# Patient Record
Sex: Male | Born: 1950 | Race: Black or African American | Hispanic: No | Marital: Single | Smoking: Former smoker
Health system: Southern US, Community
[De-identification: ages and names within clinical notes are randomized; demographics above are authoritative.]

## PROBLEM LIST (undated history)

## (undated) DIAGNOSIS — N189 Chronic kidney disease, unspecified: Secondary | ICD-10-CM

## (undated) DIAGNOSIS — A419 Sepsis, unspecified organism: Secondary | ICD-10-CM

## (undated) DIAGNOSIS — I10 Essential (primary) hypertension: Secondary | ICD-10-CM

## (undated) DIAGNOSIS — M1990 Unspecified osteoarthritis, unspecified site: Secondary | ICD-10-CM

## (undated) DIAGNOSIS — I82409 Acute embolism and thrombosis of unspecified deep veins of unspecified lower extremity: Secondary | ICD-10-CM

## (undated) DIAGNOSIS — R32 Unspecified urinary incontinence: Secondary | ICD-10-CM

## (undated) DIAGNOSIS — E785 Hyperlipidemia, unspecified: Secondary | ICD-10-CM

## (undated) DIAGNOSIS — F0390 Unspecified dementia without behavioral disturbance: Secondary | ICD-10-CM

## (undated) DIAGNOSIS — R0600 Dyspnea, unspecified: Secondary | ICD-10-CM

## (undated) DIAGNOSIS — E119 Type 2 diabetes mellitus without complications: Secondary | ICD-10-CM

## (undated) DIAGNOSIS — I739 Peripheral vascular disease, unspecified: Secondary | ICD-10-CM

## (undated) HISTORY — PX: EYE SURGERY: SHX253

## (undated) HISTORY — PX: TONSILLECTOMY: SUR1361

---

## 1996-09-01 HISTORY — PX: CIRCUMCISION: SUR203

## 2007-02-19 DIAGNOSIS — I10 Essential (primary) hypertension: Secondary | ICD-10-CM | POA: Insufficient documentation

## 2010-12-18 DIAGNOSIS — E669 Obesity, unspecified: Secondary | ICD-10-CM | POA: Insufficient documentation

## 2013-03-10 ENCOUNTER — Ambulatory Visit: Payer: Self-pay | Admitting: Ophthalmology

## 2013-04-04 ENCOUNTER — Ambulatory Visit: Payer: Self-pay | Admitting: Ophthalmology

## 2013-04-04 LAB — BASIC METABOLIC PANEL
Chloride: 108 mmol/L — ABNORMAL HIGH (ref 98–107)
Co2: 30 mmol/L (ref 21–32)
EGFR (African American): >60
Glucose: 98 mg/dL (ref 65–99)
Osmolality: 282 (ref 275–301)
Potassium: 4.1 mmol/L (ref 3.5–5.1)
Sodium: 140 mmol/L (ref 136–145)

## 2013-04-06 ENCOUNTER — Ambulatory Visit: Payer: Self-pay | Admitting: Ophthalmology

## 2014-07-20 DIAGNOSIS — N529 Male erectile dysfunction, unspecified: Secondary | ICD-10-CM | POA: Insufficient documentation

## 2014-12-22 NOTE — Op Note (Signed)
PATIENT NAME:  [name], [name] MR#:  [id number] DATE OF BIRTH:  Aug 12, 1951  DATE OF PROCEDURE:  04/06/2013  PROCEDURES PERFORMED:  1. Pars plana vitrectomy of the left eye.  2. Tractional retinal detachment repair of the left eye.  3. Gas exchange of the left eye.  4. Panretinal photocoagulation of the left eye.   PREOPERATIVE DIAGNOSES: 1. Severe tractional retinal detachment.  2. Proliferative diabetic retinopathy.   POSTOPERATIVE DIAGNOSES: 1. Severe tractional retinal detachment.  2. Proliferative diabetic retinopathy.   PRIMARY SURGEON: Jerry Felling. Carmyn Hamm, [name]  ANESTHESIA: General anesthesia with a supplemental retrobulbar block.   COMPLICATIONS: None.   INDICATIONS FOR PROCEDURE: The patient presented to my office with decreasing vision in the left eye. Examination revealed a tractional retinal detachment that encompassed the inferior 25% to 30% of his retina, passed the inferior arcade, threatening the fovea. There was extensive proliferative vitreoretinopathy with a shallow tractional retinal detachment superiorly as well. Risks, benefits and alternatives of the above procedure were discussed, and the patient wished to proceed. The patient delayed surgery for several weeks secondary to financial issues.   DETAILS OF PROCEDURE: After informed consent was obtained, the patient was brought into the operative suite at [facility]. The patient was placed in supine position and was induced by the anesthesia team without complication, and a retrobulbar block was performed on the left eye by the primary surgeon without complication. The left eye was prepped and draped in sterile manner. After lid speculum was inserted, a 23-gauge trocar was placed inferotemporally through displaced conjunctiva in an oblique fashion 4 mm beyond the limbus. The infusion cannula was turned on and inserted through the trocar and secured into position with Steri-Strips. Two more trocars  were placed in a similar fashion superotemporally and superonasally. The vitreous cutter and light pipe were introduced into the eye, and a core vitrectomy was performed. The retina was examined, and the tractional retinal detachment was noted to have gone through his fovea. The proliferative membranes were dissected and slowly separated from each other and peeled away. During the course of peeling, it was noted that the proliferative membranes were extremely adherent and incorporated with the retina and very large. Multiple small retinal tears were associated as the proliferative membranes were peeled away and dissected away from the rest of the vitreous body. The vitreous was incredibly adherent and caused further elevation of the retina in the periphery as the vitreous was peeled away. Vitreous was trimmed 360 degrees to the vitreous base. An air-fluid exchange was performed through one of the retinal breaks, and the retina completely flattened for 360 degrees except in the extreme posterior, with some remnant tractional fluid that was very gel-like. Endolaser was introduced, and 4 rows of laser were placed around each of the retinal tears. Endolaser was then carried for 360 degrees in a panretinal photocoagulation pattern, leaving the posterior untouched. Once the laser was completed, any remnant fluid was removed. The 15% C3F8 was used as an Systems developer, and the trocars were removed. The wounds were closed using plain 6-0 gut. The eye was pressurized with C3F8 to 15 mmHg. Dexamethasone 5 mg was given into the inferior fornix. The lid speculum was removed, and the eye was cleaned. TobraDex was placed in the eye, and a patch and shield were placed over the eye. The patient was taken to postanesthesia care with instruction to remain face down.    ____________________________ Jerry Felling. Champ Mungo, [name] mfa:OSi D: 04/06/2013 09:16:20 ET T: 04/06/2013  09:28:57 ET JOB#: [id number]  cc: Jerry FellingMatthew F.  Champ MungoAppenzeller, [name], <Dictator> [name] ELECTRONICALLY SIGNED 04/20/2013 8:48

## 2016-07-21 ENCOUNTER — Encounter: Admission: RE | Payer: Self-pay | Source: Ambulatory Visit

## 2016-07-21 ENCOUNTER — Ambulatory Visit: Admission: RE | Admit: 2016-07-21 | Payer: Medicare HMO | Source: Ambulatory Visit | Admitting: Gastroenterology

## 2016-07-21 SURGERY — COLONOSCOPY WITH PROPOFOL
Anesthesia: General

## 2016-10-03 ENCOUNTER — Inpatient Hospital Stay: Payer: Medicare HMO | Admitting: Certified Registered Nurse Anesthetist

## 2016-10-03 ENCOUNTER — Inpatient Hospital Stay
Admission: EM | Admit: 2016-10-03 | Discharge: 2016-10-13 | DRG: 853 | Disposition: A | Discharge status: Skilled Nursing Facility | Payer: Medicare HMO | Attending: Internal Medicine | Admitting: Internal Medicine

## 2016-10-03 ENCOUNTER — Encounter
Admission: EM | Disposition: A | Discharge status: Skilled Nursing Facility | Payer: Self-pay | Source: Home / Self Care | Attending: Internal Medicine

## 2016-10-03 ENCOUNTER — Emergency Department: Payer: Medicare HMO

## 2016-10-03 DIAGNOSIS — I129 Hypertensive chronic kidney disease with stage 1 through stage 4 chronic kidney disease, or unspecified chronic kidney disease: Secondary | ICD-10-CM | POA: Diagnosis present

## 2016-10-03 DIAGNOSIS — L03115 Cellulitis of right lower limb: Secondary | ICD-10-CM | POA: Diagnosis present

## 2016-10-03 DIAGNOSIS — Z833 Family history of diabetes mellitus: Secondary | ICD-10-CM

## 2016-10-03 DIAGNOSIS — Z7982 Long term (current) use of aspirin: Secondary | ICD-10-CM | POA: Diagnosis not present

## 2016-10-03 DIAGNOSIS — L0390 Cellulitis, unspecified: Secondary | ICD-10-CM

## 2016-10-03 DIAGNOSIS — E1122 Type 2 diabetes mellitus with diabetic chronic kidney disease: Secondary | ICD-10-CM | POA: Diagnosis present

## 2016-10-03 DIAGNOSIS — T445X5A Adverse effect of predominantly beta-adrenoreceptor agonists, initial encounter: Secondary | ICD-10-CM | POA: Diagnosis present

## 2016-10-03 DIAGNOSIS — A419 Sepsis, unspecified organism: Principal | ICD-10-CM | POA: Diagnosis present

## 2016-10-03 DIAGNOSIS — N183 Chronic kidney disease, stage 3 (moderate): Secondary | ICD-10-CM | POA: Diagnosis present

## 2016-10-03 DIAGNOSIS — E7800 Pure hypercholesterolemia, unspecified: Secondary | ICD-10-CM | POA: Diagnosis present

## 2016-10-03 DIAGNOSIS — Z881 Allergy status to other antibiotic agents status: Secondary | ICD-10-CM

## 2016-10-03 DIAGNOSIS — A480 Gas gangrene: Secondary | ICD-10-CM | POA: Diagnosis present

## 2016-10-03 DIAGNOSIS — E11628 Type 2 diabetes mellitus with other skin complications: Secondary | ICD-10-CM | POA: Diagnosis present

## 2016-10-03 DIAGNOSIS — Z7984 Long term (current) use of oral hypoglycemic drugs: Secondary | ICD-10-CM

## 2016-10-03 DIAGNOSIS — I472 Ventricular tachycardia: Secondary | ICD-10-CM | POA: Diagnosis present

## 2016-10-03 DIAGNOSIS — E872 Acidosis: Secondary | ICD-10-CM | POA: Diagnosis present

## 2016-10-03 DIAGNOSIS — Z87891 Personal history of nicotine dependence: Secondary | ICD-10-CM

## 2016-10-03 DIAGNOSIS — T68XXXA Hypothermia, initial encounter: Secondary | ICD-10-CM | POA: Diagnosis present

## 2016-10-03 DIAGNOSIS — E1140 Type 2 diabetes mellitus with diabetic neuropathy, unspecified: Secondary | ICD-10-CM | POA: Diagnosis present

## 2016-10-03 DIAGNOSIS — R81 Glycosuria: Secondary | ICD-10-CM | POA: Diagnosis present

## 2016-10-03 DIAGNOSIS — R21 Rash and other nonspecific skin eruption: Secondary | ICD-10-CM | POA: Diagnosis not present

## 2016-10-03 DIAGNOSIS — T39395A Adverse effect of other nonsteroidal anti-inflammatory drugs [NSAID], initial encounter: Secondary | ICD-10-CM | POA: Diagnosis present

## 2016-10-03 DIAGNOSIS — Z79899 Other long term (current) drug therapy: Secondary | ICD-10-CM | POA: Diagnosis not present

## 2016-10-03 DIAGNOSIS — N170 Acute kidney failure with tubular necrosis: Secondary | ICD-10-CM | POA: Diagnosis present

## 2016-10-03 DIAGNOSIS — Z791 Long term (current) use of non-steroidal anti-inflammatories (NSAID): Secondary | ICD-10-CM | POA: Diagnosis not present

## 2016-10-03 DIAGNOSIS — N182 Chronic kidney disease, stage 2 (mild): Secondary | ICD-10-CM | POA: Diagnosis present

## 2016-10-03 DIAGNOSIS — R6521 Severe sepsis with septic shock: Secondary | ICD-10-CM | POA: Diagnosis present

## 2016-10-03 DIAGNOSIS — E1152 Type 2 diabetes mellitus with diabetic peripheral angiopathy with gangrene: Secondary | ICD-10-CM | POA: Diagnosis present

## 2016-10-03 DIAGNOSIS — L8990 Pressure ulcer of unspecified site, unspecified stage: Secondary | ICD-10-CM | POA: Insufficient documentation

## 2016-10-03 DIAGNOSIS — I70261 Atherosclerosis of native arteries of extremities with gangrene, right leg: Secondary | ICD-10-CM | POA: Diagnosis not present

## 2016-10-03 DIAGNOSIS — L97519 Non-pressure chronic ulcer of other part of right foot with unspecified severity: Secondary | ICD-10-CM | POA: Diagnosis not present

## 2016-10-03 DIAGNOSIS — E11621 Type 2 diabetes mellitus with foot ulcer: Secondary | ICD-10-CM | POA: Diagnosis not present

## 2016-10-03 DIAGNOSIS — N179 Acute kidney failure, unspecified: Secondary | ICD-10-CM

## 2016-10-03 DIAGNOSIS — I70235 Atherosclerosis of native arteries of right leg with ulceration of other part of foot: Secondary | ICD-10-CM | POA: Diagnosis not present

## 2016-10-03 HISTORY — DX: Type 2 diabetes mellitus without complications: E11.9

## 2016-10-03 HISTORY — DX: Essential (primary) hypertension: I10

## 2016-10-03 HISTORY — PX: IRRIGATION AND DEBRIDEMENT FOOT: SHX6602

## 2016-10-03 LAB — COMPREHENSIVE METABOLIC PANEL
ALT: 30 U/L (ref 17–63)
ANION GAP: 21 — AB (ref 5–15)
AST: 72 U/L — ABNORMAL HIGH (ref 15–41)
Albumin: 2.9 g/dL — ABNORMAL LOW (ref 3.5–5.0)
Alkaline Phosphatase: 71 U/L (ref 38–126)
BUN: 120 mg/dL — ABNORMAL HIGH (ref 6–20)
CALCIUM: 8.7 mg/dL — AB (ref 8.9–10.3)
CO2: 11 mmol/L — AB (ref 22–32)
Chloride: 103 mmol/L (ref 101–111)
Creatinine, Ser: 7.5 mg/dL — ABNORMAL HIGH (ref 0.61–1.24)
GFR calc non Af Amer: 7 mL/min — ABNORMAL LOW (ref >60)
GFR, EST AFRICAN AMERICAN: 8 mL/min — AB (ref >60)
Glucose, Bld: 168 mg/dL — ABNORMAL HIGH (ref 65–99)
Potassium: 5.3 mmol/L — ABNORMAL HIGH (ref 3.5–5.1)
Sodium: 135 mmol/L (ref 135–145)
Total Bilirubin: 1.1 mg/dL (ref 0.3–1.2)
Total Protein: 7.8 g/dL (ref 6.5–8.1)

## 2016-10-03 LAB — CBC WITH DIFFERENTIAL/PLATELET
BASOS PCT: 0 %
Basophils Absolute: 0 10*3/uL (ref 0–0.1)
Eosinophils Absolute: 0 10*3/uL (ref 0–0.7)
Eosinophils Relative: 0 %
HEMATOCRIT: 35.9 % — AB (ref 40.0–52.0)
HEMOGLOBIN: 12.1 g/dL — AB (ref 13.0–18.0)
LYMPHS ABS: 0.7 10*3/uL — AB (ref 1.0–3.6)
Lymphocytes Relative: 3 %
MCH: 30 pg (ref 26.0–34.0)
MCHC: 33.6 g/dL (ref 32.0–36.0)
MCV: 89.2 fL (ref 80.0–100.0)
MONOS PCT: 9 %
Monocytes Absolute: 1.9 10*3/uL — ABNORMAL HIGH (ref 0.2–1.0)
NEUTROS ABS: 18.1 10*3/uL — AB (ref 1.4–6.5)
NEUTROS PCT: 88 %
Platelets: 336 10*3/uL (ref 150–440)
RBC: 4.03 MIL/uL — ABNORMAL LOW (ref 4.40–5.90)
RDW: 15 % — ABNORMAL HIGH (ref 11.5–14.5)
WBC: 20.7 10*3/uL — AB (ref 3.8–10.6)

## 2016-10-03 LAB — URINALYSIS, ROUTINE W REFLEX MICROSCOPIC
BACTERIA UA: NONE SEEN
Bilirubin Urine: NEGATIVE
GLUCOSE, UA: 50 mg/dL — AB
KETONES UR: NEGATIVE mg/dL
LEUKOCYTES UA: NEGATIVE
NITRITE: NEGATIVE
PH: 5 (ref 5.0–8.0)
Protein, ur: 100 mg/dL — AB
SPECIFIC GRAVITY, URINE: 1.026 (ref 1.005–1.030)

## 2016-10-03 LAB — GLUCOSE, CAPILLARY
Glucose-Capillary: 208 mg/dL — ABNORMAL HIGH (ref 65–99)
Glucose-Capillary: 203 mg/dL — ABNORMAL HIGH (ref 65–99)

## 2016-10-03 LAB — TROPONIN I: Troponin I: 0.07 ng/mL (ref <0.03)

## 2016-10-03 LAB — LACTIC ACID, PLASMA
Lactic Acid, Venous: 4.5 mmol/L (ref 0.5–1.9)
Lactic Acid, Venous: 1.2 mmol/L (ref 0.5–1.9)

## 2016-10-03 LAB — MRSA PCR SCREENING: MRSA by PCR: NEGATIVE

## 2016-10-03 SURGERY — IRRIGATION AND DEBRIDEMENT FOOT
Anesthesia: General | Site: Foot | Laterality: Right | Wound class: Dirty or Infected

## 2016-10-03 MED ORDER — PNEUMOCOCCAL VAC POLYVALENT 25 MCG/0.5ML IJ INJ: 0.5000 mL | INJECTION | INTRAMUSCULAR | Status: DC

## 2016-10-03 MED ORDER — SODIUM CHLORIDE 0.9 % IV BOLUS (SEPSIS)
500.0000 mL | Freq: Once | INTRAVENOUS | Status: AC
  Administered 2016-10-03: 500 mL via INTRAVENOUS

## 2016-10-03 MED ORDER — SODIUM CHLORIDE 0.9 % IV SOLN
INTRAVENOUS | Status: DC
  Administered 2016-10-03: 21:00:00 via INTRAVENOUS
  Administered 2016-10-04: 125 mL/h via INTRAVENOUS

## 2016-10-03 MED ORDER — LIDOCAINE HCL (PF) 2 % IJ SOLN
INTRAMUSCULAR | Status: AC
  Filled 2016-10-03: qty 2

## 2016-10-03 MED ORDER — FENTANYL CITRATE (PF) 100 MCG/2ML IJ SOLN
INTRAMUSCULAR | Status: DC | PRN
  Administered 2016-10-03: 100 ug via INTRAVENOUS

## 2016-10-03 MED ORDER — OXYCODONE HCL 5 MG PO TABS
5.0000 mg | ORAL_TABLET | ORAL | Status: DC | PRN
  Administered 2016-10-04 – 2016-10-13 (×16): 5 mg via ORAL
  Filled 2016-10-03 (×18): qty 1

## 2016-10-03 MED ORDER — FENTANYL CITRATE (PF) 100 MCG/2ML IJ SOLN
INTRAMUSCULAR | Status: AC
  Filled 2016-10-03: qty 2

## 2016-10-03 MED ORDER — SODIUM CHLORIDE 0.9 % IV BOLUS (SEPSIS): 1000.0000 mL | Freq: Once | INTRAVENOUS | Status: DC

## 2016-10-03 MED ORDER — PHENYLEPHRINE HCL 10 MG/ML IJ SOLN
INTRAMUSCULAR | Status: DC | PRN
  Administered 2016-10-03 (×2): 200 ug via INTRAVENOUS

## 2016-10-03 MED ORDER — PROPOFOL 10 MG/ML IV BOLUS
INTRAVENOUS | Status: AC
  Filled 2016-10-03: qty 20

## 2016-10-03 MED ORDER — PROPOFOL 10 MG/ML IV BOLUS
INTRAVENOUS | Status: DC | PRN
Start: 2016-10-03 — End: 2016-10-03
  Administered 2016-10-03: 200 mg via INTRAVENOUS

## 2016-10-03 MED ORDER — ACETAMINOPHEN 325 MG PO TABS
650.0000 mg | ORAL_TABLET | Freq: Four times a day (QID) | ORAL | Status: DC | PRN
  Administered 2016-10-04 – 2016-10-10 (×2): 650 mg via ORAL
  Filled 2016-10-03 (×2): qty 2

## 2016-10-03 MED ORDER — HEPARIN SODIUM (PORCINE) 5000 UNIT/ML IJ SOLN
5000.0000 [IU] | Freq: Three times a day (TID) | INTRAMUSCULAR | Status: DC
  Administered 2016-10-03 – 2016-10-13 (×29): 5000 [IU] via SUBCUTANEOUS
  Filled 2016-10-03 (×29): qty 1

## 2016-10-03 MED ORDER — ONDANSETRON HCL 4 MG PO TABS: 4.0000 mg | ORAL_TABLET | Freq: Four times a day (QID) | ORAL | Status: DC | PRN

## 2016-10-03 MED ORDER — DEXAMETHASONE SODIUM PHOSPHATE 10 MG/ML IJ SOLN
INTRAMUSCULAR | Status: AC
  Filled 2016-10-03: qty 1

## 2016-10-03 MED ORDER — VANCOMYCIN HCL IN DEXTROSE 1-5 GM/200ML-% IV SOLN
1000.0000 mg | Freq: Once | INTRAVENOUS | Status: AC
  Administered 2016-10-03: 1000 mg via INTRAVENOUS
  Filled 2016-10-03: qty 200

## 2016-10-03 MED ORDER — ATORVASTATIN CALCIUM 20 MG PO TABS
20.0000 mg | ORAL_TABLET | Freq: Every day | ORAL | Status: DC
  Administered 2016-10-03 – 2016-10-13 (×11): 20 mg via ORAL
  Filled 2016-10-03 (×11): qty 1

## 2016-10-03 MED ORDER — VANCOMYCIN HCL 10 G IV SOLR
1500.0000 mg | INTRAVENOUS | Status: DC
  Filled 2016-10-03: qty 1500

## 2016-10-03 MED ORDER — FENTANYL CITRATE (PF) 100 MCG/2ML IJ SOLN: 25.0000 ug | INTRAMUSCULAR | Status: DC | PRN

## 2016-10-03 MED ORDER — MIDAZOLAM HCL 2 MG/2ML IJ SOLN
INTRAMUSCULAR | Status: AC
  Filled 2016-10-03: qty 2

## 2016-10-03 MED ORDER — ACETAMINOPHEN 650 MG RE SUPP: 650.0000 mg | Freq: Four times a day (QID) | RECTAL | Status: DC | PRN

## 2016-10-03 MED ORDER — BUPIVACAINE HCL 0.5 % IJ SOLN
INTRAMUSCULAR | Status: DC | PRN
  Administered 2016-10-03: 30 mL

## 2016-10-03 MED ORDER — MIDAZOLAM HCL 2 MG/2ML IJ SOLN
INTRAMUSCULAR | Status: DC | PRN
  Administered 2016-10-03: 2 mg via INTRAVENOUS

## 2016-10-03 MED ORDER — SODIUM CHLORIDE 0.9 % IV BOLUS (SEPSIS): 500.0000 mL | Freq: Once | INTRAVENOUS | Status: DC

## 2016-10-03 MED ORDER — PIPERACILLIN-TAZOBACTAM 3.375 G IVPB 30 MIN
3.3750 g | Freq: Once | INTRAVENOUS | Status: AC
  Administered 2016-10-03: 3.375 g via INTRAVENOUS
  Filled 2016-10-03: qty 50

## 2016-10-03 MED ORDER — INFLUENZA VAC SPLIT QUAD 0.5 ML IM SUSY
0.5000 mL | PREFILLED_SYRINGE | INTRAMUSCULAR | Status: DC
Start: 2016-10-04 — End: 2016-10-13
  Filled 2016-10-03: qty 0.5

## 2016-10-03 MED ORDER — EPHEDRINE SULFATE 50 MG/ML IJ SOLN
INTRAMUSCULAR | Status: DC | PRN
  Administered 2016-10-03 (×2): 15 mg via INTRAVENOUS

## 2016-10-03 MED ORDER — SODIUM CHLORIDE 0.9 % IV BOLUS (SEPSIS)
1000.0000 mL | Freq: Once | INTRAVENOUS | Status: AC
  Administered 2016-10-03: 1000 mL via INTRAVENOUS

## 2016-10-03 MED ORDER — CHLORHEXIDINE GLUCONATE 4 % EX LIQD: 60.0000 mL | Freq: Once | CUTANEOUS | Status: DC

## 2016-10-03 MED ORDER — SODIUM CHLORIDE 0.9 % IV BOLUS (SEPSIS)
500.0000 mL | Freq: Once | INTRAVENOUS | Status: AC
Start: 2016-10-03 — End: 2016-10-03
  Administered 2016-10-03: 500 mL via INTRAVENOUS

## 2016-10-03 MED ORDER — VASOPRESSIN 20 UNIT/ML IV SOLN
INTRAVENOUS | Status: DC | PRN
  Administered 2016-10-03 (×6): 2 [IU] via INTRAVENOUS

## 2016-10-03 MED ORDER — VASOPRESSIN 20 UNIT/ML IV SOLN
INTRAVENOUS | Status: AC
  Filled 2016-10-03: qty 1

## 2016-10-03 MED ORDER — DOCUSATE SODIUM 100 MG PO CAPS
100.0000 mg | ORAL_CAPSULE | Freq: Two times a day (BID) | ORAL | Status: DC
  Administered 2016-10-03 – 2016-10-13 (×18): 100 mg via ORAL
  Filled 2016-10-03 (×19): qty 1

## 2016-10-03 MED ORDER — POVIDONE-IODINE 10 % EX SWAB: 2.0000 "application " | Freq: Once | CUTANEOUS | Status: DC

## 2016-10-03 MED ORDER — LACTATED RINGERS IV SOLN
INTRAVENOUS | Status: DC | PRN
  Administered 2016-10-03: 17:00:00 via INTRAVENOUS

## 2016-10-03 MED ORDER — ONDANSETRON HCL 4 MG/2ML IJ SOLN
INTRAMUSCULAR | Status: AC
  Filled 2016-10-03: qty 2

## 2016-10-03 MED ORDER — LIDOCAINE HCL (CARDIAC) 20 MG/ML IV SOLN
INTRAVENOUS | Status: DC | PRN
  Administered 2016-10-03: 100 mg via INTRAVENOUS

## 2016-10-03 MED ORDER — BISACODYL 5 MG PO TBEC: 5.0000 mg | DELAYED_RELEASE_TABLET | Freq: Every day | ORAL | Status: DC | PRN

## 2016-10-03 MED ORDER — ASPIRIN EC 81 MG PO TBEC
81.0000 mg | DELAYED_RELEASE_TABLET | Freq: Every day | ORAL | Status: DC
  Administered 2016-10-04 – 2016-10-13 (×10): 81 mg via ORAL
  Filled 2016-10-03 (×10): qty 1

## 2016-10-03 MED ORDER — ONDANSETRON HCL 4 MG/2ML IJ SOLN: 4.0000 mg | Freq: Four times a day (QID) | INTRAMUSCULAR | Status: DC | PRN

## 2016-10-03 MED ORDER — TRAZODONE HCL 50 MG PO TABS
25.0000 mg | ORAL_TABLET | Freq: Every evening | ORAL | Status: DC | PRN
  Administered 2016-10-05: 25 mg via ORAL
  Filled 2016-10-03: qty 1

## 2016-10-03 MED ORDER — PIPERACILLIN-TAZOBACTAM 3.375 G IVPB
3.3750 g | Freq: Two times a day (BID) | INTRAVENOUS | Status: DC
  Administered 2016-10-03 – 2016-10-05 (×4): 3.375 g via INTRAVENOUS
  Filled 2016-10-03 (×4): qty 50

## 2016-10-03 MED ORDER — ONDANSETRON HCL 4 MG/2ML IJ SOLN: 4.0000 mg | Freq: Once | INTRAMUSCULAR | Status: DC | PRN

## 2016-10-03 MED ORDER — SODIUM CHLORIDE 0.9 % IV BOLUS (SEPSIS)
1000.0000 mL | Freq: Once | INTRAVENOUS | Status: AC
Start: 2016-10-03 — End: 2016-10-03
  Administered 2016-10-03: 1000 mL via INTRAVENOUS

## 2016-10-03 MED ORDER — ONDANSETRON HCL 4 MG/2ML IJ SOLN
INTRAMUSCULAR | Status: DC | PRN
  Administered 2016-10-03: 4 mg via INTRAVENOUS

## 2016-10-03 SURGICAL SUPPLY — 62 items
BANDAGE ACE 4X5 VEL STRL LF (GAUZE/BANDAGES/DRESSINGS) ×2 IMPLANT
BANDAGE STRETCH 3X4.1 STRL (GAUZE/BANDAGES/DRESSINGS) ×2 IMPLANT
BLADE OSC/SAGITTAL MD 5.5X18 (BLADE) IMPLANT
BLADE OSCILLATING/SAGITTAL (BLADE)
BLADE SW THK.38XMED LNG THN (BLADE) IMPLANT
BNDG COHESIVE 4X5 TAN STRL (GAUZE/BANDAGES/DRESSINGS) ×2 IMPLANT
BNDG COHESIVE 6X5 TAN STRL LF (GAUZE/BANDAGES/DRESSINGS) ×2 IMPLANT
BNDG ESMARK 4X12 TAN STRL LF (GAUZE/BANDAGES/DRESSINGS) ×2 IMPLANT
BNDG GAUZE 4.5X4.1 6PLY STRL (MISCELLANEOUS) ×2 IMPLANT
CANISTER SUCT 1200ML W/VALVE (MISCELLANEOUS) ×2 IMPLANT
CANISTER SUCT 3000ML (MISCELLANEOUS) ×2 IMPLANT
CUFF TOURN 18 STER (MISCELLANEOUS) ×2 IMPLANT
CUFF TOURN DUAL PL 12 NO SLV (MISCELLANEOUS) ×2 IMPLANT
DRAPE FLUOR MINI C-ARM 54X84 (DRAPES) IMPLANT
DRAPE XRAY CASSETTE 23X24 (DRAPES) IMPLANT
DRESSING ALLEVYN 4X4 (MISCELLANEOUS) IMPLANT
DURAPREP 26ML APPLICATOR (WOUND CARE) ×2 IMPLANT
ELECT REM PT RETURN 9FT ADLT (ELECTROSURGICAL) ×2
ELECTRODE REM PT RTRN 9FT ADLT (ELECTROSURGICAL) ×1 IMPLANT
GAUZE PACKING 1/4X5YD (GAUZE/BANDAGES/DRESSINGS) ×2 IMPLANT
GAUZE PACKING IODOFORM 1X5 (MISCELLANEOUS) ×2 IMPLANT
GAUZE PETRO XEROFOAM 1X8 (MISCELLANEOUS) ×2 IMPLANT
GAUZE SPONGE 4X4 12PLY STRL (GAUZE/BANDAGES/DRESSINGS) ×2 IMPLANT
GAUZE STRETCH 2X75IN STRL (MISCELLANEOUS) ×2 IMPLANT
GLOVE BIO SURGEON STRL SZ7.5 (GLOVE) ×2 IMPLANT
GLOVE INDICATOR 8.0 STRL GRN (GLOVE) ×2 IMPLANT
GOWN STRL REUS W/ TWL LRG LVL3 (GOWN DISPOSABLE) ×2 IMPLANT
GOWN STRL REUS W/TWL LRG LVL3 (GOWN DISPOSABLE) ×2
GOWN STRL REUS W/TWL MED LVL3 (GOWN DISPOSABLE) ×4 IMPLANT
HANDLE YANKAUER SUCT BULB TIP (MISCELLANEOUS) ×2 IMPLANT
HANDPIECE INTERPULSE COAX TIP (DISPOSABLE) ×1
HANDPIECE VERSAJET DEBRIDEMENT (MISCELLANEOUS) ×2 IMPLANT
IV NS 1000ML (IV SOLUTION) ×1
IV NS 1000ML BAXH (IV SOLUTION) ×1 IMPLANT
KIT DRSG VAC SLVR GRANUFM (MISCELLANEOUS) ×2 IMPLANT
KIT RM TURNOVER STRD PROC AR (KITS) ×2 IMPLANT
LABEL OR SOLS (LABEL) ×2 IMPLANT
NEEDLE FILTER BLUNT 18X 1/2SAF (NEEDLE) ×1
NEEDLE FILTER BLUNT 18X1 1/2 (NEEDLE) ×1 IMPLANT
NEEDLE HYPO 25X1 1.5 SAFETY (NEEDLE) ×2 IMPLANT
NS IRRIG 1000ML POUR BTL (IV SOLUTION) ×4 IMPLANT
NS IRRIG 500ML POUR BTL (IV SOLUTION) ×2 IMPLANT
PACK EXTREMITY ARMC (MISCELLANEOUS) ×2 IMPLANT
PAD ABD DERMACEA PRESS 5X9 (GAUZE/BANDAGES/DRESSINGS) ×2 IMPLANT
RASP SM TEAR CROSS CUT (RASP) IMPLANT
SET HNDPC FAN SPRY TIP SCT (DISPOSABLE) ×1 IMPLANT
SOL .9 NS 3000ML IRR  AL (IV SOLUTION) ×1
SOL .9 NS 3000ML IRR UROMATIC (IV SOLUTION) ×1 IMPLANT
SOL PREP PVP 2OZ (MISCELLANEOUS) ×2
SOLUTION PREP PVP 2OZ (MISCELLANEOUS) ×1 IMPLANT
STOCKINETTE IMPERVIOUS 9X36 MD (GAUZE/BANDAGES/DRESSINGS) ×2 IMPLANT
SUT ETHILON 2 0 FS 18 (SUTURE) ×4 IMPLANT
SUT ETHILON 4-0 (SUTURE) ×1
SUT ETHILON 4-0 FS2 18XMFL BLK (SUTURE) ×1
SUT VIC AB 3-0 SH 27 (SUTURE) ×1
SUT VIC AB 3-0 SH 27X BRD (SUTURE) ×1 IMPLANT
SUT VIC AB 4-0 FS2 27 (SUTURE) ×2 IMPLANT
SUTURE ETHLN 4-0 FS2 18XMF BLK (SUTURE) ×1 IMPLANT
SWAB CULTURE AMIES ANAERIB BLU (MISCELLANEOUS) ×2 IMPLANT
SYR 3ML LL SCALE MARK (SYRINGE) ×2 IMPLANT
SYRINGE 10CC LL (SYRINGE) ×4 IMPLANT
WND VAC CANISTER 500ML (MISCELLANEOUS) ×2 IMPLANT

## 2016-10-03 NOTE — Transfer of Care (Signed)
Immediate Anesthesia Transfer of Care Note  Patient: [name]  Procedure(s) Performed: Procedure(s): IRRIGATION AND DEBRIDEMENT FOOT (Right)  Patient Location: PACU  Anesthesia Type:General  Level of Consciousness: patient cooperative and responds to stimulation  Airway & Oxygen Therapy: Patient Spontanous Breathing and Patient connected to nasal cannula oxygen  Post-op Assessment: Report given to RN and Post -op Vital signs reviewed and stable  Post vital signs: Reviewed and stable  Last Vitals:  Vitals:   10/03/16 1826 10/03/16 1833  BP: (!) 81/41 98/61  Pulse: 88   Resp: (!) 23   Temp: 36.3 C     Last Pain:  Vitals:   10/03/16 1826  TempSrc: Tympanic  PainSc: Asleep         Complications: No apparent anesthesia complications

## 2016-10-03 NOTE — Op Note (Signed)
Operative note   Surgeon:Anetria Harwick    Assistant:none    Preop diagnosis:Gas producing infection with necrosis right foot    Postop diagnosis:Gas gangene right 5th toe and forefoot.    Procedure:I & D right foot and partial 5th ray amputation. Placement of wound VAC right foot    JYN:WGNFAOZEBL:minimal    Anesthesia:local and general    Hemostasis:none    Specimen:Gangrenous right 5th toe and tissue    Complications:none    Operative indications:[name] is an 66 y.o. that presents today for surgical intervention.  The risks/benefits/alternatives/complications have been discussed and consent has been given.    Procedure:  Patient was brought into the OR and placed on the operating table in thesupine position. After anesthesia was obtained theright lower extremity was prepped and draped in usual sterile fashion.  Attention was directed to the lateral aspect of the right foot where the fifth toe was noted to be a screw necrotic at this time with a large bullous lesion on the dorsal and plantar aspect of the foot. A lateral incision was made overlying the fifth MTPJ and fifth metatarsal. Full-thickness incision was taken down and there was noted to be severe necrotic tissue with a 5 necrotic tissue both dorsal and plantar to the fifth MTPJ into the fifth toe. The fifth toe was noted be completely gangrenous at this point and was resected from the surgical field in toto. Further dissection noted severe necrosis of the entire soft tissue surrounding the fifth metatarsal and an osteotomy was made into the base of the fifth metatarsal and approximate two thirds of the fifth metatarsal was then removed. There was still noted to be a large amount of necrotic tissue both dorsally and plantarly across the midfoot. The plantar necrosis carried almost to the first metatarsal. A versa jet was used to excisionally debride the severe necrotic tissue. Dorsally a large bullous had been removed and noted  pre-necrotic tissue of the skin with necrosis of the soft tissue overlying the lesser metatarsals to approximately the second metatarsal was noted. A versa jet was used to remove all of this necrotic tissue at this time. Minimal bleeding was noted and no tourniquet was used. The wound was flushed with a liter of saline with a bulb syringe. At this time necrotic tissue on the lateral aspect was resected and a silver impregnated wound VAC dressing was applied with the wound VAC under 125 mmHg with continuous pressure.    Patient tolerated the procedure and anesthesia well.  Was transported from the OR to the PACU with all vital signs stable and vascular status intact.   Patient is at very high risk of further amputation due to severe amount of necrotic tissue and fascia. Minimal circulation was noted.

## 2016-10-03 NOTE — Anesthesia Postprocedure Evaluation (Signed)
Anesthesia Post Note  Patient: [name]  Procedure(s) Performed: Procedure(s) (LRB): IRRIGATION AND DEBRIDEMENT FOOT (Right)  Patient location during evaluation: PACU Anesthesia Type: General Level of consciousness: awake and alert Pain management: pain level controlled Vital Signs Assessment: post-procedure vital signs reviewed and stable Respiratory status: spontaneous breathing, nonlabored ventilation, respiratory function stable and patient connected to nasal cannula oxygen Cardiovascular status: blood pressure returned to baseline and stable Postop Assessment: no signs of nausea or vomiting Anesthetic complications: no Comments: Patient with initial hypotension on arrival to the PACU, but patient was arousable and answered questions appropriately.  This was likely secondary to septicemia from his foot infection and recent debridement.  His hypotension did improve with fluid boluses of 500mL x2.  He was stable on discharge from the PACU.     Last Vitals:  Vitals:   10/03/16 2010 10/03/16 2020  BP: (!) 87/44 (!) 84/49  Pulse: 97 97  Resp: (!) 24 (!) 23  Temp:  36.4 C    Last Pain:  Vitals:   10/03/16 2020  TempSrc: Axillary  PainSc:                  Lenard SimmerAndrew Gracen Southwell

## 2016-10-03 NOTE — Anesthesia Procedure Notes (Signed)
Procedure Name: LMA Insertion Date/Time: 10/03/2016 5:25 PM Performed by: Marlana SalvageJESSUP, Toluwani Ruder Pre-anesthesia Checklist: Patient identified, Emergency Drugs available, Suction available, Patient being monitored and Timeout performed Patient Re-evaluated:Patient Re-evaluated prior to inductionOxygen Delivery Method: Circle system utilized Preoxygenation: Pre-oxygenation with 100% oxygen Intubation Type: IV induction Ventilation: Mask ventilation without difficulty LMA: LMA inserted LMA Size: 5.0 Number of attempts: 2 Placement Confirmation: positive ETCO2 and breath sounds checked- equal and bilateral Dental Injury: Teeth and Oropharynx as per pre-operative assessment

## 2016-10-03 NOTE — Consult Note (Signed)
ORTHOPAEDIC CONSULTATION  REQUESTING PHYSICIAN: Jerry AntisKevin Paduchowski, MD  Chief Complaint: right foot infection   HPI: [name] is a 66 y.o. male who complains of  Right foot infection..  Started hurting on Sunday. Seen by PCP Tuesday and started on po clindamycin.  Continued to worsened and presented to ED today.  Pt hypthermic with elevated WBC and lactic acid.  Xray showed gas on soft tissue.  Past Medical History:  Diagnosis Date  . Diabetes mellitus without complication (HCC)   . Hypertension    History reviewed. No pertinent surgical history. Social History   Social History  . Marital status: Single    Spouse name: N/A  . Number of children: N/A  . Years of education: N/A   Social History Main Topics  . Smoking status: Former Games developermoker  . Smokeless tobacco: Never Used  . Alcohol use No  . Drug use: Unknown  . Sexual activity: Not Asked   Other Topics Concern  . None   Social History Narrative  . None   Family History  Problem Relation Age of Onset  . Family history unknown: Yes   Not on File Prior to Admission medications   Medication Sig Start Date End Date Taking? Authorizing Provider  aspirin EC 81 MG tablet Take 81 mg by mouth daily.   Yes Historical Provider, MD  atorvastatin (LIPITOR) 20 MG tablet Take 20 mg by mouth daily.   Yes Historical Provider, MD  clindamycin (CLEOCIN) 300 MG capsule Take 300 mg by mouth 4 (four) times daily.   Yes Historical Provider, MD  lisinopril-hydrochlorothiazide (PRINZIDE,ZESTORETIC) 20-25 MG tablet Take 1 tablet by mouth daily.   Yes Historical Provider, MD  metFORMIN (GLUCOPHAGE) 1000 MG tablet Take 1,000 mg by mouth 2 (two) times daily with a meal.   Yes Historical Provider, MD   Dg Foot Complete Right  Result Date: 10/03/2016 CLINICAL DATA:  Swelling right foot. EXAM: RIGHT FOOT COMPLETE - 3+ VIEW COMPARISON:  No recent prior. FINDINGS: Prominent tissue swelling is noted particularly prominent over the lateral and  plantar aspect of the right foot. Associated soft tissue air is noted. These findings suggest soft tissue infection. No clearcut focal bony abnormality identified. If osteomyelitis is of clinical concern MRI can be obtained. No evidence of fracture or dislocation. Peripheral vascular calcification. IMPRESSION: 1. Extensive soft tissue swelling with prominent amount of air noted particularly along the lateral and plantar aspect of the right foot consistent with soft tissue infection. No focal underlying bony abnormality. If osteomyelitis is of clinical concern MRI should be considered. 2. Peripheral vascular disease . These results will be called to the ordering clinician or representative by the Radiologist Assistant, and communication documented in the PACS or zVision Dashboard. Electronically Signed   By: Maisie Fushomas  Register   On: 10/03/2016 13:50    Positive ROS: All other systems have been reviewed and were otherwise negative with the exception of those mentioned in the HPI and as above.  12 point ROS was performed.  Physical Exam: General: Alert and oriented.  No apparent distress.  Vascular:  Left foot:Dorsalis Pedis:  diminished Posterior Tibial:  absent  Right foot: Dorsalis Pedis:  absent Posterior Tibial:  absent  Neuro:absent protective sensation.  Derm:Large bullous lesion to right lateral foot with draining infection.    Ortho/MS: Diffuse edema to right foot.   Assessment: Gas producing diabetic foot infection  Plan: Plan for urgent I & D with gas producing infection. Pt has been NPO all day. D/W pt  and consent given. Medicine to perform admission.    [name], DPM Cell [phone]   10/03/2016 3:07 PM

## 2016-10-03 NOTE — Anesthesia Post-op Follow-up Note (Cosign Needed)
Anesthesia QCDR form completed.        

## 2016-10-03 NOTE — ED Provider Notes (Signed)
[facility] Emergency Department Provider Note  Time seen: 1:11 PM  I have reviewed the triage vital signs and the nursing notes.   HISTORY  Chief Complaint Weakness    HPI [name] is a 66 y.o. male with a past medical history of diabetes, hypertension, presents to the emergency department with generalized weakness. According to the patient he first noticed a red and swollen foot on Monday (4 days ago), he saw his physician on Tuesday was prescribed clindamycin. He has been taking clindamycin but states the swelling and redness continued to worsen. Today he states generalized weakness.Upon arrival the patient is somewhat somnolent but able to answer questions and follow commands appropriately. Patient has a core temperature of 94.5., Heart rate of 96.  Past Medical History:  Diagnosis Date  . Diabetes mellitus without complication (HCC)   . Hypertension     There are no active problems to display for this patient.   History reviewed. No pertinent surgical history.  Prior to Admission medications   Not on File    Not on File  Family History  Problem Relation Age of Onset  . Family history unknown: Yes    Social History Social History  Substance Use Topics  . Smoking status: Former Games developer  . Smokeless tobacco: Never Used  . Alcohol use No    Review of Systems Constitutional: Negative for fever.Positive for chills per patient Cardiovascular: Negative for chest pain. Respiratory: Negative for shortness of breath. Gastrointestinal: Negative for abdominal pain, vomiting and diarrhea Musculoskeletal: Right foot pain and redness and swelling Neurological: Negative for headaches, focal weakness or numbness. Positive for generalized weakness. 10-point ROS otherwise negative.  ____________________________________________   PHYSICAL EXAM:  VITAL SIGNS: ED Triage Vitals  Enc Vitals Group     BP 10/03/16 1301 118/82     Pulse Rate  10/03/16 1301 96     Resp 10/03/16 1301 20     Temp 10/03/16 1301 (!) 94.5 F (34.7 C)     Temp Source 10/03/16 1301 Rectal     SpO2 10/03/16 1301 100 %     Weight 10/03/16 1302 260 lb (117.9 kg)     Height 10/03/16 1302 5\' 11"  (1.803 m)     Head Circumference --      Peak Flow --      Pain Score --      Pain Loc --      Pain Edu? --      Excl. in GC? --     Constitutional: Alert and oriented. Well appearing and in no distress. Eyes: Normal exam ENT   Head: Normocephalic and atraumatic   Mouth/Throat: Mucous membranes are moist. Cardiovascular: Normal rate, regular rhythm. No murmur Respiratory: Normal respiratory effort without tachypnea nor retractions. Breath sounds are clear Gastrointestinal: Soft and nontender. No distention.  Musculoskeletal: Nontender with normal range of motion in all extremities. Neurologic:  Normal speech and language. No gross focal neurologic deficits  Skin:  Skin is warm, dry and intact.  Psychiatric: Mood and affect are normal. Speech and behavior are normal.   ____________________________________________    EKG  EKG reviewed and interpreted, so shows normal sinus rhythm at 92 bpm, narrow QRS, normal axis, largely normal intervals besides a slightly prolonged QTC of 500 ms, nonspecific but no concerning ST changes.  ____________________________________________    RADIOLOGY  X-ray shows significant swelling with gas.  ____________________________________________   INITIAL IMPRESSION / ASSESSMENT AND PLAN / ED COURSE  Pertinent labs & imaging  results that were available during my care of the patient were reviewed by me and considered in my medical decision making (see chart for details).  Patient presents to the emergent department with generalized weakness. Patient is found to be hypothermic 94 by rectal thermometer. Heart rate of 96 meeting Sirs criteria for likely right lower extremity cellulitis. Patient's right foot is  erythematous swollen with several areas of blistering and darkened skin especially to the fifth digit. Patient has lymphatic streaking. We'll obtain imaging to rule out gas. Patient will be treated for cellulitis with IV antibiotics, warm fluids, bear hugger. Currently awaiting lab results. Fully anticipated admission to the hospital for sepsis due to right lower extremity cellulitis.  X-ray concerning for a gas-forming organism. I discussed the patient with Dr. Ether GriffinsFowler who states he will be in later to see the patient and likely perform an incision and drainage. Patient's white blood cell count is 20,000, lactate greater than 4. Patient will receive 30 cc/kg of warm IV fluids. We will continue with bear hugger until normotensive.   CRITICAL CARE Performed by: Minna AntisPADUCHOWSKI, Camani Sesay   Total critical care time: 60 minutes  Critical care time was exclusive of separately billable procedures and treating other patients.  Critical care was necessary to treat or prevent imminent or life-threatening deterioration.  Critical care was time spent personally by me on the following activities: development of treatment plan with patient and/or surrogate as well as nursing, discussions with consultants, evaluation of patient's response to treatment, examination of patient, obtaining history from patient or surrogate, ordering and performing treatments and interventions, ordering and review of laboratory studies, ordering and review of radiographic studies, pulse oximetry and re-evaluation of patient's condition.   ____________________________________________   FINAL CLINICAL IMPRESSION(S) / ED DIAGNOSES  Sepsis Right lower extremity cellulitis    Minna AntisKevin Brallan Denio, MD 10/03/16 1433

## 2016-10-03 NOTE — ED Triage Notes (Signed)
Pt to ED from home via [facility] c/o weakness. Per EMS pt had a fall and was too weak to get up. Pt has hx of cellulitis to right foot and has started antibiotics. Per pt he has been feeling weak for 1 week. Pt alert and oriented in no acute distress at this time.

## 2016-10-03 NOTE — Progress Notes (Signed)
CH responded to an OR for an AD and prayer. CH educated Pt on AD. Pt will consider the material with the intent of completion tomorrow. 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 CH challenged him to seek opportunities to "teach" during his stay at [facility]. Pt seemed pleased with this prospect. CH provided the ministry of empathetic listening and prayer. CH is available for follow up as needed.    10/03/16 2100  Clinical Encounter Type  Visited With Patient  Visit Type Initial;Spiritual support;Critical Care  Referral From Nurse  Consult/Referral To Chaplain  Spiritual Encounters  Spiritual Needs Literature;Prayer  Advance Directives (For Healthcare)  Does Patient Have a Medical Advance Directive? No  Would patient like information on creating a medical advance directive? Yes (Inpatient - patient requests chaplain consult to create a medical advance directive)  Mental Health Advance Directives  Does Patient Have a Mental Health Advance Directive? No  Would patient like information on creating a mental health advance directive? No - Patient declined

## 2016-10-03 NOTE — Anesthesia Preprocedure Evaluation (Signed)
Anesthesia Evaluation  Patient identified by MRN, date of birth, ID band Patient awake    Reviewed: Allergy & Precautions, H&P , NPO status , Patient's Chart, lab work & pertinent test results, reviewed documented beta blocker date and time   History of Anesthesia Complications Negative for: history of anesthetic complications  Airway Mallampati: II  TM Distance: >3 FB Neck ROM: full    Dental  (+) Poor Dentition, Missing, Dental Advidsory Given   Pulmonary neg pulmonary ROS, former smoker,           Cardiovascular Exercise Tolerance: Good hypertension, (-) angina(-) CAD, (-) Past MI, (-) Cardiac Stents and (-) CABG (-) dysrhythmias (-) Valvular Problems/Murmurs     Neuro/Psych negative neurological ROS  negative psych ROS   GI/Hepatic negative GI ROS, Neg liver ROS,   Endo/Other  diabetes  Renal/GU negative Renal ROS  negative genitourinary   Musculoskeletal   Abdominal   Peds  Hematology negative hematology ROS (+)   Anesthesia Other Findings Past Medical History: No date: Diabetes mellitus without complication (HCC) No date: Hypertension   Reproductive/Obstetrics negative OB ROS                             Anesthesia Physical Anesthesia Plan  ASA: III  Anesthesia Plan: General   Post-op Pain Management:    Induction:   Airway Management Planned:   Additional Equipment:   Intra-op Plan:   Post-operative Plan:   Informed Consent: I have reviewed the patients History and Physical, chart, labs and discussed the procedure including the risks, benefits and alternatives for the proposed anesthesia with the patient or authorized representative who has indicated his/her understanding and acceptance.   Dental Advisory Given  Plan Discussed with: Anesthesiologist, CRNA and Surgeon  Anesthesia Plan Comments:         Anesthesia Quick Evaluation

## 2016-10-03 NOTE — Progress Notes (Signed)
Pharmacy Antibiotic Note  [name] is a 66 y.o. male admitted on 10/03/2016 with sepsis secondary to  cellulitis. Patient has been taking clindamycin PO outpatient with no improvement.  Pharmacy has been consulted for Vancomycin and Zosyn dosing.  According to ED note: X-Ray is concerning for gas forming organism.   Plan: DW: 92kg   Scr:7.5  Patient has already received Zosyn 3.375 iV x 1 dose and Vancomycin 1gm IV x1 dose.  Patients current CrCl calculated at 12.876ml/min and Scr is 7.5. Could not find any previous labs for the patient to see what baseline Scr was, but ED nurse says patient does not have PMH of ESRD or HD prior to admission.    Based on current renal function will give another dose of  Vancomycin 1gm  (total: vanc 2gm) and start patient on regimen of Vancomycin 1500mg  IV every 48hrs. Will monitor renal function daily and adjust dose as needed. May consider dosing based on random levels if needed.   Will start patient on Zosyn 3.375 IV EI every 12 hours. Monitor renal function daily and increase to every 8 hours if  CrCL improves to  >7420ml/min.   Possible I/D later today/tomorrow  Height: 5\' 11"  (180.3 cm) Weight: 260 lb (117.9 kg) IBW/kg (Calculated) : 75.3  Temp (24hrs), Avg:94.5 F (34.7 C), Min:94.5 F (34.7 C), Max:94.5 F (34.7 C)   Recent Labs Lab 10/03/16 1317  WBC 20.7*  CREATININE 7.50*  LATICACIDVEN 4.5*    Estimated Creatinine Clearance: 12.6 mL/min (by C-G formula based on SCr of 7.5 mg/dL (H)).    Not on File  Antimicrobials this admission: 2/2 vancomycin  >>  2/2 Zosyn >>   Dose adjustments this admission:  Microbiology results: 2/2 BCx: sent 2/2 UCx: sent  Thank you for allowing pharmacy to be a part of this patient's care.  Gardner CandleSheema M Vieva Brummitt, PharmD, BCPS Clinical Pharmacist 10/03/2016 2:58 PM

## 2016-10-03 NOTE — H&P (Signed)
[facility] [place] at [facility]   PATIENT NAME: [name]    MR#:  [id number]  DATE OF BIRTH:  [birth year]  DATE OF ADMISSION:  10/03/2016  PRIMARY CARE PHYSICIAN: No PCP Per Patient   REQUESTING/REFERRING PHYSICIAN: Dr. Minna Antis  CHIEF COMPLAINT: Generalized weakness    Chief Complaint  Patient presents with  . Weakness    HISTORY OF PRESENT ILLNESS:  [name]  is a 66 y.o. male with a known history ofHypertension, diabetes mellitus type 2 comes in because of generalized weakness, swelling on the bottom of the right foot. Patient has been having redness, swelling on the part of the right foot for about 4 days, went to primary doctor and was given clindamycin. Patient continued to have swelling, redness, pain,promped him to come to the emergency room. In the emergency room temperature 94.5, heart rate 96, hypotensive. Noted to have a blister on the dorsum of the right foot involving right little toe,swelling of therightfoot area of cellulitis on the plantar aspect of the right foot. X-ray of the right foot concerning for gas gangrene. Seen by podiatry Dr. Ether Griffins recommended incision and drainage this evening, starting him on a broad-spectrum antibiotics. Patient received vancomycin, Zosyn here.  PAST MEDICAL HISTORY:   Past Medical History:  Diagnosis Date  . Diabetes mellitus without complication (HCC)   . Hypertension     PAST SURGICAL HISTOIRY:  History reviewed. No pertinent surgical history.  SOCIAL HISTORY:   Social History  Substance Use Topics  . Smoking status: Former Games developer  . Smokeless tobacco: Never Used  . Alcohol use No    FAMILY HISTORY:   Family History  Problem Relation Age of Onset  . Family history unknown: Yes   Family history significant for mother has diabetes. DRUG ALLERGIES:  Not on File NKA  REVIEW OF SYSTEMS:  CONSTITUTIONAL: Hypothermia, generalized weakness.Marland Kitchen  EYES: No blurred or  double vision.  EARS, NOSE, AND THROAT: No tinnitus or ear pain.  RESPIRATORY: No cough, shortness of breath, wheezing or hemoptysis.  CARDIOVASCULAR: No chest pain, orthopnea, edema.  GASTROINTESTINAL: No nausea, vomiting, diarrhea or abdominal pain.  GENITOURINARY: No dysuria, hematuria.  ENDOCRINE: No polyuria, nocturia,  HEMATOLOGY: No anemia, easy bruising or bleeding SKIN: No rash or lesion. MUSCULOSKELETAL: Swelling, redness of the right foot, blister noted on the dorsum of the right foot. Area of swelling on the plantar aspect of the right foot.   NEUROLOGIC: No tingling, numbness, weakness.  PSYCHIATRY: No anxiety or depression.   MEDICATIONS AT HOME:   Prior to Admission medications   Medication Sig Start Date End Date Taking? Authorizing Provider  aspirin EC 81 MG tablet Take 81 mg by mouth daily.   Yes Historical Provider, MD  atorvastatin (LIPITOR) 20 MG tablet Take 20 mg by mouth daily.   Yes Historical Provider, MD  clindamycin (CLEOCIN) 300 MG capsule Take 300 mg by mouth 4 (four) times daily.   Yes Historical Provider, MD  lisinopril-hydrochlorothiazide (PRINZIDE,ZESTORETIC) 20-25 MG tablet Take 1 tablet by mouth daily.   Yes Historical Provider, MD  metFORMIN (GLUCOPHAGE) 1000 MG tablet Take 1,000 mg by mouth 2 (two) times daily with a meal.   Yes Historical Provider, MD      VITAL SIGNS:  Blood pressure 112/65, pulse 89, temperature (!) 94.5 F (34.7 C), temperature source Rectal, resp. rate 18, height 5\' 11"  (1.803 m), weight 117.9 kg (260 lb), SpO2 98 %.  PHYSICAL EXAMINATION:  GENERAL:  66 y.o.-year-old patient lying  in the bed with no acute distress.  EYES: Pupils equal, round, reactive to light and accommodation. No scleral icterus. Extraocular muscles intact.  HEENT: Head atraumatic, normocephalic. Oropharynx and nasopharynx clear.  NECK:  Supple, no jugular venous distention. No thyroid enlargement, no tenderness.  LUNGS: Normal breath sounds bilaterally,  no wheezing, rales,rhonchi or crepitation. No use of accessory muscles of respiration.  CARDIOVASCULAR: S1, S2 normal. No murmurs, rubs, or gallops.  ABDOMEN: Soft, nontender, nondistended. Bowel sounds present. No organomegaly or mass.  EXTREMITIES: No pedal edema, cyanosis, or clubbing.  NEUROLOGIC: Cranial nerves II through XII are intact. Muscle strength 5/5 in all extremities. Sensation intact. Gait not checked.  PSYCHIATRIC: The patient is alert and oriented x 3.  SKIN:swelling ,redeness of dorsum of right foot. Patient noted to have a blister, black discoloration of the right fifth digit, swelling and tenderness on the plantar aspect of the right foot.  LABORATORY PANEL:   CBC  Recent Labs Lab 10/03/16 1317  WBC 20.7*  HGB 12.1*  HCT 35.9*  PLT 336   ------------------------------------------------------------------------------------------------------------------  Chemistries   Recent Labs Lab 10/03/16 1317  NA 135  K 5.3*  CL 103  CO2 11*  GLUCOSE 168*  BUN 120*  CREATININE 7.50*  CALCIUM 8.7*  AST 72*  ALT 30  ALKPHOS 71  BILITOT 1.1   ------------------------------------------------------------------------------------------------------------------  Cardiac Enzymes  Recent Labs Lab 10/03/16 1317  TROPONINI 0.07*   ------------------------------------------------------------------------------------------------------------------  RADIOLOGY:  Dg Foot Complete Right  Result Date: 10/03/2016 CLINICAL DATA:  Swelling right foot. EXAM: RIGHT FOOT COMPLETE - 3+ VIEW COMPARISON:  No recent prior. FINDINGS: Prominent tissue swelling is noted particularly prominent over the lateral and plantar aspect of the right foot. Associated soft tissue air is noted. These findings suggest soft tissue infection. No clearcut focal bony abnormality identified. If osteomyelitis is of clinical concern MRI can be obtained. No evidence of fracture or dislocation. Peripheral vascular  calcification. IMPRESSION: 1. Extensive soft tissue swelling with prominent amount of air noted particularly along the lateral and plantar aspect of the right foot consistent with soft tissue infection. No focal underlying bony abnormality. If osteomyelitis is of clinical concern MRI should be considered. 2. Peripheral vascular disease . These results will be called to the ordering clinician or representative by the Radiologist Assistant, and communication documented in the PACS or zVision Dashboard. Electronically Signed   By: Maisie Fus  Register   On: 10/03/2016 13:50    EKG:   Orders placed or performed during the hospital encounter of 10/03/16  . ED EKG 12-Lead  . ED EKG 12-Lead  . EKG 12-Lead  . EKG 12-Lead    IMPRESSION AND PLAN:  #1.66 year old male patient with diabetes comes in with swelling and redness of the right foot. Failed outpatient therapy with clindamycin.  #1 septic shock secondary to gas gangrene: Continue aggressive hydration received 2 L of normal saline in the emergency room, patient still hypotensive started on aggressive IV hydration with normal saline at 1 50 cc an hour, admitted to telemetry. #2 right foot cellulitis with gas gangrene, admitted to telemetry, started on IV vancomycin, Zosyn, seen by podiatry patient will be scheduled for incision and debridement today night. #3 diabetes mellitus type 2 with anion gap of 20 but blood sugar is 168.npo,chem 7 in 4 hrs,. Continue aggressive hydration #4 acute renal failure creatinine of 7 hold metformin, continue aggressive hydration, monitor urine output, consult nephrology. #5 hypothermia secondary to septic shock: Patient has been having a, follow blood cultures, continue  IV antibiotics.    All the records are reviewed and case discussed with ED provider. Management plans discussed with the patient, family and they are in agreement.  CODE STATUS:full  TOTAL TIME TAKING CARE OF THIS PATIENT: 55 minutes.     Katha HammingKONIDENA,Naidelin Gugliotta M.D on 10/03/2016 at 3:16 PM  Between 7am to 6pm - Pager - [phone]  After 6pm go to [email or web address] - password [email or web address]  [facility]  Office  [phone]  CC: Primary care physician; No PCP Per Patient  Note: This dictation was prepared with Dragon dictation along with smaller phrase technology. Any transcriptional errors that result from this process are unintentional.

## 2016-10-04 ENCOUNTER — Inpatient Hospital Stay: Payer: Medicare HMO

## 2016-10-04 DIAGNOSIS — L8990 Pressure ulcer of unspecified site, unspecified stage: Secondary | ICD-10-CM | POA: Insufficient documentation

## 2016-10-04 DIAGNOSIS — I70235 Atherosclerosis of native arteries of right leg with ulceration of other part of foot: Secondary | ICD-10-CM

## 2016-10-04 LAB — BASIC METABOLIC PANEL
Anion gap: 13 (ref 5–15)
BUN: 103 mg/dL — ABNORMAL HIGH (ref 6–20)
CO2: 13 mmol/L — ABNORMAL LOW (ref 22–32)
Calcium: 7.7 mg/dL — ABNORMAL LOW (ref 8.9–10.3)
Chloride: 110 mmol/L (ref 101–111)
Creatinine, Ser: 5.93 mg/dL — ABNORMAL HIGH (ref 0.61–1.24)
GFR calc Af Amer: 10 mL/min — ABNORMAL LOW (ref >60)
GFR calc non Af Amer: 9 mL/min — ABNORMAL LOW (ref >60)
Glucose, Bld: 242 mg/dL — ABNORMAL HIGH (ref 65–99)
Potassium: 5 mmol/L (ref 3.5–5.1)
Sodium: 136 mmol/L (ref 135–145)

## 2016-10-04 LAB — CBC
HCT: 30.2 % — ABNORMAL LOW (ref 40.0–52.0)
Hemoglobin: 10 g/dL — ABNORMAL LOW (ref 13.0–18.0)
MCH: 29.4 pg (ref 26.0–34.0)
MCHC: 33.3 g/dL (ref 32.0–36.0)
MCV: 88.5 fL (ref 80.0–100.0)
Platelets: 306 10*3/uL (ref 150–440)
RBC: 3.41 MIL/uL — ABNORMAL LOW (ref 4.40–5.90)
RDW: 15 % — ABNORMAL HIGH (ref 11.5–14.5)
WBC: 16.8 10*3/uL — ABNORMAL HIGH (ref 3.8–10.6)

## 2016-10-04 LAB — URINE CULTURE: CULTURE: NO GROWTH

## 2016-10-04 MED ORDER — SODIUM BICARBONATE 8.4 % IV SOLN
INTRAVENOUS | Status: DC
  Administered 2016-10-04 – 2016-10-07 (×7): via INTRAVENOUS
  Filled 2016-10-04 (×10): qty 150

## 2016-10-04 MED ORDER — OXYCODONE HCL 5 MG PO TABS
5.0000 mg | ORAL_TABLET | Freq: Once | ORAL | Status: AC
  Administered 2016-10-04: 5 mg via ORAL

## 2016-10-04 NOTE — Consult Note (Signed)
[facility] CONSULT NOTE    Date: 10/04/2016                  Patient Name:  [name]  MRN: [id number]  DOB: Nov 27, 1950  Age / Sex: 66 y.o., male         PCP: No PCP Per Patient                 Service Requesting Consult: Hospitalist                 Reason for Consult: Acute renal failure            History of Present Illness: Patient is a 66 y.o. male with [name], hypertension, [name], [name], who was admitted to [facility] on 10/03/2016 for evaluation of right foot infection.  Over the past week the patient has had significant swelling and redness of the right foot. He has been seen by podiatry and has a wound VAC in place at the moment and is on broad-spectrum antibiotics. We have been consulted for the evaluation management of acute renal failure.  From prior records it appears that the patient has history of mild chronic kidney disease. His baseline creatinine is 1.1. He has seen nephrology in the remote past.  Upon presentation this time his BUN was 120 with a creatinine of 7.5 and EGFR of 8. With IV fluid hydration however his BUN is now down to 103 with a creatinine of 5.9 and EGFR of 10.  He has been taking at least 3 packets of BC powders daily.   Medications: Outpatient medications: Prescriptions Prior to Admission  Medication Sig Dispense Refill Last Dose  . aspirin EC 81 MG tablet Take 81 mg by mouth daily.   10/02/2016 at 0800  . atorvastatin (LIPITOR) 20 MG tablet Take 20 mg by mouth daily.   10/02/2016 at 2000  . clindamycin (CLEOCIN) 300 MG capsule Take 300 mg by mouth 4 (four) times daily.   10/02/2016 at 2000  . lisinopril-hydrochlorothiazide (PRINZIDE,ZESTORETIC) 20-25 MG tablet Take 1 tablet by mouth daily.   10/02/2016 at 0800  . metFORMIN (GLUCOPHAGE) 1000 MG tablet Take 1,000 mg by mouth 2 (two) times daily with a meal.   10/02/2016 at 1800     Current medications: Current Facility-Administered Medications  Medication Dose Route Frequency Provider Last Rate Last Dose  . 0.9 %  sodium chloride infusion   Intravenous Continuous Epifanio Lesches, MD 125 mL/hr at 10/04/16 0600    . acetaminophen (TYLENOL) tablet 650 mg  650 mg Oral Q6H PRN Epifanio Lesches, MD       Or  . acetaminophen (TYLENOL) suppository 650 mg  650 mg Rectal Q6H PRN Epifanio Lesches, MD      . aspirin EC tablet 81 mg  81 mg Oral Daily Epifanio Lesches, MD      . atorvastatin (LIPITOR) tablet 20 mg  20 mg Oral Daily Epifanio Lesches, MD   20 mg at 10/03/16 2214  . bisacodyl (DULCOLAX) EC tablet 5 mg  5 mg Oral Daily PRN Epifanio Lesches, MD      . docusate sodium (COLACE) capsule 100 mg  100 mg Oral BID Epifanio Lesches, MD   100 mg at 10/03/16 2214  . heparin injection 5,000 Units  5,000 Units Subcutaneous Q8H Epifanio Lesches, MD   5,000 Units at 10/04/16 7939  . Influenza  vac split quadrivalent PF (FLUARIX) injection 0.5 mL  0.5 mL Intramuscular Tomorrow-1000 Epifanio Lesches, MD      . ondansetron (ZOFRAN) tablet 4 mg  4 mg Oral Q6H PRN Epifanio Lesches, MD       Or  . ondansetron (ZOFRAN) injection 4 mg  4 mg Intravenous Q6H PRN Epifanio Lesches, MD      . oxyCODONE (Oxy IR/ROXICODONE) immediate release tablet 5 mg  5 mg Oral Q4H PRN Epifanio Lesches, MD      . piperacillin-tazobactam (ZOSYN) IVPB 3.375 g  3.375 g Intravenous Q12H Sheema M Hallaji, RPH   3.375 g at 10/03/16 2214  . pneumococcal 23 valent vaccine (PNU-IMMUNE) injection 0.5 mL  0.5 mL Intramuscular Tomorrow-1000 Epifanio Lesches, MD      . traZODone (DESYREL) tablet 25 mg  25 mg Oral QHS PRN Epifanio Lesches, MD      . Derrill Memo ON 10/05/2016] vancomycin (VANCOCIN) 1,500 mg in sodium chloride 0.9 % 500 mL IVPB  1,500 mg Intravenous Q48H Sheema M Hallaji, RPH          Allergies: Not on File    Past Medical History: Past Medical History:  Diagnosis  Date  . Diabetes mellitus without complication ([place])   . Hypertension      Past Surgical History: History reviewed. No pertinent surgical history.   Family History: Family History  Problem Relation Age of Onset  . Family history unknown: Yes     Social History: Social History   Social History  . Marital status: Single    Spouse name: N/A  . Number of children: N/A  . Years of education: N/A   Occupational History  . Not on file.   Social History Main Topics  . Smoking status: Former Research scientist (life sciences)  . Smokeless tobacco: Never Used  . Alcohol use No  . Drug use: Unknown  . Sexual activity: Not on file   Other Topics Concern  . Not on file   Social History Narrative  . No narrative on file     Review of Systems: As per HPI  Vital Signs: Blood pressure (!) 91/55, pulse 87, temperature 97.7 F (36.5 C), resp. rate (!) 24, height 5' 11"  (1.803 m), weight 117 kg (257 lb 15 oz), SpO2 96 %.  Weight trends: Filed Weights   10/03/16 1302 10/03/16 2020 10/04/16 0400  Weight: 117.9 kg (260 lb) 117 kg (257 lb 15 oz) 117 kg (257 lb 15 oz)    Physical Exam: General: NAD, resting in bed  Head: Normocephalic, atraumatic.  Eyes: Anicteric, EOMI  Nose: Mucous membranes moist, not inflammed, nonerythematous.  Throat: Oropharynx nonerythematous, no exudate appreciated.   Neck: Supple, trachea midline.  Lungs:  Normal respiratory effort. Clear to auscultation BL without crackles or wheezes.  Heart: RRR. S1 and S2 normal without gallop, murmur, or rubs.  Abdomen:  BS normoactive. Soft, Nondistended, non-tender.  No masses or organomegaly.  Extremities: 2+ b/l LE edema, wound vac in place in right foot  Neurologic: A&O X3, Motor strength is 5/5 in the all 4 extremities  Skin: No visible rashes, scars.    Lab results: Basic Metabolic Panel:  Recent Labs Lab 10/03/16 1317 10/04/16 0638  NA 135 136  K 5.3* 5.0  CL 103 110  CO2 11* 13*  GLUCOSE 168* 242*  BUN 120* 103*   CREATININE 7.50* 5.93*  CALCIUM 8.7* 7.7*    Liver Function Tests:  Recent Labs Lab 10/03/16 1317  AST 72*  ALT 30  ALKPHOS 71  BILITOT 1.1  PROT 7.8  ALBUMIN 2.9*   No results for input(s): LIPASE, AMYLASE in the last 168 hours. No results for input(s): AMMONIA in the last 168 hours.  CBC:  Recent Labs Lab 10/03/16 1317 10/04/16 0638  WBC 20.7* 16.8*  NEUTROABS 18.1*  --   HGB 12.1* 10.0*  HCT 35.9* 30.2*  MCV 89.2 88.5  PLT 336 306    Cardiac Enzymes:  Recent Labs Lab 10/03/16 1317  TROPONINI 0.07*    BNP: Invalid input(s): POCBNP  CBG:  Recent Labs Lab 10/03/16 1834 10/03/16 2049  GLUCAP 208* 55*    Microbiology: Results for orders placed or performed during the hospital encounter of 10/03/16  Blood Culture (routine x 2)     Status: None (Preliminary result)   Collection Time: 10/03/16  1:17 PM  Result Value Ref Range Status   Specimen Description BLOOD RIGHT FOREARM  Final   Special Requests BOTTLES DRAWN AEROBIC AND ANAEROBIC AER11ML ANA9ML  Final   Culture NO GROWTH < 24 HOURS  Final   Report Status PENDING  Incomplete  Blood Culture (routine x 2)     Status: None (Preliminary result)   Collection Time: 10/03/16  1:18 PM  Result Value Ref Range Status   Specimen Description BLOOD LEFT FOREARM  Final   Special Requests BOTTLES DRAWN AEROBIC AND ANAEROBIC AER5ML ANA4ML  Final   Culture NO GROWTH < 24 HOURS  Final   Report Status PENDING  Incomplete  Aerobic/Anaerobic Culture (surgical/deep wound)     Status: None (Preliminary result)   Collection Time: 10/03/16  5:38 PM  Result Value Ref Range Status   Specimen Description FOOT right foot abscess  Final   Special Requests NONE  Final   Gram Stain   Final    FEW WBC PRESENT, PREDOMINANTLY PMN ABUNDANT GRAM POSITIVE COCCI IN CLUSTERS ABUNDANT GRAM NEGATIVE RODS ABUNDANT GRAM POSITIVE COCCI IN PAIRS ABUNDANT GRAM POSITIVE RODS Performed at [facility], [place] [address]  [place], [place] [address]    Culture PENDING  Incomplete   Report Status PENDING  Incomplete  MRSA PCR Screening     Status: None   Collection Time: 10/03/16  8:52 PM  Result Value Ref Range Status   MRSA by PCR NEGATIVE NEGATIVE Final    Comment:        The GeneXpert MRSA Assay (FDA approved for NASAL specimens only), is one component of a comprehensive MRSA colonization surveillance program. It is not intended to diagnose MRSA infection nor to guide or monitor treatment for MRSA infections.     Coagulation Studies: No results for input(s): LABPROT, INR in the last 72 hours.  Urinalysis:  Recent Labs  10/03/16 1342  COLORURINE AMBER*  LABSPEC 1.026  PHURINE 5.0  GLUCOSEU 50*  HGBUR SMALL*  BILIRUBINUR NEGATIVE  KETONESUR NEGATIVE  PROTEINUR 100*  NITRITE NEGATIVE  LEUKOCYTESUR NEGATIVE      Imaging: Dg Foot Complete Right  Result Date: 10/03/2016 CLINICAL DATA:  Swelling right foot. EXAM: RIGHT FOOT COMPLETE - 3+ VIEW COMPARISON:  No recent prior. FINDINGS: Prominent tissue swelling is noted particularly prominent over the lateral and plantar aspect of the right foot. Associated soft tissue air is noted. These findings suggest soft tissue infection. No clearcut focal bony abnormality identified. If osteomyelitis is of clinical concern MRI can be obtained. No evidence of fracture or dislocation. Peripheral vascular calcification. IMPRESSION: 1. Extensive soft tissue swelling with prominent amount of air noted particularly along the lateral and plantar aspect  of the right foot consistent with soft tissue infection. No focal underlying bony abnormality. If osteomyelitis is of clinical concern MRI should be considered. 2. Peripheral vascular disease . These results will be called to the ordering clinician or representative by the Radiologist Assistant, and communication documented in the PACS or zVision Dashboard. Electronically Signed   By: Marcello Moores  Register   On: 10/03/2016 13:50       Assessment & Plan: Pt is a 66 y.o. male with [name], hypertension, [name], [name], who was admitted to [facility] on 10/03/2016 for evaluation of right foot infection.   1.  Acute renal failure, likely related to sepsis/BC powder ingestion/ACEi use 2.  CKD stage II, baseline Cr 1.1. 3.  Metabolic acidosis. 4.  Hypertension.  Plan:  We were asked to see the patient for evaluation management of acute renal failure. Patient has known chronic kidney disease with a baseline creatinine of 1.1. His acute renal failure now is likely attributable to right foot infection with sepsis, BC powder ingestion at home, and history of ACE inhibitor use. Patient undergoing IV fluid hydration. We will switch the patient to bicarbonate drip as his serum bicarbonate is quite low at 13. No urgent indication for dialysis at the moment however we may need to consider this if renal function were to worsen.

## 2016-10-04 NOTE — Progress Notes (Signed)
Pharmacy Antibiotic Note  [name] is a 66 y.o. male admitted on 10/03/2016 with sepsis secondary to  cellulitis. Patient has been taking clindamycin PO outpatient with no improvement.  Pharmacy has been consulted for Vancomycin and Zosyn dosing.  According to ED note: X-Ray is concerning for gas forming organism.   Plan: DW: 92kg   Scr:7.5  Patient has already received Zosyn 3.375 iV x 1 dose and Vancomycin 1gm IV x1 dose.  Patients current CrCl calculated at 12.536ml/min and Scr is 7.5. Could not find any previous labs for the patient to see what baseline Scr was, but ED nurse says patient does not have PMH of ESRD or HD prior to admission.    Based on current renal function will give another dose of  Vancomycin 1gm  (total: vanc 2gm) and start patient on regimen of Vancomycin 1500mg  IV every 48hrs. Will monitor renal function daily and adjust dose as needed. May consider dosing based on random levels if needed.   Will start patient on Zosyn 3.375 IV EI every 12 hours. Monitor renal function daily and increase to every 8 hours if  CrCL improves to  >1120ml/min.   2/3:  Scr improved to 5.93, Crcl 15.9 ml/min. ( however Crcl still < 20 ml/min ).  -Will continue EI Zosyn 3.375gm IV Q12h. -Due to unstable/changing renal fxn will dose per  Vancomycin Level, until Crcl > 20 ml/min and Scr more  stable. Vancomycin trough ordered for 2/4 at 1400 (~48  hrs after 1st Vancomycin dose).   Goal trough 15-7120mcg/ml.  Continue to Follow Scr/renal fxn.      Height: 5\' 11"  (180.3 cm) Weight: 257 lb 15 oz (117 kg) IBW/kg (Calculated) : 75.3  Temp (24hrs), Avg:97 F (36.1 C), Min:94.5 F (34.7 C), Max:97.7 F (36.5 C)   Recent Labs Lab 10/03/16 1317 10/03/16 2008 10/04/16 0638  WBC 20.7*  --  16.8*  CREATININE 7.50*  --  5.93*  LATICACIDVEN 4.5* 1.2  --     Estimated Creatinine Clearance: 15.9 mL/min (by C-G formula based on SCr of 5.93 mg/dL (H)).    Not on File  Antimicrobials this  admission: 2/2 vancomycin  >>  2/2 Zosyn >>   Dose adjustments this admission:  Microbiology results: 2/2 BCx: sent 2/2 UCx: sent 2/2 Wound cx foot= +GPC, +GNR, + GPR  Thank you for allowing pharmacy to be a part of this patient's care.  Angelique BlonderMerrill,Priti Consoli A, PharmD, BCPS Clinical Pharmacist 10/04/2016 8:50 AM

## 2016-10-04 NOTE — Consult Note (Signed)
Consult Note  Patient name: [name] MRN: [id number] DOB: 1951-08-21 Sex: male  Consulting Physician:  Dr. Ether Griffins  Reason for Consult:  Chief Complaint  Patient presents with  . Weakness    HISTORY OF PRESENT ILLNESS: 66 year old male admitted with weakness.  He was found to have a right foot infection for which he went to the OR with Dr. Ether Griffins yesterday for I&D  And partial right 5th ray amputation.  OR findings noted poor capillary bleeding and vascular was consulted.  The patient has a history of type 2 DM and HTN.  He is a former smoker.  He is on a statin for hypercholesterolemia.   PAST MEDICAL HISTORY:       Past Medical History:  Diagnosis Date  . Diabetes mellitus without complication (HCC)   . Hypertension     PAST SURGICAL HISTOIRY:  History reviewed. No pertinent surgical history.  SOCIAL HISTORY:       Social History  Substance Use Topics  . Smoking status: Former Games developer  . Smokeless tobacco: Never Used  . Alcohol use No    FAMILY HISTORY:        Family History  Problem Relation Age of Onset  . Family history unknown: Yes   Family history significant for mother has diabetes. DRUG ALLERGIES:  Not on File NKA  REVIEW OF SYSTEMS:  CONSTITUTIONAL: Hypothermia, generalized weakness.Marland Kitchen  EYES: No blurred or double vision.  EARS, NOSE, AND THROAT: No tinnitus or ear pain.  RESPIRATORY: No cough, shortness of breath, wheezing or hemoptysis.  CARDIOVASCULAR: No chest pain, orthopnea, edema.  GASTROINTESTINAL: No nausea, vomiting, diarrhea or abdominal pain.  GENITOURINARY: No dysuria, hematuria.  ENDOCRINE: No polyuria, nocturia,  HEMATOLOGY: No anemia, easy bruising or bleeding SKIN: No rash or lesion. MUSCULOSKELETAL: Swelling, redness of the right foot, blister noted on the dorsum of the right foot. Area of swelling on the plantar aspect of the right foot.   NEUROLOGIC: No tingling, numbness, weakness.    PSYCHIATRY: No anxiety or depression.   MEDICATIONS AT HOME:          Prior to Admission medications   Medication Sig Start Date End Date Taking? Authorizing Provider  aspirin EC 81 MG tablet Take 81 mg by mouth daily.   Yes Historical Provider, MD  atorvastatin (LIPITOR) 20 MG tablet Take 20 mg by mouth daily.   Yes Historical Provider, MD  clindamycin (CLEOCIN) 300 MG capsule Take 300 mg by mouth 4 (four) times daily.   Yes Historical Provider, MD  lisinopril-hydrochlorothiazide (PRINZIDE,ZESTORETIC) 20-25 MG tablet Take 1 tablet by mouth daily.   Yes Historical Provider, MD  metFORMIN (GLUCOPHAGE) 1000 MG tablet Take 1,000 mg by mouth 2 (two) times daily with a meal.   Yes Historical Provider, MD      PHYSICAL EXAMINATION: General: The patient appears their stated age.  Vital signs are BP (!) 91/55   Pulse 87   Temp 97.7 F (36.5 C)   Resp (!) 24   Ht 5\' 11"  (1.803 m)   Wt 257 lb 15 oz (117 kg)   SpO2 96%   BMI 35.98 kg/m  Pulmonary: Respirations are non-labored HEENT:  No gross abnormalities Abdomen: Soft and non-tender  Musculoskeletal: There are no major deformities.   Neurologic: No focal weakness or paresthesias are detected, Skin: There are no ulcer or rashes noted. Psychiatric: The patient has normal affect. Cardiovascular: There is a regular rate and rhythm.  He  has a palpable right femoral pulse but no palpable pedal pulses.  1+ edema to the right foot   Assessment:  Vascular insufficiency in the setting of a diabetic right foot infection Plan: The patient will need further workup including angiography to define his anatomy and to determine if he has any options for revascularization.  I discussed this with the patient and his sister.  Unfortunatly he is in acute renal failure and contrast for angiography will exacerbate his renal disease.  I would like to see his renal function improve before proceeding with angiography.  Dr. Gilda CreaseSchnier will follow up  on MOnday     V. Charlena CrossWells Keiasha Diep IV, M.D. [facility] Office: [phone] Pager:  [phone]

## 2016-10-04 NOTE — Progress Notes (Signed)
[facility] [place] [facility]   PATIENT NAME: [name]    MR#:  [id number]  DATE OF BIRTH:  Mar 21, 1951  SUBJECTIVE:admitted yesterday for septic shock secondary to gas gangreneof the right fifth toe. Status post is additional and drainage, fifth today amputation by podiatry yesterday evening. Patient also had wound VAC. Transfer to ICU for possible need for pressors. But patient never required vasopressors to help with blood pressure instead  continued on aggressive hydration. Patient says that he feels little better today.  CHIEF COMPLAINT:   Chief Complaint  Patient presents with  . Weakness    REVIEW OF SYSTEMS:    Review of Systems  Constitutional: Negative for chills and fever.  HENT: Negative for hearing loss.   Eyes: Negative for blurred vision, double vision and photophobia.  Respiratory: Negative for cough, hemoptysis and shortness of breath.   Cardiovascular: Negative for palpitations, orthopnea and leg swelling.  Gastrointestinal: Negative for abdominal pain, diarrhea and vomiting.  Genitourinary: Negative for dysuria and urgency.  Musculoskeletal: Negative for myalgias and neck pain.  Skin: Negative for rash.  Neurological: Negative for dizziness, focal weakness, seizures, weakness and headaches.  Psychiatric/Behavioral: Negative for memory loss. The patient does not have insomnia.    Wound vac present to right foot. Nutrition:  Tolerating Diet: Tolerating PT:      DRUG ALLERGIES:  Not on File  VITALS:  Blood pressure (!) 91/55, pulse 87, temperature 97.7 F (36.5 C), resp. rate (!) 24, height 5\' 11"  (1.803 m), weight 117 kg (257 lb 15 oz), SpO2 96 %.  PHYSICAL EXAMINATION:   Physical Exam  GENERAL:  66 y.o.-year-old patient lying in the bed with no acute distress.  EYES: Pupils equal, round, reactive to light and accommodation. No scleral icterus. Extraocular muscles intact.  HEENT: Head atraumatic, normocephalic.  Oropharynx and nasopharynx clear.  NECK:  Supple, no jugular venous distention. No thyroid enlargement, no tenderness.  LUNGS: Normal breath sounds bilaterally, no wheezing, rales,rhonchi or crepitation. No use of accessory muscles of respiration.  CARDIOVASCULAR: S1, S2 normal. No murmurs, rubs, or gallops.  ABDOMEN: Soft, nontender, nondistended. Bowel sounds present. No organomegaly or mass.  EXTREMITIES: No pedal edema, cyanosis, or clubbing.  NEUROLOGIC: Cranial nerves II through XII are intact. Muscle strength 5/5 in all extremities. Sensation intact. Gait not checked.  PSYCHIATRIC: The patient is alert and oriented x 3.  SKIN:s/p wound vac.to right foot.Marland Kitchen.    LABORATORY PANEL:   CBC  Recent Labs Lab 10/04/16 0638  WBC 16.8*  HGB 10.0*  HCT 30.2*  PLT 306   ------------------------------------------------------------------------------------------------------------------  Chemistries   Recent Labs Lab 10/03/16 1317 10/04/16 0638  NA 135 136  K 5.3* 5.0  CL 103 110  CO2 11* 13*  GLUCOSE 168* 242*  BUN 120* 103*  CREATININE 7.50* 5.93*  CALCIUM 8.7* 7.7*  AST 72*  --   ALT 30  --   ALKPHOS 71  --   BILITOT 1.1  --    ------------------------------------------------------------------------------------------------------------------  Cardiac Enzymes  Recent Labs Lab 10/03/16 1317  TROPONINI 0.07*   ------------------------------------------------------------------------------------------------------------------  RADIOLOGY:  Dg Foot Complete Right  Result Date: 10/03/2016 CLINICAL DATA:  Swelling right foot. EXAM: RIGHT FOOT COMPLETE - 3+ VIEW COMPARISON:  No recent prior. FINDINGS: Prominent tissue swelling is noted particularly prominent over the lateral and plantar aspect of the right foot. Associated soft tissue air is noted. These findings suggest soft tissue infection. No clearcut focal bony abnormality identified. If osteomyelitis is of clinical  concern  MRI can be obtained. No evidence of fracture or dislocation. Peripheral vascular calcification. IMPRESSION: 1. Extensive soft tissue swelling with prominent amount of air noted particularly along the lateral and plantar aspect of the right foot consistent with soft tissue infection. No focal underlying bony abnormality. If osteomyelitis is of clinical concern MRI should be considered. 2. Peripheral vascular disease . These results will be called to the ordering clinician or representative by the Radiologist Assistant, and communication documented in the PACS or zVision Dashboard. 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 WBC coming down. Patient never required pressors, transferred the patient to telemetry today continue IV hydration with bicarbonate. Continue  Wound vac,follow wound culture results,will consult ID   #2,acute renal failure secondary to septic shock, BC powder ingestion, ACE inhibitor's use: METABOLIC ACIDOSIS: BICARBONATE 13: PATIENT ON BICARBONATE DRIP AT THIS TIME.  3.poor circulation in right foot;vascular consulted'     All the records are reviewed and case discussed with Care Management/Social Workerr. Management plans discussed with the patient, family and they are in agreement.  CODE STATUS: full  TOTAL TIME TAKING CARE OF THIS PATIENT:35 minutes.   POSSIBLE D/C IN 3-4 DAYS, DEPENDING ON CLINICAL CONDITION.   Katha Hamming M.D on 10/04/2016 at 1:06 PM  Between 7am to 6pm - Pager - [phone]  After 6pm go to [email or web address] - password [email or web address] [facility]  [facility]  Office  [phone]  CC: Primary care  physician; No PCP Per Patient

## 2016-10-04 NOTE — Progress Notes (Signed)
Daily Progress Note   Subjective  - 1 Day Post-Op  F./u right foot I & D.  No pain.  Feels somewhat better  Objective Vitals:   10/04/16 0600 10/04/16 0700 10/04/16 0800 10/04/16 0900  BP: 112/60 (!) 99/52 (!) 100/56 (!) 91/55  Pulse: 86 82 83 87  Resp: 17 (!) 24 (!) 23 (!) 24  Temp:  97.7 F (36.5 C)    TempSrc:      SpO2: 94% 96% 96% 96%  Weight:      Height:        Physical Exam: Wound vac intact.  Moderate drainage.  Laboratory CBC    Component Value Date/Time   WBC 16.8 (H) 10/04/2016 0638   HGB 10.0 (L) 10/04/2016 0638   HCT 30.2 (L) 10/04/2016 0638   PLT 306 10/04/2016 0638    BMET    Component Value Date/Time   NA 136 10/04/2016 0638   NA 140 04/04/2013 1238   K 5.0 10/04/2016 0638   K 4.1 04/04/2013 1238   CL 110 10/04/2016 0638   CL 108 (H) 04/04/2013 1238   CO2 13 (L) 10/04/2016 0638   CO2 30 04/04/2013 1238   GLUCOSE 242 (H) 10/04/2016 0638   GLUCOSE 98 04/04/2013 1238   BUN 103 (H) 10/04/2016 0638   BUN 19 (H) 04/04/2013 1238   CREATININE 5.93 (H) 10/04/2016 0638   CREATININE 0.87 04/04/2013 1238   CALCIUM 7.7 (L) 10/04/2016 0638   CALCIUM 8.8 04/04/2013 1238   GFRNONAA 9 (L) 10/04/2016 0638   GFRNONAA >60 04/04/2013 1238   GFRAA 10 (L) 10/04/2016 0638   GFRAA >60 04/04/2013 1238    Assessment/Planning: 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  Removed 5th toe and metatarsal. Minimal bleeding during procedure.  D/W pt the extent of tissue damage and likely need for further debridment  Consulted vascular surgery.  Possible revascularization next week.  WBC improved.  Waiting on culture.  Will likely need long term IV antibiotics.  Recommend ID consult to follow for outpt IV antibiotics     Irean HongFowler, Londen Bok A  10/04/2016, 3:27 PM

## 2016-10-05 LAB — CBC
HCT: 30.4 % — ABNORMAL LOW (ref 40.0–52.0)
Hemoglobin: 10.1 g/dL — ABNORMAL LOW (ref 13.0–18.0)
MCH: 29 pg (ref 26.0–34.0)
MCHC: 33.2 g/dL (ref 32.0–36.0)
MCV: 87.2 fL (ref 80.0–100.0)
PLATELETS: 351 10*3/uL (ref 150–440)
RBC: 3.48 MIL/uL — ABNORMAL LOW (ref 4.40–5.90)
RDW: 15.1 % — AB (ref 11.5–14.5)
WBC: 11.8 10*3/uL — AB (ref 3.8–10.6)

## 2016-10-05 LAB — BASIC METABOLIC PANEL
Anion gap: 11 (ref 5–15)
BUN: 87 mg/dL — AB (ref 6–20)
CO2: 19 mmol/L — ABNORMAL LOW (ref 22–32)
CREATININE: 3.88 mg/dL — AB (ref 0.61–1.24)
Calcium: 8.3 mg/dL — ABNORMAL LOW (ref 8.9–10.3)
Chloride: 110 mmol/L (ref 101–111)
GFR calc Af Amer: 17 mL/min — ABNORMAL LOW (ref >60)
GFR, EST NON AFRICAN AMERICAN: 15 mL/min — AB (ref >60)
Glucose, Bld: 345 mg/dL — ABNORMAL HIGH (ref 65–99)
Potassium: 4.3 mmol/L (ref 3.5–5.1)
SODIUM: 140 mmol/L (ref 135–145)

## 2016-10-05 LAB — GLUCOSE, CAPILLARY
Glucose-Capillary: 322 mg/dL — ABNORMAL HIGH (ref 65–99)
Glucose-Capillary: 305 mg/dL — ABNORMAL HIGH (ref 65–99)
Glucose-Capillary: 263 mg/dL — ABNORMAL HIGH (ref 65–99)
Glucose-Capillary: 277 mg/dL — ABNORMAL HIGH (ref 65–99)

## 2016-10-05 LAB — VANCOMYCIN, TROUGH: Vancomycin Tr: 10 ug/mL — ABNORMAL LOW (ref 15–20)

## 2016-10-05 MED ORDER — INSULIN ASPART 100 UNIT/ML ~~LOC~~ SOLN
0.0000 [IU] | Freq: Three times a day (TID) | SUBCUTANEOUS | Status: DC
  Administered 2016-10-05: 7 [IU] via SUBCUTANEOUS
  Administered 2016-10-05: 5 [IU] via SUBCUTANEOUS
  Administered 2016-10-05: 7 [IU] via SUBCUTANEOUS
  Administered 2016-10-06: 5 [IU] via SUBCUTANEOUS
  Administered 2016-10-06: 3 [IU] via SUBCUTANEOUS
  Administered 2016-10-06 – 2016-10-07 (×2): 5 [IU] via SUBCUTANEOUS
  Administered 2016-10-07 (×2): 3 [IU] via SUBCUTANEOUS
  Administered 2016-10-08: 1 [IU] via SUBCUTANEOUS
  Administered 2016-10-08 – 2016-10-09 (×3): 2 [IU] via SUBCUTANEOUS
  Administered 2016-10-09 – 2016-10-13 (×8): 1 [IU] via SUBCUTANEOUS
  Filled 2016-10-05: qty 2
  Filled 2016-10-05: qty 7
  Filled 2016-10-05: qty 2
  Filled 2016-10-05: qty 3
  Filled 2016-10-05: qty 5
  Filled 2016-10-05: qty 3
  Filled 2016-10-05: qty 2
  Filled 2016-10-05: qty 1
  Filled 2016-10-05: qty 7
  Filled 2016-10-05: qty 3
  Filled 2016-10-05 (×2): qty 5
  Filled 2016-10-05: qty 2
  Filled 2016-10-05 (×3): qty 1
  Filled 2016-10-05: qty 5
  Filled 2016-10-05 (×4): qty 1

## 2016-10-05 MED ORDER — INSULIN GLARGINE 100 UNIT/ML ~~LOC~~ SOLN
10.0000 [IU] | Freq: Every day | SUBCUTANEOUS | Status: DC
  Administered 2016-10-05: 10 [IU] via SUBCUTANEOUS
  Filled 2016-10-05 (×2): qty 0.1

## 2016-10-05 MED ORDER — VANCOMYCIN HCL 10 G IV SOLR
1500.0000 mg | INTRAVENOUS | Status: DC
  Administered 2016-10-05 – 2016-10-07 (×2): 1500 mg via INTRAVENOUS
  Filled 2016-10-05 (×4): qty 1500

## 2016-10-05 MED ORDER — PIPERACILLIN-TAZOBACTAM 3.375 G IVPB
3.3750 g | Freq: Three times a day (TID) | INTRAVENOUS | Status: DC
  Administered 2016-10-05 – 2016-10-10 (×14): 3.375 g via INTRAVENOUS
  Filled 2016-10-05 (×14): qty 50

## 2016-10-05 MED ORDER — INSULIN ASPART 100 UNIT/ML ~~LOC~~ SOLN
0.0000 [IU] | Freq: Every day | SUBCUTANEOUS | Status: DC
  Administered 2016-10-05 – 2016-10-06 (×2): 3 [IU] via SUBCUTANEOUS
  Administered 2016-10-07: 2 [IU] via SUBCUTANEOUS
  Filled 2016-10-05: qty 8
  Filled 2016-10-05: qty 3
  Filled 2016-10-05: qty 2
  Filled 2016-10-05: qty 3

## 2016-10-05 NOTE — Progress Notes (Signed)
[facility] [place] [facility]   PATIENT NAME: [name]    MR#:  [id number]  DATE OF BIRTH:  12-31-1950  SUBJECTIVE:admitted yesterday for septic shock secondary to gas gangreneof the right fifth toe. Status post is additional and drainage, fifth today amputation by podiatry . Patient also had wound VAC. Complains of left   Leg  pain .  CHIEF COMPLAINT:   Chief Complaint  Patient presents with  . Weakness    REVIEW OF SYSTEMS:    Review of Systems  Constitutional: Negative for chills and fever.  HENT: Negative for hearing loss.   Eyes: Negative for blurred vision, double vision and photophobia.  Respiratory: Negative for cough, hemoptysis and shortness of breath.   Cardiovascular: Negative for palpitations, orthopnea and leg swelling.  Gastrointestinal: Negative for abdominal pain, diarrhea and vomiting.  Genitourinary: Negative for dysuria and urgency.  Musculoskeletal: Negative for myalgias and neck pain.  Skin: Negative for rash.  Neurological: Negative for dizziness, focal weakness, seizures, weakness and headaches.  Psychiatric/Behavioral: Negative for memory loss. The patient does not have insomnia.    Wound vac present to right foot.Complains of left leg pain. Nutrition:  Tolerating Diet: Tolerating PT:      DRUG ALLERGIES:  Not on File  VITALS:  Blood pressure (!) 90/58, pulse 73, temperature 98.3 F (36.8 C), temperature source Oral, resp. rate 18, height 5\' 11"  (1.803 m), weight 117.8 kg (259 lb 12.8 oz), SpO2 93 %.  PHYSICAL EXAMINATION:   Physical Exam  GENERAL:  66 y.o.-year-old patient lying in the bed with no acute distress.  EYES: Pupils equal, round, reactive to light and accommodation. No scleral icterus. Extraocular muscles intact.  HEENT: Head atraumatic, normocephalic. Oropharynx and nasopharynx clear.  NECK:  Supple, no jugular venous distention. No thyroid enlargement, no tenderness.  LUNGS: Normal breath  sounds bilaterally, no wheezing, rales,rhonchi or crepitation. No use of accessory muscles of respiration.  CARDIOVASCULAR: S1, S2 normal. No murmurs, rubs, or gallops.  ABDOMEN: Soft, nontender, nondistended. Bowel sounds present. No organomegaly or mass.  EXTREMITIES: No pedal edema, cyanosis, or clubbing.  NEUROLOGIC: Cranial nerves II through XII are intact. Muscle strength 5/5 in all extremities. Sensation intact. Gait not checked.  PSYCHIATRIC: The patient is alert and oriented x 3.  SKIN:s/p wound vac.to right foot.Marland Kitchen.    LABORATORY PANEL:   CBC  Recent Labs Lab 10/05/16 0549  WBC 11.8*  HGB 10.1*  HCT 30.4*  PLT 351   ------------------------------------------------------------------------------------------------------------------  Chemistries   Recent Labs Lab 10/03/16 1317  10/05/16 0549  NA 135  < > 140  K 5.3*  < > 4.3  CL 103  < > 110  CO2 11*  < > 19*  GLUCOSE 168*  < > 345*  BUN 120*  < > 87*  CREATININE 7.50*  < > 3.88*  CALCIUM 8.7*  < > 8.3*  AST 72*  --   --   ALT 30  --   --   ALKPHOS 71  --   --   BILITOT 1.1  --   --   < > = values in this interval not displayed. ------------------------------------------------------------------------------------------------------------------  Cardiac Enzymes  Recent Labs Lab 10/03/16 1317  TROPONINI 0.07*   ------------------------------------------------------------------------------------------------------------------  RADIOLOGY:  [place] Renal  Result Date: 10/04/2016 CLINICAL DATA:  Acute renal failure. EXAM: RENAL / URINARY TRACT ULTRASOUND COMPLETE COMPARISON:  None. FINDINGS: Right Kidney: Length: 12.9 cm. 4.8 cm simple cyst is seen in upper pole. Echogenicity within normal  limits. No mass or hydronephrosis visualized. Left Kidney: Length: 12.2 cm. Echogenicity within normal limits. No mass or hydronephrosis visualized. Bladder: Appears normal for degree of bladder distention. Bilateral ureteral jets are  noted. IMPRESSION: 4.8 cm simple right renal cyst.  No other renal abnormality seen. Electronically Signed   By: Lupita Raider, M.D.   On: 10/04/2016 14:22   Dg Foot Complete Right  Result Date: 10/03/2016 CLINICAL DATA:  Swelling right foot. EXAM: RIGHT FOOT COMPLETE - 3+ VIEW COMPARISON:  No recent prior. FINDINGS: Prominent tissue swelling is noted particularly prominent over the lateral and plantar aspect of the right foot. Associated soft tissue air is noted. These findings suggest soft tissue infection. No clearcut focal bony abnormality identified. If osteomyelitis is of clinical concern MRI can be obtained. No evidence of fracture or dislocation. Peripheral vascular calcification. IMPRESSION: 1. Extensive soft tissue swelling with prominent amount of air noted particularly along the lateral and plantar aspect of the right foot consistent with soft tissue infection. No focal underlying bony abnormality. If osteomyelitis is of clinical concern MRI should be considered. 2. Peripheral vascular disease . These results will be called to the ordering clinician or representative by the Radiologist Assistant, and communication documented in the PACS or zVision Dashboard. Electronically Signed   By: Maisie Fus  Register   On: 10/03/2016 13:50     ASSESSMENT AND PLAN:   Active Problems:   Septic shock (HCC)   Pressure injury of skin   #1 septic shock secondary to gas gangrene of the right fifth toe due to diabetes: Status post incision and drainage, fifth toe amputation by podiatry , clinically improving..continue IV antibiotics, IV hydration, . WBC coming down. continue IV hydration with bicarbonate. Continue  Wound vac,follow wound culture results,   #2,acute renal failure secondary to septic shock, BC powder ingestion, ACE inhibitor's use: METABOLIC ACIDOSIS: Improving function.bicarb  improved from 13-19.   3.poor circulation in right foot;vascular consulted;possible  angiogram or next week once  her kidney function improves.  GI and DVT prophylaxis; 4.DMIIL:high BG;add Levemir and continue SSI   All the records are reviewed and case discussed with Care Management/Social Workerr. Management plans discussed with the patient, family and they are in agreement.  CODE STATUS: full  TOTAL TIME TAKING CARE OF THIS PATIENT:35 minutes.   POSSIBLE D/C IN 3-4 DAYS, DEPENDING ON CLINICAL CONDITION.   Katha Hamming M.D on 10/05/2016 at 11:58 AM  Between 7am to 6pm - Pager - [phone]  After 6pm go to [email or web address] - password [email or web address] [facility]  [place] [facility]  Office  [phone]  CC: Primary care physician; No PCP Per Patient

## 2016-10-05 NOTE — Progress Notes (Signed)
Pharmacy Antibiotic Note  [name] is a 66 y.o. male admitted on 10/03/2016 with sepsis secondary to  cellulitis. Patient has been taking clindamycin PO outpatient with no improvement.  Pharmacy has been consulted for Vancomycin and Zosyn dosing.  According to ED note: X-Ray is concerning for gas forming organism.   Plan: DW: 92kg   Scr:7.5  Patient has already received Zosyn 3.375 iV x 1 dose and Vancomycin 1gm IV x1 dose.  Patients current CrCl calculated at 12.316ml/min and Scr is 7.5. Could not find any previous labs for the patient to see what baseline Scr was, but ED nurse says patient does not have PMH of ESRD or HD prior to admission.    Based on current renal function will give another dose of  Vancomycin 1gm  (total: vanc 2gm) and start patient on regimen of Vancomycin 1500mg  IV every 48hrs. Will monitor renal function daily and adjust dose as needed. May consider dosing based on random levels if needed.   Will start patient on Zosyn 3.375 IV EI every 12 hours. Monitor renal function daily and increase to every 8 hours if  CrCL improves to  >320ml/min.   2/3:  Scr improved to 5.93, Crcl 15.9 ml/min. ( however Crcl still < 20 ml/min ).  -Will continue EI Zosyn 3.375gm IV Q12h. -Due to unstable/changing renal fxn will dose per Vancomycin Level, until Crcl > 20 ml/min and Scr more stable. Vancomycin trough ordered for 2/4 at 1400 (~48 hrs after 1st Vancomycin dose).  Goal trough 15-6220mcg/ml.  Continue to Follow Scr/renal fxn.  2/4: Scr:7.5 > 5.93 > 3.88.  Crcl 24.4 ml/min. Will transition Zosyn to 3.375gm EI q8h for improved renal fxn.       Height: 5\' 11"  (180.3 cm) Weight: 259 lb 12.8 oz (117.8 kg) IBW/kg (Calculated) : 75.3  Temp (24hrs), Avg:98.2 F (36.8 C), Min:98 F (36.7 C), Max:98.3 F (36.8 C)   Recent Labs Lab 10/03/16 1317 10/03/16 2008 10/04/16 0638 10/05/16 0549  WBC 20.7*  --  16.8* 11.8*  CREATININE 7.50*  --  5.93* 3.88*  LATICACIDVEN 4.5* 1.2   --   --     Estimated Creatinine Clearance: 24.4 mL/min (by C-G formula based on SCr of 3.88 mg/dL (H)).    Not on File  Antimicrobials this admission: 2/2 vancomycin  >>  2/2 Zosyn >>   Dose adjustments this admission:  Microbiology results: 2/2 BCx: sent 2/2 UCx: sent 2/2 Wound cx foot= +GPC, +GNR, + GPR  Thank you for allowing pharmacy to be a part of this patient's care.  Angelique BlonderMerrill,Jalayiah Bibian A, PharmD, BCPS Clinical Pharmacist 10/05/2016 11:34 AM

## 2016-10-05 NOTE — Progress Notes (Signed)
[facility]  ROUNDING NOTE   Subjective:  Renal function has improved significantly. BUN down to 87 with a creatinine of 3.8. Good urine output at 3 L yesterday. Patient in good spirits. Wound VAC in place in right foot.   Objective:  Vital signs in last 24 hours:  Temp:  [98 F (36.7 C)-98.3 F (36.8 C)] 98.3 F (36.8 C) (02/04 0450) Pulse Rate:  [73-92] 73 (02/04 1139) Resp:  [18-28] 18 (02/04 1139) BP: (90-120)/(54-62) 90/58 (02/04 1139) SpO2:  [93 %-97 %] 93 % (02/04 1139) Weight:  [117.8 kg (259 lb 12.8 oz)-119.1 kg (262 lb 9.6 oz)] 117.8 kg (259 lb 12.8 oz) (02/04 0450)  Weight change: 1.179 kg (2 lb 9.6 oz) Filed Weights   10/04/16 0400 10/04/16 2106 10/05/16 0450  Weight: 117 kg (257 lb 15 oz) 119.1 kg (262 lb 9.6 oz) 117.8 kg (259 lb 12.8 oz)    Intake/Output: I/O last 3 completed shifts: In: 3513.3 [P.O.:680; I.V.:2683.3; IV Piggyback:150] Out: 4175 [Urine:3950; Drains:225]   Intake/Output this shift:  Total I/O In: 240 [P.O.:240] Out: 1000 [Urine:1000]  Physical Exam: General: No acute distress  Head: Normocephalic, atraumatic. Moist oral mucosal membranes  Eyes: Anicteric  Neck: Supple, trachea midline  Lungs:  Clear to auscultation, normal effort  Heart: S1S2 no rubs  Abdomen:  Soft, nontender, bowel sounds present  Extremities: 2+ RLE edema, wound vac in place right foot.  Neurologic: Nonfocal, moving all four extremities  Skin: No rashes       Basic Metabolic Panel:  Recent Labs Lab 10/03/16 1317 10/04/16 0638 10/05/16 0549  NA 135 136 140  K 5.3* 5.0 4.3  CL 103 110 110  CO2 11* 13* 19*  GLUCOSE 168* 242* 345*  BUN 120* 103* 87*  CREATININE 7.50* 5.93* 3.88*  CALCIUM 8.7* 7.7* 8.3*    Liver Function Tests:  Recent Labs Lab 10/03/16 1317  AST 72*  ALT 30  ALKPHOS 71  BILITOT 1.1  PROT 7.8  ALBUMIN 2.9*   No results for input(s): LIPASE, AMYLASE in the last 168 hours. No results for input(s): AMMONIA in the  last 168 hours.  CBC:  Recent Labs Lab 10/03/16 1317 10/04/16 0638 10/05/16 0549  WBC 20.7* 16.8* 11.8*  NEUTROABS 18.1*  --   --   HGB 12.1* 10.0* 10.1*  HCT 35.9* 30.2* 30.4*  MCV 89.2 88.5 87.2  PLT 336 306 351    Cardiac Enzymes:  Recent Labs Lab 10/03/16 1317  TROPONINI 0.07*    BNP: Invalid input(s): POCBNP  CBG:  Recent Labs Lab 10/03/16 1834 10/03/16 2049 10/05/16 0758 10/05/16 1140  GLUCAP 208* 203* 322* 305*    Microbiology: Results for orders placed or performed during the hospital encounter of 10/03/16  Blood Culture (routine x 2)     Status: None (Preliminary result)   Collection Time: 10/03/16  1:17 PM  Result Value Ref Range Status   Specimen Description BLOOD RIGHT FOREARM  Final   Special Requests BOTTLES DRAWN AEROBIC AND ANAEROBIC AER11ML ANA9ML  Final   Culture NO GROWTH 2 DAYS  Final   Report Status PENDING  Incomplete  Blood Culture (routine x 2)     Status: None (Preliminary result)   Collection Time: 10/03/16  1:18 PM  Result Value Ref Range Status   Specimen Description BLOOD LEFT FOREARM  Final   Special Requests BOTTLES DRAWN AEROBIC AND ANAEROBIC AER5ML ANA4ML  Final   Culture NO GROWTH 2 DAYS  Final   Report Status PENDING  Incomplete  Urine culture     Status: None   Collection Time: 10/03/16  1:42 PM  Result Value Ref Range Status   Specimen Description URINE, RANDOM  Final   Special Requests NONE  Final   Culture   Final    NO GROWTH Performed at [facility], [address] [place], [place] [address]    Report Status 10/04/2016 FINAL  Final  Aerobic/Anaerobic Culture (surgical/deep wound)     Status: None (Preliminary result)   Collection Time: 10/03/16  5:38 PM  Result Value Ref Range Status   Specimen Description FOOT right foot abscess  Final   Special Requests NONE  Final   Gram Stain   Final    FEW WBC PRESENT, PREDOMINANTLY PMN ABUNDANT GRAM POSITIVE COCCI IN CLUSTERS ABUNDANT GRAM NEGATIVE  RODS ABUNDANT GRAM POSITIVE COCCI IN PAIRS ABUNDANT GRAM POSITIVE RODS    Culture   Final    CULTURE REINCUBATED FOR BETTER GROWTH Performed at [facility], [address] [place], [place] [address]    Report Status PENDING  Incomplete  MRSA PCR Screening     Status: None   Collection Time: 10/03/16  8:52 PM  Result Value Ref Range Status   MRSA by PCR NEGATIVE NEGATIVE Final    Comment:        The GeneXpert MRSA Assay (FDA approved for NASAL specimens only), is one component of a comprehensive MRSA colonization surveillance program. It is not intended to diagnose MRSA infection nor to guide or monitor treatment for MRSA infections.     Coagulation Studies: No results for input(s): LABPROT, INR in the last 72 hours.  Urinalysis:  Recent Labs  10/03/16 1342  COLORURINE AMBER*  LABSPEC 1.026  PHURINE 5.0  GLUCOSEU 50*  HGBUR SMALL*  BILIRUBINUR NEGATIVE  KETONESUR NEGATIVE  PROTEINUR 100*  NITRITE NEGATIVE  LEUKOCYTESUR NEGATIVE      Imaging: [place] Renal  Result Date: 10/04/2016 CLINICAL DATA:  Acute renal failure. EXAM: RENAL / URINARY TRACT ULTRASOUND COMPLETE COMPARISON:  None. FINDINGS: Right Kidney: Length: 12.9 cm. 4.8 cm simple cyst is seen in upper pole. Echogenicity within normal limits. No mass or hydronephrosis visualized. Left Kidney: Length: 12.2 cm. Echogenicity within normal limits. No mass or hydronephrosis visualized. Bladder: Appears normal for degree of bladder distention. Bilateral ureteral jets are noted. IMPRESSION: 4.8 cm simple right renal cyst.  No other renal abnormality seen. Electronically Signed   By: Lupita RaiderJames  Green Jr, M.D.   On: 10/04/2016 14:22     Medications:   .  sodium bicarbonate  infusion 1000 mL 100 mL/hr at 10/05/16 1156   . aspirin EC  81 mg Oral Daily  . atorvastatin  20 mg Oral Daily  . docusate sodium  100 mg Oral BID  . heparin  5,000 Units Subcutaneous Q8H  . Influenza vac split quadrivalent PF  0.5 mL  Intramuscular Tomorrow-1000  . insulin aspart  0-5 Units Subcutaneous QHS  . insulin aspart  0-9 Units Subcutaneous TID WC  . insulin glargine  10 Units Subcutaneous QHS  . piperacillin-tazobactam (ZOSYN)  IV  3.375 g Intravenous Q8H  . pneumococcal 23 valent vaccine  0.5 mL Intramuscular Tomorrow-1000  . vancomycin  1,500 mg Intravenous Q48H   acetaminophen **OR** acetaminophen, bisacodyl, ondansetron **OR** ondansetron (ZOFRAN) IV, oxyCODONE, traZODone  Assessment/ Plan:  66 y.o. male with [name], hypertension, [name], chronic kidney disease stage  II, who was admitted to [facility] on 10/03/2016 for evaluation of right foot infection.   1.  Acute renal failure, likely related to sepsis/BC powder ingestion/ACEi use 2.  CKD stage II, baseline Cr 1.1. 3.  Metabolic acidosis. 4.  Hypertension.  Plan:  Renal function continues to improve.  Creatinine down to 3.8 with a BUN of 87.  Renal ultrasound negative for obstruction. Continue IV fluid hydration with sodium bicarbonate drip given mild metabolic acidosis. Counseled the patient extensively today to avoid any further BC powder use. Continue to monitor renal function daily.  He will need follow-up as an outpatient both for the acute renal failure as well as [name].   LOS: 2 [name] 2/4/20182:02 PM

## 2016-10-05 NOTE — Progress Notes (Signed)
Initial Nutrition Assessment  DOCUMENTATION CODES:   Not applicable  INTERVENTION:  1. Follow up for oral nutrition supplement needs and other possible interventions to improve PO intake. Monitor and encourage adequate PO. Patient did not want anything to eat today - states "I have some things on my mind to think about."  NUTRITION DIAGNOSIS:   Inadequate oral intake related to poor appetite, acute illness as evidenced by per patient/family report.  GOAL:   Patient will meet greater than or equal to 90% of their needs  MONITOR:   PO intake, Labs, I & O's, Weight trends  REASON FOR ASSESSMENT:   Malnutrition Screening Tool    ASSESSMENT:   [name]  is a 66 y.o. male with a known history ofHypertension, diabetes mellitus type 2 comes in because of generalized weakness, swelling on the bottom of the right foot. Patient has been having redness, swelling on the part of the right foot for about 4 days, went to primary doctor and was given clindamycin  Spoke with [name] at bedside. He did not want to talk today - said "I do not need anything," and "I have some things on my mind to think about." Had yogurt and iced tea for lunch, claims he did not eat anything else. Claims he was eating poorly PTA but did not provide further details. Documented PO intake 80 and 60% last two meals. Claims a normal weight of 240# -  Currently 259#  Nutrition-Focused physical exam completed. Findings are no fat depletion, no muscle depletion, and moderate edema.  Labs and medications reviewed: CBG 305, 322 Colace, Lantus, Novolog with Meals NaHCO3 w/ D5 @ 14200mL/hr --> 408 calories  Diet Order:  Diet heart healthy/carb modified Room service appropriate? Yes; Fluid consistency: Thin  Skin:  Wound (see comment) (Stg II to leg)  Last BM:  10/04/2016  Height:   Ht Readings from Last 1 Encounters:  10/03/16 5\' 11"  (1.803 m)    Weight:   Wt Readings from Last 1 Encounters:  10/05/16  259 lb 12.8 oz (117.8 kg)    Ideal Body Weight:  78.18 kg  BMI:  Body mass index is 36.23 kg/m.  Estimated Nutritional Needs:   Kcal:  [id number] calories  Protein:  118-141 gm  Fluid:  >/= 2L  EDUCATION NEEDS:   No education needs identified at this time  Jerry AnoWilliam M. Dashiell Franchino, MS, RD LDN Inpatient Clinical Dietitian Pager [phone]

## 2016-10-05 NOTE — Progress Notes (Signed)
Dr. Luberta MutterKonidena notified of high blood sugar without insulin orders. Instructed to give sensitive sliding scale and start checking CBG q6h.

## 2016-10-05 NOTE — Progress Notes (Signed)
Vanc trough resulted at 10. Spoke with Augusto GambleJody, PharmD. Ok to give dose scheduled for now.

## 2016-10-05 NOTE — Progress Notes (Signed)
Daily Progress Note   Subjective  - 2 Days Post-Op  F.u right foot I & D.    Objective Vitals:   10/04/16 2106 10/05/16 0450 10/05/16 0939 10/05/16 1139  BP: (!) 106/59 (!) 112/57 (!) 109/56 (!) 90/58  Pulse: 92 88 92 73  Resp: 18 18  18   Temp: 98.2 F (36.8 C) 98.3 F (36.8 C)    TempSrc: Oral Oral    SpO2: 95% 97%  93%  Weight: 119.1 kg (262 lb 9.6 oz) 117.8 kg (259 lb 12.8 oz)    Height:        Physical Exam: Plantar right midfoot skin is becoming necrotic.  Noted deep tissue necrosis to midfoot.  Mostly bloody serous drainage to midfoot.   Laboratory CBC    Component Value Date/Time   WBC 11.8 (H) 10/05/2016 0549   HGB 10.1 (L) 10/05/2016 0549   HCT 30.4 (L) 10/05/2016 0549   PLT 351 10/05/2016 0549    BMET    Component Value Date/Time   NA 140 10/05/2016 0549   NA 140 04/04/2013 1238   K 4.3 10/05/2016 0549   K 4.1 04/04/2013 1238   CL 110 10/05/2016 0549   CL 108 (H) 04/04/2013 1238   CO2 19 (L) 10/05/2016 0549   CO2 30 04/04/2013 1238   GLUCOSE 345 (H) 10/05/2016 0549   GLUCOSE 98 04/04/2013 1238   BUN 87 (H) 10/05/2016 0549   BUN 19 (H) 04/04/2013 1238   CREATININE 3.88 (H) 10/05/2016 0549   CREATININE 0.87 04/04/2013 1238   CALCIUM 8.3 (L) 10/05/2016 0549   CALCIUM 8.8 04/04/2013 1238   GFRNONAA 15 (L) 10/05/2016 0549   GFRNONAA >60 04/04/2013 1238   GFRAA 17 (L) 10/05/2016 0549   GFRAA >60 04/04/2013 1238    Assessment/Planning: 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   Wound vac changed.  Will await revascularization.  Pt at very high risk of further tissue loss including foot due to extent of necrosis.   Gwyneth RevelsFowler, Silvano Garofano A  10/05/2016, 12:28 PM

## 2016-10-05 NOTE — Progress Notes (Signed)
Pharmacy Antibiotic Note  [name] is a 66 y.o. male admitted on 10/03/2016 with sepsis secondary to  cellulitis. Patient has been taking clindamycin PO outpatient with no improvement.  Pharmacy has been consulted for Vancomycin and Zosyn dosing.  According to ED note: X-Ray is concerning for gas forming organism.   Plan: DW: 92kg   Scr:7.5  Patient has already received Zosyn 3.375 iV x 1 dose and Vancomycin 1gm IV x1 dose.  Patients current CrCl calculated at 12.516ml/min and Scr is 7.5. Could not find any previous labs for the patient to see what baseline Scr was, but ED nurse says patient does not have PMH of ESRD or HD prior to admission.    Based on current renal function will give another dose of  Vancomycin 1gm  (total: vanc 2gm) and start patient on regimen of Vancomycin 1500mg  IV every 48hrs. Will monitor renal function daily and adjust dose as needed. May consider dosing based on random levels if needed.   Will start patient on Zosyn 3.375 IV EI every 12 hours. Monitor renal function daily and increase to every 8 hours if  CrCL improves to  >6720ml/min.   2/3:  Scr improved to 5.93, Crcl 15.9 ml/min. ( however Crcl still < 20 ml/min ).  -Will continue EI Zosyn 3.375gm IV Q12h. -Due to unstable/changing renal fxn will dose per Vancomycin Level, until Crcl > 20 ml/min and Scr more stable. Vancomycin trough ordered for 2/4 at 1400 (~48 hrs after 1st Vancomycin dose).  Goal trough 15-1320mcg/ml.  Continue to Follow Scr/renal fxn.  2/4: Scr:7.5 > 5.93 > 3.88.  Crcl 24.4 ml/min. Will transition Zosyn to 3.375gm EI q8h for improved renal fxn.  -Vancomycin trough= 10 mcg/ml.  Will transition to Vancomycin 1500 mg IV q36h. Note pt is obese and at risk for accumulation and renal fxn is changing/unstable. Will check trough again prior to time of next dose on 2/6 and dose based on level. Follow Renal fxn Daily until stabilizes.      Height: 5\' 11"  (180.3 cm) Weight: 259 lb 12.8 oz (117.8  kg) IBW/kg (Calculated) : 75.3  Temp (24hrs), Avg:98.2 F (36.8 C), Min:98 F (36.7 C), Max:98.3 F (36.8 C)   Recent Labs Lab 10/03/16 1317 10/03/16 2008 10/04/16 0638 10/05/16 0549 10/05/16 1403  WBC 20.7*  --  16.8* 11.8*  --   CREATININE 7.50*  --  5.93* 3.88*  --   LATICACIDVEN 4.5* 1.2  --   --   --   VANCOTROUGH  --   --   --   --  10*    Estimated Creatinine Clearance: 24.4 mL/min (by C-G formula based on SCr of 3.88 mg/dL (H)).    Not on File  Antimicrobials this admission: 2/2 vancomycin  >>  2/2 Zosyn >>   Dose adjustments this admission: 2/4:  Vanc Trough= 10 -  Changed from 1500mg  q48h to q36h  Microbiology results: 2/2 BCx: sent 2/2 UCx: sent 2/2 Wound cx foot= +GPC, +GNR, + GPR  Thank you for allowing pharmacy to be a part of this patient's care.  Angelique BlonderMerrill,Rickie Gutierres A, PharmD, BCPS Clinical Pharmacist 10/05/2016 2:57 PM

## 2016-10-06 ENCOUNTER — Encounter: Payer: Self-pay | Admitting: Podiatry

## 2016-10-06 DIAGNOSIS — E11621 Type 2 diabetes mellitus with foot ulcer: Secondary | ICD-10-CM

## 2016-10-06 DIAGNOSIS — L97519 Non-pressure chronic ulcer of other part of right foot with unspecified severity: Secondary | ICD-10-CM

## 2016-10-06 LAB — GLUCOSE, CAPILLARY
Glucose-Capillary: 230 mg/dL — ABNORMAL HIGH (ref 65–99)
Glucose-Capillary: 253 mg/dL — ABNORMAL HIGH (ref 65–99)
Glucose-Capillary: 257 mg/dL — ABNORMAL HIGH (ref 65–99)
Glucose-Capillary: 262 mg/dL — ABNORMAL HIGH (ref 65–99)
Glucose-Capillary: 269 mg/dL — ABNORMAL HIGH (ref 65–99)
Glucose-Capillary: 283 mg/dL — ABNORMAL HIGH (ref 65–99)

## 2016-10-06 LAB — BASIC METABOLIC PANEL
ANION GAP: 6 (ref 5–15)
BUN: 49 mg/dL — ABNORMAL HIGH (ref 6–20)
CHLORIDE: 106 mmol/L (ref 101–111)
CO2: 27 mmol/L (ref 22–32)
Calcium: 7.9 mg/dL — ABNORMAL LOW (ref 8.9–10.3)
Creatinine, Ser: 2.1 mg/dL — ABNORMAL HIGH (ref 0.61–1.24)
GFR calc non Af Amer: 31 mL/min — ABNORMAL LOW (ref >60)
GFR, EST AFRICAN AMERICAN: 36 mL/min — AB (ref >60)
Glucose, Bld: 264 mg/dL — ABNORMAL HIGH (ref 65–99)
POTASSIUM: 3.7 mmol/L (ref 3.5–5.1)
SODIUM: 139 mmol/L (ref 135–145)

## 2016-10-06 LAB — MAGNESIUM: MAGNESIUM: 1.6 mg/dL — AB (ref 1.7–2.4)

## 2016-10-06 MED ORDER — INSULIN GLARGINE 100 UNIT/ML ~~LOC~~ SOLN
15.0000 [IU] | Freq: Every day | SUBCUTANEOUS | Status: DC
  Administered 2016-10-06 – 2016-10-12 (×7): 15 [IU] via SUBCUTANEOUS
  Filled 2016-10-06 (×9): qty 0.15

## 2016-10-06 MED ORDER — MAGNESIUM SULFATE IN D5W 1-5 GM/100ML-% IV SOLN
1.0000 g | Freq: Once | INTRAVENOUS | Status: AC
  Administered 2016-10-06: 1 g via INTRAVENOUS
  Filled 2016-10-06: qty 100

## 2016-10-06 MED ORDER — INSULIN ASPART 100 UNIT/ML ~~LOC~~ SOLN
3.0000 [IU] | Freq: Three times a day (TID) | SUBCUTANEOUS | Status: DC
  Administered 2016-10-06 – 2016-10-08 (×6): 3 [IU] via SUBCUTANEOUS
  Filled 2016-10-06 (×6): qty 3

## 2016-10-06 NOTE — Progress Notes (Signed)
Spoke to Dr. Orland Jarredroxler, since MD changed wound vac yesterday, dressing changes can be done Tues/Thurs this week. Clydie BraunKaren, WOC RN notified.

## 2016-10-06 NOTE — Progress Notes (Signed)
[facility]  ROUNDING NOTE   Subjective:   UOP 2625 Creatinine 2.1 (3.88) (5.93)  Na bicarb @ 13mL/hr  Objective:  Vital signs in last 24 hours:  Temp:  [98 F (36.7 C)-98.7 F (37.1 C)] 98.5 F (36.9 C) (02/05 1218) Pulse Rate:  [36-95] 36 (02/05 1218) Resp:  [19] 19 (02/05 1218) BP: (99-117)/(47-66) 107/47 (02/05 1218) SpO2:  [92 %-99 %] 92 % (02/05 1218)  Weight change:  Filed Weights   10/04/16 0400 10/04/16 2106 10/05/16 0450  Weight: 117 kg (257 lb 15 oz) 119.1 kg (262 lb 9.6 oz) 117.8 kg (259 lb 12.8 oz)    Intake/Output: I/O last 3 completed shifts: In: 3393.3 [P.O.:240; I.V.:2503.3; IV Piggyback:650] Out: 5075 [Urine:4975; Drains:100]   Intake/Output this shift:  Total I/O In: 120 [P.O.:120] Out: 920 [Urine:920]  Physical Exam: General: No acute distress  Head: Normocephalic, atraumatic. Moist oral mucosal membranes  Eyes: Anicteric  Neck: Supple, trachea midline  Lungs:  Clear to auscultation, normal effort  Heart: S1S2 no rubs  Abdomen:  Soft, nontender, bowel sounds present  Extremities: 2+ RLE edema, wound vac in place right foot.  Neurologic: Nonfocal, moving all four extremities  Skin: No rashes       Basic Metabolic Panel:  Recent Labs Lab 10/03/16 1317 10/04/16 0638 10/05/16 0549 10/06/16 0528  NA 135 136 140 139  K 5.3* 5.0 4.3 3.7  CL 103 110 110 106  CO2 11* 13* 19* 27  GLUCOSE 168* 242* 345* 264*  BUN 120* 103* 87* 49*  CREATININE 7.50* 5.93* 3.88* 2.10*  CALCIUM 8.7* 7.7* 8.3* 7.9*  MG  --   --   --  1.6*    Liver Function Tests:  Recent Labs Lab 10/03/16 1317  AST 72*  ALT 30  ALKPHOS 71  BILITOT 1.1  PROT 7.8  ALBUMIN 2.9*   No results for input(s): LIPASE, AMYLASE in the last 168 hours. No results for input(s): AMMONIA in the last 168 hours.  CBC:  Recent Labs Lab 10/03/16 1317 10/04/16 0638 10/05/16 0549  WBC 20.7* 16.8* 11.8*  NEUTROABS 18.1*  --   --   HGB 12.1* 10.0* 10.1*  HCT  35.9* 30.2* 30.4*  MCV 89.2 88.5 87.2  PLT 336 306 351    Cardiac Enzymes:  Recent Labs Lab 10/03/16 1317  TROPONINI 0.07*    BNP: Invalid input(s): POCBNP  CBG:  Recent Labs Lab 10/05/16 2035 10/06/16 0013 10/06/16 0430 10/06/16 0746 10/06/16 1218  GLUCAP 277* 253* 257* 262* 230*    Microbiology: Results for orders placed or performed during the hospital encounter of 10/03/16  Blood Culture (routine x 2)     Status: None (Preliminary result)   Collection Time: 10/03/16  1:17 PM  Result Value Ref Range Status   Specimen Description BLOOD RIGHT FOREARM  Final   Special Requests BOTTLES DRAWN AEROBIC AND ANAEROBIC AER11ML ANA9ML  Final   Culture NO GROWTH 3 DAYS  Final   Report Status PENDING  Incomplete  Blood Culture (routine x 2)     Status: None (Preliminary result)   Collection Time: 10/03/16  1:18 PM  Result Value Ref Range Status   Specimen Description BLOOD LEFT FOREARM  Final   Special Requests BOTTLES DRAWN AEROBIC AND ANAEROBIC AER5ML ANA4ML  Final   Culture NO GROWTH 3 DAYS  Final   Report Status PENDING  Incomplete  Urine culture     Status: None   Collection Time: 10/03/16  1:42 PM  Result Value  Ref Range Status   Specimen Description URINE, RANDOM  Final   Special Requests NONE  Final   Culture   Final    NO GROWTH Performed at [facility], [address] [place], [place] [address]    Report Status 10/04/2016 FINAL  Final  Aerobic/Anaerobic Culture (surgical/deep wound)     Status: None (Preliminary result)   Collection Time: 10/03/16  5:38 PM  Result Value Ref Range Status   Specimen Description FOOT right foot abscess  Final   Special Requests NONE  Final   Gram Stain   Final    FEW WBC PRESENT, PREDOMINANTLY PMN ABUNDANT GRAM POSITIVE COCCI IN CLUSTERS ABUNDANT GRAM NEGATIVE RODS ABUNDANT GRAM POSITIVE COCCI IN PAIRS ABUNDANT GRAM POSITIVE RODS    Culture   Final    CULTURE REINCUBATED FOR BETTER GROWTH HOLDING FOR POSSIBLE  ANAEROBE Performed at [facility], [address], [place], [place] [address]    Report Status PENDING  Incomplete  MRSA PCR Screening     Status: None   Collection Time: 10/03/16  8:52 PM  Result Value Ref Range Status   MRSA by PCR NEGATIVE NEGATIVE Final    Comment:        The GeneXpert MRSA Assay (FDA approved for NASAL specimens only), is one component of a comprehensive MRSA colonization surveillance program. It is not intended to diagnose MRSA infection nor to guide or monitor treatment for MRSA infections.     Coagulation Studies: No results for input(s): LABPROT, INR in the last 72 hours.  Urinalysis:  Recent Labs  10/03/16 1342  COLORURINE AMBER*  LABSPEC 1.026  PHURINE 5.0  GLUCOSEU 50*  HGBUR SMALL*  BILIRUBINUR NEGATIVE  KETONESUR NEGATIVE  PROTEINUR 100*  NITRITE NEGATIVE  LEUKOCYTESUR NEGATIVE      Imaging: No results found.   Medications:   .  sodium bicarbonate  infusion 1000 mL 100 mL/hr at 10/06/16 0950   . aspirin EC  81 mg Oral Daily  . atorvastatin  20 mg Oral Daily  . docusate sodium  100 mg Oral BID  . heparin  5,000 Units Subcutaneous Q8H  . Influenza vac split quadrivalent PF  0.5 mL Intramuscular Tomorrow-1000  . insulin aspart  0-5 Units Subcutaneous QHS  . insulin aspart  0-9 Units Subcutaneous TID WC  . insulin glargine  10 Units Subcutaneous QHS  . magnesium sulfate 1 - 4 g bolus IVPB  1 g Intravenous Once  . piperacillin-tazobactam (ZOSYN)  IV  3.375 g Intravenous Q8H  . pneumococcal 23 valent vaccine  0.5 mL Intramuscular Tomorrow-1000  . vancomycin  1,500 mg Intravenous Q36H   acetaminophen **OR** acetaminophen, bisacodyl, ondansetron **OR** ondansetron (ZOFRAN) IV, oxyCODONE, traZODone  Assessment/ Plan:  66 y.o. male with [name], hypertension, [name], [name], who was admitted to [facility] on 10/03/2016 for evaluation  of right foot infection.   1.  Acute renal failure with metabolic acidosis: on chronic kidney disease stage III. Unknown baseline. Reported to be creatinine 1.1 in 2016 when seen by [facility].  Acute renal failure secondary to sepsis, hypotension and acute BC powder use.  Chronic kidney disease secondary to hypertension, diabetes, vascular disease and NSAID induced nephropathy Urinalysis with proteinuria, glucosuria on admission - Hold ACE-I. lisinopril-hydrochrlorothiazide.  - Continue IV sodium bicarbonate at 160mL/hr - If creatinine improves, may proceed with vascular procedure.   2. Hypertension:  blood pressure at goal. Not currently on any agents.   3. Diabetic foot infection: with peripheral vascular disease. Dr. 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  - pip/tazo and vanco - Appreciate vascular input.  - Continue glucose control   LOS: 3 Marabeth Melland 2/5/201812:45 PM

## 2016-10-06 NOTE — Progress Notes (Signed)
Wound nurse, Clydie BraunKaren, aware that patient has a wound vac which was changed by MD yesterday. She will look at orders.

## 2016-10-06 NOTE — Progress Notes (Signed)
Wound vac intact. Vasc awaiting improved renal fxn prior to angio. Will follow as suspect will need further debridement.

## 2016-10-06 NOTE — Progress Notes (Signed)
Inpatient Diabetes Program Recommendations  AACE/ADA: New Consensus Statement on Inpatient Glycemic Control (2015)  Target Ranges:  Prepandial:   less than 140 mg/dL      Peak postprandial:   less than 180 mg/dL (1-2 hours)      Critically ill patients:  140 - 180 mg/dL   Results for [name], [place] O (MRN [id number]) as of 10/06/2016 09:35  Ref. Range 10/05/2016 07:58 10/05/2016 11:40 10/05/2016 17:15 10/05/2016 20:35 10/06/2016 00:13 10/06/2016 04:30 10/06/2016 07:46  Glucose-Capillary Latest Ref Range: 65 - 99 mg/dL 409322 (H) 811305 (H) 914263 (H) 277 (H) 253 (H) 257 (H) 262 (H)   Review of Glycemic Control  Diabetes history: DM2 Outpatient Diabetes medications: Metformin 1000 mg BID Current orders for Inpatient glycemic control: Lantus 10 units QHS, Novolog 0-9 units TID with meals, Novolsog 0-5 units QHS  Inpatient Diabetes Program Recommendations: Insulin - Basal: Please consider increasing Lantus to 15 units QHS. Insulin - Meal Coverage: Please consider ordering Novolog 3 units TID with meals for meal coverage if patient is eating at least 50% of meals. HgbA1C: Please add an A1C on to blood in lab or draw with next scheduled blood draw to evaluate glycemic control over the past 2-3 months.  Thanks, Orlando PennerMarie Leonidas Boateng, RN, MSN, CDE Diabetes Coordinator Inpatient Diabetes Program [phone] (Team Pager from 8am to 5pm)

## 2016-10-06 NOTE — Consult Note (Signed)
Consult Note  Patient name: [name] MRN: [id number] DOB: [birth year] Sex: male  Consulting Physician:  Dr. Ether Griffins  Reason for Consult:  Chief Complaint  Patient presents with  . Weakness    HISTORY OF PRESENT ILLNESS: 66 year old male admitted with weakness.  He was found to have a right foot infection for which he went to the OR with Dr. Ether Griffins yesterday for I&D  And partial right 5th ray amputation.  OR findings noted poor capillary bleeding and vascular was consulted.  Today the patient denies pain. He has been staying in bed he is not worked with physical therapy  PAST MEDICAL HISTORY:       Past Medical History:  Diagnosis Date  . Diabetes mellitus without complication (HCC)   . Hypertension     PAST SURGICAL HISTOIRY:  History reviewed. No pertinent surgical history.  SOCIAL HISTORY:       Social History  Substance Use Topics  . Smoking status: Former Games developer  . Smokeless tobacco: Never Used  . Alcohol use No    FAMILY HISTORY:        Family History  Problem Relation Age of Onset  . Family history unknown: Yes   Family history significant for mother has diabetes. DRUG ALLERGIES:  Not on File NKA  REVIEW OF SYSTEMS:  CONSTITUTIONAL: Hypothermia, generalized weakness.Marland Kitchen  EYES: No blurred or double vision.  EARS, NOSE, AND THROAT: No tinnitus or ear pain.  RESPIRATORY: No cough, shortness of breath, wheezing or hemoptysis.  CARDIOVASCULAR: No chest pain, orthopnea, edema.  GASTROINTESTINAL: No nausea, vomiting, diarrhea or abdominal pain.  GENITOURINARY: No dysuria, hematuria.  ENDOCRINE: No polyuria, nocturia,  HEMATOLOGY: No anemia, easy bruising or bleeding SKIN: No rash or lesion. MUSCULOSKELETAL: Swelling, redness of the right foot, blister noted on the dorsum of the right foot. Area of swelling on the plantar aspect of the right foot.   NEUROLOGIC: No tingling, numbness, weakness.  PSYCHIATRY: No anxiety or  depression.   MEDICATIONS AT HOME:          Prior to Admission medications   Medication Sig Start Date End Date Taking? Authorizing Provider  aspirin EC 81 MG tablet Take 81 mg by mouth daily.   Yes Historical Provider, MD  atorvastatin (LIPITOR) 20 MG tablet Take 20 mg by mouth daily.   Yes Historical Provider, MD  clindamycin (CLEOCIN) 300 MG capsule Take 300 mg by mouth 4 (four) times daily.   Yes Historical Provider, MD  lisinopril-hydrochlorothiazide (PRINZIDE,ZESTORETIC) 20-25 MG tablet Take 1 tablet by mouth daily.   Yes Historical Provider, MD  metFORMIN (GLUCOPHAGE) 1000 MG tablet Take 1,000 mg by mouth 2 (two) times daily with a meal.   Yes Historical Provider, MD      PHYSICAL EXAMINATION: General: The patient appears their stated age.  Vital signs are BP 117/66 (BP Location: Left Arm)   Pulse 90   Temp 98 F (36.7 C) (Oral)   Resp 18   Ht 5\' 11"  (1.803 m)   Wt 117.8 kg (259 lb 12.8 oz)   SpO2 96%   BMI 36.23 kg/m  Pulmonary: Respirations are non-labored HEENT:  No gross abnormalities Abdomen: Soft and non-tender  Musculoskeletal: There are no major deformities.   Neurologic: No focal weakness or paresthesias are detected, Skin: There are no ulcer or rashes noted. Psychiatric: The patient has normal affect. Cardiovascular: There is a regular rate and rhythm.  He has a palpable right femoral pulse  but no palpable pedal pulses.  1+ edema to the right foot   Assessment:  Vascular insufficiency in the setting of a diabetic right foot infection   Plan: Clearly the patient will need angiography as he has limb threatening ischemia with ulceration of his right foot. Hopefully intervention will be performed in his leg can be salvaged. Currently, he is still recovering from his acute renal failure and therefore given the stable ulceration and his debridement I will continue to hold on angiography until his renal function has recovered. I will discuss with  nephrology timing of angiography and intervention area

## 2016-10-06 NOTE — Progress Notes (Signed)
Per CCMD, patient had 10 beats of VT. Asymptomatic, no complaints. Dr. Luberta MutterKonidena aware - order to add on Magnesium lab.

## 2016-10-06 NOTE — Progress Notes (Signed)
[facility] - [place] [facility]   PATIENT NAME: [name]    MR#:  [id number]  DATE OF BIRTH:  Jan 15, 1951  SUBJECTIVE:admitted  for septic shock secondary to gas gangreneof the right fifth toe. Status post is  Incision  and drainage, fifth today amputation by podiatry . Patient also had wound VAC.   Marland Kitchen patient denies any complaints. No nausea or vomiting. But has constipation.   CHIEF COMPLAINT:   Chief Complaint  Patient presents with  . Weakness    REVIEW OF SYSTEMS:    Review of Systems  Constitutional: Negative for chills and fever.  HENT: Negative for hearing loss.   Eyes: Negative for blurred vision, double vision and photophobia.  Respiratory: Negative for cough, hemoptysis and shortness of breath.   Cardiovascular: Negative for palpitations, orthopnea and leg swelling.  Gastrointestinal: Negative for abdominal pain, diarrhea and vomiting.  Genitourinary: Negative for dysuria and urgency.  Musculoskeletal: Negative for myalgias and neck pain.  Skin: Negative for rash.  Neurological: Negative for dizziness, focal weakness, seizures, weakness and headaches.  Psychiatric/Behavioral: Negative for memory loss. The patient does not have insomnia.    Wound vac present to right foot. Nutrition:  Tolerating Diet: Tolerating PT:      DRUG ALLERGIES:  Not on File  VITALS:  Blood pressure (!) 107/47, pulse (!) 36, temperature 98.5 F (36.9 C), temperature source Oral, resp. rate 19, height 5\' 11"  (1.803 m), weight 117.8 kg (259 lb 12.8 oz), SpO2 92 %.  PHYSICAL EXAMINATION:   Physical Exam  GENERAL:  66 y.o.-year-old patient lying in the bed with no acute distress.  EYES: Pupils equal, round, reactive to light and accommodation. No scleral icterus. Extraocular muscles intact.  HEENT: Head atraumatic, normocephalic. Oropharynx and nasopharynx clear.  NECK:  Supple, no jugular venous distention. No thyroid enlargement, no tenderness.   LUNGS: Normal breath sounds bilaterally, no wheezing, rales,rhonchi or crepitation. No use of accessory muscles of respiration.  CARDIOVASCULAR: S1, S2 normal. No murmurs, rubs, or gallops.  ABDOMEN: Soft, nontender, nondistended. Bowel sounds present. No organomegaly or mass.  EXTREMITIES: No pedal edema, cyanosis, or clubbing.  NEUROLOGIC: Cranial nerves II through XII are intact. Muscle strength 5/5 in all extremities. Sensation intact. Gait not checked.  PSYCHIATRIC: The patient is alert and oriented x 3.  SKIN:s/p wound vac.to right foot.Marland Kitchen    LABORATORY PANEL:   CBC  Recent Labs Lab 10/05/16 0549  WBC 11.8*  HGB 10.1*  HCT 30.4*  PLT 351   ------------------------------------------------------------------------------------------------------------------  Chemistries   Recent Labs Lab 10/03/16 1317  10/06/16 0528  NA 135  < > 139  K 5.3*  < > 3.7  CL 103  < > 106  CO2 11*  < > 27  GLUCOSE 168*  < > 264*  BUN 120*  < > 49*  CREATININE 7.50*  < > 2.10*  CALCIUM 8.7*  < > 7.9*  MG  --   --  1.6*  AST 72*  --   --   ALT 30  --   --   ALKPHOS 71  --   --   BILITOT 1.1  --   --   < > = values in this interval not displayed. ------------------------------------------------------------------------------------------------------------------  Cardiac Enzymes  Recent Labs Lab 10/03/16 1317  TROPONINI 0.07*   ------------------------------------------------------------------------------------------------------------------  RADIOLOGY:  No results found.   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 WBC coming down. continue IV hydration with bicarbonate.   hypotension improved, blood cultures negative to  date.wound  Culture showed multiple organisms with gram-positive cocci, gram-negative rods, gram-positive rods, awaiting for final results.  Continue  Wound vac,follow wound culture results,   #2,acute renal failure secondary to septic shock, BC powder ingestion, ACE inhibitor's use: METABOLIC ACIDOSIS: Improving function.bicarb  improved from 13-19. Continue bicarbonate drip in preparation for possible angiogram of the  Right leg and also use of contrast.   3.poor circulation in right foot;vascular consulted;  GI and DVT prophylaxis;  4.DMIIL:high BG;'adjust levemir dose,add meal time coverage, All the records are reviewed and case discussed with Care Management/Social Workerr. Management plans discussed with the patient, family and they are in agreement.  CODE STATUS: full  TOTAL TIME TAKING CARE OF THIS PATIENT:35 minutes.   POSSIBLE D/C IN 3-4 DAYS, DEPENDING ON CLINICAL CONDITION.   Katha HammingKONIDENA,Cathryne Mancebo M.D on 10/06/2016 at 12:58 PM  Between 7am to 6pm - Pager - [phone]  After 6pm go to [email or web address] - password [email or web address] [facility]  [facility]  Office  [phone]  CC: Primary care physician; No PCP Per Patient

## 2016-10-07 ENCOUNTER — Inpatient Hospital Stay
Admit: 2016-10-07 | Discharge: 2016-10-07 | Disposition: A | Discharge status: Home / Self Care | Payer: Medicare HMO | Attending: Internal Medicine | Admitting: Internal Medicine

## 2016-10-07 DIAGNOSIS — E11621 Type 2 diabetes mellitus with foot ulcer: Secondary | ICD-10-CM

## 2016-10-07 DIAGNOSIS — L97519 Non-pressure chronic ulcer of other part of right foot with unspecified severity: Secondary | ICD-10-CM

## 2016-10-07 LAB — RENAL FUNCTION PANEL
Albumin: 2.2 g/dL — ABNORMAL LOW (ref 3.5–5.0)
Anion gap: 7 (ref 5–15)
BUN: 30 mg/dL — ABNORMAL HIGH (ref 6–20)
CO2: 31 mmol/L (ref 22–32)
Calcium: 8.1 mg/dL — ABNORMAL LOW (ref 8.9–10.3)
Chloride: 102 mmol/L (ref 101–111)
Creatinine, Ser: 1.62 mg/dL — ABNORMAL HIGH (ref 0.61–1.24)
GFR calc Af Amer: 49 mL/min — ABNORMAL LOW
GFR calc non Af Amer: 43 mL/min — ABNORMAL LOW
Glucose, Bld: 239 mg/dL — ABNORMAL HIGH (ref 65–99)
Phosphorus: 2 mg/dL — ABNORMAL LOW (ref 2.5–4.6)
Potassium: 3.6 mmol/L (ref 3.5–5.1)
Sodium: 140 mmol/L (ref 135–145)

## 2016-10-07 LAB — SURGICAL PATHOLOGY

## 2016-10-07 LAB — ECHOCARDIOGRAM COMPLETE
Height: 71 in
Weight: 4321.6 oz

## 2016-10-07 LAB — AEROBIC/ANAEROBIC CULTURE W GRAM STAIN (SURGICAL/DEEP WOUND): Culture: NORMAL

## 2016-10-07 LAB — GLUCOSE, CAPILLARY
Glucose-Capillary: 231 mg/dL — ABNORMAL HIGH (ref 65–99)
Glucose-Capillary: 251 mg/dL — ABNORMAL HIGH (ref 65–99)
Glucose-Capillary: 225 mg/dL — ABNORMAL HIGH (ref 65–99)
Glucose-Capillary: 241 mg/dL — ABNORMAL HIGH (ref 65–99)

## 2016-10-07 LAB — VANCOMYCIN, TROUGH: Vancomycin Tr: 9 ug/mL — ABNORMAL LOW (ref 15–20)

## 2016-10-07 MED ORDER — LIVING WELL WITH DIABETES BOOK
Freq: Once | Status: DC
  Filled 2016-10-07: qty 1

## 2016-10-07 MED ORDER — SODIUM CHLORIDE 0.9 % IV SOLN
INTRAVENOUS | Status: DC
  Administered 2016-10-07: 1000 mL via INTRAVENOUS
  Administered 2016-10-11 (×2): via INTRAVENOUS

## 2016-10-07 MED ORDER — VANCOMYCIN HCL 10 G IV SOLR
1500.0000 mg | INTRAVENOUS | Status: DC
  Administered 2016-10-08: 1500 mg via INTRAVENOUS
  Filled 2016-10-07 (×2): qty 1500

## 2016-10-07 MED ORDER — PERFLUTREN LIPID MICROSPHERE
1.0000 mL | INTRAVENOUS | Status: AC | PRN
  Filled 2016-10-07: qty 10

## 2016-10-07 NOTE — Progress Notes (Signed)
*  PRELIMINARY RESULTS* Echocardiogram 2D Echocardiogram has been performed.  Cristela BlueHege, Bobbiejo Ishikawa 10/07/2016, 9:44 AM

## 2016-10-07 NOTE — Progress Notes (Signed)
Patient has no acute event overnight. NaHCO3 infusing at 11100ml/hr. Woundvac at -75 in place. NSR with VS WDL for patient. Patient assisted as needed .

## 2016-10-07 NOTE — Progress Notes (Signed)
*  PRELIMINARY RESULTS* Echocardiogram 2D Echocardiogram has been performed.  Cristela BlueHege, Matias Thurman 10/07/2016, 9:43 AM

## 2016-10-07 NOTE — Progress Notes (Signed)
Pharmacy Antibiotic Note  Danford BadWilbert O Tesch is a 66 y.o. male admitted on 10/03/2016 with sepsis secondary to  cellulitis. Patient has been taking clindamycin PO outpatient with no improvement.  Pharmacy has been consulted for Vancomycin and Zosyn dosing.  According to ED note: X-Ray is concerning for gas forming organism.   Plan: DW: 92kg   Scr:7.5  Patient has already received Zosyn 3.375 iV x 1 dose and Vancomycin 1gm IV x1 dose.  Patients current CrCl calculated at 12.576ml/min and Scr is 7.5. Could not find any previous labs for the patient to see what baseline Scr was, but ED nurse says patient does not have PMH of ESRD or HD prior to admission.    Based on current renal function will give another dose of  Vancomycin 1gm  (total: vanc 2gm) and start patient on regimen of Vancomycin 1500mg  IV every 48hrs. Will monitor renal function daily and adjust dose as needed. May consider dosing based on random levels if needed.   Will start patient on Zosyn 3.375 IV EI every 12 hours. Monitor renal function daily and increase to every 8 hours if  CrCL improves to  >6720ml/min.   2/3:  Scr improved to 5.93, Crcl 15.9 ml/min. ( however Crcl still < 20 ml/min ).  -Will continue EI Zosyn 3.375gm IV Q12h. -Due to unstable/changing renal fxn will dose per Vancomycin Level, until Crcl > 20 ml/min and Scr more stable. Vancomycin trough ordered for 2/4 at 1400 (~48 hrs after 1st Vancomycin dose).  Goal trough 15-4420mcg/ml.  Continue to Follow Scr/renal fxn.  2/4: Scr:7.5 > 5.93 > 3.88.  Crcl 24.4 ml/min. Will transition Zosyn to 3.375gm EI q8h for improved renal fxn.  -Vancomycin trough= 10 mcg/ml.  Will transition to Vancomycin 1500 mg IV q36h. Note pt is obese and at risk for accumulation and renal fxn is changing/unstable. Will check trough again prior to time of next dose on 2/6 and dose based on level. Follow Renal fxn Daily until stabilizes.  2/6 0300 vanc level 9. Changing to 1500 mg q 24 hours . Level  ordered before 3rd dose of new regimen. Renal function continues to improve.       Height: 5\' 11"  (180.3 cm) Weight: 259 lb 12.8 oz (117.8 kg) IBW/kg (Calculated) : 75.3  Temp (24hrs), Avg:99.7 F (37.6 C), Min:98.5 F (36.9 C), Max:100.8 F (38.2 C)   Recent Labs Lab 10/03/16 1317 10/03/16 2008 10/04/16 [id number] 10/05/16 0549 10/05/16 1403 10/06/16 0528 10/07/16 0314  WBC 20.7*  --  16.8* 11.8*  --   --   --   CREATININE 7.50*  --  5.93* 3.88*  --  2.10* 1.62*  LATICACIDVEN 4.5* 1.2  --   --   --   --   --   VANCOTROUGH  --   --   --   --  10*  --  9*    Estimated Creatinine Clearance: 58.6 mL/min (by C-G formula based on SCr of 1.62 mg/dL (H)).    Not on File  Antimicrobials this admission: 2/2 vancomycin  >>  2/2 Zosyn >>   Dose adjustments this admission: 2/4:  Vanc Trough= 10 -  Changed from 1500mg  q48h to q36h  Microbiology results: 2/2 BCx: sent 2/2 UCx: sent 2/2 Wound cx foot= +GPC, +GNR, + GPR  Thank you for allowing pharmacy to be a part of this patient's care.  Erich MontaneMcBane,Cyler Kappes S, PharmD, BCPS Clinical Pharmacist 10/07/2016 4:52 AM

## 2016-10-07 NOTE — Progress Notes (Signed)
[facility]  ROUNDING NOTE   Subjective:   UOP 1920 Creatinine 1.62 (2.1)  Objective:  Vital signs in last 24 hours:  Temp:  [98.2 F (36.8 C)-100.8 F (38.2 C)] 98.2 F (36.8 C) (02/06 1140) Pulse Rate:  [78-88] 81 (02/06 1140) Resp:  [16-18] 18 (02/06 1140) BP: (105-131)/(48-62) 105/62 (02/06 1140) SpO2:  [91 %-98 %] 98 % (02/06 1140) Weight:  [122.5 kg (270 lb 1.6 oz)] 122.5 kg (270 lb 1.6 oz) (02/06 0509)  Weight change:  Filed Weights   10/04/16 2106 10/05/16 0450 10/07/16 0509  Weight: 119.1 kg (262 lb 9.6 oz) 117.8 kg (259 lb 12.8 oz) 122.5 kg (270 lb 1.6 oz)    Intake/Output: I/O last 3 completed shifts: In: 4560 [P.O.:360; I.V.:3600; IV Piggyback:600] Out: 2915 [Urine:2845; Drains:70]   Intake/Output this shift:  Total I/O In: 840 [P.O.:840] Out: 1320 [Urine:1170; Drains:150]  Physical Exam: General: No acute distress  Head: Normocephalic, atraumatic. Moist oral mucosal membranes  Eyes: Anicteric  Neck: Supple, trachea midline  Lungs:  Clear to auscultation, normal effort  Heart: S1S2 no rubs  Abdomen:  Soft, nontender, bowel sounds present  Extremities: 1+ RLE edema, wound vac in place right foot.  Neurologic: Nonfocal, moving all four extremities  Skin: No rashes       Basic Metabolic Panel:  Recent Labs Lab 10/03/16 1317 10/04/16 0638 10/05/16 0549 10/06/16 0528 10/07/16 0314  NA 135 136 140 139 140  K 5.3* 5.0 4.3 3.7 3.6  CL 103 110 110 106 102  CO2 11* 13* 19* 27 31  GLUCOSE 168* 242* 345* 264* 239*  BUN 120* 103* 87* 49* 30*  CREATININE 7.50* 5.93* 3.88* 2.10* 1.62*  CALCIUM 8.7* 7.7* 8.3* 7.9* 8.1*  MG  --   --   --  1.6*  --   PHOS  --   --   --   --  2.0*    Liver Function Tests:  Recent Labs Lab 10/03/16 1317 10/07/16 0314  AST 72*  --   ALT 30  --   ALKPHOS 71  --   BILITOT 1.1  --   PROT 7.8  --   ALBUMIN 2.9* 2.2*   No results for input(s): LIPASE, AMYLASE in the last 168 hours. No results for  input(s): AMMONIA in the last 168 hours.  CBC:  Recent Labs Lab 10/03/16 1317 10/04/16 0638 10/05/16 0549  WBC 20.7* 16.8* 11.8*  NEUTROABS 18.1*  --   --   HGB 12.1* 10.0* 10.1*  HCT 35.9* 30.2* 30.4*  MCV 89.2 88.5 87.2  PLT 336 306 351    Cardiac Enzymes:  Recent Labs Lab 10/03/16 1317  TROPONINI 0.07*    BNP: Invalid input(s): POCBNP  CBG:  Recent Labs Lab 10/06/16 1218 10/06/16 1702 10/06/16 2253 10/07/16 0730 10/07/16 1157  GLUCAP 230* 269* 283* 231* 251*    Microbiology: Results for orders placed or performed during the hospital encounter of 10/03/16  Blood Culture (routine x 2)     Status: None (Preliminary result)   Collection Time: 10/03/16  1:17 PM  Result Value Ref Range Status   Specimen Description BLOOD RIGHT FOREARM  Final   Special Requests BOTTLES DRAWN AEROBIC AND ANAEROBIC AER11ML ANA9ML  Final   Culture NO GROWTH 4 DAYS  Final   Report Status PENDING  Incomplete  Blood Culture (routine x 2)     Status: None (Preliminary result)   Collection Time: 10/03/16  1:18 PM  Result Value Ref Range Status  Specimen Description BLOOD LEFT FOREARM  Final   Special Requests BOTTLES DRAWN AEROBIC AND ANAEROBIC AER5ML ANA4ML  Final   Culture NO GROWTH 4 DAYS  Final   Report Status PENDING  Incomplete  Urine culture     Status: None   Collection Time: 10/03/16  1:42 PM  Result Value Ref Range Status   Specimen Description URINE, RANDOM  Final   Special Requests NONE  Final   Culture   Final    NO GROWTH Performed at [facility], [address], [place], [place] [address]    Report Status 10/04/2016 FINAL  Final  Aerobic/Anaerobic Culture (surgical/deep wound)     Status: None   Collection Time: 10/03/16  5:38 PM  Result Value Ref Range Status   Specimen Description FOOT right foot abscess  Final   Special Requests NONE  Final   Gram Stain   Final    FEW WBC PRESENT, PREDOMINANTLY PMN ABUNDANT GRAM POSITIVE COCCI IN  CLUSTERS ABUNDANT GRAM NEGATIVE RODS ABUNDANT GRAM POSITIVE COCCI IN PAIRS ABUNDANT GRAM POSITIVE RODS Performed at [facility], [address], [place], [place] [address]    Culture   Final    NORMAL SKIN FLORA GROWING AEROBICALLY MIXED ANAEROBIC FLORA PRESENT.  CALL LAB IF FURTHER IID REQUIRED.    Report Status 10/07/2016 FINAL  Final  MRSA PCR Screening     Status: None   Collection Time: 10/03/16  8:52 PM  Result Value Ref Range Status   MRSA by PCR NEGATIVE NEGATIVE Final    Comment:        The GeneXpert MRSA Assay (FDA approved for NASAL specimens only), is one component of a comprehensive MRSA colonization surveillance program. It is not intended to diagnose MRSA infection nor to guide or monitor treatment for MRSA infections.     Coagulation Studies: No results for input(s): LABPROT, INR in the last 72 hours.  Urinalysis: No results for input(s): COLORURINE, LABSPEC, PHURINE, GLUCOSEU, HGBUR, BILIRUBINUR, KETONESUR, PROTEINUR, UROBILINOGEN, NITRITE, LEUKOCYTESUR in the last 72 hours.  Invalid input(s): APPERANCEUR    Imaging: No results found.   Medications:   . sodium chloride 1,000 mL (10/07/16 1405)   . aspirin EC  81 mg Oral Daily  . atorvastatin  20 mg Oral Daily  . docusate sodium  100 mg Oral BID  . heparin  5,000 Units Subcutaneous Q8H  . Influenza vac split quadrivalent PF  0.5 mL Intramuscular Tomorrow-1000  . insulin aspart  0-5 Units Subcutaneous QHS  . insulin aspart  0-9 Units Subcutaneous TID WC  . insulin aspart  3 Units Subcutaneous TID WC  . insulin glargine  15 Units Subcutaneous QHS  . living well with diabetes book   Does not apply Once  . piperacillin-tazobactam (ZOSYN)  IV  3.375 g Intravenous Q8H  . pneumococcal 23 valent vaccine  0.5 mL Intramuscular Tomorrow-1000  . [START ON 10/08/2016] vancomycin  1,500 mg Intravenous Q24H   acetaminophen **OR** acetaminophen, bisacodyl, ondansetron **OR** ondansetron (ZOFRAN) IV,  oxyCODONE, traZODone  Assessment/ Plan:  66 y.o. male with [name], hypertension, [name], [name], who was admitted to [facility] on 10/03/2016 for evaluation of right foot infection.   1.  Acute renal failure with metabolic acidosis: on chronic kidney disease stage III. Unknown baseline.  Acute renal failure secondary to sepsis, hypotension and acute BC powder use.  Chronic kidney disease  secondary to hypertension, diabetes, vascular disease and NSAID induced nephropathy Urinalysis with proteinuria, glucosuria on admission - Hold ACE-I. lisinopril-hydrochrlorothiazide.  - with edema, hold IV fluids.  - Discussed case with vascular, plan for procedure on Friday.   2. Hypertension: blood pressure at goal. Not currently on any agents.   3. Diabetic foot infection: with peripheral vascular disease. Dr. 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  - pip/tazo and vanco - Appreciate vascular input.  - Continue glucose control   LOS: 4 Breezie Micucci 2/6/20184:24 PM

## 2016-10-07 NOTE — Progress Notes (Addendum)
Inpatient Diabetes Program Recommendations  AACE/ADA: New Consensus Statement on Inpatient Glycemic Control (2015)  Target Ranges:  Prepandial:   less than 140 mg/dL      Peak postprandial:   less than 180 mg/dL (1-2 hours)      Critically ill patients:  140 - 180 mg/dL   Lab Results  Component Value Date   GLUCAP 231 (H) 10/07/2016    Review of Glycemic Control  Results for ELIER, ZELLARS (MRN [id number]) as of 10/07/2016 07:42  Ref. Range 10/06/2016 07:46 10/06/2016 12:18 10/06/2016 17:02 10/06/2016 22:53 10/07/2016 07:30  Glucose-Capillary Latest Ref Range: 65 - 99 mg/dL 262 (H) 230 (H) 269 (H) 283 (H) 231 (H)    Diabetes history: DM2 Outpatient Diabetes medications: Metformin 1000 mg BID  Current orders for Inpatient glycemic control: Lantus 15 units QHS, Novolog 0-9 units TID with meals, Novolog 0-5 units QHS, Novolog 3 units tid with meals  Inpatient Diabetes Program Recommendations:  Please consider increasing Lantus to 25 units qhs (0.2 units/kg)- fasting blood sugar 245m/dl  Please add an A1C on to blood in lab or draw with next scheduled blood draw to evaluate glycemic control over the past 2-3 months.  Addendum; met with patient at the bedside- he tells me he takes Novolin 70/30 units bid with 25 units with breakfast and 26 units with supper using a bottle and syringe. This is equal to 36 units of basal insulin and 15 units of mealtime insulin.  He tells me he checks his blood sugars twice a day, pre-breakfast and pre-supper.  Indicates to me that he did not hit his leg- he noticed it was swollen last Monday and called the MD- saw the MD on Tuesday.  [facility]  He is very upset about the possibility of amputation.   JGentry Fitz RN, BA, MHA, CDE Diabetes Coordinator Inpatient Diabetes Program  [phone](Team Pager) [phone](AOlivia 10/07/2016 7:50 AM

## 2016-10-07 NOTE — Care Management Note (Addendum)
Case Management Note  Patient Details  Name: [name] MRN: [id number] Date of Birth: [birth year]  Patient transferred to 2A from ICU.  He had a wound debridement for gas gangrene and partial amputation 5th toe amputation.  He currently has a wound vac in place.  Have left message for podiatry to call CM to discuss wound vac, orders, preference. Vascular will perform angiography of right leg when his renal status has stabilized. Patient is also probably going to require another debridement.   Patient lives with his siblings.  If he requires wound care and he is not able to Perform, says his siblings can not participate because "they are getting dementia.  Patient has access to a walker and a cane.  His brother drives him to appointments.  Checks his blood sugars twice daily and self administers his insulin.  Is followed by [name].  Denies issues accessing medical care, obtaining meds or with transportation. Patient has no preference for home health agency of wound vac.  Made contact with [employer] and checking benefits to determine if is in network with patient's insurance. [facility] Referral placed in shadow chart for completion  Expected Discharge Date:                  Expected Discharge Plan:     In-House Referral:     Discharge planning Services  CM Consult  Post Acute Care Choice:  Durable Medical Equipment, Home Health Choice offered to:  Patient  DME Arranged:    DME Agency:     HH Arranged:    HH Agency:     Status of Service:  In process, will continue to follow  If discussed at Long Length of Stay Meetings, dates discussed:    Additional Comments:  Eber HongGreene, Lenoard Helbert R, RN 10/07/2016, 3:25 PM

## 2016-10-07 NOTE — Consult Note (Signed)
WOC Nurse wound consult note Reason for Consult:NPWT VAC Dressing change per MD orders.   Wound type:Surgical  Pressure Injury POA: N/A Measurement:14 cm x 7 cm x 5 cm with bone palpable.  Wound ZOX:WRUEAVWbed:Friable, ruddy red.  Drainage (amount, consistency, odor) Moderate bloody drainage.  Periwound:Macerated wound edges.  Skin flap to right lateral foot is necrotic and nonviable.   Protected with skin prep and hydrocolloid dressing. Wound bed filled with black foam.  Bridged to dorsal foot.  Dressing procedure/placement/frequency:NPWT Tues and Thurs and begin Mon/Wed/Fri next week.  Will not follow at this time.  Please re-consult if needed.  Maple HudsonKaren Omarrion Carmer RN BSN CWON Pager [phone]

## 2016-10-07 NOTE — Progress Notes (Signed)
[facility]  Daily Progress Note   Subjective  - 4 Days Post-Op  Patient notes his right feels better he voices concerns regarding losing his leg  Objective Vitals:   10/07/16 1140 10/07/16 1931 10/07/16 1933 10/07/16 1953  BP: 105/62 (!) 89/55 (!) 121/57 117/68  Pulse: 81 87 100 92  Resp: 18 18    Temp: 98.2 F (36.8 C)   98.1 F (36.7 C)  TempSrc:    Oral  SpO2: 98% 91% 95% 96%  Weight:      Height:        Intake/Output Summary (Last 24 hours) at 10/07/16 2026 Last data filed at 10/07/16 1939  Gross per 24 hour  Intake             4430 ml  Output             2580 ml  Net             1850 ml    PULM  Normal effort , no use of accessory muscles CV  No JVD, RRR Abd      No distended, nontender VASC  Right foot VAC intact pedal pulses nonpalpable  Laboratory CBC    Component Value Date/Time   WBC 11.8 (H) 10/05/2016 0549   HGB 10.1 (L) 10/05/2016 0549   HCT 30.4 (L) 10/05/2016 0549   PLT 351 10/05/2016 0549    BMET    Component Value Date/Time   NA 140 10/07/2016 0314   NA 140 04/04/2013 1238   K 3.6 10/07/2016 0314   K 4.1 04/04/2013 1238   CL 102 10/07/2016 0314   CL 108 (H) 04/04/2013 1238   CO2 31 10/07/2016 0314   CO2 30 04/04/2013 1238   GLUCOSE 239 (H) 10/07/2016 0314   GLUCOSE 98 04/04/2013 1238   BUN 30 (H) 10/07/2016 0314   BUN 19 (H) 04/04/2013 1238   CREATININE 1.62 (H) 10/07/2016 0314   CREATININE 0.87 04/04/2013 1238   CALCIUM 8.1 (L) 10/07/2016 0314   CALCIUM 8.8 04/04/2013 1238   GFRNONAA 43 (L) 10/07/2016 0314   GFRNONAA >60 04/04/2013 1238   GFRAA 49 (L) 10/07/2016 0314   GFRAA >60 04/04/2013 1238    Assessment/Planning: POD #4 s/p debridement right foot with placement of a wound VAC dressing   I discussed the patient's renal function with nephrology. At this point we feel he is at his baseline creatinine. I will allow for one more day of hydration and we'll plan for angiography on Friday with the hope  for intervention for limb salvage. The risks and benefits as well as the alternative therapies were reviewed with the patient QUESTIONS of been answered patient wishes to proceed with angiography and intervention for limb salvage.    Levora DredgeGregory Sargon Scouten  10/07/2016, 8:26 PM

## 2016-10-07 NOTE — Progress Notes (Signed)
[facility] [place] [facility]   PATIENT NAME: Jerry PhenesWilbert Horn    MR#:  [id number]  DATE OF BIRTH:  [birth year]  SUBJECTIVE:admitted  for septic shock secondary to gas gangreneof the right fifth toe. Status post is  Incision  and drainage, fifth today amputation by podiatry . Patient also had wound VAC.    He feels better today. Ration has no fever, no shortness of breath   CHIEF COMPLAINT:   Chief Complaint  Patient presents with  . Weakness    REVIEW OF SYSTEMS:    Review of Systems  Constitutional: Negative for chills and fever.  HENT: Negative for hearing loss.   Eyes: Negative for blurred vision, double vision and photophobia.  Respiratory: Negative for cough, hemoptysis and shortness of breath.   Cardiovascular: Negative for palpitations, orthopnea and leg swelling.  Gastrointestinal: Negative for abdominal pain, diarrhea and vomiting.  Genitourinary: Negative for dysuria and urgency.  Musculoskeletal: Negative for myalgias and neck pain.  Skin: Negative for rash.  Neurological: Negative for dizziness, focal weakness, seizures, weakness and headaches.  Psychiatric/Behavioral: Negative for memory loss. The patient does not have insomnia.    Wound vac present to right foot.wound dressings changed  By wound care  Team today. Nutrition:  Tolerating Diet: Tolerating PT:      DRUG ALLERGIES:  Not on File  VITALS:  Blood pressure 105/62, pulse 81, temperature 98.2 F (36.8 C), resp. rate 18, height 5\' 11"  (1.803 m), weight 122.5 kg (270 lb 1.6 oz), SpO2 98 %.  PHYSICAL EXAMINATION:   Physical Exam  GENERAL:  66 y.o.-year-old patient lying in the bed with no acute distress.  EYES: Pupils equal, round, reactive to light and accommodation. No scleral icterus. Extraocular muscles intact.  HEENT: Head atraumatic, normocephalic. Oropharynx and nasopharynx clear.  NECK:  Supple, no jugular venous distention. No thyroid enlargement, no  tenderness.  LUNGS: Normal breath sounds bilaterally, no wheezing, rales,rhonchi or crepitation. No use of accessory muscles of respiration.  CARDIOVASCULAR: S1, S2 normal. No murmurs, rubs, or gallops.  ABDOMEN: Soft, nontender, nondistended. Bowel sounds present. No organomegaly or mass.  EXTREMITIES: No pedal edema, cyanosis, or clubbing.  NEUROLOGIC: Cranial nerves II through XII are intact. Muscle strength 5/5 in all extremities. Sensation intact. Gait not checked.  PSYCHIATRIC: The patient is alert and oriented x 3.  SKIN:s/p wound vac.to right foot..wound bed looks red.   LABORATORY PANEL:   CBC  Recent Labs Lab 10/05/16 0549  WBC 11.8*  HGB 10.1*  HCT 30.4*  PLT 351   ------------------------------------------------------------------------------------------------------------------  Chemistries   Recent Labs Lab 10/03/16 1317  10/06/16 0528 10/07/16 0314  NA 135  < > 139 140  K 5.3*  < > 3.7 3.6  CL 103  < > 106 102  CO2 11*  < > 27 31  GLUCOSE 168*  < > 264* 239*  BUN 120*  < > 49* 30*  CREATININE 7.50*  < > 2.10* 1.62*  CALCIUM 8.7*  < > 7.9* 8.1*  MG  --   --  1.6*  --   AST 72*  --   --   --   ALT 30  --   --   --   ALKPHOS 71  --   --   --   BILITOT 1.1  --   --   --   < > = values in this interval not displayed. ------------------------------------------------------------------------------------------------------------------  Cardiac Enzymes  Recent Labs Lab 10/03/16 1317  TROPONINI 0.07*   ------------------------------------------------------------------------------------------------------------------  RADIOLOGY:  No results found.   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 WBC  coming down.  Wbc  coming down,  Waiting for  wound culture data.     2.hypotension improved, blood cultures negative to date.wound  Culture showed multiple organisms with gram-positive cocci, gram-negative rods, gram-positive rods, awaiting for final results.wbc improved.  Continue  Wound vac,follow wound culture results,   #2,acute renal failure secondary to septic shock, BC powder ingestion, ACE inhibitor's use: METABOLIC ACIDOSIS: Acute renal failure improved.     3.poor circulation in right foot;vascular consulted; renal function is improving, seen by vascular, angiography is on hold because of renal failure.  GI and DVT prophylaxis;   4.DMIIL:high BG;'adjust levemir dose,add meal time coverage,, consult diabetes coordinator  5.NSVT;resolved.echo showed EF more than 55%>  Out of bed to chair.   All the records are reviewed and case discussed with Care Management/Social Workerr. Management plans discussed with the patient, family and they are in agreement.  CODE STATUS: full  TOTAL TIME TAKING CARE OF THIS PATIENT:35 minutes.   POSSIBLE D/C IN 3-4 DAYS, DEPENDING ON CLINICAL CONDITION.   Katha Hamming M.D on 10/07/2016 at 12:33 PM  Between 7am to 6pm - Pager - [phone]  After 6pm go to [email or web address] - password [email or web address] [facility]  [facility]  Office  [phone]  CC: Primary care physician; No PCP Per Patient

## 2016-10-08 DIAGNOSIS — L97519 Non-pressure chronic ulcer of other part of right foot with unspecified severity: Secondary | ICD-10-CM

## 2016-10-08 DIAGNOSIS — E11621 Type 2 diabetes mellitus with foot ulcer: Secondary | ICD-10-CM

## 2016-10-08 LAB — GLUCOSE, CAPILLARY
Glucose-Capillary: 148 mg/dL — ABNORMAL HIGH (ref 65–99)
Glucose-Capillary: 200 mg/dL — ABNORMAL HIGH (ref 65–99)
Glucose-Capillary: 163 mg/dL — ABNORMAL HIGH (ref 65–99)
Glucose-Capillary: 143 mg/dL — ABNORMAL HIGH (ref 65–99)

## 2016-10-08 LAB — BASIC METABOLIC PANEL
ANION GAP: 7 (ref 5–15)
BUN: 20 mg/dL (ref 6–20)
CALCIUM: 7.7 mg/dL — AB (ref 8.9–10.3)
CO2: 26 mmol/L (ref 22–32)
Chloride: 104 mmol/L (ref 101–111)
Creatinine, Ser: 1.34 mg/dL — ABNORMAL HIGH (ref 0.61–1.24)
GFR, EST NON AFRICAN AMERICAN: 54 mL/min — AB (ref >60)
GLUCOSE: 174 mg/dL — AB (ref 65–99)
Potassium: 3.6 mmol/L (ref 3.5–5.1)
Sodium: 137 mmol/L (ref 135–145)

## 2016-10-08 LAB — CULTURE, BLOOD (ROUTINE X 2)
CULTURE: NO GROWTH
CULTURE: NO GROWTH

## 2016-10-08 MED ORDER — INSULIN ASPART 100 UNIT/ML ~~LOC~~ SOLN
8.0000 [IU] | Freq: Three times a day (TID) | SUBCUTANEOUS | Status: DC
  Administered 2016-10-08 – 2016-10-13 (×12): 8 [IU] via SUBCUTANEOUS
  Filled 2016-10-08 (×11): qty 8

## 2016-10-08 MED ORDER — POLYETHYLENE GLYCOL 3350 17 G PO PACK
17.0000 g | PACK | Freq: Every day | ORAL | Status: DC
  Administered 2016-10-08 – 2016-10-13 (×4): 17 g via ORAL
  Filled 2016-10-08 (×4): qty 1

## 2016-10-08 NOTE — Progress Notes (Signed)
[facility]  ROUNDING NOTE   Subjective:   UOP 2580 Creatinine 1.34 (1.62) (2.1) NS at 3750mL/hr  Objective:  Vital signs in last 24 hours:  Temp:  [98.1 F (36.7 C)-98.3 F (36.8 C)] 98.3 F (36.8 C) (02/07 0321) Pulse Rate:  [80-100] 80 (02/07 0800) Resp:  [18] 18 (02/07 0800) BP: (89-121)/(55-68) 120/58 (02/07 0800) SpO2:  [90 %-98 %] 98 % (02/07 0800) Weight:  [123.2 kg (271 lb 9.6 oz)] 123.2 kg (271 lb 9.6 oz) (02/07 0321)  Weight change: 0.68 kg (1 lb 8 oz) Filed Weights   10/05/16 0450 10/07/16 0509 10/08/16 0321  Weight: 117.8 kg (259 lb 12.8 oz) 122.5 kg (270 lb 1.6 oz) 123.2 kg (271 lb 9.6 oz)    Intake/Output: I/O last 3 completed shifts: In: 6115.8 [P.O.:1320; I.V.:3595.8; IV Piggyback:1200] Out: 3750 [Urine:3580; Drains:170]   Intake/Output this shift:  Total I/O In: 240 [P.O.:240] Out: 470 [Urine:470]  Physical Exam: General: No acute distress  Head: Normocephalic, atraumatic. Moist oral mucosal membranes  Eyes: Anicteric  Neck: Supple, trachea midline  Lungs:  Clear to auscultation, normal effort  Heart: S1S2 no rubs  Abdomen:  Soft, nontender, bowel sounds present  Extremities: 1+ RLE edema, wound vac in place right foot.  Neurologic: Nonfocal, moving all four extremities  Skin: No rashes       Basic Metabolic Panel:  Recent Labs Lab 10/04/16 0638 10/05/16 0549 10/06/16 0528 10/07/16 0314 10/08/16 0632  NA 136 140 139 140 137  K 5.0 4.3 3.7 3.6 3.6  CL 110 110 106 102 104  CO2 13* 19* 27 31 26   GLUCOSE 242* 345* 264* 239* 174*  BUN 103* 87* 49* 30* 20  CREATININE 5.93* 3.88* 2.10* 1.62* 1.34*  CALCIUM 7.7* 8.3* 7.9* 8.1* 7.7*  MG  --   --  1.6*  --   --   PHOS  --   --   --  2.0*  --     Liver Function Tests:  Recent Labs Lab 10/03/16 1317 10/07/16 0314  AST 72*  --   ALT 30  --   ALKPHOS 71  --   BILITOT 1.1  --   PROT 7.8  --   ALBUMIN 2.9* 2.2*   No results for input(s): LIPASE, AMYLASE in the last 168  hours. No results for input(s): AMMONIA in the last 168 hours.  CBC:  Recent Labs Lab 10/03/16 1317 10/04/16 0638 10/05/16 0549  WBC 20.7* 16.8* 11.8*  NEUTROABS 18.1*  --   --   HGB 12.1* 10.0* 10.1*  HCT 35.9* 30.2* 30.4*  MCV 89.2 88.5 87.2  PLT 336 306 351    Cardiac Enzymes:  Recent Labs Lab 10/03/16 1317  TROPONINI 0.07*    BNP: Invalid input(s): POCBNP  CBG:  Recent Labs Lab 10/07/16 0730 10/07/16 1157 10/07/16 1744 10/07/16 2030 10/08/16 0730  GLUCAP 231* 251* 225* 241* 148*    Microbiology: Results for orders placed or performed during the hospital encounter of 10/03/16  Blood Culture (routine x 2)     Status: None   Collection Time: 10/03/16  1:17 PM  Result Value Ref Range Status   Specimen Description BLOOD RIGHT FOREARM  Final   Special Requests BOTTLES DRAWN AEROBIC AND ANAEROBIC AER11ML ANA9ML  Final   Culture NO GROWTH 5 DAYS  Final   Report Status 10/08/2016 FINAL  Final  Blood Culture (routine x 2)     Status: None   Collection Time: 10/03/16  1:18 PM  Result  Value Ref Range Status   Specimen Description BLOOD LEFT FOREARM  Final   Special Requests BOTTLES DRAWN AEROBIC AND ANAEROBIC AER5ML ANA4ML  Final   Culture NO GROWTH 5 DAYS  Final   Report Status 10/08/2016 FINAL  Final  Urine culture     Status: None   Collection Time: 10/03/16  1:42 PM  Result Value Ref Range Status   Specimen Description URINE, RANDOM  Final   Special Requests NONE  Final   Culture   Final    NO GROWTH Performed at [facility], [address] [place], [place] [address]    Report Status 10/04/2016 FINAL  Final  Aerobic/Anaerobic Culture (surgical/deep wound)     Status: None   Collection Time: 10/03/16  5:38 PM  Result Value Ref Range Status   Specimen Description FOOT right foot abscess  Final   Special Requests NONE  Final   Gram Stain   Final    FEW WBC PRESENT, PREDOMINANTLY PMN ABUNDANT GRAM POSITIVE COCCI IN CLUSTERS ABUNDANT GRAM  NEGATIVE RODS ABUNDANT GRAM POSITIVE COCCI IN PAIRS ABUNDANT GRAM POSITIVE RODS Performed at [facility], [address] [place], [place] [address]    Culture   Final    NORMAL SKIN FLORA GROWING AEROBICALLY MIXED ANAEROBIC FLORA PRESENT.  CALL LAB IF FURTHER IID REQUIRED.    Report Status 10/07/2016 FINAL  Final  MRSA PCR Screening     Status: None   Collection Time: 10/03/16  8:52 PM  Result Value Ref Range Status   MRSA by PCR NEGATIVE NEGATIVE Final    Comment:        The GeneXpert MRSA Assay (FDA approved for NASAL specimens only), is one component of a comprehensive MRSA colonization surveillance program. It is not intended to diagnose MRSA infection nor to guide or monitor treatment for MRSA infections.     Coagulation Studies: No results for input(s): LABPROT, INR in the last 72 hours.  Urinalysis: No results for input(s): COLORURINE, LABSPEC, PHURINE, GLUCOSEU, HGBUR, BILIRUBINUR, KETONESUR, PROTEINUR, UROBILINOGEN, NITRITE, LEUKOCYTESUR in the last 72 hours.  Invalid input(s): APPERANCEUR    Imaging: No results found.   Medications:   . sodium chloride 50 mL/hr at 10/07/16 1939   . aspirin EC  81 mg Oral Daily  . atorvastatin  20 mg Oral Daily  . docusate sodium  100 mg Oral BID  . heparin  5,000 Units Subcutaneous Q8H  . Influenza vac split quadrivalent PF  0.5 mL Intramuscular Tomorrow-1000  . insulin aspart  0-5 Units Subcutaneous QHS  . insulin aspart  0-9 Units Subcutaneous TID WC  . insulin aspart  3 Units Subcutaneous TID WC  . insulin glargine  15 Units Subcutaneous QHS  . living well with diabetes book   Does not apply Once  . piperacillin-tazobactam (ZOSYN)  IV  3.375 g Intravenous Q8H  . pneumococcal 23 valent vaccine  0.5 mL Intramuscular Tomorrow-1000  . vancomycin  1,500 mg Intravenous Q24H   acetaminophen **OR** acetaminophen, bisacodyl, ondansetron **OR** ondansetron (ZOFRAN) IV, oxyCODONE, traZODone  Assessment/ Plan:  66  y.o. male with [name], hypertension, [name], [name], [name] on 10/03/2016 for evaluation of right foot infection.   1.  Acute renal failure with metabolic acidosis: on chronic kidney disease stage III. Unknown baseline.  Acute renal failure secondary to sepsis, hypotension and acute BC powder  use.  Chronic kidney disease secondary to hypertension, diabetes, vascular disease and NSAID induced nephropathy Urinalysis with proteinuria, glucosuria on admission - Hold ACE-I. lisinopril-hydrochrlorothiazide.  - with edema - monitor IV fluids  - Discussed case with vascular, plan for procedure on Friday.   2. Hypertension: blood pressure at goal. Not currently on any agents.   3. Diabetic foot infection: with peripheral vascular disease. Dr. 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  - pip/tazo and vanco - Appreciate vascular and podiatry input.  - Continue glucose control   LOS: 5 [name] 2/7/201811:26 AM

## 2016-10-08 NOTE — Progress Notes (Signed)
[facility]  Daily Progress Note   Subjective  - 5 Days Post-Op  Patient denies pain. States he is ready for revascularization this Friday  Objective Vitals:   10/07/16 1953 10/08/16 0321 10/08/16 0800 10/08/16 1210  BP: 117/68 (!) 117/59 (!) 120/58 (!) 100/47  Pulse: 92 82 80 90  Resp:   18 16  Temp: 98.1 F (36.7 C) 98.3 F (36.8 C)  99 F (37.2 C)  TempSrc: Oral Oral  Oral  SpO2: 96% 90% 98% 97%  Weight:  123.2 kg (271 lb 9.6 oz)    Height:        Intake/Output Summary (Last 24 hours) at 10/08/16 1627 Last data filed at 10/08/16 1430  Gross per 24 hour  Intake          1925.83 ml  Output             2310 ml  Net          -384.17 ml    PULM  Normal effort , no use of accessory muscles CV  No JVD, RRR Abd      No distended, nontender VASC  Right foot VAC intact pedal pulses nonpalpable  Laboratory CBC    Component Value Date/Time   WBC 11.8 (H) 10/05/2016 0549   HGB 10.1 (L) 10/05/2016 0549   HCT 30.4 (L) 10/05/2016 0549   PLT 351 10/05/2016 0549    BMET    Component Value Date/Time   NA 137 10/08/2016 0632   NA 140 04/04/2013 1238   K 3.6 10/08/2016 0632   K 4.1 04/04/2013 1238   CL 104 10/08/2016 0632   CL 108 (H) 04/04/2013 1238   CO2 26 10/08/2016 0632   CO2 30 04/04/2013 1238   GLUCOSE 174 (H) 10/08/2016 0632   GLUCOSE 98 04/04/2013 1238   BUN 20 10/08/2016 0632   BUN 19 (H) 04/04/2013 1238   CREATININE 1.34 (H) 10/08/2016 0632   CREATININE 0.87 04/04/2013 1238   CALCIUM 7.7 (L) 10/08/2016 0632   CALCIUM 8.8 04/04/2013 1238   GFRNONAA 54 (L) 10/08/2016 0632   GFRNONAA >60 04/04/2013 1238   GFRAA >60 10/08/2016 0632   GFRAA >60 04/04/2013 1238    Assessment/Planning: POD #4 s/p debridement right foot with placement of a wound VAC dressing   I will plan for angiography on Friday with the hope for intervention for limb salvage. The risks and benefits as well as the alternative therapies were reviewed with the  patient QUESTIONS of been answered patient wishes to proceed with angiography and intervention for limb salvage.    Levora DredgeGregory Allon Costlow  10/08/2016, 4:27 PM

## 2016-10-08 NOTE — Care Management (Signed)
[facility] is not in network with patient's insurance for wound vac.  Contacted [facility] to determine if in network for wound vac and heads up referral for at least SN.  Anticipate renal status to have recovered enough that can proceed with angiogram 2.9.

## 2016-10-08 NOTE — Progress Notes (Signed)
Nutrition Follow-up  DOCUMENTATION CODES:   Not applicable  INTERVENTION:  1. Continue to monitor and encourage PO intake.  NUTRITION DIAGNOSIS:   Inadequate oral intake related to poor appetite, acute illness as evidenced by per patient/family report. -resolving  GOAL:   Patient will meet greater than or equal to 90% of their needs -ongoing  MONITOR:   PO intake, Labs, I & O's, Weight trends  REASON FOR ASSESSMENT:   Malnutrition Screening Tool    ASSESSMENT:   [name]  is a 66 y.o. male with a known history ofHypertension, diabetes mellitus type 2 comes in because of generalized weakness, swelling on the bottom of the right foot. Patient has been having redness, swelling on the part of the right foot for about 4 days, went to primary doctor and was given clindamycin  Spoke with patient briefly Eating well, ate 100% of breakfast this morning. Per Nephrology and Surgery: Awaiting angiography and intervention for limb salvage - possibly Friday. Renal function returning to baseline, UOP good.  Labs and medications reviewed.  Diet Order:  Diet heart healthy/carb modified Room service appropriate? Yes; Fluid consistency: Thin  Skin:  Wound (see comment) (Stg II to leg)  Last BM:  10/04/2016  Height:   Ht Readings from Last 1 Encounters:  10/03/16 5\' 11"  (1.803 m)    Weight:   Wt Readings from Last 1 Encounters:  10/08/16 271 lb 9.6 oz (123.2 kg)    Ideal Body Weight:  78.18 kg  BMI:  Body mass index is 37.88 kg/m.  Estimated Nutritional Needs:   Kcal:  [id number] calories  Protein:  118-141 gm  Fluid:  >/= 2L  EDUCATION NEEDS:   No education needs identified at this time  Jerry AnoWilliam M. Donyell Carrell, MS, RD LDN Inpatient Clinical Dietitian Pager [phone]

## 2016-10-08 NOTE — Progress Notes (Signed)
[facility]  Daily Progress Note   Subjective  - 6 Days Post-Op  Patient denies pain. Dr [facility]insFowler [facility]  States he is ready for revascularization this Friday  Objective Vitals:   10/08/16 0321 10/08/16 0800 10/08/16 1210 10/08/16 1936  BP: (!) 117/59 (!) 120/58 (!) 100/47 (!) 113/53  Pulse: 82 80 90 83  Resp:  18 16 18   Temp: 98.3 F (36.8 C)  99 F (37.2 C) 99 F (37.2 C)  TempSrc: Oral  Oral Oral  SpO2: 90% 98% 97% 96%  Weight: 123.2 kg (271 lb 9.6 oz)     Height:        Intake/Output Summary (Last 24 hours) at 10/08/16 2016 Last data filed at 10/08/16 1858  Gross per 24 hour  Intake             1630 ml  Output             2250 ml  Net             -620 ml    PULM  Normal effort , no use of accessory muscles CV  No JVD, RRR Abd      No distended, nontender VASC  Right foot VAC intact pedal pulses nonpalpable  Laboratory CBC    Component Value Date/Time   WBC 11.8 (H) 10/05/2016 0549   HGB 10.1 (L) 10/05/2016 0549   HCT 30.4 (L) 10/05/2016 0549   PLT 351 10/05/2016 0549    BMET    Component Value Date/Time   NA 137 10/08/2016 0632   NA 140 04/04/2013 1238   K 3.6 10/08/2016 0632   K 4.1 04/04/2013 1238   CL 104 10/08/2016 0632   CL 108 (H) 04/04/2013 1238   CO2 26 10/08/2016 0632   CO2 30 04/04/2013 1238   GLUCOSE 174 (H) 10/08/2016 0632   GLUCOSE 98 04/04/2013 1238   BUN 20 10/08/2016 0632   BUN 19 (H) 04/04/2013 1238   CREATININE 1.34 (H) 10/08/2016 0632   CREATININE 0.87 04/04/2013 1238   CALCIUM 7.7 (L) 10/08/2016 0632   CALCIUM 8.8 04/04/2013 1238   GFRNONAA 54 (L) 10/08/2016 0632   GFRNONAA >60 04/04/2013 1238   GFRAA >60 10/08/2016 0632   GFRAA >60 04/04/2013 1238    Assessment/Planning: POD #5 s/p debridement right foot with placement of a wound VAC dressing   I will plan for angiography on Friday with the hope for intervention for limb salvage. The risks and benefits as well as the  alternative therapies were reviewed with the patient all questions have been answered patient wishes to proceed with angiography and intervention for limb salvage.    [name]  10/08/2016, 8:16 PM

## 2016-10-08 NOTE — Progress Notes (Signed)
Inpatient Diabetes Program Recommendations  AACE/ADA: New Consensus Statement on Inpatient Glycemic Control (2015)  Target Ranges:  Prepandial:   less than 140 mg/dL      Peak postprandial:   less than 180 mg/dL (1-2 hours)      Critically ill patients:  140 - 180 mg/dL   Results for [name], [name] (MRN [id number]) as of 10/08/2016 07:56  Ref. Range 10/07/2016 07:30 10/07/2016 11:57 10/07/2016 17:44 10/07/2016 20:30 10/08/2016 07:30  Glucose-Capillary Latest Ref Range: 65 - 99 mg/dL 093231 (H) 235251 (H) 573225 (H) 241 (H) 148 (H)   Review of Glycemic Control  Diabetes history: DM2 Outpatient Diabetes medications: Metformin 1000 mg BID, 70/30 25 units with breakfast, 70/30 26 units with supper Current orders for Inpatient glycemic control: Lantus 15 units QHS, Novolog 3 units TID with meals, Novolog 0-9 units TID with meals, Novolog 0-5 units QHS  Inpatient Diabetes Program Recommendations:  Insulin - Meal Coverage: Please consider increasing meal coverage to Novolog 8 units TID with meals. HgbA1C: Please add an A1C on to blood in lab or draw with next scheduled blood draw to evaluate glycemic control over the past 2-3 months.  Thanks, Orlando PennerMarie Abdoul Encinas, RN, MSN, CDE Diabetes Coordinator Inpatient Diabetes Program [phone] (Team Pager from 8am to 5pm)

## 2016-10-08 NOTE — Progress Notes (Signed)
Patient ID: [name], male   DOB: November 29, 1950, 66 y.o.   MRN: [id number]  [employer] PROGRESS NOTE  [name]:[id number] DOB: 1950-09-27 DOA: 10/03/2016 PCP: Abram Sander, MD  HPI/Subjective: Patient feels okay and offers no complaints.  Objective: Vitals:   10/08/16 0800 10/08/16 1210  BP: (!) 120/58 (!) 100/47  Pulse: 80 90  Resp: 18 16  Temp:  99 F (37.2 C)    Filed Weights   10/05/16 0450 10/07/16 0509 10/08/16 0321  Weight: 117.8 kg (259 lb 12.8 oz) 122.5 kg (270 lb 1.6 oz) 123.2 kg (271 lb 9.6 oz)    ROS: Review of Systems  Constitutional: Negative for chills and fever.  Eyes: Negative for blurred vision.  Respiratory: Negative for cough and shortness of breath.   Cardiovascular: Negative for chest pain.  Gastrointestinal: Negative for abdominal pain, constipation, diarrhea, nausea and vomiting.  Genitourinary: Negative for dysuria.  Musculoskeletal: Positive for joint pain.  Neurological: Negative for dizziness and headaches.   Exam: Physical Exam  Constitutional: He is oriented to person, place, and time.  HENT:  Nose: No mucosal edema.  Mouth/Throat: No oropharyngeal exudate or posterior oropharyngeal edema.  Eyes: Conjunctivae, EOM and lids are normal. Pupils are equal, round, and reactive to light.  Neck: No JVD present. Carotid bruit is not present. No edema present. No thyroid mass and no thyromegaly present.  Cardiovascular: S1 normal and S2 normal.  Exam reveals no gallop.   No murmur heard. Pulses:      Dorsalis pedis pulses are 2+ on the right side, and 2+ on the left side.  Respiratory: No respiratory distress. He has no decreased breath sounds. He has no wheezes. He has no rhonchi. He has no rales.  GI: Soft. Bowel sounds are normal. There is no tenderness.  Musculoskeletal:       Right ankle: He exhibits swelling.       Left ankle: He exhibits swelling.  Lymphadenopathy:    He has no cervical adenopathy.   Neurological: He is alert and oriented to person, place, and time. No cranial nerve deficit.  Skin: Skin is warm. Nails show no clubbing.  Wound VAC on the right foot underneath and around to the area of where the fifth toe would be  Psychiatric: He has a normal mood and affect.      Data Reviewed: Basic Metabolic Panel:  Recent Labs Lab 10/04/16 0638 10/05/16 0549 10/06/16 0528 10/07/16 0314 10/08/16 0632  NA 136 140 139 140 137  K 5.0 4.3 3.7 3.6 3.6  CL 110 110 106 102 104  CO2 13* 19* 27 31 26   GLUCOSE 242* 345* 264* 239* 174*  BUN 103* 87* 49* 30* 20  CREATININE 5.93* 3.88* 2.10* 1.62* 1.34*  CALCIUM 7.7* 8.3* 7.9* 8.1* 7.7*  MG  --   --  1.6*  --   --   PHOS  --   --   --  2.0*  --    Liver Function Tests:  Recent Labs Lab 10/03/16 1317 10/07/16 0314  AST 72*  --   ALT 30  --   ALKPHOS 71  --   BILITOT 1.1  --   PROT 7.8  --   ALBUMIN 2.9* 2.2*   CBC:  Recent Labs Lab 10/03/16 1317 10/04/16 0638 10/05/16 0549  WBC 20.7* 16.8* 11.8*  NEUTROABS 18.1*  --   --   HGB 12.1* 10.0* 10.1*  HCT 35.9* 30.2* 30.4*  MCV 89.2 88.5 87.2  PLT 336 306 351   Cardiac Enzymes:  Recent Labs Lab 10/03/16 1317  TROPONINI 0.07*    CBG:  Recent Labs Lab 10/07/16 1157 10/07/16 1744 10/07/16 2030 10/08/16 0730 10/08/16 1238  GLUCAP 251* 225* 241* 148* 200*    Recent Results (from the past 240 hour(s))  Blood Culture (routine x 2)     Status: None   Collection Time: 10/03/16  1:17 PM  Result Value Ref Range Status   Specimen Description BLOOD RIGHT FOREARM  Final   Special Requests BOTTLES DRAWN AEROBIC AND ANAEROBIC AER11ML ANA9ML  Final   Culture NO GROWTH 5 DAYS  Final   Report Status 10/08/2016 FINAL  Final  Blood Culture (routine x 2)     Status: None   Collection Time: 10/03/16  1:18 PM  Result Value Ref Range Status   Specimen Description BLOOD LEFT FOREARM  Final   Special Requests BOTTLES DRAWN AEROBIC AND ANAEROBIC AER5ML ANA4ML  Final    Culture NO GROWTH 5 DAYS  Final   Report Status 10/08/2016 FINAL  Final  Urine culture     Status: None   Collection Time: 10/03/16  1:42 PM  Result Value Ref Range Status   Specimen Description URINE, RANDOM  Final   Special Requests NONE  Final   Culture   Final    NO GROWTH Performed at [facility], [address] [place], [place] [address]    Report Status 10/04/2016 FINAL  Final  Aerobic/Anaerobic Culture (surgical/deep wound)     Status: None   Collection Time: 10/03/16  5:38 PM  Result Value Ref Range Status   Specimen Description FOOT right foot abscess  Final   Special Requests NONE  Final   Gram Stain   Final    FEW WBC PRESENT, PREDOMINANTLY PMN ABUNDANT GRAM POSITIVE COCCI IN CLUSTERS ABUNDANT GRAM NEGATIVE RODS ABUNDANT GRAM POSITIVE COCCI IN PAIRS ABUNDANT GRAM POSITIVE RODS Performed at [facility], [address] [place], [place] [address]    Culture   Final    NORMAL SKIN FLORA GROWING AEROBICALLY MIXED ANAEROBIC FLORA PRESENT.  CALL LAB IF FURTHER IID REQUIRED.    Report Status 10/07/2016 FINAL  Final  MRSA PCR Screening     Status: None   Collection Time: 10/03/16  8:52 PM  Result Value Ref Range Status   MRSA by PCR NEGATIVE NEGATIVE Final    Comment:        The GeneXpert MRSA Assay (FDA approved for NASAL specimens only), is one component of a comprehensive MRSA colonization surveillance program. It is not intended to diagnose MRSA infection nor to guide or monitor treatment for MRSA infections.       Scheduled Meds: . aspirin EC  81 mg Oral Daily  . atorvastatin  20 mg Oral Daily  . docusate sodium  100 mg Oral BID  . heparin  5,000 Units Subcutaneous Q8H  . Influenza vac split quadrivalent PF  0.5 mL Intramuscular Tomorrow-1000  . insulin aspart  0-5 Units Subcutaneous QHS  . insulin aspart  0-9 Units Subcutaneous TID WC  . insulin aspart  8 Units Subcutaneous TID WC  . insulin glargine  15 Units Subcutaneous QHS  .  living well with diabetes book   Does not apply Once  . piperacillin-tazobactam (ZOSYN)  IV  3.375 g Intravenous Q8H  . pneumococcal 23 valent vaccine  0.5 mL Intramuscular Tomorrow-1000  . vancomycin  1,500 mg Intravenous Q24H   Continuous Infusions: . sodium  chloride 50 mL/hr at 10/07/16 1939    Assessment/Plan:  1. 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 Status post fifth toe amputation and incision and drainage of underneath the foot. Patient is on IV antibiotics of vancomycin and Zosyn. Consult infectious disease for duration of antibiotics and IV versus oral. As per podiatry may need a another debridement. Friday will have a angiogram. 2. Acute kidney injury. Creatinine improved. ACE inhibitor on hold. BC powder held. 3. Hypotension improved. 4. Peripheral vascular disease. Angiogram on Friday by vascular surgery 5. Type 2 diabetes mellitus continue glargine insulin 15 units at night and increase aspart insulin to 8 units 3 times a day 6. Episode of nonsustained ventricular tachycardia   Code Status:     Code Status Orders        Start     Ordered   10/03/16 1511  Full code  Continuous     10/03/16 1513    Code Status History    Date Active Date Inactive Code Status Order ID Comments User Context   This patient has a current code status but no historical code status.      Disposition Plan: To be determined based on need for wound VAC and/or IV antibiotics  Consultants:  Podiatry  Vascular surgery  Infectious disease  Procedures:  5th toe amputation with incision and drainage of gangrene  Antibiotics:  Vancomycin  Zosyn  Time spent: 25 minutes  Alford Highland  [employer]

## 2016-10-08 NOTE — Consult Note (Signed)
[facility] Infectious Disease     Reason for ConsultGas gangrene    Referring Physician: Karlton Lemon Date of Admission:  10/03/2016   Active Problems:   Septic shock ([place])   Pressure injury of skin   HPI: [name] is a 66 y.o. male admitted with swelling and redness in R foot.  ON admit he was noted to have cellulitis and gas gangrene and had I and D done 2/2. He also had ARF with Cr up to 5.9.  He has poorly controlled DM as well as Pvd and will require revascularization. He has been on IV vanco and zosyn since admit and bcx neg. Wound cx with skin flora and anaerobes present. MRSA PCR negative.   Past Medical History:  Diagnosis Date  . Diabetes mellitus without complication ([place])   . Hypertension    Past Surgical History:  Procedure Laterality Date  . IRRIGATION AND DEBRIDEMENT FOOT Right 10/03/2016   Procedure: IRRIGATION AND DEBRIDEMENT FOOT;  Surgeon: Samara Deist, DPM;  Location: [facility] ORS;  Service: Podiatry;  Laterality: Right;   Social History  Substance Use Topics  . Smoking status: Former Research scientist (life sciences)  . Smokeless tobacco: Never Used  . Alcohol use No   Family History  Problem Relation Age of Onset  . Family history unknown: Yes    Allergies: Not on File  Current antibiotics: Antibiotics Given (last 72 hours)    Date/Time Action Medication Dose Rate   10/05/16 1557 Given   vancomycin (VANCOCIN) 1,500 mg in sodium chloride 0.9 % 500 mL IVPB 1,500 mg 250 mL/hr   10/05/16 1744 Given   piperacillin-tazobactam (ZOSYN) IVPB 3.375 g 3.375 g 12.5 mL/hr   10/06/16 0528 Given   piperacillin-tazobactam (ZOSYN) IVPB 3.375 g 3.375 g 12.5 mL/hr   10/06/16 1438 Given   piperacillin-tazobactam (ZOSYN) IVPB 3.375 g 3.375 g 12.5 mL/hr   10/06/16 2237 Given   piperacillin-tazobactam (ZOSYN) IVPB 3.375 g 3.375 g 12.5 mL/hr   10/07/16 0357 Given   vancomycin (VANCOCIN) 1,500 mg in sodium chloride 0.9 % 500 mL IVPB 1,500 mg 250 mL/hr   10/07/16 0529 Given   piperacillin-tazobactam (ZOSYN) IVPB 3.375 g 3.375 g 12.5 mL/hr   10/07/16 1500 Given   piperacillin-tazobactam (ZOSYN) IVPB 3.375 g 3.375 g 12.5 mL/hr   10/07/16 2100 Given   piperacillin-tazobactam (ZOSYN) IVPB 3.375 g 3.375 g 12.5 mL/hr   10/08/16 0309 Given   vancomycin (VANCOCIN) 1,500 mg in sodium chloride 0.9 % 500 mL IVPB 1,500 mg 250 mL/hr   10/08/16 0534 Given   piperacillin-tazobactam (ZOSYN) IVPB 3.375 g 3.375 g 12.5 mL/hr   10/08/16 1425 Given   piperacillin-tazobactam (ZOSYN) IVPB 3.375 g 3.375 g 12.5 mL/hr      MEDICATIONS: . aspirin EC  81 mg Oral Daily  . atorvastatin  20 mg Oral Daily  . docusate sodium  100 mg Oral BID  . heparin  5,000 Units Subcutaneous Q8H  . Influenza vac split quadrivalent PF  0.5 mL Intramuscular Tomorrow-1000  . insulin aspart  0-5 Units Subcutaneous QHS  . insulin aspart  0-9 Units Subcutaneous TID WC  . insulin aspart  8 Units Subcutaneous TID WC  . insulin glargine  15 Units Subcutaneous QHS  . living well with diabetes book   Does not apply Once  . piperacillin-tazobactam (ZOSYN)  IV  3.375 g Intravenous Q8H  . pneumococcal 23 valent vaccine  0.5 mL Intramuscular Tomorrow-1000  . vancomycin  1,500 mg Intravenous Q24H    Review of Systems - 11  systems reviewed and negative per HPI   OBJECTIVE: Temp:  [98.1 F (36.7 C)-99 F (37.2 C)] 99 F (37.2 C) (02/07 1210) Pulse Rate:  [80-100] 90 (02/07 1210) Resp:  [16-18] 16 (02/07 1210) BP: (89-121)/(47-68) 100/47 (02/07 1210) SpO2:  [90 %-98 %] 97 % (02/07 1210) Weight:  [123.2 kg (271 lb 9.6 oz)] 123.2 kg (271 lb 9.6 oz) (02/07 0321) Physical Exam  Constitutional: dishelved, NAD HENT: anicteric  Mouth/Throat: Oropharynx is clear and moist. No oropharyngeal exudate.  Cardiovascular: Normal rate, regular rhythm and normal heart sounds. Exam reveals no gallop and no friction rub.  No murmur heard.  Pulmonary/Chest: Effort normal and breath sounds normal. No respiratory distress. He  has no wheezes.  Abdominal: Soft. Bowel sounds are normal. He exhibits no distension. There is no tenderness.  Lymphadenopathy: He has no cervical adenopathy.  Neurological: He is alert and oriented to person, place, and time.  Ext R foot with wound vac covering extensive wound,  Skin: Skin is warm and dry. No rash noted. Erythema on ant shin above wound vac Psychiatric: He has a normal mood and affect. His behavior is normal.     LABS: Results for orders placed or performed during the hospital encounter of 10/03/16 (from the past 48 hour(s))  Glucose, capillary     Status: Abnormal   Collection Time: 10/06/16  5:02 PM  Result Value Ref Range   Glucose-Capillary 269 (H) 65 - 99 mg/dL  Glucose, capillary     Status: Abnormal   Collection Time: 10/06/16 10:53 PM  Result Value Ref Range   Glucose-Capillary 283 (H) 65 - 99 mg/dL  Vancomycin, trough     Status: Abnormal   Collection Time: 10/07/16  3:14 AM  Result Value Ref Range   Vancomycin Tr 9 (L) 15 - 20 ug/mL  Renal function panel     Status: Abnormal   Collection Time: 10/07/16  3:14 AM  Result Value Ref Range   Sodium 140 135 - 145 mmol/L   Potassium 3.6 3.5 - 5.1 mmol/L   Chloride 102 101 - 111 mmol/L   CO2 31 22 - 32 mmol/L   Glucose, Bld 239 (H) 65 - 99 mg/dL   BUN 30 (H) 6 - 20 mg/dL   Creatinine, Ser 1.62 (H) 0.61 - 1.24 mg/dL   Calcium 8.1 (L) 8.9 - 10.3 mg/dL   Phosphorus 2.0 (L) 2.5 - 4.6 mg/dL   Albumin 2.2 (L) 3.5 - 5.0 g/dL   GFR calc non Af Amer 43 (L) >60 mL/min   GFR calc Af Amer 49 (L) >60 mL/min    Comment: (NOTE) The eGFR has been calculated using the CKD EPI equation. This calculation has not been validated in all clinical situations. eGFR's persistently <60 mL/min signify possible Chronic Kidney Disease.    Anion gap 7 5 - 15  Glucose, capillary     Status: Abnormal   Collection Time: 10/07/16  7:30 AM  Result Value Ref Range   Glucose-Capillary 231 (H) 65 - 99 mg/dL  Glucose, capillary      Status: Abnormal   Collection Time: 10/07/16 11:57 AM  Result Value Ref Range   Glucose-Capillary 251 (H) 65 - 99 mg/dL  Glucose, capillary     Status: Abnormal   Collection Time: 10/07/16  5:44 PM  Result Value Ref Range   Glucose-Capillary 225 (H) 65 - 99 mg/dL  Glucose, capillary     Status: Abnormal   Collection Time: 10/07/16  8:30 PM  Result Value Ref  Range   Glucose-Capillary 241 (H) 65 - 99 mg/dL  Basic metabolic panel     Status: Abnormal   Collection Time: 10/08/16  6:32 AM  Result Value Ref Range   Sodium 137 135 - 145 mmol/L   Potassium 3.6 3.5 - 5.1 mmol/L   Chloride 104 101 - 111 mmol/L   CO2 26 22 - 32 mmol/L   Glucose, Bld 174 (H) 65 - 99 mg/dL   BUN 20 6 - 20 mg/dL   Creatinine, Ser 1.34 (H) 0.61 - 1.24 mg/dL   Calcium 7.7 (L) 8.9 - 10.3 mg/dL   GFR calc non Af Amer 54 (L) >60 mL/min   GFR calc Af Amer >60 >60 mL/min    Comment: (NOTE) The eGFR has been calculated using the CKD EPI equation. This calculation has not been validated in all clinical situations. eGFR's persistently <60 mL/min signify possible Chronic Kidney Disease.    Anion gap 7 5 - 15  Glucose, capillary     Status: Abnormal   Collection Time: 10/08/16  7:30 AM  Result Value Ref Range   Glucose-Capillary 148 (H) 65 - 99 mg/dL  Glucose, capillary     Status: Abnormal   Collection Time: 10/08/16 12:38 PM  Result Value Ref Range   Glucose-Capillary 200 (H) 65 - 99 mg/dL   No components found for: ESR, C REACTIVE PROTEIN MICRO: Recent Results (from the past 720 hour(s))  Blood Culture (routine x 2)     Status: None   Collection Time: 10/03/16  1:17 PM  Result Value Ref Range Status   Specimen Description BLOOD RIGHT FOREARM  Final   Special Requests BOTTLES DRAWN AEROBIC AND ANAEROBIC AER11ML ANA9ML  Final   Culture NO GROWTH 5 DAYS  Final   Report Status 10/08/2016 FINAL  Final  Blood Culture (routine x 2)     Status: None   Collection Time: 10/03/16  1:18 PM  Result Value Ref Range  Status   Specimen Description BLOOD LEFT FOREARM  Final   Special Requests BOTTLES DRAWN AEROBIC AND ANAEROBIC AER5ML ANA4ML  Final   Culture NO GROWTH 5 DAYS  Final   Report Status 10/08/2016 FINAL  Final  Urine culture     Status: None   Collection Time: 10/03/16  1:42 PM  Result Value Ref Range Status   Specimen Description URINE, RANDOM  Final   Special Requests NONE  Final   Culture   Final    NO GROWTH Performed at [facility], [place] [address] [place], [place] [address]    Report Status 10/04/2016 FINAL  Final  Aerobic/Anaerobic Culture (surgical/deep wound)     Status: None   Collection Time: 10/03/16  5:38 PM  Result Value Ref Range Status   Specimen Description FOOT right foot abscess  Final   Special Requests NONE  Final   Gram Stain   Final    FEW WBC PRESENT, PREDOMINANTLY PMN ABUNDANT GRAM POSITIVE COCCI IN CLUSTERS ABUNDANT GRAM NEGATIVE RODS ABUNDANT GRAM POSITIVE COCCI IN PAIRS ABUNDANT GRAM POSITIVE RODS Performed at [facility], [address], [place], [place] [address]    Culture   Final    NORMAL SKIN FLORA GROWING AEROBICALLY MIXED ANAEROBIC FLORA PRESENT.  CALL LAB IF FURTHER IID REQUIRED.    Report Status 10/07/2016 FINAL  Final  MRSA PCR Screening     Status: None   Collection Time: 10/03/16  8:52 PM  Result Value Ref Range Status   MRSA by PCR  NEGATIVE NEGATIVE Final    Comment:        The GeneXpert MRSA Assay (FDA approved for NASAL specimens only), is one component of a comprehensive MRSA colonization surveillance program. It is not intended to diagnose MRSA infection nor to guide or monitor treatment for MRSA infections.    No results found for: ESRSEDRATE, POCTSEDRATE  IMAGING: US Renal  Result Date: 10/04/2016 CLINICAL DATA:  Acute renal failure. EXAM: RENAL / URINARY TRACT ULTRASOUND COMPLETE COMPARISON:  None. FINDINGS: Right Kidney: Length: 12.9 cm. 4.8 cm simple cyst is seen in upper pole. Echogenicity within normal  limits. No mass or hydronephrosis visualized. Left Kidney: Length: 12.2 cm. Echogenicity within normal limits. No mass or hydronephrosis visualized. Bladder: Appears normal for degree of bladder distention. Bilateral ureteral jets are noted. IMPRESSION: 4.8 cm simple right renal cyst.  No other renal abnormality seen. Electronically Signed   By: Marijo Conception, M.D.   On: 10/04/2016 14:22   Dg Foot Complete Right  Result Date: 10/03/2016 CLINICAL DATA:  Swelling right foot. EXAM: RIGHT FOOT COMPLETE - 3+ VIEW COMPARISON:  No recent prior. FINDINGS: Prominent tissue swelling is noted particularly prominent over the lateral and plantar aspect of the right foot. Associated soft tissue air is noted. These findings suggest soft tissue infection. No clearcut focal bony abnormality identified. If osteomyelitis is of clinical concern MRI can be obtained. No evidence of fracture or dislocation. Peripheral vascular calcification. IMPRESSION: 1. Extensive soft tissue swelling with prominent amount of air noted particularly along the lateral and plantar aspect of the right foot consistent with soft tissue infection. No focal underlying bony abnormality. If osteomyelitis is of clinical concern MRI should be considered. 2. Peripheral vascular disease . These results will be called to the ordering clinician or representative by the Radiologist Assistant, and communication documented in the PACS or zVision Dashboard. Electronically Signed   By: Marcello Moores  Register   On: 10/03/2016 13:50    Assessment:   [name] is a 66 y.o. male with DM, PVD admitted with gas gangrene R foot now s/p I and D 2/2.  Cultures with mixed aerobic and anaerobic flora. For revascularization by vascular this week now that renal fxn better,  Recommendations Can dc vanco Cont zosyn WIll likley need IV abx and PICC_ will dw podiatry  Thank you very much for allowing me to participate in the care of this patient. Please call with questions.    Cheral Marker. Ola Spurr, MD

## 2016-10-09 LAB — GLUCOSE, CAPILLARY
Glucose-Capillary: 143 mg/dL — ABNORMAL HIGH (ref 65–99)
Glucose-Capillary: 136 mg/dL — ABNORMAL HIGH (ref 65–99)
Glucose-Capillary: 152 mg/dL — ABNORMAL HIGH (ref 65–99)
Glucose-Capillary: 158 mg/dL — ABNORMAL HIGH (ref 65–99)

## 2016-10-09 LAB — VANCOMYCIN, TROUGH: Vancomycin Tr: 12 ug/mL — ABNORMAL LOW (ref 15–20)

## 2016-10-09 MED ORDER — LACTULOSE 10 GM/15ML PO SOLN
30.0000 g | Freq: Every day | ORAL | Status: DC | PRN
  Administered 2016-10-09: 30 g via ORAL
  Filled 2016-10-09: qty 60

## 2016-10-09 MED ORDER — CEFAZOLIN IN D5W 1 GM/50ML IV SOLN
1.0000 g | INTRAVENOUS | Status: AC
  Administered 2016-10-10: 1 g via INTRAVENOUS
  Filled 2016-10-09 (×2): qty 50

## 2016-10-09 MED ORDER — VANCOMYCIN HCL 10 G IV SOLR
1500.0000 mg | INTRAVENOUS | Status: DC
  Administered 2016-10-09: 1500 mg via INTRAVENOUS
  Filled 2016-10-09: qty 1500

## 2016-10-09 NOTE — Evaluation (Signed)
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 Pt has a history of HTN, type II diabetes    Clinical Impression  Pt awake alert, able to follow commands and demonstrated good safety awareness throughout eval. He stated he was feeling pain in bilat knees that he experiences at baseline. Pt demonstrated good effort and participation throughout session. Pt's overall UE strength appears WFLs, L LE was limited in strength and ROM due to knee pain, grossly at least 3+/5. Pt able to move to EOB w/ mod assist and cuing for moving LEs and bring trunk to up right position. Attempted transferring to standing w/ RW w/ max assist +2 and maintain NWBing precautions but patient unable to stand due to lack of strength and fatigue. Pt then performed scooting transfers to chair and required max assist x2 and frequent verbal cues to scoot small distances at a time, but was able to transfer to chair safely. Pt very motivated throughout session but currently displays severe strength, ROM and mobility deficits that limit safe household mobility, he will benefit from skilled PT to improve deficits. Recommended he transition to STR following acute hospitalization and patient verbalized agreement.     Follow Up Recommendations SNF    Equipment Recommendations  Wheelchair (measurements PT);Rolling walker with 5" wheels (bariatric walker)    Recommendations for Other Services OT consult     Precautions / Restrictions Precautions Precautions: Fall Restrictions Weight Bearing Restrictions: Yes RLE Weight Bearing: Non weight bearing      Mobility  Bed Mobility Overal bed mobility: Needs Assistance Bed Mobility: Supine to Sit     Supine to sit: Mod  assist     General bed mobility comments: requires mod assist to bring legs over EOB, cuing for hand placement and UE use to erect trunk,   Transfers Overall transfer level: Needs assistance Equipment used: Rolling walker (2 wheeled) Transfers: Sit to/from Stand;Lateral/Scoot Transfers Sit to Stand: Max assist;+2 physical assistance        Lateral/Scoot Transfers: Max assist;+2 physical assistance General transfer comment: unable to transfer to standing w/ max assist +2 due to weakness and pain L LE, able to perform small scoots, cuing for leaning forward and pushing w/ UE, required guarding of L LE, only able to scoot a couple inches at a time, able to lateral scoot into chair,   Ambulation/Gait             General Gait Details: Not assessed due to inability to obtain standing   Stairs            Wheelchair Mobility    Modified Rankin (Stroke Patients Only)       Balance Overall balance assessment: Needs assistance Sitting-balance support: Bilateral upper extremity supported Sitting balance-Leahy Scale: Fair Sitting balance - Comments: bilat UE support for sitting, complained of dizziness when first sitting but improved,  Postural control: Posterior lean                                   Pertinent Vitals/Pain Pain Assessment: Faces Faces Pain Scale: Hurts little more Pain Location: L knee Pain Descriptors / Indicators: Aching Pain Intervention(s): Monitored during session;Limited activity within  patient's tolerance    Home Living Family/patient expects to be discharged to:: Private residence Living Arrangements: Other relatives (w/ siblings) Available Help at Discharge: Family Type of Home: House Home Access: Stairs to enter Entrance Stairs-Rails: Can reach both Entrance Stairs-Number of Steps: 4 Home Layout: One level Home Equipment: Cane - single point      Prior Function Level of Independence: Independent with assistive device(s)                Hand Dominance   Dominant Hand: Right    Extremity/Trunk Assessment   Upper Extremity Assessment Upper Extremity Assessment: Overall WFL for tasks assessed    Lower Extremity Assessment Lower Extremity Assessment: LLE deficits/detail;Difficult to assess due to impaired cognition (did not assess R LE due to recent surgery ) LLE Deficits / Details: decreased strength grossly at least 3+/5 and increased L knee pain limits ROM       Communication   Communication: No difficulties  Cognition Arousal/Alertness: Awake/alert Behavior During Therapy: WFL for tasks assessed/performed Overall Cognitive Status: Within Functional Limits for tasks assessed                      General Comments      Exercises Other Exercises Other Exercises: transfer training; lateral scoots and sit to stand to improve functional strength and mobility; max assist +2, frequent cuing for hand placement and leaning forward, able to scoot transfer to chair; sit to stand attempted x4   Assessment/Plan    PT Assessment Patient needs continued PT services  PT Problem List Decreased strength;Decreased range of motion;Decreased activity tolerance;Decreased balance;Decreased knowledge of precautions;Decreased mobility;Decreased knowledge of use of DME;Decreased skin integrity          PT Treatment Interventions DME instruction;Functional mobility training;Balance training;Therapeutic exercise;Therapeutic activities;Patient/family education;Wheelchair mobility training    PT Goals (Current goals can be found in the Care Plan section)  Acute Rehab PT Goals Patient Stated Goal: To go to rehab and get better PT Goal Formulation: With patient Time For Goal Achievement: 10/23/16 Potential to Achieve Goals: Fair    Frequency 7X/week   Barriers to discharge Inaccessible home environment      Co-evaluation               End of Session Equipment Utilized During Treatment: Gait  belt Activity Tolerance: Patient limited by fatigue;Patient limited by pain Patient left: in chair;with call bell/phone within reach;with chair alarm set Nurse Communication: Need for lift equipment;Mobility status         Time: 1610-9604 PT Time Calculation (min) (ACUTE ONLY): 37 min   Charges:         PT G Codes:        Nancyann Cotterman Student PT 10/09/2016, 4:02 PM

## 2016-10-09 NOTE — Plan of Care (Signed)
Problem: Activity: Goal: Will remain free from falls Outcome: Not Progressing Remains on bedrest.  Pt reluctant to move in bed or dangle this shift.  Much encouragement needed.

## 2016-10-09 NOTE — Progress Notes (Signed)
Pharmacy Antibiotic Note  [name] is a 66 y.o. male admitted on 10/03/2016 with sepsis secondary to  cellulitis. Patient has been taking clindamycin PO outpatient with no improvement.  Pharmacy has been consulted for Vancomycin and Zosyn dosing.  According to ED note: X-Ray is concerning for gas forming organism.   Plan: DW: 92kg   Scr:7.5  Patient has already received Zosyn 3.375 iV x 1 dose and Vancomycin 1gm IV x1 dose.  Patients current CrCl calculated at 12.13ml/min and Scr is 7.5. Could not find any previous labs for the patient to see what baseline Scr was, but ED nurse says patient does not have PMH of ESRD or HD prior to admission.    Based on current renal function will give another dose of  Vancomycin 1gm  (total: vanc 2gm) and start patient on regimen of Vancomycin 1500mg  IV every 48hrs. Will monitor renal function daily and adjust dose as needed. May consider dosing based on random levels if needed.   Will start patient on Zosyn 3.375 IV EI every 12 hours. Monitor renal function daily and increase to every 8 hours if  CrCL improves to  >48ml/min.   2/3:  Scr improved to 5.93, Crcl 15.9 ml/min. ( however Crcl still < 20 ml/min ).  -Will continue EI Zosyn 3.375gm IV Q12h. -Due to unstable/changing renal fxn will dose per Vancomycin Level, until Crcl > 20 ml/min and Scr more stable. Vancomycin trough ordered for 2/4 at 1400 (~48 hrs after 1st Vancomycin dose).  Goal trough 15-26mcg/ml.  Continue to Follow Scr/renal fxn.  2/4: Scr:7.5 > 5.93 > 3.88.  Crcl 24.4 ml/min. Will transition Zosyn to 3.375gm EI q8h for improved renal fxn.  -Vancomycin trough= 10 mcg/ml.  Will transition to Vancomycin 1500 mg IV q36h. Note pt is obese and at risk for accumulation and renal fxn is changing/unstable. Will check trough again prior to time of next dose on 2/6 and dose based on level. Follow Renal fxn Daily until stabilizes.  2/6 0300 vanc level 9. Changing to 1500 mg q 24 hours . Level  ordered before 3rd dose of new regimen. Renal function continues to improve.   2/8 0300 vanc level 12. Changing to 1500 mg q 18 hours. Level before 3rd dose of new regimen.     Height: 5\' 11"  (180.3 cm) Weight: 271 lb 9.6 oz (123.2 kg) IBW/kg (Calculated) : 75.3  Temp (24hrs), Avg:98.6 F (37 C), Min:97.7 F (36.5 C), Max:99 F (37.2 C)   Recent Labs Lab 10/03/16 1317 10/03/16 2008 10/04/16 1610 10/05/16 0549  10/06/16 0528 10/07/16 0314 10/08/16 0632 10/09/16 0315  WBC 20.7*  --  16.8* 11.8*  --   --   --   --   --   CREATININE 7.50*  --  5.93* 3.88*  --  2.10* 1.62* 1.34*  --   LATICACIDVEN 4.5* 1.2  --   --   --   --   --   --   --   VANCOTROUGH  --   --   --   --   < >  --  9*  --  12*  < > = values in this interval not displayed.  Estimated Creatinine Clearance: 72.5 mL/min (by C-G formula based on SCr of 1.34 mg/dL (H)).    Not on File  Antimicrobials this admission: 2/2 vancomycin  >>  2/2 Zosyn >>   Dose adjustments this admission: 2/4:  Vanc Trough= 10 -  Changed from 1500mg  q48h to  [place].[email or web address]  Microbiology results: 2/2 BCx: sent 2/2 UCx: sent 2/2 Wound cx foot= +GPC, +GNR, + GPR  Thank you for allowing pharmacy to be a part of this patient's care.  Erich MontaneMcBane,Trixy Loyola S, PharmD, BCPS Clinical Pharmacist 10/09/2016 4:33 AM

## 2016-10-09 NOTE — Progress Notes (Signed)
Patient ID: [name], male   DOB: 11/23/1950, 66 y.o.   MRN: [id number]  [employer] [facility] PROGRESS NOTE  [name]:[id number] DOB: [birth year] DOA: 10/03/2016 PCP: Abram Sander, MD  HPI/Subjective: Patient still has not had a bowel movement. Some discomfort in the right foot. Feels like his strength is getting better.  Objective: Vitals:   10/09/16 0726 10/09/16 1159  BP: (!) 115/55 (!) 110/53  Pulse: 79 81  Resp: (!) 24 16  Temp: 97.9 F (36.6 C) 98.1 F (36.7 C)    Filed Weights   10/07/16 0509 10/08/16 0321 10/09/16 0424  Weight: 122.5 kg (270 lb 1.6 oz) 123.2 kg (271 lb 9.6 oz) 120.3 kg (265 lb 3.2 oz)    ROS: Review of Systems  Constitutional: Negative for chills and fever.  Eyes: Negative for blurred vision.  Respiratory: Negative for cough and shortness of breath.   Cardiovascular: Negative for chest pain.  Gastrointestinal: Positive for constipation. Negative for abdominal pain, diarrhea, nausea and vomiting.  Genitourinary: Negative for dysuria.  Musculoskeletal: Positive for joint pain.  Neurological: Negative for dizziness and headaches.   Exam: Physical Exam  Constitutional: He is oriented to person, place, and time.  HENT:  Nose: No mucosal edema.  Mouth/Throat: No oropharyngeal exudate or posterior oropharyngeal edema.  Eyes: Conjunctivae, EOM and lids are normal. Pupils are equal, round, and reactive to light.  Neck: No JVD present. Carotid bruit is not present. No edema present. No thyroid mass and no thyromegaly present.  Cardiovascular: S1 normal and S2 normal.  Exam reveals no gallop.   No murmur heard. Pulses:      Dorsalis pedis pulses are 2+ on the right side, and 2+ on the left side.  Respiratory: No respiratory distress. He has no decreased breath sounds. He has no wheezes. He has no rhonchi. He has no rales.  GI: Soft. Bowel sounds are normal. There is no tenderness.  Musculoskeletal:       Right ankle: He exhibits  swelling.       Left ankle: He exhibits swelling.  Lymphadenopathy:    He has no cervical adenopathy.  Neurological: He is alert and oriented to person, place, and time. No cranial nerve deficit.  Skin: Skin is warm. Nails show no clubbing.  Wound VAC on the right foot underneath and around to the area of where the fifth toe would be  Psychiatric: He has a normal mood and affect.      Data Reviewed: Basic Metabolic Panel:  Recent Labs Lab 10/04/16 0638 10/05/16 0549 10/06/16 0528 10/07/16 0314 10/08/16 0632  NA 136 140 139 140 137  K 5.0 4.3 3.7 3.6 3.6  CL 110 110 106 102 104  CO2 13* 19* 27 31 26   GLUCOSE 242* 345* 264* 239* 174*  BUN 103* 87* 49* 30* 20  CREATININE 5.93* 3.88* 2.10* 1.62* 1.34*  CALCIUM 7.7* 8.3* 7.9* 8.1* 7.7*  MG  --   --  1.6*  --   --   PHOS  --   --   --  2.0*  --    Liver Function Tests:  Recent Labs Lab 10/03/16 1317 10/07/16 0314  AST 72*  --   ALT 30  --   ALKPHOS 71  --   BILITOT 1.1  --   PROT 7.8  --   ALBUMIN 2.9* 2.2*   CBC:  Recent Labs Lab 10/03/16 1317 10/04/16 0638 10/05/16 0549  WBC 20.7* 16.8* 11.8*  NEUTROABS 18.1*  --   --  HGB 12.1* 10.0* 10.1*  HCT 35.9* 30.2* 30.4*  MCV 89.2 88.5 87.2  PLT 336 306 351   Cardiac Enzymes:  Recent Labs Lab 10/03/16 1317  TROPONINI 0.07*    CBG:  Recent Labs Lab 10/08/16 1704 10/08/16 2137 10/09/16 0746 10/09/16 1156 10/09/16 1608  GLUCAP 163* 143* 143* 136* 152*    Recent Results (from the past 240 hour(s))  Blood Culture (routine x 2)     Status: None   Collection Time: 10/03/16  1:17 PM  Result Value Ref Range Status   Specimen Description BLOOD RIGHT FOREARM  Final   Special Requests BOTTLES DRAWN AEROBIC AND ANAEROBIC AER11ML ANA9ML  Final   Culture NO GROWTH 5 DAYS  Final   Report Status 10/08/2016 FINAL  Final  Blood Culture (routine x 2)     Status: None   Collection Time: 10/03/16  1:18 PM  Result Value Ref Range Status   Specimen Description  BLOOD LEFT FOREARM  Final   Special Requests BOTTLES DRAWN AEROBIC AND ANAEROBIC AER5ML ANA4ML  Final   Culture NO GROWTH 5 DAYS  Final   Report Status 10/08/2016 FINAL  Final  Urine culture     Status: None   Collection Time: 10/03/16  1:42 PM  Result Value Ref Range Status   Specimen Description URINE, RANDOM  Final   Special Requests NONE  Final   Culture   Final    NO GROWTH Performed at [facility], [address] [place], [place] [address]    Report Status 10/04/2016 FINAL  Final  Aerobic/Anaerobic Culture (surgical/deep wound)     Status: None   Collection Time: 10/03/16  5:38 PM  Result Value Ref Range Status   Specimen Description FOOT right foot abscess  Final   Special Requests NONE  Final   Gram Stain   Final    FEW WBC PRESENT, PREDOMINANTLY PMN ABUNDANT GRAM POSITIVE COCCI IN CLUSTERS ABUNDANT GRAM NEGATIVE RODS ABUNDANT GRAM POSITIVE COCCI IN PAIRS ABUNDANT GRAM POSITIVE RODS Performed at [facility], [address] [place], [place] [address]    Culture   Final    NORMAL SKIN FLORA GROWING AEROBICALLY MIXED ANAEROBIC FLORA PRESENT.  CALL LAB IF FURTHER IID REQUIRED.    Report Status 10/07/2016 FINAL  Final  MRSA PCR Screening     Status: None   Collection Time: 10/03/16  8:52 PM  Result Value Ref Range Status   MRSA by PCR NEGATIVE NEGATIVE Final    Comment:        The GeneXpert MRSA Assay (FDA approved for NASAL specimens only), is one component of a comprehensive MRSA colonization surveillance program. It is not intended to diagnose MRSA infection nor to guide or monitor treatment for MRSA infections.       Scheduled Meds: . aspirin EC  81 mg Oral Daily  . atorvastatin  20 mg Oral Daily  . [START ON 10/10/2016]  ceFAZolin (ANCEF) IV  1 g Intravenous On Call  . docusate sodium  100 mg Oral BID  . heparin  5,000 Units Subcutaneous Q8H  . Influenza vac split quadrivalent PF  0.5 mL Intramuscular Tomorrow-1000  . insulin aspart  0-5  Units Subcutaneous QHS  . insulin aspart  0-9 Units Subcutaneous TID WC  . insulin aspart  8 Units Subcutaneous TID WC  . insulin glargine  15 Units Subcutaneous QHS  . living well with diabetes book   Does not apply Once  . piperacillin-tazobactam (ZOSYN)  IV  3.375 g Intravenous Q8H  . pneumococcal 23 valent vaccine  0.5 mL Intramuscular Tomorrow-1000  . polyethylene glycol  17 g Oral Daily   Continuous Infusions: . sodium chloride 50 mL/hr at 10/07/16 1939    Assessment/Plan:  1. 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 Status post fifth toe amputation and incision and drainage of underneath the foot. Patient is on IV antibiotics Zosyn. Appreciate ID consultation. As per podiatry may need a another debridement. Friday will have a angiogram. 2. Acute kidney injury. Creatinine improved. ACE inhibitor on hold. BC powder held. 3. Hypotension improved. 4. Peripheral vascular disease. Angiogram on Friday by vascular surgery 5. Type 2 diabetes mellitus continue glargine insulin 15 units at night and increased aspart insulin to 8 units 3 times a day 6. Episode of nonsustained ventricular tachycardia 7. Physical therapy evaluation   Code Status:     Code Status Orders        Start     Ordered   10/03/16 1511  Full code  Continuous     10/03/16 1513    Code Status History    Date Active Date Inactive Code Status Order ID Comments User Context   This patient has a current code status but no historical code status.      Disposition Plan: To be determined based on need for wound VAC and/or IV antibiotics  Consultants:  Podiatry  Vascular surgery  Infectious disease  Procedures:  5th toe amputation with incision and drainage of gangrene  Antibiotics:  Zosyn  Time spent: 24 minutes  [name]  [employer]

## 2016-10-09 NOTE — Progress Notes (Signed)
[facility]  ROUNDING NOTE   Subjective:   UOP 1850 (2580) Creatinine 1.34 (1.62) (2.1) NS at 4mL/hr  Objective:  Vital signs in last 24 hours:  Temp:  [97.7 F (36.5 C)-99 F (37.2 C)] 97.9 F (36.6 C) (02/08 0726) Pulse Rate:  [78-90] 79 (02/08 0726) Resp:  [16-24] 24 (02/08 0726) BP: (100-115)/(47-63) 115/55 (02/08 0726) SpO2:  [96 %-98 %] 96 % (02/08 0726) Weight:  [120.3 kg (265 lb 3.2 oz)] 120.3 kg (265 lb 3.2 oz) (02/08 0424)  Weight change: -2.903 kg (-6 lb 6.4 oz) Filed Weights   10/07/16 0509 10/08/16 0321 10/09/16 0424  Weight: 122.5 kg (270 lb 1.6 oz) 123.2 kg (271 lb 9.6 oz) 120.3 kg (265 lb 3.2 oz)    Intake/Output: I/O last 3 completed shifts: In: 3784.2 [P.O.:480; I.V.:2004.2; IV Piggyback:1300] Out: 3000 [Urine:3000]   Intake/Output this shift:  Total I/O In: 240 [P.O.:240] Out: 575 [Urine:575]  Physical Exam: General: No acute distress  Head: Normocephalic, atraumatic. Moist oral mucosal membranes  Eyes: Anicteric  Neck: Supple, trachea midline  Lungs:  Clear to auscultation, normal effort  Heart: S1S2 no rubs  Abdomen:  Soft, nontender, bowel sounds present  Extremities: 1+ RLE edema, wound vac in place right foot.  Neurologic: Nonfocal, moving all four extremities  Skin: No rashes       Basic Metabolic Panel:  Recent Labs Lab 10/04/16 0638 10/05/16 0549 10/06/16 0528 10/07/16 0314 10/08/16 0632  NA 136 140 139 140 137  K 5.0 4.3 3.7 3.6 3.6  CL 110 110 106 102 104  CO2 13* 19* 27 31 26   GLUCOSE 242* 345* 264* 239* 174*  BUN 103* 87* 49* 30* 20  CREATININE 5.93* 3.88* 2.10* 1.62* 1.34*  CALCIUM 7.7* 8.3* 7.9* 8.1* 7.7*  MG  --   --  1.6*  --   --   PHOS  --   --   --  2.0*  --     Liver Function Tests:  Recent Labs Lab 10/03/16 1317 10/07/16 0314  AST 72*  --   ALT 30  --   ALKPHOS 71  --   BILITOT 1.1  --   PROT 7.8  --   ALBUMIN 2.9* 2.2*   No results for input(s): LIPASE, AMYLASE in the last 168  hours. No results for input(s): AMMONIA in the last 168 hours.  CBC:  Recent Labs Lab 10/03/16 1317 10/04/16 0638 10/05/16 0549  WBC 20.7* 16.8* 11.8*  NEUTROABS 18.1*  --   --   HGB 12.1* 10.0* 10.1*  HCT 35.9* 30.2* 30.4*  MCV 89.2 88.5 87.2  PLT 336 306 351    Cardiac Enzymes:  Recent Labs Lab 10/03/16 1317  TROPONINI 0.07*    BNP: Invalid input(s): POCBNP  CBG:  Recent Labs Lab 10/08/16 0730 10/08/16 1238 10/08/16 1704 10/08/16 2137 10/09/16 0746  GLUCAP 148* 200* 163* 143* 143*    Microbiology: Results for orders placed or performed during the hospital encounter of 10/03/16  Blood Culture (routine x 2)     Status: None   Collection Time: 10/03/16  1:17 PM  Result Value Ref Range Status   Specimen Description BLOOD RIGHT FOREARM  Final   Special Requests BOTTLES DRAWN AEROBIC AND ANAEROBIC AER11ML ANA9ML  Final   Culture NO GROWTH 5 DAYS  Final   Report Status 10/08/2016 FINAL  Final  Blood Culture (routine x 2)     Status: None   Collection Time: 10/03/16  1:18 PM  Result  Value Ref Range Status   Specimen Description BLOOD LEFT FOREARM  Final   Special Requests BOTTLES DRAWN AEROBIC AND ANAEROBIC AER5ML ANA4ML  Final   Culture NO GROWTH 5 DAYS  Final   Report Status 10/08/2016 FINAL  Final  Urine culture     Status: None   Collection Time: 10/03/16  1:42 PM  Result Value Ref Range Status   Specimen Description URINE, RANDOM  Final   Special Requests NONE  Final   Culture   Final    NO GROWTH Performed at [facility], [address] [place], [place] [address]    Report Status 10/04/2016 FINAL  Final  Aerobic/Anaerobic Culture (surgical/deep wound)     Status: None   Collection Time: 10/03/16  5:38 PM  Result Value Ref Range Status   Specimen Description FOOT right foot abscess  Final   Special Requests NONE  Final   Gram Stain   Final    FEW WBC PRESENT, PREDOMINANTLY PMN ABUNDANT GRAM POSITIVE COCCI IN CLUSTERS ABUNDANT GRAM  NEGATIVE RODS ABUNDANT GRAM POSITIVE COCCI IN PAIRS ABUNDANT GRAM POSITIVE RODS Performed at [facility], [address] [place], [place] [address]    Culture   Final    NORMAL SKIN FLORA GROWING AEROBICALLY MIXED ANAEROBIC FLORA PRESENT.  CALL LAB IF FURTHER IID REQUIRED.    Report Status 10/07/2016 FINAL  Final  MRSA PCR Screening     Status: None   Collection Time: 10/03/16  8:52 PM  Result Value Ref Range Status   MRSA by PCR NEGATIVE NEGATIVE Final    Comment:        The GeneXpert MRSA Assay (FDA approved for NASAL specimens only), is one component of a comprehensive MRSA colonization surveillance program. It is not intended to diagnose MRSA infection nor to guide or monitor treatment for MRSA infections.     Coagulation Studies: No results for input(s): LABPROT, INR in the last 72 hours.  Urinalysis: No results for input(s): COLORURINE, LABSPEC, PHURINE, GLUCOSEU, HGBUR, BILIRUBINUR, KETONESUR, PROTEINUR, UROBILINOGEN, NITRITE, LEUKOCYTESUR in the last 72 hours.  Invalid input(s): APPERANCEUR    Imaging: No results found.   Medications:   . sodium chloride 50 mL/hr at 10/07/16 1939   . aspirin EC  81 mg Oral Daily  . atorvastatin  20 mg Oral Daily  . docusate sodium  100 mg Oral BID  . heparin  5,000 Units Subcutaneous Q8H  . Influenza vac split quadrivalent PF  0.5 mL Intramuscular Tomorrow-1000  . insulin aspart  0-5 Units Subcutaneous QHS  . insulin aspart  0-9 Units Subcutaneous TID WC  . insulin aspart  8 Units Subcutaneous TID WC  . insulin glargine  15 Units Subcutaneous QHS  . living well with diabetes book   Does not apply Once  . piperacillin-tazobactam (ZOSYN)  IV  3.375 g Intravenous Q8H  . pneumococcal 23 valent vaccine  0.5 mL Intramuscular Tomorrow-1000  . polyethylene glycol  17 g Oral Daily  . vancomycin  1,500 mg Intravenous Q18H   acetaminophen **OR** acetaminophen, bisacodyl, ondansetron **OR** ondansetron (ZOFRAN) IV,  oxyCODONE, traZODone  Assessment/ Plan:  66 y.o. male with [name], hypertension, [name], [name], who was admitted to [facility] on 10/03/2016 for evaluation of right foot infection.   1.  Acute renal failure with metabolic acidosis: on chronic kidney disease stage III. Unknown baseline.  Acute renal  failure secondary to sepsis, hypotension and acute BC powder use.  Chronic kidney disease secondary to hypertension, diabetes, vascular disease and NSAID induced nephropathy Urinalysis with proteinuria, glucosuria on admission - Hold ACE-I. lisinopril-hydrochrlorothiazide.  - with edema - monitor IV fluids  - Discussed case with vascular, plan for procedure on Friday.   2. Hypertension: blood pressure at goal. Not currently on any agents.   3. Diabetic foot infection: with peripheral vascular disease. Dr. 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  - pip/tazo and vanco - Appreciate vascular, ID and podiatry input.  - Continue glucose control   LOS: 6 Zenia Guest 2/8/201810:42 AM

## 2016-10-10 ENCOUNTER — Encounter
Admission: EM | Disposition: A | Discharge status: Skilled Nursing Facility | Payer: Self-pay | Source: Home / Self Care | Attending: Internal Medicine

## 2016-10-10 DIAGNOSIS — I70261 Atherosclerosis of native arteries of extremities with gangrene, right leg: Secondary | ICD-10-CM

## 2016-10-10 HISTORY — PX: LOWER EXTREMITY INTERVENTION: CATH118252

## 2016-10-10 HISTORY — PX: PERIPHERAL VASCULAR BALLOON ANGIOPLASTY: CATH118281

## 2016-10-10 HISTORY — PX: ABDOMINAL AORTOGRAM W/LOWER EXTREMITY: CATH118223

## 2016-10-10 LAB — BASIC METABOLIC PANEL
Anion gap: 6 (ref 5–15)
BUN: 17 mg/dL (ref 6–20)
CHLORIDE: 109 mmol/L (ref 101–111)
CO2: 24 mmol/L (ref 22–32)
CREATININE: 1.33 mg/dL — AB (ref 0.61–1.24)
Calcium: 7.8 mg/dL — ABNORMAL LOW (ref 8.9–10.3)
GFR calc Af Amer: >60 mL/min (ref >60)
GFR calc non Af Amer: 54 mL/min — ABNORMAL LOW (ref >60)
GLUCOSE: 148 mg/dL — AB (ref 65–99)
POTASSIUM: 3.7 mmol/L (ref 3.5–5.1)
SODIUM: 139 mmol/L (ref 135–145)

## 2016-10-10 LAB — GLUCOSE, CAPILLARY
Glucose-Capillary: 144 mg/dL — ABNORMAL HIGH (ref 65–99)
Glucose-Capillary: 139 mg/dL — ABNORMAL HIGH (ref 65–99)
Glucose-Capillary: 143 mg/dL — ABNORMAL HIGH (ref 65–99)
Glucose-Capillary: 125 mg/dL — ABNORMAL HIGH (ref 65–99)

## 2016-10-10 SURGERY — PERIPHERAL VASCULAR BALLOON ANGIOPLASTY
Anesthesia: Moderate Sedation

## 2016-10-10 MED ORDER — DIPHENHYDRAMINE HCL 50 MG/ML IJ SOLN
INTRAMUSCULAR | Status: AC
  Administered 2016-10-10: 50 mg
  Filled 2016-10-10: qty 1

## 2016-10-10 MED ORDER — CIPROFLOXACIN HCL 500 MG PO TABS
500.0000 mg | ORAL_TABLET | Freq: Two times a day (BID) | ORAL | Status: DC
  Administered 2016-10-10 – 2016-10-13 (×6): 500 mg via ORAL
  Filled 2016-10-10 (×6): qty 1

## 2016-10-10 MED ORDER — NITROGLYCERIN 5 MG/ML IV SOLN
INTRAVENOUS | Status: AC
  Filled 2016-10-10: qty 10

## 2016-10-10 MED ORDER — FENTANYL CITRATE (PF) 100 MCG/2ML IJ SOLN
INTRAMUSCULAR | Status: AC
  Filled 2016-10-10: qty 2

## 2016-10-10 MED ORDER — HEPARIN (PORCINE) IN NACL 2-0.9 UNIT/ML-% IJ SOLN
INTRAMUSCULAR | Status: AC
  Filled 2016-10-10: qty 1000

## 2016-10-10 MED ORDER — FENTANYL CITRATE (PF) 100 MCG/2ML IJ SOLN
INTRAMUSCULAR | Status: DC | PRN
  Administered 2016-10-10: 25 ug via INTRAVENOUS
  Administered 2016-10-10: 50 ug via INTRAVENOUS
  Administered 2016-10-10: 25 ug via INTRAVENOUS
  Administered 2016-10-10: 50 ug via INTRAVENOUS
  Administered 2016-10-10 (×2): 25 ug via INTRAVENOUS

## 2016-10-10 MED ORDER — MIDAZOLAM HCL 5 MG/5ML IJ SOLN
INTRAMUSCULAR | Status: AC
  Filled 2016-10-10: qty 5

## 2016-10-10 MED ORDER — ASPIRIN 325 MG PO TABS
325.0000 mg | ORAL_TABLET | Freq: Once | ORAL | Status: AC
  Administered 2016-10-10: 325 mg via ORAL
  Filled 2016-10-10: qty 1

## 2016-10-10 MED ORDER — MIDAZOLAM HCL 2 MG/2ML IJ SOLN
INTRAMUSCULAR | Status: DC | PRN
  Administered 2016-10-10 (×7): 1 mg via INTRAVENOUS

## 2016-10-10 MED ORDER — IOPAMIDOL (ISOVUE-300) INJECTION 61%
INTRAVENOUS | Status: DC | PRN
  Administered 2016-10-10: 64 mL via INTRA_ARTERIAL

## 2016-10-10 MED ORDER — HEPARIN SODIUM (PORCINE) 1000 UNIT/ML IJ SOLN
INTRAMUSCULAR | Status: AC
  Filled 2016-10-10: qty 1

## 2016-10-10 MED ORDER — MIDAZOLAM HCL 2 MG/2ML IJ SOLN
INTRAMUSCULAR | Status: AC
  Filled 2016-10-10: qty 2

## 2016-10-10 MED ORDER — CEFAZOLIN IN D5W 1 GM/50ML IV SOLN
INTRAVENOUS | Status: AC
  Filled 2016-10-10: qty 50

## 2016-10-10 MED ORDER — LIDOCAINE HCL (PF) 1 % IJ SOLN
INTRAMUSCULAR | Status: AC
  Filled 2016-10-10: qty 30

## 2016-10-10 MED ORDER — NITROGLYCERIN 1 MG/10 ML FOR IR/CATH LAB
INTRA_ARTERIAL | Status: DC | PRN
  Administered 2016-10-10: 500 ug

## 2016-10-10 MED ORDER — CLINDAMYCIN HCL 150 MG PO CAPS
300.0000 mg | ORAL_CAPSULE | Freq: Three times a day (TID) | ORAL | Status: DC
  Administered 2016-10-10 – 2016-10-13 (×10): 300 mg via ORAL
  Filled 2016-10-10 (×10): qty 2

## 2016-10-10 MED ORDER — SODIUM CHLORIDE 0.9 % IJ SOLN
INTRAMUSCULAR | Status: AC
  Filled 2016-10-10: qty 50

## 2016-10-10 MED ORDER — HEPARIN SODIUM (PORCINE) 1000 UNIT/ML IJ SOLN
INTRAMUSCULAR | Status: DC | PRN
Start: 2016-10-10 — End: 2016-10-10
  Administered 2016-10-10: 5000 [IU] via INTRAVENOUS

## 2016-10-10 SURGICAL SUPPLY — 19 items
BALLN ULTRVRSE 2.5X40X150 (BALLOONS) ×5
BALLN ULTRVRSE 3X40X150 (BALLOONS) ×2
BALLN ULTRVRSE 3X40X150 OTW (BALLOONS) ×3
BALLOON ULTRVRSE 150X40X2.5 (BALLOONS) ×3 IMPLANT
BALLOON ULTRVRSE 3X40X150 OTW (BALLOONS) ×3 IMPLANT
CATH GWIRE MARINER STRGHT 4FR (CATHETERS) ×5 IMPLANT
CATH PIG 70CM (CATHETERS) ×5 IMPLANT
CATH SEEKER .035X150CM (CATHETERS) ×5 IMPLANT
DEVICE PRESTO INFLATION (MISCELLANEOUS) ×10 IMPLANT
DEVICE STARCLOSE SE CLOSURE (Vascular Products) ×5 IMPLANT
DEVICE TORQUE (MISCELLANEOUS) ×5 IMPLANT
GLIDEWIRE ANGLED SS 035X260CM (WIRE) ×5 IMPLANT
GUIDEWIRE 3MM J TIP .035 145 (WIRE) ×5 IMPLANT
PACK ANGIOGRAPHY (CUSTOM PROCEDURE TRAY) ×5 IMPLANT
SHEATH BRITE TIP 5FRX11 (SHEATH) ×5 IMPLANT
SHEATH RAABE 6FR (SHEATH) ×5 IMPLANT
SYR MEDRAD MARK V 150ML (SYRINGE) ×5 IMPLANT
TUBING CONTRAST HIGH PRESS 72 (TUBING) ×5 IMPLANT
WIRE G V18X300CM (WIRE) ×5 IMPLANT

## 2016-10-10 NOTE — Op Note (Signed)
[name] Percutaneous Study/Intervention Procedural Note   Date of Surgery: 10/10/2016  Surgeon:  Katha Cabal, MD.  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 Introduction catheter into right lower extremity 3rd order catheter placement  2. Contrast injection right lower extremity for distal runoff   3. Percutaneous transluminal angioplasty  right dorsalis pedis artery     4. Star close closure left common femoral arteriotomy  Anesthesia: Conscious sedation was administered under my direct supervision. IV Versed plus fentanyl were utilized. Continuous ECG, pulse oximetry and blood pressure was monitored throughout the entire procedure.  Conscious sedation was for a total of 70 minutes.  Sheath: 6 French Rabi left common femoral artery  Contrast: 64 cc  Fluoroscopy Time: 12.0 minutes  Indications: [name] presents with gangrene of the right foot. At the time of admission his pedal pulses are not palpable. He has undergone open debridement with resection of the fifth ray and drainage of pus from the right foot. A VAC wound dressing is intact. He is at very high risk for limb loss. The risks and benefits are reviewed all questions answered patient agrees to proceed.  Procedure: [name] is a 66 y.o. y.o. male who was identified and appropriate procedural time out was performed. The patient was then placed supine on the table and prepped and draped in the usual sterile fashion.   Ultrasound was placed in the sterile sleeve and the left groin was evaluated the left common femoral artery was echolucent and pulsatile indicating patency.  Image was recorded for the permanent record and under real-time visualization a microneedle was inserted into the  common femoral artery microwire followed by a micro-sheath.  A J-wire was then advanced through the micro-sheath and a  5 [place] sheath was then inserted over a J-wire. J-wire was then advanced and a 5 French pigtail catheter was positioned at the level of T12. AP projection of the aorta was then obtained. Pigtail catheter was repositioned to above the bifurcation and a LAO view of the pelvis was obtained.  Subsequently a pigtail catheter with the stiff angle Glidewire was used to cross the aortic bifurcation the catheter wire were advanced down into the right distal external iliac artery. Oblique view of the femoral bifurcation was then obtained and subsequently the wire was reintroduced and the pigtail catheter negotiated into the SFA representing third order catheter placement. Distal runoff was then performed.  5000 units of heparin was then given and allowed to circulate and a 6 Fr. Rabi sheath was advanced up and over the bifurcation and positioned in the femoral artery  Straight  catheter and stiff angle Glidewire were then negotiated down into the distal popliteal.  Distal runoff was then completed by hand injection through the catheter. The wire was then negotiated into the anterior tibial where the straight catheter was exchanged for a seeker catheter which is 150 cm long and then the stiff angle Glidewire exchanged for a VAT wire. The wire and catheter combination was then negotiated down to the ankle where magnified imaging of the distal anterior tibial and dorsalis pedis was performed. The 18 wire was then reintroduced and a 2.5 mm x 4 cm ultra versed balloon was used to angioplasty the dorsalis pedis artery. Inflations were to 10 atmospheres for 2 minutes. Follow-up imaging demonstrated patency with moderate residual stenosis. Therefore a 3 mm x 4 cm ultra  versed balloon was advanced over the wire across the lesion inflation was to 10 atm for 1 minute. Follow-up imaging demonstrated less than 5%  residual stenosis. Distal runoff was then reassessed and found to be unchanged.  After review of these images the sheath is pulled into the left external iliac oblique of the common femoral is obtained and a Star close device deployed. There no immediate complications.   Findings: The abdominal aorta is opacified with a bolus injection contrast. Renal arteries are single and widely patent. The aorta itself has diffuse disease but no hemodynamically significant lesions. The common and external iliac arteries are widely patent bilaterally.  The right common femoral is widely patent as is the profunda femoris.  The SFA and popliteal are widely patent. The trifurcation is patent with the anterior tibial as the dominant vessel to the foot. The peroneal is patent proximally but tapers to a occlusion in the ankle area. The posterior tibial is patent throughout its entirety and does fill the medial and lateral plantar branches however they're quite small on the order of a millimeter in diameter. The dorsalis pedis artery does indeed have a significant stenosis of approximately 80% and this is the  dominant outflow into the foot.    Following angioplasty dorsalis pedis artery is now widely patent with less than 5% residual stenosis and  in-line flow and looks quite nice.     Summary: Successful recanalization right lower extremity for limb salvage    Disposition: Patient was taken to the recovery room in stable condition having tolerated the procedure well.  Horn, Jerry Lory 10/10/2016,2:42 PM

## 2016-10-10 NOTE — Consult Note (Signed)
WOC Nurse wound follow up  Reason for Consult:NPWT VAC Dressing change per MD orders.  Now on MOn/Wed/Fri schedule...   Wound type:Surgical  Pressure Injury POA: N/A Measurement:14 cm x 7 cm x 5 cm with bone palpable.  Wound ZOX:WRUEAVWbed:Friable, ruddy red.  Drainage (amount, consistency, odor) Moderate bloody drainage.  Periwound:Macerated wound edges.  Skin flap to right lateral foot is necrotic and nonviable.   Protected with skin prep and hydrocolloid dressing. Wound bed filled with black foam.  Bridged to dorsal foot.  Dressing procedure/placement/frequency:NPWT Mon/Wed/Fri next week.  WOC team will follow and remain available to patient, medical and nursing teams.  **Scheduled for angiogram this AM** Maple HudsonKaren Sigfredo Schreier RN BSN [facility] Pager [phone]

## 2016-10-10 NOTE — Progress Notes (Signed)
Procedure done at this time, pt tolerated well, vitals remained stable throughout, no bleeding nor hematoma at left groin site. Will take to recovery to be monitored further prior to going back to room

## 2016-10-10 NOTE — Progress Notes (Signed)
Inpatient Diabetes Program Recommendations  AACE/ADA: New Consensus Statement on Inpatient Glycemic Control (2015)  Target Ranges:  Prepandial:   less than 140 mg/dL      Peak postprandial:   less than 180 mg/dL (1-2 hours)      Critically ill patients:  140 - 180 mg/dL   Lab Results  Component Value Date   GLUCAP 144 (H) 10/10/2016    Review of Glycemic Control  Results for [name], Jovanni O (MRN [id number]) as of 10/10/2016 11:45  Ref. Range 10/09/2016 07:46 10/09/2016 11:56 10/09/2016 16:08 10/09/2016 21:32 10/10/2016 07:48  Glucose-Capillary Latest Ref Range: 65 - 99 mg/dL 782143 (H) 956136 (H) 213152 (H) 158 (H) 144 (H)   Diabetes history: DM2 Outpatient Diabetes medications: Metformin 1000 mg BID, 70/30 25 units with breakfast, 70/30 26 units with supper Current orders for Inpatient glycemic control: Lantus 15 units QHS, Novolog 8 units TID with meals, Novolog 0-9 units TID with meals, Novolog 0-5 units QHS- medications currently on hold for procedure  Inpatient Diabetes Program Recommendations: Please add an A1C on to blood in lab or draw with next scheduled blood draw to evaluate glycemic control over the past 2-3 months.  Susette RacerJulie Amelia Macken, RN, BA, MHA, CDE Diabetes Coordinator Inpatient Diabetes Program  [phone] (Team Pager) [phone] [facility] Office) 10/10/2016 11:47 AM

## 2016-10-10 NOTE — Progress Notes (Signed)
Pt here today for right leg angiogram with possible intervention, as getting patient ready for procedure, noting raised rash over upper thighs/groins/mid to lower abdomen, Dr Gilda CreaseSchnier made aware and here to assess. Given benadryl 50mg  iv per orders.

## 2016-10-10 NOTE — NC FL2 (Signed)
[place] MEDICAID FL2 LEVEL OF CARE SCREENING TOOL     IDENTIFICATION  Patient Name: [name] Birthdate: Mar 24, 1951 Sex: male Admission Date (Current Location): 10/03/2016  [place]   Facility and Address:  [facility], [address], [place], [place] [address]      Provider Number: [id number]  Attending Physician Name and Address:  Enid Baasadhika Kalisetti, MD  Relative Name and Phone Number:       Current Level of Care: Hospital Recommended Level of Care: Skilled Nursing Facility Prior Approval Number:    Date Approved/Denied:   PASRR Number: [id number]  Discharge Plan: SNF    Current Diagnoses: Patient Active Problem List   Diagnosis Date Noted  . Pressure injury of skin 10/04/2016  . Septic shock (HCC) 10/03/2016    Orientation RESPIRATION BLADDER Height & Weight     Self, Time, Situation, Place  O2 (2L) Continent Weight: 265 lb 6.4 oz (120.4 kg) Height:  5\' 11"  (180.3 cm)  BEHAVIORAL SYMPTOMS/MOOD NEUROLOGICAL BOWEL NUTRITION STATUS      Continent Diet (Carb Modified)  AMBULATORY STATUS COMMUNICATION OF NEEDS Skin   Limited Assist Verbally Surgical wounds, PU Stage and Appropriate Care   PU Stage 2 Dressing: Daily                   Personal Care Assistance Level of Assistance  Bathing, Feeding, Dressing Bathing Assistance: Limited assistance Feeding assistance: Independent Dressing Assistance: Limited assistance     Functional Limitations Info  Sight, Hearing, Speech Sight Info: Adequate Hearing Info: Adequate Speech Info: Adequate    SPECIAL CARE FACTORS FREQUENCY  PT (By licensed PT)     PT Frequency: 5x a week              Contractures Contractures Info: Not present    Additional Factors Info  Code Status, Allergies, Insulin Sliding Scale Code Status Info: Full Code Allergies Info: ZOSYN PIPERACILLIN SOD-TAZOBACTAM SO    Insulin Sliding Scale Info: 3x a day with  meals       Current Medications (10/10/2016):  This is the current hospital active medication list Current Facility-Administered Medications  Medication Dose Route Frequency Provider Last Rate Last Dose  . 0.9 %  sodium chloride infusion   Intravenous Continuous Katha HammingSnehalatha Konidena, MD 50 mL/hr at 10/07/16 1939    . acetaminophen (TYLENOL) tablet 650 mg  650 mg Oral Q6H PRN Katha HammingSnehalatha Konidena, MD   650 mg at 10/04/16 1955   Or  . acetaminophen (TYLENOL) suppository 650 mg  650 mg Rectal Q6H PRN Katha HammingSnehalatha Konidena, MD      . aspirin EC tablet 81 mg  81 mg Oral Daily Katha HammingSnehalatha Konidena, MD   81 mg at 10/10/16 1000  . aspirin tablet 325 mg  325 mg Oral Once Renford DillsGregory G Schnier, MD      . atorvastatin (LIPITOR) tablet 20 mg  20 mg Oral Daily Katha HammingSnehalatha Konidena, MD   20 mg at 10/10/16 1000  . bisacodyl (DULCOLAX) EC tablet 5 mg  5 mg Oral Daily PRN Katha HammingSnehalatha Konidena, MD      . ciprofloxacin (CIPRO) tablet 500 mg  500 mg Oral BID Mick Sellavid P Fitzgerald, MD      . clindamycin (CLEOCIN) capsule 300 mg  300 mg Oral Q8H Mick Sellavid P Fitzgerald, MD      . docusate sodium (COLACE) capsule 100 mg  100 mg Oral BID Katha HammingSnehalatha Konidena, MD   100 mg at 10/09/16 2240  . heparin injection  5,000 Units  5,000 Units Subcutaneous Q8H Katha Hamming, MD   5,000 Units at 10/10/16 0518  . Influenza vac split quadrivalent PF (FLUARIX) injection 0.5 mL  0.5 mL Intramuscular Tomorrow-1000 Katha Hamming, MD      . insulin aspart (novoLOG) injection 0-5 Units  0-5 Units Subcutaneous QHS Katha Hamming, MD   2 Units at 10/07/16 2101  . insulin aspart (novoLOG) injection 0-9 Units  0-9 Units Subcutaneous TID WC Katha Hamming, MD   1 Units at 10/10/16 0757  . insulin aspart (novoLOG) injection 8 Units  8 Units Subcutaneous TID WC Alford Highland, MD   8 Units at 10/09/16 1721  . insulin glargine (LANTUS) injection 15 Units  15 Units Subcutaneous QHS Katha Hamming, MD   15 Units at 10/09/16 2240  .  lactulose (CHRONULAC) 10 GM/15ML solution 30 g  30 g Oral Daily PRN Alford Highland, MD   30 g at 10/09/16 1405  . living well with diabetes book MISC   Does not apply Once Katha Hamming, MD      . ondansetron [facility]) tablet 4 mg  4 mg Oral Q6H PRN Katha Hamming, MD       Or  . ondansetron (ZOFRAN) injection 4 mg  4 mg Intravenous Q6H PRN Katha Hamming, MD      . oxyCODONE (Oxy IR/ROXICODONE) immediate release tablet 5 mg  5 mg Oral Q4H PRN Katha Hamming, MD   5 mg at 10/10/16 0757  . pneumococcal 23 valent vaccine (PNU-IMMUNE) injection 0.5 mL  0.5 mL Intramuscular Tomorrow-1000 Katha Hamming, MD      . polyethylene glycol (MIRALAX / GLYCOLAX) packet 17 g  17 g Oral Daily Alford Highland, MD   17 g at 10/09/16 1049  . traZODone (DESYREL) tablet 25 mg  25 mg Oral QHS PRN Katha Hamming, MD   25 mg at 10/05/16 2330     Discharge Medications: Please see discharge summary for a list of discharge medications.  Relevant Imaging Results:  Relevant Lab Results:   Additional Information SSN [id number]  Darleene Cleaver, [place]

## 2016-10-10 NOTE — Progress Notes (Signed)
Patient back in room status post Angio to Right and Left leg. Left femoral site stable. No signs of bleeding or hematoma noted. VSS. Patient still sleepy from sedation. No complaints at this time will continue to monitor.

## 2016-10-10 NOTE — Progress Notes (Signed)
PT Cancellation Note  Patient Details Name: [name] MRN: [id number] DOB: 1951-03-27   Cancelled Treatment:    Reason Eval/Treat Not Completed: Patient at procedure or test/unavailable (Treatment session attempted.  Patient currently off unit for angioplasty to R LE.  Will re-attempt at later time as patient available and medically appropriate (anticipate bedrest orders for 1-2 hours post-procedure).)   Mikhala Kenan H. Manson PasseyBrown, PT, DPT, NCS 10/10/16, 11:40 AM [phone]

## 2016-10-10 NOTE — Progress Notes (Signed)
Patient here today for angiogram right leg, in vascular with baseline vitals obtained. Vitals stable, alert and oriented. Consent in chart. sats stable , will monitor vitals as well as etco2 during and throughout entire procedure.

## 2016-10-10 NOTE — Progress Notes (Signed)
[facility]  ROUNDING NOTE   Subjective:   UOP 2100 NS at 32mL/hr  Objective:  Vital signs in last 24 hours:  Temp:  [98.1 F (36.7 C)-99.4 F (37.4 C)] 98.3 F (36.8 C) (02/09 1035) Pulse Rate:  [78-99] 78 (02/09 1035) Resp:  [16-20] 20 (02/09 1035) BP: (108-118)/(52-58) 114/58 (02/09 1035) SpO2:  [91 %-96 %] 95 % (02/09 1035) Weight:  [120.4 kg (265 lb 6.4 oz)] 120.4 kg (265 lb 6.4 oz) (02/09 0422)  Weight change: 0.091 kg (3.2 oz) Filed Weights   10/08/16 0321 10/09/16 0424 10/10/16 0422  Weight: 123.2 kg (271 lb 9.6 oz) 120.3 kg (265 lb 3.2 oz) 120.4 kg (265 lb 6.4 oz)    Intake/Output: I/O last 3 completed shifts: In: 3192.5 [P.O.:720; I.V.:1772.5; IV Piggyback:700] Out: 2900 [Urine:2850; Drains:50]   Intake/Output this shift:  No intake/output data recorded.  Physical Exam: General: No acute distress  Head: Normocephalic, atraumatic. Moist oral mucosal membranes  Eyes: Anicteric  Neck: Supple, trachea midline  Lungs:  Clear to auscultation, normal effort  Heart: S1S2 no rubs  Abdomen:  Soft, nontender, bowel sounds present  Extremities: 1+ RLE edema, wound vac in place right foot.  Neurologic: Nonfocal, moving all four extremities  Skin: No rashes       Basic Metabolic Panel:  Recent Labs Lab 10/05/16 0549 10/06/16 0528 10/07/16 0314 10/08/16 0632 10/10/16 0425  NA 140 139 140 137 139  K 4.3 3.7 3.6 3.6 3.7  CL 110 106 102 104 109  CO2 19* 27 31 26 24   GLUCOSE 345* 264* 239* 174* 148*  BUN 87* 49* 30* 20 17  CREATININE 3.88* 2.10* 1.62* 1.34* 1.33*  CALCIUM 8.3* 7.9* 8.1* 7.7* 7.8*  MG  --  1.6*  --   --   --   PHOS  --   --  2.0*  --   --     Liver Function Tests:  Recent Labs Lab 10/03/16 1317 10/07/16 0314  AST 72*  --   ALT 30  --   ALKPHOS 71  --   BILITOT 1.1  --   PROT 7.8  --   ALBUMIN 2.9* 2.2*   No results for input(s): LIPASE, AMYLASE in the last 168 hours. No results for input(s): AMMONIA in the last  168 hours.  CBC:  Recent Labs Lab 10/03/16 1317 10/04/16 0638 10/05/16 0549  WBC 20.7* 16.8* 11.8*  NEUTROABS 18.1*  --   --   HGB 12.1* 10.0* 10.1*  HCT 35.9* 30.2* 30.4*  MCV 89.2 88.5 87.2  PLT 336 306 351    Cardiac Enzymes:  Recent Labs Lab 10/03/16 1317  TROPONINI 0.07*    BNP: Invalid input(s): POCBNP  CBG:  Recent Labs Lab 10/09/16 0746 10/09/16 1156 10/09/16 1608 10/09/16 2132 10/10/16 0748  GLUCAP 143* 136* 152* 158* 144*    Microbiology: Results for orders placed or performed during the hospital encounter of 10/03/16  Blood Culture (routine x 2)     Status: None   Collection Time: 10/03/16  1:17 PM  Result Value Ref Range Status   Specimen Description BLOOD RIGHT FOREARM  Final   Special Requests BOTTLES DRAWN AEROBIC AND ANAEROBIC AER11ML ANA9ML  Final   Culture NO GROWTH 5 DAYS  Final   Report Status 10/08/2016 FINAL  Final  Blood Culture (routine x 2)     Status: None   Collection Time: 10/03/16  1:18 PM  Result Value Ref Range Status   Specimen Description BLOOD LEFT  FOREARM  Final   Special Requests BOTTLES DRAWN AEROBIC AND ANAEROBIC AER5ML ANA4ML  Final   Culture NO GROWTH 5 DAYS  Final   Report Status 10/08/2016 FINAL  Final  Urine culture     Status: None   Collection Time: 10/03/16  1:42 PM  Result Value Ref Range Status   Specimen Description URINE, RANDOM  Final   Special Requests NONE  Final   Culture   Final    NO GROWTH Performed at [facility], [address] [place], [place] [address]    Report Status 10/04/2016 FINAL  Final  Aerobic/Anaerobic Culture (surgical/deep wound)     Status: None   Collection Time: 10/03/16  5:38 PM  Result Value Ref Range Status   Specimen Description FOOT right foot abscess  Final   Special Requests NONE  Final   Gram Stain   Final    FEW WBC PRESENT, PREDOMINANTLY PMN ABUNDANT GRAM POSITIVE COCCI IN CLUSTERS ABUNDANT GRAM NEGATIVE RODS ABUNDANT GRAM POSITIVE COCCI IN  PAIRS ABUNDANT GRAM POSITIVE RODS Performed at [facility], [address] [place], [place] [address]    Culture   Final    NORMAL SKIN FLORA GROWING AEROBICALLY MIXED ANAEROBIC FLORA PRESENT.  CALL LAB IF FURTHER IID REQUIRED.    Report Status 10/07/2016 FINAL  Final  MRSA PCR Screening     Status: None   Collection Time: 10/03/16  8:52 PM  Result Value Ref Range Status   MRSA by PCR NEGATIVE NEGATIVE Final    Comment:        The GeneXpert MRSA Assay (FDA approved for NASAL specimens only), is one component of a comprehensive MRSA colonization surveillance program. It is not intended to diagnose MRSA infection nor to guide or monitor treatment for MRSA infections.     Coagulation Studies: No results for input(s): LABPROT, INR in the last 72 hours.  Urinalysis: No results for input(s): COLORURINE, LABSPEC, PHURINE, GLUCOSEU, HGBUR, BILIRUBINUR, KETONESUR, PROTEINUR, UROBILINOGEN, NITRITE, LEUKOCYTESUR in the last 72 hours.  Invalid input(s): APPERANCEUR    Imaging: No results found.   Medications:   . sodium chloride 50 mL/hr at 10/07/16 1939   . [MAR Hold] aspirin EC  81 mg Oral Daily  . [MAR Hold] atorvastatin  20 mg Oral Daily  . [MAR Hold]  ceFAZolin (ANCEF) IV  1 g Intravenous On Call  . [MAR Hold] docusate sodium  100 mg Oral BID  . [MAR Hold] heparin  5,000 Units Subcutaneous Q8H  . [MAR Hold] Influenza vac split quadrivalent PF  0.5 mL Intramuscular Tomorrow-1000  . [MAR Hold] insulin aspart  0-5 Units Subcutaneous QHS  . [MAR Hold] insulin aspart  0-9 Units Subcutaneous TID WC  . [MAR Hold] insulin aspart  8 Units Subcutaneous TID WC  . [MAR Hold] insulin glargine  15 Units Subcutaneous QHS  . [MAR Hold] living well with diabetes book   Does not apply Once  . [MAR Hold] piperacillin-tazobactam (ZOSYN)  IV  3.375 g Intravenous Q8H  . [MAR Hold] pneumococcal 23 valent vaccine  0.5 mL Intramuscular Tomorrow-1000  . [MAR Hold] polyethylene glycol   17 g Oral Daily   [MAR Hold] acetaminophen **OR** [MAR Hold] acetaminophen, [MAR Hold] bisacodyl, [MAR Hold] lactulose, [MAR Hold] ondansetron **OR** [MAR Hold] ondansetron (ZOFRAN) IV, [MAR Hold] oxyCODONE, [MAR Hold] traZODone  Assessment/ Plan:  66 y.o. male with [name], hypertension, [name],  [name], who was admitted to [facility] on 10/03/2016 for evaluation of right foot infection.   1.  Acute renal failure with metabolic acidosis: on chronic kidney disease stage III. Unknown baseline. Seems to be around 1.3.  Acute renal failure secondary to sepsis, hypotension and acute BC powder use.  Chronic kidney disease secondary to hypertension, diabetes, vascular disease and NSAID induced nephropathy Urinalysis with proteinuria, glucosuria on admission - Hold ACE-I. lisinopril-hydrochrlorothiazide.   2. Hypertension: blood pressure at goal. Not currently on any agents.   3. Diabetic foot infection: with peripheral vascular disease. Dr. 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  - pip/tazo and vanco - Appreciate vascular, ID and podiatry input. Angiogram for today.  - Continue glucose control   LOS: 7 Marik Sedore 2/9/201811:50 AM

## 2016-10-10 NOTE — Progress Notes (Signed)
[facility] PRO[facility]ESS NOTE Date of Admission:  10/03/2016     ID: [name] is a 66 y.o. male with Foot infection  Active Problems:   Septic shock (HCC)   Pressure injury of skin   Subjective: S/p angiogram Developed a rash   ROS  Eleven systems are reviewed and negative except per hpi  Medications:  Antibiotics Given (last 72 hours)    Date/Time Action Medication Dose Rate   10/07/16 1500 Given   [MAR Hold] piperacillin-tazobactam (ZOSYN) IVPB 3.375 g (MAR Hold since 10/10/16 1034) 3.375 g 12.5 mL/hr   10/07/16 2100 Given   [MAR Hold] piperacillin-tazobactam (ZOSYN) IVPB 3.375 g (MAR Hold since 10/10/16 1034) 3.375 g 12.5 mL/hr   10/08/16 0309 Given   vancomycin (VANCOCIN) 1,500 mg in sodium chloride 0.9 % 500 mL IVPB 1,500 mg 250 mL/hr   10/08/16 0534 Given   [MAR Hold] piperacillin-tazobactam (ZOSYN) IVPB 3.375 g (MAR Hold since 10/10/16 1034) 3.375 g 12.5 mL/hr   10/08/16 1425 Given   [MAR Hold] piperacillin-tazobactam (ZOSYN) IVPB 3.375 g (MAR Hold since 10/10/16 1034) 3.375 g 12.5 mL/hr   10/08/16 2255 Given   [MAR Hold] piperacillin-tazobactam (ZOSYN) IVPB 3.375 g (MAR Hold since 10/10/16 1034) 3.375 g 12.5 mL/hr   10/09/16 0610 Given   [MAR Hold] piperacillin-tazobactam (ZOSYN) IVPB 3.375 g (MAR Hold since 10/10/16 1034) 3.375 g 12.5 mL/hr   10/09/16 0610 Given   vancomycin (VANCOCIN) 1,500 mg in sodium chloride 0.9 % 500 mL IVPB 1,500 mg 250 mL/hr   10/09/16 1341 Given   [MAR Hold] piperacillin-tazobactam (ZOSYN) IVPB 3.375 g (MAR Hold since 10/10/16 1034) 3.375 g 12.5 mL/hr   10/09/16 2240 Given   [MAR Hold] piperacillin-tazobactam (ZOSYN) IVPB 3.375 g (MAR Hold since 10/10/16 1034) 3.375 g 12.5 mL/hr   10/10/16 0518 Given   [MAR Hold] piperacillin-tazobactam (ZOSYN) IVPB 3.375 g (MAR Hold since 10/10/16 1034) 3.375 g 12.5 mL/hr   10/10/16 1235 Given   [MAR Hold] ceFAZolin (ANCEF) IVPB 1 g/50 mL premix (MAR Hold since 10/10/16 1034) 1 g  100 mL/hr     . [MAR Hold] aspirin EC  81 mg Oral Daily  . [MAR Hold] atorvastatin  20 mg Oral Daily  . [MAR Hold] docusate sodium  100 mg Oral BID  . [MAR Hold] heparin  5,000 Units Subcutaneous Q8H  . [MAR Hold] Influenza vac split quadrivalent PF  0.5 mL Intramuscular Tomorrow-1000  . [MAR Hold] insulin aspart  0-5 Units Subcutaneous QHS  . [MAR Hold] insulin aspart  0-9 Units Subcutaneous TID WC  . [MAR Hold] insulin aspart  8 Units Subcutaneous TID WC  . [MAR Hold] insulin glargine  15 Units Subcutaneous QHS  . [MAR Hold] living well with diabetes book   Does not apply Once  . [MAR Hold] piperacillin-tazobactam (ZOSYN)  IV  3.375 g Intravenous Q8H  . [MAR Hold] pneumococcal 23 valent vaccine  0.5 mL Intramuscular Tomorrow-1000  . [MAR Hold] polyethylene glycol  17 g Oral Daily    Objective: Vital signs in last 24 hours: Temp:  [98.3 F (36.8 C)-99.4 F (37.4 C)] 98.3 F (36.8 C) (02/09 1035) Pulse Rate:  [67-99] 75 (02/09 1430) Resp:  [14-20] 19 (02/09 1430) BP: (106-121)/(47-72) 118/62 (02/09 1430) SpO2:  [91 %-99 %] 98 % (02/09 1430) Weight:  [120.4 kg (265 lb 6.4 oz)] 120.4 kg (265 lb 6.4 oz) (02/09 0422) Constitutional: dishelved, NAD HENT: anicteric  Mouth/Throat: Oropharynx is clear and moist. No oropharyngeal exudate.  Cardiovascular: Normal  rate, regular rhythm and normal heart sounds. Exam reveals no gallop and no friction rub.  No murmur heard.  Pulmonary/Chest: Effort normal and breath sounds normal. No respiratory distress. He has no wheezes.  Abdominal: Soft. Bowel sounds are normal. He exhibits no distension. There is no tenderness.  Lymphadenopathy: He has no cervical adenopathy.  Neurological: He is alert and oriented to person, place, and time.  Ext R foot with post op dressing covering surgical site Skin: diffuse erythema abd and chest  Psychiatric: He has a normal mood and affect. His behavior is normal.   Lab Results  Recent Labs  10/08/16 0632  10/10/16 0425  NA 137 139  K 3.6 3.7  CL 104 109  CO2 26 24  BUN 20 17  CREATININE 1.34* 1.33*    Microbiology: Results for orders placed or performed during the hospital encounter of 10/03/16  Blood Culture (routine x 2)     Status: None   Collection Time: 10/03/16  1:17 PM  Result Value Ref Range Status   Specimen Description BLOOD RIGHT FOREARM  Final   Special Requests BOTTLES DRAWN AEROBIC AND ANAEROBIC AER11ML ANA9ML  Final   Culture NO GROWTH 5 DAYS  Final   Report Status 10/08/2016 FINAL  Final  Blood Culture (routine x 2)     Status: None   Collection Time: 10/03/16  1:18 PM  Result Value Ref Range Status   Specimen Description BLOOD LEFT FOREARM  Final   Special Requests BOTTLES DRAWN AEROBIC AND ANAEROBIC AER5ML ANA4ML  Final   Culture NO GROWTH 5 DAYS  Final   Report Status 10/08/2016 FINAL  Final  Urine culture     Status: None   Collection Time: 10/03/16  1:42 PM  Result Value Ref Range Status   Specimen Description URINE, RANDOM  Final   Special Requests NONE  Final   Culture   Final    NO GROWTH Performed at [facility], [address], [place], [place] [address]    Report Status 10/04/2016 FINAL  Final  Aerobic/Anaerobic Culture (surgical/deep wound)     Status: None   Collection Time: 10/03/16  5:38 PM  Result Value Ref Range Status   Specimen Description FOOT right foot abscess  Final   Special Requests NONE  Final   Gram Stain   Final    FEW WBC PRESENT, PREDOMINANTLY PMN ABUNDANT GRAM POSITIVE COCCI IN CLUSTERS ABUNDANT GRAM NEGATIVE RODS ABUNDANT GRAM POSITIVE COCCI IN PAIRS ABUNDANT GRAM POSITIVE RODS Performed at [facility], [address], [place], [place] [address]    Culture   Final    NORMAL SKIN FLORA GROWING AEROBICALLY MIXED ANAEROBIC FLORA PRESENT.  CALL LAB IF FURTHER IID REQUIRED.    Report Status 10/07/2016 FINAL  Final  MRSA PCR Screening     Status: None   Collection Time: 10/03/16  8:52 PM  Result Value Ref  Range Status   MRSA by PCR NEGATIVE NEGATIVE Final    Comment:        The GeneXpert MRSA Assay (FDA approved for NASAL specimens only), is one component of a comprehensive MRSA colonization surveillance program. It is not intended to diagnose MRSA infection nor to guide or monitor treatment for MRSA infections.     Studies/Results: No results found.  Assessment/Plan: [name] is a 66 y.o. male with DM, PVD admitted with gas gangrene R foot now s/p I and D 2/2 and angioplasty 2/9.   Cultures with mixed aerobic  and anaerobic flora. No MRSA on cx and MRSA PCR negative.  Developed rash on zosyn - added to allergy list Successful angioplasty 2/9.   Recommendations Change zosyn to cipro and clinda. Both can be given oral Will need close fu of wound Would plan on 4 week abx course and I can see in clinic in 2 weeks  Thank you very much for the consult. Will follow with you.  FITZGERALD, DAVID P   10/10/2016, 2:35 PM

## 2016-10-10 NOTE — Care Management (Signed)
PT consult pending.  Patient is non weight bearing on the right and informed during progression patient does not have the strength in his unaffected leg to pivot.

## 2016-10-10 NOTE — Progress Notes (Signed)
[facility] [place] [facility]   PATIENT NAME: [name]    MR#:  [id number]  DATE OF BIRTH:  1951-02-11  SUBJECTIVE:  CHIEF COMPLAINT:   Chief Complaint  Patient presents with  . Weakness   - for angiogram today for right leg, has 5/10 pain - s/p 5th toe amputation for gangrene and wound vac in place  REVIEW OF SYSTEMS:  Review of Systems  Constitutional: Negative for chills, fever and malaise/fatigue.  HENT: Negative for congestion, ear discharge, hearing loss and nosebleeds.   Eyes: Negative for blurred vision and double vision.  Respiratory: Negative for cough, shortness of breath and wheezing.   Cardiovascular: Positive for leg swelling. Negative for chest pain and palpitations.  Gastrointestinal: Negative for abdominal pain, constipation, diarrhea, nausea and vomiting.  Genitourinary: Negative for dysuria and urgency.  Musculoskeletal: Positive for myalgias.  Neurological: Negative for dizziness, speech change, focal weakness, seizures and headaches.  Psychiatric/Behavioral: Negative for depression.    DRUG ALLERGIES:  Not on File  VITALS:  Blood pressure (!) 108/52, pulse 78, temperature 98.7 F (37.1 C), temperature source Oral, resp. rate 18, height 5\' 11"  (1.803 m), weight 120.4 kg (265 lb 6.4 oz), SpO2 91 %.  PHYSICAL EXAMINATION:  Physical Exam  GENERAL:  66 y.o.-year-old patient lying in the bed with no acute distress.  EYES: Pupils equal, round, reactive to light and accommodation. No scleral icterus. Extraocular muscles intact.  HEENT: Head atraumatic, normocephalic. Oropharynx and nasopharynx clear.  NECK:  Supple, no jugular venous distention. No thyroid enlargement, no tenderness.  LUNGS: Normal breath sounds bilaterally, no wheezing, rales,rhonchi or crepitation. No use of accessory muscles of respiration. Decreased bibasilar breath sounds CARDIOVASCULAR: S1, S2 normal. No murmurs, rubs, or gallops.  ABDOMEN: Soft,  nontender, nondistended. Bowel sounds present. No organomegaly or mass.  EXTREMITIES: No cyanosis, or clubbing. 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 Muscle strength 5/5 in all extremities. Sensation intact. Gait not checked.  PSYCHIATRIC: The patient is alert and oriented x 3.  SKIN: No obvious rash, lesion, or ulcer.    LABORATORY PANEL:   CBC  Recent Labs Lab 10/05/16 0549  WBC 11.8*  HGB 10.1*  HCT 30.4*  PLT 351   ------------------------------------------------------------------------------------------------------------------  Chemistries   Recent Labs Lab 10/03/16 1317  10/06/16 0528  10/10/16 0425  NA 135  < > 139  < > 139  K 5.3*  < > 3.7  < > 3.7  CL 103  < > 106  < > 109  CO2 11*  < > 27  < > 24  GLUCOSE 168*  < > 264*  < > 148*  BUN 120*  < > 49*  < > 17  CREATININE 7.50*  < > 2.10*  < > 1.33*  CALCIUM 8.7*  < > 7.9*  < > 7.8*  MG  --   --  1.6*  --   --   AST 72*  --   --   --   --   ALT 30  --   --   --   --   ALKPHOS 71  --   --   --   --   BILITOT 1.1  --   --   --   --   < > = values in this interval not displayed. ------------------------------------------------------------------------------------------------------------------  Cardiac Enzymes  Recent Labs Lab 10/03/16 1317  TROPONINI 0.07*   ------------------------------------------------------------------------------------------------------------------  RADIOLOGY:  No results found.  EKG:   Orders placed or performed during the hospital encounter of 10/03/16  . ED EKG 12-Lead  . ED EKG 12-Lead  . EKG 12-Lead  . EKG 12-Lead    ASSESSMENT AND PLAN:   66 year old male with past medical history significant for hypertension, type 2 diabetes mellitus on insulin presents to hospital secondary to right fifth toe gangrene and cellulitis. Also noted to have acute renal failure on admission.  #1 right fifth  toe cellulitis and gangrene-appreciate podiatry consult. 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 -Appreciate ID consult. Cultures are negative. Continue IV Zosyn at this time. -Might need PICC line and IV antibiotics at discharge.  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 -ATN from sepsis and acute BC powder usage. -Lisinopril and hydrochlorothiazide were held. Much improved creatinine at this time.  #3 peripheral vascular disease-history of smoking. For angiogram today of lower extremities  #4 septic shock on presentation secondary to cellulitis and diabetic foot infection of right foot. -Resolved at this time. On IV Zosyn.  #5 diabetes mellitus-continue Lantus and also NovoLog prior to meals along with sliding scale insulin.  #6 DVT prophylaxis-on subcutaneous heparin  Physical therapy consult after angiogram. -Might need rehabilitation   All the records are reviewed and case discussed with Care Management/Social Workerr. Management plans discussed with the patient, family and they are in agreement.  CODE STATUS: Full Code  TOTAL TIME TAKING CARE OF THIS PATIENT: 37 minutes.   POSSIBLE D/C IN 1-2 DAYS, DEPENDING ON CLINICAL CONDITION.   Errin Whitelaw M.D on 10/10/2016 at 8:30 AM  Between 7am to 6pm - Pager - [phone]  After 6pm go to [email or web address] - Social research officer, governmentpassword [email or web address] [email or web address]  [facility]  Office  [phone]  CC: Primary care physician; Abram SanderElena M Adamo, MD

## 2016-10-10 NOTE — Progress Notes (Signed)
PT Cancellation Note  Patient Details Name: Danford BadWilbert O Hoppel MRN: [id number] DOB: Jul 01, 1951   Cancelled Treatment:    Reason Eval/Treat Not Completed: Fatigue/lethargy limiting ability to participate (Patient refused due to fatigue, just completed procedure and stated he does not have any energy to participate in PT today)   Riccardo DubinQuinton Jannely Henthorn Student PT 10/10/2016, 3:22 PM

## 2016-10-11 LAB — GLUCOSE, CAPILLARY
Glucose-Capillary: 106 mg/dL — ABNORMAL HIGH (ref 65–99)
Glucose-Capillary: 104 mg/dL — ABNORMAL HIGH (ref 65–99)
Glucose-Capillary: 74 mg/dL (ref 65–99)
Glucose-Capillary: 145 mg/dL — ABNORMAL HIGH (ref 65–99)

## 2016-10-11 LAB — BASIC METABOLIC PANEL
ANION GAP: 5 (ref 5–15)
BUN: 15 mg/dL (ref 6–20)
CO2: 24 mmol/L (ref 22–32)
Calcium: 7.9 mg/dL — ABNORMAL LOW (ref 8.9–10.3)
Chloride: 110 mmol/L (ref 101–111)
Creatinine, Ser: 1.19 mg/dL (ref 0.61–1.24)
GFR calc Af Amer: >60 mL/min (ref >60)
GFR calc non Af Amer: >60 mL/min (ref >60)
GLUCOSE: 107 mg/dL — AB (ref 65–99)
POTASSIUM: 3.7 mmol/L (ref 3.5–5.1)
Sodium: 139 mmol/L (ref 135–145)

## 2016-10-11 NOTE — Progress Notes (Signed)
[facility] -  at [facility]   PATIENT NAME: [name]    MR#:  [id number]  DATE OF BIRTH:  1951/01/10  SUBJECTIVE:  CHIEF COMPLAINT:   Chief Complaint  Patient presents with  . Weakness   - s/p angiogram, renal function stable - doing well. Rash on trunk- zosyn stopped  REVIEW OF SYSTEMS:  Review of Systems  Constitutional: Negative for chills, fever and malaise/fatigue.  HENT: Negative for congestion, ear discharge, hearing loss and nosebleeds.   Eyes: Negative for blurred vision and double vision.  Respiratory: Negative for cough, shortness of breath and wheezing.   Cardiovascular: Positive for leg swelling. Negative for chest pain and palpitations.  Gastrointestinal: Negative for abdominal pain, constipation, diarrhea, nausea and vomiting.  Genitourinary: Negative for dysuria and urgency.  Musculoskeletal: Positive for myalgias.  Skin: Positive for rash.  Neurological: Negative for dizziness, speech change, focal weakness, seizures and headaches.  Psychiatric/Behavioral: Negative for depression.    DRUG ALLERGIES:   Allergies  Allergen Reactions  . Zosyn [Piperacillin Sod-Tazobactam So] Rash    VITALS:  Blood pressure (!) 109/53, pulse 91, temperature 98.2 F (36.8 C), temperature source Oral, resp. rate 16, height 5\' 11"  (1.803 m), weight 120.4 kg (265 lb 6.4 oz), SpO2 96 %.  PHYSICAL EXAMINATION:  Physical Exam  GENERAL:  66 y.o.-year-old patient lying in the bed with no acute distress.  EYES: Pupils equal, round, reactive to light and accommodation. No scleral icterus. Extraocular muscles intact.  HEENT: Head atraumatic, normocephalic. Oropharynx and nasopharynx clear.  NECK:  Supple, no jugular venous distention. No thyroid enlargement, no tenderness.  LUNGS: Normal breath sounds bilaterally, no wheezing, rales,rhonchi or crepitation. No use of accessory muscles of respiration. Decreased bibasilar breath  sounds CARDIOVASCULAR: S1, S2 normal. No murmurs, rubs, or gallops.  ABDOMEN: Soft, nontender, nondistended. Bowel sounds present. No organomegaly or mass.  EXTREMITIES: No cyanosis, or clubbing. 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 Muscle strength 5/5 in all extremities. Sensation intact. Gait not checked.  PSYCHIATRIC: The patient is alert and oriented x 3.  SKIN: macular confluent rash noted on the trunk, back, upper thighs    LABORATORY PANEL:   CBC  Recent Labs Lab 10/05/16 0549  WBC 11.8*  HGB 10.1*  HCT 30.4*  PLT 351   ------------------------------------------------------------------------------------------------------------------  Chemistries   Recent Labs Lab 10/06/16 0528  10/11/16 0508  NA 139  < > 139  K 3.7  < > 3.7  CL 106  < > 110  CO2 27  < > 24  GLUCOSE 264*  < > 107*  BUN 49*  < > 15  CREATININE 2.10*  < > 1.19  CALCIUM 7.9*  < > 7.9*  MG 1.6*  --   --   < > = values in this interval not displayed. ------------------------------------------------------------------------------------------------------------------  Cardiac Enzymes No results for input(s): TROPONINI in the last 168 hours. ------------------------------------------------------------------------------------------------------------------  RADIOLOGY:  No results found.  EKG:   Orders placed or performed during the hospital encounter of 10/03/16  . ED EKG 12-Lead  . ED EKG 12-Lead  . EKG 12-Lead  . EKG 12-Lead    ASSESSMENT AND PLAN:   66 year old male with past medical history significant for hypertension, type 2 diabetes mellitus on insulin presents to hospital secondary to right fifth toe gangrene and cellulitis. Also noted to have acute renal failure on admission.  #1 right fifth toe cellulitis and  gangrene-appreciate podiatry consult. -Status post amputation of right fifth toe and wound VAC  in place. -Appreciate ID consult. Cultures are negative.  -Received Zosyn in the hospital. Due to rash, it has been discontinued. Currently on ciprofloxacin and clindamycin -Continue wound VAC, dressing changes as recommended by podiatry. 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 -ATN from sepsis and acute BC powder usage. -Lisinopril and hydrochlorothiazide were held. Creatinine at baseline at this time  #3 peripheral vascular disease-history of smoking.  -Status post angiogram of bilateral lower extremities with angioplasty done to right dorsalis pedis artery  #4 septic shock on presentation secondary to cellulitis and diabetic foot infection of right foot. -Resolved at this time. - was on IV zosyn- discontinued due to rash. Appreciate ID input - on cipro and clindamycin  #5 diabetes mellitus-continue Lantus and also NovoLog prior to meals along with sliding scale insulin.  #6 DVT prophylaxis-on subcutaneous heparin  Physical therapy consult. -Might need rehabilitation   All the records are reviewed and case discussed with Care Management/Social Workerr. Management plans discussed with the patient, family and they are in agreement.  CODE STATUS: Full Code  TOTAL TIME TAKING CARE OF THIS PATIENT: 34 minutes.   POSSIBLE D/C IN 1-2 DAYS, DEPENDING ON CLINICAL CONDITION.   Chalise Pe M.D on 10/11/2016 at 11:44 AM  Between 7am to 6pm - Pager - [phone]  After 6pm go to [email or web address] - Social research officer, governmentpassword EPAS [facility]  [facility]  Office  [phone]  CC: Primary care physician; Abram SanderElena M Adamo, MD

## 2016-10-11 NOTE — Plan of Care (Signed)
Problem: Activity: Goal: Ability to perform//tolerate increased activity and mobilize with assistive devices will improve Outcome: Not Progressing Patient has not increased activity, encourage exercise in bed and sitting up on the side of the bed.

## 2016-10-11 NOTE — Progress Notes (Signed)
[facility]  ROUNDING NOTE   Subjective:   UOP 1100 Angiogram yesterday Creatinine 1.19 (1.3) NS at 79mL/hr  On cipro and clinda  Objective:  Vital signs in last 24 hours:  Temp:  [97.8 F (36.6 C)-98.2 F (36.8 C)] 98.2 F (36.8 C) (02/10 0456) Pulse Rate:  [67-88] 88 (02/10 0456) Resp:  [14-20] 14 (02/10 0456) BP: (106-124)/(47-76) 124/62 (02/10 0456) SpO2:  [93 %-99 %] 96 % (02/10 0456) Weight:  [120.4 kg (265 lb 6.4 oz)] 120.4 kg (265 lb 6.4 oz) (02/10 0456)  Weight change: 0 kg (0 lb) Filed Weights   10/09/16 0424 10/10/16 0422 10/11/16 0456  Weight: 120.3 kg (265 lb 3.2 oz) 120.4 kg (265 lb 6.4 oz) 120.4 kg (265 lb 6.4 oz)    Intake/Output: I/O last 3 completed shifts: In: 1744.2 [P.O.:480; I.V.:1164.2; IV Piggyback:100] Out: 2150 [Urine:2100; Drains:50]   Intake/Output this shift:  Total I/O In: 240 [P.O.:240] Out: 180 [Urine:180]  Physical Exam: General: No acute distress  Head: Normocephalic, atraumatic. Moist oral mucosal membranes  Eyes: Anicteric  Neck: Supple, trachea midline  Lungs:  Clear to auscultation, normal effort  Heart: S1S2 no rubs  Abdomen:  Soft, nontender, bowel sounds present  Extremities: 1+ RLE edema, wound vac in place right foot.  Neurologic: Nonfocal, moving all four extremities  Skin: No rashes       Basic Metabolic Panel:  Recent Labs Lab 10/06/16 0528 10/07/16 0314 10/08/16 0632 10/10/16 0425 10/11/16 0508  NA 139 140 137 139 139  K 3.7 3.6 3.6 3.7 3.7  CL 106 102 104 109 110  CO2 27 31 26 24 24   GLUCOSE 264* 239* 174* 148* 107*  BUN 49* 30* 20 17 15   CREATININE 2.10* 1.62* 1.34* 1.33* 1.19  CALCIUM 7.9* 8.1* 7.7* 7.8* 7.9*  MG 1.6*  --   --   --   --   PHOS  --  2.0*  --   --   --     Liver Function Tests:  Recent Labs Lab 10/07/16 0314  ALBUMIN 2.2*   No results for input(s): LIPASE, AMYLASE in the last 168 hours. No results for input(s): AMMONIA in the last 168  hours.  CBC:  Recent Labs Lab 10/05/16 0549  WBC 11.8*  HGB 10.1*  HCT 30.4*  MCV 87.2  PLT 351    Cardiac Enzymes: No results for input(s): CKTOTAL, CKMB, CKMBINDEX, TROPONINI in the last 168 hours.  BNP: Invalid input(s): POCBNP  CBG:  Recent Labs Lab 10/10/16 0748 10/10/16 1408 10/10/16 1729 10/10/16 2038 10/11/16 0721  GLUCAP 144* 139* 143* 125* 106*    Microbiology: Results for orders placed or performed during the hospital encounter of 10/03/16  Blood Culture (routine x 2)     Status: None   Collection Time: 10/03/16  1:17 PM  Result Value Ref Range Status   Specimen Description BLOOD RIGHT FOREARM  Final   Special Requests BOTTLES DRAWN AEROBIC AND ANAEROBIC AER11ML ANA9ML  Final   Culture NO GROWTH 5 DAYS  Final   Report Status 10/08/2016 FINAL  Final  Blood Culture (routine x 2)     Status: None   Collection Time: 10/03/16  1:18 PM  Result Value Ref Range Status   Specimen Description BLOOD LEFT FOREARM  Final   Special Requests BOTTLES DRAWN AEROBIC AND ANAEROBIC AER5ML ANA4ML  Final   Culture NO GROWTH 5 DAYS  Final   Report Status 10/08/2016 FINAL  Final  Urine culture  Status: None   Collection Time: 10/03/16  1:42 PM  Result Value Ref Range Status   Specimen Description URINE, RANDOM  Final   Special Requests NONE  Final   Culture   Final    NO GROWTH Performed at [facility], [address] [place], [place] [address]    Report Status 10/04/2016 FINAL  Final  Aerobic/Anaerobic Culture (surgical/deep wound)     Status: None   Collection Time: 10/03/16  5:38 PM  Result Value Ref Range Status   Specimen Description FOOT right foot abscess  Final   Special Requests NONE  Final   Gram Stain   Final    FEW WBC PRESENT, PREDOMINANTLY PMN ABUNDANT GRAM POSITIVE COCCI IN CLUSTERS ABUNDANT GRAM NEGATIVE RODS ABUNDANT GRAM POSITIVE COCCI IN PAIRS ABUNDANT GRAM POSITIVE RODS Performed at [facility], [address]  [place], [place] [address]    Culture   Final    NORMAL SKIN FLORA GROWING AEROBICALLY MIXED ANAEROBIC FLORA PRESENT.  CALL LAB IF FURTHER IID REQUIRED.    Report Status 10/07/2016 FINAL  Final  MRSA PCR Screening     Status: None   Collection Time: 10/03/16  8:52 PM  Result Value Ref Range Status   MRSA by PCR NEGATIVE NEGATIVE Final    Comment:        The GeneXpert MRSA Assay (FDA approved for NASAL specimens only), is one component of a comprehensive MRSA colonization surveillance program. It is not intended to diagnose MRSA infection nor to guide or monitor treatment for MRSA infections.     Coagulation Studies: No results for input(s): LABPROT, INR in the last 72 hours.  Urinalysis: No results for input(s): COLORURINE, LABSPEC, PHURINE, GLUCOSEU, HGBUR, BILIRUBINUR, KETONESUR, PROTEINUR, UROBILINOGEN, NITRITE, LEUKOCYTESUR in the last 72 hours.  Invalid input(s): APPERANCEUR    Imaging: No results found.   Medications:   . sodium chloride 50 mL/hr at 10/11/16 0435   . aspirin EC  81 mg Oral Daily  . atorvastatin  20 mg Oral Daily  . ciprofloxacin  500 mg Oral BID  . clindamycin  300 mg Oral Q8H  . docusate sodium  100 mg Oral BID  . heparin  5,000 Units Subcutaneous Q8H  . Influenza vac split quadrivalent PF  0.5 mL Intramuscular Tomorrow-1000  . insulin aspart  0-5 Units Subcutaneous QHS  . insulin aspart  0-9 Units Subcutaneous TID WC  . insulin aspart  8 Units Subcutaneous TID WC  . insulin glargine  15 Units Subcutaneous QHS  . living well with diabetes book   Does not apply Once  . pneumococcal 23 valent vaccine  0.5 mL Intramuscular Tomorrow-1000  . polyethylene glycol  17 g Oral Daily   acetaminophen **OR** acetaminophen, bisacodyl, lactulose, ondansetron **OR** ondansetron (ZOFRAN) IV, oxyCODONE, traZODone  Assessment/ Plan:  66 y.o. male with [name], hypertension, [name],  [name], who was [facility] on 10/[facility]/2016 for evaluation of right foot infection.   1.  Acute renal failure with metabolic acidosis: on chronic kidney disease stage III. Unknown baseline. Seems to be around 1.3.  Acute renal failure secondary to sepsis, hypotension and acute BC powder use.  Chronic kidney disease secondary to hypertension, diabetes, vascular disease and NSAID induced nephropathy Urinalysis with proteinuria, glucosuria on admission - Hold ACE-I. lisinopril-hydrochrlorothiazide.   2. Hypertension: blood pressure at goal. Not currently on any agents.  3. Diabetic foot infection: with peripheral vascular disease. Dr. 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 Dr. [name] did angiogram on 2/9 - ciprofloxacin and clindamycin.  - Appreciate vascular, ID and podiatry input. - Continue glucose control   LOS: 8 Bracha Frankowski 2/10/201810:50 AM

## 2016-10-11 NOTE — Progress Notes (Signed)
Patient Demographics  [name], is a 66 y.o. male   MRN: [id number]   DOB - 1950-09-02  Admit Date - 10/03/2016    Outpatient Primary MD for the patient is Jerry SanderElena M Adamo, MD  Consult requested in the Hospital by Jerry Baasadhika Kalisetti, MD, On 10/11/2016     With History of -  Past Medical History:  Diagnosis Date  . Diabetes mellitus without complication (HCC)   . Hypertension       Past Surgical History:  Procedure Laterality Date  . IRRIGATION AND DEBRIDEMENT FOOT Right 10/03/2016   Procedure: IRRIGATION AND DEBRIDEMENT FOOT;  Surgeon: [name], DPM;  Location: [facility] ORS;  Service: Podiatry;  Laterality: Right;    in for   Chief Complaint  Patient presents with  . Weakness     HPI  Jerry PhenesWilbert Horn  is a 66 y.o. male, Hospitalized about 10 days ago with a severe right foot infection. Dr. Ether GriffinsFowler did surgery to resect the fifth ray and excise or remove infected soft tissue muscle and bone. He underwent an angioplasty yesterday with some success. Angioplasty had to wait until yesterday because of poor renal function. Wound VAC is in place and is being changed every other day.    Social History Social History  Substance Use Topics  . Smoking status: Former Games developermoker  . Smokeless tobacco: Never Used  . Alcohol use No     Family History Family History  Problem Relation Age of Onset  . Family history unknown: Yes    Prior to Admission medications   Medication Sig Start Date End Date Taking? Authorizing Provider  aspirin EC 81 MG tablet Take 81 mg by mouth daily.   Yes Historical Provider, MD  atorvastatin (LIPITOR) 20 MG tablet Take 20 mg by mouth daily.   Yes Historical Provider, MD  clindamycin (CLEOCIN) 300 MG capsule Take 300 mg by mouth 4 (four) times daily.   Yes Historical Provider,  MD  insulin NPH-regular Human (NOVOLIN 70/30) (70-30) 100 UNIT/ML injection Inject 25 Units into the skin daily with breakfast.   Yes Historical Provider, MD  insulin NPH-regular Human (NOVOLIN 70/30) (70-30) 100 UNIT/ML injection Inject 26 Units into the skin daily with supper.   Yes Historical Provider, MD  lisinopril-hydrochlorothiazide (PRINZIDE,ZESTORETIC) 20-25 MG tablet Take 1 tablet by mouth daily.   Yes Historical Provider, MD  metFORMIN (GLUCOPHAGE) 1000 MG tablet Take 1,000 mg by mouth 2 (two) times daily with a meal.   Yes Historical Provider, MD    Anti-infectives    Start     Dose/Rate Route Frequency Ordered Stop   10/10/16 2000  ciprofloxacin (CIPRO) tablet 500 mg     500 mg Oral 2 times daily 10/10/16 1549     10/10/16 1600  clindamycin (CLEOCIN) capsule 300 mg     300 mg Oral Every 8 hours 10/10/16 1549     10/10/16 0400  ceFAZolin (ANCEF) IVPB 1 g/50 mL premix    Comments:  Send with pt to OR   1 g 100 mL/hr over 30 Minutes Intravenous On call 10/09/16 1636 10/10/16 1305   10/09/16 0600  vancomycin (VANCOCIN) 1,500 mg in sodium chloride 0.9 % 500 mL IVPB  Status:  Discontinued     1,500 mg 250 mL/hr over 120 Minutes Intravenous Every 18 hours 10/09/16 0432 10/09/16 1524   10/08/16 0400  vancomycin (VANCOCIN) 1,500 mg in sodium chloride 0.9 % 500 mL IVPB  Status:  Discontinued     1,500 mg 250 mL/hr over 120 Minutes Intravenous Every 24 hours 10/07/16 0452 10/09/16 0432   10/05/16 1800  piperacillin-tazobactam (ZOSYN) IVPB 3.375 g  Status:  Discontinued     3.375 g 12.5 mL/hr over 240 Minutes Intravenous Every 8 hours 10/05/16 1134 10/10/16 1547   10/05/16 1530  vancomycin (VANCOCIN) 1,500 mg in sodium chloride 0.9 % 500 mL IVPB  Status:  Discontinued     1,500 mg 250 mL/hr over 120 Minutes Intravenous Every 36 hours 10/05/16 1455 10/07/16 0452   10/05/16 1500  vancomycin (VANCOCIN) 1,500 mg in sodium chloride 0.9 % 500 mL IVPB  Status:  Discontinued     1,500 mg 250  mL/hr over 120 Minutes Intravenous Every 48 hours 10/03/16 1502 10/05/16 1455   10/03/16 2200  piperacillin-tazobactam (ZOSYN) IVPB 3.375 g  Status:  Discontinued     3.375 g 12.5 mL/hr over 240 Minutes Intravenous Every 12 hours 10/03/16 1502 10/05/16 1134   10/03/16 1500  vancomycin (VANCOCIN) IVPB 1000 mg/200 mL premix     1,000 mg 200 mL/hr over 60 Minutes Intravenous  Once 10/03/16 1447 10/03/16 1622   10/03/16 1315  piperacillin-tazobactam (ZOSYN) IVPB 3.375 g     3.375 g 100 mL/hr over 30 Minutes Intravenous  Once 10/03/16 1310 10/03/16 1418   10/03/16 1315  vancomycin (VANCOCIN) IVPB 1000 mg/200 mL premix     1,000 mg 200 mL/hr over 60 Minutes Intravenous  Once 10/03/16 1310 10/03/16 1448      Scheduled Meds: . aspirin EC  81 mg Oral Daily  . atorvastatin  20 mg Oral Daily  . ciprofloxacin  500 mg Oral BID  . clindamycin  300 mg Oral Q8H  . docusate sodium  100 mg Oral BID  . heparin  5,000 Units Subcutaneous Q8H  . Influenza vac split quadrivalent PF  0.5 mL Intramuscular Tomorrow-1000  . insulin aspart  0-5 Units Subcutaneous QHS  . insulin aspart  0-9 Units Subcutaneous TID WC  . insulin aspart  8 Units Subcutaneous TID WC  . insulin glargine  15 Units Subcutaneous QHS  . living well with diabetes book   Does not apply Once  . pneumococcal 23 valent vaccine  0.5 mL Intramuscular Tomorrow-1000  . polyethylene glycol  17 g Oral Daily   Continuous Infusions: . sodium chloride 50 mL/hr at 10/11/16 0435   PRN Meds:.acetaminophen **OR** acetaminophen, bisacodyl, lactulose, ondansetron **OR** ondansetron (ZOFRAN) IV, oxyCODONE, traZODone  Allergies  Allergen Reactions  . Zosyn [Piperacillin Sod-Tazobactam So] Rash    Physical Exam  Vitals  Blood pressure 124/62, pulse 88, temperature 98.2 F (36.8 C), temperature source Oral, resp. rate 14, height 5\' 11"  (1.803 m), weight 120.4 kg (265 lb 6.4 oz), SpO2 96 %.  Lower Extremity exam: Wound VAC is intact and  functional. Swelling is little more pronounced in the right foot and leg but no overt cellulitis or progressive infection was noted this timeframe.  Data Review  CBC  Recent Labs Lab 10/05/16 0549  WBC 11.8*  HGB 10.1*  HCT 30.4*  PLT 351  MCV 87.2  MCH 29.0  MCHC 33.2  RDW 15.1*   ------------------------------------------------------------------------------------------------------------------  Chemistries   Recent Labs Lab 10/06/16 0528 10/07/16 0314 10/08/16 1610 10/10/16 0425  10/11/16 0508  NA 139 140 137 139 139  K 3.7 3.6 3.6 3.7 3.7  CL 106 102 104 109 110  CO2 27 31 26 24 24   GLUCOSE 264* 239* 174* 148* 107*  BUN 49* 30* 20 17 15   CREATININE 2.10* 1.62* 1.34* 1.33* 1.19  CALCIUM 7.9* 8.1* 7.7* 7.8* 7.9*  MG 1.6*  --   --   --   --    ----------  Assessment & Plan: Patient appears to be stable and progressing fairly well since the angioplasty. He will likely go to rehabilitation at some point. Dr. Ether Griffins we will evaluate next week as far as other procedures that may be necessary or if they continued wound VAC to allow secondary healing is the appropriate progress for him. He currently appears to be stable.  Active Problems:   Septic shock (HCC)   Pressure injury of skin  Family Communication: Plan discussed with patient  . Epimenio Sarin M.D on 10/11/2016 at 10:15 AM

## 2016-10-11 NOTE — Progress Notes (Signed)
Physical Therapy Treatment Patient Details Name: [name] MRN: [id number] DOB: 26-Jul-1951 Today's Date: 10/11/2016    History of Present Illness Pt is pleasant 66 yo male, admitted to hospital w/ septic shock and acute renal failure; He was found to have wound on R foot requiring I&D and amputation of the R 5th toe and forefoot. Pt has a history of HTN, type II diabetes    PT Comments    Pt agreeable to PT. Pt participates in seated exercises at edge of bed. Cues for improved technique and speed with all exercises. Pt requires increased assist for sit to supine versus supine to sit. Max A x 2 to reposition in bed to use bed pan. Pt refuses attempts to sit in chair. Pt NWB on R and according to nursing, required hoyer for out of bed. Pt wishes to remain sitting edge of bed. Encouraged exercises to be performed 3-4 times a day. Continue PT to progress strength and all functional mobility.   Follow Up Recommendations  SNF     Equipment Recommendations  Wheelchair (measurements PT);Rolling walker with 5" wheels    Recommendations for Other Services       Precautions / Restrictions Precautions Precautions: Fall Restrictions Weight Bearing Restrictions: Yes RLE Weight Bearing: Non weight bearing    Mobility  Bed Mobility Overal bed mobility: Needs Assistance Bed Mobility: Sit to Supine;Supine to Sit       Sit to supine: Mod assist;+2 for physical assistance   General bed mobility comments: Requires instruction for all movement and assist. Max A of 2 to reposition upward in bed and Min A for rolling on/off bed pan. Mod I with increased time supine to sit and VCs   Transfers                 General transfer comment: Not tested; pt does not wish in chair; According to nursing hoyer was required  Ambulation/Gait             General Gait Details: Unable   Stairs            Wheelchair Mobility    Modified Rankin (Stroke Patients Only)        Balance                                    Cognition Arousal/Alertness: Awake/alert Behavior During Therapy: WFL for tasks assessed/performed Overall Cognitive Status: Within Functional Limits for tasks assessed                      Exercises General Exercises - Lower Extremity Ankle Circles/Pumps: AROM;Both;20 reps;Seated Long Arc Quad: AROM;Strengthening;Both;20 reps;Seated Hip ABduction/ADduction: AROM;Strengthening;Both;20 reps;Seated (straight leg; ) Hip Flexion/Marching: AAROM;Strengthening;Both;20 reps;Seated Other Exercises Other Exercises: add'r squeeze 20x seated    General Comments        Pertinent Vitals/Pain Pain Assessment: 0-10 Pain Score: 5  Pain Location: R distal LE/foot Pain Intervention(s): Monitored during session    Home Living                      Prior Function            PT Goals (current goals can now be found in the care plan section) Progress towards PT goals: Progressing toward goals (slowly)    Frequency    7X/week      PT Plan Current plan remains appropriate  Co-evaluation             End of Session   Activity Tolerance: Patient limited by fatigue;Patient limited by pain Patient left: in bed;Other (comment) (sitting edge of bed; nsg aware)     Time: [id number] PT Time Calculation (min) (ACUTE ONLY): 36 min  Charges:  $Therapeutic Exercise: 8-22 mins $Therapeutic Activity: 8-22 mins                    G Codes:      Scot DockHeidi E Barnes, PTA 10/11/2016, 10:07 AM

## 2016-10-12 LAB — BASIC METABOLIC PANEL
Anion gap: 6 (ref 5–15)
BUN: 13 mg/dL (ref 6–20)
CALCIUM: 7.6 mg/dL — AB (ref 8.9–10.3)
CO2: 21 mmol/L — ABNORMAL LOW (ref 22–32)
CREATININE: 1.2 mg/dL (ref 0.61–1.24)
Chloride: 110 mmol/L (ref 101–111)
GFR calc non Af Amer: >60 mL/min (ref >60)
Glucose, Bld: 135 mg/dL — ABNORMAL HIGH (ref 65–99)
Potassium: 3.6 mmol/L (ref 3.5–5.1)
SODIUM: 137 mmol/L (ref 135–145)

## 2016-10-12 LAB — GLUCOSE, CAPILLARY
Glucose-Capillary: 134 mg/dL — ABNORMAL HIGH (ref 65–99)
Glucose-Capillary: 133 mg/dL — ABNORMAL HIGH (ref 65–99)
Glucose-Capillary: 114 mg/dL — ABNORMAL HIGH (ref 65–99)
Glucose-Capillary: 175 mg/dL — ABNORMAL HIGH (ref 65–99)

## 2016-10-12 NOTE — Progress Notes (Signed)
Patient Demographics  [name], is a 66 y.o. male   MRN: [id number]   DOB - 13-Jan-1951  Admit Date - 10/03/2016    Outpatient Primary MD for the patient is Jerry SanderElena M Adamo, MD   Past Medical History:  Diagnosis Date  . Diabetes mellitus without complication (HCC)   . Hypertension       Past Surgical History:  Procedure Laterality Date  . IRRIGATION AND DEBRIDEMENT FOOT Right 10/03/2016   Procedure: IRRIGATION AND DEBRIDEMENT FOOT;  Surgeon: [name], DPM;  Location: [facility] ORS;  Service: Podiatry;  Laterality: Right;    in for   Chief Complaint  Patient presents with  . Weakness     HPI  Jerry PhenesWilbert Horn  is a 66 y.o. 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    Review of Systems    In addition to the HPI above,  No Fever-chills, No Headache, No changes with Vision or hearing, No problems swallowing food or Liquids, No Chest pain, Cough or Shortness of Breath, No Abdominal pain, No Nausea or Vommitting, Bowel movements are regular, No Blood in stool or Urine, No dysuria, No new skin rashes or bruises, No new joints pains-aches,  No new weakness, tingling, numbness in any extremity, No recent weight gain or loss, No polyuria, polydypsia or polyphagia, No significant Mental Stressors.  A full 10 point Review of Systems was done, except as stated above, all other Review of Systems were negative.   Social History Social History  Substance Use Topics  . Smoking status: Former Games developermoker  . Smokeless tobacco: Never Used  . Alcohol use No     Anti-infectives    Start     Dose/Rate Route Frequency Ordered Stop   10/10/16 2000  ciprofloxacin  (CIPRO) tablet 500 mg     500 mg Oral 2 times daily 10/10/16 1549     10/10/16 1600  clindamycin (CLEOCIN) capsule 300 mg     300 mg Oral Every 8 hours 10/10/16 1549     10/10/16 0400  ceFAZolin (ANCEF) IVPB 1 g/50 mL premix    Comments:  Send with pt to OR   1 g 100 mL/hr over 30 Minutes Intravenous On call 10/09/16 1636 10/10/16 1305   10/09/16 0600  vancomycin (VANCOCIN) 1,500 mg in sodium chloride 0.9 % 500 mL IVPB  Status:  Discontinued     1,500 mg 250 mL/hr over 120 Minutes Intravenous Every 18 hours 10/09/16 0432 10/09/16 1524   10/08/16 0400  vancomycin (VANCOCIN) 1,500 mg in sodium chloride 0.9 % 500 mL IVPB  Status:  Discontinued     1,500 mg 250 mL/hr over 120 Minutes Intravenous Every 24 hours 10/07/16 0452 10/09/16 0432   10/05/16 1800  piperacillin-tazobactam (ZOSYN) IVPB 3.375 g  Status:  Discontinued     3.375 g 12.5 mL/hr over 240 Minutes Intravenous Every 8 hours 10/05/16 1134 10/10/16 1547   10/05/16 1530  vancomycin (VANCOCIN) 1,500 mg in sodium chloride 0.9 % 500 mL IVPB  Status:  Discontinued     1,500 mg 250 mL/hr  over 120 Minutes Intravenous Every 36 hours 10/05/16 1455 10/07/16 0452   10/05/16 1500  vancomycin (VANCOCIN) 1,500 mg in sodium chloride 0.9 % 500 mL IVPB  Status:  Discontinued     1,500 mg 250 mL/hr over 120 Minutes Intravenous Every 48 hours 10/03/16 1502 10/05/16 1455   10/03/16 2200  piperacillin-tazobactam (ZOSYN) IVPB 3.375 g  Status:  Discontinued     3.375 g 12.5 mL/hr over 240 Minutes Intravenous Every 12 hours 10/03/16 1502 10/05/16 1134   10/03/16 1500  vancomycin (VANCOCIN) IVPB 1000 mg/200 mL premix     1,000 mg 200 mL/hr over 60 Minutes Intravenous  Once 10/03/16 1447 10/03/16 1622   10/03/16 1315  piperacillin-tazobactam (ZOSYN) IVPB 3.375 g     3.375 g 100 mL/hr over 30 Minutes Intravenous  Once 10/03/16 1310 10/03/16 1418   10/03/16 1315  vancomycin (VANCOCIN) IVPB 1000 mg/200 mL premix     1,000 mg 200 mL/hr over 60 Minutes  Intravenous  Once 10/03/16 1310 10/03/16 1448      Scheduled Meds: . aspirin EC  81 mg Oral Daily  . atorvastatin  20 mg Oral Daily  . ciprofloxacin  500 mg Oral BID  . clindamycin  300 mg Oral Q8H  . docusate sodium  100 mg Oral BID  . heparin  5,000 Units Subcutaneous Q8H  . Influenza vac split quadrivalent PF  0.5 mL Intramuscular Tomorrow-1000  . insulin aspart  0-5 Units Subcutaneous QHS  . insulin aspart  0-9 Units Subcutaneous TID WC  . insulin aspart  8 Units Subcutaneous TID WC  . insulin glargine  15 Units Subcutaneous QHS  . living well with diabetes book   Does not apply Once  . pneumococcal 23 valent vaccine  0.5 mL Intramuscular Tomorrow-1000  . polyethylene glycol  17 g Oral Daily   Continuous Infusions: PRN Meds:.acetaminophen **OR** acetaminophen, bisacodyl, lactulose, ondansetron **OR** ondansetron (ZOFRAN) IV, oxyCODONE, traZODone  Allergies  Allergen Reactions  . Zosyn [Piperacillin Sod-Tazobactam So] Rash    Physical Exam  Vitals  Blood pressure (!) 125/56, pulse 94, temperature 98.9 F (37.2 C), temperature source Oral, resp. rate 16, height 5\' 11"  (1.803 m), weight 118.6 kg (261 lb 8 oz), SpO2 95 %.  Lower Extremity exam:Right foot appears to be stable. There is no cellulitis or redness around the wound site or emanating from the wound VAC area. He appears to be stabilized and is currently on Cipro and clindamycin orally.   Assessment & Plan: Wound VAC is in place and stable patient seems to be doing much better with his overall condition foot is stable and wound VAC is functional. He is scheduled to be sent to rehabilitation tomorrow. Continue with the oral antibiotics as per Dr. Sampson Goon. Continue with nonweightbearing to the right foot patient can transfer from bed to chair or bed to wheelchair or bed to bedside toilet but no weightbearing on the right foot. Wound VAC needs to be changed proximally every 3 days. She'll have him scheduled to see Dr.  Ether Griffins in 2 weeks.   Active Problems:   Septic shock (HCC)   Pressure injury of skin  Family Communication: Plan discussed with patient    [name] M.D on 10/12/2016 at 10:43 AM

## 2016-10-12 NOTE — Progress Notes (Signed)
[facility] [place] [facility]   PATIENT NAME: [name]    MR#:  [id number]  DATE OF BIRTH:  01/24/1951  SUBJECTIVE:  CHIEF COMPLAINT:   Chief Complaint  Patient presents with  . Weakness   - rash is slowly fading, denies any complaints - PT recommended SNF  REVIEW OF SYSTEMS:  Review of Systems  Constitutional: Negative for chills, fever and malaise/fatigue.  HENT: Negative for congestion, ear discharge, hearing loss and nosebleeds.   Eyes: Negative for blurred vision and double vision.  Respiratory: Negative for cough, shortness of breath and wheezing.   Cardiovascular: Positive for leg swelling. Negative for chest pain and palpitations.  Gastrointestinal: Negative for abdominal pain, constipation, diarrhea, nausea and vomiting.  Genitourinary: Negative for dysuria and urgency.  Musculoskeletal: Positive for myalgias.  Skin: Positive for rash.  Neurological: Negative for dizziness, speech change, focal weakness, seizures and headaches.  Psychiatric/Behavioral: Negative for depression.    DRUG ALLERGIES:   Allergies  Allergen Reactions  . Zosyn [Piperacillin Sod-Tazobactam So] Rash    VITALS:  Blood pressure (!) 125/56, pulse 94, temperature 98.9 F (37.2 C), temperature source Oral, resp. rate 16, height 5\' 11"  (1.803 m), weight 118.6 kg (261 lb 8 oz), SpO2 95 %.  PHYSICAL EXAMINATION:  Physical Exam  GENERAL:  66 y.o.-year-old patient lying in the bed with no acute distress.  EYES: Pupils equal, round, reactive to light and accommodation. No scleral icterus. Extraocular muscles intact.  HEENT: Head atraumatic, normocephalic. Oropharynx and nasopharynx clear.  NECK:  Supple, no jugular venous distention. No thyroid enlargement, no tenderness.  LUNGS: Normal breath sounds bilaterally, no wheezing, rales,rhonchi or crepitation. No use of accessory muscles of respiration. Decreased bibasilar breath sounds CARDIOVASCULAR: S1, S2 normal.  No murmurs, rubs, or gallops.  ABDOMEN: Soft, nontender, nondistended. Bowel sounds present. No organomegaly or mass.  EXTREMITIES: No cyanosis, or clubbing. 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 Muscle strength 5/5 in all extremities. Sensation intact. Gait not checked.  PSYCHIATRIC: The patient is alert and oriented x 3.  SKIN: macular confluent rash noted on the trunk, back, upper thighs - fading slowly   LABORATORY PANEL:   CBC No results for input(s): WBC, HGB, HCT, PLT in the last 168 hours. ------------------------------------------------------------------------------------------------------------------  Chemistries   Recent Labs Lab 10/06/16 0528  10/12/16 0524  NA 139  < > 137  K 3.7  < > 3.6  CL 106  < > 110  CO2 27  < > 21*  GLUCOSE 264*  < > 135*  BUN 49*  < > 13  CREATININE 2.10*  < > 1.20  CALCIUM 7.9*  < > 7.6*  MG 1.6*  --   --   < > = values in this interval not displayed. ------------------------------------------------------------------------------------------------------------------  Cardiac Enzymes No results for input(s): TROPONINI in the last 168 hours. ------------------------------------------------------------------------------------------------------------------  RADIOLOGY:  No results found.  EKG:   Orders placed or performed during the hospital encounter of 10/03/16  . ED EKG 12-Lead  . ED EKG 12-Lead  . EKG 12-Lead  . EKG 12-Lead    ASSESSMENT AND PLAN:   66 year old male with past medical history significant for hypertension, type 2 diabetes mellitus on insulin presents to hospital secondary to right fifth toe gangrene and cellulitis. Also noted to have acute renal failure on admission.  #1 right fifth toe cellulitis and gangrene-appreciate podiatry consult. -Status post amputation of  right fifth toe and wound VAC in place. -Appreciate ID consult.  Cultures are negative.  -Received Zosyn in the hospital. Due to rash, it has been discontinued. Currently on ciprofloxacin and clindamycin -Continue wound VAC, dressing changes as recommended by podiatry. 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 -ATN from sepsis and acute BC powder usage. -Lisinopril and hydrochlorothiazide were held. Creatinine at baseline at this time - dc fluids today. Encourage oral intake  #3 peripheral vascular disease-history of smoking.  -Status post angiogram of bilateral lower extremities with angioplasty done to right dorsalis pedis artery  #4 septic shock on presentation secondary to cellulitis and diabetic foot infection of right foot. -Resolved at this time. - was on IV zosyn- discontinued due to rash. Appreciate ID input - on cipro and clindamycin  #5 diabetes mellitus-continue Lantus and also NovoLog prior to meals along with sliding scale insulin.  #6 DVT prophylaxis-on subcutaneous heparin  Physical therapy consult. -discharge to rehab tomorrow   All the records are reviewed and case discussed with Care Management/Social Workerr. Management plans discussed with the patient, family and they are in agreement.  CODE STATUS: Full Code  TOTAL TIME TAKING CARE OF THIS PATIENT: 32 minutes.   POSSIBLE D/C TOMORROW, DEPENDING ON CLINICAL CONDITION.   Enid Baas M.D on 10/12/2016 at 8:27 AM  Between 7am to 6pm - Pager - [phone]  After 6pm go to [email or web address] - Social research officer, government  [facility]  Office  [phone]  CC: Primary care physician; Abram Sander, MD

## 2016-10-12 NOTE — Clinical Social Work Note (Signed)
Clinical Social Work Assessment  Patient Details  Name: [name] MRN: [id number] Date of Birth: Jan 18, 1951  Date of referral:  10/12/16               Reason for consult:  Facility Placement                Permission sought to share information with:  Chartered certified accountant granted to share information::  Yes, Verbal Permission Granted  Name::        Agency::     Relationship::     Contact Information:     Housing/Transportation Living arrangements for the past 2 months:  Single Family Home Source of Information:  Patient Patient Interpreter Needed:  None Criminal Activity/Legal Involvement Pertinent to Current Situation/Hospitalization:  No - Comment as needed Significant Relationships:  Other Family Members, Siblings Lives with:  Siblings Do you feel safe going back to the place where you live?  Yes Need for family participation in patient care:  No (Coment)  Care giving concerns:  PT recommendation for STR   Social Worker assessment / plan:  The CSW met with patient at bedside to discuss dc planning. The patient gave verbal permission to conduct bed search and named [facility] as his preference. CSW explained referral process.  At baseline, the patient is independent with ADLs and IADLs including driving and medication management. The patient lives with his siblings in a single-family home. The patient is continent of both bladder and bowel, and he reports use of a cane PRN over the past month.   Employment status:  Retired Nurse, adult PT Recommendations:  [place] / Referral to community resources:  [place]  Patient/Family's Response to care:  Patient thanked CSW for assistance.  Patient/Family's Understanding of and Emotional Response to Diagnosis, Current Treatment, and Prognosis:  Patient is in agreement with the dc plan.  Emotional Assessment Appearance:  Appears  stated age Attitude/Demeanor/Rapport:   (Pleasant) Affect (typically observed):  Accepting, Appropriate, Pleasant Orientation:  Oriented to Self, Oriented to Place, Oriented to  Time, Oriented to Situation Alcohol / Substance use:  Never Used Psych involvement (Current and /or in the community):  No (Comment)  Discharge Needs  Concerns to be addressed:  Care Coordination Readmission within the last 30 days:  No Current discharge risk:  None Barriers to Discharge:  Continued Medical Work up   [employer], LCSW 10/12/2016, 2:23 PM

## 2016-10-12 NOTE — Progress Notes (Signed)
Physical Therapy Treatment Patient Details Name: [name] MRN: [id number] DOB: 1950-11-20 Today's Date: 10/12/2016    History of Present Illness Pt is pleasant 66 yo male, admitted to hospital w/ septic shock and acute renal failure; He was found to have wound on R foot requiring I&D and amputation of the R 5th toe and forefoot. Pt has a history of HTN, type II diabetes    PT Comments    Pt continues to be limited with mobility and transfers this date due to large body habitus, weakness, and R LE NWB'ing status.  Pt able to perform lateral scoot from bed to recliner with Min A for R LE management.  Pt motivated to participate in exercisers and mobility throughout session.  Will continue to progress pt toward PT goals.  Pt unable to stand or ambulate this date due to decreased safety for patient and PT.   Follow Up Recommendations  SNF     Equipment Recommendations  Wheelchair (measurements PT);Rolling walker with 5" wheels    Recommendations for Other Services       Precautions / Restrictions Precautions Precautions: Fall Restrictions RLE Weight Bearing: Non weight bearing    Mobility  Bed Mobility Overal bed mobility: Needs Assistance Bed Mobility: Sit to Supine;Supine to Sit           General bed mobility comments: Pt received sitting up EOB and/or reclining back on pillow in sitting position.  Transfers Overall transfer level: Needs assistance Equipment used: None Transfers: Lateral/Scoot Transfers          Lateral/Scoot Transfers: Min assist General transfer comment: Attempted to stand, but pt unable to clear buttocks from bed; positioned recliner with L arm rest lowered and pt able to lateral scoot onto recliner with Min A for R LE management and to maintain NWB'ing status.  Lift pad placed in recliner.  Ambulation/Gait   Ambulation Distance (Feet): 0 Feet         General Gait Details: Unable   Stairs            Wheelchair Mobility     Modified Rankin (Stroke Patients Only)       Balance Overall balance assessment: Needs assistance Sitting-balance support: Bilateral upper extremity supported Sitting balance-Leahy Scale: Good                              Cognition Arousal/Alertness: Awake/alert Behavior During Therapy: WFL for tasks assessed/performed Overall Cognitive Status: Within Functional Limits for tasks assessed                      Exercises General Exercises - Lower Extremity Ankle Circles/Pumps: AROM;Both;20 reps;Seated [place] Quad: AROM;Strengthening;Both;20 reps;Seated Hip ABduction/ADduction: AROM;Strengthening;Both;20 reps;Seated Hip Flexion/Marching: AAROM;Strengthening;Both;20 reps;Seated Toe Raises: AAROM;Strengthening;Both;10 reps;Left;Seated Heel Raises: AAROM;Left;10 reps;Seated    General Comments General comments (skin integrity, edema, etc.): R wound vac intact      Pertinent Vitals/Pain Pain Assessment: No/denies pain    Home Living                      Prior Function            PT Goals (current goals can now be found in the care plan section) Acute Rehab PT Goals Patient Stated Goal: To go to rehab and get better PT Goal Formulation: With patient Time For Goal Achievement: 10/23/16 Potential to Achieve Goals: Fair Progress towards PT goals: Progressing toward  goals    Frequency    7X/week      PT Plan Current plan remains appropriate    Co-evaluation             End of Session Equipment Utilized During Treatment: Gait belt Activity Tolerance: Patient limited by fatigue Patient left: in chair;with call bell/phone within reach;with chair alarm set     Time: 2130-8657 PT Time Calculation (min) (ACUTE ONLY): 26 min  Charges:  $Therapeutic Exercise: 8-22 mins $Therapeutic Activity: 8-22 mins                    G Codes:      Dinia Joynt A Undrea Shipes, PT Oct 14, 2016, 3:02 PM

## 2016-10-12 NOTE — Progress Notes (Signed)
[facility]  ROUNDING NOTE   Subjective:   UOP 2180 Creatinine 1.2  Patient without complaints  Objective:  Vital signs in last 24 hours:  Temp:  [97.5 F (36.4 C)-98.9 F (37.2 C)] 97.9 F (36.6 C) (02/11 1103) Pulse Rate:  [88-94] 94 (02/11 1103) Resp:  [16-20] 19 (02/11 1103) BP: (102-125)/(56-67) 102/67 (02/11 1103) SpO2:  [94 %-96 %] 96 % (02/11 1103) Weight:  [118.6 kg (261 lb 8 oz)] 118.6 kg (261 lb 8 oz) (02/11 0415)  Weight change: -1.769 kg (-3 lb 14.4 oz) Filed Weights   10/10/16 0422 10/11/16 0456 10/12/16 0415  Weight: 120.4 kg (265 lb 6.4 oz) 120.4 kg (265 lb 6.4 oz) 118.6 kg (261 lb 8 oz)    Intake/Output: I/O last 3 completed shifts: In: 1680 [P.O.:480; I.V.:1200] Out: 2580 [Urine:2580]   Intake/Output this shift:  Total I/O In: 240 [P.O.:240] Out: 910 [Urine:600; Drains:310]  Physical Exam: General: No acute distress  Head: Normocephalic, atraumatic. Moist oral mucosal membranes  Eyes: Anicteric  Neck: Supple, trachea midline  Lungs:  Clear to auscultation, normal effort  Heart: S1S2 no rubs  Abdomen:  Soft, nontender, bowel sounds present  Extremities: trace RLE edema, wound vac in place right foot.  Neurologic: Nonfocal, moving all four extremities  Skin: No rashes       Basic Metabolic Panel:  Recent Labs Lab 10/06/16 0528 10/07/16 0314 10/08/16 0632 10/10/16 0425 10/11/16 0508 10/12/16 0524  NA 139 140 137 139 139 137  K 3.7 3.6 3.6 3.7 3.7 3.6  CL 106 102 104 109 110 110  CO2 27 31 26 24 24  21*  GLUCOSE 264* 239* 174* 148* 107* 135*  BUN 49* 30* 20 17 15 13   CREATININE 2.10* 1.62* 1.34* 1.33* 1.19 1.20  CALCIUM 7.9* 8.1* 7.7* 7.8* 7.9* 7.6*  MG 1.6*  --   --   --   --   --   PHOS  --  2.0*  --   --   --   --     Liver Function Tests:  Recent Labs Lab 10/07/16 0314  ALBUMIN 2.2*   No results for input(s): LIPASE, AMYLASE in the last 168 hours. No results for input(s): AMMONIA in the last 168  hours.  CBC: No results for input(s): WBC, NEUTROABS, HGB, HCT, MCV, PLT in the last 168 hours.  Cardiac Enzymes: No results for input(s): CKTOTAL, CKMB, CKMBINDEX, TROPONINI in the last 168 hours.  BNP: Invalid input(s): POCBNP  CBG:  Recent Labs Lab 10/11/16 1118 10/11/16 1601 10/11/16 2055 10/12/16 0716 10/12/16 1103  GLUCAP 104* 74 145* 134* 133*    Microbiology: Results for orders placed or performed during the hospital encounter of 10/03/16  Blood Culture (routine x 2)     Status: None   Collection Time: 10/03/16  1:17 PM  Result Value Ref Range Status   Specimen Description BLOOD RIGHT FOREARM  Final   Special Requests BOTTLES DRAWN AEROBIC AND ANAEROBIC AER11ML ANA9ML  Final   Culture NO GROWTH 5 DAYS  Final   Report Status 10/08/2016 FINAL  Final  Blood Culture (routine x 2)     Status: None   Collection Time: 10/03/16  1:18 PM  Result Value Ref Range Status   Specimen Description BLOOD LEFT FOREARM  Final   Special Requests BOTTLES DRAWN AEROBIC AND ANAEROBIC AER5ML ANA4ML  Final   Culture NO GROWTH 5 DAYS  Final   Report Status 10/08/2016 FINAL  Final  Urine culture  Status: None   Collection Time: 10/03/16  1:42 PM  Result Value Ref Range Status   Specimen Description URINE, RANDOM  Final   Special Requests NONE  Final   Culture   Final    NO GROWTH Performed at [facility], [address] [place], [place] [address]    Report Status 10/04/2016 FINAL  Final  Aerobic/Anaerobic Culture (surgical/deep wound)     Status: None   Collection Time: 10/03/16  5:38 PM  Result Value Ref Range Status   Specimen Description FOOT right foot abscess  Final   Special Requests NONE  Final   Gram Stain   Final    FEW WBC PRESENT, PREDOMINANTLY PMN ABUNDANT GRAM POSITIVE COCCI IN CLUSTERS ABUNDANT GRAM NEGATIVE RODS ABUNDANT GRAM POSITIVE COCCI IN PAIRS ABUNDANT GRAM POSITIVE RODS Performed at [facility], [address] [place], [place]  [id number]    Culture   Final    NORMAL SKIN FLORA GROWING AEROBICALLY MIXED ANAEROBIC FLORA PRESENT.  CALL LAB IF FURTHER IID REQUIRED.    Report Status 10/07/2016 FINAL  Final  MRSA PCR Screening     Status: None   Collection Time: 10/03/16  8:52 PM  Result Value Ref Range Status   MRSA by PCR NEGATIVE NEGATIVE Final    Comment:        The GeneXpert MRSA Assay (FDA approved for NASAL specimens only), is one component of a comprehensive MRSA colonization surveillance program. It is not intended to diagnose MRSA infection nor to guide or monitor treatment for MRSA infections.     Coagulation Studies: No results for input(s): LABPROT, INR in the last 72 hours.  Urinalysis: No results for input(s): COLORURINE, LABSPEC, PHURINE, GLUCOSEU, HGBUR, BILIRUBINUR, KETONESUR, PROTEINUR, UROBILINOGEN, NITRITE, LEUKOCYTESUR in the last 72 hours.  Invalid input(s): APPERANCEUR    Imaging: No results found.   Medications:    . aspirin EC  81 mg Oral Daily  . atorvastatin  20 mg Oral Daily  . ciprofloxacin  500 mg Oral BID  . clindamycin  300 mg Oral Q8H  . docusate sodium  100 mg Oral BID  . heparin  5,000 Units Subcutaneous Q8H  . Influenza vac split quadrivalent PF  0.5 mL Intramuscular Tomorrow-1000  . insulin aspart  0-5 Units Subcutaneous QHS  . insulin aspart  0-9 Units Subcutaneous TID WC  . insulin aspart  8 Units Subcutaneous TID WC  . insulin glargine  15 Units Subcutaneous QHS  . living well with diabetes book   Does not apply Once  . pneumococcal 23 valent vaccine  0.5 mL Intramuscular Tomorrow-1000  . polyethylene glycol  17 g Oral Daily   acetaminophen **OR** acetaminophen, bisacodyl, lactulose, ondansetron **OR** ondansetron (ZOFRAN) IV, oxyCODONE, traZODone  Assessment/ Plan:  66 y.o. male with [name], hypertension, [name], [name], who was [facility]  10/[facility]/2016 for evaluation of right foot infection.   1.  Acute renal failure with metabolic acidosis: on chronic kidney disease stage III. Unknown baseline. Seems to be around 1.2-1.3. Admitted with creatinine of 7.  Angiogram on 2/9.  Acute renal failure secondary to sepsis, hypotension and acute BC powder use.  Chronic kidney disease secondary to hypertension, diabetes, vascular disease and NSAID induced nephropathy Urinalysis with proteinuria, glucosuria on admission - Hold ACE-I. lisinopril-hydrochrlorothiazide.  - Holding IV fluids  2. Hypertension: blood pressure at goal. Not  currently on any agents.   3. Diabetic foot infection: with peripheral vascular disease. Dr. 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 Dr. [name] did angiogram on 2/9 - ciprofloxacin and clindamycin.  - Appreciate vascular, ID and podiatry input. - Continue glucose control   LOS: 9 [name] 2/11/201811:42 AM

## 2016-10-13 ENCOUNTER — Encounter: Payer: Self-pay | Admitting: Vascular Surgery

## 2016-10-13 LAB — GLUCOSE, CAPILLARY
Glucose-Capillary: 135 mg/dL — ABNORMAL HIGH (ref 65–99)
Glucose-Capillary: 143 mg/dL — ABNORMAL HIGH (ref 65–99)

## 2016-10-13 MED ORDER — DOCUSATE SODIUM 100 MG PO CAPS
100.0000 mg | ORAL_CAPSULE | Freq: Two times a day (BID) | ORAL | 0 refills | Status: DC
Start: 2016-10-13 — End: 2023-02-24

## 2016-10-13 MED ORDER — CLINDAMYCIN HCL 300 MG PO CAPS
300.0000 mg | ORAL_CAPSULE | Freq: Three times a day (TID) | ORAL | 0 refills | Status: DC
Start: 2016-10-13 — End: 2016-10-13

## 2016-10-13 MED ORDER — CIPROFLOXACIN HCL 500 MG PO TABS: 500.0000 mg | ORAL_TABLET | Freq: Two times a day (BID) | ORAL | 0 refills | Status: DC

## 2016-10-13 MED ORDER — OXYCODONE HCL 5 MG PO TABS: 5.0000 mg | ORAL_TABLET | ORAL | 0 refills | Status: DC | PRN

## 2016-10-13 MED ORDER — INSULIN GLARGINE 100 UNIT/ML ~~LOC~~ SOLN: 15.0000 [IU] | Freq: Every day | SUBCUTANEOUS | 11 refills | Status: DC

## 2016-10-13 MED ORDER — CLINDAMYCIN HCL 300 MG PO CAPS: 300.0000 mg | ORAL_CAPSULE | Freq: Three times a day (TID) | ORAL | 0 refills | Status: DC

## 2016-10-13 MED ORDER — INSULIN ASPART 100 UNIT/ML ~~LOC~~ SOLN: 8.0000 [IU] | Freq: Three times a day (TID) | SUBCUTANEOUS | 11 refills | Status: DC

## 2016-10-13 NOTE — Care Management Important Message (Signed)
Important Message  Patient Details  Name: [name] MRN: [id number] Date of Birth: 24-Mar-1951   Medicare Important Message Given:  Yes  Initial signed IM printed from Epic and given to patient.    Eber HongGreene, Langley Flatley R, RN 10/13/2016, 9:15 AM

## 2016-10-13 NOTE — Progress Notes (Signed)
[facility] Ems here to transport. Wound vac dressing removed and wet to dry dressing done. Spoke with [facility] they plan to put a wound vac back on in the AM. IV removed and intact. Tele box removed and returned. DC via EMS and stretcher.

## 2016-10-13 NOTE — Consult Note (Signed)
WOC Nurse wound follow up Reason for Consult:NPWT VAC Dressing change per MD orders.  Now on Mon/Wed/Fri schedule...  Wound type:Surgical  Pressure Injury POA: N/A Measurement:14 cm x 7 cm x 5 cm with bone palpable.  Wound ZOX:WRUEAVWbed:Friable, ruddy red.  Drainage (amount, consistency, odor) Moderate bloody drainage.  Periwound:Macerated wound edges. Skin flap to right lateral foot is necrotic and nonviable. Protected with skin prep and hydrocolloid dressing. Wound bed filled with black foam. Bridged to dorsal foot.  Dressing procedure/placement/frequency:NPWT Mon/Wed/Fri next week.  WOC team will follow and remain available to patient, medical and nursing teams   Maple HudsonKaren Laqueta Bonaventura RN BSN [facility] Pager [phone]

## 2016-10-13 NOTE — Progress Notes (Signed)
Physical Therapy Treatment Patient Details Name: [name] MRN: [id number] DOB: 21-Aug-1951 Today's Date: 10/13/2016    History of Present Illness Pt is pleasant 66 yo male, admitted to hospital w/ septic shock and acute renal failure; He was found to have wound on R foot requiring I&D and amputation of the R 5th toe and forefoot. Pt has a history of HTN, type II diabetes    PT Comments    Pt agreeable to PT; denies pain. Pt progressing bed mobility and transfers bed to chair requiring less assist; however, bed to chair transfer effortful for pt and fatiguing. Pt participates well with seated and long sit exercises. Quad set bilaterally noted to be weak and pt has difficulty with terminal knee extension bilaterally with pain at end range. Encouraged exercises to be performed 3-5 times a day for improved strength/endurance. Pt has questions and educated on progression of exercise repetition, sets and frequency. Answered to his satisfaction. Continue PT to progress strength, endurance to improve all functional mobility.   Follow Up Recommendations  SNF     Equipment Recommendations  Wheelchair (measurements PT);Rolling walker with 5" wheels    Recommendations for Other Services       Precautions / Restrictions Precautions Precautions: Fall Restrictions Weight Bearing Restrictions: Yes RLE Weight Bearing: Non weight bearing    Mobility  Bed Mobility Overal bed mobility: Modified Independent Bed Mobility: Supine to Sit     Supine to sit: Modified independent (Device/Increase time)     General bed mobility comments: Increased time/ use of rail  Transfers Overall transfer level: Needs assistance Equipment used: None Transfers: Lateral/Scoot Transfers          Lateral/Scoot Transfers: Min assist General transfer comment: Cues for use of LLE to assist scoot transfer bed to chair to the right. Assist to prevent scooting to far foward. Effortful for pt with faitgue and  need to rest before scooting back in chair  Ambulation/Gait             General Gait Details: unable   Stairs            Wheelchair Mobility    Modified Rankin (Stroke Patients Only)       Balance                                    Cognition Arousal/Alertness: Awake/alert Behavior During Therapy: WFL for tasks assessed/performed Overall Cognitive Status: Within Functional Limits for tasks assessed                      Exercises General Exercises - Lower Extremity Ankle Circles/Pumps: AROM;Both;20 reps Quad Sets: Strengthening;Both;20 reps (poor QS; demonstrates more GS and ADD'r squeeze) Gluteal Sets: Strengthening;Both;20 reps Long Arc Quad: AROM;Strengthening;Both;20 reps;Seated Hip ABduction/ADduction: AROM;Both;10 reps (2 sets) Straight Leg Raises: Strengthening;Both;10 reps;Seated (2 sets) Other Exercises Other Exercises: chair push up 10x    General Comments        Pertinent Vitals/Pain Pain Assessment: No/denies pain    Home Living                      Prior Function            PT Goals (current goals can now be found in the care plan section) Progress towards PT goals: Progressing toward goals (slowly)    Frequency    7X/week      PT  Plan Current plan remains appropriate    Co-evaluation             End of Session Equipment Utilized During Treatment: Gait belt Activity Tolerance: Patient limited by fatigue Patient left: in chair;with call bell/phone within reach;with chair alarm set     Time: [id number] PT Time Calculation (min) (ACUTE ONLY): 31 min  Charges:  $Therapeutic Exercise: 8-22 mins $Therapeutic Activity: 8-22 mins                    G CodesScot Horn:      [name], PTA 10/13/2016, 1:27 PM

## 2016-10-13 NOTE — Clinical Social Work Note (Addendum)
CSW presented bed offers to patient and [facility].  [facility] and they said they can accept patient pending insurance approval.  CSW faxed clinical paperwork to patient's insurance company awaiting insurance authorization for patient.  1:30pm  CSW received authorization for patient to go to SNF, auth number is [id number].  [facility] number.  [facility] to [facility].  Patient and family agreeable to plans will transport via ems RN to call report.  Ervin KnackEric R. Harlee Pursifull, MSW, LCSWA [phone]  10/13/2016 11:30 AM

## 2016-10-13 NOTE — Discharge Instructions (Signed)
Sepsis, Adult Sepsis is a serious infection of your blood or tissues that affects your whole body. The infection that causes sepsis may be bacterial, viral, fungal, or parasitic. Sepsis may be life threatening. Sepsis can cause your blood pressure to drop. This may result in shock. Shock causes your central nervous system and your organs to stop working correctly. What increases the risk? Sepsis can happen in anyone, but it is more likely to happen in people who have weakened immune systems. What are the signs or symptoms? Symptoms of sepsis can include:  Fever or low body temperature (hypothermia).  Rapid breathing (hyperventilation).  Chills.  Rapid heartbeat (tachycardia).  Confusion or light-headedness.  Trouble breathing.  Urinating much less than usual.  Cool, clammy skin or red, flushed skin.  Other problems with the heart, kidneys, or brain. How is this diagnosed? Your health care provider will likely do tests to look for an infection, to see if the infection has spread to your blood, and to see how serious your condition is. Tests can include:  Blood tests, including cultures of your blood.  Cultures of other fluids from your body, such as:  Urine.  Pus from wounds.  Mucus coughed up from your lungs.  Urine tests other than cultures.  X-ray exams or other imaging tests. How is this treated? Treatment will begin with elimination of the source of infection. If your sepsis is likely caused by a bacterial or fungal infection, you will be given antibiotic or antifungal medicines. You may also receive:  Oxygen.  Fluids through an IV tube.  Medicines to increase your blood pressure.  A machine to clean your blood (dialysis) if your kidneys fail.  A machine to help you breathe if your lungs fail. Get help right away if: You get an infection or develop any of the signs and symptoms of sepsis after surgery or a hospitalization. This information is not intended to  replace advice given to you by your health care provider. Make sure you discuss any questions you have with your health care provider. Document Released: 05/17/2003 Document Revised: 01/24/2016 Document Reviewed: 04/25/2013 Elsevier Interactive Patient Education  2017 Elsevier Inc.   Cellulitis, Adult Introduction Cellulitis is a skin infection. The infected area is usually red and sore. This condition occurs most often in the arms and lower legs. It is very important to get treated for this condition. Follow these instructions at home:  Take over-the-counter and prescription medicines only as told by your doctor.  If you were prescribed an antibiotic medicine, take it as told by your doctor. Do not stop taking the antibiotic even if you start to feel better.  Drink enough fluid to keep your pee (urine) clear or pale yellow.  Do not touch or rub the infected area.  Raise (elevate) the infected area above the level of your heart while you are sitting or lying down.  Place warm or cold wet cloths (warm or cold compresses) on the infected area. Do this as told by your doctor.  Keep all follow-up visits as told by your doctor. This is important. These visits let your doctor make sure your infection is not getting worse. Contact a doctor if:  You have a fever.  Your symptoms do not get better after 1-2 days of treatment.  Your bone or joint under the infected area starts to hurt after the skin has healed.  Your infection comes back. This can happen in the same area or another area.  You have a  swollen bump in the infected area.  You have new symptoms.  You feel ill and also have muscle aches and pains. Get help right away if:  Your symptoms get worse.  You feel very sleepy.  You throw up (vomit) or have watery poop (diarrhea) for a long time.  There are red streaks coming from the infected area.  Your red area gets larger.  Your red area turns darker. This information  is not intended to replace advice given to you by your health care provider. Make sure you discuss any questions you have with your health care provider. Document Released: 02/04/2008 Document Revised: 01/24/2016 Document Reviewed: 06/27/2015  2017 Elsevier

## 2016-10-13 NOTE — Discharge Summary (Signed)
[employer] [facility] [place] at [facility]   PATIENT NAME: [name]    MR#:  [id number]  DATE OF BIRTH:  Jun 25, 1951  DATE OF ADMISSION:  10/03/2016   ADMITTING PHYSICIAN: Gwyneth RevelsJustin Fowler, DPM  DATE OF DISCHARGE: 10/13/2016  PRIMARY CARE PHYSICIAN: Abram SanderElena M Adamo, MD   ADMISSION DIAGNOSIS:   Cellulitis [L03.90] Cellulitis of right lower extremity [L03.115] Sepsis, due to unspecified organism (HCC) [A41.9]  DISCHARGE DIAGNOSIS:   Active Problems:   Septic shock (HCC)   Pressure injury of skin   SECONDARY DIAGNOSIS:   Past Medical History:  Diagnosis Date  . Diabetes mellitus without complication (HCC)   . Hypertension     HOSPITAL COURSE:   66 year old male with past medical history significant for hypertension, type 2 diabetes mellitus on insulin presents to hospital secondary to right fifth toe gangrene and cellulitis. Also noted to have acute renal failure on admission.  #1 right fifth toe cellulitis and gangrene-appreciate podiatry consult. 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 -Nonweight bearing on the right leg recommended at this time -Appreciate ID consult. Cultures are negative.  -Received Zosyn in the hospital. Due to rash, it has been discontinued. Continue on ciprofloxacin and clindamycin for 4 weeks -Continue wound VAC, wound VAC to be changed every other day. dressing changes as recommended by podiatry. 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 -ATN from sepsis and acute BC powder usage. -Lisinopril and hydrochlorothiazide are held. Creatinine at baseline at this time - Encourage oral intake  #3 peripheral vascular disease-history of smoking.  -Status post angiogram of bilateral lower extremities with angioplasty done to right dorsalis pedis artery  #4 septic shock on presentation secondary to cellulitis and diabetic foot  infection of right foot. -Resolved at this time. - Appreciate ID input - on cipro and clindamycin  #5 diabetes mellitus-continue Lantus and also NovoLog prior to meals, also on metformin  Appreciate physical therapy consult. Discharge to rehabilitation when bed available  DISCHARGE CONDITIONS:   Guarded  CONSULTS OBTAINED:   Treatment Team:  Renford DillsGregory G Schnier, MD Mady HaagensenMunsoor Lateef, MD Mick Sellavid P Fitzgerald, MD Gayla Dossavid M Fitzgerald, MD  DRUG ALLERGIES:   Allergies  Allergen Reactions  . Zosyn [Piperacillin Sod-Tazobactam So] Rash   DISCHARGE MEDICATIONS:   Allergies as of 10/13/2016      Reactions   Zosyn [piperacillin Sod-tazobactam So] Rash      Medication List    STOP taking these medications   insulin NPH-regular Human (70-30) 100 UNIT/ML injection Commonly known as:  NOVOLIN 70/30   lisinopril-hydrochlorothiazide 20-25 MG tablet Commonly known as:  PRINZIDE,ZESTORETIC     TAKE these medications   aspirin EC 81 MG tablet Take 81 mg by mouth daily.   atorvastatin 20 MG tablet Commonly known as:  LIPITOR Take 20 mg by mouth daily.   ciprofloxacin 500 MG tablet Commonly known as:  CIPRO Take 1 tablet (500 mg total) by mouth 2 (two) times daily. X 4 weeks   clindamycin 300 MG capsule Commonly known as:  CLEOCIN Take 1 capsule (300 mg total) by mouth every 8 (eight) hours. X 4 weeks What changed:  when to take this  additional instructions   docusate sodium 100 MG capsule Commonly known as:  COLACE Take 1 capsule (100 mg total) by mouth 2 (two) times daily.   insulin aspart 100 UNIT/ML injection Commonly known as:  novoLOG Inject 8 Units into  the skin 3 (three) times daily with meals.   insulin glargine 100 UNIT/ML injection Commonly known as:  LANTUS Inject 0.15 mLs (15 Units total) into the skin at bedtime.   metFORMIN 1000 MG tablet Commonly known as:  GLUCOPHAGE Take 1,000 mg by mouth 2 (two) times daily with a meal.   oxyCODONE 5 MG  immediate release tablet Commonly known as:  Oxy IR/ROXICODONE Take 1 tablet (5 mg total) by mouth every 4 (four) hours as needed for moderate pain.        DISCHARGE INSTRUCTIONS:   1. ID follow up in 2 weeks 2. PCP follow-up in 1-2 weeks 3. Podiatry follow-up in 1 week 4. Nonweight bearing on the right leg  DIET:   Cardiac diet  ACTIVITY:   Activity as tolerated  OXYGEN:   Home Oxygen: No.  Oxygen Delivery: room air  DISCHARGE LOCATION:   nursing home   If you experience worsening of your admission symptoms, develop shortness of breath, life threatening emergency, suicidal or homicidal thoughts you must seek medical attention immediately by calling 911 or calling your MD immediately  if symptoms less severe.  You Must read complete instructions/literature along with all the possible adverse reactions/side effects for all the Medicines you take and that have been prescribed to you. Take any new Medicines after you have completely understood and accpet all the possible adverse reactions/side effects.   Please note  You were cared for by a hospitalist during your hospital stay. If you have any questions about your discharge medications or the care you received while you were in the hospital after you are discharged, you can call the unit and asked to speak with the hospitalist on call if the hospitalist that took care of you is not available. Once you are discharged, your primary care physician will handle any further medical issues. Please note that NO REFILLS for any discharge medications will be authorized once you are discharged, as it is imperative that you return to your primary care physician (or establish a relationship with a primary care physician if you do not have one) for your aftercare needs so that they can reassess your need for medications and monitor your lab values.    On the day of Discharge:  VITAL SIGNS:   Blood pressure (!) 101/58, pulse 92,  temperature 98 F (36.7 C), temperature source Oral, resp. rate 18, height 5\' 11"  (1.803 m), weight 118.1 kg (260 lb 4.8 oz), SpO2 99 %.  PHYSICAL EXAMINATION:   GENERAL:  66 y.o.-year-old patient lying in the bed with no acute distress.  EYES: Pupils equal, round, reactive to light and accommodation. No scleral icterus. Extraocular muscles intact.  HEENT: Head atraumatic, normocephalic. Oropharynx and nasopharynx clear.  NECK:  Supple, no jugular venous distention. No thyroid enlargement, no tenderness.  LUNGS: Normal breath sounds bilaterally, no wheezing, rales,rhonchi or crepitation. No use of accessory muscles of respiration. Decreased bibasilar breath sounds CARDIOVASCULAR: S1, S2 normal. No murmurs, rubs, or gallops.  ABDOMEN: Soft, nontender, nondistended. Bowel sounds present. No organomegaly or mass.  EXTREMITIES: No cyanosis, or clubbing. 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 Muscle strength 5/5 in all extremities. Sensation intact. Gait not checked.  PSYCHIATRIC: The patient is alert and oriented x 3.  SKIN: macular confluent rash noted on the trunk, back, upper thighs - fading slowly  DATA REVIEW:   CBC No results for input(s): WBC, HGB, HCT,  PLT in the last 168 hours.  Chemistries   Recent Labs Lab 10/12/16 0524  NA 137  K 3.6  CL 110  CO2 21*  GLUCOSE 135*  BUN 13  CREATININE 1.20  CALCIUM 7.6*     Microbiology Results  Results for orders placed or performed during the hospital encounter of 10/03/16  Blood Culture (routine x 2)     Status: None   Collection Time: 10/03/16  1:17 PM  Result Value Ref Range Status   Specimen Description BLOOD RIGHT FOREARM  Final   Special Requests BOTTLES DRAWN AEROBIC AND ANAEROBIC AER11ML ANA9ML  Final   Culture NO GROWTH 5 DAYS  Final   Report Status 10/08/2016 FINAL  Final  Blood Culture (routine x 2)     Status: None   Collection Time:  10/03/16  1:18 PM  Result Value Ref Range Status   Specimen Description BLOOD LEFT FOREARM  Final   Special Requests BOTTLES DRAWN AEROBIC AND ANAEROBIC AER5ML ANA4ML  Final   Culture NO GROWTH 5 DAYS  Final   Report Status 10/08/2016 FINAL  Final  Urine culture     Status: None   Collection Time: 10/03/16  1:42 PM  Result Value Ref Range Status   Specimen Description URINE, RANDOM  Final   Special Requests NONE  Final   Culture   Final    NO GROWTH Performed at [facility], [address] [place], [place] [address]    Report Status 10/04/2016 FINAL  Final  Aerobic/Anaerobic Culture (surgical/deep wound)     Status: None   Collection Time: 10/03/16  5:38 PM  Result Value Ref Range Status   Specimen Description FOOT right foot abscess  Final   Special Requests NONE  Final   Gram Stain   Final    FEW WBC PRESENT, PREDOMINANTLY PMN ABUNDANT GRAM POSITIVE COCCI IN CLUSTERS ABUNDANT GRAM NEGATIVE RODS ABUNDANT GRAM POSITIVE COCCI IN PAIRS ABUNDANT GRAM POSITIVE RODS Performed at [facility], [address] [place], [place] [address]    Culture   Final    NORMAL SKIN FLORA GROWING AEROBICALLY MIXED ANAEROBIC FLORA PRESENT.  CALL LAB IF FURTHER IID REQUIRED.    Report Status 10/07/2016 FINAL  Final  MRSA PCR Screening     Status: None   Collection Time: 10/03/16  8:52 PM  Result Value Ref Range Status   MRSA by PCR NEGATIVE NEGATIVE Final    Comment:        The GeneXpert MRSA Assay (FDA approved for NASAL specimens only), is one component of a comprehensive MRSA colonization surveillance program. It is not intended to diagnose MRSA infection nor to guide or monitor treatment for MRSA infections.     RADIOLOGY:  No results found.   Management plans discussed with the patient, family and they are in agreement.  CODE STATUS:     Code Status Orders        Start     Ordered   10/03/16 1511  Full code  Continuous     10/03/16 1513    Code Status  History    Date Active Date Inactive Code Status Order ID Comments User Context   This patient has a current code status but no historical code status.      TOTAL TIME TAKING CARE OF THIS PATIENT: 37 minutes.    Enid Baas M.D on 10/13/2016 at 12:54 PM  Between 7am to 6pm - Pager - [phone]  After 6pm go to [email or web address] -  password [email or web address] [facility]  [employer] [place] [facility]  Office  [phone]  CC: Primary care physician; Abram Sander, MD   Note: This dictation was prepared with Dragon dictation along with smaller phrase technology. Any transcriptional errors that result from this process are unintentional.

## 2016-10-13 NOTE — Clinical Social Work Placement (Signed)
   CLINICAL SOCIAL WORK PLACEMENT  NOTE  Date:  10/13/2016  Patient Details  Name: [name] MRN: [id number] Date of Birth: 15-Sep-1950  Clinical Social Work is seeking post-discharge placement for this patient at the Skilled  Nursing Facility level of care (*CSW will initial, date and re-position this form in  chart as items are completed):  Yes   Patient/family provided with [place] Clinical Social Work Department's list of facilities offering this level of care within the geographic area requested by the patient (or if unable, by the patient's family).  Yes   Patient/family informed of their freedom to choose among providers that offer the needed level of care, that participate in Medicare, Medicaid or managed care program needed by the patient, have an available bed and are willing to accept the patient.  Yes   Patient/family informed of [facility]'s ownership interest in [facility] and [facility], as well as of the fact that they are under no obligation to receive care at these facilities.  PASRR submitted to EDS on 10/09/16     PASRR number received on 10/09/16     Existing PASRR number confirmed on       FL2 transmitted to all facilities in geographic area requested by pt/family on 10/09/16     FL2 transmitted to all facilities within larger geographic area on       Patient informed that his/her managed care company has contracts with or will negotiate with certain facilities, including the following:        Yes   Patient/family informed of bed offers received.  [facility]     Physician recommends and patient chooses bed at      [facility] 10[facility].  [facility]     Patient family notified on 10/13/16 of transfer.  Name of family member notified:  Patient stated he will notify his sister.     PHYSICIAN Please  sign FL2     Additional Comment:    _______________________________________________ Darleene CleaverAnterhaus, Sagar Tengan R, LCSWA 10/13/2016, 6:33 PM

## 2016-10-22 ENCOUNTER — Inpatient Hospital Stay: Admission: RE | Admit: 2016-10-22 | Payer: Medicare HMO | Source: Ambulatory Visit

## 2016-10-22 ENCOUNTER — Telehealth (INDEPENDENT_AMBULATORY_CARE_PROVIDER_SITE_OTHER): Payer: Self-pay | Admitting: Vascular Surgery

## 2016-10-22 NOTE — Telephone Encounter (Signed)
GS seen this patient as [facility]. Performed procedure on patient now patient is having issues with wound from the recent admission. Dr. Ether GriffinsFowler is suggesting addition sgy patient wants a 2nd opinion appointment with GS on what is going on with his foot before consenting to more procedures. I advised nurse that I am not sure if GS could be of assistance for the concern and that I would rely a message to find out if he would be able to be of assistance.Please contact facility with advisement.

## 2016-10-23 NOTE — Telephone Encounter (Signed)
I think [name] wants to be sure his arterial is still open before he does surgery   Can we get him in to see me next week Ill look to see what ultrasound we need

## 2016-10-24 ENCOUNTER — Ambulatory Visit: Admission: RE | Admit: 2016-10-24 | Payer: Medicare HMO | Source: Ambulatory Visit | Admitting: Podiatry

## 2016-10-24 ENCOUNTER — Encounter: Admission: RE | Payer: Self-pay | Source: Ambulatory Visit

## 2016-10-24 SURGERY — AMPUTATION, TOE
Anesthesia: Choice | Laterality: Right

## 2016-10-27 DIAGNOSIS — N181 Chronic kidney disease, stage 1: Secondary | ICD-10-CM | POA: Insufficient documentation

## 2016-10-30 ENCOUNTER — Encounter (INDEPENDENT_AMBULATORY_CARE_PROVIDER_SITE_OTHER): Payer: Self-pay | Admitting: Vascular Surgery

## 2016-10-30 ENCOUNTER — Ambulatory Visit (INDEPENDENT_AMBULATORY_CARE_PROVIDER_SITE_OTHER): Payer: Medicare HMO | Admitting: Vascular Surgery

## 2016-10-30 DIAGNOSIS — L97504 Non-pressure chronic ulcer of other part of unspecified foot with necrosis of bone: Secondary | ICD-10-CM | POA: Insufficient documentation

## 2016-10-30 DIAGNOSIS — I7025 Atherosclerosis of native arteries of other extremities with ulceration: Secondary | ICD-10-CM | POA: Diagnosis not present

## 2016-10-30 DIAGNOSIS — Z794 Long term (current) use of insulin: Secondary | ICD-10-CM

## 2016-10-30 DIAGNOSIS — I70219 Atherosclerosis of native arteries of extremities with intermittent claudication, unspecified extremity: Secondary | ICD-10-CM | POA: Insufficient documentation

## 2016-10-30 DIAGNOSIS — E782 Mixed hyperlipidemia: Secondary | ICD-10-CM | POA: Diagnosis not present

## 2016-10-30 DIAGNOSIS — L97514 Non-pressure chronic ulcer of other part of right foot with necrosis of bone: Secondary | ICD-10-CM

## 2016-10-30 DIAGNOSIS — E1152 Type 2 diabetes mellitus with diabetic peripheral angiopathy with gangrene: Secondary | ICD-10-CM | POA: Diagnosis not present

## 2016-10-30 DIAGNOSIS — E785 Hyperlipidemia, unspecified: Secondary | ICD-10-CM | POA: Insufficient documentation

## 2016-10-30 DIAGNOSIS — L97409 Non-pressure chronic ulcer of unspecified heel and midfoot with unspecified severity: Secondary | ICD-10-CM | POA: Insufficient documentation

## 2016-10-30 DIAGNOSIS — E119 Type 2 diabetes mellitus without complications: Secondary | ICD-10-CM | POA: Insufficient documentation

## 2016-10-30 NOTE — Progress Notes (Signed)
MRN : [id number]  [name] is a 66 y.o. (May 14, 1951) male who presents with chief complaint of  Chief Complaint  Patient presents with  . New Evaluation    Foot wound, seen at [facility]  .  History of Present Illness: The patient returns to the office for followup and review status post angiogram with intervention. On 10/10/2016 the patient underwent balloon angioplasty of the right dorsalis pedis artery to 2 mm. Intervention was successful. Perineal and posterior tibial were chronically occluded. The patient notes improvement in the lower extremity symptoms. No rest pain symptoms. Previous wounds persist.  No new ulcers or wounds have occurred since the last visit.  There have been no significant changes to the patient's overall health care.  The patient denies amaurosis fugax or recent TIA symptoms. There are no recent neurological changes noted. The patient denies history of DVT, PE or superficial thrombophlebitis. The patient denies recent episodes of angina or shortness of breath.   Current Meds  Medication Sig  . insulin lispro (HUMALOG) 100 UNIT/ML injection Inject 8 Units into the skin 3 (three) times daily before meals.  . [DISCONTINUED] insulin glargine (LANTUS) 100 unit/mL SOPN Inject into the skin.    Past Medical History:  Diagnosis Date  . Diabetes mellitus without complication (HCC)   . Hypertension     Past Surgical History:  Procedure Laterality Date  . ABDOMINAL AORTOGRAM W/LOWER EXTREMITY N/A 10/10/2016   Procedure: Abdominal Aortogram w/Lower Extremity;  Surgeon: Renford Dills, MD;  Location: [facility] INVASIVE CV LAB;  Service: Cardiovascular;  Laterality: N/A;  . IRRIGATION AND DEBRIDEMENT FOOT Right 10/03/2016   Procedure: IRRIGATION AND DEBRIDEMENT FOOT;  Surgeon: Gwyneth Revels, DPM;  Location: [facility] ORS;  Service: Podiatry;  Laterality: Right;  . LOWER EXTREMITY INTERVENTION  10/10/2016   Procedure: Lower Extremity Intervention;  Surgeon: Renford Dills, MD;  Location: [facility] INVASIVE CV LAB;  Service: Cardiovascular;;  . PERIPHERAL VASCULAR BALLOON ANGIOPLASTY Left 10/10/2016   Procedure: Peripheral Vascular Balloon Angioplasty;  Surgeon: Renford Dills, MD;  Location: [facility] INVASIVE CV LAB;  Service: Cardiovascular;  Laterality: Left;    Social History Social History  Substance Use Topics  . Smoking status: Former Games developer  . Smokeless tobacco: Never Used  . Alcohol use No    Family History Family History  Problem Relation Age of Onset  . Family history unknown: Yes  No family history of bleeding/clotting disorders, porphyria or autoimmune disease   Allergies  Allergen Reactions  . Sulfa Antibiotics Rash  . Zosyn [Piperacillin Sod-Tazobactam So] Rash     REVIEW OF SYSTEMS (Negative unless checked)  Constitutional: [] Weight loss  [] Fever  [] Chills Cardiac: [] Chest pain   [] Chest pressure   [] Palpitations   [] Shortness of breath when laying flat   [] Shortness of breath with exertion. Vascular:  [] Pain in legs with walking   [x] Pain in legs at rest  [] History of DVT   [] Phlebitis   [x] Swelling in legs   [] Varicose veins   [x] Non-healing ulcers Pulmonary:   [] Uses home oxygen   [] Productive cough   [] Hemoptysis   [] Wheeze  [] COPD   [] Asthma Neurologic:  [] Dizziness   [] Seizures   [] History of stroke   [] History of TIA  [] Aphasia   [] Vissual changes   [] Weakness or numbness in arm   [] Weakness or numbness in leg Musculoskeletal:   [] Joint swelling   [x] Joint pain   [] Low back pain Hematologic:  [] Easy bruising  [] Easy bleeding   [] Hypercoagulable state   []   Anemic Gastrointestinal:  [] Diarrhea   [] Vomiting  [] Gastroesophageal reflux/heartburn   [] Difficulty swallowing. Genitourinary:  [x] Chronic kidney disease   [] Difficult urination  [] Frequent urination   [] Blood in urine Skin:  [] Rashes   [x] Ulcers  Psychological:  [] History of anxiety   []  History of major depression.  Physical Examination  Vitals:   10/30/16 1042    BP: 118/67  Pulse: (!) 107  Resp: 16  Weight: 245 lb (111.1 kg)  Height: 5\' 11"  (1.803 m)   Body mass index is 34.17 kg/m. Gen: WD/WN, NAD Head: /AT, No temporalis wasting.  Ear/Nose/Throat: Hearing grossly intact, nares w/o erythema or drainage, poor dentition Eyes: PER, EOMI, sclera nonicteric.  Neck: Supple, no masses.  No bruit or JVD.  Pulmonary:  Good air movement, clear to auscultation bilaterally, no use of accessory muscles.  Cardiac: RRR, normal S1, S2, no Murmurs. Vascular: Right foot is warm with 2 second capillary refill. The large ulceration of the lateral foot appears quite desiccated however there is no odor there is no drainage or pus. There is 3+ pitting edema of the dorsum of the foot and ankle Vessel Right Left  Radial Palpable Palpable  Ulnar Palpable Palpable  Brachial Palpable Palpable  Carotid Palpable Palpable  Femoral Palpable Palpable  Popliteal Palpable Palpable  PT Not Palpable Not Palpable  DP Not Palpable Not Palpable   Gastrointestinal: soft, non-distended. No guarding/no peritoneal signs.  Musculoskeletal: M/S 3/5In the right leg 5 out of 5 throughout the remaining extremities.  No deformity or atrophy of the upper extremities.  Neurologic: CN 2-12 intact. Pain and light touch intact in extremities.  Symmetrical.  Speech is fluent. Motor exam as listed above. Psychiatric: Judgment intact, Mood & affect appropriate for pt's clinical situation. Dermatologic: No rashes but ulcer of the right foot is noted.  No changes consistent with cellulitis. Lymph : No Cervical lymphadenopathy, no lichenification or skin changes of chronic lymphedema.  CBC Lab Results  Component Value Date   WBC 11.8 (H) 10/05/2016   HGB 10.1 (L) 10/05/2016   HCT 30.4 (L) 10/05/2016   MCV 87.2 10/05/2016   PLT 351 10/05/2016    BMET    Component Value Date/Time   NA 137 10/12/2016 0524   NA 140 04/04/2013 1238   K 3.6 10/12/2016 0524   K 4.1 04/04/2013 1238   CL  110 10/12/2016 0524   CL 108 (H) 04/04/2013 1238   CO2 21 (L) 10/12/2016 0524   CO2 30 04/04/2013 1238   GLUCOSE 135 (H) 10/12/2016 0524   GLUCOSE 98 04/04/2013 1238   BUN 13 10/12/2016 0524   BUN 19 (H) 04/04/2013 1238   CREATININE 1.20 10/12/2016 0524   CREATININE 0.87 04/04/2013 1238   CALCIUM 7.6 (L) 10/12/2016 0524   CALCIUM 8.8 04/04/2013 1238   GFRNONAA >60 10/12/2016 0524   GFRNONAA >60 04/04/2013 1238   GFRAA >60 10/12/2016 0524   GFRAA >60 04/04/2013 1238   Estimated Creatinine Clearance: 76.7 mL/min (by C-G formula based on SCr of 1.2 mg/dL).  COAG No results found for: INR, PROTIME  Radiology [place] Renal  Result Date: 10/04/2016 CLINICAL DATA:  Acute renal failure. EXAM: RENAL / URINARY TRACT ULTRASOUND COMPLETE COMPARISON:  None. FINDINGS: Right Kidney: Length: 12.9 cm. 4.8 cm simple cyst is seen in upper pole. Echogenicity within normal limits. No mass or hydronephrosis visualized. Left Kidney: Length: 12.2 cm. Echogenicity within normal limits. No mass or hydronephrosis visualized. Bladder: Appears normal for degree of bladder distention. Bilateral ureteral jets are noted.  IMPRESSION: 4.8 cm simple right renal cyst.  No other renal abnormality seen. Electronically Signed   By: Lupita Raider, M.D.   On: 10/04/2016 14:22   Dg Foot Complete Right  Result Date: 10/03/2016 CLINICAL DATA:  Swelling right foot. EXAM: RIGHT FOOT COMPLETE - 3+ VIEW COMPARISON:  No recent prior. FINDINGS: Prominent tissue swelling is noted particularly prominent over the lateral and plantar aspect of the right foot. Associated soft tissue air is noted. These findings suggest soft tissue infection. No clearcut focal bony abnormality identified. If osteomyelitis is of clinical concern MRI can be obtained. No evidence of fracture or dislocation. Peripheral vascular calcification. IMPRESSION: 1. Extensive soft tissue swelling with prominent amount of air noted particularly along the lateral and plantar  aspect of the right foot consistent with soft tissue infection. No focal underlying bony abnormality. If osteomyelitis is of clinical concern MRI should be considered. 2. Peripheral vascular disease . These results will be called to the ordering clinician or representative by the Radiologist Assistant, and communication documented in the PACS or zVision Dashboard. Electronically Signed   By: Maisie Fus  Register   On: 10/03/2016 13:50    Assessment/Plan 1. Atherosclerosis of native arteries of the extremities with ulceration (HCC) Recommend:  Patient should undergo arterial duplex of the lower extremity because there has not been significant improvement in the patient's lower extremity symptoms.  The patient states they are still having pain.  The risks and benefits as well as the alternatives were discussed in detail with the patient.  All questions were answered.  Patient agrees to proceed and understands this could be a prelude to angiography and intervention.  The patient will follow up with me in the office to review the studies.  - VAS [place] LOWER EXTREMITY ARTERIAL DUPLEX; Future - VAS [place] ABI WITH/WO TBI; Future  2. 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 Type 2 diabetes mellitus with diabetic peripheral angiopathy and gangrene, with long-term current use of insulin (HCC) Continue hypoglycemic medications as already ordered, these medications have been reviewed and there are no changes at this time.  Hgb A1C to be monitored as already arranged by primary service   4. Mixed hyperlipidemia Continue statin as ordered and reviewed, no changes at this time     Levora Dredge, MD  10/30/2016 9:18 PM

## 2016-11-03 ENCOUNTER — Encounter
Admission: RE | Admit: 2016-11-03 | Discharge: 2016-11-03 | Disposition: A | Discharge status: Home / Self Care | Payer: Medicare HMO | Source: Ambulatory Visit | Attending: Podiatry | Admitting: Podiatry

## 2016-11-03 DIAGNOSIS — E119 Type 2 diabetes mellitus without complications: Secondary | ICD-10-CM | POA: Insufficient documentation

## 2016-11-03 DIAGNOSIS — I7025 Atherosclerosis of native arteries of other extremities with ulceration: Secondary | ICD-10-CM | POA: Insufficient documentation

## 2016-11-03 DIAGNOSIS — Z01812 Encounter for preprocedural laboratory examination: Secondary | ICD-10-CM | POA: Diagnosis not present

## 2016-11-03 DIAGNOSIS — E785 Hyperlipidemia, unspecified: Secondary | ICD-10-CM | POA: Insufficient documentation

## 2016-11-03 DIAGNOSIS — L97504 Non-pressure chronic ulcer of other part of unspecified foot with necrosis of bone: Secondary | ICD-10-CM | POA: Diagnosis not present

## 2016-11-03 DIAGNOSIS — R6521 Severe sepsis with septic shock: Secondary | ICD-10-CM | POA: Insufficient documentation

## 2016-11-03 DIAGNOSIS — L8990 Pressure ulcer of unspecified site, unspecified stage: Secondary | ICD-10-CM | POA: Insufficient documentation

## 2016-11-03 HISTORY — DX: Unspecified osteoarthritis, unspecified site: M19.90

## 2016-11-03 HISTORY — DX: Peripheral vascular disease, unspecified: I73.9

## 2016-11-03 HISTORY — DX: Sepsis, unspecified organism: A41.9

## 2016-11-03 HISTORY — DX: Unspecified urinary incontinence: R32

## 2016-11-03 HISTORY — DX: Chronic kidney disease, unspecified: N18.9

## 2016-11-03 HISTORY — DX: Dyspnea, unspecified: R06.00

## 2016-11-03 HISTORY — DX: Hyperlipidemia, unspecified: E78.5

## 2016-11-03 HISTORY — DX: Unspecified dementia, unspecified severity, without behavioral disturbance, psychotic disturbance, mood disturbance, and anxiety: F03.90

## 2016-11-03 LAB — BASIC METABOLIC PANEL
Anion gap: 10 (ref 5–15)
BUN: 14 mg/dL (ref 6–20)
CALCIUM: 9.3 mg/dL (ref 8.9–10.3)
CHLORIDE: 103 mmol/L (ref 101–111)
CO2: 25 mmol/L (ref 22–32)
CREATININE: 0.91 mg/dL (ref 0.61–1.24)
GFR calc Af Amer: >60 mL/min (ref >60)
GFR calc non Af Amer: >60 mL/min (ref >60)
GLUCOSE: 108 mg/dL — AB (ref 65–99)
Potassium: 4.6 mmol/L (ref 3.5–5.1)
Sodium: 138 mmol/L (ref 135–145)

## 2016-11-03 LAB — CBC
HEMATOCRIT: 32.8 % — AB (ref 40.0–52.0)
HEMOGLOBIN: 10.8 g/dL — AB (ref 13.0–18.0)
MCH: 28 pg (ref 26.0–34.0)
MCHC: 33 g/dL (ref 32.0–36.0)
MCV: 84.6 fL (ref 80.0–100.0)
Platelets: 552 10*3/uL — ABNORMAL HIGH (ref 150–440)
RBC: 3.88 MIL/uL — ABNORMAL LOW (ref 4.40–5.90)
RDW: 15.4 % — ABNORMAL HIGH (ref 11.5–14.5)
WBC: 7.4 10*3/uL (ref 3.8–10.6)

## 2016-11-03 LAB — DIFFERENTIAL
Basophils Absolute: 0.1 10*3/uL (ref 0–0.1)
Basophils Relative: 1 %
EOS ABS: 1.2 10*3/uL — AB (ref 0–0.7)
EOS PCT: 16 %
LYMPHS ABS: 1.4 10*3/uL (ref 1.0–3.6)
LYMPHS PCT: 19 %
MONOS PCT: 8 %
Monocytes Absolute: 0.6 10*3/uL (ref 0.2–1.0)
Neutro Abs: 4.1 10*3/uL (ref 1.4–6.5)
Neutrophils Relative %: 56 %

## 2016-11-03 NOTE — Patient Instructions (Signed)
  Your procedure is scheduled on: November 07, 2016 (FRIDAY) Report to Same Day Surgery 2nd floor medical mall (Medical Mall Entrance-take elevator on left to 2nd floor.  Check in with surgery information desk.) To find out your arrival time please call [phone] between 1PM - 3PM on November 06, 2016 (THURSDAY)   Remember: Instructions that are not followed completely may result in serious medical risk, up to and including death, or upon the discretion of your surgeon and anesthesiologist your surgery may need to be rescheduled.    _x___ 1. Do not eat food or drink liquids after midnight. No gum chewing or hard candies.     __x__ 2. No Alcohol for 24 hours before or after surgery.   __x__3. No Smoking for 24 prior to surgery.   ____  4. Bring all medications with you on the day of surgery if instructed.    __x__ 5. Notify your doctor if there is any change in your medical condition     (cold, fever, infections).     Do not wear jewelry, make-up, hairpins, clips or nail polish.  Do not wear lotions, powders, or perfumes. You may wear deodorant.  Do not shave 48 hours prior to surgery. Men may shave face and neck.  Do not bring valuables to the hospital.    [facility] not [facility] for any belongings or valuables.               Contacts, dentures or bridgework may not be worn into surgery.  Leave your suitcase in the car. After surgery it may be brought to your room.  For patients admitted to the hospital, discharge time is determined by your treatment team.   Patients discharged the day of surgery will not be allowed to drive home.  You will need someone to drive you home and stay with you the night of your procedure.    Please read over the following fact sheets that you were given:   [facility] Pre[facility] and or MRSA Information   ___ Take these medicines the morning of surgery with A SIP OF WATER:    1.   2.  3.  4.  5.  6.  ____Fleets enema or Magnesium  Citrate as directed.   _x___ Use CHG Soap or sage wipes as directed on instruction sheet   ____ Use inhalers on the day of surgery and bring to hospital day of surgery  _x___ Stop metformin 2 days prior to surgery (STOP METFORMIN ON MARCH 7 )    _x___ Take 1/2 of usual insulin dose the night before surgery and none on the morning of surgery (TAKE ONE -HALF DOSE OF LANTUS INSULIN AT BEDTIME ON THURSDAY NIGHT, AND NO INSULIN THE MORNING OF SURGERY   )      _x___ Stop Aspirin, Coumadin, Pllavix ,Eliquis, Effient, or Pradaxa (PATIENT STATED HE HAS STOPPED ASPIRIN, STATED HE HAS NOT RECEIVED ANY ASPIRIN SINCE HE HAS BEEN AT [facility]) NO ASPIRIN   x__ Stop Anti-inflammatories such as Advil, Aleve, Ibuprofen, Motrin, Naproxen,          Naprosyn, Goodies powders or aspirin products. Ok to take Tylenol.   ____ Stop supplements until after surgery.    ____ Bring C-Pap to the hospital.   X BRING OR SEND WOUND VAC AND SUPPLIES WITH PATIENT TO HOSPITAL THE DAY OF SURGERY

## 2016-11-03 NOTE — Pre-Procedure Instructions (Signed)
EKG/ED INTERPRETATION ON CHART. PATIENT HAD SURGERY DURING 2/18 ADMISSION

## 2016-11-06 MED ORDER — CLINDAMYCIN PHOSPHATE 600 MG/50ML IV SOLN
600.0000 mg | Freq: Once | INTRAVENOUS | Status: AC
  Administered 2016-11-07: 600 mg via INTRAVENOUS

## 2016-11-07 ENCOUNTER — Encounter: Payer: Self-pay | Admitting: *Deleted

## 2016-11-07 ENCOUNTER — Ambulatory Visit
Admission: RE | Admit: 2016-11-07 | Discharge: 2016-11-07 | Disposition: A | Discharge status: Home / Self Care | Payer: Medicare HMO | Source: Ambulatory Visit | Attending: Podiatry | Admitting: Podiatry

## 2016-11-07 ENCOUNTER — Encounter
Admission: RE | Disposition: A | Discharge status: Home / Self Care | Payer: Self-pay | Source: Ambulatory Visit | Attending: Podiatry

## 2016-11-07 ENCOUNTER — Ambulatory Visit: Payer: Medicare HMO | Admitting: Anesthesiology

## 2016-11-07 DIAGNOSIS — Z79899 Other long term (current) drug therapy: Secondary | ICD-10-CM | POA: Insufficient documentation

## 2016-11-07 DIAGNOSIS — I10 Essential (primary) hypertension: Secondary | ICD-10-CM | POA: Insufficient documentation

## 2016-11-07 DIAGNOSIS — Z87891 Personal history of nicotine dependence: Secondary | ICD-10-CM | POA: Diagnosis not present

## 2016-11-07 DIAGNOSIS — Z9862 Peripheral vascular angioplasty status: Secondary | ICD-10-CM | POA: Insufficient documentation

## 2016-11-07 DIAGNOSIS — E1152 Type 2 diabetes mellitus with diabetic peripheral angiopathy with gangrene: Secondary | ICD-10-CM | POA: Diagnosis not present

## 2016-11-07 DIAGNOSIS — Z888 Allergy status to other drugs, medicaments and biological substances status: Secondary | ICD-10-CM | POA: Diagnosis not present

## 2016-11-07 DIAGNOSIS — Z7982 Long term (current) use of aspirin: Secondary | ICD-10-CM | POA: Diagnosis not present

## 2016-11-07 DIAGNOSIS — I96 Gangrene, not elsewhere classified: Secondary | ICD-10-CM | POA: Diagnosis present

## 2016-11-07 DIAGNOSIS — Z794 Long term (current) use of insulin: Secondary | ICD-10-CM | POA: Insufficient documentation

## 2016-11-07 DIAGNOSIS — E11621 Type 2 diabetes mellitus with foot ulcer: Secondary | ICD-10-CM | POA: Insufficient documentation

## 2016-11-07 HISTORY — PX: WOUND DEBRIDEMENT: SHX247

## 2016-11-07 LAB — GLUCOSE, CAPILLARY
Glucose-Capillary: 149 mg/dL — ABNORMAL HIGH (ref 65–99)
Glucose-Capillary: 134 mg/dL — ABNORMAL HIGH (ref 65–99)

## 2016-11-07 SURGERY — DEBRIDEMENT, WOUND
Anesthesia: General | Site: Foot | Laterality: Right | Wound class: Dirty or Infected

## 2016-11-07 MED ORDER — FENTANYL CITRATE (PF) 100 MCG/2ML IJ SOLN: 25.0000 ug | INTRAMUSCULAR | Status: DC | PRN

## 2016-11-07 MED ORDER — PROPOFOL 10 MG/ML IV BOLUS
INTRAVENOUS | Status: AC
  Filled 2016-11-07: qty 20

## 2016-11-07 MED ORDER — PROPOFOL 500 MG/50ML IV EMUL
INTRAVENOUS | Status: DC | PRN
  Administered 2016-11-07: 50 ug/kg/min via INTRAVENOUS

## 2016-11-07 MED ORDER — FAMOTIDINE 20 MG PO TABS
ORAL_TABLET | ORAL | Status: AC
  Administered 2016-11-07: 20 mg via ORAL
  Filled 2016-11-07: qty 1

## 2016-11-07 MED ORDER — FAMOTIDINE 20 MG PO TABS
20.0000 mg | ORAL_TABLET | Freq: Once | ORAL | Status: AC
  Administered 2016-11-07: 20 mg via ORAL

## 2016-11-07 MED ORDER — BUPIVACAINE HCL 0.5 % IJ SOLN
INTRAMUSCULAR | Status: DC | PRN
  Administered 2016-11-07: 7.5 mL

## 2016-11-07 MED ORDER — OXYCODONE HCL 5 MG PO TABS
5.0000 mg | ORAL_TABLET | Freq: Once | ORAL | Status: AC
  Administered 2016-11-07: 5 mg via ORAL
  Filled 2016-11-07: qty 1

## 2016-11-07 MED ORDER — DEXMEDETOMIDINE HCL IN NACL 200 MCG/50ML IV SOLN
INTRAVENOUS | Status: DC | PRN
  Administered 2016-11-07: 8 ug via INTRAVENOUS
  Administered 2016-11-07: 12 ug via INTRAVENOUS

## 2016-11-07 MED ORDER — FENTANYL CITRATE (PF) 100 MCG/2ML IJ SOLN
INTRAMUSCULAR | Status: DC | PRN
  Administered 2016-11-07: 25 ug via INTRAVENOUS
  Administered 2016-11-07: 50 ug via INTRAVENOUS

## 2016-11-07 MED ORDER — ONDANSETRON HCL 4 MG/2ML IJ SOLN: 4.0000 mg | Freq: Once | INTRAMUSCULAR | Status: DC | PRN

## 2016-11-07 MED ORDER — FENTANYL CITRATE (PF) 100 MCG/2ML IJ SOLN
INTRAMUSCULAR | Status: AC
  Filled 2016-11-07: qty 2

## 2016-11-07 MED ORDER — LIDOCAINE HCL (PF) 1 % IJ SOLN
INTRAMUSCULAR | Status: DC | PRN
Start: 2016-11-07 — End: 2016-11-07
  Administered 2016-11-07: 7.5 mL

## 2016-11-07 MED ORDER — CLINDAMYCIN PHOSPHATE 600 MG/50ML IV SOLN
INTRAVENOUS | Status: AC
  Filled 2016-11-07: qty 50

## 2016-11-07 MED ORDER — PROPOFOL 10 MG/ML IV BOLUS
INTRAVENOUS | Status: DC | PRN
  Administered 2016-11-07: 20 mg via INTRAVENOUS
  Administered 2016-11-07: 30 mg via INTRAVENOUS

## 2016-11-07 MED ORDER — LIDOCAINE HCL (CARDIAC) 20 MG/ML IV SOLN
INTRAVENOUS | Status: DC | PRN
  Administered 2016-11-07 (×2): 50 mg via INTRAVENOUS

## 2016-11-07 MED ORDER — LIDOCAINE HCL (PF) 1 % IJ SOLN
INTRAMUSCULAR | Status: AC
  Filled 2016-11-07: qty 30

## 2016-11-07 MED ORDER — MIDAZOLAM HCL 2 MG/2ML IJ SOLN
INTRAMUSCULAR | Status: DC | PRN
  Administered 2016-11-07: 2 mg via INTRAVENOUS

## 2016-11-07 MED ORDER — ONDANSETRON HCL 4 MG/2ML IJ SOLN
INTRAMUSCULAR | Status: DC | PRN
  Administered 2016-11-07: 4 mg via INTRAVENOUS

## 2016-11-07 MED ORDER — SODIUM CHLORIDE 0.9 % IV SOLN
INTRAVENOUS | Status: DC
  Administered 2016-11-07: 07:00:00 via INTRAVENOUS

## 2016-11-07 MED ORDER — BUPIVACAINE HCL (PF) 0.5 % IJ SOLN
INTRAMUSCULAR | Status: AC
  Filled 2016-11-07: qty 30

## 2016-11-07 MED ORDER — OXYCODONE HCL 5 MG PO TABS
ORAL_TABLET | ORAL | Status: AC
  Administered 2016-11-07: 5 mg via ORAL
  Filled 2016-11-07: qty 1

## 2016-11-07 MED ORDER — MIDAZOLAM HCL 2 MG/2ML IJ SOLN
INTRAMUSCULAR | Status: AC
  Filled 2016-11-07: qty 2

## 2016-11-07 SURGICAL SUPPLY — 47 items
BANDAGE ELASTIC 4 LF NS (GAUZE/BANDAGES/DRESSINGS) ×3 IMPLANT
BANDAGE STRETCH 3X4.1 STRL (GAUZE/BANDAGES/DRESSINGS) IMPLANT
BLADE OSC/SAGITTAL MD 5.5X18 (BLADE) ×3 IMPLANT
BLADE SURG MINI STRL (BLADE) ×3 IMPLANT
BNDG ESMARK 4X12 TAN STRL LF (GAUZE/BANDAGES/DRESSINGS) ×3 IMPLANT
BNDG GAUZE 4.5X4.1 6PLY STRL (MISCELLANEOUS) ×3 IMPLANT
CANISTER SUCT 1200ML W/VALVE (MISCELLANEOUS) ×3 IMPLANT
CUFF TOURN SGL QUICK 18 (TOURNIQUET CUFF) ×3 IMPLANT
DRAPE FLUOR MINI C-ARM 54X84 (DRAPES) ×3 IMPLANT
DRAPE XRAY CASSETTE 23X24 (DRAPES) ×3 IMPLANT
DRSG TEGADERM 4X4.75 (GAUZE/BANDAGES/DRESSINGS) ×3 IMPLANT
DURAPREP 26ML APPLICATOR (WOUND CARE) ×3 IMPLANT
ELECT REM PT RETURN 9FT ADLT (ELECTROSURGICAL) ×3
ELECTRODE REM PT RTRN 9FT ADLT (ELECTROSURGICAL) ×2 IMPLANT
GAUZE IODOFORM PACK 1/2 7832 (GAUZE/BANDAGES/DRESSINGS) IMPLANT
GAUZE PETRO XEROFOAM 1X8 (MISCELLANEOUS) IMPLANT
GAUZE SPONGE 4X4 12PLY STRL (GAUZE/BANDAGES/DRESSINGS) IMPLANT
GAUZE STRETCH 2X75IN STRL (MISCELLANEOUS) IMPLANT
GLOVE BIO SURGEON STRL SZ7.5 (GLOVE) ×3 IMPLANT
GLOVE INDICATOR 8.0 STRL GRN (GLOVE) ×12 IMPLANT
GOWN STRL REUS W/ TWL LRG LVL3 (GOWN DISPOSABLE) ×6 IMPLANT
GOWN STRL REUS W/TWL LRG LVL3 (GOWN DISPOSABLE) ×3
HANDPIECE INTERPULSE COAX TIP (DISPOSABLE)
HANDPIECE VERSAJET DEBRIDEMENT (MISCELLANEOUS) ×3 IMPLANT
IV NS 1000ML (IV SOLUTION) ×1
IV NS 1000ML BAXH (IV SOLUTION) ×2 IMPLANT
KIT RM TURNOVER STRD PROC AR (KITS) ×3 IMPLANT
LABEL OR SOLS (LABEL) ×3 IMPLANT
NDL SAFETY ECLIPSE 18X1.5 (NEEDLE) ×2 IMPLANT
NEEDLE FILTER BLUNT 18X 1/2SAF (NEEDLE) ×1
NEEDLE FILTER BLUNT 18X1 1/2 (NEEDLE) ×2 IMPLANT
NEEDLE HYPO 18GX1.5 SHARP (NEEDLE) ×1
NEEDLE HYPO 25X1 1.5 SAFETY (NEEDLE) ×3 IMPLANT
NS IRRIG 500ML POUR BTL (IV SOLUTION) ×3 IMPLANT
PACK EXTREMITY ARMC (MISCELLANEOUS) ×3 IMPLANT
PAD ABD DERMACEA PRESS 5X9 (GAUZE/BANDAGES/DRESSINGS) IMPLANT
SET HNDPC FAN SPRY TIP SCT (DISPOSABLE) IMPLANT
SOL .9 NS 3000ML IRR  AL (IV SOLUTION)
SOL .9 NS 3000ML IRR UROMATIC (IV SOLUTION) IMPLANT
STOCKINETTE M/LG 89821 (MISCELLANEOUS) ×3 IMPLANT
STRAP SAFETY BODY (MISCELLANEOUS) ×3 IMPLANT
SUT ETHILON 3-0 FS-10 30 BLK (SUTURE) ×3
SUT ETHILON 5-0 FS-2 18 BLK (SUTURE) ×3 IMPLANT
SUT VIC AB 4-0 FS2 27 (SUTURE) ×3 IMPLANT
SUTURE EHLN 3-0 FS-10 30 BLK (SUTURE) ×2 IMPLANT
SYR 10ML LL (SYRINGE) ×6 IMPLANT
SYRINGE 10CC LL (SYRINGE) ×9 IMPLANT

## 2016-11-07 NOTE — Discharge Instructions (Signed)
Keep wound vac intact at all times.  Change 3 times per week.  NWB to right foot.     POST OPERATIVE INSTRUCTIONS FOR DR. TROXLER AND [facility]   1. Take your medication as prescribed.  Pain medication should be taken only as needed.  2. Keep the dressing clean, dry and intact.  3. Keep your foot elevated above the heart level for the first 48 hours.  4. Walking to the bathroom and brief periods of walking are acceptable, unless we have instructed you to be non-weight bearing.  5. Always wear your post-op shoe when walking.  Always use your crutches if you are to be non-weight bearing.  6. Do not take a shower. Baths are permissible as long as the foot is kept out of the water.   7. Every hour you are awake:  - Bend your knee 15 times. - Massage calf 15 times  8. Call [facility] [phone]) if any of the following problems occur: - You develop a temperature or fever. - The bandage becomes saturated with blood. - Medication does not stop your pain. - Injury of the foot occurs. - Any symptoms of infection including redness, odor, or red streaks running from wound.   AMBULATORY SURGERY  DISCHARGE INSTRUCTIONS   1) The drugs that you were given will stay in your system until tomorrow so for the next 24 hours you should not:  A) Drive an automobile B) Make any legal decisions C) Drink any alcoholic beverage   2) You may resume regular meals tomorrow.  Today it is better to start with liquids and gradually work up to solid foods.  You may eat anything you prefer, but it is better to start with liquids, then soup and crackers, and gradually work up to solid foods.   3) Please notify your doctor immediately if you have any unusual bleeding, trouble breathing, redness and pain at the surgery site, drainage, fever, or pain not relieved by medication.    4) Additional Instructions:        Please contact your physician  with any problems or Same Day Surgery at [phone], Monday through Friday 6 am to 4 pm, or [place] at [facility] Main number at [phone].

## 2016-11-07 NOTE — Anesthesia Post-op Follow-up Note (Cosign Needed)
Anesthesia QCDR form completed.        

## 2016-11-07 NOTE — H&P (Signed)
  HISTORY AND PHYSICAL INTERVAL NOTE:  11/07/2016  7:15 AM  Danford BadWilbert O Desai  has presented today for surgery, with the diagnosis of skin ulcer,right foot.  The various methods of treatment have been discussed with the patient.  No guarantees were given.  After consideration of risks, benefits and other options for treatment, the patient has consented to surgery.  I have reviewed the patients' chart and labs.    Patient Vitals for the past 24 hrs:  BP Temp Temp src Pulse Resp SpO2 Height Weight  11/07/16 0630 137/87 97.4 F (36.3 C) - 97 - 99 % - -  11/07/16 [id number] - - Tympanic - 18 - 6' (1.829 m) 110.7 kg (244 lb)    A history and physical examination was performed in my office.  The patient was reexamined.  There have been no changes to this history and physical examination.  Gwyneth RevelsFowler, Million Maharaj A

## 2016-11-07 NOTE — Anesthesia Postprocedure Evaluation (Signed)
Anesthesia Post Note  Patient: [name]  Procedure(s) Performed: Procedure(s) (LRB): DEBRIDEMENT OF WOUND AND BONE RIGHT FOOT AND APPLY WOUND VAC (Right)  Patient location during evaluation: PACU Anesthesia Type: General Level of consciousness: awake and alert Pain management: pain level controlled Vital Signs Assessment: post-procedure vital signs reviewed and stable Respiratory status: spontaneous breathing, nonlabored ventilation, respiratory function stable and patient connected to nasal cannula oxygen Cardiovascular status: blood pressure returned to baseline and stable Postop Assessment: no signs of nausea or vomiting Anesthetic complications: no     Last Vitals:  Vitals:   11/07/16 0912 11/07/16 0935  BP: 120/64 112/66  Pulse: 90 88  Resp: 20 16  Temp:  (!) 36.1 C    Last Pain:  Vitals:   11/07/16 0935  TempSrc: Temporal  PainSc:                  Yevette EdwardsJames G Jolanda Mccann

## 2016-11-07 NOTE — Anesthesia Preprocedure Evaluation (Signed)
Anesthesia Evaluation  Patient identified by MRN, date of birth, ID band Patient awake    Reviewed: Allergy & Precautions, H&P , NPO status , Patient's Chart, lab work & pertinent test results, reviewed documented beta blocker date and time   Airway Mallampati: II   Neck ROM: full    Dental  (+) Poor Dentition   Pulmonary neg pulmonary ROS, shortness of breath, former smoker,    Pulmonary exam normal        Cardiovascular hypertension, + Peripheral Vascular Disease  negative cardio ROS Normal cardiovascular exam Rhythm:regular Rate:Normal     Neuro/Psych negative neurological ROS  negative psych ROS   GI/Hepatic negative GI ROS, Neg liver ROS,   Endo/Other  negative endocrine ROSdiabetes  Renal/GU Renal diseasenegative Renal ROS  negative genitourinary   Musculoskeletal   Abdominal   Peds  Hematology negative hematology ROS (+)   Anesthesia Other Findings Past Medical History: No date: Arthritis No date: Bladder incontinence No date: Chronic kidney disease     Comment: Stage 3 Kidney disease No date: Dementia No date: Diabetes mellitus without complication (HCC) No date: Dyspnea     Comment: occassional No date: Hyperlipidemia No date: Hypertension No date: Peripheral vascular disease (HCC) No date: Sepsis (HCC) Past Surgical History: 10/10/2016: ABDOMINAL AORTOGRAM W/LOWER EXTREMITY N/A     Comment: Procedure: Abdominal Aortogram w/Lower               Extremity;  Surgeon: Renford DillsGregory G Schnier, MD;                Location: [facility] INVASIVE CV LAB;  Service:               Cardiovascular;  Laterality: N/A; No date: EYE SURGERY Left     Comment: Retina Detachment 10/03/2016: IRRIGATION AND DEBRIDEMENT FOOT Right     Comment: Procedure: IRRIGATION AND DEBRIDEMENT FOOT;                Surgeon: Gwyneth RevelsJustin Fowler, DPM;  Location: [facility]               ORS;  Service: Podiatry;  Laterality: Right; 10/10/2016: LOWER EXTREMITY  INTERVENTION     Comment: Procedure: Lower Extremity Intervention;                Surgeon: Renford DillsGregory G Schnier, MD;  Location: [facility]              INVASIVE CV LAB;  Service: Cardiovascular;; 10/10/2016: PERIPHERAL VASCULAR BALLOON ANGIOPLASTY Left     Comment: Procedure: Peripheral Vascular Balloon               Angioplasty;  Surgeon: Renford DillsGregory G Schnier, MD;                Location: [facility] INVASIVE CV LAB;  Service:               Cardiovascular;  Laterality: Left; No date: TONSILLECTOMY BMI    Body Mass Index:  33.09 kg/m     Reproductive/Obstetrics negative OB ROS                             Anesthesia Physical Anesthesia Plan  ASA: III  Anesthesia Plan: General   Post-op Pain Management:    Induction:   Airway Management Planned:   Additional Equipment:   Intra-op Plan:   Post-operative Plan:   Informed Consent: I have reviewed the patients History and Physical, chart, labs and discussed the procedure  including the risks, benefits and alternatives for the proposed anesthesia with the patient or authorized representative who has indicated his/her understanding and acceptance.   Dental Advisory Given  Plan Discussed with: CRNA  Anesthesia Plan Comments:         Anesthesia Quick Evaluation

## 2016-11-07 NOTE — Op Note (Signed)
Operative note   Surgeon:Yohanna Tow    Assistant:none    Preop diagnosis:right foot gangrenous changes    Postop diagnosis:same    Procedure:excisional debridement to bone distal lateral right foot.    ZOX:WRUEAVWEBL:minimal    Anesthesia:local and IV sedation    Hemostasis:none    Specimen:none    Complications:none    Operative indications:[name] is an 66 y.o. that presents today for surgical intervention.  The risks/benefits/alternatives/complications have been discussed and consent has been given.    Procedure:  Patient was brought into the OR and placed on the operating table in thesupine position. After anesthesia was obtained theright lower extremity was prepped and draped in usual sterile fashion.  Attention wasdirected to the distal lateral aspect of the midfoot. The wound measured approximately 10 x 9 cm.the initial superficial necrotic skin flap with then excised with a 15 blade down to the subcutaneous tissue. At this time excisional debridement with the versa jet was taken down to bone. The wound measured 10 x 9 cm. Good healthy bleeding of tissue was noted. A combination of mixed subcutaneous and granular tissue was remaining after excisional debridement. Minimal exposed bone was noted at this time. We had discussed amputation of the fourth toe prior to surgery but the fourth toe looked to be viable upon debridement today. At this time I elected to leave the fourth toe for further evaluation down the road. After full thickness excisional debridement to bone was performed I then applied a wound VAC negative pressure dressing to the right foot with a good seal.    Patient tolerated the procedure and anesthesia well.  Was transported from the OR to the PACU with all vital signs stable and vascular status intact. To be discharged per routine protocol.  Will follow up in approximately 1 week in the outpatient clinic.

## 2016-11-07 NOTE — Transfer of Care (Addendum)
Immediate Anesthesia Transfer of Care Note  Patient: [name]  Procedure(s) Performed: Procedure(s): DEBRIDEMENT OF WOUND AND BONE RIGHT FOOT AND APPLY WOUND VAC (Right)  Patient Location: PACU  Anesthesia Type:MAC  Level of Consciousness: awake, alert , oriented and patient cooperative  Airway & Oxygen Therapy: Patient Spontanous Breathing  Post-op Assessment: Report given to RN, Post -op Vital signs reviewed and stable and Patient moving all extremities  Post vital signs: Reviewed and stable  Last Vitals:  Vitals:   11/07/16 0630 11/07/16 0842  BP: 137/87 110/78  Pulse: 97 93  Resp:  18  Temp: 36.3 C 36.7 C    Last Pain:  Vitals:   11/07/16 0625  TempSrc: Tympanic         Complications: No apparent anesthesia complications

## 2016-11-30 ENCOUNTER — Other Ambulatory Visit
Admission: RE | Admit: 2016-11-30 | Discharge: 2016-11-30 | Disposition: A | Discharge status: Home / Self Care | Payer: Medicare HMO | Source: Skilled Nursing Facility | Attending: Family Medicine | Admitting: Family Medicine

## 2016-11-30 DIAGNOSIS — L0390 Cellulitis, unspecified: Secondary | ICD-10-CM | POA: Insufficient documentation

## 2016-11-30 LAB — PROTIME-INR
INR: 1
PROTHROMBIN TIME: 13.2 s (ref 11.4–15.2)

## 2016-12-04 ENCOUNTER — Ambulatory Visit (INDEPENDENT_AMBULATORY_CARE_PROVIDER_SITE_OTHER): Payer: Medicare HMO | Admitting: Vascular Surgery

## 2016-12-04 ENCOUNTER — Encounter (INDEPENDENT_AMBULATORY_CARE_PROVIDER_SITE_OTHER): Payer: Self-pay | Admitting: Vascular Surgery

## 2016-12-04 ENCOUNTER — Ambulatory Visit (INDEPENDENT_AMBULATORY_CARE_PROVIDER_SITE_OTHER): Payer: Medicare HMO

## 2016-12-04 VITALS — BP 149/82 | HR 98 | Resp 16 | Wt 233.0 lb

## 2016-12-04 DIAGNOSIS — E1152 Type 2 diabetes mellitus with diabetic peripheral angiopathy with gangrene: Secondary | ICD-10-CM | POA: Diagnosis not present

## 2016-12-04 DIAGNOSIS — L97514 Non-pressure chronic ulcer of other part of right foot with necrosis of bone: Secondary | ICD-10-CM | POA: Diagnosis not present

## 2016-12-04 DIAGNOSIS — E782 Mixed hyperlipidemia: Secondary | ICD-10-CM

## 2016-12-04 DIAGNOSIS — I7025 Atherosclerosis of native arteries of other extremities with ulceration: Secondary | ICD-10-CM

## 2016-12-04 DIAGNOSIS — Z794 Long term (current) use of insulin: Secondary | ICD-10-CM

## 2016-12-04 NOTE — Progress Notes (Signed)
Subjective:    Patient ID: [name], male    DOB: 04-24-1951, 66 y.o.   MRN: [id number] Chief Complaint  Patient presents with  . Follow-up   Patient last seen on 10/30/16 in clinic. He is s/p a RLE angiogram on 10/10/16 for a RLE ulceration. He presents today for a follow up RLE arterial ultrasound to assess arterial blood flow s/p intervention. Patient states improvement to his wound. His wound is being treated with VAC therapy. He is not weight baring to this RLE. Denies rest pain or worsening ulceration. Denies any fever, nausea or vomiting. The patient underwent a RLE arterial duplex which was notable for triphasic blood flow distally with no hemodynamically significant atherosclerotic disease of the right lower extremity.    Review of Systems  Constitutional: Negative.   HENT: Negative.   Eyes: Negative.   Respiratory: Negative.   Cardiovascular: Negative.   Gastrointestinal: Negative.   Endocrine: Negative.   Genitourinary: Negative.   Musculoskeletal: Negative.   Skin: Positive for wound.  Allergic/Immunologic: Negative.   Neurological: Negative.   Hematological: Negative.   Psychiatric/Behavioral: Negative.       Objective:   Physical Exam  Constitutional: He is oriented to person, place, and time. He appears well-developed and well-nourished. No distress.  HENT:  Head: Normocephalic and atraumatic.  Eyes: Conjunctivae are normal. Pupils are equal, round, and reactive to light.  Neck: Normal range of motion.  Cardiovascular: Normal rate, regular rhythm and normal heart sounds.   Pulses:      Radial pulses are 2+ on the right side, and 2+ on the left side.       Dorsalis pedis pulses are 2+ on the left side.       Posterior tibial pulses are 2+ on the left side.  Right Foot: In VAC dressing.   Pulmonary/Chest: Effort normal.  Musculoskeletal: Normal range of motion. He exhibits no edema.  Neurological: He is alert and oriented to person, place, and time.    Skin: He is not diaphoretic.  Right Foot: In VAC dressing. No foul odor. No surrounding erythema. No cellulitis.   Psychiatric: He has a normal mood and affect. His behavior is normal. Judgment and thought content normal.  Vitals reviewed.  BP (!) 149/82   Pulse 98   Resp 16   Wt 233 lb (105.7 kg)   BMI 31.60 kg/m   Past Medical History:  Diagnosis Date  . Arthritis   . Bladder incontinence   . Chronic kidney disease    Stage 3 Kidney disease  . Dementia   . Diabetes mellitus without complication (HCC)   . Dyspnea    occassional  . Hyperlipidemia   . Hypertension   . Peripheral vascular disease (HCC)   . Sepsis [facility])    Social History   Social History  . Marital status: Single    Spouse name: N/A  . Number of children: N/A  . Years of education: N/A   Occupational History  . Not on file.   Social History Main Topics  . Smoking status: Former Smoker    Types: Pipe  . Smokeless tobacco: Never Used  . Alcohol use No  . Drug use: No  . Sexual activity: Not on file   Other Topics Concern  . Not on file   Social History Narrative  . No narrative on file   Past Surgical History:  Procedure Laterality Date  . ABDOMINAL AORTOGRAM W/LOWER EXTREMITY N/A 10/10/2016   Procedure: Abdominal Aortogram w/Lower  Extremity;  Surgeon: Renford Dills, MD;  Location: [facility] INVASIVE CV LAB;  Service: Cardiovascular;  Laterality: N/A;  . EYE SURGERY Left    Retina Detachment  . IRRIGATION AND DEBRIDEMENT FOOT Right 10/03/2016   Procedure: IRRIGATION AND DEBRIDEMENT FOOT;  Surgeon: Gwyneth Revels, DPM;  Location: [facility] ORS;  Service: Podiatry;  Laterality: Right;  . LOWER EXTREMITY INTERVENTION  10/10/2016   Procedure: Lower Extremity Intervention;  Surgeon: Renford Dills, MD;  Location: [facility] INVASIVE CV LAB;  Service: Cardiovascular;;  . PERIPHERAL VASCULAR BALLOON ANGIOPLASTY Left 10/10/2016   Procedure: Peripheral Vascular Balloon Angioplasty;  Surgeon: Renford Dills, MD;   Location: [facility] INVASIVE CV LAB;  Service: Cardiovascular;  Laterality: Left;  . TONSILLECTOMY    . WOUND DEBRIDEMENT Right 11/07/2016   Procedure: DEBRIDEMENT OF WOUND AND BONE RIGHT FOOT AND APPLY WOUND VAC;  Surgeon: Gwyneth Revels, DPM;  Location: [facility] ORS;  Service: Podiatry;  Laterality: Right;   Family History  Problem Relation Age of Onset  . Diabetes Mother   . Hypertension Mother   . Diabetes Father   . Hypertension Father     Allergies  Allergen Reactions  . Sulfa Antibiotics Rash  . Zosyn [Piperacillin Sod-Tazobactam So] Rash      Assessment & Plan:  Patient last seen on 10/30/16 in clinic. He is s/p a RLE angiogram on 10/10/16 for a RLE ulceration. He presents today for a follow up RLE arterial ultrasound to assess arterial blood flow s/p intervention. Patient states improvement to his wound. His wound is being treated with VAC therapy. He is not weight baring to this RLE. Denies rest pain or worsening ulceration. Denies any fever, nausea or vomiting. The patient underwent a RLE arterial duplex which was notable for triphasic blood flow distally with no hemodynamically significant atherosclerotic disease of the right lower extremity.   1. 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 Patient reports improvement in wound. Patient to follow up in three months.  - VAS [place] LOWER EXTREMITY ARTERIAL DUPLEX; Future  2. Mixed hyperlipidemia - Stable Encouraged good control as its slows the progression of atherosclerotic disease  3. Type 2 diabetes mellitus with diabetic peripheral angiopathy and gangrene, with long-term current use of insulin (HCC) - Stable Encouraged good control as its slows the progression of atherosclerotic disease  Current Outpatient Prescriptions on File Prior to Visit  Medication Sig Dispense Refill  . aspirin EC 81 MG tablet Take 81 mg by mouth daily.    Marland Kitchen atorvastatin (LIPITOR) 20 MG tablet  Take 20 mg by mouth daily.    . ciprofloxacin (CIPRO) 500 MG tablet Take 1 tablet (500 mg total) by mouth 2 (two) times daily. X 4 weeks 60 tablet 0  . clindamycin (CLEOCIN) 300 MG capsule Take 1 capsule (300 mg total) by mouth every 8 (eight) hours. X 4 weeks 100 capsule 0  . docusate sodium (COLACE) 100 MG capsule Take 1 capsule (100 mg total) by mouth 2 (two) times daily. 10 capsule 0  . insulin glargine (LANTUS) 100 UNIT/ML injection Inject 0.15 mLs (15 Units total) into the skin at bedtime. 10 mL 11  . insulin lispro (HUMALOG) 100 UNIT/ML injection Inject 8 Units into the skin 3 (three) times daily before meals.    . metFORMIN (GLUCOPHAGE) 1000 MG tablet Take 1,000 mg by mouth 2 (two) times daily with a meal.    . Multiple Vitamins-Minerals (CENTROVITE) TABS Take 1 tablet  by mouth daily.    Marland Kitchen oxyCODONE (OXY IR/ROXICODONE) 5 MG immediate release tablet Take 1 tablet (5 mg total) by mouth every 4 (four) hours as needed for moderate pain. 20 tablet 0   No current facility-administered medications on file prior to visit.     There are no Patient Instructions on file for this visit. No Follow-up on file.   Aengus Sauceda A Isela Stantz, PA-C

## 2017-01-27 ENCOUNTER — Other Ambulatory Visit
Admission: RE | Admit: 2017-01-27 | Discharge: 2017-01-27 | Disposition: A | Discharge status: Home / Self Care | Payer: Medicare HMO | Source: Skilled Nursing Facility | Attending: Internal Medicine | Admitting: Internal Medicine

## 2017-01-27 DIAGNOSIS — E1122 Type 2 diabetes mellitus with diabetic chronic kidney disease: Secondary | ICD-10-CM | POA: Diagnosis present

## 2017-01-27 DIAGNOSIS — M79671 Pain in right foot: Secondary | ICD-10-CM | POA: Diagnosis not present

## 2017-01-27 DIAGNOSIS — A419 Sepsis, unspecified organism: Secondary | ICD-10-CM | POA: Diagnosis not present

## 2017-01-27 DIAGNOSIS — I10 Essential (primary) hypertension: Secondary | ICD-10-CM | POA: Diagnosis not present

## 2017-01-27 DIAGNOSIS — I739 Peripheral vascular disease, unspecified: Secondary | ICD-10-CM | POA: Insufficient documentation

## 2017-01-27 DIAGNOSIS — K5900 Constipation, unspecified: Secondary | ICD-10-CM | POA: Diagnosis not present

## 2017-01-27 DIAGNOSIS — X58XXXS Exposure to other specified factors, sequela: Secondary | ICD-10-CM | POA: Diagnosis not present

## 2017-01-27 DIAGNOSIS — I82409 Acute embolism and thrombosis of unspecified deep veins of unspecified lower extremity: Secondary | ICD-10-CM | POA: Insufficient documentation

## 2017-01-27 DIAGNOSIS — S91301S Unspecified open wound, right foot, sequela: Secondary | ICD-10-CM | POA: Insufficient documentation

## 2017-01-27 DIAGNOSIS — R296 Repeated falls: Secondary | ICD-10-CM | POA: Diagnosis not present

## 2017-01-27 DIAGNOSIS — N183 Chronic kidney disease, stage 3 (moderate): Secondary | ICD-10-CM | POA: Diagnosis present

## 2017-01-27 DIAGNOSIS — Z4801 Encounter for change or removal of surgical wound dressing: Secondary | ICD-10-CM | POA: Diagnosis present

## 2017-01-27 DIAGNOSIS — M6281 Muscle weakness (generalized): Secondary | ICD-10-CM | POA: Insufficient documentation

## 2017-01-27 DIAGNOSIS — E785 Hyperlipidemia, unspecified: Secondary | ICD-10-CM | POA: Insufficient documentation

## 2017-02-02 LAB — ANAEROBIC CULTURE

## 2017-02-05 ENCOUNTER — Ambulatory Visit (INDEPENDENT_AMBULATORY_CARE_PROVIDER_SITE_OTHER): Payer: Medicare HMO | Admitting: Vascular Surgery

## 2017-02-05 ENCOUNTER — Ambulatory Visit (INDEPENDENT_AMBULATORY_CARE_PROVIDER_SITE_OTHER): Payer: Medicare HMO

## 2017-02-05 VITALS — BP 129/76 | HR 91 | Resp 16 | Ht 71.0 in | Wt 240.0 lb

## 2017-02-05 DIAGNOSIS — E782 Mixed hyperlipidemia: Secondary | ICD-10-CM | POA: Diagnosis not present

## 2017-02-05 DIAGNOSIS — L97514 Non-pressure chronic ulcer of other part of right foot with necrosis of bone: Secondary | ICD-10-CM | POA: Diagnosis not present

## 2017-02-05 DIAGNOSIS — I7025 Atherosclerosis of native arteries of other extremities with ulceration: Secondary | ICD-10-CM | POA: Diagnosis not present

## 2017-02-05 DIAGNOSIS — Z794 Long term (current) use of insulin: Secondary | ICD-10-CM | POA: Diagnosis not present

## 2017-02-05 DIAGNOSIS — E1152 Type 2 diabetes mellitus with diabetic peripheral angiopathy with gangrene: Secondary | ICD-10-CM

## 2017-02-05 LAB — VAS US LOWER EXTREMITY ARTERIAL DUPLEX
RIGHT ANT DIST TIBAL DIA PSV: 12 cm/s
RIGHT ANT DIST TIBAL SYS PSV: 96 cm/s
RIGHT POPLITEAL PROX EDV: 0 cm/s
RIGHT POST TIB DIST DIA: 7 cm/s
RIGHT POST TIB DIST SYS: 145 cm/s
RIGHT SUPER FEMORAL MID EDV: 0 cm/s
RIGHT SUPER FEMORAL PROX EDV: 0 cm/s
RSFDPSV: -56 cm/s
RSFMPSV: -101 cm/s
RTSFADISTDIA: 0 cm/s
Right popliteal prox sys PSV: 67 cm/s
Right super femoral prox sys PSV: -79 cm/s

## 2017-02-05 NOTE — Progress Notes (Signed)
MRN : [id number]  [name] is a 66 y.o. (30-Sep-1950) male who presents with chief complaint of  Chief Complaint  Patient presents with  . Re-evaluation    2 month follow up  .  History of Present Illness: The patient returns to the office for followup and review status post angiogram with intervention. He is s/p a RLE angiogram on 10/10/16 for a RLE ulceration. The patient notes improvement in the lower extremity symptoms. No interval shortening of the patient's claudication distance or rest pain symptoms. Previous wounds have now healed.  No new ulcers or wounds have occurred since the last visit.  There have been no significant changes to the patient's overall health care.  The patient denies amaurosis fugax or recent TIA symptoms. There are no recent neurological changes noted. The patient denies history of DVT, PE or superficial thrombophlebitis. The patient denies recent episodes of angina or shortness of breath.   Duplex US of the right lower extremity arterial system shows patent intervention  Current Meds  Medication Sig  . aspirin EC 81 MG tablet Take 81 mg by mouth daily.  Marland Kitchen atorvastatin (LIPITOR) 20 MG tablet Take 20 mg by mouth daily.  . ciprofloxacin (CIPRO) 500 MG tablet Take 1 tablet (500 mg total) by mouth 2 (two) times daily. X 4 weeks  . docusate sodium (COLACE) 100 MG capsule Take 1 capsule (100 mg total) by mouth 2 (two) times daily.  . insulin glargine (LANTUS) 100 UNIT/ML injection Inject 0.15 mLs (15 Units total) into the skin at bedtime.  . insulin lispro (HUMALOG) 100 UNIT/ML injection Inject 8 Units into the skin 3 (three) times daily before meals.  . metFORMIN (GLUCOPHAGE) 1000 MG tablet Take 1,000 mg by mouth 2 (two) times daily with a meal.  . Multiple Vitamins-Minerals (CENTROVITE) TABS Take 1 tablet by mouth daily.  Marland Kitchen oxyCODONE (OXY IR/ROXICODONE) 5 MG immediate release tablet Take 1 tablet (5 mg total) by mouth every 4 (four) hours as needed  for moderate pain.  Marland Kitchen warfarin (COUMADIN) 7.5 MG tablet Take 7.5 mg by mouth daily.    Past Medical History:  Diagnosis Date  . Arthritis   . Bladder incontinence   . Chronic kidney disease    Stage 3 Kidney disease  . Dementia   . Diabetes mellitus without complication (HCC)   . Dyspnea    occassional  . Hyperlipidemia   . Hypertension   . Peripheral vascular disease (HCC)   . Sepsis [facility])     Past Surgical History:  Procedure Laterality Date  . ABDOMINAL AORTOGRAM W/LOWER EXTREMITY N/A 10/10/2016   Procedure: Abdominal Aortogram w/Lower Extremity;  Surgeon: Renford Dills, MD;  Location: [facility] INVASIVE CV LAB;  Service: Cardiovascular;  Laterality: N/A;  . EYE SURGERY Left    Retina Detachment  . IRRIGATION AND DEBRIDEMENT FOOT Right 10/03/2016   Procedure: IRRIGATION AND DEBRIDEMENT FOOT;  Surgeon: Gwyneth Revels, DPM;  Location: [facility] ORS;  Service: Podiatry;  Laterality: Right;  . LOWER EXTREMITY INTERVENTION  10/10/2016   Procedure: Lower Extremity Intervention;  Surgeon: Renford Dills, MD;  Location: [facility] INVASIVE CV LAB;  Service: Cardiovascular;;  . PERIPHERAL VASCULAR BALLOON ANGIOPLASTY Left 10/10/2016   Procedure: Peripheral Vascular Balloon Angioplasty;  Surgeon: Renford Dills, MD;  Location: [facility] INVASIVE CV LAB;  Service: Cardiovascular;  Laterality: Left;  . TONSILLECTOMY    . WOUND DEBRIDEMENT Right 11/07/2016   Procedure: DEBRIDEMENT OF WOUND AND BONE RIGHT FOOT AND APPLY WOUND VAC;  Surgeon:  Gwyneth Revels, DPM;  Location: [facility] ORS;  Service: Podiatry;  Laterality: Right;    Social History Social History  Substance Use Topics  . Smoking status: Former Smoker    Types: Pipe  . Smokeless tobacco: Never Used  . Alcohol use No    Family History Family History  Problem Relation Age of Onset  . Diabetes Mother   . Hypertension Mother   . Diabetes Father   . Hypertension Father     Allergies  Allergen Reactions  . Sulfa Antibiotics Rash  . Zosyn  [Piperacillin Sod-Tazobactam So] Rash     REVIEW OF SYSTEMS (Negative unless checked)  Constitutional: [] Weight loss  [] Fever  [] Chills Cardiac: [] Chest pain   [] Chest pressure   [] Palpitations   [] Shortness of breath when laying flat   [] Shortness of breath with exertion. Vascular:  [] Pain in legs with walking   [] Pain in legs at rest  [] History of DVT   [] Phlebitis   [x] Swelling in legs   [] Varicose veins   [x] Non-healing ulcers Pulmonary:   [] Uses home oxygen   [] Productive cough   [] Hemoptysis   [] Wheeze  [] COPD   [] Asthma Neurologic:  [] Dizziness   [] Seizures   [] History of stroke   [] History of TIA  [] Aphasia   [] Vissual changes   [] Weakness or numbness in arm   [] Weakness or numbness in leg Musculoskeletal:   [] Joint swelling   [] Joint pain   [] Low back pain Hematologic:  [] Easy bruising  [] Easy bleeding   [] Hypercoagulable state   [] Anemic Gastrointestinal:  [] Diarrhea   [] Vomiting  [] Gastroesophageal reflux/heartburn   [] Difficulty swallowing. Genitourinary:  [] Chronic kidney disease   [] Difficult urination  [] Frequent urination   [] Blood in urine Skin:  [] Rashes   [] Ulcers  Psychological:  [] History of anxiety   []  History of major depression.  Physical Examination  Vitals:   02/05/17 0943  BP: 129/76  Pulse: 91  Resp: 16  Weight: 240 lb (108.9 kg)  Height: 5\' 11"  (1.803 m)   Body mass index is 33.47 kg/m. Gen: WD/WN, NAD Head: [place]/AT, No temporalis wasting.  Ear/Nose/Throat: Hearing grossly intact, nares w/o erythema or drainage Eyes: PER, EOMI, sclera nonicteric.  Neck: Supple, no large masses.   Pulmonary:  Good air movement, no audible wheezing bilaterally, no use of accessory muscles.  Cardiac: RRR, no JVD Vascular: right foot ulcer is granulating but remains quite larger Vessel Right Left  Radial Palpable Palpable  Ulnar Palpable Palpable  Brachial Palpable Palpable  Carotid Palpable Palpable  Femoral Palpable Palpable  Popliteal Palpable Palpable  PT Not  Palpable Not Palpable  DP Not Palpable Not Palpable  Gastrointestinal: Non-distended. No guarding/no peritoneal signs.  Musculoskeletal: M/S 5/5 throughout arms he presents in a wheelchair.  No deformity or atrophy.  Neurologic: CN 2-12 intact. Symmetrical.  Speech is fluent. Motor exam as listed above. Psychiatric: Judgment intact, Mood & affect appropriate for pt's clinical situation. Dermatologic: ulcers as noted.  No changes consistent with cellulitis. Lymph : No lichenification or skin changes of chronic lymphedema.  CBC Lab Results  Component Value Date   WBC 7.4 11/03/2016   HGB 10.8 (L) 11/03/2016   HCT 32.8 (L) 11/03/2016   MCV 84.6 11/03/2016   PLT 552 (H) 11/03/2016    BMET    Component Value Date/Time   NA 138 11/03/2016 1209   NA 140 04/04/2013 1238   K 4.6 11/03/2016 1209   K 4.1 04/04/2013 1238   CL 103 11/03/2016 1209   CL 108 (H) 04/04/2013 1238  CO2 25 11/03/2016 1209   CO2 30 04/04/2013 1238   GLUCOSE 108 (H) 11/03/2016 1209   GLUCOSE 98 04/04/2013 1238   BUN 14 11/03/2016 1209   BUN 19 (H) 04/04/2013 1238   CREATININE 0.91 11/03/2016 1209   CREATININE 0.87 04/04/2013 1238   CALCIUM 9.3 11/03/2016 1209   CALCIUM 8.8 04/04/2013 1238   GFRNONAA >60 11/03/2016 1209   GFRNONAA >60 04/04/2013 1238   GFRAA >60 11/03/2016 1209   GFRAA >60 04/04/2013 1238   CrCl cannot be calculated (Patient's most recent lab result is older than the maximum 21 days allowed.).  COAG Lab Results  Component Value Date   INR 1.00 11/30/2016    Radiology No results found.  Assessment/Plan 1. Atherosclerosis of native arteries of the extremities with ulceration (HCC) Will switch to Aquacel Ag+ on a daily basis  Follow up in 1-2 months no studies  Will repeat ultrasound in 3 months or sooner if the would worsens  2. Type 2 diabetes mellitus with diabetic peripheral angiopathy and gangrene, with long-term current use of insulin (HCC) Continue hypoglycemic medications  as already ordered, these medications have been reviewed and there are no changes at this time.  Hgb A1C to be monitored as already arranged by primary service   3. 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 Mixed hyperlipidemia Continue statin as ordered and reviewed, no changes at this time     Levora DredgeGregory Inaaya Vellucci, MD  02/05/2017 10:24 AM

## 2017-05-13 ENCOUNTER — Ambulatory Visit (INDEPENDENT_AMBULATORY_CARE_PROVIDER_SITE_OTHER): Payer: Medicare HMO | Admitting: Vascular Surgery

## 2017-05-13 ENCOUNTER — Encounter (INDEPENDENT_AMBULATORY_CARE_PROVIDER_SITE_OTHER): Payer: Self-pay | Admitting: Vascular Surgery

## 2017-05-13 ENCOUNTER — Ambulatory Visit (INDEPENDENT_AMBULATORY_CARE_PROVIDER_SITE_OTHER): Payer: Medicare HMO

## 2017-05-13 VITALS — BP 134/78 | HR 78 | Resp 16 | Wt 265.8 lb

## 2017-05-13 DIAGNOSIS — I7025 Atherosclerosis of native arteries of other extremities with ulceration: Secondary | ICD-10-CM

## 2017-05-13 DIAGNOSIS — E1152 Type 2 diabetes mellitus with diabetic peripheral angiopathy with gangrene: Secondary | ICD-10-CM

## 2017-05-13 DIAGNOSIS — Z794 Long term (current) use of insulin: Secondary | ICD-10-CM | POA: Diagnosis not present

## 2017-05-13 DIAGNOSIS — L97514 Non-pressure chronic ulcer of other part of right foot with necrosis of bone: Secondary | ICD-10-CM

## 2017-05-13 NOTE — Progress Notes (Signed)
MRN : [id number]   [name] is a 66 y.o. (1951-03-11) male who presents with chief complaint of  Chief Complaint  Patient presents with  . Follow-up    47mo abi  .  History of Present Illness:   Patient is here today for f/u ABIs and as a referral request from Dr. Ether GriffinsFowler for consideration of a split thickness skin graft to a foot ulcer present to the right lateral foot. His wound appears to be a complicated diabetic ulcer. 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 The foot ulcer was first noted on 10/27/2016 when he was [facility] for RLE cellulitis and gangrene of the right fifth toe. He had a subsequent amputation  Of the fifth toe and has had complications that have resulted in delayed wound healing since.   He is followed and cared for by Dr. Ether GriffinsFowler for this wound. Dr. Ether GriffinsFowler has requested his insurance cover an Apligraf for wound therapy, which has been denied x 2. Patient has had a range of wound care methods from a wound vac, debridement, etc. and today he presents with a nonadherent dressing secured with Kerlex in place. His ABI today revealed: No evidence of significant right lower extremity arterial disease and an ABI of 1.31. His left ABI was unattainable d/t non-compressible vessels most likely present as a result of medial calcifications. He had bilateral normal toe-brachial indices. No previous ABIs in system for review.  Patient also voices some financial concerns that may require [facility] to [facility] before [facility] is medically released.   Current Meds  Medication Sig  . atorvastatin (LIPITOR) 20 MG tablet Take 20 mg by mouth daily.  . insulin glargine (LANTUS) 100 UNIT/ML injection Inject 0.15 mLs (15 Units total) into the skin at bedtime.  . insulin lispro (HUMALOG) 100 UNIT/ML injection Inject 8 Units into the skin 3 (three) times daily before meals.  . metFORMIN (GLUCOPHAGE) 1000 MG tablet Take 1,000 mg by mouth 2 (two) times daily with a  meal.  . Multiple Vitamins-Minerals (CENTROVITE) TABS Take 1 tablet by mouth daily.  Marland Kitchen. oxyCODONE (OXY IR/ROXICODONE) 5 MG immediate release tablet Take 1 tablet (5 mg total) by mouth every 4 (four) hours as needed for moderate pain.  . polyethylene glycol (MIRALAX / GLYCOLAX) packet Take 17 g by mouth daily.  . rivaroxaban (XARELTO) 20 MG TABS tablet Take 20 mg by mouth daily with supper.  . senna (SENOKOT) 8.6 MG TABS tablet Take 1 tablet by mouth 2 (two) times daily.    Past Medical History:  Diagnosis Date  . Arthritis   . Bladder incontinence   . Chronic kidney disease    Stage 3 Kidney disease  . Dementia   . Diabetes mellitus without complication (HCC)   . Dyspnea    occassional  . Hyperlipidemia   . Hypertension   . Peripheral vascular disease (HCC)   . Sepsis [facility](HCC)     Past Surgical History:  Procedure Laterality Date  . ABDOMINAL AORTOGRAM W/LOWER EXTREMITY N/A 10/10/2016   Procedure: Abdominal Aortogram w/Lower Extremity;  Surgeon: Renford DillsGregory G Schnier, MD;  Location: [facility] INVASIVE CV LAB;  Service: Cardiovascular;  Laterality: N/A;  . EYE SURGERY Left    Retina Detachment  . IRRIGATION AND DEBRIDEMENT FOOT Right 10/03/2016   Procedure: IRRIGATION AND DEBRIDEMENT FOOT;  Surgeon: Gwyneth RevelsJustin Fowler, DPM;  Location: [facility] ORS;  Service: Podiatry;  Laterality: Right;  . LOWER EXTREMITY INTERVENTION  10/10/2016  Procedure: Lower Extremity Intervention;  Surgeon: Renford Dills, MD;  Location: [facility] INVASIVE CV LAB;  Service: Cardiovascular;;  . PERIPHERAL VASCULAR BALLOON ANGIOPLASTY Left 10/10/2016   Procedure: Peripheral Vascular Balloon Angioplasty;  Surgeon: Renford Dills, MD;  Location: [facility] INVASIVE CV LAB;  Service: Cardiovascular;  Laterality: Left;  . TONSILLECTOMY    . WOUND DEBRIDEMENT Right 11/07/2016   Procedure: DEBRIDEMENT OF WOUND AND BONE RIGHT FOOT AND APPLY WOUND VAC;  Surgeon: Gwyneth Revels, DPM;  Location: [facility] ORS;  Service: Podiatry;  Laterality: Right;     Social History Social History  Substance Use Topics  . Smoking status: Former Smoker    Types: Pipe  . Smokeless tobacco: Never Used  . Alcohol use No    Family History Family History  Problem Relation Age of Onset  . Diabetes Mother   . Hypertension Mother   . Diabetes Father   . Hypertension Father     Allergies  Allergen Reactions  . Sulfa Antibiotics Rash  . Zosyn [Piperacillin Sod-Tazobactam So] Rash     REVIEW OF SYSTEMS (Negative unless checked)  Constitutional: Weight loss  Fever  Chills Cardiac: Chest pain   Chest pressure   Palpitations   Shortness of breath when laying flat   Shortness of breath with exertion. Vascular:  Pain in legs with walking   Pain in legs at rest  History of DVT   Phlebitis   Swelling in legs   Varicose veins   Non-healing ulcers Pulmonary:   Uses home oxygen   Productive cough   Hemoptysis   Wheeze  COPD   Asthma Neurologic:  Dizziness   Seizures   History of stroke   History of TIA  Aphasia   Facial droop  Visual changes   Weakness or numbness in arm   Weakness or numbness in leg Musculoskeletal:   Joint swelling   Joint pain   Low back pain Hematologic:  Easy bruising  Easy bleeding   Hypercoagulable state   Anemic Gastrointestinal:  Diarrhea   Vomiting   Abdominal pain  Difficulty swallowing.   Blood in stool Genitourinary:  Chronic kidney disease   Difficult urination  Frequent urination   Blood in urine Skin:  Rashes   Ulcers  Psychological:  History of anxiety    History of major depression.  Physical Examination  Vitals:   05/13/17 1023  BP: 134/78  Pulse: 78  Resp: 16  Weight: 265 lb 12.8 oz (120.6 kg)   Body mass index is 37.07 kg/m. Gen: WD/WN, NAD Head: [place]/AT, No temporalis wasting.  Ear/Nose/Throat: Hearing grossly intact, nares w/o erythema or drainage Eyes: PER, EOMI, sclera nonicteric.  Pulmonary:  Good air  movement, no use of accessory muscles.  Cardiac: RRR, no JVD Vascular:  Vessel Right Left  Radial +2 Palpable +2 Palpable  Ulnar    Brachial    Carotid    Femoral    Popliteal    PT +1 Palpable +1 Palpable  DP +2 Palpable +2 Palpable  Minimal lower extremity swelling noted Neurologic: Alert and oriented x 3. Facial movements symmetrical.  Speech is fluent.  Psychiatric: Judgment intact, Mood & affect appropriate for pt's clinical situation. Dermatologic: Ulcer present to right lateral foot. Estimates 6.5 x 4.5 cm. There appears to be a good granular tissue base. Some yellow eschar present over the edges of the wound. No s/s of cellulitis at this time.   CBC Lab Results  Component Value Date   WBC 7.4 11/03/2016   HGB  10.8 (L) 11/03/2016   HCT 32.8 (L) 11/03/2016   MCV 84.6 11/03/2016   PLT 552 (H) 11/03/2016    BMET    Component Value Date/Time   NA 138 11/03/2016 1209   NA 140 04/04/2013 1238   K 4.6 11/03/2016 1209   K 4.1 04/04/2013 1238   CL 103 11/03/2016 1209   CL 108 (H) 04/04/2013 1238   CO2 25 11/03/2016 1209   CO2 30 04/04/2013 1238   GLUCOSE 108 (H) 11/03/2016 1209   GLUCOSE 98 04/04/2013 1238   BUN 14 11/03/2016 1209   BUN 19 (H) 04/04/2013 1238   CREATININE 0.91 11/03/2016 1209   CREATININE 0.87 04/04/2013 1238   CALCIUM 9.3 11/03/2016 1209   CALCIUM 8.8 04/04/2013 1238   GFRNONAA >60 11/03/2016 1209   GFRNONAA >60 04/04/2013 1238   GFRAA >60 11/03/2016 1209   GFRAA >60 04/04/2013 1238   CrCl cannot be calculated (Patient's most recent lab result is older than the maximum 21 days allowed.).  COAG Lab Results  Component Value Date   INR 1.00 11/30/2016    Radiology  ABI obtained today. See result under media tab for date 05/13/2017. These results have been reviewed by myself and Dr. [name].   Assessment/Plan  1. Diabetic foot ulcer, right   ABI's completed today as well as his physical exam demonstrated that he has adequate arterial  blood flow. It appears that diabetes may be the main complicating factor to this wound. In light of the current non-healing state and incomplete resolution with a wide variety of interventions, I agree with Dr. Earney Mallet finding that the best method of treatment at this time would be either Apligraf &/or a split thickness skin graft. I will discuss this patient case and current scenario with Dr. [name] to determine the next steps in the process.   2. T2DM, insulin dependent  Continue anti-hyperglycemic medications as currently prescribed, these medications have been reviewed and there are no changes at this time. Hgb A1C to be monitored as previously arranged primary care.   3. Atherosclerosis of native arteries of the extremities with ulceration (HCC)  Recommend:  ABI performed which revealed: No evidence of significant right lower extremity arterial disease and an ABI of 1.31. His left ABI was unattainable d/t non-compressible vessels most likely present as a result of medial calcifications. He had bilateral normal toe-brachial indices. No previous ABIs in system for review. At this time   No invasive studies, angiography or surgery at this time The patient should continue to have antiplatelet therapy and aggressive management of comorbid conditions  No changes in the patient's medications at this time  -Red flags and when to present for emergency care or RTC including fever >101.80F, chest pain, shortness of breath, new/worsening/un-resolving symptoms, S/S of cellulitis or infection of wound reviewed with patient at time of visit. Follow up and care instructions discussed and patient verbalized understanding.  I have discussed this patient with Dr. [name] who agrees with this plan of care.     Clarene Duke, NP  05/13/2017 11:12 AM

## 2017-05-13 NOTE — Patient Instructions (Signed)

## 2017-05-14 ENCOUNTER — Encounter (INDEPENDENT_AMBULATORY_CARE_PROVIDER_SITE_OTHER): Payer: Self-pay

## 2017-05-15 ENCOUNTER — Other Ambulatory Visit (INDEPENDENT_AMBULATORY_CARE_PROVIDER_SITE_OTHER): Payer: Self-pay | Admitting: Vascular Surgery

## 2017-05-22 ENCOUNTER — Encounter
Admission: RE | Admit: 2017-05-22 | Discharge: 2017-05-22 | Disposition: A | Discharge status: Home / Self Care | Payer: Medicare HMO | Source: Ambulatory Visit | Attending: Vascular Surgery | Admitting: Vascular Surgery

## 2017-05-22 ENCOUNTER — Encounter (INDEPENDENT_AMBULATORY_CARE_PROVIDER_SITE_OTHER): Payer: Self-pay

## 2017-05-22 DIAGNOSIS — Z01818 Encounter for other preprocedural examination: Secondary | ICD-10-CM | POA: Diagnosis present

## 2017-05-22 DIAGNOSIS — L98499 Non-pressure chronic ulcer of skin of other sites with unspecified severity: Secondary | ICD-10-CM | POA: Insufficient documentation

## 2017-05-22 DIAGNOSIS — I491 Atrial premature depolarization: Secondary | ICD-10-CM | POA: Insufficient documentation

## 2017-05-22 DIAGNOSIS — E11622 Type 2 diabetes mellitus with other skin ulcer: Secondary | ICD-10-CM | POA: Insufficient documentation

## 2017-05-22 DIAGNOSIS — Z0183 Encounter for blood typing: Secondary | ICD-10-CM | POA: Insufficient documentation

## 2017-05-22 DIAGNOSIS — Z01812 Encounter for preprocedural laboratory examination: Secondary | ICD-10-CM | POA: Insufficient documentation

## 2017-05-22 LAB — BASIC METABOLIC PANEL
ANION GAP: 7 (ref 5–15)
BUN: 13 mg/dL (ref 6–20)
CHLORIDE: 104 mmol/L (ref 101–111)
CO2: 28 mmol/L (ref 22–32)
Calcium: 9.3 mg/dL (ref 8.9–10.3)
Creatinine, Ser: 0.91 mg/dL (ref 0.61–1.24)
GFR calc Af Amer: >60 mL/min (ref >60)
GLUCOSE: 90 mg/dL (ref 65–99)
POTASSIUM: 4.4 mmol/L (ref 3.5–5.1)
Sodium: 139 mmol/L (ref 135–145)

## 2017-05-22 LAB — CBC WITH DIFFERENTIAL/PLATELET
BASOS ABS: 0 10*3/uL (ref 0–0.1)
Basophils Relative: 1 %
Eosinophils Absolute: 0.2 10*3/uL (ref 0–0.7)
Eosinophils Relative: 4 %
HEMATOCRIT: 39.8 % — AB (ref 40.0–52.0)
Hemoglobin: 13.4 g/dL (ref 13.0–18.0)
LYMPHS PCT: 27 %
Lymphs Abs: 1.4 10*3/uL (ref 1.0–3.6)
MCH: 28 pg (ref 26.0–34.0)
MCHC: 33.6 g/dL (ref 32.0–36.0)
MCV: 83.3 fL (ref 80.0–100.0)
MONO ABS: 0.4 10*3/uL (ref 0.2–1.0)
Monocytes Relative: 8 %
NEUTROS ABS: 3.2 10*3/uL (ref 1.4–6.5)
Neutrophils Relative %: 60 %
Platelets: 266 10*3/uL (ref 150–440)
RBC: 4.78 MIL/uL (ref 4.40–5.90)
RDW: 17.2 % — ABNORMAL HIGH (ref 11.5–14.5)
WBC: 5.3 10*3/uL (ref 3.8–10.6)

## 2017-05-22 LAB — PROTIME-INR
INR: 2.28
Prothrombin Time: 24.9 seconds — ABNORMAL HIGH (ref 11.4–15.2)

## 2017-05-22 LAB — TYPE AND SCREEN
ABO/RH(D): AB POS
ANTIBODY SCREEN: NEGATIVE

## 2017-05-22 LAB — APTT: APTT: 42 s — AB (ref 24–36)

## 2017-05-22 NOTE — Pre-Procedure Instructions (Signed)
Abnormal PT<PTT faxed to Dr. Marijean Heath office for review.

## 2017-05-22 NOTE — Patient Instructions (Signed)
  Your procedure is scheduled [id number] Sept. 28 , 2018. Report to Same Day Surgery. To find out your arrival time please call [phone] between 1PM - 3PM on Wednesday Sept. 27,2018.  Remember: Instructions that are not followed completely may result in serious medical risk, up to and including death, or upon the discretion of your surgeon and anesthesiologist your surgery may need to be rescheduled.    _x___ 1. Do not eat food after midnight night prior to surgery. No gum chewing or hard candies.    May drink the following: water up until 2 hours prior to arrival time.     ____ 2. No Alcohol for 24 hours before or after surgery.   ____ 3. Bring all medications with you on the day of surgery if instructed.    __x__ 4. Notify your doctor if there is any change in your medical condition     (cold, fever, infections).    _____ 5. No smoking 24 hours prior to surgery.     Do not wear jewelry, make-up, hairpins, clips or nail polish.  Do not wear lotions, powders, or perfumes.   Do not shave 48 hours prior to surgery. Men may shave face and neck.  Do not bring valuables to the hospital.    [facility] is not responsible for any belongings or valuables.               Contacts, dentures or bridgework may not be worn into surgery.  Leave your suitcase in the car. After surgery it may be brought to your room.  For patients admitted to the hospital, discharge time is determined by your treatment team.   Patients discharged the day of surgery will not be allowed to drive home.    Please read over the following fact sheets that you were given:   [facility] Preparing for Surgery  _x___ Take these medicines the morning of surgery with A SIP OF WATER:    1. oxyCODONE (OXY IR/ROXICODONE) optional  2. atorvastatin (LIPITOR)   ____ Fleet Enema (as directed)   _x___ Use CHG Soap as directed on instruction sheet  ____ Use inhalers on the day of surgery and bring to hospital day of  surgery  _x__ Stop metformin 2 days prior to surgery    _x___ Take 1/2 of usual Lantus insulin dose the night before surgery and none on the morning of surgery.   _x___ Stop Xarelto 3 days prior to surgery.  _x___ Stop Anti-inflammatories such as Advil, Aleve, Ibuprofen, Motrin, Naproxen, Naprosyn, Goodies powders or aspirin products. OK to take Tylenol.   ____ Stop supplements until after surgery.    ____ Bring C-Pap to the hospital.

## 2017-05-28 MED ORDER — VANCOMYCIN HCL IN DEXTROSE 1-5 GM/200ML-% IV SOLN
1000.0000 mg | INTRAVENOUS | Status: AC
  Administered 2017-05-29: 1000 mg via INTRAVENOUS

## 2017-05-29 ENCOUNTER — Encounter
Admission: RE | Disposition: A | Discharge status: Home / Self Care | Payer: Self-pay | Source: Ambulatory Visit | Attending: Vascular Surgery

## 2017-05-29 ENCOUNTER — Ambulatory Visit: Payer: Medicare HMO | Admitting: Anesthesiology

## 2017-05-29 ENCOUNTER — Encounter: Payer: Self-pay | Admitting: Emergency Medicine

## 2017-05-29 ENCOUNTER — Ambulatory Visit
Admission: RE | Admit: 2017-05-29 | Discharge: 2017-05-29 | Disposition: A | Discharge status: Home / Self Care | Payer: Medicare HMO | Source: Ambulatory Visit | Attending: Vascular Surgery | Admitting: Vascular Surgery

## 2017-05-29 DIAGNOSIS — N183 Chronic kidney disease, stage 3 (moderate): Secondary | ICD-10-CM | POA: Insufficient documentation

## 2017-05-29 DIAGNOSIS — L97519 Non-pressure chronic ulcer of other part of right foot with unspecified severity: Secondary | ICD-10-CM | POA: Diagnosis not present

## 2017-05-29 DIAGNOSIS — Z888 Allergy status to other drugs, medicaments and biological substances status: Secondary | ICD-10-CM | POA: Insufficient documentation

## 2017-05-29 DIAGNOSIS — E1151 Type 2 diabetes mellitus with diabetic peripheral angiopathy without gangrene: Secondary | ICD-10-CM | POA: Insufficient documentation

## 2017-05-29 DIAGNOSIS — E11621 Type 2 diabetes mellitus with foot ulcer: Secondary | ICD-10-CM | POA: Diagnosis not present

## 2017-05-29 DIAGNOSIS — F0390 Unspecified dementia without behavioral disturbance: Secondary | ICD-10-CM | POA: Diagnosis not present

## 2017-05-29 DIAGNOSIS — E785 Hyperlipidemia, unspecified: Secondary | ICD-10-CM | POA: Diagnosis not present

## 2017-05-29 DIAGNOSIS — Z89421 Acquired absence of other right toe(s): Secondary | ICD-10-CM | POA: Insufficient documentation

## 2017-05-29 DIAGNOSIS — R0602 Shortness of breath: Secondary | ICD-10-CM | POA: Insufficient documentation

## 2017-05-29 DIAGNOSIS — I129 Hypertensive chronic kidney disease with stage 1 through stage 4 chronic kidney disease, or unspecified chronic kidney disease: Secondary | ICD-10-CM | POA: Insufficient documentation

## 2017-05-29 DIAGNOSIS — E1122 Type 2 diabetes mellitus with diabetic chronic kidney disease: Secondary | ICD-10-CM | POA: Insufficient documentation

## 2017-05-29 DIAGNOSIS — M1990 Unspecified osteoarthritis, unspecified site: Secondary | ICD-10-CM | POA: Diagnosis not present

## 2017-05-29 DIAGNOSIS — Z882 Allergy status to sulfonamides status: Secondary | ICD-10-CM | POA: Insufficient documentation

## 2017-05-29 DIAGNOSIS — Z87891 Personal history of nicotine dependence: Secondary | ICD-10-CM | POA: Insufficient documentation

## 2017-05-29 DIAGNOSIS — Z794 Long term (current) use of insulin: Secondary | ICD-10-CM | POA: Diagnosis not present

## 2017-05-29 HISTORY — PX: GRAFT APPLICATION: SHX6696

## 2017-05-29 LAB — GLUCOSE, CAPILLARY
GLUCOSE-CAPILLARY: 112 mg/dL — AB (ref 65–99)
GLUCOSE-CAPILLARY: 121 mg/dL — AB (ref 65–99)

## 2017-05-29 LAB — ABO/RH: ABO/RH(D): AB POS

## 2017-05-29 SURGERY — GRAFT APPLICATION
Anesthesia: General | Site: Foot | Laterality: Right | Wound class: Clean Contaminated

## 2017-05-29 MED ORDER — PROPOFOL 10 MG/ML IV BOLUS
INTRAVENOUS | Status: DC | PRN
  Administered 2017-05-29: 150 mg via INTRAVENOUS

## 2017-05-29 MED ORDER — ACETAMINOPHEN 10 MG/ML IV SOLN
INTRAVENOUS | Status: DC | PRN
  Administered 2017-05-29: 1000 mg via INTRAVENOUS

## 2017-05-29 MED ORDER — LIDOCAINE-EPINEPHRINE 1 %-1:100000 IJ SOLN
INTRAMUSCULAR | Status: AC
  Filled 2017-05-29: qty 1

## 2017-05-29 MED ORDER — FENTANYL CITRATE (PF) 100 MCG/2ML IJ SOLN
INTRAMUSCULAR | Status: AC
  Filled 2017-05-29: qty 2

## 2017-05-29 MED ORDER — PROPOFOL 10 MG/ML IV BOLUS
INTRAVENOUS | Status: AC
  Filled 2017-05-29: qty 20

## 2017-05-29 MED ORDER — OXYCODONE HCL 5 MG PO TABS
5.0000 mg | ORAL_TABLET | ORAL | 0 refills | Status: DC | PRN
Start: 2017-05-29 — End: 2022-01-03

## 2017-05-29 MED ORDER — LIDOCAINE HCL (CARDIAC) 20 MG/ML IV SOLN
INTRAVENOUS | Status: DC | PRN
  Administered 2017-05-29: 100 mg via INTRAVENOUS

## 2017-05-29 MED ORDER — CHLORHEXIDINE GLUCONATE CLOTH 2 % EX PADS: 6.0000 | MEDICATED_PAD | Freq: Once | CUTANEOUS | Status: DC

## 2017-05-29 MED ORDER — LIDOCAINE HCL (PF) 2 % IJ SOLN
INTRAMUSCULAR | Status: AC
  Filled 2017-05-29: qty 4

## 2017-05-29 MED ORDER — FAMOTIDINE 20 MG PO TABS
ORAL_TABLET | ORAL | Status: AC
  Administered 2017-05-29: 20 mg via ORAL
  Filled 2017-05-29: qty 1

## 2017-05-29 MED ORDER — ONDANSETRON HCL 4 MG/2ML IJ SOLN: 4.0000 mg | Freq: Once | INTRAMUSCULAR | Status: DC | PRN

## 2017-05-29 MED ORDER — ACETAMINOPHEN 10 MG/ML IV SOLN
INTRAVENOUS | Status: AC
Start: 2017-05-29 — End: 2017-05-29
  Filled 2017-05-29: qty 100

## 2017-05-29 MED ORDER — LIDOCAINE-EPINEPHRINE 1 %-1:100000 IJ SOLN
INTRAMUSCULAR | Status: DC | PRN
  Administered 2017-05-29: 30 mL

## 2017-05-29 MED ORDER — FAMOTIDINE 20 MG PO TABS
20.0000 mg | ORAL_TABLET | Freq: Once | ORAL | Status: AC
  Administered 2017-05-29: 20 mg via ORAL

## 2017-05-29 MED ORDER — VANCOMYCIN HCL IN DEXTROSE 1-5 GM/200ML-% IV SOLN
INTRAVENOUS | Status: AC
  Administered 2017-05-29: 1000 mg via INTRAVENOUS
  Filled 2017-05-29: qty 200

## 2017-05-29 MED ORDER — SODIUM CHLORIDE 0.9 % IV SOLN
INTRAVENOUS | Status: DC
Start: 2017-05-29 — End: 2017-05-29
  Administered 2017-05-29: 06:00:00 via INTRAVENOUS

## 2017-05-29 MED ORDER — MIDAZOLAM HCL 2 MG/2ML IJ SOLN
INTRAMUSCULAR | Status: AC
  Filled 2017-05-29: qty 2

## 2017-05-29 MED ORDER — FENTANYL CITRATE (PF) 100 MCG/2ML IJ SOLN: 25.0000 ug | INTRAMUSCULAR | Status: DC | PRN

## 2017-05-29 MED ORDER — FENTANYL CITRATE (PF) 100 MCG/2ML IJ SOLN
INTRAMUSCULAR | Status: DC | PRN
  Administered 2017-05-29 (×2): 50 ug via INTRAVENOUS
  Administered 2017-05-29: 100 ug via INTRAVENOUS

## 2017-05-29 MED ORDER — ONDANSETRON HCL 4 MG/2ML IJ SOLN
INTRAMUSCULAR | Status: DC | PRN
  Administered 2017-05-29: 4 mg via INTRAVENOUS

## 2017-05-29 SURGICAL SUPPLY — 25 items
BLADE CLIPPER SURG (BLADE) IMPLANT
BLADE DERMATOME SS (BLADE) ×3 IMPLANT
CHLORAPREP W/TINT 26ML (MISCELLANEOUS) ×6 IMPLANT
CUP MEDICINE 2OZ PLAST GRAD ST (MISCELLANEOUS) ×3 IMPLANT
DERMACARRIER 1-1.5 (MISCELLANEOUS) IMPLANT
DERMACARRIER 3-1 (MISCELLANEOUS) ×3 IMPLANT
DRAPE LAPAROTOMY 100X77 ABD (DRAPES) ×3 IMPLANT
DRAPE SHEET LG 3/4 BI-LAMINATE (DRAPES) ×6 IMPLANT
DRSG EMULSION OIL 3X8 NADH (GAUZE/BANDAGES/DRESSINGS) ×3 IMPLANT
DRSG TEGADERM 6X8 (GAUZE/BANDAGES/DRESSINGS) IMPLANT
DRSG TELFA 3X8 NADH (GAUZE/BANDAGES/DRESSINGS) IMPLANT
ELECT REM PT RETURN 9FT ADLT (ELECTROSURGICAL) ×3
ELECTRODE REM PT RTRN 9FT ADLT (ELECTROSURGICAL) ×1 IMPLANT
GAUZE SPONGE 4X4 12PLY STRL (GAUZE/BANDAGES/DRESSINGS) IMPLANT
GLOVE BIO SURGEON STRL SZ7 (GLOVE) ×6 IMPLANT
GLOVE INDICATOR 7.5 STRL GRN (GLOVE) ×3 IMPLANT
GOWN STRL REUS W/ TWL LRG LVL3 (GOWN DISPOSABLE) ×3 IMPLANT
GOWN STRL REUS W/TWL LRG LVL3 (GOWN DISPOSABLE) ×6
KIT PREVENA INCISION MGT 13 (CANNISTER) ×3 IMPLANT
KIT RM TURNOVER STRD PROC AR (KITS) ×3 IMPLANT
LABEL OR SOLS (LABEL) ×3 IMPLANT
NS IRRIG 500ML POUR BTL (IV SOLUTION) ×3 IMPLANT
PACK BASIN MINOR ARMC (MISCELLANEOUS) ×3 IMPLANT
SPONGE LAP 18X18 5 PK (GAUZE/BANDAGES/DRESSINGS) ×3 IMPLANT
SUT ETHILON 5-0 FS-2 18 BLK (SUTURE) ×3 IMPLANT

## 2017-05-29 NOTE — Anesthesia Procedure Notes (Signed)
Procedure Name: LMA Insertion Date/Time: 05/29/2017 7:55 AM Performed by: Junious Silk Pre-anesthesia Checklist: Patient identified, Patient being monitored, Timeout performed, Emergency Drugs available and Suction available Patient Re-evaluated:Patient Re-evaluated prior to induction Oxygen Delivery Method: Circle system utilized Preoxygenation: Pre-oxygenation with 100% oxygen Induction Type: IV induction Ventilation: Mask ventilation without difficulty LMA: LMA inserted LMA Size: 4.5 Tube type: Oral Number of attempts: 1 Placement Confirmation: positive ETCO2 and breath sounds checked- equal and bilateral Tube secured with: Tape Dental Injury: Teeth and Oropharynx as per pre-operative assessment

## 2017-05-29 NOTE — OR Nursing (Signed)
Wound vac has been working properly since Dr. [name] has come fix the dressing.

## 2017-05-29 NOTE — Anesthesia Preprocedure Evaluation (Signed)
Anesthesia Evaluation  Patient identified by MRN, date of birth, ID band Patient awake    Reviewed: Allergy & Precautions, H&P , NPO status , Patient's Chart, lab work & pertinent test results, reviewed documented beta blocker date and time   Airway Mallampati: II   Neck ROM: full    Dental  (+) Poor Dentition, Chipped   Pulmonary neg pulmonary ROS, shortness of breath, former smoker,    Pulmonary exam normal        Cardiovascular hypertension, + Peripheral Vascular Disease  negative cardio ROS Normal cardiovascular exam Rhythm:regular Rate:Normal     Neuro/Psych negative neurological ROS  negative psych ROS   GI/Hepatic negative GI ROS, Neg liver ROS,   Endo/Other  negative endocrine ROSdiabetes  Renal/GU Renal InsufficiencyRenal diseasenegative Renal ROS Bladder dysfunction      Musculoskeletal  (+) Arthritis , Osteoarthritis,    Abdominal   Peds  Hematology negative hematology ROS (+)   Anesthesia Other Findings Past Medical History: No date: Arthritis No date: Bladder incontinence No date: Chronic kidney disease     Comment: Stage 3 Kidney disease No date: Dementia No date: Diabetes mellitus without complication (HCC) No date: Dyspnea     Comment: occassional No date: Hyperlipidemia No date: Hypertension No date: Peripheral vascular disease (HCC) No date: Sepsis (HCC) Past Surgical History: 10/10/2016: ABDOMINAL AORTOGRAM W/LOWER EXTREMITY N/A     Comment: Procedure: Abdominal Aortogram w/Lower               Extremity;  Surgeon: Renford Dills, MD;                Location: [facility] INVASIVE CV LAB;  Service:               Cardiovascular;  Laterality: N/A; No date: EYE SURGERY Left     Comment: Retina Detachment 10/03/2016: IRRIGATION AND DEBRIDEMENT FOOT Right     Comment: Procedure: IRRIGATION AND DEBRIDEMENT FOOT;                Surgeon: Gwyneth Revels, DPM;  Location: [facility]               ORS;   Service: Podiatry;  Laterality: Right; 10/10/2016: LOWER EXTREMITY INTERVENTION     Comment: Procedure: Lower Extremity Intervention;                Surgeon: Renford Dills, MD;  Location: [facility]              INVASIVE CV LAB;  Service: Cardiovascular;; 10/10/2016: PERIPHERAL VASCULAR BALLOON ANGIOPLASTY Left     Comment: Procedure: Peripheral Vascular Balloon               Angioplasty;  Surgeon: Renford Dills, MD;                Location: [facility] INVASIVE CV LAB;  Service:               Cardiovascular;  Laterality: Left; No date: TONSILLECTOMY BMI    Body Mass Index:  33.09 kg/m     Reproductive/Obstetrics negative OB ROS                             Anesthesia Physical  Anesthesia Plan  ASA: III  Anesthesia Plan: General   Post-op Pain Management:    Induction:   PONV Risk Score and Plan: 2 and Ondansetron and Dexamethasone  Airway Management Planned: LMA and Oral ETT  Additional Equipment:  Intra-op Plan:   Post-operative Plan: Extubation in OR  Informed Consent: I have reviewed the patients History and Physical, chart, labs and discussed the procedure including the risks, benefits and alternatives for the proposed anesthesia with the patient or authorized representative who has indicated his/her understanding and acceptance.   Dental Advisory Given  Plan Discussed with: CRNA and Surgeon  Anesthesia Plan Comments:         Anesthesia Quick Evaluation

## 2017-05-29 NOTE — Transfer of Care (Signed)
Immediate Anesthesia Transfer of Care Note  Patient: [name]  Procedure(s) Performed: Procedure(s): GRAFT APPLICATION ( RIGHT FOOT ) (Right)  Patient Location: PACU  Anesthesia Type:General  Level of Consciousness: sedated  Airway & Oxygen Therapy: Patient Spontanous Breathing and Patient connected to face mask oxygen  Post-op Assessment: Report given to RN and Post -op Vital signs reviewed and stable  Post vital signs: Reviewed and stable  Last Vitals:  Vitals:   05/29/17 0612  BP: (!) 144/78  Pulse: 78  Resp: 18  Temp: (!) 35.6 C  SpO2: 99%    Last Pain:  Vitals:   05/29/17 0612  TempSrc: Tympanic         Complications: No apparent anesthesia complications

## 2017-05-29 NOTE — H&P (Signed)
[facility] History & Physical Update  The patient was interviewed and re-examined.  The patient's previous History and Physical has been reviewed and is unchanged.  There is no change in the plan of care. We plan to proceed with the scheduled procedure.  Levora Dredge, MD  05/29/2017, 7:28 AM

## 2017-05-29 NOTE — Anesthesia Postprocedure Evaluation (Signed)
Anesthesia Post Note  Patient: [name]  Procedure(s) Performed: Procedure(s) (LRB): GRAFT APPLICATION ( RIGHT FOOT ) (Right)  Patient location during evaluation: PACU Anesthesia Type: General Level of consciousness: awake and alert and oriented Pain management: pain level controlled Vital Signs Assessment: post-procedure vital signs reviewed and stable Respiratory status: spontaneous breathing Cardiovascular status: blood pressure returned to baseline Anesthetic complications: no     Last Vitals:  Vitals:   05/29/17 1034 05/29/17 1152  BP: (!) 172/84 (!) 144/78  Pulse: 70 78  Resp: 16 16  Temp: (!) 35.8 C   SpO2:  98%    Last Pain:  Vitals:   05/29/17 1034  TempSrc: Temporal                 Jasmin Winberry

## 2017-05-29 NOTE — OR Nursing (Signed)
Wound vac's yellow light is on, Dr. [name] notified, he is at pt's bedside evaluating the wound vac dressing.

## 2017-05-29 NOTE — Discharge Instructions (Signed)
AMBULATORY SURGERY  DISCHARGE INSTRUCTIONS   1) The drugs that you were given will stay in your system until tomorrow so for the next 24 hours you should not:  A) Drive an automobile B) Make any legal decisions C) Drink any alcoholic beverage   2) You may resume regular meals tomorrow.  Today it is better to start with liquids and gradually work up to solid foods.  You may eat anything you prefer, but it is better to start with liquids, then soup and crackers, and gradually work up to solid foods.   3) Please notify your doctor immediately if you have any unusual bleeding, trouble breathing, redness and pain at the surgery site, drainage, fever, or pain not relieved by medication.   4) Additional Instructions:Bring wound vac supplies to Appointment with Dr. [name] on Monday Oct. 1, 2018. Keep right leg elevated on 2 pillows.  Keep both dressing clean and dry.  Sponge bathing only.  Minimal walking, use wedge shoe to protect right foot.

## 2017-05-29 NOTE — Op Note (Signed)
    OPERATIVE NOTE   PROCEDURE: Debridement right 8 cm x 6 cm foot ulcer Split thickness skin graft right 8 cm x 16 mm foot ulcer  PRE-OPERATIVE DIAGNOSIS: Diabetic foot ulcer right foot  POST-OPERATIVE DIAGNOSIS: Same  SURGEON: Levora Dredge  ASSISTANT(S): None  ANESTHESIA: general  ESTIMATED BLOOD LOSS: 15 cc  FINDING(S): Lateral right foot ulcer measuring 8 cm x 6 cm  SPECIMEN(S):  None  INDICATIONS:   [name] is a 66 y.o. male who presents with a granulating right foot ulcer. His wound has been improving and he is now undergoing split thickness skin graft for closure..  DESCRIPTION: After full informed written consent was obtained from the patient, the patient was brought back to the operating room and placed supine upon the operating table.  Prior to induction, the patient received IV antibiotics.   After obtaining adequate anesthesia, the patient was then prepped and draped in the standard fashion for a  split thickness skin graft with wound debridement.  The right foot ulcer was completed with scissors as well as a #10 blade. Subcutaneous tissues and skin were removed. Bleeding was controlled with pressure and Bovie cautery. Once the wound up and thoroughly cleaned and was measured and found to be 8 cm in the long axis and 6 cm wide.  Attention was then turned to the right thigh where a Zimmer dermatome was set to 15,006 of an inch and a segment of skin harvested. Sponge soaked in lidocaine with epinephrine was then placed on the wound.  The skin specimen was then meshed 3-1 and then applied to the foot ulcer and stapled in place. Adaptic was then placed over the skin graft followed by April V Novak.  Attention was returned to the thigh bleeding was controlled and once the wound was right OpSite was placed.   The patient tolerated this procedure well.   COMPLICATIONS: None  CONDITION: [name]  Office:  [phone]   05/29/2017, 9:24 AM

## 2017-05-29 NOTE — Anesthesia Post-op Follow-up Note (Signed)
Anesthesia QCDR form completed.        

## 2017-06-01 ENCOUNTER — Telehealth (INDEPENDENT_AMBULATORY_CARE_PROVIDER_SITE_OTHER): Payer: Self-pay

## 2017-06-01 ENCOUNTER — Encounter (INDEPENDENT_AMBULATORY_CARE_PROVIDER_SITE_OTHER): Payer: Self-pay | Admitting: Vascular Surgery

## 2017-06-01 ENCOUNTER — Ambulatory Visit (INDEPENDENT_AMBULATORY_CARE_PROVIDER_SITE_OTHER): Payer: Medicare HMO | Admitting: Vascular Surgery

## 2017-06-01 VITALS — BP 133/71 | HR 89 | Resp 16 | Ht 72.0 in | Wt 260.0 lb

## 2017-06-01 DIAGNOSIS — I7025 Atherosclerosis of native arteries of other extremities with ulceration: Secondary | ICD-10-CM

## 2017-06-01 NOTE — Telephone Encounter (Signed)
Larita Fife from [facility] called stating that [employer] will not send supplies for the patient's wound vac, per Dr. [name] the vac is a [employer]a which is disposable and will be taken off shortly so no supplies are needed. A message was left for [facility] regarding this.

## 2017-06-01 NOTE — Progress Notes (Signed)
Patient ID: [name], male   DOB: [birth year], 66 y.o.   MRN: [id number]  Chief Complaint  Patient presents with  . Follow-up    Follow up    HPI [name] is a 66 y.o. male.  He states he is not having any pain at all  Past Medical History:  Diagnosis Date  . Arthritis   . Bladder incontinence   . Chronic kidney disease    Stage 3 Kidney disease  . Diabetes mellitus without complication (HCC)   . Dyspnea    occassional  . Hyperlipidemia   . Hypertension   . Peripheral vascular disease (HCC)   . Sepsis [facility])     Past Surgical History:  Procedure Laterality Date  . ABDOMINAL AORTOGRAM W/LOWER EXTREMITY N/A 10/10/2016   Procedure: Abdominal Aortogram w/Lower Extremity;  Surgeon: Renford Dills, MD;  Location: [facility] INVASIVE CV LAB;  Service: Cardiovascular;  Laterality: N/A;  . CIRCUMCISION  1998  . EYE SURGERY Left    Retina Detachment  . GRAFT APPLICATION Right 05/29/2017   Procedure: GRAFT APPLICATION ( RIGHT FOOT );  Surgeon: Renford Dills, MD;  Location: [facility] ORS;  Service: Vascular;  Laterality: Right;  graft taken from patients right thigh  . IRRIGATION AND DEBRIDEMENT FOOT Right 10/03/2016   Procedure: IRRIGATION AND DEBRIDEMENT FOOT;  Surgeon: Gwyneth Revels, DPM;  Location: [facility] ORS;  Service: Podiatry;  Laterality: Right;  . LOWER EXTREMITY INTERVENTION  10/10/2016   Procedure: Lower Extremity Intervention;  Surgeon: Renford Dills, MD;  Location: [facility] INVASIVE CV LAB;  Service: Cardiovascular;;  . PERIPHERAL VASCULAR BALLOON ANGIOPLASTY Left 10/10/2016   Procedure: Peripheral Vascular Balloon Angioplasty;  Surgeon: Renford Dills, MD;  Location: [facility] INVASIVE CV LAB;  Service: Cardiovascular;  Laterality: Left;  . TONSILLECTOMY    . WOUND DEBRIDEMENT Right 11/07/2016   Procedure: DEBRIDEMENT OF WOUND AND BONE RIGHT FOOT AND APPLY WOUND VAC;  Surgeon: Gwyneth Revels, DPM;  Location: [facility] ORS;  Service: Podiatry;  Laterality: Right;       Allergies  Allergen Reactions  . Sulfa Antibiotics Rash  . Zosyn [Piperacillin Sod-Tazobactam So] Rash    Current Outpatient Prescriptions  Medication Sig Dispense Refill  . atorvastatin (LIPITOR) 20 MG tablet Take 20 mg by mouth daily.    Marland Kitchen docusate sodium (COLACE) 100 MG capsule Take 1 capsule (100 mg total) by mouth 2 (two) times daily. (Patient not taking: Reported on 05/13/2017) 10 capsule 0  . insulin glargine (LANTUS) 100 UNIT/ML injection Inject 0.15 mLs (15 Units total) into the skin at bedtime. 10 mL 11  . insulin lispro (HUMALOG) 100 UNIT/ML injection Inject 4 Units into the skin See admin instructions. Inject 4 units SQ with breakfast, inject 4 units SQ with lunch and inject 4 units SQ with dinner    . metFORMIN (GLUCOPHAGE) 500 MG tablet Take 500 mg by mouth 2 (two) times daily with a meal.     . Multiple Vitamins-Minerals (CENTROVITE) TABS Take 1 tablet by mouth daily.    Marland Kitchen oxyCODONE (OXY IR/ROXICODONE) 5 MG immediate release tablet Take 1 tablet (5 mg total) by mouth every 4 (four) hours as needed for moderate pain. 20 tablet 0  . polyethylene glycol (MIRALAX / GLYCOLAX) packet Take 17 g by mouth daily.    . rivaroxaban (XARELTO) 20 MG TABS tablet Take 20 mg by mouth daily with supper.    . senna (SENOKOT) 8.6 MG TABS tablet Take 1 tablet by mouth 2 (two) times daily.    Marland Kitchen  Simethicone (GAS-X ULTRA STRENGTH PO) Take 1 capsule by mouth 4 (four) times daily as needed (for flatulence).     No current facility-administered medications for this visit.         Physical Exam BP 133/71 (BP Location: Left Arm)   Pulse 89   Resp 16   Ht 6' (1.829 m)   Wt 260 lb (117.9 kg)   BMI 35.26 kg/m  Gen:  WD/WN, NAD Skin:  VAC removed there is 90% take; donor site clean    Assessment/Plan:  1. Atherosclerosis of native arteries of the extremities with ulceration (HCC) Begin Bacitracin ointment to both sites cover with Adaptic and then gauze on a daily basis  He will  follow up with me in 1 week    Levora Dredge 06/01/2017, 4:36 PM   This note was created with Dragon medical transcription system.  Any errors from dictation are unintentional.

## 2017-06-02 NOTE — Telephone Encounter (Signed)
[name] from [facility] called wanting to know how to proceed with the patient's dressing changes. I let her know that Dr. Marijean Heath recommendation is to begin with Bacitracin ointment then cover both sides with Adaptic and gauze daily.

## 2017-06-04 ENCOUNTER — Ambulatory Visit (INDEPENDENT_AMBULATORY_CARE_PROVIDER_SITE_OTHER): Payer: Medicare HMO | Admitting: Vascular Surgery

## 2017-06-08 ENCOUNTER — Ambulatory Visit (INDEPENDENT_AMBULATORY_CARE_PROVIDER_SITE_OTHER): Payer: Medicare HMO | Admitting: Vascular Surgery

## 2017-06-08 ENCOUNTER — Encounter (INDEPENDENT_AMBULATORY_CARE_PROVIDER_SITE_OTHER): Payer: Self-pay | Admitting: Vascular Surgery

## 2017-06-08 VITALS — BP 116/66 | HR 82 | Resp 16

## 2017-06-08 DIAGNOSIS — I7025 Atherosclerosis of native arteries of other extremities with ulceration: Secondary | ICD-10-CM

## 2017-06-10 NOTE — Progress Notes (Signed)
Patient ID: [name], male   DOB: 20-Feb-1951, 66 y.o.   MRN: [id number]  Chief Complaint  Patient presents with  . Follow-up    3-4wk post laser    HPI [name] is a 66 y.o. male.  Denies pain  Past Medical History:  Diagnosis Date  . Arthritis   . Bladder incontinence   . Chronic kidney disease    Stage 3 Kidney disease  . Diabetes mellitus without complication (HCC)   . Dyspnea    occassional  . Hyperlipidemia   . Hypertension   . Peripheral vascular disease (HCC)   . Sepsis [facility])     Past Surgical History:  Procedure Laterality Date  . ABDOMINAL AORTOGRAM W/LOWER EXTREMITY N/A 10/10/2016   Procedure: Abdominal Aortogram w/Lower Extremity;  Surgeon: Renford Dills, MD;  Location: [facility] INVASIVE CV LAB;  Service: Cardiovascular;  Laterality: N/A;  . CIRCUMCISION  1998  . EYE SURGERY Left    Retina Detachment  . GRAFT APPLICATION Right 05/29/2017   Procedure: GRAFT APPLICATION ( RIGHT FOOT );  Surgeon: Renford Dills, MD;  Location: [facility] ORS;  Service: Vascular;  Laterality: Right;  graft taken from patients right thigh  . IRRIGATION AND DEBRIDEMENT FOOT Right 10/03/2016   Procedure: IRRIGATION AND DEBRIDEMENT FOOT;  Surgeon: Gwyneth Revels, DPM;  Location: [facility] ORS;  Service: Podiatry;  Laterality: Right;  . LOWER EXTREMITY INTERVENTION  10/10/2016   Procedure: Lower Extremity Intervention;  Surgeon: Renford Dills, MD;  Location: [facility] INVASIVE CV LAB;  Service: Cardiovascular;;  . PERIPHERAL VASCULAR BALLOON ANGIOPLASTY Left 10/10/2016   Procedure: Peripheral Vascular Balloon Angioplasty;  Surgeon: Renford Dills, MD;  Location: [facility] INVASIVE CV LAB;  Service: Cardiovascular;  Laterality: Left;  . TONSILLECTOMY    . WOUND DEBRIDEMENT Right 11/07/2016   Procedure: DEBRIDEMENT OF WOUND AND BONE RIGHT FOOT AND APPLY WOUND VAC;  Surgeon: Gwyneth Revels, DPM;  Location: [facility] ORS;  Service: Podiatry;  Laterality: Right;      Allergies  Allergen  Reactions  . Sulfa Antibiotics Rash  . Zosyn [Piperacillin Sod-Tazobactam So] Rash    Current Outpatient Prescriptions  Medication Sig Dispense Refill  . atorvastatin (LIPITOR) 20 MG tablet Take 20 mg by mouth daily.    . insulin glargine (LANTUS) 100 UNIT/ML injection Inject 0.15 mLs (15 Units total) into the skin at bedtime. 10 mL 11  . insulin lispro (HUMALOG) 100 UNIT/ML injection Inject 4 Units into the skin See admin instructions. Inject 4 units SQ with breakfast, inject 4 units SQ with lunch and inject 4 units SQ with dinner    . metFORMIN (GLUCOPHAGE) 500 MG tablet Take 500 mg by mouth 2 (two) times daily with a meal.     . Multiple Vitamins-Minerals (CENTROVITE) TABS Take 1 tablet by mouth daily.    Marland Kitchen oxyCODONE (OXY IR/ROXICODONE) 5 MG immediate release tablet Take 1 tablet (5 mg total) by mouth every 4 (four) hours as needed for moderate pain. 20 tablet 0  . polyethylene glycol (MIRALAX / GLYCOLAX) packet Take 17 g by mouth daily.    . rivaroxaban (XARELTO) 20 MG TABS tablet Take 20 mg by mouth daily with supper.    . senna (SENOKOT) 8.6 MG TABS tablet Take 1 tablet by mouth 2 (two) times daily.    . Simethicone (GAS-X ULTRA STRENGTH PO) Take 1 capsule by mouth 4 (four) times daily as needed (for flatulence).    Marland Kitchen docusate sodium (COLACE) 100 MG capsule Take 1 capsule (100  mg total) by mouth 2 (two) times daily. (Patient not taking: Reported on 05/13/2017) 10 capsule 0   No current facility-administered medications for this visit.         Physical Exam BP 116/66 (BP Location: Right Arm)   Pulse 82   Resp 16  Gen:  WD/WN, NAD Skin: skin graft appears to have 80% take    Assessment/Plan:  S/p skin grafting of the foot  Continue ointment daily      Levora Dredge 06/10/2017, 9:05 PM   This note was created with Dragon medical transcription system.  Any errors from dictation are unintentional.

## 2017-06-22 ENCOUNTER — Encounter (INDEPENDENT_AMBULATORY_CARE_PROVIDER_SITE_OTHER): Payer: Self-pay | Admitting: Vascular Surgery

## 2017-06-22 ENCOUNTER — Ambulatory Visit (INDEPENDENT_AMBULATORY_CARE_PROVIDER_SITE_OTHER): Payer: Medicare HMO | Admitting: Vascular Surgery

## 2017-06-22 VITALS — BP 126/79 | HR 81 | Resp 16

## 2017-06-22 DIAGNOSIS — L97514 Non-pressure chronic ulcer of other part of right foot with necrosis of bone: Secondary | ICD-10-CM

## 2017-06-22 DIAGNOSIS — I7025 Atherosclerosis of native arteries of other extremities with ulceration: Secondary | ICD-10-CM

## 2017-06-22 NOTE — Progress Notes (Signed)
Patient ID: [name], male   DOB: [birth year], 66 y.o.   MRN: [id number]  Chief Complaint  Patient presents with  . Follow-up    2wk check    HPI [name] is a 66 y.o. male.  Here for f/u s/p right foot STSG.  No pain no fevers   Past Medical History:  Diagnosis Date  . Arthritis   . Bladder incontinence   . Chronic kidney disease    Stage 3 Kidney disease  . Diabetes mellitus without complication (HCC)   . Dyspnea    occassional  . Hyperlipidemia   . Hypertension   . Peripheral vascular disease (HCC)   . Sepsis [facility])     Past Surgical History:  Procedure Laterality Date  . ABDOMINAL AORTOGRAM W/LOWER EXTREMITY N/A 10/10/2016   Procedure: Abdominal Aortogram w/Lower Extremity;  Surgeon: Renford Dills, MD;  Location: [facility] INVASIVE CV LAB;  Service: Cardiovascular;  Laterality: N/A;  . CIRCUMCISION  1998  . EYE SURGERY Left    Retina Detachment  . GRAFT APPLICATION Right 05/29/2017   Procedure: GRAFT APPLICATION ( RIGHT FOOT );  Surgeon: Renford Dills, MD;  Location: [facility] ORS;  Service: Vascular;  Laterality: Right;  graft taken from patients right thigh  . IRRIGATION AND DEBRIDEMENT FOOT Right 10/03/2016   Procedure: IRRIGATION AND DEBRIDEMENT FOOT;  Surgeon: Gwyneth Revels, DPM;  Location: [facility] ORS;  Service: Podiatry;  Laterality: Right;  . LOWER EXTREMITY INTERVENTION  10/10/2016   Procedure: Lower Extremity Intervention;  Surgeon: Renford Dills, MD;  Location: [facility] IN[facility];  Service: Cardiovascular;;  . PERIPHERAL VASCULAR BALLOON ANGIOPLASTY Left 10/10/2016   Procedure: Peripheral Vascular Balloon Angioplasty;  Surgeon: Renford Dills, MD;  Location: [facility] INV[facility] CV LAB;  Service: Cardiovascular;  Laterality: Left;  . TONSILLECTOMY    . WOUND DEBRIDEMENT Right 11/07/2016   Procedure: DEBRIDEMENT OF WOUND AND BONE RIGHT FOOT AND APPLY WOUND VAC;  Surgeon: Gwyneth Revels, DPM;  Location: [facility] ORS;  Service: Podiatry;  Laterality: Right;        Allergies  Allergen Reactions  . Sulfa Antibiotics Rash  . Zosyn [Piperacillin Sod-Tazobactam So] Rash    Current Outpatient Prescriptions  Medication Sig Dispense Refill  . atorvastatin (LIPITOR) 20 MG tablet Take 20 mg by mouth daily.    . insulin glargine (LANTUS) 100 UNIT/ML injection Inject 0.15 mLs (15 Units total) into the skin at bedtime. 10 mL 11  . insulin lispro (HUMALOG) 100 UNIT/ML injection Inject 4 Units into the skin See admin instructions. Inject 4 units SQ with breakfast, inject 4 units SQ with lunch and inject 4 units SQ with dinner    . metFORMIN (GLUCOPHAGE) 500 MG tablet Take 500 mg by mouth 2 (two) times daily with a meal.     . Multiple Vitamins-Minerals (CENTROVITE) TABS Take 1 tablet by mouth daily.    Marland Kitchen oxyCODONE (OXY IR/ROXICODONE) 5 MG immediate release tablet Take 1 tablet (5 mg total) by mouth every 4 (four) hours as needed for moderate pain. 20 tablet 0  . polyethylene glycol (MIRALAX / GLYCOLAX) packet Take 17 g by mouth daily.    . rivaroxaban (XARELTO) 20 MG TABS tablet Take 20 mg by mouth daily with supper.    . senna (SENOKOT) 8.6 MG TABS tablet Take 1 tablet by mouth 2 (two) times daily.    . Simethicone (GAS-X ULTRA STRENGTH PO) Take 1 capsule by mouth 4 (four) times daily as needed (for flatulence).    Marland Kitchen  docusate sodium (COLACE) 100 MG capsule Take 1 capsule (100 mg total) by mouth 2 (two) times daily. (Patient not taking: Reported on 05/13/2017) 10 capsule 0   No current facility-administered medications for this visit.         Physical Exam BP 126/79 (BP Location: Left Arm)   Pulse 81   Resp 16  Gen:  WD/WN, NAD Skin:  Right foot wound 80-90% epithelialized donor sit still not healed, dry gauze is adherent to the donor site and is removing the new skin   Assessment/Plan:  1. 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  Begin using  Adaptic to the donor site as well  2. Atherosclerosis of native arteries of the extremities with ulceration (HCC)  Recommend:  The patient has evidence of atherosclerosis of the lower extremities with claudication.  The patient does not voice lifestyle limiting changes at this point in time.  His graft is healing nicely.  OK for him to change to a flat post op shoe and begin ambulating  No invasive studies, angiography or surgery at this time The patient should continue walking and begin a more formal exercise program.  The patient should continue antiplatelet therapy and aggressive treatment of the lipid abnormalities  No changes in the patient's medications at this time    Levora DredgeGregory Nic Lampe 06/22/2017, 11:29 AM   This note was created with Dragon medical transcription system.  Any errors from dictation are unintentional.

## 2017-06-24 ENCOUNTER — Telehealth (INDEPENDENT_AMBULATORY_CARE_PROVIDER_SITE_OTHER): Payer: Self-pay

## 2017-06-24 NOTE — Telephone Encounter (Signed)
The patient is full weight baring with a post-op shoe. Thanks.

## 2017-06-24 NOTE — Telephone Encounter (Signed)
[facility] let [facility] know the instructions as to when the patient [name] could start to ambulate. Had to leave a message with these instructions

## 2017-07-06 ENCOUNTER — Ambulatory Visit (INDEPENDENT_AMBULATORY_CARE_PROVIDER_SITE_OTHER): Payer: Medicare HMO | Admitting: Vascular Surgery

## 2017-07-06 ENCOUNTER — Encounter (INDEPENDENT_AMBULATORY_CARE_PROVIDER_SITE_OTHER): Payer: Self-pay | Admitting: Vascular Surgery

## 2017-07-06 VITALS — BP 163/72 | HR 50 | Resp 16

## 2017-07-06 DIAGNOSIS — E1152 Type 2 diabetes mellitus with diabetic peripheral angiopathy with gangrene: Secondary | ICD-10-CM

## 2017-07-06 DIAGNOSIS — I7025 Atherosclerosis of native arteries of other extremities with ulceration: Secondary | ICD-10-CM

## 2017-07-06 DIAGNOSIS — Z794 Long term (current) use of insulin: Secondary | ICD-10-CM

## 2017-07-06 NOTE — Progress Notes (Signed)
Subjective:    Patient ID: [name], male    DOB: 1951/08/18, 66 y.o.   MRN: [id number] Chief Complaint  Patient presents with  . Follow-up    2week check   Patient presents for a month wound check. He is status post a skin graft to the right foot. Donor site is from right thigh. Patient presents without complaint today. Patient continues to reside in rehabilitation. Patient continues local wound care from rehabilitation. Patient denies any drainage from either site. Patient denies any fever, nausea or vomiting. Patient continues to wear his postoperative shoe.   Review of Systems  Constitutional: Negative.   HENT: Negative.   Eyes: Negative.   Respiratory: Negative.   Cardiovascular: Positive for leg swelling.  Gastrointestinal: Negative.   Endocrine: Negative.   Genitourinary: Negative.   Musculoskeletal: Negative.   Skin: Positive for wound.  Allergic/Immunologic: Negative.   Neurological: Negative.   Hematological: Negative.   Psychiatric/Behavioral: Negative.       Objective:   Physical Exam  Constitutional: He is oriented to person, place, and time. He appears well-developed and well-nourished. No distress.  HENT:  Head: Normocephalic and atraumatic.  Eyes: Conjunctivae are normal. Pupils are equal, round, and reactive to light.  Neck: Normal range of motion.  Cardiovascular: Normal rate, regular rhythm, normal heart sounds and intact distal pulses.  Pulses:      Radial pulses are 2+ on the right side, and 2+ on the left side.  Hard to palpate pedal pulses to the right lower extremity due to edema  Pulmonary/Chest: Effort normal.  Musculoskeletal: Normal range of motion. He exhibits edema (there is mild to moderate nonpitting edema to the right lower extremity).  Neurological: He is alert and oriented to person, place, and time.  Skin: He is not diaphoretic.  Right foot: the wound bed is full of granulation tissue. The surrounding skin is intact. There is  a minimal amount of slough to the wound. Right thigh donor site: healing well. There are no signs of infection to either wound  Psychiatric: He has a normal mood and affect. His behavior is normal. Judgment and thought content normal.  Vitals reviewed.  BP (!) 163/72 (BP Location: Right Arm)   Pulse (!) 50   Resp 16   Past Medical History:  Diagnosis Date  . Arthritis   . Bladder incontinence   . Chronic kidney disease    Stage 3 Kidney disease  . Diabetes mellitus without complication (HCC)   . Dyspnea    occassional  . Hyperlipidemia   . Hypertension   . Peripheral vascular disease (HCC)   . Sepsis [facility](HCC)    Social History   Socioeconomic History  . Marital status: Single    Spouse name: Not on file  . Number of children: Not on file  . Years of education: Not on file  . Highest education level: Not on file  Social Needs  . Financial resource strain: Not on file  . Food insecurity - worry: Not on file  . Food insecurity - inability: Not on file  . Transportation needs - medical: Not on file  . Transportation needs - non-medical: Not on file  Occupational History  . Not on file  Tobacco Use  . Smoking status: Former Smoker    Types: Pipe    Last attempt to quit: 05/23/1975    Years since quitting: 42.1  . Smokeless tobacco: Never Used  Substance and Sexual Activity  . Alcohol use: No  .  Drug use: No  . Sexual activity: Not on file  Other Topics Concern  . Not on file  Social History Narrative  . Not on file   Past Surgical History:  Procedure Laterality Date  . CIRCUMCISION  1998  . EYE SURGERY Left    Retina Detachment  . TONSILLECTOMY     Family History  Problem Relation Age of Onset  . Diabetes Mother   . Hypertension Mother   . Diabetes Father   . Hypertension Father    Allergies  Allergen Reactions  . Sulfa Antibiotics Rash  . Zosyn [Piperacillin Sod-Tazobactam So] Rash      Assessment & Plan:  Patient presents for a month wound check.  He is status post a skin graft to the right foot. Donor site is from right thigh. Patient presents without complaint today. Patient continues to reside in rehabilitation. Patient continues local wound care from rehabilitation. Patient denies any drainage from either site. Patient denies any fever, nausea or vomiting. Patient continues to wear his postoperative shoe.  1. Atherosclerosis of native arteries of the extremities with ulceration (HCC) s/p skin graft application - Stable Patient presents with continued healing to the right foot graft. Wound is healthy and full of granulation tissue. Donor site is healing well I had a long discussion with the patient about proper elevation. Proper elevation is heart level or higher during the day as much as possible. I explained the consistency of the fluid that builds up in his foot can slow process of healing. The patient expresses his understanding will start to elevate his leg. I will see him back in 2 weeks for wound check  2. Type 2 diabetes mellitus with diabetic peripheral angiopathy and gangrene, with long-term current use of insulin (HCC) - Stable Encouraged good control as its slows the progression of atherosclerotic disease Poor diabetic control can impede wound healing. This is followed by his primary care physician  Current Outpatient Medications on File Prior to Visit  Medication Sig Dispense Refill  . atorvastatin (LIPITOR) 20 MG tablet Take 20 mg by mouth daily.    Marland Kitchen docusate sodium (COLACE) 100 MG capsule Take 1 capsule (100 mg total) by mouth 2 (two) times daily. 10 capsule 0  . insulin glargine (LANTUS) 100 UNIT/ML injection Inject 0.15 mLs (15 Units total) into the skin at bedtime. 10 mL 11  . insulin lispro (HUMALOG) 100 UNIT/ML injection Inject 4 Units into the skin See admin instructions. Inject 4 units SQ with breakfast, inject 4 units SQ with lunch and inject 4 units SQ with dinner    . metFORMIN (GLUCOPHAGE) 500 MG tablet  Take 500 mg by mouth 2 (two) times daily with a meal.     . Multiple Vitamins-Minerals (CENTROVITE) TABS Take 1 tablet by mouth daily.    Marland Kitchen oxyCODONE (OXY IR/ROXICODONE) 5 MG immediate release tablet Take 1 tablet (5 mg total) by mouth every 4 (four) hours as needed for moderate pain. 20 tablet 0  . polyethylene glycol (MIRALAX / GLYCOLAX) packet Take 17 g by mouth daily.    . rivaroxaban (XARELTO) 20 MG TABS tablet Take 20 mg by mouth daily with supper.    . senna (SENOKOT) 8.6 MG TABS tablet Take 1 tablet by mouth 2 (two) times daily.    . Simethicone (GAS-X ULTRA STRENGTH PO) Take 1 capsule by mouth 4 (four) times daily as needed (for flatulence).     No current facility-administered medications on file prior to visit.    There  are no Patient Instructions on file for this visit. No Follow-up on file.  Nekeya Briski A Huey Scalia, PA-C

## 2017-07-20 ENCOUNTER — Encounter (INDEPENDENT_AMBULATORY_CARE_PROVIDER_SITE_OTHER): Payer: Self-pay | Admitting: Vascular Surgery

## 2017-07-20 ENCOUNTER — Ambulatory Visit (INDEPENDENT_AMBULATORY_CARE_PROVIDER_SITE_OTHER): Payer: Medicare HMO | Admitting: Vascular Surgery

## 2017-07-20 VITALS — BP 125/75 | HR 81 | Resp 17

## 2017-07-20 DIAGNOSIS — I7025 Atherosclerosis of native arteries of other extremities with ulceration: Secondary | ICD-10-CM

## 2017-07-20 DIAGNOSIS — E1152 Type 2 diabetes mellitus with diabetic peripheral angiopathy with gangrene: Secondary | ICD-10-CM

## 2017-07-20 DIAGNOSIS — E782 Mixed hyperlipidemia: Secondary | ICD-10-CM

## 2017-07-20 DIAGNOSIS — Z794 Long term (current) use of insulin: Secondary | ICD-10-CM

## 2017-07-20 NOTE — Progress Notes (Signed)
Subjective:    Patient ID: [name], male    DOB: 07/28/1951, 66 y.o.   MRN: [id number]  Chief Complaint: Wound Check  Patient presents for a wound check.  The patient was last seen about 2 weeks ago for a skin graft wound check.  [facility]  He presents today without complaint.  He denies any increased pain to the right foot.  He denies any drainage from either the skin graft donor site or graft site.  The patient denies any fever, nausea vomiting.    Review of Systems  Constitutional: Negative.   HENT: Negative.   Eyes: Negative.   Respiratory: Negative.   Cardiovascular: Negative.   Gastrointestinal: Negative.   Endocrine: Negative.   Genitourinary: Negative.   Musculoskeletal: Negative.   Skin: Positive for wound.  Allergic/Immunologic: Negative.   Neurological: Negative.   Hematological: Negative.   Psychiatric/Behavioral: Negative.       Objective:   Physical Exam  Constitutional: He is oriented to person, place, and time. He appears well-developed and well-nourished. No distress.  HENT:  Head: Normocephalic and atraumatic.  Eyes: Conjunctivae are normal. Pupils are equal, round, and reactive to light.  Neck: Normal range of motion.  Cardiovascular: Normal rate, regular rhythm, normal heart sounds and intact distal pulses.  Pulses:      Radial pulses are 2+ on the right side, and 2+ on the left side.  Hard to palpate pedal pulses however his bilateral feet are warm  Pulmonary/Chest: Effort normal and breath sounds normal.  Musculoskeletal: Normal range of motion. He exhibits edema (Mild edema to the right foot.).  Neurological: He is alert and oriented to person, place, and time.  Skin: He is not diaphoretic.  Right foot: Skin graft site continues to heal.  Wound bed is full of granulation tissue with minimal fibrinous exudate noted.  There is no drainage.  Surrounding skin is healthy. Right thigh: Donor site is healing  well.  There are no signs of infection.  Psychiatric: He has a normal mood and affect. His behavior is normal. Judgment and thought content normal.  Vitals reviewed.  BP 125/75 (BP Location: Right Arm)   Pulse 81   Resp 17   Past Medical History:  Diagnosis Date  . Arthritis   . Bladder incontinence   . Chronic kidney disease    Stage 3 Kidney disease  . Diabetes mellitus without complication (HCC)   . Dyspnea    occassional  . Hyperlipidemia   . Hypertension   . Peripheral vascular disease (HCC)   . Sepsis [facility](HCC)    Social History   Socioeconomic History  . Marital status: Single    Spouse name: Not on file  . Number of children: Not on file  . Years of education: Not on file  . Highest education level: Not on file  Social Needs  . Financial resource strain: Not on file  . Food insecurity - worry: Not on file  . Food insecurity - inability: Not on file  . Transportation needs - medical: Not on file  . Transportation needs - non-medical: Not on file  Occupational History  . Not on file  Tobacco Use  . Smoking status: Former Smoker    Types: Pipe    Last attempt to quit: 05/23/1975    Years since quitting: 42.1  . Smokeless tobacco: Never Used  Substance and Sexual Activity  . Alcohol use: No  . Drug use: No  .  Sexual activity: Not on file  Other Topics Concern  . Not on file  Social History Narrative  . Not on file   Past Surgical History:  Procedure Laterality Date  . Abdominal Aortogram w/Lower Extremity N/A 10/10/2016   Performed by Renford DillsSchnier, Gregory G, [facility] LAB  . CIRCUMCISION  1998  . DEBRIDEMENT OF WOUND AND BONE RIGHT FOOT AND APPLY WOUND VAC Right 11/07/2016   Performed by Gwyneth RevelsFowler, Justin, DPM [facility]  . EYE SURGERY Left    Retina Detachment  . GRAFT APPLICATION ( RIGHT FOOT ) Right 05/29/2017   Performed by Renford DillsSchnier, Gregory G[facility]  . IRRIGATION AND DEBRIDEMENT FOOT Right 10/03/2016   Performed by Gwyneth RevelsFowler, Justin, DPM [facility]  . Lower Extremity Intervention  10/10/2016   Performed by Renford DillsSchnier, Gregory G, [facility]  . Peripheral Vascular Balloon Angioplasty Left 10/10/2016   Performed by Renford DillsSchnier, Gregory G, [facility]  . TONSILLECTOMY     Family History  Problem Relation Age of Onset  . Diabetes Mother   . Hypertension Mother   . Diabetes Father   . Hypertension Father    Allergies  Allergen Reactions  . Sulfa Antibiotics Rash  . Zosyn [Piperacillin Sod-Tazobactam So] Rash      Assessment & Plan:  Patient presents for a wound check.  The patient was last seen about 2 weeks ago for a skin graft wound check.  [facility]  He presents today without complaint.  He denies any increased pain to the right foot.  He denies any drainage from either the skin graft donor site or graft site.  The patient denies any fever, nausea vomiting.   1. Atherosclerosis of native arteries of the extremities with ulceration (HCC) s/p right foot skin graft site: Stable Right foot skin graft site continues to heal.  Healthy granulation tissue noted in the wound bed. Right thigh donor site continues to heal well Patient encouraged to elevate his right lower extremity for the mild edema noted to the right foot. The patient is to follow-up in 1 month for a wound check. If the patient notes any changes to his wound he should call the office sooner  2. Type 2 diabetes mellitus with diabetic peripheral angiopathy and gangrene, with long-term current use of insulin (HCC) - Stable Encouraged good control as its slows the progression of atherosclerotic disease  3. Mixed hyperlipidemia - Stable Encouraged good control as its slows the progression of atherosclerotic disease  Current Outpatient Medications on File Prior to Visit  Medication Sig Dispense Refill  . atorvastatin (LIPITOR) 20 MG tablet Take 20 mg by mouth daily.    Marland Kitchen. docusate sodium (COLACE) 100 MG  capsule Take 1 capsule (100 mg total) by mouth 2 (two) times daily. 10 capsule 0  . insulin glargine (LANTUS) 100 UNIT/ML injection Inject 0.15 mLs (15 Units total) into the skin at bedtime. 10 mL 11  . insulin lispro (HUMALOG) 100 UNIT/ML injection Inject 4 Units into the skin See admin instructions. Inject 4 units SQ with breakfast, inject 4 units SQ with lunch and inject 4 units SQ with dinner    . metFORMIN (GLUCOPHAGE) 500 MG tablet Take 500 mg by mouth 2 (two) times daily with a meal.     . Multiple Vitamins-Minerals (CENTROVITE) TABS Take 1 tablet by mouth daily.    Marland Kitchen. oxyCODONE (OXY IR/ROXICODONE) 5  MG immediate release tablet Take 1 tablet (5 mg total) by mouth every 4 (four) hours as needed for moderate pain. 20 tablet 0  . polyethylene glycol (MIRALAX / GLYCOLAX) packet Take 17 g by mouth daily.    . rivaroxaban (XARELTO) 20 MG TABS tablet Take 20 mg by mouth daily with supper.    . senna (SENOKOT) 8.6 MG TABS tablet Take 1 tablet by mouth 2 (two) times daily.    . Simethicone (GAS-X ULTRA STRENGTH PO) Take 1 capsule by mouth 4 (four) times daily as needed (for flatulence).     No current facility-administered medications on file prior to visit.    There are no Patient Instructions on file for this visit. No Follow-up on file.  [name] A Abbegayle Denault, PA-C

## 2017-08-03 ENCOUNTER — Telehealth (INDEPENDENT_AMBULATORY_CARE_PROVIDER_SITE_OTHER): Payer: Self-pay

## 2017-08-03 NOTE — Telephone Encounter (Signed)
That sounds fine

## 2017-08-03 NOTE — Telephone Encounter (Signed)
Elease Hashimotoatricia from [facility] called and stated that she picked up this patient over the weekend, and she wouls like to know if she should continue the same dressing change that was being provided [facility]: 1. Clean wound area 2. Oil Emulsion gauze 3. Kurlex 4. Ace bandage  Please Advise

## 2017-08-03 NOTE — Telephone Encounter (Signed)
Called the nurse back to let her know that Selena BattenKim said that it's fine for her to follow the same dressing changes that the patient was getting [facility].

## 2017-08-20 ENCOUNTER — Ambulatory Visit (INDEPENDENT_AMBULATORY_CARE_PROVIDER_SITE_OTHER): Payer: Medicare HMO | Admitting: Vascular Surgery

## 2017-08-20 ENCOUNTER — Encounter (INDEPENDENT_AMBULATORY_CARE_PROVIDER_SITE_OTHER): Payer: Self-pay | Admitting: Vascular Surgery

## 2017-08-20 VITALS — BP 116/67 | HR 77 | Resp 16 | Wt 260.0 lb

## 2017-08-20 DIAGNOSIS — L97514 Non-pressure chronic ulcer of other part of right foot with necrosis of bone: Secondary | ICD-10-CM

## 2017-08-20 DIAGNOSIS — Z794 Long term (current) use of insulin: Secondary | ICD-10-CM

## 2017-08-20 DIAGNOSIS — I7025 Atherosclerosis of native arteries of other extremities with ulceration: Secondary | ICD-10-CM

## 2017-08-20 DIAGNOSIS — E1152 Type 2 diabetes mellitus with diabetic peripheral angiopathy with gangrene: Secondary | ICD-10-CM

## 2017-08-20 DIAGNOSIS — E782 Mixed hyperlipidemia: Secondary | ICD-10-CM

## 2017-08-20 MED ORDER — BACITRACIN ZINC 500 UNIT/GM EX OINT: TOPICAL_OINTMENT | CUTANEOUS | 3 refills | Status: DC

## 2017-08-20 NOTE — Progress Notes (Signed)
MRN : [id number]  [name] is a 66 y.o. (10-04-1950) male who presents with chief complaint of  Chief Complaint  Patient presents with  . Wound Check    71month wound follow up  .  History of Present Illness: Patient presents for a wound check.  The patient was last seen about 4 weeks ago for a skin graft wound check.  He presents today with a aide [facility].  He presents today without complaint.  He denies any increased pain to the right foot.  He denies any drainage from either the skin graft donor site or graft site.  The patient denies any fever, nausea vomiting.     Current Meds  Medication Sig  . acetaminophen (TYLENOL) 325 MG tablet Take 650 mg by mouth every 6 (six) hours as needed.  Marland Kitchen atorvastatin (LIPITOR) 20 MG tablet Take 20 mg by mouth daily.  Marland Kitchen docusate sodium (COLACE) 100 MG capsule Take 1 capsule (100 mg total) by mouth 2 (two) times daily.  . insulin glargine (LANTUS) 100 UNIT/ML injection Inject 0.15 mLs (15 Units total) into the skin at bedtime.  . insulin lispro (HUMALOG) 100 UNIT/ML injection Inject 4 Units into the skin See admin instructions. Inject 4 units SQ with breakfast, inject 4 units SQ with lunch and inject 4 units SQ with dinner  . lisinopril-hydrochlorothiazide (PRINZIDE,ZESTORETIC) 20-25 MG tablet Take by mouth.  . metFORMIN (GLUCOPHAGE) 500 MG tablet Take 500 mg by mouth 2 (two) times daily with a meal.   . Multiple Vitamins-Minerals (CENTROVITE) TABS Take 1 tablet by mouth daily.  Marland Kitchen oxyCODONE (OXY IR/ROXICODONE) 5 MG immediate release tablet Take 1 tablet (5 mg total) by mouth every 4 (four) hours as needed for moderate pain.  . polyethylene glycol (MIRALAX / GLYCOLAX) packet Take 17 g by mouth daily.  . rivaroxaban (XARELTO) 20 MG TABS tablet Take 20 mg by mouth daily with supper.  . senna (SENOKOT) 8.6 MG TABS tablet Take 1 tablet by mouth 2 (two) times daily.  . Simethicone (GAS-X ULTRA STRENGTH PO) Take 1 capsule by mouth 4 (four)  times daily as needed (for flatulence).    Past Medical History:  Diagnosis Date  . Arthritis   . Bladder incontinence   . Chronic kidney disease    Stage 3 Kidney disease  . Diabetes mellitus without complication (HCC)   . Dyspnea    occassional  . Hyperlipidemia   . Hypertension   . Peripheral vascular disease (HCC)   . Sepsis [facility])     Past Surgical History:  Procedure Laterality Date  . ABDOMINAL AORTOGRAM W/LOWER EXTREMITY N/A 10/10/2016   Procedure: Abdominal Aortogram w/Lower Extremity;  Surgeon: Renford Dills, MD;  Location: [facility] INVASIVE CV LAB;  Service: Cardiovascular;  Laterality: N/A;  . CIRCUMCISION  1998  . EYE SURGERY Left    Retina Detachment  . GRAFT APPLICATION Right 05/29/2017   Procedure: GRAFT APPLICATION ( RIGHT FOOT );  Surgeon: Renford Dills, MD;  Location: [facility] ORS;  Service: Vascular;  Laterality: Right;  graft taken from patients right thigh  . IRRIGATION AND DEBRIDEMENT FOOT Right 10/03/2016   Procedure: IRRIGATION AND DEBRIDEMENT FOOT;  Surgeon: Gwyneth Revels, DPM;  Location: [facility] ORS;  Service: Podiatry;  Laterality: Right;  . LOWER EXTREMITY INTERVENTION  10/10/2016   Procedure: Lower Extremity Intervention;  Surgeon: Renford Dills, MD;  Location: [facility] INVASIVE CV LAB;  Service: Cardiovascular;;  . PERIPHERAL VASCULAR BALLOON ANGIOPLASTY Left 10/10/2016   Procedure: Peripheral  Vascular Balloon Angioplasty;  Surgeon: Renford DillsGregory G Schnier, MD;  Location: [facility] INVASIVE CV LAB;  Service: Cardiovascular;  Laterality: Left;  . TONSILLECTOMY    . WOUND DEBRIDEMENT Right 11/07/2016   Procedure: DEBRIDEMENT OF WOUND AND BONE RIGHT FOOT AND APPLY WOUND VAC;  Surgeon: Gwyneth RevelsJustin Fowler, DPM;  Location: [facility] ORS;  Service: Podiatry;  Laterality: Right;    Social History Social History   Tobacco Use  . Smoking status: Former Smoker    Types: Pipe    Last attempt to quit: 05/23/1975    Years since quitting: 42.2  . Smokeless tobacco: Never Used  Substance  Use Topics  . Alcohol use: No  . Drug use: No    Family History Family History  Problem Relation Age of Onset  . Diabetes Mother   . Hypertension Mother   . Diabetes Father   . Hypertension Father     Allergies  Allergen Reactions  . Sulfa Antibiotics Rash  . Zosyn [Piperacillin Sod-Tazobactam So] Rash     REVIEW OF SYSTEMS (Negative unless checked)  Constitutional: [] Weight loss  [] Fever  [] Chills Cardiac: [] Chest pain   [] Chest pressure   [] Palpitations   [] Shortness of breath when laying flat   [] Shortness of breath with exertion. Vascular:  [] Pain in legs with walking   [] Pain in legs at rest  [] History of DVT   [] Phlebitis   [] Swelling in legs   [] Varicose veins   [] Non-healing ulcers Pulmonary:   [] Uses home oxygen   [] Productive cough   [] Hemoptysis   [] Wheeze  [] COPD   [] Asthma Neurologic:  [] Dizziness   [] Seizures   [] History of stroke   [] History of TIA  [] Aphasia   [] Vissual changes   [] Weakness or numbness in arm   [] Weakness or numbness in leg Musculoskeletal:   [] Joint swelling   [] Joint pain   [] Low back pain Hematologic:  [] Easy bruising  [] Easy bleeding   [] Hypercoagulable state   [] Anemic Gastrointestinal:  [] Diarrhea   [] Vomiting  [] Gastroesophageal reflux/heartburn   [] Difficulty swallowing. Genitourinary:  [] Chronic kidney disease   [] Difficult urination  [] Frequent urination   [] Blood in urine Skin:  [] Rashes   [x] Ulcers  Psychological:  [] History of anxiety   []  History of major depression.  Physical Examination  Vitals:   08/20/17 1345  BP: 116/67  Pulse: 77  Resp: 16  Weight: 117.9 kg (260 lb)   Body mass index is 35.26 kg/m. Gen: WD/WN, NAD Head: [place]/AT, No temporalis wasting.  Ear/Nose/Throat: Hearing grossly intact, nares w/o erythema or drainage Eyes: PER, EOMI, sclera nonicteric.  Neck: Supple, no large masses.   Pulmonary:  Good air movement, no audible wheezing bilaterally, no use of accessory muscles.  Cardiac: RRR, no JVD Vascular:   Moderate venous stasis changes to the legs bilaterally.  3-4+ soft pitting edema.  The right forefoot wound continues to heal.  There appears to be 90% take from his skin grafting procedure Vessel Right Left  Radial Palpable Palpable  PT Not Palpable Not Palpable  DP Not Palpable Not Palpable  Gastrointestinal: Non-distended. No guarding/no peritoneal signs.  Musculoskeletal: M/S 5/5 throughout.  No deformity or atrophy.  Neurologic: CN 2-12 intact. Symmetrical.  Speech is fluent. Motor exam as listed above. Psychiatric: Judgment intact, Mood & affect appropriate for pt's clinical situation. Dermatologic: + rashes and ulcers noted.  No changes consistent with cellulitis. Lymph : No lichenification or skin changes of chronic lymphedema.  CBC Lab Results  Component Value Date   WBC 5.3 05/22/2017   HGB  13.4 05/22/2017   HCT 39.8 (L) 05/22/2017   MCV 83.3 05/22/2017   PLT 266 05/22/2017    BMET    Component Value Date/Time   NA 139 05/22/2017 0853   NA 140 04/04/2013 1238   K 4.4 05/22/2017 0853   K 4.1 04/04/2013 1238   CL 104 05/22/2017 0853   CL 108 (H) 04/04/2013 1238   CO2 28 05/22/2017 0853   CO2 30 04/04/2013 1238   GLUCOSE 90 05/22/2017 0853   GLUCOSE 98 04/04/2013 1238   BUN 13 05/22/2017 0853   BUN 19 (H) 04/04/2013 1238   CREATININE 0.91 05/22/2017 0853   CREATININE 0.87 04/04/2013 1238   CALCIUM 9.3 05/22/2017 0853   CALCIUM 8.8 04/04/2013 1238   GFRNONAA >60 05/22/2017 0853   GFRNONAA >60 04/04/2013 1238   GFRAA >60 05/22/2017 0853   GFRAA >60 04/04/2013 1238   CrCl cannot be calculated (Patient's most recent lab result is older than the maximum 21 days allowed.).  COAG Lab Results  Component Value Date   INR 2.28 05/22/2017   INR 1.00 11/30/2016    Radiology No results found.  Assessment/Plan 1. 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  Controlling his edema and beginning to use light to medium  compression was extensively discussed with the patient  2. Atherosclerosis of native arteries of the extremities with ulceration (HCC) We will continue to follow noninvasive studies will be obtained in 2-3 months.  3. Mixed hyperlipidemia Continue statin as ordered and reviewed, no changes at this time   4. Type 2 diabetes mellitus with diabetic peripheral angiopathy and gangrene, with long-term current use of insulin (HCC) Continue hypoglycemic medications as already ordered, these medications have been reviewed and there are no changes at this time.  Hgb A1C to be monitored as already arranged by primary service    Levora DredgeGregory Schnier, MD  08/20/2017 2:03 PM

## 2017-08-23 ENCOUNTER — Encounter (INDEPENDENT_AMBULATORY_CARE_PROVIDER_SITE_OTHER): Payer: Self-pay | Admitting: Vascular Surgery

## 2017-09-21 ENCOUNTER — Encounter (INDEPENDENT_AMBULATORY_CARE_PROVIDER_SITE_OTHER): Payer: Self-pay | Admitting: Vascular Surgery

## 2017-09-21 ENCOUNTER — Ambulatory Visit (INDEPENDENT_AMBULATORY_CARE_PROVIDER_SITE_OTHER): Payer: Medicare HMO | Admitting: Vascular Surgery

## 2017-09-21 VITALS — BP 117/64 | HR 90 | Resp 16 | Wt 268.0 lb

## 2017-09-21 DIAGNOSIS — E782 Mixed hyperlipidemia: Secondary | ICD-10-CM | POA: Diagnosis not present

## 2017-09-21 DIAGNOSIS — L97514 Non-pressure chronic ulcer of other part of right foot with necrosis of bone: Secondary | ICD-10-CM | POA: Diagnosis not present

## 2017-09-21 DIAGNOSIS — Z794 Long term (current) use of insulin: Secondary | ICD-10-CM | POA: Diagnosis not present

## 2017-09-21 DIAGNOSIS — E1152 Type 2 diabetes mellitus with diabetic peripheral angiopathy with gangrene: Secondary | ICD-10-CM | POA: Diagnosis not present

## 2017-09-21 DIAGNOSIS — I7025 Atherosclerosis of native arteries of other extremities with ulceration: Secondary | ICD-10-CM | POA: Diagnosis not present

## 2017-09-21 NOTE — Progress Notes (Signed)
MRN : [id number]  [name] is a 67 y.o. (23-May-1951) male who presents with chief complaint of  Chief Complaint  Patient presents with  . Follow-up    1 month check up  .  History of Present Illness: Patient presents for a wound check. The patient was last seen about 4 weeks ago for a skin graft wound check. He presents today with a aide from [facility].   He is still struggling wth edema.  He was recently seen at the [name] by Dr Terrilee Croak and started on Doxycycline for cellulitis.  He feels his foot is much better. He denies any drainage from either the skin graft donor site or graft site. The patient denies any fever, nausea vomiting.     Current Meds  Medication Sig  . acetaminophen (TYLENOL) 325 MG tablet Take 650 mg by mouth every 6 (six) hours as needed.  Marland Kitchen atorvastatin (LIPITOR) 20 MG tablet Take 20 mg by mouth daily.  . bacitracin ointment Apply in a thin layer to the wound of the right foot and cover with gauze and a wrap  Do this daily (once per day)  . docusate sodium (COLACE) 100 MG capsule Take 1 capsule (100 mg total) by mouth 2 (two) times daily.  Marland Kitchen doxycycline (DORYX) 100 MG EC tablet Take 100 mg by mouth 2 (two) times daily.  . insulin glargine (LANTUS) 100 UNIT/ML injection Inject 0.15 mLs (15 Units total) into the skin at bedtime.  . insulin lispro (HUMALOG) 100 UNIT/ML injection Inject 4 Units into the skin See admin instructions. Inject 4 units SQ with breakfast, inject 4 units SQ with lunch and inject 4 units SQ with dinner  . lisinopril-hydrochlorothiazide (PRINZIDE,ZESTORETIC) 20-25 MG tablet Take by mouth.  . metFORMIN (GLUCOPHAGE) 500 MG tablet Take 500 mg by mouth 2 (two) times daily with a meal.   . Multiple Vitamins-Minerals (CENTROVITE) TABS Take 1 tablet by mouth daily.  Marland Kitchen oxyCODONE (OXY IR/ROXICODONE) 5 MG immediate release tablet Take 1 tablet (5 mg total) by mouth every 4 (four) hours as needed for moderate pain.  . polyethylene  glycol (MIRALAX / GLYCOLAX) packet Take 17 g by mouth daily.  . rivaroxaban (XARELTO) 20 MG TABS tablet Take 20 mg by mouth daily with supper.  . senna (SENOKOT) 8.6 MG TABS tablet Take 1 tablet by mouth 2 (two) times daily.  . Simethicone (GAS-X ULTRA STRENGTH PO) Take 1 capsule by mouth 4 (four) times daily as needed (for flatulence).    Past Medical History:  Diagnosis Date  . Arthritis   . Bladder incontinence   . Chronic kidney disease    Stage 3 Kidney disease  . Diabetes mellitus without complication (HCC)   . Dyspnea    occassional  . Hyperlipidemia   . Hypertension   . Peripheral vascular disease (HCC)   . Sepsis [facility])     Past Surgical History:  Procedure Laterality Date  . ABDOMINAL AORTOGRAM W/LOWER EXTREMITY N/A 10/10/2016   Procedure: Abdominal Aortogram w/Lower Extremity;  Surgeon: Renford Dills, MD;  Location: [facility] INVASIVE CV LAB;  Service: Cardiovascular;  Laterality: N/A;  . CIRCUMCISION  1998  . EYE SURGERY Left    Retina Detachment  . GRAFT APPLICATION Right 05/29/2017   Procedure: GRAFT APPLICATION ( RIGHT FOOT );  Surgeon: Renford Dills, MD;  Location: [facility] ORS;  Service: Vascular;  Laterality: Right;  graft taken from patients right thigh  . IRRIGATION AND DEBRIDEMENT FOOT Right 10/03/2016  Procedure: IRRIGATION AND DEBRIDEMENT FOOT;  Surgeon: Gwyneth RevelsJustin Fowler, DPM;  Location: [facility] ORS;  Service: Podiatry;  Laterality: Right;  . LOWER EXTREMITY INTERVENTION  10/10/2016   Procedure: Lower Extremity Intervention;  Surgeon: Renford DillsGregory G Schnier, MD;  Location: [facility] INVASIVE CV LAB;  Service: Cardiovascular;;  . PERIPHERAL VASCULAR BALLOON ANGIOPLASTY Left 10/10/2016   Procedure: Peripheral Vascular Balloon Angioplasty;  Surgeon: Renford DillsGregory G Schnier, MD;  Location: [facility] INVASIVE CV LAB;  Service: Cardiovascular;  Laterality: Left;  . TONSILLECTOMY    . WOUND DEBRIDEMENT Right 11/07/2016   Procedure: DEBRIDEMENT OF WOUND AND BONE RIGHT FOOT AND APPLY WOUND VAC;   Surgeon: Gwyneth RevelsJustin Fowler, DPM;  Location: [facility] ORS;  Service: Podiatry;  Laterality: Right;    Social History Social History   Tobacco Use  . Smoking status: Former Smoker    Types: Pipe    Last attempt to quit: 05/23/1975    Years since quitting: 42.3  . Smokeless tobacco: Never Used  Substance Use Topics  . Alcohol use: No  . Drug use: No    Family History Family History  Problem Relation Age of Onset  . Diabetes Mother   . Hypertension Mother   . Diabetes Father   . Hypertension Father     Allergies  Allergen Reactions  . Sulfa Antibiotics Rash  . Zosyn [Piperacillin Sod-Tazobactam So] Rash     REVIEW OF SYSTEMS (Negative unless checked)  Constitutional: [] Weight loss  [] Fever  [] Chills Cardiac: [] Chest pain   [] Chest pressure   [] Palpitations   [] Shortness of breath when laying flat   [] Shortness of breath with exertion. Vascular:  [] Pain in legs with walking   [] Pain in legs at rest  [] History of DVT   [] Phlebitis   [x] Swelling in legs   [] Varicose veins   [] Non-healing ulcers Pulmonary:   [] Uses home oxygen   [] Productive cough   [] Hemoptysis   [] Wheeze  [] COPD   [] Asthma Neurologic:  [] Dizziness   [] Seizures   [] History of stroke   [] History of TIA  [] Aphasia   [] Vissual changes   [] Weakness or numbness in arm   [] Weakness or numbness in leg Musculoskeletal:   [] Joint swelling   [] Joint pain   [] Low back pain Hematologic:  [] Easy bruising  [] Easy bleeding   [] Hypercoagulable state   [] Anemic Gastrointestinal:  [] Diarrhea   [] Vomiting  [] Gastroesophageal reflux/heartburn   [] Difficulty swallowing. Genitourinary:  [] Chronic kidney disease   [] Difficult urination  [] Frequent urination   [] Blood in urine Skin:  [] Rashes   [] Ulcers  Psychological:  [] History of anxiety   []  History of major depression.  Physical Examination  Vitals:   09/21/17 1328  BP: 117/64  Pulse: 90  Resp: 16  Weight: 268 lb (121.6 kg)   Body mass index is 36.35 kg/m. Gen: WD/WN,  NAD Head: [place]/AT, No temporalis wasting.  Ear/Nose/Throat: Hearing grossly intact, nares w/o erythema or drainage Eyes: PER, EOMI, sclera nonicteric.  Neck: Supple, no large masses.   Pulmonary:  Good air movement, no audible wheezing bilaterally, no use of accessory muscles.  Cardiac: RRR, no JVD Vascular:  There appears to be complete epithelialization of the lateral right foot wound.  3+ edema of the dorsum of the foot and ankle. Vessel Right Left  Radial Palpable Palpable  PT Not Palpable Not Palpable  DP Not Palpable Not Palpable  Gastrointestinal: Non-distended. No guarding/no peritoneal signs.  Musculoskeletal: M/S 5/5 throughout.  No deformity or atrophy.  Neurologic: CN 2-12 intact. Symmetrical.  Speech is fluent. Motor exam as listed  above. Psychiatric: Judgment intact, Mood & affect appropriate for pt's clinical situation. Dermatologic: venous rashes no ulcers noted.  No changes consistent with cellulitis. Lymph : No lichenification or skin changes of chronic lymphedema.  CBC Lab Results  Component Value Date   WBC 5.3 05/22/2017   HGB 13.4 05/22/2017   HCT 39.8 (L) 05/22/2017   MCV 83.3 05/22/2017   PLT 266 05/22/2017    BMET    Component Value Date/Time   NA 139 05/22/2017 0853   NA 140 04/04/2013 1238   K 4.4 05/22/2017 0853   K 4.1 04/04/2013 1238   CL 104 05/22/2017 0853   CL 108 (H) 04/04/2013 1238   CO2 28 05/22/2017 0853   CO2 30 04/04/2013 1238   GLUCOSE 90 05/22/2017 0853   GLUCOSE 98 04/04/2013 1238   BUN 13 05/22/2017 0853   BUN 19 (H) 04/04/2013 1238   CREATININE 0.91 05/22/2017 0853   CREATININE 0.87 04/04/2013 1238   CALCIUM 9.3 05/22/2017 0853   CALCIUM 8.8 04/04/2013 1238   GFRNONAA >60 05/22/2017 0853   GFRNONAA >60 04/04/2013 1238   GFRAA >60 05/22/2017 0853   GFRAA >60 04/04/2013 1238   CrCl cannot be calculated (Patient's most recent lab result is older than the maximum 21 days allowed.).  COAG Lab Results  Component Value Date    INR 2.28 05/22/2017   INR 1.00 11/30/2016    Radiology No results found.    Assessment/Plan 1. Atherosclerosis of native arteries of the extremities with ulceration (HCC) No surgery or intervention at this point in time.    I have had a long discussion with the patient regarding venous insufficiency and why it  causes symptoms, specifically venous ulceration . I have discussed with the patient the chronic skin changes that accompany venous insufficiency and the long term sequela such as infection and recurring  ulceration.  Patient will be placed in [employer] which will be changed weekly drainage permitting.  In addition, behavioral modification including several periods of elevation of the lower extremities during the day will be continued. Achieving a position with the ankles at heart level was stressed to the patient  The patient is instructed to begin routine exercise, especially walking on a daily basis  Following the review of the ultrasound the patient will follow up in one week to reassess the degree of swelling and the control that Unna therapy is offering.   The patient can be assessed for graduated compression stockings or wraps as well as a Lymph Pump once the ulcers are healed.   2. 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 Type 2 diabetes mellitus with diabetic peripheral angiopathy and gangrene, with long-term current use of insulin (HCC) Continue hypoglycemic medications as already ordered, these medications have been reviewed and there are no changes at this time.  Hgb A1C to be monitored as already arranged by primary service   4. Mixed hyperlipidemia Continue statin as ordered and reviewed, no changes at this time     Levora Dredge, MD  09/21/2017 1:36 PM

## 2017-09-28 ENCOUNTER — Encounter (INDEPENDENT_AMBULATORY_CARE_PROVIDER_SITE_OTHER): Payer: Self-pay

## 2017-09-28 ENCOUNTER — Ambulatory Visit (INDEPENDENT_AMBULATORY_CARE_PROVIDER_SITE_OTHER): Payer: Medicare HMO | Admitting: Vascular Surgery

## 2017-09-28 DIAGNOSIS — L089 Local infection of the skin and subcutaneous tissue, unspecified: Secondary | ICD-10-CM | POA: Insufficient documentation

## 2017-09-28 DIAGNOSIS — I83009 Varicose veins of unspecified lower extremity with ulcer of unspecified site: Secondary | ICD-10-CM | POA: Diagnosis not present

## 2017-09-28 DIAGNOSIS — L97909 Non-pressure chronic ulcer of unspecified part of unspecified lower leg with unspecified severity: Secondary | ICD-10-CM | POA: Diagnosis not present

## 2017-09-28 NOTE — Progress Notes (Signed)
History of Present Illness  There is no documented history at this time  Assessments & Plan   There are no diagnoses linked to this encounter.    Additional instructions  Subjective:  Patient presents with venous ulcer of the Right lower extremity.    Procedure:  3 layer unna wrap was placed Right lower extremity.   Plan:   Follow up in one week.   

## 2017-10-05 ENCOUNTER — Ambulatory Visit (INDEPENDENT_AMBULATORY_CARE_PROVIDER_SITE_OTHER): Payer: Medicare HMO | Admitting: Vascular Surgery

## 2017-10-05 ENCOUNTER — Encounter (INDEPENDENT_AMBULATORY_CARE_PROVIDER_SITE_OTHER): Payer: Self-pay

## 2017-10-05 VITALS — HR 88 | Resp 18 | Ht 70.0 in | Wt 268.0 lb

## 2017-10-05 DIAGNOSIS — L97909 Non-pressure chronic ulcer of unspecified part of unspecified lower leg with unspecified severity: Secondary | ICD-10-CM

## 2017-10-05 DIAGNOSIS — I83009 Varicose veins of unspecified lower extremity with ulcer of unspecified site: Secondary | ICD-10-CM | POA: Diagnosis not present

## 2017-10-05 NOTE — Progress Notes (Signed)
History of Present Illness  There is no documented history at this time  Assessments & Plan   There are no diagnoses linked to this encounter.    Additional instructions  Subjective:  Patient presents with venous ulcer of the Right lower extremity.    Procedure:  3 layer unna wrap was placed Right lower extremity.   Plan:   Follow up in one week.   

## 2017-10-12 ENCOUNTER — Ambulatory Visit (INDEPENDENT_AMBULATORY_CARE_PROVIDER_SITE_OTHER): Payer: Medicare HMO | Admitting: Vascular Surgery

## 2017-10-12 ENCOUNTER — Encounter (INDEPENDENT_AMBULATORY_CARE_PROVIDER_SITE_OTHER): Payer: Self-pay

## 2017-10-12 VITALS — BP 131/72 | HR 76 | Resp 17

## 2017-10-12 DIAGNOSIS — L97514 Non-pressure chronic ulcer of other part of right foot with necrosis of bone: Secondary | ICD-10-CM | POA: Diagnosis not present

## 2017-10-12 NOTE — Progress Notes (Signed)
History of Present Illness  There is no documented history at this time  Assessments & Plan   There are no diagnoses linked to this encounter.    Additional instructions  Subjective:  Patient presents with venous ulcer of the Right lower extremity.    Procedure:  3 layer unna wrap was placed Right lower extremity.   Plan:   Follow up in one week.   

## 2017-10-14 ENCOUNTER — Encounter (INDEPENDENT_AMBULATORY_CARE_PROVIDER_SITE_OTHER): Payer: Self-pay | Admitting: Vascular Surgery

## 2017-10-19 ENCOUNTER — Encounter (INDEPENDENT_AMBULATORY_CARE_PROVIDER_SITE_OTHER): Payer: Self-pay | Admitting: Vascular Surgery

## 2017-10-19 ENCOUNTER — Ambulatory Visit (INDEPENDENT_AMBULATORY_CARE_PROVIDER_SITE_OTHER): Payer: Medicare HMO | Admitting: Vascular Surgery

## 2017-10-19 VITALS — BP 154/71 | HR 66 | Resp 16 | Ht 71.0 in | Wt 285.0 lb

## 2017-10-19 DIAGNOSIS — I83015 Varicose veins of right lower extremity with ulcer other part of foot: Secondary | ICD-10-CM | POA: Diagnosis not present

## 2017-10-19 DIAGNOSIS — E1152 Type 2 diabetes mellitus with diabetic peripheral angiopathy with gangrene: Secondary | ICD-10-CM | POA: Diagnosis not present

## 2017-10-19 DIAGNOSIS — Z794 Long term (current) use of insulin: Secondary | ICD-10-CM | POA: Diagnosis not present

## 2017-10-19 DIAGNOSIS — I7025 Atherosclerosis of native arteries of other extremities with ulceration: Secondary | ICD-10-CM | POA: Diagnosis not present

## 2017-10-19 DIAGNOSIS — L97819 Non-pressure chronic ulcer of other part of right lower leg with unspecified severity: Secondary | ICD-10-CM | POA: Diagnosis not present

## 2017-10-19 DIAGNOSIS — E782 Mixed hyperlipidemia: Secondary | ICD-10-CM

## 2017-10-19 DIAGNOSIS — L97909 Non-pressure chronic ulcer of unspecified part of unspecified lower leg with unspecified severity: Secondary | ICD-10-CM | POA: Diagnosis not present

## 2017-10-19 DIAGNOSIS — I83009 Varicose veins of unspecified lower extremity with ulcer of unspecified site: Secondary | ICD-10-CM | POA: Diagnosis not present

## 2017-10-19 NOTE — Progress Notes (Signed)
MRN : [id number]  [name] is a 67 y.o. (09-Mar-1951) male who presents with chief complaint of  Chief Complaint  Patient presents with  . Follow-up    Unna boot check  .  History of Present Illness: Patient presents for a wound check. The patient was last seen about4weeks ago for follow up regarding a skin graft done on 05/29/2017. He presents today for evaluation.   He is  Doing much better with respect to his edema.  No further cellulitis.  He feels his foot is much better. He denies any drainage from either the skin graft donor site or graft site. The patient denies any fever, nausea vomiting.    No outpatient medications have been marked as taking for the 10/19/17 encounter (Office Visit) with [name], [name], MD.    Past Medical History:  Diagnosis Date  . Arthritis   . Bladder incontinence   . Chronic kidney disease    Stage 3 Kidney disease  . Diabetes mellitus without complication (HCC)   . Dyspnea    occassional  . Hyperlipidemia   . Hypertension   . Peripheral vascular disease (HCC)   . Sepsis [facility])     Past Surgical History:  Procedure Laterality Date  . ABDOMINAL AORTOGRAM W/LOWER EXTREMITY N/A 10/10/2016   Procedure: Abdominal Aortogram w/Lower Extremity;  Surgeon: Renford Dills, MD;  Location: [facility] INVASIVE CV LAB;  Service: Cardiovascular;  Laterality: N/A;  . CIRCUMCISION  1998  . EYE SURGERY Left    Retina Detachment  . GRAFT APPLICATION Right 05/29/2017   Procedure: GRAFT APPLICATION ( RIGHT FOOT );  Surgeon: Renford Dills, MD;  Location: [facility] ORS;  Service: Vascular;  Laterality: Right;  graft taken from patients right thigh  . IRRIGATION AND DEBRIDEMENT FOOT Right 10/03/2016   Procedure: IRRIGATION AND DEBRIDEMENT FOOT;  Surgeon: Gwyneth Revels, DPM;  Location: [facility] ORS;  Service: Podiatry;  Laterality: Right;  . LOWER EXTREMITY INTERVENTION  10/10/2016   Procedure: Lower Extremity Intervention;  Surgeon: Renford Dills, MD;   Location: [facility] INVASIVE CV LAB;  Service: Cardiovascular;;  . PERIPHERAL VASCULAR BALLOON ANGIOPLASTY Left 10/10/2016   Procedure: Peripheral Vascular Balloon Angioplasty;  Surgeon: Renford Dills, MD;  Location: [facility] INVASIVE CV LAB;  Service: Cardiovascular;  Laterality: Left;  . TONSILLECTOMY    . WOUND DEBRIDEMENT Right 11/07/2016   Procedure: DEBRIDEMENT OF WOUND AND BONE RIGHT FOOT AND APPLY WOUND VAC;  Surgeon: Gwyneth Revels, DPM;  Location: [facility] ORS;  Service: Podiatry;  Laterality: Right;    Social History Social History   Tobacco Use  . Smoking status: Former Smoker    Types: Pipe    Last attempt to quit: 05/23/1975    Years since quitting: 42.4  . Smokeless tobacco: Never Used  Substance Use Topics  . Alcohol use: No  . Drug use: No    Family History Family History  Problem Relation Age of Onset  . Diabetes Mother   . Hypertension Mother   . Diabetes Father   . Hypertension Father     Allergies  Allergen Reactions  . Sulfa Antibiotics Rash  . Zosyn [Piperacillin Sod-Tazobactam So] Rash     REVIEW OF SYSTEMS (Negative unless checked)  Constitutional: [] Weight loss  [] Fever  [] Chills Cardiac: [] Chest pain   [] Chest pressure   [] Palpitations   [] Shortness of breath when laying flat   [] Shortness of breath with exertion. Vascular:  [] Pain in legs with walking   [] Pain in legs at rest  []   History of DVT   [] Phlebitis   [x] Swelling in legs   [] Varicose veins   [x] healing ulcers Pulmonary:   [] Uses home oxygen   [] Productive cough   [] Hemoptysis   [] Wheeze  [] COPD   [] Asthma Neurologic:  [] Dizziness   [] Seizures   [] History of stroke   [] History of TIA  [] Aphasia   [] Vissual changes   [] Weakness or numbness in arm   [] Weakness or numbness in leg Musculoskeletal:   [] Joint swelling   [] Joint pain   [] Low back pain Hematologic:  [] Easy bruising  [] Easy bleeding   [] Hypercoagulable state   [] Anemic Gastrointestinal:  [] Diarrhea   [] Vomiting  [] Gastroesophageal  reflux/heartburn   [] Difficulty swallowing. Genitourinary:  [] Chronic kidney disease   [] Difficult urination  [] Frequent urination   [] Blood in urine Skin:  [x] Rashes   [x] Ulcers  Psychological:  [] History of anxiety   []  History of major depression.  Physical Examination  Vitals:   10/19/17 1447  BP: (!) 154/71  Pulse: 66  Resp: 16  Weight: 285 lb (129.3 kg)  Height: 5\' 11"  (1.803 m)   Body mass index is 39.75 kg/m. Gen: WD/WN, NAD Head: [place]/AT, No temporalis wasting.  Ear/Nose/Throat: Hearing grossly intact, nares w/o erythema or drainage Eyes: PER, EOMI, sclera nonicteric.  Neck: Supple, no large masses.   Pulmonary:  Good air movement, no audible wheezing bilaterally, no use of accessory muscles.  Cardiac: RRR, no JVD Vascular: The lateral foot wound is clean and not infected it is down to about a dime size area.  scattered varicosities present bilaterally.  severe venous stasis changes to the legs bilaterally.  2-3+ soft pitting edema bilaterally Vessel Right Left  Radial Palpable Palpable  PT Palpable Palpable  DP Palpable Palpable  Gastrointestinal: Non-distended. No guarding/no peritoneal signs.  Musculoskeletal: M/S 5/5 throughout.  No deformity or atrophy.  Neurologic: CN 2-12 intact. Symmetrical.  Speech is fluent. Motor exam as listed above. Psychiatric: Judgment intact, Mood & affect appropriate for pt's clinical situation. Dermatologic: venous rashes or ulcers noted.  No changes consistent with cellulitis. Lymph : No lichenification or skin changes of chronic lymphedema.  CBC Lab Results  Component Value Date   WBC 5.3 05/22/2017   HGB 13.4 05/22/2017   HCT 39.8 (L) 05/22/2017   MCV 83.3 05/22/2017   PLT 266 05/22/2017    BMET    Component Value Date/Time   NA 139 05/22/2017 0853   NA 140 04/04/2013 1238   K 4.4 05/22/2017 0853   K 4.1 04/04/2013 1238   CL 104 05/22/2017 0853   CL 108 (H) 04/04/2013 1238   CO2 28 05/22/2017 0853   CO2 30 04/04/2013  1238   GLUCOSE 90 05/22/2017 0853   GLUCOSE 98 04/04/2013 1238   BUN 13 05/22/2017 0853   BUN 19 (H) 04/04/2013 1238   CREATININE 0.91 05/22/2017 0853   CREATININE 0.87 04/04/2013 1238   CALCIUM 9.3 05/22/2017 0853   CALCIUM 8.8 04/04/2013 1238   GFRNONAA >60 05/22/2017 0853   GFRNONAA >60 04/04/2013 1238   GFRAA >60 05/22/2017 0853   GFRAA >60 04/04/2013 1238   CrCl cannot be calculated (Patient's most recent lab result is older than the maximum 21 days allowed.).  COAG Lab Results  Component Value Date   INR 2.28 05/22/2017   INR 1.00 11/30/2016    Radiology No results found.   Assessment/Plan 1. Venous ulcer (HCC) No surgery or intervention at this point in time.    I have had a long discussion with the  patient regarding venous insufficiency and why it  causes symptoms, specifically venous ulceration . I have discussed with the patient the chronic skin changes that accompany venous insufficiency and the long term sequela such as infection and recurring  ulceration.  Patient will be placed in [employer] which will be changed weekly drainage permitting.  In addition, behavioral modification including several periods of elevation of the lower extremities during the day will be continued. Achieving a position with the ankles at heart level was stressed to the patient  The patient is instructed to begin routine exercise, especially walking on a daily basis  Patient should undergo duplex ultrasound of the venous system to ensure that DVT or reflux is not present.  Following the review of the ultrasound the patient will follow up in one week to reassess the degree of swelling and the control that Unna therapy is offering.   The patient can be assessed for graduated compression stockings or wraps as well as a Lymph Pump once the ulcers are healed.   2. Atherosclerosis of native arteries of the extremities with ulceration (HCC) See #1  3. Mixed hyperlipidemia Continue statin  as ordered and reviewed, no changes at this time   4. Type 2 diabetes mellitus with diabetic peripheral angiopathy and gangrene, with long-term current use of insulin (HCC) Continue hypoglycemic medications as already ordered, these medications have been reviewed and there are no changes at this time.  Hgb A1C to be monitored as already arranged by primary service     Levora Dredge, MD  10/19/2017 3:06 PM

## 2017-10-26 ENCOUNTER — Encounter (INDEPENDENT_AMBULATORY_CARE_PROVIDER_SITE_OTHER): Payer: Self-pay | Admitting: Vascular Surgery

## 2017-10-26 ENCOUNTER — Ambulatory Visit (INDEPENDENT_AMBULATORY_CARE_PROVIDER_SITE_OTHER): Payer: Medicare HMO | Admitting: Vascular Surgery

## 2017-10-26 VITALS — BP 129/69 | HR 90 | Resp 16 | Wt 287.0 lb

## 2017-10-26 DIAGNOSIS — I83009 Varicose veins of unspecified lower extremity with ulcer of unspecified site: Secondary | ICD-10-CM | POA: Diagnosis not present

## 2017-10-26 DIAGNOSIS — L97909 Non-pressure chronic ulcer of unspecified part of unspecified lower leg with unspecified severity: Secondary | ICD-10-CM

## 2017-10-26 NOTE — Progress Notes (Signed)
History of Present Illness  There is no documented history at this time  Assessments & Plan   There are no diagnoses linked to this encounter.    Additional instructions  Subjective:  Patient presents with venous ulcer of the Right lower extremity.    Procedure:  3 layer unna wrap was placed Right lower extremity.   Plan:   Follow up in one week.   

## 2017-11-02 ENCOUNTER — Encounter (INDEPENDENT_AMBULATORY_CARE_PROVIDER_SITE_OTHER): Payer: Self-pay

## 2017-11-02 ENCOUNTER — Ambulatory Visit (INDEPENDENT_AMBULATORY_CARE_PROVIDER_SITE_OTHER): Payer: Medicare HMO | Admitting: Vascular Surgery

## 2017-11-02 VITALS — BP 146/76 | Resp 17 | Ht 70.0 in | Wt 290.0 lb

## 2017-11-02 DIAGNOSIS — L97909 Non-pressure chronic ulcer of unspecified part of unspecified lower leg with unspecified severity: Secondary | ICD-10-CM | POA: Diagnosis not present

## 2017-11-02 DIAGNOSIS — I83009 Varicose veins of unspecified lower extremity with ulcer of unspecified site: Secondary | ICD-10-CM | POA: Diagnosis not present

## 2017-11-02 NOTE — Progress Notes (Signed)
History of Present Illness  There is no documented history at this time  Assessments & Plan   There are no diagnoses linked to this encounter.    Additional instructions  Subjective:  Patient presents with venous ulcer of the Right lower extremity.    Procedure:  3 layer unna wrap was placed Right lower extremity.   Plan:   Follow up in one week.   

## 2017-11-09 ENCOUNTER — Encounter (INDEPENDENT_AMBULATORY_CARE_PROVIDER_SITE_OTHER): Payer: Self-pay | Admitting: Vascular Surgery

## 2017-11-09 ENCOUNTER — Ambulatory Visit (INDEPENDENT_AMBULATORY_CARE_PROVIDER_SITE_OTHER): Payer: Medicare HMO | Admitting: Vascular Surgery

## 2017-11-09 VITALS — BP 144/72 | HR 81 | Resp 18 | Ht 70.0 in | Wt 285.0 lb

## 2017-11-09 DIAGNOSIS — I7025 Atherosclerosis of native arteries of other extremities with ulceration: Secondary | ICD-10-CM

## 2017-11-09 DIAGNOSIS — L97909 Non-pressure chronic ulcer of unspecified part of unspecified lower leg with unspecified severity: Secondary | ICD-10-CM | POA: Diagnosis not present

## 2017-11-09 DIAGNOSIS — Z794 Long term (current) use of insulin: Secondary | ICD-10-CM | POA: Diagnosis not present

## 2017-11-09 DIAGNOSIS — E1152 Type 2 diabetes mellitus with diabetic peripheral angiopathy with gangrene: Secondary | ICD-10-CM | POA: Diagnosis not present

## 2017-11-09 DIAGNOSIS — I83009 Varicose veins of unspecified lower extremity with ulcer of unspecified site: Secondary | ICD-10-CM | POA: Diagnosis not present

## 2017-11-09 DIAGNOSIS — E782 Mixed hyperlipidemia: Secondary | ICD-10-CM

## 2017-11-10 ENCOUNTER — Encounter (INDEPENDENT_AMBULATORY_CARE_PROVIDER_SITE_OTHER): Payer: Self-pay | Admitting: Vascular Surgery

## 2017-11-10 NOTE — Progress Notes (Signed)
MRN : [id number]  [name] is a 67 y.o. (May 04, 1951) male who presents with chief complaint of  Chief Complaint  Patient presents with  . Follow-up    Unna check  .  History of Present Illness: Patient presents for a wound check. The patient was last seen about4weeks ago for follow up regarding a skin graft done on 05/29/2017. He presents today for evaluation.   He is  Doing much better with respect to his edema.No further cellulitis. He feels his foot is much better.He denies any drainage from either the skin graft donor site or graft site. The patient denies any fever, nausea vomiting.    Current Meds  Medication Sig  . acetaminophen (TYLENOL) 325 MG tablet Take 650 mg by mouth every 6 (six) hours as needed.  Marland Kitchen atorvastatin (LIPITOR) 20 MG tablet Take 20 mg by mouth daily.  . bacitracin ointment Apply in a thin layer to the wound of the right foot and cover with gauze and a wrap  Do this daily (once per day)  . docusate sodium (COLACE) 100 MG capsule Take 1 capsule (100 mg total) by mouth 2 (two) times daily.  Marland Kitchen doxycycline (VIBRA-TABS) 100 MG tablet   . insulin glargine (LANTUS) 100 UNIT/ML injection Inject 0.15 mLs (15 Units total) into the skin at bedtime.  . insulin lispro (HUMALOG) 100 UNIT/ML injection Inject 4 Units into the skin See admin instructions. Inject 4 units SQ with breakfast, inject 4 units SQ with lunch and inject 4 units SQ with dinner  . lisinopril-hydrochlorothiazide (PRINZIDE,ZESTORETIC) 20-25 MG tablet Take by mouth.  . metFORMIN (GLUCOPHAGE) 500 MG tablet Take 500 mg by mouth 2 (two) times daily with a meal.   . Multiple Vitamins-Minerals (CENTROVITE) TABS Take 1 tablet by mouth daily.  Marland Kitchen oxyCODONE (OXY IR/ROXICODONE) 5 MG immediate release tablet Take 1 tablet (5 mg total) by mouth every 4 (four) hours as needed for moderate pain.  . polyethylene glycol (MIRALAX / GLYCOLAX) packet Take 17 g by mouth daily.  . rivaroxaban (XARELTO) 20  MG TABS tablet Take 20 mg by mouth daily with supper.  . senna (SENOKOT) 8.6 MG TABS tablet Take 1 tablet by mouth 2 (two) times daily.  . Simethicone (GAS-X ULTRA STRENGTH PO) Take 1 capsule by mouth 4 (four) times daily as needed (for flatulence).    Past Medical History:  Diagnosis Date  . Arthritis   . Bladder incontinence   . Chronic kidney disease    Stage 3 Kidney disease  . Diabetes mellitus without complication (HCC)   . Dyspnea    occassional  . Hyperlipidemia   . Hypertension   . Peripheral vascular disease (HCC)   . Sepsis [facility])     Past Surgical History:  Procedure Laterality Date  . ABDOMINAL AORTOGRAM W/LOWER EXTREMITY N/A 10/10/2016   Procedure: Abdominal Aortogram w/Lower Extremity;  Surgeon: Renford Dills, MD;  Location: [facility] INVASIVE CV LAB;  Service: Cardiovascular;  Laterality: N/A;  . CIRCUMCISION  1998  . EYE SURGERY Left    Retina Detachment  . GRAFT APPLICATION Right 05/29/2017   Procedure: GRAFT APPLICATION ( RIGHT FOOT );  Surgeon: Renford Dills, MD;  Location: [facility] ORS;  Service: Vascular;  Laterality: Right;  graft taken from patients right thigh  . IRRIGATION AND DEBRIDEMENT FOOT Right 10/03/2016   Procedure: IRRIGATION AND DEBRIDEMENT FOOT;  Surgeon: Gwyneth Revels, DPM;  Location: [facility] ORS;  Service: Podiatry;  Laterality: Right;  . LOWER EXTREMITY INTERVENTION  10/10/2016  Procedure: Lower Extremity Intervention;  Surgeon: Renford Dills, MD;  Location: [facility] INVASIVE CV LAB;  Service: Cardiovascular;;  . PERIPHERAL VASCULAR BALLOON ANGIOPLASTY Left 10/10/2016   Procedure: Peripheral Vascular Balloon Angioplasty;  Surgeon: Renford Dills, MD;  Location: [facility] INVASIVE CV LAB;  Service: Cardiovascular;  Laterality: Left;  . TONSILLECTOMY    . WOUND DEBRIDEMENT Right 11/07/2016   Procedure: DEBRIDEMENT OF WOUND AND BONE RIGHT FOOT AND APPLY WOUND VAC;  Surgeon: Gwyneth Revels, DPM;  Location: [facility] ORS;  Service: Podiatry;  Laterality: Right;     Social History Social History   Tobacco Use  . Smoking status: Former Smoker    Types: Pipe    Last attempt to quit: 05/23/1975    Years since quitting: 42.4  . Smokeless tobacco: Never Used  Substance Use Topics  . Alcohol use: No  . Drug use: No    Family History Family History  Problem Relation Age of Onset  . Diabetes Mother   . Hypertension Mother   . Diabetes Father   . Hypertension Father     Allergies  Allergen Reactions  . Sulfa Antibiotics Rash  . Zosyn [Piperacillin Sod-Tazobactam So] Rash     REVIEW OF SYSTEMS (Negative unless checked)  Constitutional: [] Weight loss  [] Fever  [] Chills Cardiac: [] Chest pain   [] Chest pressure   [] Palpitations   [] Shortness of breath when laying flat   [] Shortness of breath with exertion. Vascular:  [] Pain in legs with walking   [] Pain in legs at rest  [] History of DVT   [] Phlebitis   [] Swelling in legs   [] Varicose veins   [] Non-healing ulcers Pulmonary:   [] Uses home oxygen   [] Productive cough   [] Hemoptysis   [] Wheeze  [] COPD   [] Asthma Neurologic:  [] Dizziness   [] Seizures   [] History of stroke   [] History of TIA  [] Aphasia   [] Vissual changes   [] Weakness or numbness in arm   [] Weakness or numbness in leg Musculoskeletal:   [] Joint swelling   [] Joint pain   [] Low back pain Hematologic:  [] Easy bruising  [] Easy bleeding   [] Hypercoagulable state   [] Anemic Gastrointestinal:  [] Diarrhea   [] Vomiting  [] Gastroesophageal reflux/heartburn   [] Difficulty swallowing. Genitourinary:  [] Chronic kidney disease   [] Difficult urination  [] Frequent urination   [] Blood in urine Skin:  [] Rashes   [] Ulcers  Psychological:  [] History of anxiety   []  History of major depression.  Physical Examination  Vitals:   11/09/17 1537  BP: (!) 144/72  Pulse: 81  Resp: 18  Weight: 285 lb (129.3 kg)  Height: 5\' 10"  (1.778 m)   Body mass index is 40.89 kg/m. Gen: WD/WN, NAD Head: [place]/AT, No temporalis wasting.  Ear/Nose/Throat: Hearing  grossly intact, nares w/o erythema or drainage Eyes: PER, EOMI, sclera nonicteric.  Neck: Supple, no large masses.   Pulmonary:  Good air movement, no audible wheezing bilaterally, no use of accessory muscles.  Cardiac: RRR, no JVD Vascular: THE LATERAL FOOT WOUND IS CLEAN AND NOT INFECTED THE WOUND IS NOW ESSENTIALLY HEALED.  1+ EDEMA Vessel Right Left  Radial Palpable Palpable  Gastrointestinal: Non-distended. No guarding/no peritoneal signs.  Musculoskeletal: M/S 5/5 throughout.  No deformity or atrophy.  Neurologic: CN 2-12 intact. Symmetrical.  Speech is fluent. Motor exam as listed above. Psychiatric: Judgment intact, Mood & affect appropriate for pt's clinical situation. Dermatologic: No rashes or ulcers noted.  No changes consistent with cellulitis. Lymph : No lichenification or skin changes of chronic lymphedema.  CBC Lab Results  Component  Value Date   WBC 5.3 05/22/2017   HGB 13.4 05/22/2017   HCT 39.8 (L) 05/22/2017   MCV 83.3 05/22/2017   PLT 266 05/22/2017    BMET    Component Value Date/Time   NA 139 05/22/2017 0853   NA 140 04/04/2013 1238   K 4.4 05/22/2017 0853   K 4.1 04/04/2013 1238   CL 104 05/22/2017 0853   CL 108 (H) 04/04/2013 1238   CO2 28 05/22/2017 0853   CO2 30 04/04/2013 1238   GLUCOSE 90 05/22/2017 0853   GLUCOSE 98 04/04/2013 1238   BUN 13 05/22/2017 0853   BUN 19 (H) 04/04/2013 1238   CREATININE 0.91 05/22/2017 0853   CREATININE 0.87 04/04/2013 1238   CALCIUM 9.3 05/22/2017 0853   CALCIUM 8.8 04/04/2013 1238   GFRNONAA >60 05/22/2017 0853   GFRNONAA >60 04/04/2013 1238   GFRAA >60 05/22/2017 0853   GFRAA >60 04/04/2013 1238   CrCl cannot be calculated (Patient's most recent lab result is older than the maximum 21 days allowed.).  COAG Lab Results  Component Value Date   INR 2.28 05/22/2017   INR 1.00 11/30/2016    Radiology No results found.  Assessment/Plan 1. Atherosclerosis of native arteries of the extremities with  ulceration (HCC) No surgery or intervention at this point in time.    I have had a long discussion with the patient regarding venous insufficiency and why it  causes symptoms, specifically venous ulceration . I have discussed with the patient the chronic skin changes that accompany venous insufficiency and the long term sequela such as infection and recurring  ulceration.  Patient will be placed in [employer] which will be changed weekly drainage permitting.  In addition, behavioral modification including several periods of elevation of the lower extremities during the day will be continued. Achieving a position with the ankles at heart level was stressed to the patient  The patient is instructed to begin routine exercise, especially walking on a daily basis  Patient should undergo duplex ultrasound of the venous system to ensure that DVT or reflux is not present.  Following the review of the ultrasound the patient will follow up in one week to reassess the degree of swelling and the control that Unna therapy is offering.   The patient can be assessed for graduated compression stockings or wraps as well as a Lymph Pump once the ulcers are healed.  2. Venous ulcer (HCC) See #1  3. Mixed hyperlipidemia Continue statin as ordered and reviewed, no changes at this time   4. Type 2 diabetes mellitus with diabetic peripheral angiopathy and gangrene, with long-term current use of insulin (HCC) Continue hypoglycemic medications as already ordered, these medications have been reviewed and there are no changes at this time.  Hgb A1C to be monitored as already arranged by primary service     Levora DredgeGregory Braeden Kennan, MD  11/10/2017 9:51 PM

## 2017-11-16 ENCOUNTER — Encounter (INDEPENDENT_AMBULATORY_CARE_PROVIDER_SITE_OTHER): Payer: Medicare HMO

## 2018-01-08 ENCOUNTER — Telehealth (INDEPENDENT_AMBULATORY_CARE_PROVIDER_SITE_OTHER): Payer: Self-pay

## 2018-01-08 NOTE — Telephone Encounter (Signed)
Patient called and stated that he had received a call from our office. He was returning the call to let us know that he is going to keep his Monday appointment.  I tried calling the patient back to let him know that the call that he received was from the automated system just reminding him of his appointment on this coming Monday. I didn't get an answer, so there was no message left.

## 2018-01-11 ENCOUNTER — Encounter (INDEPENDENT_AMBULATORY_CARE_PROVIDER_SITE_OTHER): Payer: Self-pay | Admitting: Vascular Surgery

## 2018-01-11 ENCOUNTER — Ambulatory Visit (INDEPENDENT_AMBULATORY_CARE_PROVIDER_SITE_OTHER): Payer: Medicare HMO | Admitting: Vascular Surgery

## 2018-01-11 VITALS — BP 141/79 | HR 89 | Resp 18 | Ht 71.0 in | Wt 293.0 lb

## 2018-01-11 DIAGNOSIS — L97909 Non-pressure chronic ulcer of unspecified part of unspecified lower leg with unspecified severity: Secondary | ICD-10-CM

## 2018-01-11 DIAGNOSIS — E1152 Type 2 diabetes mellitus with diabetic peripheral angiopathy with gangrene: Secondary | ICD-10-CM

## 2018-01-11 DIAGNOSIS — I83009 Varicose veins of unspecified lower extremity with ulcer of unspecified site: Secondary | ICD-10-CM | POA: Diagnosis not present

## 2018-01-11 DIAGNOSIS — I872 Venous insufficiency (chronic) (peripheral): Secondary | ICD-10-CM

## 2018-01-11 DIAGNOSIS — I890 Lymphedema, not elsewhere classified: Secondary | ICD-10-CM

## 2018-01-11 DIAGNOSIS — Z794 Long term (current) use of insulin: Secondary | ICD-10-CM | POA: Diagnosis not present

## 2018-01-11 DIAGNOSIS — E782 Mixed hyperlipidemia: Secondary | ICD-10-CM | POA: Diagnosis not present

## 2018-01-11 DIAGNOSIS — I7025 Atherosclerosis of native arteries of other extremities with ulceration: Secondary | ICD-10-CM | POA: Diagnosis not present

## 2018-01-11 NOTE — Progress Notes (Signed)
MRN : [id number]  [name] is a 67 y.o. (16-May-1951) male who presents with chief complaint of  Chief Complaint  Patient presents with  . Follow-up    2 month no studies  .  History of Present Illness:   Patient presents for a wound check. The patient was last seen about4weeks ago for follow up regardinga skin graftdone on 05/29/2017.   He isdoing much better with respect to his edema.No furthercellulitis. He feels his foot is much better.He denies any drainage from either the skin graft donor site or graft site. The patient denies any fever, nausea vomiting.    No outpatient medications have been marked as taking for the 01/11/18 encounter (Office Visit) with [name], [name], MD.    Past Medical History:  Diagnosis Date  . Arthritis   . Bladder incontinence   . Chronic kidney disease    Stage 3 Kidney disease  . Diabetes mellitus without complication (HCC)   . Dyspnea    occassional  . Hyperlipidemia   . Hypertension   . Peripheral vascular disease (HCC)   . Sepsis [facility])     Past Surgical History:  Procedure Laterality Date  . ABDOMINAL AORTOGRAM W/LOWER EXTREMITY N/A 10/10/2016   Procedure: Abdominal Aortogram w/Lower Extremity;  Surgeon: Renford Dills, MD;  Location: [facility] INVASIVE CV LAB;  Service: Cardiovascular;  Laterality: N/A;  . CIRCUMCISION  1998  . EYE SURGERY Left    Retina Detachment  . GRAFT APPLICATION Right 05/29/2017   Procedure: GRAFT APPLICATION ( RIGHT FOOT );  Surgeon: Renford Dills, MD;  Location: [facility] ORS;  Service: Vascular;  Laterality: Right;  graft taken from patients right thigh  . IRRIGATION AND DEBRIDEMENT FOOT Right 10/03/2016   Procedure: IRRIGATION AND DEBRIDEMENT FOOT;  Surgeon: Gwyneth Revels, DPM;  Location: [facility] ORS;  Service: Podiatry;  Laterality: Right;  . LOWER EXTREMITY INTERVENTION  10/10/2016   Procedure: Lower Extremity Intervention;  Surgeon: Renford Dills, MD;  Location: [facility] INVASIVE CV  LAB;  Service: Cardiovascular;;  . PERIPHERAL VASCULAR BALLOON ANGIOPLASTY Left 10/10/2016   Procedure: Peripheral Vascular Balloon Angioplasty;  Surgeon: Renford Dills, MD;  Location: [facility] INVASIVE CV LAB;  Service: Cardiovascular;  Laterality: Left;  . TONSILLECTOMY    . WOUND DEBRIDEMENT Right 11/07/2016   Procedure: DEBRIDEMENT OF WOUND AND BONE RIGHT FOOT AND APPLY WOUND VAC;  Surgeon: Gwyneth Revels, DPM;  Location: [facility] ORS;  Service: Podiatry;  Laterality: Right;    Social History Social History   Tobacco Use  . Smoking status: Former Smoker    Types: Pipe    Last attempt to quit: 05/23/1975    Years since quitting: 42.6  . Smokeless tobacco: Never Used  Substance Use Topics  . Alcohol use: No  . Drug use: No    Family History Family History  Problem Relation Age of Onset  . Diabetes Mother   . Hypertension Mother   . Diabetes Father   . Hypertension Father     Allergies  Allergen Reactions  . Sulfa Antibiotics Rash  . Zosyn [Piperacillin Sod-Tazobactam So] Rash     REVIEW OF SYSTEMS (Negative unless checked)  Constitutional: Weight loss  Fever  Chills Cardiac: Chest pain   Chest pressure   Palpitations   Shortness of breath when laying flat   Shortness of breath with exertion. Vascular:  Pain in legs with walking   Pain in legs at rest  History of DVT   Phlebitis   Swelling in legs     Varicose veins   Non-healing ulcers Pulmonary:   Uses home oxygen   Productive cough   Hemoptysis   Wheeze  COPD   Asthma Neurologic:  Dizziness   Seizures   History of stroke   History of TIA  Aphasia   Vissual changes   Weakness or numbness in arm   Weakness or numbness in leg Musculoskeletal:   Joint swelling   Joint pain   Low back pain Hematologic:  Easy bruising  Easy bleeding   Hypercoagulable state   Anemic Gastrointestinal:  Diarrhea   Vomiting  Gastroesophageal reflux/heartburn   Difficulty  swallowing. Genitourinary:  Chronic kidney disease   Difficult urination  Frequent urination   Blood in urine Skin:  Rashes   Ulcers  Psychological:  History of anxiety    History of major depression.  Physical Examination  Vitals:   01/11/18 1455  BP: (!) 141/79  Pulse: 89  Resp: 18  Weight: 293 lb (132.9 kg)  Height:  (1.803 m)   Body mass index is 40.87 kg/m. Gen: WD/WN, NAD; walks with a walker Head: [place]/AT, No temporalis wasting.  Ear/Nose/Throat: Hearing grossly intact, nares w/o erythema or drainage Eyes: PER, EOMI, sclera nonicteric.  Neck: Supple, no large masses.   Pulmonary:  Good air movement, no audible wheezing bilaterally, no use of accessory muscles.  Cardiac: RRR, no JVD Vascular:  Right foot wound completely closed Vessel Right Left  Radial Palpable Palpable  PT Not Palpable Not Palpable  DP Not Palpable Not Palpable  Gastrointestinal: Non-distended. No guarding/no peritoneal signs.  Musculoskeletal: M/S 5/5 throughout.  No deformity or atrophy.  Neurologic: CN 2-12 intact. Symmetrical.  Speech is fluent. Motor exam as listed above. Psychiatric: Judgment intact, Mood & affect appropriate for pt's clinical situation. Dermatologic: venous rashes no ulcers noted.  No changes consistent with cellulitis. Lymph : No lichenification or skin changes of chronic lymphedema.  CBC Lab Results  Component Value Date   WBC 5.3 05/22/2017   HGB 13.4 05/22/2017   HCT 39.8 (L) 05/22/2017   MCV 83.3 05/22/2017   PLT 266 05/22/2017    BMET    Component Value Date/Time   NA 139 05/22/2017 0853   NA 140 04/04/2013 1238   K 4.4 05/22/2017 0853   K 4.1 04/04/2013 1238   CL 104 05/22/2017 0853   CL 108 (H) 04/04/2013 1238   CO2 28 05/22/2017 0853   CO2 30 04/04/2013 1238   GLUCOSE 90 05/22/2017 0853   GLUCOSE 98 04/04/2013 1238   BUN 13 05/22/2017 0853   BUN 19 (H) 04/04/2013 1238   CREATININE 0.91 05/22/2017 0853   CREATININE 0.87  04/04/2013 1238   CALCIUM 9.3 05/22/2017 0853   CALCIUM 8.8 04/04/2013 1238   GFRNONAA >60 05/22/2017 0853   GFRNONAA >60 04/04/2013 1238   GFRAA >60 05/22/2017 0853   GFRAA >60 04/04/2013 1238   CrCl cannot be calculated (Patient's most recent lab result is older than the maximum 21 days allowed.).  COAG Lab Results  Component Value Date   INR 2.28 05/22/2017   INR 1.00 11/30/2016    Radiology No results found.    Assessment/Plan 1. Venous ulcer (HCC) Now healed  2. Lymphedema I have had a long discussion with the patient regarding swelling and why it  causes symptoms.  Patient will begin wearing graduated compression stockings class 1 (20-30 mmHg) on a daily basis a prescription was given. The patient will  beginning wearing the stockings first thing in the morning and  removing them in the evening. The patient is instructed specifically not to sleep in the stockings.   In addition, behavioral modification will be initiated.  This will include frequent elevation, use of over the counter pain medications and exercise such as walking.  I have reviewed systemic causes for chronic edema such as liver, kidney and cardiac etiologies.  The patient denies problems with these organ systems.    Consideration for a lymph pump will also be made based upon the effectiveness of conservative therapy.  This would help to improve the edema control and prevent sequela such as ulcers and infections    3. Chronic venous insufficiency No surgery or intervention at this point in time.    I have had a long discussion with the patient regarding venous insufficiency and why it  causes symptoms. I have discussed with the patient the chronic skin changes that accompany venous insufficiency and the long term sequela such as infection and ulceration.  Patient will begin wearing graduated compression stockings class 1 (20-30 mmHg) or compression wraps on a daily basis a prescription was given. The patient  will put the stockings on first thing in the morning and removing them in the evening. The patient is instructed specifically not to sleep in the stockings.    In addition, behavioral modification including several periods of elevation of the lower extremities during the day will be continued. I have demonstrated that proper elevation is a position with the ankles at heart level.  The patient is instructed to begin routine exercise, especially walking on a daily basis  Following the review of the ultrasound the patient will follow up in 2-3 months to reassess the degree of swelling and the control that graduated compression stockings or compression wraps  is offering.   The patient can be assessed for a Lymph Pump at that time  4. Atherosclerosis of native arteries of the extremities with ulceration (HCC)  Recommend:  The patient has evidence of atherosclerosis of the lower extremities with claudication.  The patient does not voice lifestyle limiting changes at this point in time.  Noninvasive studies do not suggest clinically significant change.  No invasive studies, angiography or surgery at this time The patient should continue walking and begin a more formal exercise program.  The patient should continue antiplatelet therapy and aggressive treatment of the lipid abnormalities  No changes in the patient's medications at this time  The patient should continue wearing graduated compression socks 10-15 mmHg strength to control the mild edema.    5. Type 2 diabetes mellitus with diabetic peripheral angiopathy and gangrene, with long-term current use of insulin (HCC) Continue hypoglycemic medications as already ordered, these medications have been reviewed and there are no changes at this time.  Hgb A1C to be monitored as already arranged by primary service   6. Mixed hyperlipidemia Continue statin as ordered and reviewed, no changes at this time     Levora Dredge,  MD  01/11/2018 3:18 PM

## 2018-05-25 ENCOUNTER — Encounter: Payer: Self-pay | Admitting: *Deleted

## 2018-05-27 ENCOUNTER — Ambulatory Visit: Payer: Medicare HMO | Admitting: Anesthesiology

## 2018-05-27 ENCOUNTER — Other Ambulatory Visit: Payer: Self-pay

## 2018-05-27 ENCOUNTER — Encounter
Admission: RE | Disposition: A | Discharge status: Home / Self Care | Payer: Self-pay | Source: Ambulatory Visit | Attending: Ophthalmology

## 2018-05-27 ENCOUNTER — Ambulatory Visit
Admission: RE | Admit: 2018-05-27 | Discharge: 2018-05-27 | Disposition: A | Discharge status: Home / Self Care | Payer: Medicare HMO | Source: Ambulatory Visit | Attending: Ophthalmology | Admitting: Ophthalmology

## 2018-05-27 DIAGNOSIS — F0390 Unspecified dementia without behavioral disturbance: Secondary | ICD-10-CM | POA: Diagnosis not present

## 2018-05-27 DIAGNOSIS — Z89421 Acquired absence of other right toe(s): Secondary | ICD-10-CM | POA: Insufficient documentation

## 2018-05-27 DIAGNOSIS — H2512 Age-related nuclear cataract, left eye: Secondary | ICD-10-CM | POA: Diagnosis not present

## 2018-05-27 DIAGNOSIS — Z794 Long term (current) use of insulin: Secondary | ICD-10-CM | POA: Insufficient documentation

## 2018-05-27 DIAGNOSIS — Z79899 Other long term (current) drug therapy: Secondary | ICD-10-CM | POA: Diagnosis not present

## 2018-05-27 DIAGNOSIS — E1151 Type 2 diabetes mellitus with diabetic peripheral angiopathy without gangrene: Secondary | ICD-10-CM | POA: Diagnosis not present

## 2018-05-27 DIAGNOSIS — Z86718 Personal history of other venous thrombosis and embolism: Secondary | ICD-10-CM | POA: Insufficient documentation

## 2018-05-27 DIAGNOSIS — E1136 Type 2 diabetes mellitus with diabetic cataract: Secondary | ICD-10-CM | POA: Diagnosis not present

## 2018-05-27 DIAGNOSIS — I10 Essential (primary) hypertension: Secondary | ICD-10-CM | POA: Diagnosis not present

## 2018-05-27 DIAGNOSIS — Z87891 Personal history of nicotine dependence: Secondary | ICD-10-CM | POA: Diagnosis not present

## 2018-05-27 HISTORY — DX: Acute embolism and thrombosis of unspecified deep veins of unspecified lower extremity: I82.409

## 2018-05-27 HISTORY — PX: CATARACT EXTRACTION W/PHACO: SHX586

## 2018-05-27 LAB — GLUCOSE, CAPILLARY: GLUCOSE-CAPILLARY: 170 mg/dL — AB (ref 70–99)

## 2018-05-27 SURGERY — PHACOEMULSIFICATION, CATARACT, WITH IOL INSERTION
Anesthesia: Monitor Anesthesia Care | Site: Eye | Laterality: Left

## 2018-05-27 MED ORDER — TRYPAN BLUE 0.06 % OP SOLN
OPHTHALMIC | Status: DC | PRN
  Administered 2018-05-27: 0.5 mL via INTRAOCULAR

## 2018-05-27 MED ORDER — ARMC OPHTHALMIC DILATING DROPS
OPHTHALMIC | Status: AC
  Administered 2018-05-27: 1 via OPHTHALMIC
  Filled 2018-05-27: qty 0.5

## 2018-05-27 MED ORDER — POVIDONE-IODINE 5 % OP SOLN
OPHTHALMIC | Status: DC | PRN
  Administered 2018-05-27: 1 via OPHTHALMIC

## 2018-05-27 MED ORDER — SODIUM HYALURONATE 10 MG/ML IO SOLN
INTRAOCULAR | Status: DC | PRN
  Administered 2018-05-27: 0.55 mL via INTRAOCULAR

## 2018-05-27 MED ORDER — ONDANSETRON HCL 4 MG/2ML IJ SOLN: 4.0000 mg | Freq: Once | INTRAMUSCULAR | Status: DC | PRN

## 2018-05-27 MED ORDER — EPINEPHRINE PF 1 MG/ML IJ SOLN
INTRAMUSCULAR | Status: AC
  Filled 2018-05-27: qty 2

## 2018-05-27 MED ORDER — MOXIFLOXACIN HCL 0.5 % OP SOLN
OPHTHALMIC | Status: AC
  Filled 2018-05-27: qty 3

## 2018-05-27 MED ORDER — SODIUM HYALURONATE 23 MG/ML IO SOLN
INTRAOCULAR | Status: DC | PRN
  Administered 2018-05-27: 0.6 mL via INTRAOCULAR

## 2018-05-27 MED ORDER — MIDAZOLAM HCL 2 MG/2ML IJ SOLN
INTRAMUSCULAR | Status: DC | PRN
  Administered 2018-05-27 (×2): 1 mg via INTRAVENOUS

## 2018-05-27 MED ORDER — MIDAZOLAM HCL 2 MG/2ML IJ SOLN
INTRAMUSCULAR | Status: AC
  Filled 2018-05-27: qty 2

## 2018-05-27 MED ORDER — SODIUM HYALURONATE 23 MG/ML IO SOLN
INTRAOCULAR | Status: AC
  Filled 2018-05-27: qty 0.6

## 2018-05-27 MED ORDER — POVIDONE-IODINE 5 % OP SOLN
OPHTHALMIC | Status: AC
  Filled 2018-05-27: qty 30

## 2018-05-27 MED ORDER — LIDOCAINE HCL (PF) 4 % IJ SOLN
INTRAMUSCULAR | Status: AC
  Filled 2018-05-27: qty 5

## 2018-05-27 MED ORDER — FENTANYL CITRATE (PF) 100 MCG/2ML IJ SOLN
INTRAMUSCULAR | Status: AC
  Filled 2018-05-27: qty 2

## 2018-05-27 MED ORDER — NA CHONDROIT SULF-NA HYALURON 40-30 MG/ML IO SOLN
INTRAOCULAR | Status: DC | PRN
  Administered 2018-05-27: 0.5 mL via INTRAOCULAR

## 2018-05-27 MED ORDER — TRYPAN BLUE 0.06 % OP SOLN
OPHTHALMIC | Status: AC
  Filled 2018-05-27: qty 0.5

## 2018-05-27 MED ORDER — MOXIFLOXACIN HCL 0.5 % OP SOLN: 1.0000 [drp] | OPHTHALMIC | Status: DC | PRN

## 2018-05-27 MED ORDER — FENTANYL CITRATE (PF) 100 MCG/2ML IJ SOLN
INTRAMUSCULAR | Status: DC | PRN
  Administered 2018-05-27 (×2): 50 ug via INTRAVENOUS

## 2018-05-27 MED ORDER — NA CHONDROIT SULF-NA HYALURON 40-30 MG/ML IO SOLN
INTRAOCULAR | Status: DC | PRN
Start: 2018-05-27 — End: 2018-05-27
  Administered 2018-05-27: 0.5 mL via INTRAOCULAR

## 2018-05-27 MED ORDER — TETRACAINE HCL 0.5 % OP SOLN
1.0000 [drp] | OPHTHALMIC | Status: DC | PRN
  Administered 2018-05-27 (×3): 1 [drp] via OPHTHALMIC

## 2018-05-27 MED ORDER — SODIUM CHLORIDE 0.9 % IV SOLN
INTRAVENOUS | Status: DC
  Administered 2018-05-27 (×2): via INTRAVENOUS

## 2018-05-27 MED ORDER — TETRACAINE HCL 0.5 % OP SOLN
OPHTHALMIC | Status: AC
  Administered 2018-05-27: 1 [drp] via OPHTHALMIC
  Filled 2018-05-27: qty 4

## 2018-05-27 MED ORDER — MOXIFLOXACIN HCL 0.5 % OP SOLN
OPHTHALMIC | Status: DC | PRN
  Administered 2018-05-27: 0.2 mL

## 2018-05-27 MED ORDER — ARMC OPHTHALMIC DILATING DROPS
1.0000 "application " | OPHTHALMIC | Status: AC
  Administered 2018-05-27 (×3): 1 via OPHTHALMIC

## 2018-05-27 MED ORDER — EPINEPHRINE PF 1 MG/ML IJ SOLN
INTRAOCULAR | Status: DC | PRN
  Administered 2018-05-27: 08:00:00 via OPHTHALMIC

## 2018-05-27 SURGICAL SUPPLY — 21 items
CANNULA ANT/CHMB 27GA (MISCELLANEOUS) ×3 IMPLANT
DISSECTOR HYDRO NUCLEUS 50X22 (MISCELLANEOUS) ×3 IMPLANT
GLOVE BIO SURGEON STRL SZ8 (GLOVE) ×3 IMPLANT
GLOVE BIOGEL M 6.5 STRL (GLOVE) ×3 IMPLANT
GLOVE SURG LX 7.5 STRW (GLOVE) ×2
GLOVE SURG LX STRL 7.5 STRW (GLOVE) ×1 IMPLANT
GOWN STRL REUS W/ TWL LRG LVL3 (GOWN DISPOSABLE) ×2 IMPLANT
GOWN STRL REUS W/TWL LRG LVL3 (GOWN DISPOSABLE) ×4
LABEL CATARACT MEDS ST (LABEL) ×3 IMPLANT
LENS IOL TECNIS ITEC 19.5 (Intraocular Lens) ×3 IMPLANT
PACK CATARACT (MISCELLANEOUS) ×3 IMPLANT
PACK CATARACT KING (MISCELLANEOUS) ×3 IMPLANT
PACK EYE AFTER SURG (MISCELLANEOUS) ×3 IMPLANT
RING MALYGIN 7.0 (MISCELLANEOUS) ×3 IMPLANT
SOL BAL SALT 15ML (MISCELLANEOUS) ×3
SOL BSS BAG (MISCELLANEOUS) ×3
SOLUTION BAL SALT 15ML (MISCELLANEOUS) ×1 IMPLANT
SOLUTION BSS BAG (MISCELLANEOUS) ×1 IMPLANT
SYR 3ML LL SCALE MARK (SYRINGE) ×3 IMPLANT
WATER STERILE IRR 250ML POUR (IV SOLUTION) ×3 IMPLANT
WIPE NON LINTING 3.25X3.25 (MISCELLANEOUS) ×3 IMPLANT

## 2018-05-27 NOTE — OR Nursing (Signed)
DISCHARGE PENDING RIDE HOME.

## 2018-05-27 NOTE — H&P (Signed)
The History and Physical notes are on paper, have been signed, and are to be scanned.   I have examined the patient and there are no changes to the H&P.   Willey Blade 05/27/2018 7:27 AM

## 2018-05-27 NOTE — Anesthesia Procedure Notes (Signed)
Procedure Name: MAC Date/Time: 05/27/2018 7:38 AM Performed by: Allean Found, CRNA Pre-anesthesia Checklist: Patient identified, Emergency Drugs available, Suction available, Patient being monitored and Timeout performed Patient Re-evaluated:Patient Re-evaluated prior to induction Oxygen Delivery Method: Nasal cannula Placement Confirmation: positive ETCO2

## 2018-05-27 NOTE — Anesthesia Preprocedure Evaluation (Signed)
Anesthesia Evaluation  Patient identified by MRN, date of birth, ID band Patient awake    Reviewed: Allergy & Precautions, NPO status , Patient's Chart, lab work & pertinent test results  History of Anesthesia Complications Negative for: history of anesthetic complications  Airway Mallampati: II       Dental  (+) Missing, Chipped, Poor Dentition   Pulmonary neg sleep apnea, neg COPD, former smoker,           Cardiovascular hypertension, Pt. on medications (-) Past MI and (-) CHF (-) dysrhythmias (-) Valvular Problems/Murmurs     Neuro/Psych neg Seizures Dementia    GI/Hepatic neg GERD  ,  Endo/Other  diabetes, Type 2, Oral Hypoglycemic Agents, Insulin Dependent  Renal/GU Renal InsufficiencyRenal disease     Musculoskeletal   Abdominal   Peds  Hematology   Anesthesia Other Findings   Reproductive/Obstetrics                             Anesthesia Physical Anesthesia Plan  ASA: III  Anesthesia Plan: MAC   Post-op Pain Management:    Induction: Intravenous  PONV Risk Score and Plan:   Airway Management Planned: Nasal Cannula  Additional Equipment:   Intra-op Plan:   Post-operative Plan:   Informed Consent: I have reviewed the patients History and Physical, chart, labs and discussed the procedure including the risks, benefits and alternatives for the proposed anesthesia with the patient or authorized representative who has indicated his/her understanding and acceptance.     Plan Discussed with:   Anesthesia Plan Comments:         Anesthesia Quick Evaluation

## 2018-05-27 NOTE — Anesthesia Post-op Follow-up Note (Signed)
Anesthesia QCDR form completed.        

## 2018-05-27 NOTE — Anesthesia Postprocedure Evaluation (Signed)
Anesthesia Post Note  Patient: [name]  Procedure(s) Performed: CATARACT EXTRACTION PHACO AND INTRAOCULAR LENS PLACEMENT ([place]) (Left Eye)  Patient location during evaluation: Short Stay Anesthesia Type: MAC Level of consciousness: awake, awake and alert and oriented Pain management: pain level controlled Vital Signs Assessment: post-procedure vital signs reviewed and stable Respiratory status: spontaneous breathing Cardiovascular status: blood pressure returned to baseline Postop Assessment: no headache Anesthetic complications: no     Last Vitals:  Vitals:   05/27/18 0621 05/27/18 0839  BP: (!) 127/59 121/64  Pulse: 75 65  Resp: 18 18  Temp: 37 C (!) 36.4 C  SpO2: 99% 100%    Last Pain:  Vitals:   05/27/18 0839  TempSrc: Oral  PainSc: 0-No pain                 Buckner [place]

## 2018-05-27 NOTE — Transfer of Care (Addendum)
Immediate Anesthesia Transfer of Care Note  Patient: [name]  Procedure(s) Performed: CATARACT EXTRACTION PHACO AND INTRAOCULAR LENS PLACEMENT ([facility]) (Left Eye)  Patient Location: PACU  Anesthesia Type:MAC  Level of Consciousness: awake, alert  and oriented  Airway & Oxygen Therapy: Patient Spontanous Breathing and Patient connected to nasal cannula oxygen  Post-op Assessment: Report given to RN and Post -op Vital signs reviewed and stable  Post vital signs: Reviewed and stable  Last Vitals:  Vitals Value Taken Time  BP    Temp    Pulse    Resp    SpO2      Last Pain:  Vitals:   05/27/18 0621  TempSrc: Oral  PainSc: 0-No pain         Complications: No apparent anesthesia complications

## 2018-05-27 NOTE — Discharge Instructions (Addendum)
Eye Surgery Discharge Instructions  Expect mild scratchy sensation or mild soreness. DO NOT RUB YOUR EYE!  The day of surgery:  Minimal physical activity, but bed rest is not required  No reading, computer work, or close hand work  No bending, lifting, or straining.  May watch TV  For 24 hours:  No driving, legal decisions, or alcoholic beverages  Safety precautions  Eat anything you prefer: It is better to start with liquids, then soup then solid foods.  Solar shield eyeglasses should be worn for comfort in the sunlight/patch while sleeping  Resume all regular medications including aspirin or Coumadin if these were discontinued prior to surgery. You may shower, bathe, shave, or wash your hair. Tylenol may be taken for mild discomfort. FOLLOW DR. Elmer Bales EYE DROP INSTRUCTION SHEET AS REVIEWED.  Call your doctor if you experience significant pain, nausea, or vomiting, fever > 101 or other signs of infection. [phone] or [phone] Specific instructions:  Follow-up Information    [place] Crane, MD Follow up.   Specialty:  Ophthalmology Why:  05/28/18 @ 9:40 am Contact information: [address] [place] [address] [phone]

## 2018-05-27 NOTE — Op Note (Signed)
OPERATIVE NOTE  JOHARI PINNEY [id number] 05/27/2018   PREOPERATIVE DIAGNOSIS:  Nuclear sclerotic cataract left eye.  H25.12   POSTOPERATIVE DIAGNOSIS:    Nuclear sclerotic cataract left eye.     PROCEDURE:  Phacoemusification with posterior chamber intraocular lens placement of the left eye   LENS:   Implant Name Type Inv. Item Serial No. Manufacturer Lot No. LRB No. Used  LENS IOL DIOP 19.5 - [id number]  1906 Intraocular Lens LENS IOL DIOP 19.5 [id number]  1906 AMO  Left 1       PCB00 +19.5   ULTRASOUND TIME: 3 minutes 06 seconds.  CDE 30.16   SURGEON:  Willey Blade, MD, MPH   ANESTHESIA:  Topical with tetracaine drops augmented with 1% preservative-free intracameral lidocaine.  ESTIMATED BLOOD LOSS: <1 mL   COMPLICATIONS:  None.   DESCRIPTION OF PROCEDURE:  The patient was identified in the holding room and transported to the operating room and placed in the supine position under the operating microscope.  The left eye was identified as the operative eye and it was prepped and draped in the usual sterile ophthalmic fashion.   A 1.0 millimeter clear-corneal paracentesis was made at the 5:00 position. 0.5 ml of preservative-free 1% lidocaine with epinephrine was injected into the anterior chamber.  There was a poor red reflex, so Trypan blue was instilled under an air bubble and rinsed out with BSS to stain the capsule for improved visualization.   The anterior chamber was filled with Healon 5 viscoelastic.  A 2.4 millimeter keratome was used to make a near-clear corneal incision at the 2:00 position.    The pupil was small, so a 7.0 mm Malyugin ring was placed without difficulty.  A curvilinear capsulorrhexis was made with a cystotome and capsulorrhexis forceps.  Balanced salt solution was used to hydrodissect and hydrodelineate the nucleus.   Phacoemulsification was then used in stop and chop fashion to remove the lens nucleus and epinucleus.    The lens was extremely dense  and heavy phaco was needed despite multiple chop maneuvers.  2 vials of viscoat were used throughout the case to help protect the corneal endothelium and posterior capsule.  The remaining cortex was then removed using the irrigation and aspiration handpiece. Healon was then placed into the capsular bag to distend it for lens placement.  A lens was then injected into the capsular bag.    The malyugin ring was removed. The remaining viscoelastic was aspirated.   Wounds were hydrated with balanced salt solution.  The anterior chamber was inflated to a physiologic pressure with balanced salt solution.  Intracameral vigamox 0.1 mL undiltued was injected into the eye and a drop placed onto the ocular surface.  No wound leaks were noted.  The patient was taken to the recovery room in stable condition without complications of anesthesia or surgery  Willey Blade 05/27/2018, 8:35 AM

## 2018-07-19 ENCOUNTER — Encounter (INDEPENDENT_AMBULATORY_CARE_PROVIDER_SITE_OTHER): Payer: Self-pay | Admitting: Vascular Surgery

## 2018-07-19 ENCOUNTER — Ambulatory Visit (INDEPENDENT_AMBULATORY_CARE_PROVIDER_SITE_OTHER): Payer: Medicare HMO | Admitting: Vascular Surgery

## 2018-07-19 VITALS — BP 144/75 | HR 99 | Resp 17 | Ht 71.0 in | Wt 281.0 lb

## 2018-07-19 DIAGNOSIS — Z87891 Personal history of nicotine dependence: Secondary | ICD-10-CM

## 2018-07-19 DIAGNOSIS — I70213 Atherosclerosis of native arteries of extremities with intermittent claudication, bilateral legs: Secondary | ICD-10-CM | POA: Diagnosis not present

## 2018-07-19 DIAGNOSIS — I872 Venous insufficiency (chronic) (peripheral): Secondary | ICD-10-CM

## 2018-07-19 DIAGNOSIS — Z794 Long term (current) use of insulin: Secondary | ICD-10-CM

## 2018-07-19 DIAGNOSIS — I890 Lymphedema, not elsewhere classified: Secondary | ICD-10-CM | POA: Diagnosis not present

## 2018-07-19 DIAGNOSIS — E782 Mixed hyperlipidemia: Secondary | ICD-10-CM

## 2018-07-19 DIAGNOSIS — E1152 Type 2 diabetes mellitus with diabetic peripheral angiopathy with gangrene: Secondary | ICD-10-CM | POA: Diagnosis not present

## 2018-07-19 NOTE — Progress Notes (Signed)
MRN : [id number]  [name] is a 67 y.o. (01-21-1951) male who presents with chief complaint of  Chief Complaint  Patient presents with  . Follow-up    51month follow up  .  History of Present Illness:   Patient presents for follow up of his lower extremity venous disese. The patient was last seen about 6 months ago for follow up regardinga skin graftdone on 05/29/2017.   He isdoing much better with respect to his edema.No furthercellulitis. He feels his foot is much better.He denies any drainage from either the skin graft donor site or graft site. The patient denies any fever, nausea vomiting.  Current Meds  Medication Sig  . acetaminophen (TYLENOL) 325 MG tablet Take 650 mg by mouth every 6 (six) hours as needed.  Marland Kitchen atorvastatin (LIPITOR) 20 MG tablet Take 20 mg by mouth daily.  . insulin glargine (LANTUS) 100 UNIT/ML injection Inject 0.15 mLs (15 Units total) into the skin at bedtime.  . insulin lispro (HUMALOG) 100 UNIT/ML injection Inject 4 Units into the skin See admin instructions. Inject 4 units SQ with breakfast, inject 4 units SQ with lunch and inject 4 units SQ with dinner  . lisinopril-hydrochlorothiazide (PRINZIDE,ZESTORETIC) 20-25 MG tablet Take by mouth.  . metFORMIN (GLUCOPHAGE) 500 MG tablet Take 500 mg by mouth 2 (two) times daily with a meal.   . Multiple Vitamins-Minerals (CENTROVITE) TABS Take 1 tablet by mouth daily.  . Simethicone (GAS-X ULTRA STRENGTH PO) Take 1 capsule by mouth 4 (four) times daily as needed (for flatulence).    Past Medical History:  Diagnosis Date  . Arthritis   . Bladder incontinence   . Chronic kidney disease    Stage 3 Kidney disease  . Dementia (HCC)   . Diabetes mellitus without complication (HCC)   . DVT of leg (deep venous thrombosis) (HCC)    H/O RIGHT LEG 2018  . Dyspnea    occassional  . Hyperlipidemia   . Hypertension   . Peripheral vascular disease (HCC)   . Sepsis [facility])     Past Surgical  History:  Procedure Laterality Date  . ABDOMINAL AORTOGRAM W/LOWER EXTREMITY N/A 10/10/2016   Procedure: Abdominal Aortogram w/Lower Extremity;  Surgeon: Renford Dills, MD;  Location: [facility] INVASIVE CV LAB;  Service: Cardiovascular;  Laterality: N/A;  . CATARACT EXTRACTION W/PHACO Left 05/27/2018   Procedure: CATARACT EXTRACTION PHACO AND INTRAOCULAR LENS PLACEMENT (IOC);  Surgeon: [place] Crane, MD;  Location: [facility] ORS;  Service: Ophthalmology;  Laterality: Left;  [place] 03:06.2 AP% 16.1 CDE 30.16 FLUID PACK LOT @ [id number]  . CIRCUMCISION  1998  . EYE SURGERY Left    Retina Detachment  . GRAFT APPLICATION Right 05/29/2017   Procedure: GRAFT APPLICATION ( RIGHT FOOT );  Surgeon: Renford Dills, MD;  Location: [facility] ORS;  Service: Vascular;  Laterality: Right;  graft taken from patients right thigh  . IRRIGATION AND DEBRIDEMENT FOOT Right 10/03/2016   Procedure: IRRIGATION AND DEBRIDEMENT FOOT;  Surgeon: Gwyneth Revels, DPM;  Location: [facility] ORS;  Service: Podiatry;  Laterality: Right;  . LOWER EXTREMITY INTERVENTION  10/10/2016   Procedure: Lower Extremity Intervention;  Surgeon: Renford Dills, MD;  Location: [facility] INVASIVE CV LAB;  Service: Cardiovascular;;  . PERIPHERAL VASCULAR BALLOON ANGIOPLASTY Left 10/10/2016   Procedure: Peripheral Vascular Balloon Angioplasty;  Surgeon: Renford Dills, MD;  Location: [facility] INVASIVE CV LAB;  Service: Cardiovascular;  Laterality: Left;  . TONSILLECTOMY    . WOUND DEBRIDEMENT Right 11/07/2016  Procedure: DEBRIDEMENT OF WOUND AND BONE RIGHT FOOT AND APPLY WOUND VAC;  Surgeon: Gwyneth RevelsJustin Fowler, DPM;  Location: [facility] ORS;  Service: Podiatry;  Laterality: Right;    Social History Social History   Tobacco Use  . Smoking status: Former Smoker    Types: Pipe    Last attempt to quit: 05/23/1975    Years since quitting: 43.1  . Smokeless tobacco: Never Used  Substance Use Topics  . Alcohol use: No  . Drug use: No    Family History Family History    Problem Relation Age of Onset  . Diabetes Mother   . Hypertension Mother   . Diabetes Father   . Hypertension Father     Allergies  Allergen Reactions  . Sulfa Antibiotics Rash  . Zosyn [Piperacillin Sod-Tazobactam So] Rash     REVIEW OF SYSTEMS (Negative unless checked)  Constitutional: [] Weight loss  [] Fever  [] Chills Cardiac: [] Chest pain   [] Chest pressure   [] Palpitations   [] Shortness of breath when laying flat   [] Shortness of breath with exertion. Vascular:  [x] Pain in legs with walking   [] Pain in legs at rest  [] History of DVT   [] Phlebitis   [x] Swelling in legs   [] Varicose veins   [] Non-healing ulcers Pulmonary:   [] Uses home oxygen   [] Productive cough   [] Hemoptysis   [] Wheeze  [] COPD   [] Asthma Neurologic:  [] Dizziness   [] Seizures   [] History of stroke   [] History of TIA  [] Aphasia   [] Vissual changes   [] Weakness or numbness in arm   [] Weakness or numbness in leg Musculoskeletal:   [] Joint swelling   [] Joint pain   [] Low back pain Hematologic:  [] Easy bruising  [] Easy bleeding   [] Hypercoagulable state   [] Anemic Gastrointestinal:  [] Diarrhea   [] Vomiting  [] Gastroesophageal reflux/heartburn   [] Difficulty swallowing. Genitourinary:  [] Chronic kidney disease   [] Difficult urination  [] Frequent urination   [] Blood in urine Skin:  [x] Rashes   [] Ulcers  Psychological:  [] History of anxiety   []  History of major depression.  Physical Examination  Vitals:   07/19/18 1335  BP: (!) 144/75  Pulse: 99  Resp: 17  Weight: 281 lb (127.5 kg)  Height: 5\' 11"  (1.803 m)   Body mass index is 39.19 kg/m. Gen: WD/WN, NAD Head: [place]/AT, No temporalis wasting.  Ear/Nose/Throat: Hearing grossly intact, nares w/o erythema or drainage Eyes: PER, EOMI, sclera nonicteric.  Neck: Supple, no large masses.   Pulmonary:  Good air movement, no audible wheezing bilaterally, no use of accessory muscles.  Cardiac: RRR, no JVD Vascular: scattered varicosities present bilaterally.  Mild  venous stasis changes to the legs bilaterally.  2+ soft pitting edema.  Right foot skin graft remains well healed. Vessel Right Left  Radial Palpable Palpable  PT Not Palpable Not Palpable  DP Not Palpable Not Palpable  Gastrointestinal: Non-distended. No guarding/no peritoneal signs.  Musculoskeletal: M/S 5/5 throughout.  No deformity or atrophy.  Neurologic: CN 2-12 intact. Symmetrical.  Speech is fluent. Motor exam as listed above. Psychiatric: Judgment intact, Mood & affect appropriate for pt's clinical situation. Dermatologic: venous rashes no ulcers noted.  No changes consistent with cellulitis. Lymph : No lichenification or skin changes of chronic lymphedema.  CBC Lab Results  Component Value Date   WBC 5.3 05/22/2017   HGB 13.4 05/22/2017   HCT 39.8 (L) 05/22/2017   MCV 83.3 05/22/2017   PLT 266 05/22/2017    BMET    Component Value Date/Time   NA 139 05/22/2017 0853  NA 140 04/04/2013 1238   K 4.4 05/22/2017 0853   K 4.1 04/04/2013 1238   CL 104 05/22/2017 0853   CL 108 (H) 04/04/2013 1238   CO2 28 05/22/2017 0853   CO2 30 04/04/2013 1238   GLUCOSE 90 05/22/2017 0853   GLUCOSE 98 04/04/2013 1238   BUN 13 05/22/2017 0853   BUN 19 (H) 04/04/2013 1238   CREATININE 0.91 05/22/2017 0853   CREATININE 0.87 04/04/2013 1238   CALCIUM 9.3 05/22/2017 0853   CALCIUM 8.8 04/04/2013 1238   GFRNONAA >60 05/22/2017 0853   GFRNONAA >60 04/04/2013 1238   GFRAA >60 05/22/2017 0853   GFRAA >60 04/04/2013 1238   CrCl cannot be calculated (Patient's most recent lab result is older than the maximum 21 days allowed.).  COAG Lab Results  Component Value Date   INR 2.28 05/22/2017   INR 1.00 11/30/2016    Radiology No results found.   Assessment/Plan 1. Atherosclerosis of native artery of both lower extremities with intermittent claudication (HCC) Recommend:  The patient has evidence of atherosclerosis of the lower extremities with claudication.  The patient does not  voice lifestyle limiting changes at this point in time.  Noninvasive studies do not suggest clinically significant change.  No invasive studies, angiography or surgery at this time The patient should continue walking and begin a more formal exercise program.  The patient should continue antiplatelet therapy and aggressive treatment of the lipid abnormalities  No changes in the patient's medications at this time  The patient should continue wearing graduated compression socks 10-15 mmHg strength to control the mild edema.   - VAS [place] ABI WITH/WO TBI; Future  2. Chronic venous insufficiency No surgery or intervention at this point in time.    I have had a long discussion with the patient regarding venous insufficiency and why it  causes symptoms. I have discussed with the patient the chronic skin changes that accompany venous insufficiency and the long term sequela such as infection and ulceration.  Patient will begin wearing graduated compression stockings class 1 (20-30 mmHg) or compression wraps on a daily basis a prescription was given. The patient will put the stockings on first thing in the morning and removing them in the evening. The patient is instructed specifically not to sleep in the stockings.    In addition, behavioral modification including several periods of elevation of the lower extremities during the day will be continued. I have demonstrated that proper elevation is a position with the ankles at heart level.  The patient is instructed to begin routine exercise, especially walking on a daily basis   3. Lymphedema No surgery or intervention at this point in time.    I have had a long discussion with the patient regarding venous insufficiency and why it  causes symptoms. I have discussed with the patient the chronic skin changes that accompany venous insufficiency and the long term sequela such as infection and ulceration.  Patient will begin wearing graduated compression  stockings class 1 (20-30 mmHg) or compression wraps on a daily basis a prescription was given. The patient will put the stockings on first thing in the morning and removing them in the evening. The patient is instructed specifically not to sleep in the stockings.    In addition, behavioral modification including several periods of elevation of the lower extremities during the day will be continued. I have demonstrated that proper elevation is a position with the ankles at heart level.  The patient is instructed  to begin routine exercise, especially walking on a daily basis   4. Type 2 diabetes mellitus with diabetic peripheral angiopathy and gangrene, with long-term current use of insulin (HCC) Continue hypoglycemic medications as already ordered, these medications have been reviewed and there are no changes at this time.  Hgb A1C to be monitored as already arranged by primary service   5. Mixed hyperlipidemia  Continue statin as ordered and reviewed, no changes at this time   Levora Dredge, MD  07/19/2018 1:38 PM

## 2019-07-21 ENCOUNTER — Ambulatory Visit (INDEPENDENT_AMBULATORY_CARE_PROVIDER_SITE_OTHER): Payer: Medicare Other | Admitting: Vascular Surgery

## 2019-07-21 ENCOUNTER — Ambulatory Visit (INDEPENDENT_AMBULATORY_CARE_PROVIDER_SITE_OTHER): Payer: Medicare Other

## 2019-07-21 ENCOUNTER — Other Ambulatory Visit: Payer: Self-pay

## 2019-07-21 DIAGNOSIS — I70213 Atherosclerosis of native arteries of extremities with intermittent claudication, bilateral legs: Secondary | ICD-10-CM

## 2019-07-26 ENCOUNTER — Encounter (INDEPENDENT_AMBULATORY_CARE_PROVIDER_SITE_OTHER): Payer: Self-pay | Admitting: Vascular Surgery

## 2020-07-19 ENCOUNTER — Encounter (INDEPENDENT_AMBULATORY_CARE_PROVIDER_SITE_OTHER): Payer: Medicare Other

## 2020-07-19 ENCOUNTER — Ambulatory Visit (INDEPENDENT_AMBULATORY_CARE_PROVIDER_SITE_OTHER): Payer: Medicare Other | Admitting: Vascular Surgery

## 2020-08-23 ENCOUNTER — Other Ambulatory Visit (INDEPENDENT_AMBULATORY_CARE_PROVIDER_SITE_OTHER): Payer: Self-pay | Admitting: Vascular Surgery

## 2020-08-23 DIAGNOSIS — I70219 Atherosclerosis of native arteries of extremities with intermittent claudication, unspecified extremity: Secondary | ICD-10-CM

## 2020-09-03 ENCOUNTER — Encounter (INDEPENDENT_AMBULATORY_CARE_PROVIDER_SITE_OTHER): Payer: Medicare HMO

## 2020-09-03 ENCOUNTER — Ambulatory Visit (INDEPENDENT_AMBULATORY_CARE_PROVIDER_SITE_OTHER): Payer: Self-pay | Admitting: Vascular Surgery

## 2020-09-24 ENCOUNTER — Ambulatory Visit (INDEPENDENT_AMBULATORY_CARE_PROVIDER_SITE_OTHER): Payer: Medicare HMO | Admitting: Vascular Surgery

## 2020-09-24 ENCOUNTER — Encounter (INDEPENDENT_AMBULATORY_CARE_PROVIDER_SITE_OTHER): Payer: Self-pay | Admitting: Vascular Surgery

## 2020-09-24 ENCOUNTER — Ambulatory Visit (INDEPENDENT_AMBULATORY_CARE_PROVIDER_SITE_OTHER): Payer: Medicare HMO

## 2020-09-24 ENCOUNTER — Other Ambulatory Visit: Payer: Self-pay

## 2020-09-24 VITALS — BP 132/73 | HR 92 | Ht 67.0 in | Wt 281.0 lb

## 2020-09-24 DIAGNOSIS — I70219 Atherosclerosis of native arteries of extremities with intermittent claudication, unspecified extremity: Secondary | ICD-10-CM

## 2020-09-24 DIAGNOSIS — L97514 Non-pressure chronic ulcer of other part of right foot with necrosis of bone: Secondary | ICD-10-CM | POA: Diagnosis not present

## 2020-09-24 DIAGNOSIS — I872 Venous insufficiency (chronic) (peripheral): Secondary | ICD-10-CM | POA: Diagnosis not present

## 2020-09-24 DIAGNOSIS — I70213 Atherosclerosis of native arteries of extremities with intermittent claudication, bilateral legs: Secondary | ICD-10-CM

## 2020-09-24 DIAGNOSIS — E782 Mixed hyperlipidemia: Secondary | ICD-10-CM

## 2020-09-24 DIAGNOSIS — E139 Other specified diabetes mellitus without complications: Secondary | ICD-10-CM | POA: Diagnosis not present

## 2020-09-24 MED ORDER — SILVER SULFADIAZINE 1 % EX CREA: 1.0000 "application " | TOPICAL_CREAM | Freq: Every day | CUTANEOUS | 1 refills | Status: DC

## 2020-09-24 NOTE — Progress Notes (Signed)
MRN : [id number]  [name] is a 70 y.o. ([birth year]) male who presents with chief complaint of No chief complaint on file. Marland Kitchen  History of Present Illness:   Patient presents for follow up of his lower extremity venous disese. The patient was last seen about two years ago for follow up regardingan ulcer of the right lateral foot which has been hard to heal.  There is a history of skin graftdone on 05/29/2017.   He isdoing much better with respect to his edema.No furthercellulitis. He feels his foot is doing OK.He denies any large amount of drainage from the ulcer. The patient denies any fever, nausea vomiting.  ABI's Rt=[place] (triphasic signals) and Lt [place] (triphasic signals.  TBI's are normal bilaterally   No outpatient medications have been marked as taking for the 09/24/20 encounter (Appointment) with [name], [name], MD.    Past Medical History:  Diagnosis Date  . Arthritis   . Bladder incontinence   . Chronic kidney disease    Stage 3 Kidney disease  . Dementia (HCC)   . Diabetes mellitus without complication (HCC)   . DVT of leg (deep venous thrombosis) (HCC)    H/O RIGHT LEG 2018  . Dyspnea    occassional  . Hyperlipidemia   . Hypertension   . Peripheral vascular disease (HCC)   . Sepsis [facility])     Past Surgical History:  Procedure Laterality Date  . ABDOMINAL AORTOGRAM W/LOWER EXTREMITY N/A 10/10/2016   Procedure: Abdominal Aortogram w/Lower Extremity;  Surgeon: Renford Dills, MD;  Location: [facility] INVASIVE [facility];  Service: Cardiovascular;  Laterality: N/A;  . CATARACT EXTRACTION W/PHACO Left 05/27/2018   Procedure: CATARACT EXTRACTION PHACO AND INTRAOCULAR LENS PLACEMENT (IOC);  Surgeon: [place] Crane, MD;  Location: [facility] ORS;  Service: Ophthalmology;  Laterality: Left;  [place] 03:06.2 AP% 16.1 CDE 30.16 FLUID PACK LOT @ [id number] H  . CIRCUMCISION  1998  . EYE SURGERY Left    Retina Detachment  . GRAFT APPLICATION Right 05/29/2017    Procedure: GRAFT APPLICATION ( RIGHT FOOT );  Surgeon: Renford Dills, MD;  Location: [facility] OR[facility];  Service: Vascular;  Laterality: Right;  graft taken from patients right thigh  . IRRIGATION AND DEBRIDEMENT FOOT Right 10/03/2016   Procedure: IRRIGATION AND DEBRIDEMENT FOOT;  Surgeon: Gwyneth Revels, DPM;  Location: [facility] OR[facility];  Service: Podiatry;  Laterality: Right;  . LOWER EXTREMITY INTERVENTION  10/10/2016   Procedure: Lower Extremity Intervention;  Surgeon: Renford Dills, MD;  Location: [facility];  Service: Cardiovascular;;  . PERIPHERAL VASCULAR BALLOON ANGIOPLASTY Left 10/10/2016   Procedure: Peripheral Vascular Balloon Angioplasty;  Surgeon: Renford Dills, MD;  Location: [facility];  Service: Cardiovascular;  Laterality: Left;  . TONSILLECTOMY    . WOUND DEBRIDEMENT Right 11/07/2016   Procedure: DEBRIDEMENT OF WOUND AND BONE RIGHT FOOT AND APPLY WOUND VAC;  Surgeon: Gwyneth Revels, DPM;  Location: [facility] ORS;  Service: Podiatry;  Laterality: Right;    Social History Social History   Tobacco Use  . Smoking status: Former Smoker    Types: Pipe    Quit date: 05/23/1975    Years since quitting: 45.3  . Smokeless tobacco: Never Used  Vaping Use  . Vaping Use: Never used  Substance Use Topics  . Alcohol use: No  . Drug use: No    Family History Family History  Problem Relation Age of Onset  . Diabetes Mother   . Hypertension Mother   . Diabetes  Father   . Hypertension Father     Allergies  Allergen Reactions  . Sulfa Antibiotics Rash  . Zosyn [Piperacillin Sod-Tazobactam So] Rash     REVIEW OF SYSTEMS (Negative unless checked)  Constitutional: [] Weight loss  [] Fever  [] Chills Cardiac: [] Chest pain   [] Chest pressure   [] Palpitations   [] Shortness of breath when laying flat   [] Shortness of breath with exertion. Vascular:  [] Pain in legs with walking   [] Pain in legs at rest  [] History of DVT   [] Phlebitis   [x] Swelling in legs   [] Varicose veins    [x] Non-healing ulcers Pulmonary:   [] Uses home oxygen   [] Productive cough   [] Hemoptysis   [] Wheeze  [] COPD   [] Asthma Neurologic:  [] Dizziness   [] Seizures   [] History of stroke   [] History of TIA  [] Aphasia   [] Vissual changes   [] Weakness or numbness in arm   [] Weakness or numbness in leg Musculoskeletal:   [] Joint swelling   [x] Joint pain   [] Low back pain Hematologic:  [] Easy bruising  [] Easy bleeding   [] Hypercoagulable state   [] Anemic Gastrointestinal:  [] Diarrhea   [] Vomiting  [] Gastroesophageal reflux/heartburn   [] Difficulty swallowing. Genitourinary:  [] Chronic kidney disease   [] Difficult urination  [] Frequent urination   [] Blood in urine Skin:  [x] Rashes   [] Ulcers  Psychological:  [] History of anxiety   []  History of major depression.  Physical Examination  There were no vitals filed for this visit. There is no height or weight on file to calculate BMI. Gen: WD/WN, NAD Head: [place]/AT, No temporalis wasting.  Ear/Nose/Throat: Hearing grossly intact, nares w/o erythema or drainage Eyes: PER, EOMI, sclera nonicteric.  Neck: Supple, no large masses.   Pulmonary:  Good air movement, no audible wheezing bilaterally, no use of accessory muscles.  Cardiac: RRR, no JVD Vascular: 2-3+ edema of the right leg with severe venous changes of the right leg.  Venous ulcer noted in the dorsum of the right foot, noninfected; some odor but it appears superficial and well granulated Vessel Right Left  Radial Palpable Palpable  PT Not Palpable Not Palpable  DP Not Palpable Not Palpable  Gastrointestinal: Non-distended. No guarding/no peritoneal signs.  Musculoskeletal: M/S 5/5 throughout.  No deformity or atrophy.  Neurologic: CN 2-12 intact. Symmetrical.  Speech is fluent. Motor exam as listed above. Psychiatric: Judgment intact, Mood & affect appropriate for pt's clinical situation. Dermatologic: Venous stasis dermatitis with ulcers present on the right.  No changes consistent with  cellulitis. Lymph : + lichenification / skin changes of chronic lymphedema.  CBC Lab Results  Component Value Date   WBC 5.3 05/22/2017   HGB 13.4 05/22/2017   HCT 39.8 (L) 05/22/2017   MCV 83.3 05/22/2017   PLT 266 05/22/2017    BMET    Component Value Date/Time   NA 139 05/22/2017 0853   NA 140 04/04/2013 1238   K 4.4 05/22/2017 0853   K 4.1 04/04/2013 1238   CL 104 05/22/2017 0853   CL 108 (H) 04/04/2013 1238   CO2 28 05/22/2017 0853   CO2 30 04/04/2013 1238   GLUCOSE 90 05/22/2017 0853   GLUCOSE 98 04/04/2013 1238   BUN 13 05/22/2017 0853   BUN 19 (H) 04/04/2013 1238   CREATININE 0.91 05/22/2017 0853   CREATININE 0.87 04/04/2013 1238   CALCIUM 9.3 05/22/2017 0853   CALCIUM 8.8 04/04/2013 1238   GFRNONAA >60 05/22/2017 0853   GFRNONAA >60 04/04/2013 1238   GFRAA >60 05/22/2017 0853   GFRAA >60  04/04/2013 1238   CrCl cannot be calculated (Patient's most recent lab result is older than the maximum 21 days allowed.).  COAG Lab Results  Component Value Date   INR 2.28 05/22/2017   INR 1.00 11/30/2016    Radiology No results found.   Assessment/Plan 1. Atherosclerosis of native artery of both lower extremities with intermittent claudication (HCC)  Recommend:  The patient has evidence of atherosclerosis of the lower extremities with claudication.  The patient does not voice lifestyle limiting changes at this point in time.  Noninvasive studies do not suggest clinically significant change.  The ABIs although noncompressible at the ankle demonstrate strong triphasic Doppler signals and the toe brachial indexes are normal bilaterally.  The wound itself appears well granulated and superficial.  No invasive studies, angiography or surgery at this time The patient should continue walking and begin a more formal exercise program.  The patient should continue antiplatelet therapy and aggressive treatment of the lipid abnormalities  No changes in the patient's  medications at this time  The patient should continue wearing graduated compression socks 20-30 mmHg strength to control the mild edema.   2. Chronic venous insufficiency See #1  3. 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 Ether Griffins  4. Diabetes 1.5, managed as type 2 (HCC) Continue hypoglycemic medications as already ordered, these medications have been reviewed and there are no changes at this time.  Hgb A1C to be monitored as already arranged by primary service   5. Mixed hyperlipidemia Continue statin as ordered and reviewed, no changes at this time     Levora Dredge, MD  09/24/2020 2:50 PM

## 2020-12-24 ENCOUNTER — Telehealth (INDEPENDENT_AMBULATORY_CARE_PROVIDER_SITE_OTHER): Payer: Self-pay

## 2020-12-24 ENCOUNTER — Ambulatory Visit (INDEPENDENT_AMBULATORY_CARE_PROVIDER_SITE_OTHER): Payer: Medicare HMO | Admitting: Vascular Surgery

## 2020-12-24 NOTE — Telephone Encounter (Signed)
Patient requested refill for silvadene 1% cream 50g apply to bid to right foot ulcer and refill was called into [facility] on [facility]. Patient has been made aware

## 2021-01-07 ENCOUNTER — Encounter (INDEPENDENT_AMBULATORY_CARE_PROVIDER_SITE_OTHER): Payer: Self-pay | Admitting: Vascular Surgery

## 2021-01-07 ENCOUNTER — Ambulatory Visit (INDEPENDENT_AMBULATORY_CARE_PROVIDER_SITE_OTHER): Payer: Medicare HMO | Admitting: Vascular Surgery

## 2021-01-07 ENCOUNTER — Other Ambulatory Visit: Payer: Self-pay

## 2021-01-07 VITALS — BP 136/72 | HR 92 | Ht 71.0 in | Wt 271.0 lb

## 2021-01-07 DIAGNOSIS — E1152 Type 2 diabetes mellitus with diabetic peripheral angiopathy with gangrene: Secondary | ICD-10-CM

## 2021-01-07 DIAGNOSIS — I10 Essential (primary) hypertension: Secondary | ICD-10-CM

## 2021-01-07 DIAGNOSIS — I890 Lymphedema, not elsewhere classified: Secondary | ICD-10-CM | POA: Diagnosis not present

## 2021-01-07 DIAGNOSIS — Z794 Long term (current) use of insulin: Secondary | ICD-10-CM

## 2021-01-07 DIAGNOSIS — I872 Venous insufficiency (chronic) (peripheral): Secondary | ICD-10-CM

## 2021-01-07 DIAGNOSIS — I70213 Atherosclerosis of native arteries of extremities with intermittent claudication, bilateral legs: Secondary | ICD-10-CM

## 2021-01-07 DIAGNOSIS — E782 Mixed hyperlipidemia: Secondary | ICD-10-CM

## 2021-01-07 NOTE — Progress Notes (Signed)
MRN : [id number]  [name] is a 70 y.o. (19-May-1951) male who presents with chief complaint of  Chief Complaint  Patient presents with  . Follow-up    3 mo no studies  .  History of Present Illness:   The patient returns to the office for followup evaluation regarding leg swelling.  The swelling has persisted and the pain associated with swelling continues. There have not been any interval development of a ulcerations or wounds.  Since the previous visit the patient has been wearing graduated compression stockings and has noted little if any improvement in the lymphedema. The patient has been using compression routinely morning until night.  The patient also states elevation during the day and exercise is being done too.   Current Meds  Medication Sig  . ACCU-CHEK GUIDE test strip Use 1 strip via meter four times a day  to monitor blood glucose  . Accu-Chek Softclix Lancets lancets   . acetaminophen (TYLENOL) 325 MG tablet Take 650 mg by mouth every 6 (six) hours as needed.  Marland Kitchen atorvastatin (LIPITOR) 10 MG tablet TAKE 1 TABLET  EACH  EVENING FOR HIGH CHOLESTEROL  . atorvastatin (LIPITOR) 20 MG tablet Take 20 mg by mouth daily.  . bacitracin ointment Apply in a thin layer to the wound of the right foot and cover with gauze and a wrap  Do this daily (once per day)  . docusate sodium (COLACE) 100 MG capsule Take 1 capsule (100 mg total) by mouth 2 (two) times daily.  . DROPLET INSULIN SYRINGE 31G X 5/16" 0.3 ML MISC USE FIVE TIMES DAILY AS DIRECTED  . glucose blood (ACCU-CHEK AVIVA PLUS) test strip   . insulin glargine (LANTUS) 100 UNIT/ML injection Inject 0.15 mLs (15 Units total) into the skin at bedtime.  . insulin lispro (HUMALOG) 100 UNIT/ML injection Inject 4 Units into the skin See admin instructions. Inject 4 units SQ with breakfast, inject 4 units SQ with lunch and inject 4 units SQ with dinner  . Insulin Syringe-Needle U-100 31G X 5/16" 0.3 ML MISC   .  lisinopril-hydrochlorothiazide (PRINZIDE,ZESTORETIC) 20-25 MG tablet Take by mouth.  . metFORMIN (GLUCOPHAGE) 1000 MG tablet TAKE 1 TABLET TWICE DAILY FOR DIABETES  . metFORMIN (GLUCOPHAGE) 500 MG tablet Take 500 mg by mouth 2 (two) times daily with a meal.   . Multiple Vitamins-Minerals (CENTROVITE) TABS Take 1 tablet by mouth daily.  . polyethylene glycol (MIRALAX / GLYCOLAX) packet Take 17 g by mouth daily.  . rivaroxaban (XARELTO) 20 MG TABS tablet Take 20 mg by mouth daily with supper.  . senna (SENOKOT) 8.6 MG TABS tablet Take 1 tablet by mouth 2 (two) times daily.  . silver sulfADIAZINE (SILVADENE) 1 % cream Apply 1 application topically daily. Apply to right foot ulcer BID  . Simethicone (GAS-X ULTRA STRENGTH PO) Take 1 capsule by mouth 4 (four) times daily as needed (for flatulence).    Past Medical History:  Diagnosis Date  . Arthritis   . Bladder incontinence   . Chronic kidney disease    Stage 3 Kidney disease  . Dementia (HCC)   . Diabetes mellitus without complication (HCC)   . DVT of leg (deep venous thrombosis) (HCC)    H/O RIGHT LEG 2018  . Dyspnea    occassional  . Hyperlipidemia   . Hypertension   . Peripheral vascular disease (HCC)   . Sepsis [facility])     Past Surgical History:  Procedure Laterality Date  . ABDOMINAL AORTOGRAM  W/LOWER EXTREMITY N/A 10/10/2016   Procedure: Abdominal Aortogram w/Lower Extremity;  Surgeon: Renford Dills, MD;  Location: [facility] INVASIVE CV LAB;  Service: Cardiovascular;  Laterality: N/A;  . CATARACT EXTRACTION W/PHACO Left 05/27/2018   Procedure: CATARACT EXTRACTION PHACO AND INTRAOCULAR LENS PLACEMENT (IOC);  Surgeon: Nevada Crane, MD;  Location: [facility] ORS;  Service: Ophthalmology;  Laterality: Left;  [place] 03:06.2 AP% 16.1 CDE 30.16 FLUID PACK LOT @ [id number] H  . CIRCUMCISION  1998  . EYE SURGERY Left    Retina Detachment  . GRAFT APPLICATION Right 05/29/2017   Procedure: GRAFT APPLICATION ( RIGHT FOOT );  Surgeon: Renford Dills, MD;  Location: [facility] ORS;  Service: Vascular;  Laterality: Right;  graft taken from patients right thigh  . IRRIGATION AND DEBRIDEMENT FOOT Right 10/03/2016   Procedure: IRRIGATION AND DEBRIDEMENT FOOT;  Surgeon: Gwyneth Revels, DPM;  Location: [facility] ORS;  Service: Podiatry;  Laterality: Right;  . LOWER EXTREMITY INTERVENTION  10/10/2016   Procedure: Lower Extremity Intervention;  Surgeon: Renford Dills, MD;  Location: [facility] INVASIVE CV LAB;  Service: Cardiovascular;;  . PERIPHERAL VASCULAR BALLOON ANGIOPLASTY Left 10/10/2016   Procedure: Peripheral Vascular Balloon Angioplasty;  Surgeon: Renford Dills, MD;  Location: [facility] INV[facility] CV [facility];  Service: Cardiovascular;  Laterality: Left;  . TONSILLECTOMY    . WOUND DEBRIDEMENT Right 11/07/2016   Procedure: DEBRIDEMENT OF WOUND AND BONE RIGHT FOOT AND APPLY WOUND VAC;  Surgeon: Gwyneth Revels, DPM;  Location: [facility] ORS;  Service: Podiatry;  Laterality: Right;    Social History Social History   Tobacco Use  . Smoking status: Former Smoker    Types: Pipe    Quit date: 05/23/1975    Years since quitting: 45.6  . Smokeless tobacco: Never Used  Vaping Use  . Vaping Use: Never used  Substance Use Topics  . Alcohol use: No  . Drug use: No    Family History Family History  Problem Relation Age of Onset  . Diabetes Mother   . Hypertension Mother   . Diabetes Father   . Hypertension Father     Allergies  Allergen Reactions  . Sulfa Antibiotics Rash  . Zosyn [Piperacillin Sod-Tazobactam So] Rash     REVIEW OF SYSTEMS (Negative unless checked)  Constitutional: [] Weight loss  [] Fever  [] Chills Cardiac: [] Chest pain   [] Chest pressure   [] Palpitations   [] Shortness of breath when laying flat   [] Shortness of breath with exertion. Vascular:  [x] Pain in legs with walking   [] Pain in legs at rest  [] History of DVT   [] Phlebitis   [x] Swelling in legs   [] Varicose veins   [] Non-healing ulcers Pulmonary:   [] Uses home oxygen    [] Productive cough   [] Hemoptysis   [] Wheeze  [] COPD   [] Asthma Neurologic:  [] Dizziness   [] Seizures   [] History of stroke   [] History of TIA  [] Aphasia   [] Vissual changes   [] Weakness or numbness in arm   [] Weakness or numbness in leg Musculoskeletal:   [] Joint swelling   [x] Joint pain   [] Low back pain Hematologic:  [] Easy bruising  [] Easy bleeding   [] Hypercoagulable state   [] Anemic Gastrointestinal:  [] Diarrhea   [] Vomiting  [] Gastroesophageal reflux/heartburn   [] Difficulty swallowing. Genitourinary:  [] Chronic kidney disease   [] Difficult urination  [] Frequent urination   [] Blood in urine Skin:  [] Rashes   [x] Ulcers  Psychological:  [] History of anxiety   []  History of major depression.  Physical Examination  Vitals:   01/07/21 1331  BP: 136/72  Pulse: 92  Weight: 271 lb (122.9 kg)  Height: 5\' 11"  (1.803 m)   Body mass index is 37.8 kg/m. Gen: WD/WN, NAD Head: [place]/AT, No temporalis wasting.  Ear/Nose/Throat: Hearing grossly intact, nares w/o erythema or drainage Eyes: PER, EOMI, sclera nonicteric.  Neck: Supple, no large masses.   Pulmonary:  Good air movement, no audible wheezing bilaterally, no use of accessory muscles.  Cardiac: RRR, no JVD Vascular:  Ulcer of right foot CD&I Vessel Right Left  Radial Palpable Palpable  PT Not Palpable Not Palpable  DP Not Palpable Not Palpable  Gastrointestinal: Non-distended. No guarding/no peritoneal signs.  Musculoskeletal: M/S 5/5 throughout.  No deformity or atrophy.  Neurologic: CN 2-12 intact. Symmetrical.  Speech is fluent. Motor exam as listed above. Psychiatric: Judgment intact, Mood & affect appropriate for pt's clinical situation. Dermatologic: Venous rashes no ulcers noted.  No changes consistent with cellulitis. Lymph : + lichenification or skin changes of chronic lymphedema.  CBC Lab Results  Component Value Date   WBC 5.3 05/22/2017   HGB 13.4 05/22/2017   HCT 39.8 (L) 05/22/2017   MCV 83.3 05/22/2017   PLT  266 05/22/2017    BMET    Component Value Date/Time   NA 139 05/22/2017 0853   NA 140 04/04/2013 1238   K 4.4 05/22/2017 0853   K 4.1 04/04/2013 1238   CL 104 05/22/2017 0853   CL 108 (H) 04/04/2013 1238   CO2 28 05/22/2017 0853   CO2 30 04/04/2013 1238   GLUCOSE 90 05/22/2017 0853   GLUCOSE 98 04/04/2013 1238   BUN 13 05/22/2017 0853   BUN 19 (H) 04/04/2013 1238   CREATININE 0.91 05/22/2017 0853   CREATININE 0.87 04/04/2013 1238   CALCIUM 9.3 05/22/2017 0853   CALCIUM 8.8 04/04/2013 1238   GFRNONAA >60 05/22/2017 0853   GFRNONAA >60 04/04/2013 1238   GFRAA >60 05/22/2017 0853   GFRAA >60 04/04/2013 1238   CrCl cannot be calculated (Patient's most recent lab result is older than the maximum 21 days allowed.).  COAG Lab Results  Component Value Date   INR 2.28 05/22/2017   INR 1.00 11/30/2016    Radiology No results found.   Assessment/Plan 1. Lymphedema Recommend:  No surgery or intervention at this point in time.    ABI's Rt=[place] (triphasic signals) and [place]  (triphasic signals.  TBI's are normal bilaterally   I have reviewed my previous discussion with the patient regarding swelling and why it causes symptoms.  Patient will continue wearing graduated compression stockings class 1 (20-30 mmHg) on a daily basis. The patient will  beginning wearing the stockings first thing in the morning and removing them in the evening. The patient is instructed specifically not to sleep in the stockings.    In addition, behavioral modification including several periods of elevation of the lower extremities during the day will be continued.  This was reviewed with the patient during the initial visit.  The patient will also continue routine exercise, especially walking on a daily basis as was discussed during the initial visit.    Despite conservative treatments including graduated compression therapy class 1 and behavioral modification including exercise and elevation the patient   has not obtained adequate control of the lymphedema.  The patient still has stage 3 lymphedema and therefore, I believe that a lymph pump should be added to improve the control of the patient's lymphedema.  Additionally, a lymph pump is warranted because it will reduce the risk of cellulitis and ulceration  in the future.  Patient should follow-up in six months    2. Chronic venous insufficiency Recommend:  No surgery or intervention at this point in time.    ABI's Rt=[place] (triphasic signals) and Lt [place] (triphasic signals.  TBI's are normal bilaterally   I have reviewed my previous discussion with the patient regarding swelling and why it causes symptoms.  Patient will continue wearing graduated compression stockings class 1 (20-30 mmHg) on a daily basis. The patient will  beginning wearing the stockings first thing in the morning and removing them in the evening. The patient is instructed specifically not to sleep in the stockings.    In addition, behavioral modification including several periods of elevation of the lower extremities during the day will be continued.  This was reviewed with the patient during the initial visit.  The patient will also continue routine exercise, especially walking on a daily basis as was discussed during the initial visit.    Despite conservative treatments including graduated compression therapy class 1 and behavioral modification including exercise and elevation the patient  has not obtained adequate control of the lymphedema.  The patient still has stage 3 lymphedema and therefore, I believe that a lymph pump should be added to improve the control of the patient's lymphedema.  Additionally, a lymph pump is warranted because it will reduce the risk of cellulitis and ulceration in the future.  Patient should follow-up in six months    3. Atherosclerosis of native artery of both lower extremities with intermittent claudication (HCC) Recommend:  I do not find  evidence of life style limiting vascular disease. The patient specifically denies life style limitation.  Previous noninvasive studies including ABI's of the legs do not identify critical vascular problems.  Previous ABI's Rt=[place] (triphasic signals) and Lt  (triphasic signals.  TBI's are normal bilaterally   The patient should continue walking and begin a more formal exercise program. The patient should continue his antiplatelet therapy and aggressive treatment of the lipid abnormalities.  The patient should begin wearing graduated compression socks 15-20 mmHg strength to control her mild edema.  Patient will follow-up with me on a PRN basis   4. Primary hypertension Continue antihypertensive medications as already ordered, these medications have been reviewed and there are no changes at this time.   5. Type 2 diabetes mellitus with diabetic peripheral angiopathy and gangrene, with long-term current use of insulin (HCC) Continue hypoglycemic medications as already ordered, these medications have been reviewed and there are no changes at this time.  Hgb A1C to be monitored as already arranged by primary service   6. Mixed hyperlipidemia Continue statin as ordered and reviewed, no changes at this time   Levora Dredge, MD  01/07/2021 1:59 PM

## 2021-01-13 ENCOUNTER — Encounter (INDEPENDENT_AMBULATORY_CARE_PROVIDER_SITE_OTHER): Payer: Self-pay | Admitting: Vascular Surgery

## 2021-05-03 ENCOUNTER — Other Ambulatory Visit: Payer: Self-pay

## 2021-05-03 ENCOUNTER — Ambulatory Visit
Admission: RE | Admit: 2021-05-03 | Discharge: 2021-05-03 | Disposition: A | Discharge status: Home / Self Care | Payer: Medicare HMO | Source: Ambulatory Visit | Attending: Physician Assistant | Admitting: Physician Assistant

## 2021-05-03 ENCOUNTER — Encounter: Payer: Medicare HMO | Attending: Physician Assistant | Admitting: Physician Assistant

## 2021-05-03 ENCOUNTER — Other Ambulatory Visit: Payer: Self-pay | Admitting: Physician Assistant

## 2021-05-03 DIAGNOSIS — Z89421 Acquired absence of other right toe(s): Secondary | ICD-10-CM | POA: Insufficient documentation

## 2021-05-03 DIAGNOSIS — S91309A Unspecified open wound, unspecified foot, initial encounter: Secondary | ICD-10-CM | POA: Diagnosis present

## 2021-05-03 DIAGNOSIS — L97522 Non-pressure chronic ulcer of other part of left foot with fat layer exposed: Secondary | ICD-10-CM | POA: Diagnosis not present

## 2021-05-03 DIAGNOSIS — Z86718 Personal history of other venous thrombosis and embolism: Secondary | ICD-10-CM | POA: Insufficient documentation

## 2021-05-03 DIAGNOSIS — E11621 Type 2 diabetes mellitus with foot ulcer: Secondary | ICD-10-CM | POA: Diagnosis not present

## 2021-05-03 DIAGNOSIS — M7989 Other specified soft tissue disorders: Secondary | ICD-10-CM | POA: Diagnosis not present

## 2021-05-03 DIAGNOSIS — Z7901 Long term (current) use of anticoagulants: Secondary | ICD-10-CM | POA: Diagnosis not present

## 2021-05-03 DIAGNOSIS — I10 Essential (primary) hypertension: Secondary | ICD-10-CM | POA: Diagnosis not present

## 2021-05-03 DIAGNOSIS — L97512 Non-pressure chronic ulcer of other part of right foot with fat layer exposed: Secondary | ICD-10-CM | POA: Diagnosis present

## 2021-05-03 DIAGNOSIS — X58XXXA Exposure to other specified factors, initial encounter: Secondary | ICD-10-CM | POA: Diagnosis not present

## 2021-05-03 NOTE — Progress Notes (Signed)
[name] ([id number]) Visit Report for 05/03/2021 Chief Complaint Document Details Patient Name: [name], [name]. Date of Service: 05/03/2021 12:45 PM Medical Record Number: [id number] Patient Account Number: [id number] Date of Birth/Sex: 1951-06-28 (70 y.o. M) Treating RN: Dolan Amen Primary Care Provider: Beverlyn Roux Other Clinician: Referring Provider: Samara Deist Treating Provider/Extender: Skipper Cliche in Treatment: 0 Information Obtained from: Patient Chief Complaint Bilateral foot ulcers Electronic Signature(s) Signed: 05/03/2021 1:55:13 PM By: Worthy Keeler PA-C Entered By: Worthy Keeler on 05/03/2021 13:55:13 [name] ([id number]) -------------------------------------------------------------------------------- Debridement Details Patient Name: [name] Date of Service: 05/03/2021 12:45 PM Medical Record Number: [id number] Patient Account Number: [id number] Date of Birth/Sex: [birth year] (70 y.o. M) Treating RN: Dolan Amen Primary Care Provider: Beverlyn Roux Other Clinician: Referring Provider: Samara Deist Treating Provider/Extender: Jeri Cos Weeks in Treatment: 0 Debridement Performed for Wound #1 Right,Proximal,Lateral Foot Assessment: Performed By: Physician Tommie Sams., PA-C Debridement Type: Debridement Severity of Tissue Pre Debridement: Fat layer exposed Level of Consciousness (Pre- Awake and Alert procedure): Pre-procedure Verification/Time Out Yes - 14:02 Taken: Start Time: 14:02 Total Area Debrided (L x W): 1.9 (cm) x 2 (cm) = 3.8 (cm) Tissue and other material Viable, Non-Viable, Callus, Slough, Subcutaneous, Skin: Epidermis, Slough debrided: Level: Skin/Subcutaneous Tissue Debridement Description: Excisional Instrument: Curette Bleeding: Minimum Hemostasis Achieved: Pressure Response to Treatment: Procedure was tolerated well Level of Consciousness (Post- Awake and Alert procedure): Post  Debridement Measurements of Total Wound Length: (cm) 1.9 Width: (cm) 2 Depth: (cm) 0.6 Volume: (cm) 1.791 Character of Wound/Ulcer Post Debridement: Stable Severity of Tissue Post Debridement: Fat layer exposed Post Procedure Diagnosis Same as Pre-procedure Electronic Signature(s) Signed: 05/03/2021 3:53:03 PM By: Dolan Amen RN Signed: 05/03/2021 4:48:27 PM By: Worthy Keeler PA-C Entered By: Dolan Amen on 05/03/2021 14:10:27 [name] ([id number]) -------------------------------------------------------------------------------- Debridement Details Patient Name: [name] Date of Service: 05/03/2021 12:45 PM Medical Record Number: [id number] Patient Account Number: [id number] Date of Birth/Sex: [birth year] (70 y.o. M) Treating RN: Dolan Amen Primary Care Provider: Beverlyn Roux Other Clinician: Referring Provider: Samara Deist Treating Provider/Extender: Jeri Cos Weeks in Treatment: 0 Debridement Performed for Wound #2 Right,Distal,Lateral Foot Assessment: Performed By: Physician Tommie Sams., PA-C Debridement Type: Debridement Severity of Tissue Pre Debridement: Fat layer exposed Level of Consciousness (Pre- Awake and Alert procedure): Pre-procedure Verification/Time Out Yes - 14:02 Taken: Start Time: 14:02 Total Area Debrided (L x W): 2.4 (cm) x 1.3 (cm) = 3.12 (cm) Tissue and other material Viable, Non-Viable, Callus, Slough, Subcutaneous, Skin: Epidermis, Slough debrided: Level: Skin/Subcutaneous Tissue Debridement Description: Excisional Instrument: Curette Bleeding: Minimum Hemostasis Achieved: Pressure Response to Treatment: Procedure was tolerated well Level of Consciousness (Post- Awake and Alert procedure): Post Debridement Measurements of Total Wound Length: (cm) 2.4 Width: (cm) 1.3 Depth: (cm) 0.2 Volume: (cm) 0.49 Character of Wound/Ulcer Post Debridement: Stable Severity of Tissue Post Debridement: Fat layer  exposed Post Procedure Diagnosis Same as Pre-procedure Electronic Signature(s) Signed: 05/03/2021 3:53:03 PM By: Dolan Amen RN Signed: 05/03/2021 4:48:27 PM By: Worthy Keeler PA-C Entered By: Dolan Amen on 05/03/2021 14:10:35 [name] ([id number]) -------------------------------------------------------------------------------- Debridement Details Patient Name: [name] Date of Service: 05/03/2021 12:45 PM Medical Record Number: [id number] Patient Account Number: [id number] Date of Birth/Sex: March 02, 1951 (70 y.o. M) Treating RN: Dolan Amen Primary Care Provider: Beverlyn Roux Other Clinician: Referring Provider: Samara Deist Treating Provider/Extender: Jeri Cos Weeks in Treatment: 0 Debridement Performed for Wound #3 Right Toe Second Assessment: Performed By:  Physician Jeri Cos E., PA-C Debridement Type: Debridement Severity of Tissue Pre Debridement: Fat layer exposed Level of Consciousness (Pre- Awake and Alert procedure): Pre-procedure Verification/Time Out Yes - 14:02 Taken: Start Time: 14:02 Total Area Debrided (L x W): 3 (cm) x 1 (cm) = 3 (cm) Tissue and other material Viable, Non-Viable, Subcutaneous, Skin: Epidermis debrided: Level: Skin/Subcutaneous Tissue Debridement Description: Excisional Instrument: Curette Bleeding: Minimum Hemostasis Achieved: Pressure Response to Treatment: Procedure was tolerated well Level of Consciousness (Post- Awake and Alert procedure): Post Debridement Measurements of Total Wound Length: (cm) 3 Width: (cm) 1 Depth: (cm) 0.2 Volume: (cm) 0.471 Character of Wound/Ulcer Post Debridement: Stable Severity of Tissue Post Debridement: Fat layer exposed Post Procedure Diagnosis Same as Pre-procedure Electronic Signature(s) Signed: 05/03/2021 3:53:03 PM By: Dolan Amen RN Signed: 05/03/2021 4:48:27 PM By: Worthy Keeler PA-C Entered By: Dolan Amen on 05/03/2021 14:11:42 [name] ([id number]) -------------------------------------------------------------------------------- Debridement Details Patient Name: [name] Date of Service: 05/03/2021 12:45 PM Medical Record Number: [id number] Patient Account Number: [id number] Date of Birth/Sex: Mar 30, 1951 (70 y.o. M) Treating RN: Dolan Amen Primary Care Provider: Beverlyn Roux Other Clinician: Referring Provider: Samara Deist Treating Provider/Extender: Jeri Cos Weeks in Treatment: 0 Debridement Performed for Wound #4 Right Toe Great Assessment: Performed By: Physician Tommie Sams., PA-C Debridement Type: Debridement Severity of Tissue Pre Debridement: Fat layer exposed Level of Consciousness (Pre- Awake and Alert procedure): Pre-procedure Verification/Time Out Yes - 14:02 Taken: Start Time: 14:02 Total Area Debrided (L x W): 0.3 (cm) x 0.3 (cm) = 0.09 (cm) Tissue and other material Viable, Non-Viable, Subcutaneous, Skin: Epidermis debrided: Level: Skin/Subcutaneous Tissue Debridement Description: Excisional Instrument: Curette Bleeding: Minimum Hemostasis Achieved: Pressure Response to Treatment: Procedure was tolerated well Level of Consciousness (Post- Awake and Alert procedure): Post Debridement Measurements of Total Wound Length: (cm) 0.3 Width: (cm) 0.3 Depth: (cm) 0.2 Volume: (cm) 0.014 Character of Wound/Ulcer Post Debridement: Stable Severity of Tissue Post Debridement: Fat layer exposed Post Procedure Diagnosis Same as Pre-procedure Electronic Signature(s) Signed: 05/03/2021 3:53:03 PM By: Dolan Amen RN Signed: 05/03/2021 4:48:27 PM By: Worthy Keeler PA-C Entered By: Dolan Amen on 05/03/2021 14:13:03 [name] ([id number]) -------------------------------------------------------------------------------- Debridement Details Patient Name: [name] Date of Service: 05/03/2021 12:45 PM Medical Record Number: [id number] Patient Account Number:  [id number] Date of Birth/Sex: [birth year] (70 y.o. M) Treating RN: Dolan Amen Primary Care Provider: Beverlyn Roux Other Clinician: Referring Provider: Samara Deist Treating Provider/Extender: Jeri Cos Weeks in Treatment: 0 Debridement Performed for Wound #5 Left Toe Second Assessment: Performed By: Physician Tommie Sams., PA-C Debridement Type: Debridement Severity of Tissue Pre Debridement: Fat layer exposed Level of Consciousness (Pre- Awake and Alert procedure): Pre-procedure Verification/Time Out Yes - 14:02 Taken: Start Time: 14:02 Total Area Debrided (L x W): 0.5 (cm) x 0.2 (cm) = 0.1 (cm) Tissue and other material Viable, Non-Viable, Subcutaneous, Skin: Epidermis debrided: Level: Skin/Subcutaneous Tissue Debridement Description: Excisional Instrument: Curette Bleeding: Minimum Hemostasis Achieved: Pressure Response to Treatment: Procedure was tolerated well Level of Consciousness (Post- Awake and Alert procedure): Post Debridement Measurements of Total Wound Length: (cm) 0.5 Width: (cm) 0.2 Depth: (cm) 0.1 Volume: (cm) 0.008 Character of Wound/Ulcer Post Debridement: Stable Severity of Tissue Post Debridement: Fat layer exposed Post Procedure Diagnosis Same as Pre-procedure Electronic Signature(s) Signed: 05/03/2021 3:53:03 PM By: Dolan Amen RN Signed: 05/03/2021 4:48:27 PM By: Worthy Keeler PA-C Entered By: Dolan Amen on 05/03/2021 14:13:48 [name] ([id number]) -------------------------------------------------------------------------------- HPI Details Patient  Name: [name], [name]. Date of Service: 05/03/2021 12:45 PM Medical Record Number: [id number] Patient Account Number: [id number] Date of Birth/Sex: 01/30/1951 (70 y.o. 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 Has been seen by [facility]iatry [facility] for a number of years it sounds like at least 4 years. With that being said they have tried a split thickness skin graft along with multiple other topical remedies over the time they have been seeing him over the past several years. Unfortunately they are not able to get this closed I did refer him to [place] for further evaluation and treatment. The patient tells me has been quite sometime since he remembers having an x-ray and again we could not find anything recent when looking through his chart. Fortunately there does not appear to be any signs of active infection systemically which is great news. No fevers, chills, nausea, vomiting, or diarrhea. The patient does have diabetes with his most recent hemoglobin A1c being November 14, 2020 at 9.1. He also does have evidence of lymphedema based on what I am seeing today. 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 Electronic Signature(s) Signed: 05/03/2021 3:44:25 PM By: Worthy Keeler PA-C Entered By: Worthy Keeler on 05/03/2021 15:44:25 [name] ([id number]) -------------------------------------------------------------------------------- Physical Exam Details Patient Name: [name], [name] Date of Service: 05/03/2021 12:45 PM Medical Record Number: [id number] Patient Account Number: [id number] Date of Birth/Sex: 09/16/1950 (70 y.o. M) Treating RN: Dolan Amen Primary Care Provider: Beverlyn Roux Other Clinician: Referring  Provider: Samara Deist Treating Provider/Extender: Skipper Cliche in Treatment: 0 Constitutional patient is hypertensive.. pulse regular and within target range for patient.Marland Kitchen respirations regular, non-labored and within target range for patient.Marland Kitchen temperature within target range for patient.. Well-nourished and well-hydrated in no acute distress. Eyes conjunctiva clear no eyelid edema noted. pupils equal round and reactive to light and accommodation. Ears, Nose, Mouth, and Throat no gross abnormality of ear auricles or external auditory canals. normal hearing noted during conversation. mucus membranes moist. Respiratory normal breathing without difficulty. Cardiovascular 2+ dorsalis pedis/posterior tibialis pulses. Patient has bilateral right greater than left lymphedema.. Musculoskeletal normal gait and posture. no significant deformity or arthritic changes, no loss or range of motion, no clubbing. Psychiatric this patient is able to make decisions and demonstrates good insight into disease process. Alert and Oriented x 3. pleasant and cooperative. Notes Upon inspection patient's wound bed actually showed signs of decent granulation epithelization at this point in regard to the wounds in general he does have several small areas which are somewhat interesting and that they are very tiny and circular on his toes of each foot. I did have to perform some sharp debridement clear away some of the skin around the edges of the wound as well as slough down to good subcutaneous tissue I am hopeful this will help these areas to close up. With that being said in general he does seem to have  some slough and biofilm on the surface of the wounds on the lateral foot wound at the surgical site as well. I did perform debridement to clear away the necrotic debris he tolerated that today without complication and postdebridement wound bed appears to be doing much better. Electronic Signature(s) Signed:  05/03/2021 3:45:34 PM By: Worthy Keeler PA-C Entered By: Worthy Keeler on 05/03/2021 15:45:34 [name] ([id number]) -------------------------------------------------------------------------------- Physician Orders Details Patient Name: [name] Date of Service: 05/03/2021 12:45 PM Medical Record Number: [id number] Patient Account Number: [id number] Date of Birth/Sex: 07/15/1951 (70 y.o. 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 o Nurse Visit as needed Bathing/ Shower/ Hygiene o May shower with wound dressing protected with water repellent cover or cast protector. o No tub bath. Edema Control - Lymphedema / Segmental Compressive Device / Other Right Lower Extremity o Optional: One layer of unna paste to top of compression wrap (to act as an anchor). o Elevate legs to the level of the heart and pump ankles as often as possible o Elevate leg(s) parallel to the floor when sitting. o Compression Pump: Use compression pump on left lower extremity for 60 minutes, twice daily. o Compression Pump: Use compression pump on right lower extremity for 60 minutes, twice daily. Wound Treatment Wound #1 - Foot Wound Laterality: Right, Lateral, Proximal Cleanser: Wound Cleanser 1 x  Per Week/30 Days Discharge Instructions: Wash your hands with soap and water. Remove old dressing, discard into plastic bag and place into trash. Cleanse the wound with Wound Cleanser prior to applying a clean dressing using gauze sponges, not tissues or cotton balls. Do not scrub or use excessive force. Pat dry using gauze sponges, not tissue or cotton balls. Primary Dressing: Hydrofera Blue Ready Transfer Foam, 2.5x2.5 (in/in) 1 x Per Week/30 Days Discharge Instructions: Apply Hydrofera Blue Ready to wound bed as directed Secondary Dressing: ABD Pad 5x9 (in/in) 1 x Per Week/30 Days Discharge Instructions: Cover with ABD pad Compression Wrap: Profore Lite LF 3 Multilayer Compression Bandaging System 1 x Per Week/30 Days Discharge Instructions: Apply 3 multi-layer wrap as prescribed. Wound #2 - Foot Wound Laterality: Right, Lateral, Distal Cleanser: [place] Ancillary Kit - 15 Day Supply (DME) (Generic) 1 x Per Week/30 Days Discharge Instructions: Use supplies as instructed; Kit contains: (15) Saline Bullets; (15) 3x3 Gauze; 15 pr Gloves Cleanser: Wound Cleanser 1 x Per Week/30 Days Discharge Instructions: Wash your hands with soap and water. Remove old dressing, discard into plastic bag and place into trash. Cleanse the wound with Wound Cleanser prior to applying a clean dressing using gauze sponges, not tissues or cotton balls. Do not scrub or use excessive force. Pat dry using gauze sponges, not tissue or cotton balls. Primary Dressing: Hydrofera Blue Ready Transfer Foam, 2.5x2.5 (in/in) (DME) (Generic) 1 x Per Week/30 Days Discharge Instructions: Apply Hydrofera Blue Ready to wound bed as directed [name], [name] ([id number]) Secondary Dressing: ABD Pad  5x9 (in/in) 1 x Per Week/30 Days Discharge Instructions: Cover with ABD pad Compression Wrap: Profore Lite LF 3 Multilayer Compression Bandaging System 1 x Per Week/30 Days Discharge Instructions: Apply 3 multi-layer wrap as  prescribed. Wound #3 - Toe Second Wound Laterality: Right Cleanser: Wound Cleanser 3 x Per Week/30 Days Discharge Instructions: Wash your hands with soap and water. Remove old dressing, discard into plastic bag and place into trash. Cleanse the wound with Wound Cleanser prior to applying a clean dressing using gauze sponges, not tissues or cotton balls. Do not scrub or use excessive force. Pat dry using gauze sponges, not tissue or cotton balls. Primary Dressing: Hydrofera Blue Ready Transfer Foam, 2.5x2.5 (in/in) 3 x Per Week/30 Days Discharge Instructions: Apply Hydrofera Blue Ready to wound bed as directed Secondary Dressing: Coverlet Latex-Free Fabric Adhesive Dressings 3 x Per Week/30 Days Discharge Instructions: 1.5 x 2 Wound #4 - Toe Great Wound Laterality: Right Cleanser: Wound Cleanser 3 x Per Week/30 Days Discharge Instructions: Wash your hands with soap and water. Remove old dressing, discard into plastic bag and place into trash. Cleanse the wound with Wound Cleanser prior to applying a clean dressing using gauze sponges, not tissues or cotton balls. Do not scrub or use excessive force. Pat dry using gauze sponges, not tissue or cotton balls. Primary Dressing: Hydrofera Blue Ready Transfer Foam, 2.5x2.5 (in/in) 3 x Per Week/30 Days Discharge Instructions: Apply Hydrofera Blue Ready to wound bed as directed Secondary Dressing: Coverlet Latex-Free Fabric Adhesive Dressings 3 x Per Week/30 Days Discharge Instructions: 1.5 x 2 Wound #5 - Toe Second Wound Laterality: Left Cleanser: Wound Cleanser 3 x Per Week/30 Days Discharge Instructions: Wash your hands with soap and water. Remove old dressing, discard into plastic bag and place into trash. Cleanse the wound with Wound Cleanser prior to applying a clean dressing using gauze sponges, not tissues or cotton balls. Do not scrub or use excessive force. Pat dry using gauze sponges, not tissue or cotton balls. Primary Dressing: Hydrofera  Blue Ready Transfer Foam, 2.5x2.5 (in/in) 3 x Per Week/30 Days Discharge Instructions: Apply Hydrofera Blue Ready to wound bed as directed Secondary Dressing: Coverlet Latex-Free Fabric Adhesive Dressings 3 x Per Week/30 Days Discharge Instructions: 1.5 x 2 Radiology o X-ray, foot - Right and Left foot Electronic Signature(s) Signed: 05/03/2021 3:53:03 PM By: Dolan Amen RN Signed: 05/03/2021 4:48:27 PM By: Worthy Keeler PA-C Entered By: Dolan Amen on 05/03/2021 15:01:44 [name] ([id number]) -------------------------------------------------------------------------------- Problem List Details Patient Name: [name] Date of Service: 05/03/2021 12:45 PM Medical Record Number: [id number] Patient Account Number: [id number] Date of Birth/Sex: [birth year] (70 y.o. 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 Date of Service: 05/03/2021 12:45 PM Medical Record Number: [id number] Patient Account Number: [id number] Date of Birth/Sex: 04-26-1951 (70 y.o. 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 Has been seen [facility] for a number of years it sounds like at least 4 years. With that being said they have tried a split thickness skin graft along with multiple other topical remedies over the time they have been seeing him over the past several years. Unfortunately they are not able to get this closed I did refer him to [place] for further evaluation and treatment. The patient tells me has been quite sometime since he remembers having an x-ray and again we could not find anything recent when looking through his chart. Fortunately there does not appear to be any signs of active infection systemically which is great news. No fevers, chills, nausea, vomiting, or diarrhea. The patient does have diabetes with his most recent hemoglobin A1c being November 14, 2020 at 9.1. He also does have evidence of lymphedema based on what I am seeing today. 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 Patient History Information obtained from Patient, Chart. Allergies No Known Drug Allergies Social  History Former smoker - quit 40 years ago. Medical History Eyes Patient has history of Cataracts Hematologic/Lymphatic Patient has history of Lymphedema Cardiovascular Patient has history of Deep Vein Thrombosis, Hypertension Endocrine Patient has history of Type II Diabetes Musculoskeletal Patient has history of Osteoarthritis, Osteomyelitis Neurologic Denies history of Seizure Disorder Patient is treated with Insulin, Oral Agents. Blood sugar is tested. Review of Systems (ROS) Constitutional Symptoms (General Health) Denies complaints or symptoms of Fatigue, Fever, Chills, Marked Weight Change. Eyes Complains or has symptoms of Vision Changes. Denies complaints or symptoms of Dry Eyes, Glasses / Contacts. Ear/Nose/Mouth/Throat Denies complaints or symptoms of Difficult clearing ears, Sinusitis. Hematologic/Lymphatic Denies complaints or symptoms of Bleeding / Clotting Disorders, Human Immunodeficiency Virus. Respiratory Complains or has symptoms of Shortness of Breath. Denies complaints or symptoms of Chronic or frequent coughs. Cardiovascular Complains or has symptoms of LE edema. Denies complaints or symptoms of Chest pain. Gastrointestinal Denies complaints or symptoms of Frequent diarrhea, Nausea, Vomiting. Endocrine Denies complaints or symptoms of Hepatitis, Thyroid disease, Polydypsia (Excessive Thirst). Genitourinary [name], [name] ([id number]) Denies complaints or symptoms of Kidney failure/ Dialysis, Incontinence/dribbling. Immunological Denies complaints or symptoms of Hives, Itching. Integumentary (Skin) Complains or has symptoms of Wounds. Denies complaints or symptoms of Bleeding or bruising tendency, Breakdown, Swelling. Musculoskeletal Denies complaints or symptoms of Muscle Pain, Muscle Weakness. Neurologic Denies complaints or symptoms of Numbness/parasthesias, Focal/Weakness. Psychiatric Denies complaints or symptoms of Anxiety,  Claustrophobia. Objective Constitutional patient is hypertensive.. pulse regular and within target range for patient.Marland Kitchen respirations regular, non-labored and within target range for patient.Marland Kitchen temperature within target range for patient.. Well-nourished and well-hydrated in no acute distress. Vitals Time  Taken: 1:02 PM, Height: 71 in, Source: Stated, Weight: 255 lbs, Source: Measured, BMI: 35.6, Temperature: 98.3 F, Pulse: 83 bpm, Respiratory Rate: 18 breaths/min, Blood Pressure: 163/70 mmHg. Eyes conjunctiva clear no eyelid edema noted. pupils equal round and reactive to light and accommodation. Ears, Nose, Mouth, and Throat no gross abnormality of ear auricles or external auditory canals. normal hearing noted during conversation. mucus membranes moist. Respiratory normal breathing without difficulty. Cardiovascular 2+ dorsalis pedis/posterior tibialis pulses. Patient has bilateral right greater than left lymphedema.. Musculoskeletal normal gait and posture. no significant deformity or arthritic changes, no loss or range of motion, no clubbing. Psychiatric this patient is able to make decisions and demonstrates good insight into disease process. Alert and Oriented x 3. pleasant and cooperative. General Notes: Upon inspection patient's wound bed actually showed signs of decent granulation epithelization at this point in regard to the wounds in general he does have several small areas which are somewhat interesting and that they are very tiny and circular on his toes of each foot. I did have to perform some sharp debridement clear away some of the skin around the edges of the wound as well as slough down to good subcutaneous tissue I am hopeful this will help these areas to close up. With that being said in general he does seem to have some slough and biofilm on the surface of the wounds on the lateral foot wound at the surgical site as well. I did perform debridement to clear away the  necrotic debris he tolerated that today without complication and postdebridement wound bed appears to be doing much better. Integumentary (Hair, Skin) Wound #1 status is Open. Original cause of wound was Gradually Appeared. The date acquired was: 05/03/2020. The wound is located on the Right,Proximal,Lateral Foot. The wound measures 1.9cm length x 2cm width x 0.6cm depth; 2.985cm^2 area and 1.791cm^3 volume. There is Fat Layer (Subcutaneous Tissue) exposed. There is no tunneling noted, however, there is undermining starting at 6:00 and ending at 12:00 with a maximum distance of 1cm. There is a large amount of serosanguineous drainage noted. There is medium (34-66%) red, pink granulation within the wound bed. There is a medium (34-66%) amount of necrotic tissue within the wound bed including Adherent Slough. Wound #2 status is Open. Original cause of wound was Gradually Appeared. The date acquired was: 05/03/2020. The wound is located on the Right,Distal,Lateral Foot. The wound measures 2.4cm length x 1.3cm width x 0.2cm depth; 2.45cm^2 area and 0.49cm^3 volume. There is Fat Layer (Subcutaneous Tissue) exposed. There is no tunneling or undermining noted. There is a medium amount of serosanguineous drainage noted. There is large (67-100%) red granulation within the wound bed. There is a small (1-33%) amount of necrotic tissue within the wound bed including Adherent Slough. Wound #3 status is Open. Original cause of wound was Gradually Appeared. The date acquired was: 05/03/2021. The wound is located on the Right Toe Second. The wound measures 3cm length x 1cm width x 0.2cm depth; 2.356cm^2 area and 0.471cm^3 volume. There is Fat Layer (Subcutaneous Tissue) exposed. There is no tunneling or undermining noted. There is a medium amount of serosanguineous drainage noted. There is no granulation within the wound bed. There is no necrotic tissue within the wound bed. Wound #4 status is Open. Original cause of  wound was Gradually Appeared. The date acquired was: 05/03/2021. The wound is located on the Right Toe Great. The wound measures 0.3cm length x 0.3cm width x 0.2cm depth; 0.071cm^2 area and 0.014cm^3 volume.  There is no tunneling or undermining noted. There is a medium amount of serosanguineous drainage noted. There is no granulation within the wound bed. There is no [name], [name]. ([id number]) necrotic tissue within the wound bed. Wound #5 status is Open. Original cause of wound was Gradually Appeared. The date acquired was: 05/03/2021. The wound is located on the Left Toe Second. The wound measures 0.5cm length x 0.2cm width x 0.1cm depth; 0.079cm^2 area and 0.008cm^3 volume. There is Fat Layer (Subcutaneous Tissue) exposed. There is no tunneling or undermining noted. There is a medium amount of serosanguineous drainage noted. There is large (67-100%) red granulation within the wound bed. There is no necrotic tissue within the wound bed. 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 There was a Excisional Skin/Subcutaneous Tissue Debridement with a total area of 3.8 sq cm performed by Tommie Sams., PA-C. With the following instrument(s): Curette to remove Viable and Non-Viable tissue/material. Material removed includes Callus, Subcutaneous Tissue, Slough, and Skin: Epidermis. A time out was conducted at 14:02, prior to the start of the  procedure. A Minimum amount of bleeding was controlled with Pressure. The procedure was tolerated well. Post Debridement Measurements: 1.9cm length x 2cm width x 0.6cm depth; 1.791cm^3 volume. Character of Wound/Ulcer Post Debridement is stable. Severity of Tissue Post Debridement is: Fat layer exposed. Post procedure Diagnosis Wound #1: Same as Pre-Procedure Wound #2 Pre-procedure diagnosis of Wound #2 is a Diabetic Wound/Ulcer of the Lower Extremity located on the Right,Distal,Lateral Foot .Severity of Tissue Pre Debridement is: Fat layer exposed. There was a Excisional Skin/Subcutaneous Tissue Debridement with a total area of 3.12 sq cm performed by Tommie Sams., PA-C. With the following instrument(s): Curette to remove Viable and Non-Viable tissue/material. Material removed includes Callus, Subcutaneous Tissue, Slough, and Skin: Epidermis. A time out was conducted at 14:02, prior to the start of the procedure. A Minimum amount of bleeding was controlled with Pressure. The procedure was tolerated well. Post Debridement Measurements: 2.4cm length x 1.3cm width x 0.2cm depth; 0.49cm^3 volume. Character of Wound/Ulcer Post Debridement is stable. Severity of Tissue Post Debridement is: Fat layer exposed. Post procedure Diagnosis Wound #2: Same as Pre-Procedure Wound #3 Pre-procedure diagnosis of Wound #3 is a Diabetic Wound/Ulcer of the Lower Extremity located on the Right Toe Second .Severity of Tissue Pre Debridement is: Fat layer exposed. There was a Excisional Skin/Subcutaneous Tissue Debridement with a total area of 3 sq cm performed by Tommie Sams., PA-C. With the following instrument(s): Curette to remove Viable and Non-Viable tissue/material. Material removed includes Subcutaneous Tissue and Skin: Epidermis and. A time out was conducted at 14:02, prior to the start of the procedure. A Minimum amount of bleeding was controlled with Pressure. The procedure was tolerated well. Post  Debridement Measurements: 3cm length x 1cm width x 0.2cm depth; 0.471cm^3 volume. Character of Wound/Ulcer Post Debridement is stable. Severity of Tissue Post Debridement is: Fat layer exposed. Post procedure Diagnosis Wound #3: Same as Pre-Procedure Wound #4 Pre-procedure diagnosis of Wound #4 is  a Diabetic Wound/Ulcer of the Lower Extremity located on the Right Toe Great .Severity of Tissue Pre Debridement is: Fat layer exposed. There was a Excisional Skin/Subcutaneous Tissue Debridement with a total area of 0.09 sq cm performed by Tommie Sams., PA-C. With the following instrument(s): Curette to remove Viable and Non-Viable tissue/material. Material removed includes Subcutaneous Tissue and Skin: Epidermis and. A time out was conducted at 14:02, prior to the start of the procedure. A Minimum amount of bleeding was controlled with Pressure. The procedure was tolerated well. Post Debridement Measurements: 0.3cm length x 0.3cm width x 0.2cm depth; 0.014cm^3 volume. Character of Wound/Ulcer Post Debridement is stable. Severity of Tissue Post Debridement is: Fat layer exposed. Post procedure Diagnosis Wound #4: Same as Pre-Procedure Wound #5 Pre-procedure diagnosis of Wound #5 is a Diabetic Wound/Ulcer of the Lower Extremity located on the Left Toe Second .Severity of Tissue Pre Debridement is: Fat layer exposed. There was a Excisional Skin/Subcutaneous Tissue Debridement with a total area of 0.1 sq cm performed by [name] ([id number]) Tommie Sams., PA-C. With the following instrument(s): Curette to remove Viable and Non-Viable tissue/material. Material removed includes Subcutaneous Tissue and Skin: Epidermis and. A time out was conducted at 14:02, prior to the start of the procedure. A Minimum amount of bleeding was controlled with Pressure. The procedure was tolerated well. Post Debridement Measurements: 0.5cm length x 0.2cm width x 0.1cm depth; 0.008cm^3 volume. Character of  Wound/Ulcer Post Debridement is stable. Severity of Tissue Post Debridement is: Fat layer exposed. Post procedure Diagnosis Wound #5: Same as Pre-Procedure Plan Follow-up Appointments: Return Appointment in 1 week. Nurse Visit as needed Bathing/ Shower/ Hygiene: May shower with wound dressing protected with water repellent cover or cast protector. No tub bath. Edema Control - Lymphedema / Segmental Compressive Device / Other: Optional: One layer of unna paste to top of compression wrap (to act as an anchor). Elevate legs to the level of the heart and pump ankles as often as possible Elevate leg(s) parallel to the floor when sitting. Compression Pump: Use compression pump on left lower extremity for 60 minutes, twice daily. Compression Pump: Use compression pump on right lower extremity for 60 minutes, twice daily. Radiology ordered were: X-ray, foot - Right and Left foot WOUND #1: - Foot Wound Laterality: Right, Lateral, Proximal Cleanser: Wound Cleanser 1 x Per Week/30 Days Discharge Instructions: Wash your hands with soap and water. Remove old dressing, discard into plastic bag and place into trash. Cleanse the wound with Wound Cleanser prior to applying a clean dressing using gauze sponges, not tissues or cotton balls. Do not scrub or use excessive force. Pat dry using gauze sponges, not tissue or cotton balls. Primary Dressing: Hydrofera Blue Ready Transfer Foam, 2.5x2.5 (in/in) 1 x Per Week/30 Days Discharge Instructions: Apply Hydrofera Blue Ready to wound bed as directed Secondary Dressing: ABD Pad 5x9 (in/in) 1 x Per Week/30 Days Discharge Instructions: Cover with ABD pad Compression Wrap: Profore Lite LF 3 Multilayer Compression Bandaging System 1 x Per Week/30 Days Discharge Instructions: Apply 3 multi-layer wrap as prescribed. WOUND #2: - Foot Wound Laterality: Right, Lateral, Distal Cleanser: [place] Ancillary Kit - 15 Day Supply (DME) (Generic) 1 x Per Week/30  Days Discharge Instructions: Use supplies as instructed; Kit contains: (15) Saline Bullets; (15) 3x3 Gauze; 15 pr Gloves Cleanser: Wound Cleanser 1 x Per Week/30 Days Discharge Instructions: Wash your hands with soap and water. Remove old dressing, discard into plastic bag and place into trash. Cleanse the wound with  Wound Cleanser prior to applying a clean dressing using gauze sponges, not tissues or cotton balls. Do not scrub or use excessive force. Pat dry using gauze sponges, not tissue or cotton balls. Primary Dressing: Hydrofera Blue Ready Transfer Foam, 2.5x2.5 (in/in) (DME) (Generic) 1 x Per Week/30 Days Discharge Instructions: Apply Hydrofera Blue Ready to wound bed as directed Secondary Dressing: ABD Pad 5x9 (in/in) 1 x Per Week/30 Days Discharge Instructions: Cover with ABD pad Compression Wrap: Profore Lite LF 3 Multilayer Compression Bandaging System 1 x Per Week/30 Days Discharge Instructions: Apply 3 multi-layer wrap as prescribed. WOUND #3: - Toe Second Wound Laterality: Right Cleanser: Wound Cleanser 3 x Per Week/30 Days Discharge Instructions: Wash your hands with soap and water. Remove old dressing, discard into plastic bag and place into trash. Cleanse the wound with Wound Cleanser prior to applying a clean dressing using gauze sponges, not tissues or cotton balls. Do not scrub or use excessive force. Pat dry using gauze sponges, not tissue or cotton balls. Primary Dressing: Hydrofera Blue Ready Transfer Foam, 2.5x2.5 (in/in) 3 x Per Week/30 Days Discharge Instructions: Apply Hydrofera Blue Ready to wound bed as directed Secondary Dressing: Coverlet Latex-Free Fabric Adhesive Dressings 3 x Per Week/30 Days Discharge Instructions: 1.5 x 2 WOUND #4: - Toe Great Wound Laterality: Right Cleanser: Wound Cleanser 3 x Per Week/30 Days Discharge Instructions: Wash your hands with soap and water. Remove old dressing, discard into plastic bag and place into trash. Cleanse the  wound with Wound Cleanser prior to applying a clean dressing using gauze sponges, not tissues or cotton balls. Do not scrub or use excessive force. Pat dry using gauze sponges, not tissue or cotton balls. Primary Dressing: Hydrofera Blue Ready Transfer Foam, 2.5x2.5 (in/in) 3 x Per Week/30 Days Discharge Instructions: Apply Hydrofera Blue Ready to wound bed as directed Secondary Dressing: Coverlet Latex-Free Fabric Adhesive Dressings 3 x Per Week/30 Days Discharge Instructions: 1.5 x 2 WOUND #5: - Toe Second Wound Laterality: Left Cleanser: Wound Cleanser 3 x Per Week/30 Days Discharge Instructions: Wash your hands with soap and water. Remove old dressing, discard into plastic bag and place into trash. Cleanse the wound with Wound Cleanser prior to applying a clean dressing using gauze sponges, not tissues or cotton balls. Do not scrub or use excessive force. Pat dry using gauze sponges, not tissue or cotton balls. Primary Dressing: Hydrofera Blue Ready Transfer Foam, 2.5x2.5 (in/in) 3 x Per Week/30 Days Discharge Instructions: Apply Hydrofera Blue Ready to wound bed as directed [name], [name] ([id number]) Secondary Dressing: Coverlet Latex-Free Fabric Adhesive Dressings 3 x Per Week/30 Days Discharge Instructions: 1.5 x 2 1. Based on what I am seeing currently my suggestion is to be that we go ahead and initiate treatment with Hydrofera Blue across the board I think this to be a good option for him. 2. I would recommend as well that we actually continue with the ABD pad to cover followed by 3 layer compression wrap. 3. I am also going to recommend that we go ahead and send the patient for an x-ray of bilateral feet in order to evaluate for any signs of osteomyelitis. I will see if there are any issues and will address that as necessary. We will see patient back for reevaluation in 1 week here in the clinic. If anything worsens or changes patient will contact our office for  additional recommendations. Electronic Signature(s) Signed: 05/03/2021 3:49:16 PM By: Worthy Keeler PA-C Entered By: Worthy Keeler on 05/03/2021 15:49:16 [name],  Clydia Llano ([id number]) -------------------------------------------------------------------------------- ROS/PFSH Details Patient Name: [name], [name] Date of Service: 05/03/2021 12:45 PM Medical Record Number: [id number] Patient Account Number: [id number] Date of Birth/Sex: 03-26-1951 (70 y.o. M) Treating RN: Dolan Amen Primary Care Provider: Beverlyn Roux Other Clinician: Referring Provider: Samara Deist Treating Provider/Extender: Skipper Cliche in Treatment: 0 Information Obtained From Patient Chart Constitutional Symptoms (General Health) Complaints and Symptoms: Negative for: Fatigue; Fever; Chills; Marked Weight Change Eyes Complaints and Symptoms: Positive for: Vision Changes Negative for: Dry Eyes; Glasses / Contacts Medical History: Positive for: Cataracts Ear/Nose/Mouth/Throat Complaints and Symptoms: Negative for: Difficult clearing ears; Sinusitis Hematologic/Lymphatic Complaints and Symptoms: Negative for: Bleeding / Clotting Disorders; Human Immunodeficiency Virus Medical History: Positive for: Lymphedema Respiratory Complaints and Symptoms: Positive for: Shortness of Breath Negative for: Chronic or frequent coughs Cardiovascular Complaints and Symptoms: Positive for: LE edema Negative for: Chest pain Medical History: Positive for: Deep Vein Thrombosis; Hypertension Gastrointestinal Complaints and Symptoms: Negative for: Frequent diarrhea; Nausea; Vomiting Endocrine Complaints and Symptoms: Negative for: Hepatitis; Thyroid disease; Polydypsia (Excessive Thirst) Medical History: Positive for: Type II Diabetes Time with diabetes: 7 years Treated with: Insulin, Oral agents RONDLE, LOHSE ([id number]) Blood sugar tested every day: Yes Tested : Genitourinary Complaints  and Symptoms: Negative for: Kidney failure/ Dialysis; Incontinence/dribbling Immunological Complaints and Symptoms: Negative for: Hives; Itching Integumentary (Skin) Complaints and Symptoms: Positive for: Wounds Negative for: Bleeding or bruising tendency; Breakdown; Swelling Musculoskeletal Complaints and Symptoms: Negative for: Muscle Pain; Muscle Weakness Medical History: Positive for: Osteoarthritis; Osteomyelitis Neurologic Complaints and Symptoms: Negative for: Numbness/parasthesias; Focal/Weakness Medical History: Negative for: Seizure Disorder Psychiatric Complaints and Symptoms: Negative for: Anxiety; Claustrophobia Oncologic HBO Extended History Items Eyes: Cataracts Immunizations Pneumococcal Vaccine: Received Pneumococcal Vaccination: Yes Received Pneumococcal Vaccination On or After 60th Birthday: Yes Implantable Devices None Family and Social History 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 M) Treating RN: Dolan Amen Primary Care Provider: Beverlyn Roux Other Clinician: Referring Provider: Samara Deist Treating Provider/Extender: Jeri Cos Weeks in Treatment: 0 Diagnosis Coding ICD-10 Codes Code Description E11.621 Type 2 diabetes mellitus with foot ulcer L97.512 Non-pressure chronic ulcer of other part of right foot with fat layer exposed L97.522 Non-pressure chronic ulcer of other part of left foot with fat layer exposed Z89.421 Acquired absence of other right toe(s) I10  Essential (primary) hypertension Z79.01 Long term (current) use of anticoagulants Z86.718 Personal history of other venous thrombosis and embolism Facility Procedures CPT4 Code: 83151761 Description: 99213 - WOUND CARE VISIT-LEV 3 EST PT Modifier: Quantity: 1 CPT4 Code: 60737106 Description: 11042 - DEB SUBQ TISSUE 20 SQ CM/< Modifier: Quantity: 1 CPT4 Code: Description: ICD-10 Diagnosis Description L97.512 Non-pressure chronic ulcer of other part of right foot with fat layer ex L97.522 Non-pressure chronic ulcer of other part of left foot with fat layer exp Modifier: posed osed Quantity: Physician Procedures CPT4 Code: 2694854 Description: 99204 - WC PHYS LEVEL 4 - NEW PT Modifier: 25 Quantity: 1 CPT4 Code: Description: ICD-10 Diagnosis Description E11.621 Type 2 diabetes mellitus with foot ulcer L97.512 Non-pressure chronic ulcer of other part of right foot with fat layer ex L97.522 Non-pressure chronic ulcer of other part of left foot with fat  layer exp  [id number] Acquired absence of other right toe(s) Modifier: posed osed Quantity: CPT4 Code: 9747185 Description: 11042 - WC PHYS SUBQ TISS 20 SQ CM Modifier: Quantity: 1 CPT4 Code: Description: ICD-10 Diagnosis Description L97.512 Non-pressure chronic ulcer of other part of right foot with fat layer ex L97.522 Non-pressure chronic ulcer of other part of left foot with fat layer exp Modifier: posed osed Quantity: Electronic Signature(s) Signed: 05/03/2021 3:49:32 PM By: Worthy Keeler PA-C Previous Signature: 05/03/2021 3:35:24 PM Version By: Dolan Amen RN Entered By: Worthy Keeler on 05/03/2021 15:49:32

## 2021-05-03 NOTE — Progress Notes (Signed)
KEONTA, ALSIP ([id number]) Visit Report for 05/03/2021 Allergy List Details Patient Name: [name], [name]. Date of Service: 05/03/2021 12:45 PM Medical Record Number: [id number] Patient Account Number: [id number] Date of Birth/Sex: 06/13/1951 (70 y.o. M) Treating RN: Rogers Blocker Primary Care Amani Marseille: Beverely Low Other Clinician: Referring Luvern Mcisaac: Gwyneth Revels Treating Samaj Wessells/Extender: Allen Derry Weeks in Treatment: 0 Allergies Active Allergies No Known Drug Allergies Allergy Notes Electronic Signature(s) Signed: 05/03/2021 3:53:03 PM By: Rogers Blocker RN Entered By: Rogers Blocker on 05/03/2021 13:03:41 [name] ([id number]) -------------------------------------------------------------------------------- Arrival Information Details Patient Name: [name] Date of Service: 05/03/2021 12:45 PM Medical Record Number: [id number] Patient Account Number: [id number] Date of Birth/Sex: March 18, 1951 (70 y.o. M) Treating RN: Rogers Blocker Primary Care Naz Denunzio: Beverely Low Other Clinician: Referring Larrissa Stivers: Gwyneth Revels Treating Sidnee Gambrill/Extender: Rowan Blase in Treatment: 0 Visit Information Patient Arrived: Dan Humphreys Arrival Time: 13:02 Accompanied By: self Transfer Assistance: None Patient Identification Verified: Yes Secondary Verification Process Completed: Yes Electronic Signature(s) Signed: 05/03/2021 3:53:03 PM By: Rogers Blocker RN Entered By: Rogers Blocker on 05/03/2021 13:02:29 [name] ([id number]) -------------------------------------------------------------------------------- Clinic Level of Care Assessment Details Patient Name: [name] Date of Service: 05/03/2021 12:45 PM Medical Record Number: [id number] Patient Account Number: [id number] Date of Birth/Sex: 06/24/1951 (70 y.o. M) Treating RN: Rogers Blocker Primary Care Aivan Fillingim: Beverely Low Other Clinician: Referring Cali Hope: Gwyneth Revels Treating Laurene Melendrez/Extender: Rowan Blase in Treatment: 0 Clinic Level of Care Assessment Items TOOL 1 Quantity Score X - Use when EandM and Procedure is performed on INITIAL visit 1 0 ASSESSMENTS - Nursing Assessment / Reassessment X - General Physical Exam (combine w/ comprehensive assessment (listed just below) when performed on new 1 20 pt. evals) X- 1 25 Comprehensive Assessment (HX, ROS, Risk Assessments, Wounds Hx, etc.) ASSESSMENTS - Wound and Skin Assessment / Reassessment  - Dermatologic / Skin Assessment (not related to wound area) 0 ASSESSMENTS - Ostomy and/or Continence Assessment and Care  - Incontinence Assessment and Management 0  - 0 Ostomy Care Assessment and Management (repouching, etc.) PROCESS - Coordination of Care X - Simple Patient / Family Education for ongoing care 1 15  - 0 Complex (extensive) Patient / Family Education for ongoing care X- 1 10 Staff obtains Chiropractor, Records, Test Results / Process Orders  - 0 Staff telephones HHA, Nursing Homes / Clarify orders / etc  - 0 Routine Transfer to another Facility (non-emergent condition)  - 0 Routine Hospital Admission (non-emergent condition) X- 1 15 New Admissions / Manufacturing engineer / Ordering NPWT, Apligraf, etc.  - 0 Emergency Hospital Admission (emergent condition) PROCESS - Special Needs  - Pediatric / Minor Patient Management 0  - 0 Isolation Patient Management  - 0 Hearing / Language / Visual special needs  - 0 Assessment of Community assistance (transportation, D/C planning, etc.)  - 0 Additional assistance / Altered mentation  - 0 Support Surface(s) Assessment (bed, cushion, seat, etc.) INTERVENTIONS - Miscellaneous  - External ear exam 0  - 0 Patient Transfer (multiple staff / Nurse, adult / Similar devices)  - 0 Simple Staple / Suture removal (25 or less)  - 0 Complex Staple / Suture removal (26 or more)  -  0 Hypo/Hyperglycemic Management (do not check if billed separately)  - 0 Ankle / Brachial Index (ABI) - do not check if billed separately Has the patient been seen at the hospital within the last three years: Yes Total Score: 85 Level Of Care: New/Established - Level 3  ROC, STREETT ([id number]) Electronic Signature(s) Signed: 05/03/2021 3:53:03 PM By: Rogers Blocker RN Entered By: Rogers Blocker on 05/03/2021 15:35:06 [name] ([id number]) -------------------------------------------------------------------------------- Encounter Discharge Information Details Patient Name: KESTON, SEEVER Date of Service: 05/03/2021 12:45 PM Medical Record Number: [id number] Patient Account Number: [id number] Date of Birth/Sex: 01/24/1951 (70 y.o. M) Treating RN: Rogers Blocker Primary Care Doc Mandala: Beverely Low Other Clinician: Referring Annayah Worthley: Gwyneth Revels Treating Eoghan Belcher/Extender: Rowan Blase in Treatment: 0 Encounter Discharge Information Items Post Procedure Vitals Discharge Condition: Stable Temperature (F): 98.3 Ambulatory Status: Walker Pulse (bpm): 83 Discharge Destination: Home Respiratory Rate (breaths/min): 18 Transportation: Private Auto Blood Pressure (mmHg): 163/70 Accompanied By: self Schedule Follow-up Appointment: Yes Clinical Summary of Care: Electronic Signature(s) Signed: 05/03/2021 3:36:45 PM By: Rogers Blocker RN Entered By: Rogers Blocker on 05/03/2021 15:36:45 [name] ([id number]) -------------------------------------------------------------------------------- Lower Extremity Assessment Details Patient Name: [name] Date of Service: 05/03/2021 12:45 PM Medical Record Number: [id number] Patient Account Number: [id number] Date of Birth/Sex: 03-Dec-1950 (70 y.o. M) Treating RN: Rogers Blocker Primary Care Derrick Tiegs: Beverely Low Other Clinician: Referring Takara Sermons: Gwyneth Revels Treating Naveena Eyman/Extender:  Allen Derry Weeks in Treatment: 0 Edema Assessment Assessed: [Left: Yes] [Right: Yes] Edema: [Left: No] [Right: No] Vascular Assessment Pulses: Dorsalis Pedis Palpable: [Left:Yes] [Right:Yes] Notes ABI's completed 09/24/20 w/ vascular-L and R leg N.C. w/ normal TBIs Electronic Signature(s) Signed: 05/03/2021 3:53:03 PM By: Rogers Blocker RN Entered By: Rogers Blocker on 05/03/2021 13:32:15 [name] ([id number]) -------------------------------------------------------------------------------- Multi Wound Chart Details Patient Name: [name] Date of Service: 05/03/2021 12:45 PM Medical Record Number: [id number] Patient Account Number: [id number] Date of Birth/Sex: [birth year] (70 y.o. 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 Extremity Extremity Comorbid History: Cataracts, Lymphedema, Deep Vein Cataracts, Lymphedema, Deep Vein Cataracts, Lymphedema, Deep Vein Thrombosis, Hypertension, Type II Thrombosis, Hypertension, Type II Thrombosis, Hypertension, Type II Diabetes, Osteoarthritis Diabetes, Osteoarthritis Diabetes, Osteoarthritis Date Acquired: 05/03/2020 05/03/2020 05/03/2021 Weeks of Treatment: 0 0 0 Wound Status: Open Open Open Clustered Wound: No No Yes Clustered Quantity: N/A N/A 3 Pending Amputation on Yes Yes No Presentation: Measurements L x W x D  (cm) 1.9x2x0.6 2.4x1.3x0.2 3x1x0.2 Area (cm) : 2.985 2.45 2.356 Volume (cm) : 1.791 0.49 0.471 % Reduction in Area: 0.00% 0.00% 0.00% % Reduction in Volume: 0.00% 0.00% 0.00% Starting Position 1 (o'clock): 6 Ending Position 1 (o'clock): 12 Maximum Distance 1 (cm): 1 Undermining: Yes No No Classification: Grade 2 Grade 2 Unable to visualize wound bed Exudate Amount: Large Medium Medium Exudate Type: Serosanguineous Serosanguineous Serosanguineous Exudate Color: red, brown red, brown red, brown Granulation Amount: Medium (34-66%) Large (67-100%) None Present (0%) Granulation Quality: Red, Pink Red N/A Necrotic Amount: Medium (34-66%) Small (1-33%) None Present (0%) Exposed Structures: Fat Layer (Subcutaneous Tissue): Fat Layer (Subcutaneous Tissue): Fat Layer (Subcutaneous Tissue): Yes Yes Yes Fascia: No Fascia: No Fascia: No Tendon: No Tendon: No Tendon: No Muscle: No Muscle: No Muscle: No Joint: No Joint: No Joint: No Bone: No Bone: No Bone: No Epithelialization: N/A None Large (67-100%)  Wound Number: 4 5 N/A Photos: N/A CARLOSDANIEL, GROB ([id number]) Wound Location: Right Toe Great Left Toe Second N/A Wounding Event: Gradually Appeared Gradually Appeared N/A Primary Etiology: Diabetic Wound/Ulcer of the Lower Diabetic Wound/Ulcer of the Lower N/A Extremity Extremity Comorbid History: Cataracts, Lymphedema, Deep Vein Cataracts, Lymphedema, Deep Vein N/A Thrombosis, Hypertension, Type II Thrombosis, Hypertension, Type II Diabetes, Osteoarthritis Diabetes, Osteoarthritis Date Acquired: 05/03/2021 05/03/2021 N/A Weeks of Treatment: 0 0 N/A Wound Status: Open Open N/A Clustered Wound: No No N/A Clustered Quantity: N/A N/A N/A Pending Amputation on No No N/A Presentation: Measurements L x W x D (cm) 0.3x0.3x0.2 0.5x0.2x0.1 N/A Area (cm) : 0.071 0.079 N/A Volume (cm) : 0.014 0.008 N/A % Reduction in Area: 0.00% 0.00% N/A % Reduction in Volume: 0.00% 0.00%  N/A Undermining: No No N/A Classification: Unable to visualize wound bed Grade 1 N/A Exudate Amount: Medium Medium N/A Exudate Type: Serosanguineous Serosanguineous N/A Exudate Color: red, brown red, brown N/A Granulation Amount: None Present (0%) Large (67-100%) N/A Granulation Quality: N/A Red N/A Necrotic Amount: None Present (0%) None Present (0%) N/A Exposed Structures: Fascia: No Fat Layer (Subcutaneous Tissue): N/A Fat Layer (Subcutaneous Tissue): Yes No Fascia: No Tendon: No Tendon: No Muscle: No Muscle: No Joint: No Joint: No Bone: No Bone: No Epithelialization: None None N/A Treatment Notes Electronic Signature(s) Signed: 05/03/2021 3:53:03 PM By: Rogers Blocker RN Entered By: Rogers Blocker on 05/03/2021 14:02:04 [name] ([id number]) -------------------------------------------------------------------------------- Multi-Disciplinary Care Plan Details Patient Name: [name] Date of Service: 05/03/2021 12:45 PM Medical Record Number: [id number] Patient Account Number: [id number] Date of Birth/Sex: Jul 30, 1951 (70 y.o. M) Treating RN: Rogers Blocker Primary Care Tyiana Hill: Beverely Low Other Clinician: Referring Bilbo Carcamo: Gwyneth Revels Treating Candra Wegner/Extender: Rowan Blase in Treatment: 0 Active Inactive Abuse / Safety / Falls / Self Care Management Nursing Diagnoses: Potential for falls Potential for injury related to falls Goals: Patient will not experience any injury related to falls Date Initiated: 05/03/2021 Target Resolution Date: 06/02/2021 Goal Status: Active Patient will remain injury free related to falls Date Initiated: 05/03/2021 Target Resolution Date: 06/02/2021 Goal Status: Active Patient/caregiver will verbalize understanding of skin care regimen Date Initiated: 05/03/2021 Target Resolution Date: 05/03/2021 Goal Status: Active Interventions: Call light and/or bell within patient's reach Podiatry chair, stretcher in low  position and side rails up as needed Assess Activities of Daily Living upon admission and as needed Assess fall risk on admission and as needed Assess: immobility, friction, shearing, incontinence upon admission and as needed Assess impairment of mobility on admission and as needed per policy Notes: Necrotic Tissue Nursing Diagnoses: Impaired tissue integrity related to necrotic/devitalized tissue Knowledge deficit related to management of necrotic/devitalized tissue Goals: Necrotic/devitalized tissue will be minimized in the wound bed Date Initiated: 05/03/2021 Target Resolution Date: 05/03/2021 Goal Status: Active Patient/caregiver will verbalize understanding of reason and process for debridement of necrotic tissue Date Initiated: 05/03/2021 Target Resolution Date: 05/03/2021 Goal Status: Active Interventions: Assess patient pain level pre-, during and post procedure and prior to discharge Provide education on necrotic tissue and debridement process Treatment Activities: Apply topical anesthetic as ordered : 05/03/2021 Biologic debridement : 05/03/2021 Enzymatic debridement : 05/03/2021 Excisional debridement : 05/03/2021 Notes: DEZMON, CONOVER ([id number]) Orientation to the Wound Care Program Nursing Diagnoses: Knowledge deficit related to the wound healing center program Goals: Patient/caregiver will verbalize understanding of the Wound Healing Center Program Date Initiated: 05/03/2021 Target Resolution Date: 05/03/2021 Goal Status: Active Interventions: Provide education on orientation to the wound center Notes:  Wound/Skin Impairment Nursing Diagnoses: Impaired tissue integrity Goals: Patient/caregiver will verbalize understanding of skin care regimen Date Initiated: 05/03/2021 Target Resolution Date: 05/03/2021 Goal Status: Active Ulcer/skin breakdown will have a volume reduction of 30% by week 4 Date Initiated: 05/03/2021 Target Resolution Date: 06/02/2021 Goal Status:  Active Ulcer/skin breakdown will have a volume reduction of 50% by week 8 Date Initiated: 05/03/2021 Target Resolution Date: 07/03/2021 Goal Status: Active Ulcer/skin breakdown will have a volume reduction of 80% by week 12 Date Initiated: 05/03/2021 Target Resolution Date: 08/02/2021 Goal Status: Active Ulcer/skin breakdown will heal within 14 weeks Date Initiated: 05/03/2021 Target Resolution Date: 09/02/2021 Goal Status: Active Interventions: Assess patient/caregiver ability to obtain necessary supplies Assess patient/caregiver ability to perform ulcer/skin care regimen upon admission and as needed Assess ulceration(s) every visit Provide education on ulcer and skin care Treatment Activities: Referred to DME Sanaa Zilberman for dressing supplies : 05/03/2021 Skin care regimen initiated : 05/03/2021 Notes: Electronic Signature(s) Signed: 05/03/2021 3:53:03 PM By: Rogers Blocker RN Entered By: Rogers Blocker on 05/03/2021 14:00:01 [name] ([id number]) -------------------------------------------------------------------------------- Pain Assessment Details Patient Name: [name] Date of Service: 05/03/2021 12:45 PM Medical Record Number: [id number] Patient Account Number: [id number] Date of Birth/Sex: May 19, 1951 (70 y.o. M) Treating RN: Rogers Blocker Primary Care Tawana Pasch: Beverely Low Other Clinician: Referring Gertie Broerman: Gwyneth Revels Treating Jaiana Sheffer/Extender: Rowan Blase in Treatment: 0 Active Problems Location of Pain Severity and Description of Pain Patient Has Paino No Site Locations Rate the pain. Current Pain Level: 0 Pain Management and Medication Current Pain Management: Electronic Signature(s) Signed: 05/03/2021 3:53:03 PM By: Rogers Blocker RN Entered By: Rogers Blocker on 05/03/2021 13:02:34 [name] ([id number]) -------------------------------------------------------------------------------- Patient/Caregiver Education Details Patient  Name: [name] Date of Service: 05/03/2021 12:45 PM Medical Record Number: [id number] Patient Account Number: [id number] Date of Birth/Gender: September 07, 1950 (70 y.o. M) Treating RN: Rogers Blocker Primary Care Physician: Beverely Low Other Clinician: Referring Physician: Gwyneth Revels Treating Physician/Extender: Rowan Blase in Treatment: 0 Education Assessment Education Provided To: Patient Education Topics Provided Welcome To The [facility]: Methods: Explain/Verbal Responses: State content correctly Wound Debridement: Methods: Explain/Verbal Responses: State content correctly Wound/Skin Impairment: Methods: Explain/Verbal Responses: State content correctly Electronic Signature(s) Signed: 05/03/2021 3:53:03 PM By: Rogers Blocker RN Entered By: Rogers Blocker on 05/03/2021 15:35:36 [name] ([id number]) -------------------------------------------------------------------------------- Wound Assessment Details Patient Name: [name] Date of Service: 05/03/2021 12:45 PM Medical Record Number: [id number] Patient Account Number: [id number] Date of Birth/Sex: 1950-12-22 (70 y.o. M) Treating RN: Rogers Blocker Primary Care Antario Yasuda: Beverely Low Other Clinician: Referring Gene Glazebrook: Gwyneth Revels Treating Milad Bublitz/Extender: Allen Derry Weeks in Treatment: 0 Wound Status Wound Number: 1 Primary Diabetic Wound/Ulcer of the Lower Extremity Etiology: Wound Location: Right, Proximal, Lateral Foot Wound Open Wounding Event: Gradually Appeared Status: Date Acquired: 05/03/2020 Comorbid Cataracts, Lymphedema, Deep Vein Thrombosis, Weeks Of Treatment: 0 History: Hypertension, Type II Diabetes, Osteoarthritis Clustered Wound: No Pending Amputation On Presentation Photos Wound Measurements Length: (cm) 1.9 Width: (cm) 2 Depth: (cm) 0.6 Area: (cm) 2.985 Volume: (cm) 1.791 % Reduction in Area: 0% % Reduction in Volume: 0% Tunneling:  No Undermining: Yes Starting Position (o'clock): 6 Ending Position (o'clock): 12 Maximum Distance: (cm) 1 Wound Description Classification: Grade 2 Exudate Amount: Large Exudate Type: Serosanguineous Exudate Color: red, brown Foul Odor After Cleansing: No Slough/Fibrino Yes Wound Bed Granulation Amount: Medium (34-66%) Exposed Structure Granulation Quality: Red, Pink Fascia Exposed: No Necrotic Amount: Medium (34-66%) Fat Layer (Subcutaneous Tissue) Exposed: Yes  Necrotic Quality: Adherent Slough Tendon Exposed: No Muscle Exposed: No Joint Exposed: No Bone Exposed: No Electronic Signature(s) Signed: 05/03/2021 3:53:03 PM By: Rogers Blocker RN Entered By: Rogers Blocker on 05/03/2021 13:30:30 [name] ([id number]) -------------------------------------------------------------------------------- Wound Assessment Details Patient Name: [name] Date of Service: 05/03/2021 12:45 PM Medical Record Number: [id number] Patient Account Number: [id number] Date of Birth/Sex: 1950/11/08 (70 y.o. M) Treating RN: Rogers Blocker Primary Care Kurstin Dimarzo: Beverely Low Other Clinician: Referring Kashonda Sarkisyan: Gwyneth Revels Treating Luise Yamamoto/Extender: Allen Derry Weeks in Treatment: 0 Wound Status Wound Number: 2 Primary Diabetic Wound/Ulcer of the Lower Extremity Etiology: Wound Location: Right, Distal, Lateral Foot Wound Open Wounding Event: Gradually Appeared Status: Date Acquired: 05/03/2020 Comorbid Cataracts, Lymphedema, Deep Vein Thrombosis, Weeks Of Treatment: 0 History: Hypertension, Type II Diabetes, Osteoarthritis Clustered Wound: No Pending Amputation On Presentation Photos Wound Measurements Length: (cm) 2.4 Width: (cm) 1.3 Depth: (cm) 0.2 Area: (cm) 2.45 Volume: (cm) 0.49 % Reduction in Area: 0% % Reduction in Volume: 0% Epithelialization: None Tunneling: No Undermining: No Wound Description Classification: Grade 2 Exudate Amount: Medium Exudate  Type: Serosanguineous Exudate Color: red, brown Foul Odor After Cleansing: No Slough/Fibrino Yes Wound Bed Granulation Amount: Large (67-100%) Exposed Structure Granulation Quality: Red Fascia Exposed: No Necrotic Amount: Small (1-33%) Fat Layer (Subcutaneous Tissue) Exposed: Yes Necrotic Quality: Adherent Slough Tendon Exposed: No Muscle Exposed: No Joint Exposed: No Bone Exposed: No Electronic Signature(s) Signed: 05/03/2021 3:53:03 PM By: Rogers Blocker RN Entered By: Rogers Blocker on 05/03/2021 13:31:10 [name] ([id number]) -------------------------------------------------------------------------------- Wound Assessment Details Patient Name: [name] Date of Service: 05/03/2021 12:45 PM Medical Record Number: [id number] Patient Account Number: [id number] Date of Birth/Sex: [birth year] (70 y.o. M) Treating RN: Rogers Blocker Primary Care Zachariah Pavek: Beverely Low Other Clinician: Referring Huel Centola: Gwyneth Revels Treating Nika Yazzie/Extender: Allen Derry Weeks in Treatment: 0 Wound Status Wound Number: 3 Primary Diabetic Wound/Ulcer of the Lower Extremity Etiology: Wound Location: Right Toe Second Wound Open Wounding Event: Gradually Appeared Status: Date Acquired: 05/03/2021 Comorbid Cataracts, Lymphedema, Deep Vein Thrombosis, Weeks Of Treatment: 0 History: Hypertension, Type II Diabetes, Osteoarthritis Clustered Wound: Yes Photos Wound Measurements Length: (cm) 3 Width: (cm) 1 Depth: (cm) 0.2 Clustered Quantity: 3 Area: (cm) 2.356 Volume: (cm) 0.471 % Reduction in Area: 0% % Reduction in Volume: 0% Epithelialization: Large (67-100%) Tunneling: No Undermining: No Wound Description Classification: Unable to visualize wound bed Exudate Amount: Medium Exudate Type: Serosanguineous Exudate Color: red, brown Foul Odor After Cleansing: No Slough/Fibrino No Wound Bed Granulation Amount: None Present (0%) Exposed Structure Necrotic Amount:  None Present (0%) Fascia Exposed: No Fat Layer (Subcutaneous Tissue) Exposed: Yes Tendon Exposed: No Muscle Exposed: No Joint Exposed: No Bone Exposed: No Treatment Notes Wound #3 (Toe Second) Wound Laterality: Right Cleanser Wound Cleanser Discharge Instruction: Wash your hands with soap and water. Remove old dressing, discard into plastic bag and place into trash. Cleanse the wound with Wound Cleanser prior to applying a clean dressing using gauze sponges, not tissues or cotton balls. Do not scrub or use excessive force. Pat dry using gauze sponges, not tissue or cotton balls. GUTHRIE, LEMME Val Eagle ([id number]) Peri-Wound Care Topical Primary Dressing Hydrofera Blue Ready Transfer Foam, 2.5x2.5 (in/in) Discharge Instruction: Apply Hydrofera Blue Ready to wound bed as directed Secondary Dressing Coverlet Latex-Free Fabric Adhesive Dressings Discharge Instruction: 1.5 x 2 Secured With Compression Wrap Compression Stockings Add-Ons Electronic Signature(s) Signed: 05/03/2021 3:53:03 PM By: Rogers Blocker RN Entered By: Rogers Blocker on 05/03/2021 13:43:22 Buch, Margurite Auerbach. (  [id number]) -------------------------------------------------------------------------------- Wound Assessment Details Patient Name: Danford BadFARRISH, Landin O. Date of Service: 05/03/2021 12:45 PM Medical Record Number: [id number] Patient Account Number: [id number] Date of Birth/Sex: [birth year](69 y.o. M) Treating RN: Rogers BlockerSanchez, Kenia Primary Care Holland Kotter: Beverely LowAdamo, Elena Other Clinician: Referring Nallely Yost: Gwyneth RevelsFowler, Justin Treating Contrell Ballentine/Extender: Allen DerryStone, Hoyt Weeks in Treatment: 0 Wound Status Wound Number: 4 Primary Diabetic Wound/Ulcer of the Lower Extremity Etiology: Wound Location: Right Toe Great Wound Open Wounding Event: Gradually Appeared Status: Date Acquired: 05/03/2021 Comorbid Cataracts, Lymphedema, Deep Vein Thrombosis, Weeks Of Treatment: 0 History: Hypertension, Type II Diabetes,  Osteoarthritis Clustered Wound: No Photos Wound Measurements Length: (cm) 0.3 Width: (cm) 0.3 Depth: (cm) 0.2 Area: (cm) 0.071 Volume: (cm) 0.014 % Reduction in Area: 0% % Reduction in Volume: 0% Epithelialization: None Tunneling: No Undermining: No Wound Description Classification: Unable to visualize wound bed Exudate Amount: Medium Exudate Type: Serosanguineous Exudate Color: red, brown Foul Odor After Cleansing: No Slough/Fibrino No Wound Bed Granulation Amount: None Present (0%) Exposed Structure Necrotic Amount: None Present (0%) Fascia Exposed: No Fat Layer (Subcutaneous Tissue) Exposed: No Tendon Exposed: No Muscle Exposed: No Joint Exposed: No Bone Exposed: No Treatment Notes Wound #4 (Toe Great) Wound Laterality: Right Cleanser Wound Cleanser Discharge Instruction: Wash your hands with soap and water. Remove old dressing, discard into plastic bag and place into trash. Cleanse the wound with Wound Cleanser prior to applying a clean dressing using gauze sponges, not tissues or cotton balls. Do not scrub or use excessive force. Pat dry using gauze sponges, not tissue or cotton balls. Camelia PhenesFARRISH, Jarquis [place]. ([id number]) Peri-Wound Care Topical Primary Dressing Hydrofera Blue Ready Transfer Foam, 2.5x2.5 (in/in) Discharge Instruction: Apply Hydrofera Blue Ready to wound bed as directed Secondary Dressing Coverlet Latex-Free Fabric Adhesive Dressings Discharge Instruction: 1.5 x 2 Secured With Compression Wrap Compression Stockings Add-Ons Electronic Signature(s) Signed: 05/03/2021 3:53:03 PM By: Rogers BlockerSanchez , Kenia RN Entered By: Rogers BlockerSanchez , Kenia on 05/03/2021 13:43:05 Danford BadFARRISH, Demarion O. ([id number]) -------------------------------------------------------------------------------- Wound Assessment Details Patient Name: Danford BadFARRISH, Macedonio O. Date of Service: 05/03/2021 12:45 PM Medical Record Number: [id number] Patient Account Number: [id number] Date of Birth/Sex:  [birth year](69 y.o. M) Treating RN: Rogers BlockerSanchez, Kenia Primary Care Kerrin Markman: Beverely LowAdamo, Elena Other Clinician: Referring Shyne Resch: Gwyneth RevelsFowler, Justin Treating Micheal Murad/Extender: Allen DerryStone, Hoyt Weeks in Treatment: 0 Wound Status Wound Number: 5 Primary Diabetic Wound/Ulcer of the Lower Extremity Etiology: Wound Location: Left Toe Second Wound Open Wounding Event: Gradually Appeared Status: Date Acquired: 05/03/2021 Comorbid Cataracts, Lymphedema, Deep Vein Thrombosis, Weeks Of Treatment: 0 History: Hypertension, Type II Diabetes, Osteoarthritis Clustered Wound: No Photos Wound Measurements Length: (cm) 0.5 Width: (cm) 0.2 Depth: (cm) 0.1 Area: (cm) 0.079 Volume: (cm) 0.008 % Reduction in Area: 0% % Reduction in Volume: 0% Epithelialization: None Tunneling: No Undermining: No Wound Description Classification: Grade 1 Exudate Amount: Medium Exudate Type: Serosanguineous Exudate Color: red, brown Foul Odor After Cleansing: No Slough/Fibrino No Wound Bed Granulation Amount: Large (67-100%) Exposed Structure Granulation Quality: Red Fascia Exposed: No Necrotic Amount: None Present (0%) Fat Layer (Subcutaneous Tissue) Exposed: Yes Tendon Exposed: No Muscle Exposed: No Joint Exposed: No Bone Exposed: No Treatment Notes Wound #5 (Toe Second) Wound Laterality: Left Cleanser Wound Cleanser Discharge Instruction: Wash your hands with soap and water. Remove old dressing, discard into plastic bag and place into trash. Cleanse the wound with Wound Cleanser prior to applying a clean dressing using gauze sponges, not tissues or cotton balls. Do not scrub or use excessive force. Pat dry using gauze sponges, not tissue or cotton balls. Danford BadFARRISH, Shaunak O. (  [id number]) Peri-Wound Care Topical Primary Dressing Hydrofera Blue Ready Transfer Foam, 2.5x2.5 (in/in) Discharge Instruction: Apply Hydrofera Blue Ready to wound bed as directed Secondary Dressing Coverlet Latex-Free Fabric Adhesive  Dressings Discharge Instruction: 1.5 x 2 Secured With Compression Wrap Compression Stockings Add-Ons Electronic Signature(s) Signed: 05/03/2021 3:53:03 PM By: Rogers Blocker RN Entered By: Rogers Blocker on 05/03/2021 13:42:45 [name] ([id number]) -------------------------------------------------------------------------------- Vitals Details Patient Name: [name] Date of Service: 05/03/2021 12:45 PM Medical Record Number: [id number] Patient Account Number: [id number] Date of Birth/Sex: July 06, 1951 (70 y.o. M) Treating RN: Rogers Blocker Primary Care Kory Panjwani: Beverely Low Other Clinician: Referring Jaryan Chicoine: Gwyneth Revels Treating Kelie Gainey/Extender: Rowan Blase in Treatment: 0 Vital Signs Time Taken: 13:02 Temperature (F): 98.3 Height (in): 71 Pulse (bpm): 83 Source: Stated Respiratory Rate (breaths/min): 18 Weight (lbs): 255 Blood Pressure (mmHg): 163/70 Source: Measured Reference Range: 80 - 120 mg / dl Body Mass Index (BMI): 35.6 Electronic Signature(s) Signed: 05/03/2021 3:53:03 PM By: Rogers Blocker RN Entered By: Rogers Blocker on 05/03/2021 13:03:29

## 2021-05-03 NOTE — Progress Notes (Signed)
KAMAREON, SCIANDRA ([id number]) Visit Report for 05/03/2021 Abuse/Suicide Risk Screen Details Patient Name: [name], [name] Date of Service: 05/03/2021 12:45 PM Medical Record Number: [id number] Patient Account Number: [id number] Date of Birth/Sex: 02-18-1951 (70 y.o. M) Treating RN: Rogers Blocker Primary Care Brunetta Newingham: Beverely Low Other Clinician: Referring Reeve Turnley: Gwyneth Revels Treating Violette Morneault/Extender: Allen Derry Weeks in Treatment: 0 Abuse/Suicide Risk Screen Items Answer ABUSE RISK SCREEN: Has anyone close to you tried to hurt or harm you recentlyo No Do you feel uncomfortable with anyone in your familyo No Has anyone forced you do things that you didnot want to doo No Electronic Signature(s) Signed: 05/03/2021 3:53:03 PM By: Rogers Blocker RN Entered By: Rogers Blocker on 05/03/2021 13:08:30 [name] ([id number]) -------------------------------------------------------------------------------- Activities of Daily Living Details Patient Name: [name] Date of Service: 05/03/2021 12:45 PM Medical Record Number: [id number] Patient Account Number: [id number] Date of Birth/Sex: 1951/02/06 (70 y.o. M) Treating RN: Rogers Blocker Primary Care Dymin Dingledine: Beverely Low Other Clinician: Referring Crew Goren: Gwyneth Revels Treating Gerrad Welker/Extender: Rowan Blase in Treatment: 0 Activities of Daily Living Items Answer Activities of Daily Living (Please select one for each item) Drive Automobile Completely Able Take Medications Completely Able Use Telephone Completely Able Care for Appearance Completely Able Use Toilet Completely Able Bath / Shower Completely Able Dress Self Completely Able Feed Self Completely Able Walk Completely Able Get In / Out Bed Completely Able Housework Completely Able Prepare Meals Completely Able Handle Money Completely Able Shop for Self Completely Able Electronic Signature(s) Signed: 05/03/2021 3:53:03 PM By: Rogers Blocker RN Entered By: Rogers Blocker on 05/03/2021 13:08:48 [name] ([id number]) -------------------------------------------------------------------------------- Education Screening Details Patient Name: [name] Date of Service: 05/03/2021 12:45 PM Medical Record Number: [id number] Patient Account Number: [id number] Date of Birth/Sex: 1951/04/23 (70 y.o. M) Treating RN: Rogers Blocker Primary Care Cordarious Zeek: Beverely Low Other Clinician: Referring Zayana Salvador: Gwyneth Revels Treating Kasiah Manka/Extender: Rowan Blase in Treatment: 0 Primary Learner Assessed: Patient Learning Preferences/Education Level/Primary Language Learning Preference: Explanation, Demonstration Highest Education Level: College or Above Preferred Language: English Cognitive Barrier Language Barrier: No Translator Needed: No Memory Deficit: No Emotional Barrier: No Cultural/Religious Beliefs Affecting Medical Care: No Physical Barrier Impaired Vision: No Impaired Hearing: No Knowledge/Comprehension Knowledge Level: Medium Comprehension Level: Medium Ability to understand written instructions: Medium Ability to understand verbal instructions: Medium Motivation Anxiety Level: Calm Cooperation: Cooperative Education Importance: Acknowledges Need Interest in Health Problems: Asks Questions Perception: Coherent Willingness to Engage in Self-Management Medium Activities: Readiness to Engage in Self-Management Medium Activities: Electronic Signature(s) Signed: 05/03/2021 3:53:03 PM By: Rogers Blocker RN Entered By: Rogers Blocker on 05/03/2021 13:09:26 [name] ([id number]) -------------------------------------------------------------------------------- Fall Risk Assessment Details Patient Name: [name] Date of Service: 05/03/2021 12:45 PM Medical Record Number: [id number] Patient Account Number: [id number] Date of Birth/Sex: 10/24/1950 (70 y.o. M) Treating RN:  Rogers Blocker Primary Care Jamayia Croker: Beverely Low Other Clinician: Referring Philipe Laswell: Gwyneth Revels Treating Rebekka Lobello/Extender: Rowan Blase in Treatment: 0 Fall Risk Assessment Items Have you had 2 or more falls in the last 12 monthso 0 No Have you had any fall that resulted in injury in the last 12 monthso 0 No FALLS RISK SCREEN History of falling - immediate or within 3 months 0 No Secondary diagnosis (Do you have 2 or more medical diagnoseso) 15 Yes Ambulatory aid None/bed rest/wheelchair/nurse 0 No Crutches/cane/walker 15 Yes Furniture 0 No Intravenous therapy Access/Saline/Heparin Lock 0 No Gait/Transferring Normal/ bed rest/ wheelchair 0 No Weak (  short steps with or without shuffle, stooped but able to lift head while walking, may 10 Yes seek support from furniture) Impaired (short steps with shuffle, may have difficulty arising from chair, head down, impaired 0 No balance) Mental Status Oriented to own ability 0 Yes Electronic Signature(s) Signed: 05/03/2021 3:53:03 PM By: Rogers Blocker RN Entered By: Rogers Blocker on 05/03/2021 13:09:39 [name] ([id number]) -------------------------------------------------------------------------------- Foot Assessment Details Patient Name: [name] Date of Service: 05/03/2021 12:45 PM Medical Record Number: [id number] Patient Account Number: [id number] Date of Birth/Sex: 09/15/1950 (70 y.o. M) Treating RN: Rogers Blocker Primary Care Yuya Vanwingerden: Beverely Low Other Clinician: Referring Antia Rahal: Gwyneth Revels Treating Kayliegh Boyers/Extender: Allen Derry Weeks in Treatment: 0 Foot Assessment Items Site Locations + = Sensation present, - = Sensation absent, C = Callus, U = Ulcer R = Redness, W = Warmth, M = Maceration, PU = Pre-ulcerative lesion F = Fissure, S = Swelling, D = Dryness Assessment Right: Left: Other Deformity: No No Prior Foot Ulcer: No No Prior Amputation: No No Charcot Joint: No  No Ambulatory Status: Ambulatory With Help Assistance Device: Walker Gait: Steady Electronic Signature(s) Signed: 05/03/2021 3:53:03 PM By: Rogers Blocker RN Entered By: Rogers Blocker on 05/03/2021 13:22:03 [name] ([id number]) -------------------------------------------------------------------------------- Nutrition Risk Screening Details Patient Name: [name] Date of Service: 05/03/2021 12:45 PM Medical Record Number: [id number] Patient Account Number: [id number] Date of Birth/Sex: [birth year] (70 y.o. M) Treating RN: Rogers Blocker Primary Care Jontue Crumpacker: Beverely Low Other Clinician: Referring Mckennah Kretchmer: Gwyneth Revels Treating Karlita Lichtman/Extender: Allen Derry Weeks in Treatment: 0 Height (in): 71 Weight (lbs): 255 Body Mass Index (BMI): 35.6 Nutrition Risk Screening Items Score Screening NUTRITION RISK SCREEN: I have an illness or condition that made me change the kind and/or amount of food I eat 0 No I eat fewer than two meals per day 0 No I eat few fruits and vegetables, or milk products 0 No I have three or more drinks of beer, liquor or wine almost every day 0 No I have tooth or mouth problems that make it hard for me to eat 0 No I don't always have enough money to buy the food I need 0 No I eat alone most of the time 0 No I take three or more different prescribed or over-the-counter drugs a day 1 Yes Without wanting to, I have lost or gained 10 pounds in the last six months 0 No I am not always physically able to shop, cook and/or feed myself 0 No Nutrition Protocols Good Risk Protocol 0 No interventions needed Moderate Risk Protocol High Risk Proctocol Risk Level: Good Risk Score: 1 Electronic Signature(s) Signed: 05/03/2021 3:53:03 PM By: Rogers Blocker RN Entered By: Rogers Blocker on 05/03/2021 13:10:16

## 2021-05-10 ENCOUNTER — Encounter: Payer: Medicare HMO | Admitting: Physician Assistant

## 2021-05-10 ENCOUNTER — Other Ambulatory Visit: Payer: Self-pay

## 2021-05-10 DIAGNOSIS — E11621 Type 2 diabetes mellitus with foot ulcer: Secondary | ICD-10-CM | POA: Diagnosis not present

## 2021-05-10 NOTE — Progress Notes (Addendum)
Jerry, Horn ([id number]) Visit Report for 05/10/2021 Chief Complaint Document Details Patient Name: [name], [name]. Date of Service: 05/10/2021 2:45 PM Medical Record Number: [id number] Patient Account Number: [id number] Date of Birth/Sex: 28-Jul-1951 (70 y.o. M) Treating RN: Dolan Amen Primary Care Provider: Beverlyn Roux Other Clinician: Referring Provider: Beverlyn Roux Treating Provider/Extender: Skipper Cliche in Treatment: 1 Information Obtained from: Patient Chief Complaint Bilateral foot ulcers Electronic Signature(s) Signed: 05/10/2021 3:07:47 PM By: Worthy Keeler PA-C Entered By: Worthy Keeler on 05/10/2021 15:07:47 [name] ([id number]) -------------------------------------------------------------------------------- Debridement Details Patient Name: [name] Date of Service: 05/10/2021 2:45 PM Medical Record Number: [id number] Patient Account Number: [id number] Date of Birth/Sex: November 29, 1950 (70 y.o. M) Treating RN: Dolan Amen Primary Care Provider: Beverlyn Roux Other Clinician: Referring Provider: Beverlyn Roux Treating Provider/Extender: Skipper Cliche in Treatment: 1 Debridement Performed for Wound #1 Right,Proximal,Lateral Foot Assessment: Performed By: Physician Tommie Sams., PA-C Debridement Type: Debridement Severity of Tissue Pre Debridement: Fat layer exposed Level of Consciousness (Pre- Awake and Alert procedure): Pre-procedure Verification/Time Out Yes - 15:12 Taken: Start Time: 15:12 Total Area Debrided (L x W): 1.8 (cm) x 2 (cm) = 3.6 (cm) Tissue and other material Viable, Non-Viable, Slough, Subcutaneous, Slough debrided: Level: Skin/Subcutaneous Tissue Debridement Description: Excisional Instrument: Curette Bleeding: Minimum Hemostasis Achieved: Pressure Response to Treatment: Procedure was tolerated well Level of Consciousness (Post- Awake and Alert procedure): Post Debridement Measurements of  Total Wound Length: (cm) 1.8 Width: (cm) 2 Depth: (cm) 0.5 Volume: (cm) 1.414 Character of Wound/Ulcer Post Debridement: Stable Severity of Tissue Post Debridement: Fat layer exposed Post Procedure Diagnosis Same as Pre-procedure Electronic Signature(s) Signed: 05/10/2021 4:22:57 PM By: Dolan Amen RN Signed: 05/10/2021 4:41:08 PM By: Worthy Keeler PA-C Entered By: Dolan Amen on 05/10/2021 15:13:05 [name] ([id number]) -------------------------------------------------------------------------------- Debridement Details Patient Name: [name] Date of Service: 05/10/2021 2:45 PM Medical Record Number: [id number] Patient Account Number: [id number] Date of Birth/Sex: 11-09-1950 (70 y.o. M) Treating RN: Dolan Amen Primary Care Provider: Beverlyn Roux Other Clinician: Referring Provider: Beverlyn Roux Treating Provider/Extender: Skipper Cliche in Treatment: 1 Debridement Performed for Wound #2 Right,Distal,Lateral Foot Assessment: Performed By: Physician Tommie Sams., PA-C Debridement Type: Debridement Severity of Tissue Pre Debridement: Fat layer exposed Level of Consciousness (Pre- Awake and Alert procedure): Pre-procedure Verification/Time Out Yes - 15:12 Taken: Start Time: 15:12 Total Area Debrided (L x W): 2.2 (cm) x 1.1 (cm) = 2.42 (cm) Tissue and other material Viable, Non-Viable, Slough, Subcutaneous, Slough debrided: Level: Skin/Subcutaneous Tissue Debridement Description: Excisional Instrument: Curette Bleeding: Minimum Hemostasis Achieved: Pressure Response to Treatment: Procedure was tolerated well Level of Consciousness (Post- Awake and Alert procedure): Post Debridement Measurements of Total Wound Length: (cm) 2.2 Width: (cm) 1.1 Depth: (cm) 0.2 Volume: (cm) 0.38 Character of Wound/Ulcer Post Debridement: Stable Severity of Tissue Post Debridement: Fat layer exposed Post Procedure Diagnosis Same as  Pre-procedure Electronic Signature(s) Signed: 05/10/2021 4:22:57 PM By: Dolan Amen RN Signed: 05/10/2021 4:41:08 PM By: Worthy Keeler PA-C Entered By: Dolan Amen on 05/10/2021 15:13:36 [name] ([id number]) -------------------------------------------------------------------------------- HPI Details Patient Name: [name] Date of Service: 05/10/2021 2:45 PM Medical Record Number: [id number] Patient Account Number: [id number] Date of Birth/Sex: Sep 02, 1950 (69 y.o. 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 Has been seen by [facility]iatry [facility] for a number of years it sounds like at least 4 years. With that being said they have tried a split thickness skin graft along with multiple other topical remedies over the time they have been seeing him over the past several years. Unfortunately they are not able to get this closed I did refer him to [place] for further evaluation and treatment. The patient tells me has been quite sometime since he remembers having an x-ray and again we could not find anything recent when looking through his chart. Fortunately there does not appear to be any signs of active infection systemically which is great news. No fevers, chills, nausea, vomiting, or diarrhea. The patient does have diabetes with his most recent hemoglobin A1c being November 14, 2020 at 9.1. He also does have evidence of lymphedema based on what I am seeing today. 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 05/10/2021 upon evaluation today patient appears to be doing well with regard to his wounds in general. I feel like he is making progress. Fortunately the compression wrap did well for him and overall I am extremely happy with where we stand. No fevers, chills, nausea, vomiting, or diarrhea. Electronic Signature(s) Signed: 05/10/2021 3:19:16 PM By: Worthy Keeler PA-C Entered By: Worthy Keeler on 05/10/2021 15:19:16 [name] ([id number]) -------------------------------------------------------------------------------- Physical Exam Details Patient Name: FILIP, LUTEN Date of Service: 05/10/2021 2:45 PM Medical Record Number: [id number] Patient Account Number: [id number] Date of Birth/Sex: [birth year] (70 y.o. M) Treating RN: Dolan Amen Primary Care Provider: Beverlyn Roux Other Clinician: Referring Provider: Beverlyn Roux Treating Provider/Extender: Skipper Cliche in Treatment: 1 Constitutional Well-nourished and well-hydrated in no acute distress. Respiratory normal breathing without difficulty. Psychiatric this patient is able to make decisions and demonstrates good insight into disease process. Alert and Oriented x 3. pleasant and cooperative. Notes Upon inspection patient's wound bed showed signs of good granulation epithelization at this point. Fortunately there does not appear to be any evidence of infection which is great and overall I am extremely pleased with where we stand today. Electronic Signature(s) Signed: 05/10/2021 3:19:30 PM By: Worthy Keeler PA-C Entered By: Worthy Keeler on 05/10/2021 15:19:30 [name] ([id number]) -------------------------------------------------------------------------------- Physician Orders Details Patient Name: [name] Date of Service: 05/10/2021 2:45  PM Medical Record Number: [id number] Patient Account Number: [id number] Date of Birth/Sex: 01-24-1951 (70 y.o. 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 o Nurse Visit as needed - Call for nurse visit IF compression wrap: 1.  Gets wet 2. Slides down to about a third of the leg or 3. Gets torn or messed up [employer] o May shower with wound dressing protected with water repellent cover or cast protector. o No tub bath. Edema Control - Lymphedema / Segmental Compressive Device / Other Right Lower Extremity o Optional: One layer of unna paste to top of compression wrap (to act as an anchor). o Elevate legs to the level of the heart and pump ankles as often as possible o Elevate leg(s) parallel to the floor when sitting. o Compression Pump: Use compression pump on left lower extremity for 60 minutes, twice daily. o Compression Pump: Use compression pump on right lower extremity for 60 minutes, twice daily. Wound Treatment Wound #1 - Foot Wound Laterality: Right, Lateral, Proximal Cleanser: Wound Cleanser 1 x Per Week/30 Days Discharge Instructions: Wash your hands with soap and water. Remove old dressing, discard into plastic bag and place into trash. Cleanse the wound with Wound  Cleanser prior to applying a clean dressing using gauze sponges, not tissues or cotton balls. Do not scrub or use excessive force. Pat dry using gauze sponges, not tissue or cotton balls. Primary Dressing: Hydrofera Blue Ready Transfer Foam, 2.5x2.5 (in/in) 1 x Per Week/30 Days Discharge Instructions: Apply Hydrofera Blue Ready to wound bed as directed Secondary Dressing: ABD Pad 5x9 (in/in) 1 x Per Week/30 Days Discharge Instructions: Cover with ABD pad Compression Wrap: Profore Lite LF 3 Multilayer Compression Bandaging System 1 x Per Week/30 Days Discharge Instructions: Apply 3 multi-layer wrap as prescribed. Wound #2 - Foot Wound Laterality: Right, Lateral, Distal Cleanser: [employer] Ancillary Kit - 15 Day Supply (Generic) 1 x Per Week/30 Days Discharge Instructions: Use supplies as instructed; Kit contains: (15) Saline Bullets; (15) 3x3 Gauze; 15 pr Gloves Cleanser: Wound Cleanser 1 x Per Week/30 Days Discharge Instructions: Wash your hands with soap and water. Remove old dressing, discard into plastic bag and place into trash. Cleanse the wound with Wound Cleanser prior to applying a clean dressing using gauze sponges, not tissues or cotton balls. Do not scrub or use excessive force. Pat dry using gauze sponges, not tissue or cotton balls. Primary Dressing: Hydrofera Blue Ready Transfer Foam, 2.5x2.5 (in/in) (Generic) 1 x Per Week/30 Days LAVONTAY, KIRK ([id number]) Discharge Instructions: Apply Hydrofera Blue Ready to wound bed as directed Secondary Dressing: ABD Pad 5x9 (in/in) 1 x Per Week/30 Days Discharge Instructions: Cover with ABD pad Compression Wrap: Profore Lite LF 3 Multilayer Compression Bandaging System 1 x Per Week/30 Days Discharge Instructions: Apply 3 multi-layer wrap as prescribed. Wound #3 - Toe Second Wound Laterality: Right Cleanser: Wound Cleanser 3 x Per Week/30 Days Discharge Instructions: Wash your hands with soap and water. Remove old dressing, discard into  plastic bag and place into trash. Cleanse the wound with Wound Cleanser prior to applying a clean dressing using gauze sponges, not tissues or cotton balls. Do not scrub or use excessive force. Pat dry using gauze sponges, not tissue or cotton balls. Primary Dressing: Hydrofera Blue Ready Transfer Foam, 2.5x2.5 (in/in) 3 x Per Week/30 Days Discharge Instructions: Apply Hydrofera Blue Ready to wound bed as directed Secondary Dressing: Coverlet Latex-Free Fabric Adhesive Dressings 3 x Per Week/30 Days Discharge Instructions: 1.5 x 2 Wound #4 - Toe Great Wound Laterality: Right Cleanser: Wound Cleanser 3 x Per Week/30 Days Discharge Instructions: Wash your hands with soap and water. Remove old dressing, discard into plastic bag and place into trash. Cleanse the wound with Wound Cleanser  prior to applying a clean dressing using gauze sponges, not tissues or cotton balls. Do not scrub or use excessive force. Pat dry using gauze sponges, not tissue or cotton balls. Primary Dressing: Hydrofera Blue Ready Transfer Foam, 2.5x2.5 (in/in) 3 x Per Week/30 Days Discharge Instructions: Apply Hydrofera Blue Ready to wound bed as directed Secondary Dressing: Coverlet Latex-Free Fabric Adhesive Dressings 3 x Per Week/30 Days Discharge Instructions: 1.5 x 2 Wound #5 - Toe Second Wound Laterality: Left Cleanser: Wound Cleanser 3 x Per Week/30 Days Discharge Instructions: Wash your hands with soap and water. Remove old dressing, discard into plastic bag and place into trash. Cleanse the wound with Wound Cleanser prior to applying a clean dressing using gauze sponges, not tissues or cotton balls. Do not scrub or use excessive force. Pat dry using gauze sponges, not tissue or cotton balls. Primary Dressing: Hydrofera Blue Ready Transfer Foam, 2.5x2.5 (in/in) 3 x Per Week/30 Days Discharge Instructions: Apply Hydrofera Blue Ready to wound bed as directed Secondary Dressing: Coverlet Latex-Free Fabric Adhesive  Dressings 3 x Per Week/30 Days Discharge Instructions: 1.5 x 2 Wound #6 - Toe Great Wound Laterality: Left Cleanser: Wound Cleanser 3 x Per Week/30 Days Discharge Instructions: Wash your hands with soap and water. Remove old dressing, discard into plastic bag and place into trash. Cleanse the wound with Wound Cleanser prior to applying a clean dressing using gauze sponges, not tissues or cotton balls. Do not scrub or use excessive force. Pat dry using gauze sponges, not tissue or cotton balls. Primary Dressing: Hydrofera Blue Ready Transfer Foam, 2.5x2.5 (in/in) 3 x Per Week/30 Days Discharge Instructions: Apply Hydrofera Blue Ready to wound bed as directed Secondary Dressing: Coverlet Latex-Free Fabric Adhesive Dressings 3 x Per Week/30 Days Discharge Instructions: 1.5 x 2 Wound #7 - Foot Wound Laterality: Dorsal, Left Cleanser: Wound Cleanser 3 x Per Week/30 Days Discharge Instructions: Wash your hands with soap and water. Remove old dressing, discard into plastic bag and place into trash. Cleanse the wound with Wound Cleanser prior to applying a clean dressing using gauze sponges, not tissues or cotton balls. Do not scrub or use excessive force. Pat dry using gauze sponges, not tissue or cotton balls. Primary Dressing: Hydrofera Blue Ready Transfer Foam, 2.5x2.5 (in/in) 3 x Per Week/30 Days Discharge Instructions: Apply Hydrofera Blue Ready to wound bed as directed Secondary Dressing: Coverlet Latex-Free Fabric Adhesive Dressings 3 x Per Week/30 Days MUHAMMADALI, RIES ([id number]) Discharge Instructions: 1.5 x 2 Electronic Signature(s) Signed: 05/10/2021 4:22:57 PM By: Dolan Amen RN Signed: 05/10/2021 4:41:08 PM By: Worthy Keeler PA-C Entered By: Dolan Amen on 05/10/2021 15:15:32 [name] ([id number]) -------------------------------------------------------------------------------- Problem List Details Patient Name: [name] Date of Service: 05/10/2021 2:45  PM Medical Record Number: [id number] Patient Account Number: [id number] Date of Birth/Sex: Aug 11, 1951 (70 y.o. 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) 05/03/2021 No Yes I10 Essential (primary) hypertension 05/03/2021 No Yes Z79.01 Long term (current) use of anticoagulants 05/03/2021 No Yes Z86.718 Personal history of other venous thrombosis and embolism  05/03/2021 No Yes Inactive Problems Resolved Problems Electronic Signature(s) Signed: 05/10/2021 3:07:40 PM By: Worthy Keeler PA-C Previous Signature: 05/10/2021 3:07:16 PM Version By: Worthy Keeler PA-C Entered By: Worthy Keeler on 05/10/2021 15:07:40 [name] ([id number]) -------------------------------------------------------------------------------- Progress Note Details Patient Name: [name] Date of Service: 05/10/2021 2:45 PM Medical Record Number: [id number] Patient Account Number: [id number] Date of Birth/Sex: [birth year] (70 y.o. 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 Has been seen by [facility]ry [facility] for a number of years it sounds like at least 4 years. With that being said they have tried a split thickness skin graft along with multiple other topical remedies over the time they have been seeing him over the past several years. Unfortunately they are not able to get this closed I did refer him to [place] for further evaluation and treatment. The patient tells me has been quite sometime since he remembers having an x-ray and again we could not find anything recent when looking through his chart. Fortunately there does not appear to be any signs of active infection systemically which is great news. No fevers, chills, nausea, vomiting, or diarrhea. The patient does have diabetes with his most recent hemoglobin A1c being November 14, 2020 at 9.1. He also does have evidence of lymphedema based on what I am seeing today. 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 05/10/2021 upon evaluation today patient appears to be doing well with regard to his wounds in general. I feel like he is making progress. Fortunately the compression wrap did well for him and overall I am extremely happy with where we stand. No fevers, chills, nausea, vomiting, or diarrhea. Objective Constitutional Well-nourished and well-hydrated in no acute distress. Vitals Time Taken: 2:41 PM, Height: 71 in, Weight: 255 lbs, BMI: 35.6, Temperature: 97.9 F, Pulse: 76 bpm, Respiratory Rate: 18 breaths/min, Blood Pressure: 146/74 mmHg. Respiratory normal breathing without difficulty. Psychiatric this patient is able to make decisions and demonstrates good insight into disease process. Alert and Oriented x 3. pleasant and  cooperative. General Notes: Upon inspection patient's wound bed showed signs of good granulation epithelization at this point. Fortunately there does not appear to be any evidence of infection which is great and overall I am extremely pleased with where we stand today. Integumentary (Hair, Skin) Wound #1 status is Open. Original cause of wound was Gradually Appeared. The date acquired was: 05/03/2020. The wound has been in treatment 1 weeks. The wound is located on the Right,Proximal,Lateral Foot. The wound measures 1.8cm length x 2cm width x 0.4cm depth; 2.827cm^2 area and 1.131cm^3 volume. There is Fat Layer (Subcutaneous Tissue) exposed. There is no tunneling noted, however, there is undermining starting at 7:00 and ending at 11:00 with a maximum distance of 0.6cm. There is a large amount of serosanguineous drainage noted. There is large (67-100%) red, pink granulation within the  wound bed. There is a small (1-33%) amount of necrotic tissue within the wound bed including Adherent Slough. Wound #2 status is Open. Original cause of wound was Gradually Appeared. The date acquired was: 05/03/2020. The wound has been in treatment 1 weeks. The wound is located on the Right,Distal,Lateral Foot. The wound measures 2.2cm length x 1.1cm width x 0.1cm depth; 1.901cm^2 area and 0.19cm^3 volume. There is Fat Layer (Subcutaneous Tissue) exposed. There is no tunneling or undermining noted. There is a medium amount of serosanguineous drainage noted. There is large (67-100%) red granulation within the wound bed. There is a small (1-33%) amount of necrotic tissue within the wound bed including Adherent Slough. Wound #3 status is Open. Original cause of wound was Gradually Appeared. The date acquired was: 05/03/2021. The wound has been in treatment KEAGEN, HEINLEN. ([id number]) 1 weeks. The wound is located on the Right Toe Second. The wound measures 2.5cm length x 0.1cm width x 0.1cm depth; 0.196cm^2 area  and 0.02cm^3 volume. There is Fat Layer (Subcutaneous Tissue) exposed. There is no tunneling or undermining noted. There is a large amount of serous drainage noted. There is no granulation within the wound bed. There is no necrotic tissue within the wound bed. Wound #4 status is Open. Original cause of wound was Gradually Appeared. The date acquired was: 05/03/2021. The wound has been in treatment 1 weeks. The wound is located on the Right Toe Great. The wound measures 0.5cm length x 0.3cm width x 0.2cm depth; 0.118cm^2 area and 0.024cm^3 volume. There is Fat Layer (Subcutaneous Tissue) exposed. There is no tunneling or undermining noted. There is a medium amount of serosanguineous drainage noted. There is small (1-33%) pink granulation within the wound bed. There is a large (67-100%) amount of necrotic tissue within the wound bed including Adherent Slough. Wound #5 status is Open. Original cause of wound was Gradually Appeared. The date acquired was: 05/03/2021. The wound has been in treatment 1 weeks. The wound is located on the Left Toe Second. The wound measures 0.4cm length x 0.3cm width x 0.2cm depth; 0.094cm^2 area and 0.019cm^3 volume. There is Fat Layer (Subcutaneous Tissue) exposed. There is no tunneling or undermining noted. There is a medium amount of serosanguineous drainage noted. There is large (67-100%) red granulation within the wound bed. There is no necrotic tissue within the wound bed. Wound #6 status is Open. Original cause of wound was Gradually Appeared. The date acquired was: 05/10/2021. The wound is located on the Left Toe Great. The wound measures 0.5cm length x 0.2cm width x 0.1cm depth; 0.079cm^2 area and 0.008cm^3 volume. There is Fat Layer (Subcutaneous Tissue) exposed. There is no tunneling or undermining noted. There is a medium amount of serous drainage noted. There is small (1-33%) red granulation within the wound bed. There is a large (67-100%) amount of necrotic tissue  within the wound bed including Adherent Slough. Wound #7 status is Open. Original cause of wound was Gradually Appeared. The date acquired was: 05/10/2021. The wound is located on the Left,Dorsal Foot. The wound measures 0.1cm length x 0.1cm width x 0.1cm depth; 0.008cm^2 area and 0.001cm^3 volume. There is Fat Layer (Subcutaneous Tissue) exposed. There is no tunneling or undermining noted. There is a medium amount of serous drainage noted. There is no granulation within the wound bed. There is a large (67-100%) amount of necrotic tissue within the wound bed including Adherent Slough. 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 There was a Excisional Skin/Subcutaneous Tissue Debridement with a total area of 3.6 sq cm performed by Tommie Sams., PA-C. With the following instrument(s): Curette to remove Viable and Non-Viable tissue/material. Material removed includes Subcutaneous Tissue and Slough and. A time out was conducted at 15:12, prior to the start of the procedure. A Minimum amount of bleeding was controlled with Pressure. The procedure was tolerated well. Post Debridement Measurements: 1.8cm length x 2cm width x 0.5cm depth; 1.414cm^3 volume. Character of Wound/Ulcer Post Debridement is stable. Severity of Tissue Post Debridement is: Fat layer exposed. 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 There was a Three Layer Compression Therapy Procedure by Dolan Amen, RN. Post procedure Diagnosis Wound #1: Same as Pre-Procedure Notes: pt tolerating wrap well. Wound #2 Pre-procedure diagnosis of Wound #2 is a Diabetic Wound/Ulcer of the Lower Extremity located on the Right,Distal,Lateral Foot .Severity of Tissue Pre Debridement is: Fat layer exposed. There was a Excisional Skin/Subcutaneous Tissue Debridement with a total area of 2.42 sq cm performed by Tommie Sams., PA-C. With the following instrument(s): Curette to remove Viable and Non-Viable tissue/material. Material removed includes Subcutaneous Tissue and Slough and. A time out was conducted at 15:12, prior to the start of the procedure. A Minimum amount of bleeding was controlled with Pressure. The procedure was tolerated well. Post Debridement Measurements: 2.2cm length x 1.1cm width x 0.2cm depth; 0.38cm^3 volume. Character of Wound/Ulcer Post Debridement is stable. Severity of Tissue Post Debridement is: Fat layer exposed. Post procedure Diagnosis Wound #2: Same as Pre-Procedure LYRIK, BURESH ([id number]) Pre-procedure diagnosis of Wound #2 is a Diabetic Wound/Ulcer of the Lower Extremity located on the Right,Distal,Lateral Foot . There was a Three Layer Compression Therapy Procedure by Dolan Amen, RN. Post procedure Diagnosis Wound #2: Same as Pre-Procedure Notes: pt tolerating wrap well. Plan Follow-up Appointments: Return Appointment in 1 week. Nurse Visit as needed - Call for nurse visit IF compression wrap: 1. Gets wet 2. Slides down to about a third of the leg or 3. Gets torn or messed up Bathing/ Shower/ Hygiene: May shower with wound dressing protected with water repellent cover or cast protector. No tub bath. Edema Control - Lymphedema / Segmental Compressive Device / Other: Optional: One layer of  unna paste to top of compression wrap (to act as an anchor). Elevate legs to the level of the heart and pump ankles as often as possible Elevate leg(s) parallel to the floor when sitting. Compression Pump: Use compression pump on left lower extremity for 60 minutes, twice daily. Compression Pump: Use compression pump on right lower extremity for 60 minutes, twice daily. WOUND #1: - Foot Wound Laterality: Right, Lateral, Proximal Cleanser: Wound Cleanser 1 x Per Week/30 Days Discharge Instructions: Wash your hands with soap and water. Remove old dressing, discard into plastic bag and place into trash. Cleanse the wound with Wound Cleanser prior to applying a clean dressing using gauze sponges, not tissues or cotton balls. Do not  scrub or use excessive force. Pat dry using gauze sponges, not tissue or cotton balls. Primary Dressing: Hydrofera Blue Ready Transfer Foam, 2.5x2.5 (in/in) 1 x Per Week/30 Days Discharge Instructions: Apply Hydrofera Blue Ready to wound bed as directed Secondary Dressing: ABD Pad 5x9 (in/in) 1 x Per Week/30 Days Discharge Instructions: Cover with ABD pad Compression Wrap: Profore Lite LF 3 Multilayer Compression Bandaging System 1 x Per Week/30 Days Discharge Instructions: Apply 3 multi-layer wrap as prescribed. WOUND #2: - Foot Wound Laterality: Right, Lateral, Distal Cleanser: [place] Ancillary Kit - 15 Day Supply (Generic) 1 x Per Week/30 Days Discharge Instructions: Use supplies as instructed; Kit contains: (15) Saline Bullets; (15) 3x3 Gauze; 15 pr Gloves Cleanser: Wound Cleanser 1 x Per Week/30 Days Discharge Instructions: Wash your hands with soap and water. Remove old dressing, discard into plastic bag and place into trash. Cleanse the wound with Wound Cleanser prior to applying a clean dressing using gauze sponges, not tissues or cotton balls. Do not scrub or use excessive force. Pat dry using gauze sponges, not tissue or cotton balls. Primary Dressing:  Hydrofera Blue Ready Transfer Foam, 2.5x2.5 (in/in) (Generic) 1 x Per Week/30 Days Discharge Instructions: Apply Hydrofera Blue Ready to wound bed as directed Secondary Dressing: ABD Pad 5x9 (in/in) 1 x Per Week/30 Days Discharge Instructions: Cover with ABD pad Compression Wrap: Profore Lite LF 3 Multilayer Compression Bandaging System 1 x Per Week/30 Days Discharge Instructions: Apply 3 multi-layer wrap as prescribed. WOUND #3: - Toe Second Wound Laterality: Right Cleanser: Wound Cleanser 3 x Per Week/30 Days Discharge Instructions: Wash your hands with soap and water. Remove old dressing, discard into plastic bag and place into trash. Cleanse the wound with Wound Cleanser prior to applying a clean dressing using gauze sponges, not tissues or cotton balls. Do not scrub or use excessive force. Pat dry using gauze sponges, not tissue or cotton balls. Primary Dressing: Hydrofera Blue Ready Transfer Foam, 2.5x2.5 (in/in) 3 x Per Week/30 Days Discharge Instructions: Apply Hydrofera Blue Ready to wound bed as directed Secondary Dressing: Coverlet Latex-Free Fabric Adhesive Dressings 3 x Per Week/30 Days Discharge Instructions: 1.5 x 2 WOUND #4: - Toe Great Wound Laterality: Right Cleanser: Wound Cleanser 3 x Per Week/30 Days Discharge Instructions: Wash your hands with soap and water. Remove old dressing, discard into plastic bag and place into trash. Cleanse the wound with Wound Cleanser prior to applying a clean dressing using gauze sponges, not tissues or cotton balls. Do not scrub or use excessive force. Pat dry using gauze sponges, not tissue or cotton balls. Primary Dressing: Hydrofera Blue Ready Transfer Foam, 2.5x2.5 (in/in) 3 x Per Week/30 Days Discharge Instructions: Apply Hydrofera Blue Ready to wound bed as directed Secondary Dressing: Coverlet Latex-Free Fabric Adhesive Dressings 3 x Per Week/30 Days Discharge Instructions: 1.5 x 2 WOUND #5: - Toe Second Wound Laterality:  Left Cleanser: Wound Cleanser 3 x Per Week/30 Days Discharge Instructions: Wash your hands with soap and water. Remove old dressing, discard into plastic bag and place into trash. Cleanse the wound with Wound Cleanser prior to applying a clean dressing using gauze sponges, not tissues or cotton balls. Do not scrub or use excessive force. Pat dry using gauze sponges, not tissue or cotton balls. Primary Dressing: Hydrofera Blue Ready Transfer Foam, 2.5x2.5 (in/in) 3 x Per Week/30 Days Discharge Instructions: Apply Hydrofera Blue Ready to wound bed as directed Secondary Dressing: Coverlet Latex-Free Fabric Adhesive Dressings 3 x Per Week/30 Days Discharge Instructions: 1.5 x 2 WOUND #6: -  Toe Great Wound Laterality: Left Cleanser: Wound Cleanser 3 x Per Week/30 Days Discharge Instructions: Wash your hands with soap and water. Remove old dressing, discard into plastic bag and place into trash. GEDEON, BRANDOW ([id number]) Cleanse the wound with Wound Cleanser prior to applying a clean dressing using gauze sponges, not tissues or cotton balls. Do not scrub or use excessive force. Pat dry using gauze sponges, not tissue or cotton balls. Primary Dressing: Hydrofera Blue Ready Transfer Foam, 2.5x2.5 (in/in) 3 x Per Week/30 Days Discharge Instructions: Apply Hydrofera Blue Ready to wound bed as directed Secondary Dressing: Coverlet Latex-Free Fabric Adhesive Dressings 3 x Per Week/30 Days Discharge Instructions: 1.5 x 2 WOUND #7: - Foot Wound Laterality: Dorsal, Left Cleanser: Wound Cleanser 3 x Per Week/30 Days Discharge Instructions: Wash your hands with soap and water. Remove old dressing, discard into plastic bag and place into trash. Cleanse the wound with Wound Cleanser prior to applying a clean dressing using gauze sponges, not tissues or cotton balls. Do not scrub or use excessive force. Pat dry using gauze sponges, not tissue or cotton balls. Primary Dressing: Hydrofera Blue Ready  Transfer Foam, 2.5x2.5 (in/in) 3 x Per Week/30 Days Discharge Instructions: Apply Hydrofera Blue Ready to wound bed as directed Secondary Dressing: Coverlet Latex-Free Fabric Adhesive Dressings 3 x Per Week/30 Days Discharge Instructions: 1.5 x 2 1. Would recommend currently that we go ahead and continue with the wound care measures as before and the patient is in agreement with plan. Specifically this is in regard to the [facility] as well as 3 layer compression wrap which I feel like is doing a good job for the right leg. 2. In regards to the left leg I think that we can continue with the [facility] and just secure with Band-Aids which is fine. 3. I do think the patient would benefit from home health but unfortunately he is not really homebound it is a good thing for him but at the same time makes it somewhat impossible for [place] to get home health. We will see patient back for reevaluation in 1 week here in the clinic. If anything worsens or changes patient will contact our office for additional recommendations. Electronic Signature(s) Signed: 05/10/2021 3:20:19 PM By: Worthy Keeler PA-C Entered By: Worthy Keeler on 05/10/2021 15:20:19 [name] ([id number]) -------------------------------------------------------------------------------- SuperBill Details Patient Name: [name] Date of Service: 05/10/2021 Medical Record Number: [id number] Patient Account Number: [id number] Date of Birth/Sex: 1950-12-03 (70 y.o. 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  Acquired absence of other right toe(s) I10 Essential (primary) hypertension Z79.01 Long term (current) use of anticoagulants Z86.718 Personal history of other venous thrombosis and embolism Facility Procedures CPT4 Code: 30160109 Description: 32355 - DEB SUBQ TISSUE 20 SQ CM/< Modifier: Quantity: 1 CPT4 Code: Description: ICD-10 Diagnosis Description L97.512 Non-pressure chronic ulcer of other part of right foot with fat layer ex Modifier: posed Quantity: Physician Procedures CPT4 Code: 7322025 Description: 99214 - WC PHYS LEVEL 4 - EST PT Modifier: 25 Quantity: 1 CPT4 Code: Description: ICD-10 Diagnosis Description E11.621 Type 2 diabetes mellitus with foot ulcer L97.512 Non-pressure chronic ulcer of other  part of right foot with fat layer ex L97.522 Non-pressure chronic ulcer of other part of left foot with fat layer exp  Z89.421 Acquired absence of other right toe(s) Modifier: posed osed Quantity: CPT4 Code: 9539672 Description: 89791 - WC PHYS SUBQ TISS 20 SQ CM Modifier: Quantity: 1 CPT4 Code: Description: ICD-10 Diagnosis Description R04.136 Non-pressure chronic ulcer of other part of right foot with fat layer ex Modifier: posed Quantity: Electronic Signature(s) Signed: 05/10/2021 3:20:39 PM By: Worthy Keeler PA-C Entered By: Worthy Keeler on 05/10/2021 15:20:38

## 2021-05-10 NOTE — Progress Notes (Signed)
[name], [name] ([id number]) Visit Report for 05/10/2021 Arrival Information Details Patient Name: [name], [name] Date of Service: 05/10/2021 2:45 PM Medical Record Number: [id number] Patient Account Number: [id number] Date of Birth/Sex: 01/25/1951 (70 y.o. M) Treating RN: Dolan Amen Primary Care Kevonte Vanecek: Beverlyn Roux Other Clinician: Referring Heer Justiss: Beverlyn Roux Treating Baron Parmelee/Extender: Skipper Cliche in Treatment: 1 Visit Information History Since Last Visit Has Dressing in Place as Prescribed: Yes Patient Arrived: Walker Pain Present Now: No Arrival Time: 14:41 Accompanied By: self Transfer Assistance: None Patient Identification Verified: Yes Secondary Verification Process Completed: Yes Electronic Signature(s) Signed: 05/10/2021 4:22:57 PM By: Dolan Amen RN Entered By: Dolan Amen on 05/10/2021 14:41:39 [name] ([id number]) -------------------------------------------------------------------------------- Clinic Level of Care Assessment Details Patient Name: [name] Date of Service: 05/10/2021 2:45 PM Medical Record Number: [id number] Patient Account Number: [id number] Date of Birth/Sex: 01-01-1951 (70 y.o. M) Treating RN: Dolan Amen Primary Care Trentin Knappenberger: Beverlyn Roux Other Clinician: Referring Chenise Mulvihill: Beverlyn Roux Treating Kharizma Lesnick/Extender: Skipper Cliche in Treatment: 1 Clinic Level of Care Assessment Items TOOL 1 Quantity Score []  - Use when EandM and Procedure is performed on INITIAL visit 0 ASSESSMENTS - Nursing Assessment / Reassessment []  - General Physical Exam (combine w/ comprehensive assessment (listed just below) when performed on new 0 pt. evals) []  - 0 Comprehensive Assessment (HX, ROS, Risk Assessments, Wounds Hx, etc.) ASSESSMENTS - Wound and Skin Assessment / Reassessment []  - Dermatologic / Skin Assessment (not related to wound area) 0 ASSESSMENTS - Ostomy and/or Continence Assessment and  Care []  - Incontinence Assessment and Management 0 []  - 0 Ostomy Care Assessment and Management (repouching, etc.) PROCESS - Coordination of Care []  - Simple Patient / Family Education for ongoing care 0 []  - 0 Complex (extensive) Patient / Family Education for ongoing care []  - 0 Staff obtains Programmer, systems, Records, Test Results / Process Orders []  - 0 Staff telephones HHA, Nursing Homes / Clarify orders / etc []  - 0 Routine Transfer to another Facility (non-emergent condition) []  - 0 Routine Hospital Admission (non-emergent condition) []  - 0 New Admissions / Biomedical engineer / Ordering NPWT, Apligraf, etc. []  - 0 Emergency Hospital Admission (emergent condition) PROCESS - Special Needs []  - Pediatric / Minor Patient Management 0 []  - 0 Isolation Patient Management []  - 0 Hearing / Language / Visual special needs []  - 0 Assessment of Community assistance (transportation, D/C planning, etc.) []  - 0 Additional assistance / Altered mentation []  - 0 Support Surface(s) Assessment (bed, cushion, seat, etc.) INTERVENTIONS - Miscellaneous []  - External ear exam 0 []  - 0 Patient Transfer (multiple staff / Civil Service fast streamer / Similar devices) []  - 0 Simple Staple / Suture removal (25 or less) []  - 0 Complex Staple / Suture removal (26 or more) []  - 0 Hypo/Hyperglycemic Management (do not check if billed separately) []  - 0 Ankle / Brachial Index (ABI) - do not check if billed separately Has the patient been seen at the hospital within the last three years: Yes Total Score: 0 Level Of Care: ____ [name] ([id number]) Electronic Signature(s) Signed: 05/10/2021 4:22:57 PM By: Dolan Amen RN Entered By: Dolan Amen on 05/10/2021 15:16:22 [name] ([id number]) -------------------------------------------------------------------------------- Compression Therapy Details Patient Name: [name] Date of Service: 05/10/2021 2:45 PM Medical Record  Number: [id number] Patient Account Number: [id number] Date of Birth/Sex: 08-01-1951 (70 y.o. M) Treating RN: Dolan Amen Primary Care Kong Packett: Beverlyn Roux Other Clinician: Referring Adell Koval: Beverlyn Roux Treating Annamarie Yamaguchi/Extender: Jeri Cos  Weeks in Treatment: 1 Compression Therapy Performed for Wound Assessment: Wound #1 Right,Proximal,Lateral Foot Performed By: Cora Daniels, RN Compression Type: Three Layer Post Procedure Diagnosis Same as Pre-procedure Notes pt tolerating wrap well Electronic Signature(s) Signed: 05/10/2021 4:22:57 PM By: Dolan Amen RN Entered By: Dolan Amen on 05/10/2021 15:14:12 [name] ([id number]) -------------------------------------------------------------------------------- Compression Therapy Details Patient Name: [name] Date of Service: 05/10/2021 2:45 PM Medical Record Number: [id number] Patient Account Number: [id number] Date of Birth/Sex: 1951/03/08 (70 y.o. M) Treating RN: Dolan Amen Primary Care Euclide Granito: Beverlyn Roux Other Clinician: Referring Kartier Bennison: Beverlyn Roux Treating Teal Raben/Extender: Skipper Cliche in Treatment: 1 Compression Therapy Performed for Wound Assessment: Wound #2 Right,Distal,Lateral Foot Performed By: Cora Daniels, RN Compression Type: Three Layer Post Procedure Diagnosis Same as Pre-procedure Notes pt tolerating wrap well Electronic Signature(s) Signed: 05/10/2021 4:22:57 PM By: Dolan Amen RN Entered By: Dolan Amen on 05/10/2021 15:14:13 [name] ([id number]) -------------------------------------------------------------------------------- Encounter Discharge Information Details Patient Name: [name] Date of Service: 05/10/2021 2:45 PM Medical Record Number: [id number] Patient Account Number: [id number] Date of Birth/Sex: Jan 27, 1951 (70 y.o. M) Treating RN: Dolan Amen Primary Care Whitman Meinhardt: Beverlyn Roux Other  Clinician: Referring Mirinda Monte: Beverlyn Roux Treating Yvonnie Schinke/Extender: Skipper Cliche in Treatment: 1 Encounter Discharge Information Items Post Procedure Vitals Discharge Condition: Stable Temperature (F): 97.9 Ambulatory Status: Walker Pulse (bpm): 76 Discharge Destination: Home Respiratory Rate (breaths/min): 18 Transportation: Private Auto Blood Pressure (mmHg): 146/74 Accompanied By: self Schedule Follow-up Appointment: Yes Clinical Summary of Care: Electronic Signature(s) Signed: 05/10/2021 4:22:57 PM By: Dolan Amen RN Entered By: Dolan Amen on 05/10/2021 16:20:47 [name] ([id number]) -------------------------------------------------------------------------------- Lower Extremity Assessment Details Patient Name: [name] Date of Service: 05/10/2021 2:45 PM Medical Record Number: [id number] Patient Account Number: [id number] Date of Birth/Sex: Jan 06, 1951 (70 y.o. M) Treating RN: Dolan Amen Primary Care Herrick Hartog: Beverlyn Roux Other Clinician: Referring Mazal Ebey: Beverlyn Roux Treating Nathyn Luiz/Extender: Jeri Cos Weeks in Treatment: 1 Edema Assessment Assessed: [Left: Yes] [Right: Yes] Edema: [Left: No] [Right: No] Vascular Assessment Pulses: Dorsalis Pedis Palpable: [Left:Yes] [Right:Yes] Electronic Signature(s) Signed: 05/10/2021 4:22:57 PM By: Dolan Amen RN Entered By: Dolan Amen on 05/10/2021 15:06:29 [name] ([id number]) -------------------------------------------------------------------------------- Multi Wound Chart Details Patient Name: [name] Date of Service: 05/10/2021 2:45 PM Medical Record Number: [id number] Patient Account Number: [id number] Date of Birth/Sex: [birth year] (70 y.o. 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 Extremity Extremity Comorbid History: Cataracts, Lymphedema, Deep Vein Cataracts, Lymphedema, Deep Vein Cataracts, Lymphedema, Deep Vein Thrombosis, Hypertension, Type II Thrombosis, Hypertension, Type II Thrombosis, Hypertension, Type II Diabetes, Osteoarthritis, Diabetes, Osteoarthritis, Diabetes, Osteoarthritis, Osteomyelitis Osteomyelitis Osteomyelitis Date Acquired: 05/03/2020 05/03/2020 05/03/2021 Weeks of Treatment: 1 1 1  Wound Status: Open Open Open Clustered Wound: No No Yes Clustered Quantity: N/A N/A 3 Measurements L x W x D (cm) 1.8x2x0.4 2.2x1.1x0.1 2.5x0.1x0.1 Area (cm) : 2.827 1.901 0.196 Volume (cm) : 1.131 0.19 0.02 % Reduction in Area: 5.30% 22.40% 91.70% % Reduction in Volume: 36.90% 61.20% 95.80% Starting Position  1 (o'clock): 7 Ending Position 1 (o'clock): 11 Maximum Distance 1 (cm): 0.6 Undermining: Yes No No Classification: Grade 2 Grade 2 Unable to visualize wound bed Exudate Amount: Large Medium Large Exudate Type: Serosanguineous Serosanguineous Serous Exudate Color: red, brown red, brown amber Granulation Amount: Large (67-100%) Large (67-100%) None Present (0%) Granulation Quality: Red, Pink Red N/A Necrotic Amount: Small (1-33%) Small (1-33%) None Present (0%) Exposed Structures: Fat Layer (Subcutaneous Tissue): Fat Layer (Subcutaneous Tissue): Fat Layer (Subcutaneous Tissue): Yes Yes Yes Fascia: No Fascia: No Fascia: No Tendon: No Tendon: No Tendon: No Muscle: No Muscle: No Muscle: No Joint: No Joint:  No Joint: No Bone: No Bone: No Bone: No Epithelialization: None None Large (67-100%) Wound Number: 4 5 6  Photos: [name], [name] ([id number]) Wound Location: Right Toe Great Left Toe Second Left Toe Great Wounding Event: Gradually Appeared Gradually Appeared Gradually Appeared 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 Extremity Extremity Comorbid History: Cataracts, Lymphedema, Deep Vein Cataracts, Lymphedema, Deep Vein Cataracts, Lymphedema, Deep Vein Thrombosis, Hypertension, Type II Thrombosis, Hypertension, Type II Thrombosis, Hypertension, Type II Diabetes, Osteoarthritis, Diabetes, Osteoarthritis, Diabetes, Osteoarthritis, Osteomyelitis Osteomyelitis Osteomyelitis Date Acquired: 05/03/2021 05/03/2021 05/10/2021 Weeks of Treatment: 1 1 0 Wound Status: Open Open Open Clustered Wound: No No No Clustered Quantity: N/A N/A N/A Measurements L x W x D (cm) 0.5x0.3x0.2 0.4x0.3x0.2 0.5x0.2x0.1 Area (cm) : 0.118 0.094 0.079 Volume (cm) : 0.024 0.019 0.008 % Reduction in Area: -66.20% -19.00% 0.00% % Reduction in Volume: -71.40% -137.50% 0.00% Undermining: No No No Classification: Grade 2 Grade 1 Grade 2 Exudate Amount: Medium Medium Medium Exudate Type: Serosanguineous Serosanguineous Serous Exudate Color: red, brown red, brown amber Granulation Amount: Small (1-33%) Large (67-100%) Small (1-33%) Granulation Quality: Pink Red Red Necrotic Amount: Large (67-100%) None Present (0%) Large (67-100%) Exposed Structures: Fat Layer (Subcutaneous Tissue): Fat Layer (Subcutaneous Tissue): Fat Layer (Subcutaneous Tissue): Yes Yes Yes Fascia: No Fascia: No Fascia: No Tendon: No Tendon: No Tendon: No Muscle: No Muscle: No Muscle: No Joint: No Joint: No Joint: No Bone: No Bone: No Bone: No Epithelialization: None None None Wound Number: 7 N/A N/A Photos: N/A N/A Wound Location: Left, Dorsal  Foot N/A N/A Wounding Event: Gradually Appeared N/A N/A Primary Etiology: Diabetic Wound/Ulcer of the Lower N/A N/A Extremity Comorbid History: Cataracts, Lymphedema, Deep Vein N/A N/A Thrombosis, Hypertension, Type II Diabetes, Osteoarthritis, Osteomyelitis Date Acquired: 05/10/2021 N/A N/A Weeks of Treatment: 0 N/A N/A Wound Status: Open N/A N/A Clustered Wound: No N/A N/A Clustered Quantity: N/A N/A N/A Measurements L x W x D (cm) 0.1x0.1x0.1 N/A N/A Area (cm) : 0.008 N/A N/A Volume (cm) : 0.001 N/A N/A % Reduction in Area: N/A N/A N/A % Reduction in Volume: N/A N/A N/A Undermining: No N/A N/A Classification: Unable to visualize wound bed N/A N/A Exudate Amount: Medium N/A N/A [name], [name] ([id number]) Exudate Type: Serous N/A N/A Exudate Color: amber N/A N/A Granulation Amount: None Present (0%) N/A N/A Granulation Quality: N/A N/A N/A Necrotic Amount: Large (67-100%) N/A N/A Exposed Structures: Fat Layer (Subcutaneous Tissue): N/A N/A Yes Fascia: No Tendon: No Muscle: No Joint: No Bone: No Epithelialization: None N/A N/A Treatment Notes Electronic Signature(s) Signed: 05/10/2021 4:22:57 PM By: Dolan Amen RN Entered By: Dolan Amen on 05/10/2021 15:12:25 [name] ([id number]) -------------------------------------------------------------------------------- Multi-Disciplinary Care Plan Details Patient Name: [name] Date of Service: 05/10/2021 2:45 PM Medical Record Number: [id number] Patient Account Number: [id number] Date of  Birth/Sex: [birth year] (70 y.o. M) Treating RN: Dolan Amen Primary Care Sky Borboa: Beverlyn Roux Other Clinician: Referring Mckay Tegtmeyer: Beverlyn Roux Treating Esias Mory/Extender: Skipper Cliche in Treatment: 1 Active Inactive Abuse / Safety / Falls / Self Care Management Nursing Diagnoses: Potential for falls Potential for injury related to falls Goals: Patient will not experience any injury related to  falls Date Initiated: 05/03/2021 Target Resolution Date: 06/02/2021 Goal Status: Active Patient will remain injury free related to falls Date Initiated: 05/03/2021 Target Resolution Date: 06/02/2021 Goal Status: Active Patient/caregiver will verbalize understanding of skin care regimen Date Initiated: 05/03/2021 Target Resolution Date: 05/03/2021 Goal Status: Active Interventions: Call light and/or bell within patient's reach Podiatry chair, stretcher in low position and side rails up as needed Assess Activities of Daily Living upon admission and as needed Assess fall risk on admission and as needed Assess: immobility, friction, shearing, incontinence upon admission and as needed Assess impairment of mobility on admission and as needed per policy Notes: Necrotic Tissue Nursing Diagnoses: Impaired tissue integrity related to necrotic/devitalized tissue Knowledge deficit related to management of necrotic/devitalized tissue Goals: Necrotic/devitalized tissue will be minimized in the wound bed Date Initiated: 05/03/2021 Target Resolution Date: 05/03/2021 Goal Status: Active Patient/caregiver will verbalize understanding of reason and process for debridement of necrotic tissue Date Initiated: 05/03/2021 Target Resolution Date: 05/03/2021 Goal Status: Active Interventions: Assess patient pain level pre-, during and post procedure and prior to discharge Provide education on necrotic tissue and debridement process Treatment Activities: Apply topical anesthetic as ordered : 05/03/2021 Biologic debridement : 05/03/2021 Enzymatic debridement : 05/03/2021 Excisional debridement : 05/03/2021 Notes: JAYDAN, MEIDINGER ([id number]) Wound/Skin Impairment Nursing Diagnoses: Impaired tissue integrity Goals: Patient/caregiver will verbalize understanding of skin care regimen Date Initiated: 05/03/2021 Target Resolution Date: 05/03/2021 Goal Status: Active Ulcer/skin breakdown will have a volume reduction of 30% by  week 4 Date Initiated: 05/03/2021 Target Resolution Date: 06/02/2021 Goal Status: Active Ulcer/skin breakdown will have a volume reduction of 50% by week 8 Date Initiated: 05/03/2021 Target Resolution Date: 07/03/2021 Goal Status: Active Ulcer/skin breakdown will have a volume reduction of 80% by week 12 Date Initiated: 05/03/2021 Target Resolution Date: 08/02/2021 Goal Status: Active Ulcer/skin breakdown will heal within 14 weeks Date Initiated: 05/03/2021 Target Resolution Date: 09/02/2021 Goal Status: Active Interventions: Assess patient/caregiver ability to obtain necessary supplies Assess patient/caregiver ability to perform ulcer/skin care regimen upon admission and as needed Assess ulceration(s) every visit Provide education on ulcer and skin care Treatment Activities: Referred to DME Sheba Whaling for dressing supplies : 05/03/2021 Skin care regimen initiated : 05/03/2021 Notes: Electronic Signature(s) Signed: 05/10/2021 4:22:57 PM By: Dolan Amen RN Entered By: Dolan Amen on 05/10/2021 15:12:19 [name] ([id number]) -------------------------------------------------------------------------------- Pain Assessment Details Patient Name: [name] Date of Service: 05/10/2021 2:45 PM Medical Record Number: [id number] Patient Account Number: [id number] Date of Birth/Sex: 1951/04/30 (70 y.o. M) Treating RN: Dolan Amen Primary Care Atalya Dano: Beverlyn Roux Other Clinician: Referring Pieter Fooks: Beverlyn Roux Treating Mamta Rimmer/Extender: Skipper Cliche in Treatment: 1 Active Problems Location of Pain Severity and Description of Pain Patient Has Paino No Site Locations Rate the pain. Current Pain Level: 0 Pain Management and Medication Current Pain Management: Electronic Signature(s) Signed: 05/10/2021 4:22:57 PM By: Dolan Amen RN Entered By: Dolan Amen on 05/10/2021 14:42:56 [name]  ([id number]) -------------------------------------------------------------------------------- Patient/Caregiver Education Details Patient Name: [name] Date of Service: 05/10/2021 2:45 PM Medical Record Number: [id number] Patient Account Number: [id number] Date of Birth/Gender: 06-23-1951 (70 y.o. M) Treating RN: Laurin Coder,  Minus Breeding Primary Care Physician: Beverlyn Roux Other Clinician: Referring Physician: Beverlyn Roux Treating Physician/Extender: Skipper Cliche in Treatment: 1 Education Assessment Education Provided To: Patient Education Topics Provided Wound/Skin Impairment: Methods: Explain/Verbal Responses: State content correctly Electronic Signature(s) Signed: 05/10/2021 4:22:57 PM By: Dolan Amen RN Entered By: Dolan Amen on 05/10/2021 15:16:52 [name] ([id number]) -------------------------------------------------------------------------------- Wound Assessment Details Patient Name: [name] Date of Service: 05/10/2021 2:45 PM Medical Record Number: [id number] Patient Account Number: [id number] Date of Birth/Sex: October 13, 1950 (70 y.o. M) Treating RN: Dolan Amen Primary Care Yazmine Sorey: Beverlyn Roux Other Clinician: Referring Inri Sobieski: Beverlyn Roux Treating Chamaine Stankus/Extender: Skipper Cliche in Treatment: 1 Wound Status Wound Number: 1 Primary Diabetic Wound/Ulcer of the Lower Extremity Etiology: Wound Location: Right, Proximal, Lateral Foot Wound Open Wounding Event: Gradually Appeared Status: Date Acquired: 05/03/2020 Comorbid Cataracts, Lymphedema, Deep Vein Thrombosis, Weeks Of Treatment: 1 History: Hypertension, Type II Diabetes, Osteoarthritis, Clustered Wound: No Osteomyelitis Photos Wound Measurements Length: (cm) 1.8 % Red Width: (cm) 2 % Red Depth: (cm) 0.4 Epith Area: (cm) 2.827 Tunn Volume: (cm) 1.131 [address] En Ma uction in Area: 5.3% uction in Volume: 36.9% elialization: None eling: No rmining:  Yes arting Position (o'clock): 7 ding Position (o'clock): 11 ximum Distance: (cm) 0.6 Wound Description Classification: Grade 2 Foul Exudate Amount: Large Sloug Exudate Type: Serosanguineous Exudate Color: red, brown Odor After Cleansing: No h/Fibrino Yes Wound Bed Granulation Amount: Large (67-100%) Exposed Structure Granulation Quality: Red, Pink Fascia Exposed: No Necrotic Amount: Small (1-33%) Fat Layer (Subcutaneous Tissue) Exposed: Yes Necrotic Quality: Adherent Slough Tendon Exposed: No Muscle Exposed: No Joint Exposed: No Bone Exposed: No Treatment Notes Wound #1 (Foot) Wound Laterality: Right, Lateral, Proximal Cleanser Wound Cleanser [name], [name] ([id number]) Discharge Instruction: Wash your hands with soap and water. Remove old dressing, discard into plastic bag and place into trash. Cleanse the wound with Wound Cleanser prior to applying a clean dressing using gauze sponges, not tissues or cotton balls. Do not scrub or use excessive force. Pat dry using gauze sponges, not tissue or cotton balls. Peri-Wound Care Topical Primary Dressing Hydrofera Blue Ready Transfer Foam, 2.5x2.5 (in/in) Discharge Instruction: Apply Hydrofera Blue Ready to wound bed as directed Secondary Dressing ABD Pad 5x9 (in/in) Discharge Instruction: Cover with ABD pad Secured With Compression Wrap Profore Lite LF 3 Multilayer Compression [place] Discharge Instruction: Apply 3 multi-layer wrap as prescribed. Compression Stockings Add-Ons Electronic Signature(s) Signed: 05/10/2021 4:22:57 PM By: Dolan Amen RN Entered By: Dolan Amen on 05/10/2021 14:57:59 [name] ([id number]) -------------------------------------------------------------------------------- Wound Assessment Details Patient Name: [name] Date of Service: 05/10/2021 2:45 PM Medical Record Number: [id number] Patient Account Number: [id number] Date of Birth/Sex: [birth year] (70  y.o. M) Treating RN: Dolan Amen Primary Care Jarissa Sheriff: Beverlyn Roux Other Clinician: Referring Dany Walther: Beverlyn Roux Treating Torunn Chancellor/Extender: Skipper Cliche in Treatment: 1 Wound Status Wound Number: 2 Primary Diabetic Wound/Ulcer of the Lower Extremity Etiology: Wound Location: Right, Distal, Lateral Foot Wound Open Wounding Event: Gradually Appeared Status: Date Acquired: 05/03/2020 Comorbid Cataracts, Lymphedema, Deep Vein Thrombosis, Weeks Of Treatment: 1 History: Hypertension, Type II Diabetes, Osteoarthritis, Clustered Wound: No Osteomyelitis Photos Wound Measurements Length: (cm) 2.2 Width: (cm) 1.1 Depth: (cm) 0.1 Area: (cm) 1.901 Volume: (cm) 0.19 % Reduction in Area: 22.4% % Reduction in Volume: 61.2% Epithelialization: None Tunneling: No Undermining: No Wound Description Classification: Grade 2 Exudate Amount: Medium Exudate Type: Serosanguineous Exudate Color: red, brown Foul Odor After Cleansing: No Slough/Fibrino Yes Wound Bed Granulation Amount: Large (  67-100%) Exposed Structure Granulation Quality: Red Fascia Exposed: No Necrotic Amount: Small (1-33%) Fat Layer (Subcutaneous Tissue) Exposed: Yes Necrotic Quality: Adherent Slough Tendon Exposed: No Muscle Exposed: No Joint Exposed: No Bone Exposed: No Treatment Notes Wound #2 (Foot) Wound Laterality: Right, Lateral, Distal Cleanser [place] Ancillary Kit - 15 Day Supply Discharge Instruction: Use supplies as instructed; Kit contains: (15) Saline Bullets; (15) 3x3 Gauze; 15 pr Gloves Wound Cleanser BELL, CAI ([id number]) Discharge Instruction: Wash your hands with soap and water. Remove old dressing, discard into plastic bag and place into trash. Cleanse the wound with Wound Cleanser prior to applying a clean dressing using gauze sponges, not tissues or cotton balls. Do not scrub or use excessive force. Pat dry using gauze sponges, not tissue or cotton balls. Peri-Wound  Care Topical Primary Dressing Hydrofera Blue Ready Transfer Foam, 2.5x2.5 (in/in) Discharge Instruction: Apply Hydrofera Blue Ready to wound bed as directed Secondary Dressing ABD Pad 5x9 (in/in) Discharge Instruction: Cover with ABD pad Secured With Compression Wrap Profore Lite LF 3 Multilayer Compression [place] Discharge Instruction: Apply 3 multi-layer wrap as prescribed. Compression Stockings Add-Ons Electronic Signature(s) Signed: 05/10/2021 4:22:57 PM By: Dolan Amen RN Entered By: Dolan Amen on 05/10/2021 14:58:29 [name] ([id number]) -------------------------------------------------------------------------------- Wound Assessment Details Patient Name: [name] Date of Service: 05/10/2021 2:45 PM Medical Record Number: [id number] Patient Account Number: [id number] Date of Birth/Sex: Jan 08, 1951 (70 y.o. M) Treating RN: Dolan Amen Primary Care Azalie Harbeck: Beverlyn Roux Other Clinician: Referring Maryrose Colvin: Beverlyn Roux Treating Ariv Penrod/Extender: Skipper Cliche in Treatment: 1 Wound Status Wound Number: 3 Primary Diabetic Wound/Ulcer of the Lower Extremity Etiology: Wound Location: Right Toe Second Wound Open Wounding Event: Gradually Appeared Status: Date Acquired: 05/03/2021 Comorbid Cataracts, Lymphedema, Deep Vein Thrombosis, Weeks Of Treatment: 1 History: Hypertension, Type II Diabetes, Osteoarthritis, Clustered Wound: Yes Osteomyelitis Photos Wound Measurements Length: (cm) 2.5 Width: (cm) 0.1 Depth: (cm) 0.1 Clustered Quantity: 3 Area: (cm) 0.196 Volume: (cm) 0.02 % Reduction in Area: 91.7% % Reduction in Volume: 95.8% Epithelialization: Large (67-100%) Tunneling: No Undermining: No Wound Description Classification: Unable to visualize wound bed Exudate Amount: Large Exudate Type: Serous Exudate Color: amber Foul Odor After Cleansing: No Slough/Fibrino No Wound Bed Granulation Amount: None Present (0%)  Exposed Structure Necrotic Amount: None Present (0%) Fascia Exposed: No Fat Layer (Subcutaneous Tissue) Exposed: Yes Tendon Exposed: No Muscle Exposed: No Joint Exposed: No Bone Exposed: No Treatment Notes Wound #3 (Toe Second) Wound Laterality: Right Cleanser Wound Cleanser Discharge Instruction: Wash your hands with soap and water. Remove old dressing, discard into plastic bag and place into trash. Cleanse the wound with Wound Cleanser prior to applying a clean dressing using gauze sponges, not tissues or cotton balls. Do not [name], [name] ([id number]) scrub or use excessive force. Pat dry using gauze sponges, not tissue or cotton balls. Peri-Wound Care Topical Primary Dressing Hydrofera Blue Ready Transfer Foam, 2.5x2.5 (in/in) Discharge Instruction: Apply Hydrofera Blue Ready to wound bed as directed Secondary Dressing Coverlet Latex-Free Fabric Adhesive Dressings Discharge Instruction: 1.5 x 2 Secured With Compression Wrap Compression Stockings Add-Ons Electronic Signature(s) Signed: 05/10/2021 4:22:57 PM By: Dolan Amen RN Entered By: Dolan Amen on 05/10/2021 14:59:21 [name] ([id number]) -------------------------------------------------------------------------------- Wound Assessment Details Patient Name: [name] Date of Service: 05/10/2021 2:45 PM Medical Record Number: [id number] Patient Account Number: [id number] Date of Birth/Sex: [birth year] (70 y.o. M) Treating RN: Dolan Amen Primary Care Monika Chestang: Beverlyn Roux Other Clinician: Referring Otila Starn: Beverlyn Roux Treating Zayvian Mcmurtry/Extender: Joaquim Lai,  Hoyt Weeks in Treatment: 1 Wound Status Wound Number: 4 Primary Diabetic Wound/Ulcer of the Lower Extremity Etiology: Wound Location: Right Toe Great Wound Open Wounding Event: Gradually Appeared Status: Date Acquired: 05/03/2021 Comorbid Cataracts, Lymphedema, Deep Vein Thrombosis, Weeks Of Treatment: 1 History: Hypertension,  Type II Diabetes, Osteoarthritis, Clustered Wound: No Osteomyelitis Photos Wound Measurements Length: (cm) 0.5 Width: (cm) 0.3 Depth: (cm) 0.2 Area: (cm) 0.118 Volume: (cm) 0.024 % Reduction in Area: -66.2% % Reduction in Volume: -71.4% Epithelialization: None Tunneling: No Undermining: No Wound Description Classification: Grade 2 Exudate Amount: Medium Exudate Type: Serosanguineous Exudate Color: red, brown Foul Odor After Cleansing: No Slough/Fibrino Yes Wound Bed Granulation Amount: Small (1-33%) Exposed Structure Granulation Quality: Pink Fascia Exposed: No Necrotic Amount: Large (67-100%) Fat Layer (Subcutaneous Tissue) Exposed: Yes Necrotic Quality: Adherent Slough Tendon Exposed: No Muscle Exposed: No Joint Exposed: No Bone Exposed: No Treatment Notes Wound #4 (Toe Great) Wound Laterality: Right Cleanser Wound Cleanser Discharge Instruction: Wash your hands with soap and water. Remove old dressing, discard into plastic bag and place into trash. Cleanse the wound with Wound Cleanser prior to applying a clean dressing using gauze sponges, not tissues or cotton balls. Do not scrub or use excessive force. Pat dry using gauze sponges, not tissue or cotton balls. [name], [name] Jenetta Downer ([id number]) Peri-Wound Care Topical Primary Dressing Hydrofera Blue Ready Transfer Foam, 2.5x2.5 (in/in) Discharge Instruction: Apply Hydrofera Blue Ready to wound bed as directed Secondary Dressing Coverlet Latex-Free Fabric Adhesive Dressings Discharge Instruction: 1.5 x 2 Secured With Compression Wrap Compression Stockings Add-Ons Electronic Signature(s) Signed: 05/10/2021 4:22:57 PM By: Dolan Amen RN Entered By: Dolan Amen on 05/10/2021 15:00:48 [name] ([id number]) -------------------------------------------------------------------------------- Wound Assessment Details Patient Name: [name] Date of Service: 05/10/2021 2:45 PM Medical Record  Number: [id number] Patient Account Number: [id number] Date of Birth/Sex: 04/29/1951 (70 y.o. M) Treating RN: Dolan Amen Primary Care Natilee Gauer: Beverlyn Roux Other Clinician: Referring Junko Ohagan: Beverlyn Roux Treating Anavey Coombes/Extender: Skipper Cliche in Treatment: 1 Wound Status Wound Number: 5 Primary Diabetic Wound/Ulcer of the Lower Extremity Etiology: Wound Location: Left Toe Second Wound Open Wounding Event: Gradually Appeared Status: Date Acquired: 05/03/2021 Comorbid Cataracts, Lymphedema, Deep Vein Thrombosis, Weeks Of Treatment: 1 History: Hypertension, Type II Diabetes, Osteoarthritis, Clustered Wound: No Osteomyelitis Photos Wound Measurements Length: (cm) 0.4 Width: (cm) 0.3 Depth: (cm) 0.2 Area: (cm) 0.094 Volume: (cm) 0.019 % Reduction in Area: -19% % Reduction in Volume: -137.5% Epithelialization: None Tunneling: No Undermining: No Wound Description Classification: Grade 1 Exudate Amount: Medium Exudate Type: Serosanguineous Exudate Color: red, brown Foul Odor After Cleansing: No Slough/Fibrino No Wound Bed Granulation Amount: Large (67-100%) Exposed Structure Granulation Quality: Red Fascia Exposed: No Necrotic Amount: None Present (0%) Fat Layer (Subcutaneous Tissue) Exposed: Yes Tendon Exposed: No Muscle Exposed: No Joint Exposed: No Bone Exposed: No Treatment Notes Wound #5 (Toe Second) Wound Laterality: Left Cleanser Wound Cleanser Discharge Instruction: Wash your hands with soap and water. Remove old dressing, discard into plastic bag and place into trash. Cleanse the wound with Wound Cleanser prior to applying a clean dressing using gauze sponges, not tissues or cotton balls. Do not scrub or use excessive force. Pat dry using gauze sponges, not tissue or cotton balls. [name], [name] Jenetta Downer ([id number]) Peri-Wound Care Topical Primary Dressing Hydrofera Blue Ready Transfer Foam, 2.5x2.5 (in/in) Discharge Instruction: Apply Hydrofera  Blue Ready to wound bed as directed Secondary Dressing Coverlet Latex-Free Fabric Adhesive Dressings Discharge Instruction: 1.5 x 2 Secured With Compression Wrap Compression Stockings Add-Ons Electronic  Signature(s) Signed: 05/10/2021 4:22:57 PM By: Dolan Amen RN Entered By: Dolan Amen on 05/10/2021 15:00:32 [name] ([id number]) -------------------------------------------------------------------------------- Wound Assessment Details Patient Name: [name] Date of Service: 05/10/2021 2:45 PM Medical Record Number: [id number] Patient Account Number: [id number] Date of Birth/Sex: 1950/09/29 (70 y.o. M) Treating RN: Dolan Amen Primary Care Lang Zingg: Beverlyn Roux Other Clinician: Referring Alyvia Derk: Beverlyn Roux Treating Milda Lindvall/Extender: Skipper Cliche in Treatment: 1 Wound Status Wound Number: 6 Primary Diabetic Wound/Ulcer of the Lower Extremity Etiology: Wound Location: Left Toe Great Wound Open Wounding Event: Gradually Appeared Status: Date Acquired: 05/10/2021 Comorbid Cataracts, Lymphedema, Deep Vein Thrombosis, Weeks Of Treatment: 0 History: Hypertension, Type II Diabetes, Osteoarthritis, Clustered Wound: No Osteomyelitis Photos Wound Measurements Length: (cm) 0.5 Width: (cm) 0.2 Depth: (cm) 0.1 Area: (cm) 0.079 Volume: (cm) 0.008 % Reduction in Area: 0% % Reduction in Volume: 0% Epithelialization: None Tunneling: No Undermining: No Wound Description Classification: Grade 2 Exudate Amount: Medium Exudate Type: Serous Exudate Color: amber Foul Odor After Cleansing: No Slough/Fibrino Yes Wound Bed Granulation Amount: Small (1-33%) Exposed Structure Granulation Quality: Red Fascia Exposed: No Necrotic Amount: Large (67-100%) Fat Layer (Subcutaneous Tissue) Exposed: Yes Necrotic Quality: Adherent Slough Tendon Exposed: No Muscle Exposed: No Joint Exposed: No Bone Exposed: No Treatment Notes Wound #6 (Toe Great)  Wound Laterality: Left Cleanser Wound Cleanser Discharge Instruction: Wash your hands with soap and water. Remove old dressing, discard into plastic bag and place into trash. Cleanse the wound with Wound Cleanser prior to applying a clean dressing using gauze sponges, not tissues or cotton balls. Do not scrub or use excessive force. Pat dry using gauze sponges, not tissue or cotton balls. [name], [name] Jenetta Downer ([id number]) Peri-Wound Care Topical Primary Dressing Hydrofera Blue Ready Transfer Foam, 2.5x2.5 (in/in) Discharge Instruction: Apply Hydrofera Blue Ready to wound bed as directed Secondary Dressing Coverlet Latex-Free Fabric Adhesive Dressings Discharge Instruction: 1.5 x 2 Secured With Compression Wrap Compression Stockings Add-Ons Electronic Signature(s) Signed: 05/10/2021 4:22:57 PM By: Dolan Amen RN Entered By: Dolan Amen on 05/10/2021 15:06:04 [name] ([id number]) -------------------------------------------------------------------------------- Wound Assessment Details Patient Name: [name] Date of Service: 05/10/2021 2:45 PM Medical Record Number: [id number] Patient Account Number: [id number] Date of Birth/Sex: 1950-09-05 (70 y.o. M) Treating RN: Dolan Amen Primary Care Jaquasha Carnevale: Beverlyn Roux Other Clinician: Referring Amantha Sklar: Beverlyn Roux Treating Kenley Troop/Extender: Skipper Cliche in Treatment: 1 Wound Status Wound Number: 7 Primary Diabetic Wound/Ulcer of the Lower Extremity Etiology: Wound Location: Left, Dorsal Foot Wound Open Wounding Event: Gradually Appeared Status: Date Acquired: 05/10/2021 Comorbid Cataracts, Lymphedema, Deep Vein Thrombosis, Weeks Of Treatment: 0 History: Hypertension, Type II Diabetes, Osteoarthritis, Clustered Wound: No Osteomyelitis Photos Wound Measurements Length: (cm) 0.1 Width: (cm) 0.1 Depth: (cm) 0.1 Area: (cm) 0.008 Volume: (cm) 0.001 % Reduction in Area: % Reduction in  Volume: Epithelialization: None Tunneling: No Undermining: No Wound Description Classification: Unable to visualize wound bed Exudate Amount: Medium Exudate Type: Serous Exudate Color: amber Foul Odor After Cleansing: No Slough/Fibrino Yes Wound Bed Granulation Amount: None Present (0%) Exposed Structure Necrotic Amount: Large (67-100%) Fascia Exposed: No Necrotic Quality: Adherent Slough Fat Layer (Subcutaneous Tissue) Exposed: Yes Tendon Exposed: No Muscle Exposed: No Joint Exposed: No Bone Exposed: No Treatment Notes Wound #7 (Foot) Wound Laterality: Dorsal, Left Cleanser Wound Cleanser Discharge Instruction: Wash your hands with soap and water. Remove old dressing, discard into plastic bag and place into trash. Cleanse the wound with Wound Cleanser prior to applying a clean dressing using gauze sponges,  not tissues or cotton balls. Do not scrub or use excessive force. Pat dry using gauze sponges, not tissue or cotton balls. [name], [name] Jenetta Downer ([id number]) Peri-Wound Care Topical Primary Dressing Hydrofera Blue Ready Transfer Foam, 2.5x2.5 (in/in) Discharge Instruction: Apply Hydrofera Blue Ready to wound bed as directed Secondary Dressing Coverlet Latex-Free Fabric Adhesive Dressings Discharge Instruction: 1.5 x 2 Secured With Compression Wrap Compression Stockings Add-Ons Electronic Signature(s) Signed: 05/10/2021 4:22:57 PM By: Dolan Amen RN Entered By: Dolan Amen on 05/10/2021 15:05:28 [name] ([id number]) -------------------------------------------------------------------------------- Vitals Details Patient Name: [name] Date of Service: 05/10/2021 2:45 PM Medical Record Number: [id number] Patient Account Number: [id number] Date of Birth/Sex: [birth year] (70 y.o. M) Treating RN: Dolan Amen Primary Care Madysin Crisp: Beverlyn Roux Other Clinician: Referring Haniah Penny: Beverlyn Roux Treating Kasiah Manka/Extender: Skipper Cliche in  Treatment: 1 Vital Signs Time Taken: 14:41 Temperature (F): 97.9 Height (in): 71 Pulse (bpm): 76 Weight (lbs): 255 Respiratory Rate (breaths/min): 18 Body Mass Index (BMI): 35.6 Blood Pressure (mmHg): 146/74 Reference Range: 80 - 120 mg / dl Electronic Signature(s) Signed: 05/10/2021 4:22:57 PM By: Dolan Amen RN Entered By: Dolan Amen on 05/10/2021 14:42:49

## 2021-05-17 ENCOUNTER — Encounter: Payer: Medicare HMO | Admitting: Physician Assistant

## 2021-05-17 ENCOUNTER — Other Ambulatory Visit: Payer: Self-pay

## 2021-05-17 DIAGNOSIS — E11621 Type 2 diabetes mellitus with foot ulcer: Secondary | ICD-10-CM | POA: Diagnosis not present

## 2021-05-17 NOTE — Progress Notes (Addendum)
CLEMONS, SALVUCCI ([id number]) Visit Report for 05/17/2021 Arrival Information Details Patient Name: [name], [name] Date of Service: 05/17/2021 8:15 AM Medical Record Number: [id number] Patient Account Number: [id number] Date of Birth/Sex: 20-Aug-1951 (70 y.o. M) Treating RN: Cornell Barman Primary Care Yarisa Lynam: Beverlyn Roux Other Clinician: Referring Iverna Hammac: Beverlyn Roux Treating Tehillah Cipriani/Extender: Skipper Cliche in Treatment: 2 Visit Information History Since Last Visit Added or deleted any medications: No Patient Arrived: Walker Has Compression in Place as Prescribed: Yes Arrival Time: 08:17 Has Footwear/Offloading in Place as Prescribed: Yes Accompanied By: self Left: Other:surgical shoe Transfer Assistance: None Pain Present Now: No Patient Identification Verified: Yes Secondary Verification Process Completed: Yes Electronic Signature(s) Signed: 05/20/2021 4:05:19 PM By: Gretta Cool, BSN, RN, CWS, Kim RN, BSN Entered By: Gretta Cool, BSN, RN, CWS, Kim on 05/17/2021 08:18:06 [name] ([id number]) -------------------------------------------------------------------------------- Clinic Level of Care Assessment Details Patient Name: [name] Date of Service: 05/17/2021 8:15 AM Medical Record Number: [id number] Patient Account Number: [id number] Date of Birth/Sex: September 19, 1950 (70 y.o. M) Treating RN: Dolan Amen Primary Care Hanah Moultry: Beverlyn Roux Other Clinician: Referring Jacorian Golaszewski: Beverlyn Roux Treating Shanea Karney/Extender: Skipper Cliche in Treatment: 2 Clinic Level of Care Assessment Items TOOL 1 Quantity Score _0  - Use when EandM and Procedure is performed on INITIAL visit 0 ASSESSMENTS - Nursing Assessment / Reassessment _1  - General Physical Exam (combine w/ comprehensive assessment (listed just below) when performed on new 0 pt. evals) _2  - 0 Comprehensive Assessment (HX, ROS, Risk Assessments, Wounds Hx, etc.) ASSESSMENTS - Wound and Skin  Assessment / Reassessment _3  - Dermatologic / Skin Assessment (not related to wound area) 0 ASSESSMENTS - Ostomy and/or Continence Assessment and Care _4  - Incontinence Assessment and Management 0 _5  - 0 Ostomy Care Assessment and Management (repouching, etc.) PROCESS - Coordination of Care _6  - Simple Patient / Family Education for ongoing care 0 _7  - 0 Complex (extensive) Patient / Family Education for ongoing care _8  - 0 Staff obtains Programmer, systems, Records, Test Results / Process Orders _9  - 0 Staff telephones HHA, Nursing [name] / Clarify orders / etc _10  - 0 Routine Transfer to another Facility (non-emergent condition) _11  - 0 Routine Hospital Admission (non-emergent condition) _12  - 0 New Admissions / Biomedical engineer / Ordering NPWT, Apligraf, etc. _13  - 0 Emergency Hospital Admission (emergent condition) PROCESS - Special Needs _14  - Pediatric / Minor Patient Management 0 _15  - 0 Isolation Patient Management _16  - 0 Hearing / Language / Visual special needs _17  - 0 Assessment of Community assistance (transportation, D/C planning, etc.) _18  - 0 Additional assistance / Altered mentation _19  - 0 Support Surface(s) Assessment (bed, cushion, seat, etc.) INTERVENTIONS - Miscellaneous _20  - External ear exam 0 _21  - 0 Patient Transfer (multiple staff / Civil Service fast streamer / Similar devices) _22  - 0 Simple Staple / Suture removal (25 or less) _23  - 0 Complex Staple / Suture removal (26 or more) _24  - 0 Hypo/Hyperglycemic Management (do not check if billed separately) _25  - 0 Ankle / Brachial Index (ABI) - do not check if billed separately Has the patient been seen at the hospital within the last three years: Yes Total Score: 0 Level Of Care: ____ [name] ([id number]) Electronic Signature(s) Signed: 05/17/2021 4:40:04 PM By: Dolan Amen RN Entered By: Dolan Amen on 05/17/2021 09:01:15 [name]  ([id number]) -------------------------------------------------------------------------------- Encounter Discharge Information Details Patient Name: [name] Date of Service: 05/17/2021 8:15 AM Medical Record Number: [id number] Patient Account Number: [id number] Date of Birth/Sex: [birth year] (  70 y.o. M) Treating RN: Dolan Amen Primary Care Shelisa Fern: Beverlyn Roux Other Clinician: Referring Melicia Esqueda: Beverlyn Roux Treating Jaidence Geisler/Extender: Skipper Cliche in Treatment: 2 Encounter Discharge Information Items Post Procedure Vitals Discharge Condition: Stable Temperature (F): 97.8 Ambulatory Status: Walker Pulse (bpm): 65 Discharge Destination: Home Respiratory Rate (breaths/min): 16 Transportation: Private Auto Blood Pressure (mmHg): 117/62 Accompanied By: self Schedule Follow-up Appointment: Yes Clinical Summary of Care: Electronic Signature(s) Signed: 05/17/2021 9:39:52 AM By: Dolan Amen RN Entered By: Dolan Amen on 05/17/2021 09:39:52 [name] ([id number]) -------------------------------------------------------------------------------- Lower Extremity Assessment Details Patient Name: [name] Date of Service: 05/17/2021 8:15 AM Medical Record Number: [id number] Patient Account Number: [id number] Date of Birth/Sex: 05-Sep-1950 (71 y.o. M) Treating RN: Cornell Barman Primary Care Claritza July: Beverlyn Roux Other Clinician: Referring Sharesa Kemp: Beverlyn Roux Treating Tashyra Adduci/Extender: Skipper Cliche in Treatment: 2 Edema Assessment Assessed: [Left: No] [Right: Yes] Edema: [Left: Ye] [Right: s] Calf Left: Right: Point of Measurement: 31 cm From Medial Instep 41.5 cm Ankle Left: Right: Point of Measurement: 10 cm From Medial Instep 26 cm Vascular Assessment Pulses: Dorsalis Pedis Palpable: [Left:Yes] [Right:Yes] Electronic Signature(s) Signed: 05/20/2021 4:05:19 PM By: Gretta Cool, BSN, RN, CWS, Kim RN, BSN Entered By: Gretta Cool, BSN, RN, CWS,  Kim on 05/17/2021 08:47:07 [name] ([id number]) -------------------------------------------------------------------------------- Multi Wound Chart Details Patient Name: [name] Date of Service: 05/17/2021 8:15 AM Medical Record Number: [id number] Patient Account Number: [id number] Date of Birth/Sex: 1951/08/30 (70 y.o. 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 Extremity Extremity Comorbid History: Cataracts, Lymphedema, Deep Vein Cataracts, Lymphedema, Deep Vein Cataracts, Lymphedema, Deep Vein Thrombosis, Hypertension, Type II Thrombosis, Hypertension, Type II Thrombosis, Hypertension, Type II Diabetes, Osteoarthritis, Diabetes, Osteoarthritis, Diabetes, Osteoarthritis, Osteomyelitis Osteomyelitis Osteomyelitis Date Acquired: 05/03/2020 05/03/2020 05/03/2021 Weeks of Treatment: _0 Wound Status: Open Open Healed - Epithelialized Clustered Wound: No No Yes Clustered Quantity: N/A N/A 3 Measurements L x W x D (cm) 1.5x2x0.4 1.6x1.4x0.1 0x0x0 Area (cm) : 2.356 1.759 0 Volume (cm) : 0.942 0.176 0 % Reduction in Area: 21.10% 28.20% 100.00% % Reduction in Volume: 47.40% 64.10% 100.00% Starting Position 1 (o'clock): 7 Ending Position 1 (o'clock): 11 Maximum Distance 1 (cm): 0.4 Undermining: Yes No No Classification: Grade 2 Grade 2 Unable to  visualize wound bed Exudate Amount: Medium Medium None Present Exudate Type: Serosanguineous Serosanguineous N/A Exudate Color: red, brown red, brown N/A Granulation Amount: Large (67-100%) Large (67-100%) None Present (0%) Granulation Quality: Red, Pink Red N/A Necrotic Amount: Small (1-33%) None Present (0%) None Present (0%) Exposed Structures: Fat Layer (Subcutaneous Tissue): Fat Layer (Subcutaneous Tissue): Fascia: No Yes Yes Fat Layer (Subcutaneous Tissue): Fascia: No Fascia: No No [name], [name] ([id number]) Tendon: No Tendon: No Tendon: No Muscle: No Muscle: No Muscle: No Joint: No Joint: No Joint: No Bone: No Bone: No Bone: No Epithelialization: None None Large (67-100%) Wound Number: _1 Photos: No Photos No Photos No Photos Wound Location: Right Toe Great Left Toe Second Left Toe Great Wounding Event: Gradually Appeared Gradually Appeared Gradually Appeared Primary  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 Extremity Extremity Comorbid History: Cataracts, Lymphedema, Deep Vein Cataracts, Lymphedema, Deep Vein Cataracts, Lymphedema, Deep Vein Thrombosis, Hypertension, Type II Thrombosis, Hypertension, Type II Thrombosis, Hypertension, Type II Diabetes, Osteoarthritis, Diabetes, Osteoarthritis, Diabetes, Osteoarthritis, Osteomyelitis Osteomyelitis Osteomyelitis Date Acquired: 05/03/2021 05/03/2021 05/10/2021 Weeks of Treatment: _0 Wound Status: Open Healed - Epithelialized Healed - Epithelialized Clustered Wound: No No No Clustered Quantity: N/A N/A N/A Measurements L x W x D (cm) 0x0x0 0x0x0 0x0x0 Area (cm) : 0 0 0 Volume (cm) : 0 0 0 % Reduction in Area: 100.00% 100.00% 100.00% % Reduction in Volume: 100.00% 100.00% 100.00% Undermining: No No No Classification: Grade 2 Grade 1 Grade 2 Exudate Amount: None Present None Present None Present Exudate Type: N/A N/A  N/A Exudate Color: N/A N/A N/A Granulation Amount: None Present (0%) None Present (0%) None Present (0%) Granulation Quality: N/A N/A N/A Necrotic Amount: None Present (0%) None Present (0%) None Present (0%) Exposed Structures: Fascia: No Fascia: No Fascia: No Fat Layer (Subcutaneous Tissue): Fat Layer (Subcutaneous Tissue): Fat Layer (Subcutaneous Tissue): No No No Tendon: No Tendon: No Tendon: No Muscle: No Muscle: No Muscle: No Joint: No Joint: No Joint: No Bone: No Bone: No Bone: No Epithelialization: Large (67-100%) Large (67-100%) Large (67-100%) Wound Number: 7 N/A N/A Photos: No Photos N/A N/A Wound Location: Left, Dorsal Foot N/A N/A Wounding Event: Gradually Appeared N/A N/A Primary Etiology: Diabetic Wound/Ulcer of the Lower N/A N/A Extremity Comorbid History: N/A N/A N/A Date Acquired: 05/10/2021 N/A N/A Weeks of Treatment: 1 N/A N/A Wound Status: Open N/A N/A Clustered Wound: No N/A N/A Clustered Quantity: N/A N/A N/A Measurements L x W x D (cm) 0.1x0.1x0.1 N/A N/A Area (cm) : 0.008 N/A N/A Volume (cm) : 0.001 N/A N/A % Reduction in Area: 0.00% N/A N/A % Reduction in Volume: 0.00% N/A N/A Undermining: N/A N/A N/A Classification: Unable to visualize wound bed N/A N/A Exudate Amount: Medium N/A N/A Exudate Type: Serous N/A N/A Exudate Color: amber N/A N/A Granulation Amount: N/A N/A N/A Granulation Quality: N/A N/A N/A Necrotic Amount: N/A N/A N/A Exposed Structures: N/A N/A N/A Epithelialization: N/A N/A N/A Treatment Notes [name], [name] ([id number]) Electronic Signature(s) Signed: 05/17/2021 4:40:04 PM By: Dolan Amen RN Entered By: Dolan Amen on 05/17/2021 08:59:07 [name] ([id number]) -------------------------------------------------------------------------------- [place] Plan Details Patient Name: [name] Date of Service: 05/17/2021 8:15 AM Medical Record Number: [id number] Patient  Account Number: [id number] Date of Birth/Sex: Dec 11, 1950 (70 y.o. M) Treating RN: Dolan Amen Primary Care Jenilee Franey: Beverlyn Roux Other Clinician: Referring Majour Frei: Beverlyn Roux Treating Erdem Naas/Extender: Skipper Cliche in Treatment: 2 Active Inactive Abuse / Safety / Falls / Self Care Management Nursing Diagnoses: Potential for falls Potential for injury related to falls Goals: Patient will not experience any injury related to falls Date Initiated: 05/03/2021 Target Resolution Date: 06/02/2021 Goal Status: Active Patient will remain injury free related to falls Date Initiated: 05/03/2021 Target Resolution Date: 06/02/2021 Goal Status: Active Patient/caregiver will verbalize understanding of skin care regimen Date Initiated: 05/03/2021 Target Resolution Date: 05/03/2021 Goal Status: Active Interventions: Call light and/or bell within patient's reach Podiatry chair, stretcher in low position and side rails up as needed Assess Activities of Daily Living upon admission and as needed Assess fall risk on admission and as needed Assess: immobility, friction, shearing, incontinence upon admission and as needed Assess impairment of mobility on admission and as  needed per policy Notes: Necrotic Tissue Nursing Diagnoses: Impaired tissue integrity related to necrotic/devitalized tissue Knowledge deficit related to management of necrotic/devitalized tissue Goals: Necrotic/devitalized tissue will be minimized in the wound bed Date Initiated: 05/03/2021 Target Resolution Date: 05/03/2021 Goal Status: Active Patient/caregiver will verbalize understanding of reason and process for debridement of necrotic tissue Date Initiated: 05/03/2021 Target Resolution Date: 05/03/2021 Goal Status: Active Interventions: Assess patient pain level pre-, during and post procedure and prior to discharge Provide education on necrotic tissue and debridement process Treatment Activities: Apply topical anesthetic  as ordered : 05/03/2021 Biologic debridement : 05/03/2021 Enzymatic debridement : 05/03/2021 Excisional debridement : 05/03/2021 Notes: [name], [name] ([id number]) Wound/Skin Impairment Nursing Diagnoses: Impaired tissue integrity Goals: Patient/caregiver will verbalize understanding of skin care regimen Date Initiated: 05/03/2021 Target Resolution Date: 05/03/2021 Goal Status: Active Ulcer/skin breakdown will have a volume reduction of 30% by week 4 Date Initiated: 05/03/2021 Target Resolution Date: 06/02/2021 Goal Status: Active Ulcer/skin breakdown will have a volume reduction of 50% by week 8 Date Initiated: 05/03/2021 Target Resolution Date: 07/03/2021 Goal Status: Active Ulcer/skin breakdown will have a volume reduction of 80% by week 12 Date Initiated: 05/03/2021 Target Resolution Date: 08/02/2021 Goal Status: Active Ulcer/skin breakdown will heal within 14 weeks Date Initiated: 05/03/2021 Target Resolution Date: 09/02/2021 Goal Status: Active Interventions: Assess patient/caregiver ability to obtain necessary supplies Assess patient/caregiver ability to perform ulcer/skin care regimen upon admission and as needed Assess ulceration(s) every visit Provide education on ulcer and skin care Treatment Activities: Referred to DME [facility] for dressing supplies : 05/03/2021 Skin care regimen initiated : 05/03/2021 Notes: Electronic Signature(s) Signed: 05/17/2021 4:40:04 PM By: Dolan Amen RN Entered By: Dolan Amen on 05/17/2021 08:58:59 [name] ([id number]) -------------------------------------------------------------------------------- Pain Assessment Details Patient Name: [name] Date of Service: 05/17/2021 8:15 AM Medical Record Number: [id number] Patient Account Number: [id number] Date of Birth/Sex: 12/09/1950 (70 y.o. M) Treating RN: Cornell Barman Primary Care Khori Underberg: Beverlyn Roux Other Clinician: Referring Shantil Vallejo: Beverlyn Roux Treating  Brianah Hopson/Extender: Skipper Cliche in Treatment: 2 Active Problems Location of Pain Severity and Description of Pain Patient Has Paino No Site Locations Pain Management and Medication Current Pain Management: Notes Patient denies pain at this time. Electronic Signature(s) Signed: 05/20/2021 4:05:19 PM By: Gretta Cool, BSN, RN, CWS, Kim RN, BSN Entered By: Gretta Cool, BSN, RN, CWS, Kim on 05/17/2021 08:18:43 [name] ([id number]) -------------------------------------------------------------------------------- Patient/Caregiver Education Details Patient Name: [name], [name] Date of Service: 05/17/2021 8:15 AM Medical Record Number: [id number] Patient Account Number: [id number] Date of Birth/Gender: June 01, 1951 (70 y.o. M) Treating RN: Dolan Amen Primary Care Physician: Beverlyn Roux Other Clinician: Referring Physician: Beverlyn Roux Treating Physician/Extender: Skipper Cliche in Treatment: 2 Education Assessment Education Provided To: Patient Education Topics Provided Welcome To [facility]: Methods: Explain/Verbal Responses: State content correctly Wound/Skin Impairment: Methods: Explain/Verbal Responses: State content correctly Electronic Signature(s) Signed: 05/17/2021 4:40:04 PM By: Dolan Amen RN Entered By: Dolan Amen on 05/17/2021 09:36:53 [name] ([id number]) -------------------------------------------------------------------------------- Wound Assessment Details Patient Name: [name] Date of Service: 05/17/2021 8:15 AM Medical Record Number: [id number] Patient Account Number: [id number] Date of Birth/Sex: [birth year] (70 y.o. M) Treating RN: Cornell Barman Primary Care Shaylea Ucci: Beverlyn Roux Other Clinician: Referring Zaria Taha: Beverlyn Roux Treating Avy Barlett/Extender: Skipper Cliche in Treatment: 2 Wound Status Wound Number: 1 Primary Diabetic Wound/Ulcer of the Lower Extremity Etiology: Wound Location: Right,  Proximal, Lateral Foot Wound Open Wounding Event: Gradually Appeared Status: Date Acquired: 05/03/2020 Comorbid Cataracts, Lymphedema,  Deep Vein Thrombosis, Weeks Of Treatment: 2 History: Hypertension, Type II Diabetes, Osteoarthritis, Clustered Wound: No Osteomyelitis Photos Wound Measurements Length: (cm) 1.5 % Red Width: (cm) 2 % Red Depth: (cm) 0.4 Epith Area: (cm) 2.356 Tunn Volume: (cm) 0.942 Un[address] En Ma uction in Area: 21.1% uction in Volume: 47.4% elialization: None eling: No rmining: Yes arting Position (o'clock): 7 ding Position (o'clock): 11 ximum Distance: (cm) 0.4 Wound Description Classification: Grade 2 Foul Exudate Amount: Medium Slou Exudate Type: Serosanguineous Exudate Color: red, brown Odor After Cleansing: No gh/Fibrino Yes Wound Bed Granulation Amount: Large (67-100%) Exposed Structure Granulation Quality: Red, Pink Fascia Exposed: No [name], [name] ([id number]) Necrotic Amount: Small (1-33%) Fat Layer (Subcutaneous Tissue) Exposed: Yes Necrotic Quality: Adherent Slough Tendon Exposed: No Muscle Exposed: No Joint Exposed: No Bone Exposed: No Treatment Notes Wound #1 (Foot) Wound Laterality: Right, Lateral, Proximal Cleanser Wound Cleanser Discharge Instruction: Wash your hands with soap and water. Remove old dressing, discard into plastic bag and place into trash. Cleanse the wound with Wound Cleanser prior to applying a clean dressing using gauze sponges, not tissues or cotton balls. Do not scrub or use excessive force. Pat dry using gauze sponges, not tissue or cotton balls. Peri-Wound Care Topical Primary Dressing Hydrofera Blue Ready Transfer Foam, 2.5x2.5 (in/in) Discharge Instruction: Apply Hydrofera Blue Ready to wound bed as directed Secondary Dressing ABD Pad 5x9 (in/in) Discharge Instruction: Cover with ABD pad Secured With Compression Wrap Profore Lite LF 3 Multilayer Compression [place] Discharge  Instruction: Apply 3 multi-layer wrap as prescribed. Compression Stockings Add-Ons Electronic Signature(s) Signed: 05/17/2021 4:40:04 PM By: Dolan Amen RN Signed: 05/20/2021 4:05:19 PM By: Gretta Cool, BSN, RN, CWS, Kim RN, BSN Entered By: Dolan Amen on 05/17/2021 09:05:11 [name] ([id number]) -------------------------------------------------------------------------------- Wound Assessment Details Patient Name: [name], [name] Date of Service: 05/17/2021 8:15 AM Medical Record Number: [id number] Patient Account Number: [id number] Date of Birth/Sex: 1950-12-21 (70 y.o. M) Treating RN: Cornell Barman Primary Care Darianne Muralles: Beverlyn Roux Other Clinician: Referring Elio Haden: Beverlyn Roux Treating Kosisochukwu Goldberg/Extender: Skipper Cliche in Treatment: 2 Wound Status Wound Number: 2 Primary Diabetic Wound/Ulcer of the Lower Extremity Etiology: Wound Location: Right, Distal, Lateral Foot Wound Open Wounding Event: Gradually Appeared Status: Date Acquired: 05/03/2020 Comorbid Cataracts, Lymphedema, Deep Vein Thrombosis, Weeks Of Treatment: 2 History: Hypertension, Type II Diabetes, Osteoarthritis, Clustered Wound: No Osteomyelitis Photos Wound Measurements Length: (cm) 1.6 % R Width: (cm) 1.4 % R Depth: (cm) 0.1 Epi Area: (cm) 1.759 Tu Volume: (cm) 0.176 Un eduction in Area: 28.2% eduction in Volume: 64.1% thelialization: None nneling: No dermining: No Wound Description Classification: Grade 2 Fo Exudate Amount: Medium Sl Exudate Type: Serosanguineous Exudate Color: red, brown ul Odor After Cleansing: No ough/Fibrino Yes Wound Bed Granulation Amount: Large (67-100%) Exposed Structure Granulation Quality: Red Fascia Exposed: No Necrotic Amount: None Present (0%) Fat Layer (Subcutaneous Tissue) Exposed: Yes Tendon Exposed: No Muscle Exposed: No Joint Exposed: No [name], [name] ([id number]) Bone Exposed: No Treatment Notes Wound #2 (Foot) Wound  Laterality: Right, Lateral, Distal Cleanser [place] Ancillary Kit - 15 Day Supply Discharge Instruction: Use supplies as instructed; Kit contains: (15) Saline Bullets; (15) 3x3 Gauze; 15 pr Gloves Wound Cleanser Discharge Instruction: Wash your hands with soap and water. Remove old dressing, discard into plastic bag and place into trash. Cleanse the wound with Wound Cleanser prior to applying a clean dressing using gauze sponges, not tissues or cotton balls. Do not scrub or use excessive force. Pat dry using gauze sponges, not  tissue or cotton balls. Peri-Wound Care Topical Primary Dressing Hydrofera Blue Ready Transfer Foam, 2.5x2.5 (in/in) Discharge Instruction: Apply Hydrofera Blue Ready to wound bed as directed Secondary Dressing ABD Pad 5x9 (in/in) Discharge Instruction: Cover with ABD pad Secured With Compression Wrap Profore Lite LF 3 Multilayer Compression [place] Discharge Instruction: Apply 3 multi-layer wrap as prescribed. Compression Stockings Add-Ons Electronic Signature(s) Signed: 05/17/2021 4:40:04 PM By: Dolan Amen RN Signed: 05/20/2021 4:05:19 PM By: Gretta Cool, BSN, RN, CWS, Kim RN, BSN Entered By: Dolan Amen on 05/17/2021 09:05:46 [name] ([id number]) -------------------------------------------------------------------------------- Wound Assessment Details Patient Name: [name], [name] Date of Service: 05/17/2021 8:15 AM Medical Record Number: [id number] Patient Account Number: [id number] Date of Birth/Sex: 1951-05-22 (70 y.o. M) Treating RN: Cornell Barman Primary Care Carliss Porcaro: Beverlyn Roux Other Clinician: Referring Kazuto Sevey: Beverlyn Roux Treating Thereasa Iannello/Extender: Skipper Cliche in Treatment: 2 Wound Status Wound Number: 3 Primary Diabetic Wound/Ulcer of the Lower Extremity Etiology: Wound Location: Right Toe Second Wound Healed - Epithelialized Wounding Event: Gradually Appeared Status: Date Acquired: 05/03/2021 Comorbid  Cataracts, Lymphedema, Deep Vein Thrombosis, Weeks Of Treatment: 2 History: Hypertension, Type II Diabetes, Osteoarthritis, Clustered Wound: Yes Osteomyelitis Wound Measurements Length: (cm) 0 Width: (cm) 0 Depth: (cm) 0 Clustered Quantity: 3 Area: (cm) 0 Volume: (cm) 0 % Reduction in Area: 100% % Reduction in Volume: 100% Epithelialization: Large (67-100%) Tunneling: No Undermining: No Wound Description Classification: Unable to visualize wound bed Exudate Amount: None Present Foul Odor After Cleansing: No Slough/Fibrino No Wound Bed Granulation Amount: None Present (0%) Exposed Structure Necrotic Amount: None Present (0%) Fascia Exposed: No Fat Layer (Subcutaneous Tissue) Exposed: No Tendon Exposed: No Muscle Exposed: No Joint Exposed: No Bone Exposed: No Electronic Signature(s) Signed: 05/17/2021 4:40:04 PM By: Dolan Amen RN Signed: 05/20/2021 4:05:19 PM By: Gretta Cool, BSN, RN, CWS, Kim RN, BSN Entered By: Dolan Amen on 05/17/2021 08:57:53 [name] ([id number]) -------------------------------------------------------------------------------- Wound Assessment Details Patient Name: [name] Date of Service: 05/17/2021 8:15 AM Medical Record Number: [id number] Patient Account Number: [id number] Date of Birth/Sex: 07-Sep-1950 (70 y.o. M) Treating RN: Cornell Barman Primary Care Susie Pousson: Beverlyn Roux Other Clinician: Referring Jeremiyah Cullens: Beverlyn Roux Treating Uzziel Russey/Extender: Skipper Cliche in Treatment: 2 Wound Status Wound Number: 4 Primary Diabetic Wound/Ulcer of the Lower Extremity Etiology: Wound Location: Right Toe Great Wound Open Wounding Event: Gradually Appeared Status: Date Acquired: 05/03/2021 Comorbid Cataracts, Lymphedema, Deep Vein Thrombosis, Weeks Of Treatment: 2 History: Hypertension, Type II Diabetes, Osteoarthritis, Clustered Wound: No Osteomyelitis Wound Measurements Length: (cm) 0 Width: (cm) 0 Depth: (cm)  0 Area: (cm) 0 Volume: (cm) 0 % Reduction in Area: 100% % Reduction in Volume: 100% Epithelialization: Large (67-100%) Tunneling: No Undermining: No Wound Description Classification: Grade 2 Exudate Amount: None Present Foul Odor After Cleansing: No Slough/Fibrino No Wound Bed Granulation Amount: None Present (0%) Exposed Structure Necrotic Amount: None Present (0%) Fascia Exposed: No Fat Layer (Subcutaneous Tissue) Exposed: No Tendon Exposed: No Muscle Exposed: No Joint Exposed: No Bone Exposed: No Electronic Signature(s) Signed: 05/17/2021 4:40:04 PM By: Dolan Amen RN Signed: 05/20/2021 4:05:19 PM By: Gretta Cool, BSN, RN, CWS, Kim RN, BSN Entered By: Dolan Amen on 05/17/2021 08:58:22 [name] ([id number]) -------------------------------------------------------------------------------- Wound Assessment Details Patient Name: [name] Date of Service: 05/17/2021 8:15 AM Medical Record Number: [id number] Patient Account Number: [id number] Date of Birth/Sex: Jun 16, 1951 (70 y.o. M) Treating RN: Cornell Barman Primary Care Ingri Diemer: Beverlyn Roux Other Clinician: Referring Phuong Hillary: Beverlyn Roux Treating Annelle Behrendt/Extender: Skipper Cliche in  Treatment: 2 Wound Status Wound Number: 5 Primary Diabetic Wound/Ulcer of the Lower Extremity Etiology: Wound Location: Left Toe Second Wound Healed - Epithelialized Wounding Event: Gradually Appeared Status: Date Acquired: 05/03/2021 Comorbid Cataracts, Lymphedema, Deep Vein Thrombosis, Weeks Of Treatment: 2 History: Hypertension, Type II Diabetes, Osteoarthritis, Clustered Wound: No Osteomyelitis Wound Measurements Length: (cm) 0 Width: (cm) 0 Depth: (cm) 0 Area: (cm) 0 Volume: (cm) 0 % Reduction in Area: 100% % Reduction in Volume: 100% Epithelialization: Large (67-100%) Tunneling: No Undermining: No Wound Description Classification: Grade 1 Exudate Amount: None Present Foul Odor After Cleansing:  No Slough/Fibrino No Wound Bed Granulation Amount: None Present (0%) Exposed Structure Necrotic Amount: None Present (0%) Fascia Exposed: No Fat Layer (Subcutaneous Tissue) Exposed: No Tendon Exposed: No Muscle Exposed: No Joint Exposed: No Bone Exposed: No Electronic Signature(s) Signed: 05/17/2021 4:40:04 PM By: Dolan Amen RN Signed: 05/20/2021 4:05:19 PM By: Gretta Cool, BSN, RN, CWS, Kim RN, BSN Entered By: Dolan Amen on 05/17/2021 08:57:18 [name] ([id number]) -------------------------------------------------------------------------------- Wound Assessment Details Patient Name: [name] Date of Service: 05/17/2021 8:15 AM Medical Record Number: [id number] Patient Account Number: [id number] Date of Birth/Sex: 09-22-1950 (70 y.o. M) Treating RN: Cornell Barman Primary Care Alanta Scobey: Beverlyn Roux Other Clinician: Referring Nixon Kolton: Beverlyn Roux Treating Marki Frede/Extender: Skipper Cliche in Treatment: 2 Wound Status Wound Number: 6 Primary Diabetic Wound/Ulcer of the Lower Extremity Etiology: Wound Location: Left Toe Great Wound Healed - Epithelialized Wounding Event: Gradually Appeared Status: Date Acquired: 05/10/2021 Comorbid Cataracts, Lymphedema, Deep Vein Thrombosis, Weeks Of Treatment: 1 History: Hypertension, Type II Diabetes, Osteoarthritis, Clustered Wound: No Osteomyelitis Wound Measurements Length: (cm) 0 Width: (cm) 0 Depth: (cm) 0 Area: (cm) 0 Volume: (cm) 0 % Reduction in Area: 100% % Reduction in Volume: 100% Epithelialization: Large (67-100%) Tunneling: No Undermining: No Wound Description Classification: Grade 2 Exudate Amount: None Present Foul Odor After Cleansing: No Slough/Fibrino No Wound Bed Granulation Amount: None Present (0%) Exposed Structure Necrotic Amount: None Present (0%) Fascia Exposed: No Fat Layer (Subcutaneous Tissue) Exposed: No Tendon Exposed: No Muscle Exposed: No Joint Exposed: No Bone  Exposed: No Treatment Notes Wound #6 (Toe Great) Wound Laterality: Left Cleanser Peri-Wound Care Topical Primary Dressing Secondary Dressing Secured With Compression Wrap Compression Stockings Add-Ons Electronic Signature(s) Signed: 05/20/2021 4:05:19 PM By: Gretta Cool, BSN, RN, CWS, Kim RN, BSN Entered By: Gretta Cool, BSN, RN, CWS, Kim on 05/17/2021 08:43:40 [name] ([id number]) -------------------------------------------------------------------------------- Wound Assessment Details Patient Name: [name] Date of Service: 05/17/2021 8:15 AM Medical Record Number: [id number] Patient Account Number: [id number] Date of Birth/Sex: May 04, 1951 (70 y.o. M) Treating RN: Cornell Barman Primary Care Alayja Armas: Beverlyn Roux Other Clinician: Referring Jonel Sick: Beverlyn Roux Treating Chanae Gemma/Extender: Skipper Cliche in Treatment: 2 Wound Status Wound Number: 7 Primary Etiology: Diabetic Wound/Ulcer of the Lower Extremity Wound Location: Left, Dorsal Foot Wound Status: Open Wounding Event: Gradually Appeared Date Acquired: 05/10/2021 Weeks Of Treatment: 1 Clustered Wound: No Wound Measurements Length: (cm) 0.1 Width: (cm) 0.1 Depth: (cm) 0.1 Area: (cm) 0.008 Volume: (cm) 0.001 % Reduction in Area: 0% % Reduction in Volume: 0% Wound Description Classification: Unable to visualize wound bed Exudate Amount: Medium Exudate Type: Serous Exudate Color: amber Treatment Notes Wound #7 (Foot) Wound Laterality: Dorsal, Left Cleanser Wound Cleanser Discharge Instruction: Wash your hands with soap and water. Remove old dressing, discard into plastic bag and place into trash. Cleanse the wound with Wound Cleanser prior to applying a clean dressing using gauze sponges, not tissues or cotton balls. Do not  scrub or use excessive force. Pat dry using gauze sponges, not tissue or cotton balls. Peri-Wound Care Topical Primary Dressing Hydrofera Blue Ready Transfer Foam, 2.5x2.5  (in/in) Discharge Instruction: Apply Hydrofera Blue Ready to wound bed as directed Secondary Dressing Coverlet Latex-Free Fabric Adhesive Dressings Discharge Instruction: 1.5 x 2 Secured With Compression Wrap Compression Stockings Add-Ons Electronic Signature(s) Signed: 05/20/2021 4:05:19 PM By: Gretta Cool, BSN, RN, CWS, Kim RN, BSN Entered By: Gretta Cool, BSN, RN, CWS, Kim on 05/17/2021 08:44:53 [name] ([id number]) -------------------------------------------------------------------------------- [place] Details Patient Name: [name] Date of Service: 05/17/2021 8:15 AM Medical Record Number: [id number] Patient Account Number: [id number] Date of Birth/Sex: 1951/03/13 (70 y.o. M) Treating RN: Cornell Barman Primary Care Lillyanne Bradburn: Beverlyn Roux Other Clinician: Referring Juletta Berhe: Beverlyn Roux Treating Tzippy Testerman/Extender: Skipper Cliche in Treatment: 2 Vital Signs Time Taken: 08:18 Temperature (F): 97.8 Height (in): 71 Pulse (bpm): 65 Weight (lbs): 255 Respiratory Rate (breaths/min): 16 Body Mass Index (BMI): 35.6 Blood Pressure (mmHg): 117/62 Reference Range: 80 - 120 mg / dl Electronic Signature(s) Signed: 05/20/2021 4:05:19 PM By: Gretta Cool, BSN, RN, CWS, Kim RN, BSN Entered By: Gretta Cool, BSN, RN, CWS, Kim on 05/17/2021 48:62:82

## 2021-05-17 NOTE — Progress Notes (Addendum)
[name], [name] ([id number]) Visit Report for 05/17/2021 Chief Complaint Document Details Patient Name: [name], [name]. Date of Service: 05/17/2021 8:15 AM Medical Record Number: [id number] Patient Account Number: [id number] Date of Birth/Sex: [birth year] (70 y.o. M) Treating RN: Dolan Amen Primary Care Provider: Beverlyn Roux Other Clinician: Referring Provider: Beverlyn Roux Treating Provider/Extender: Skipper Cliche in Treatment: 2 Information Obtained from: Patient Chief Complaint Bilateral foot ulcers Electronic Signature(s) Signed: 05/17/2021 8:17:45 AM By: Worthy Keeler PA-C Entered By: Worthy Keeler on 05/17/2021 08:17:45 [name] ([id number]) -------------------------------------------------------------------------------- Debridement Details Patient Name: [name] Date of Service: 05/17/2021 8:15 AM Medical Record Number: [id number] Patient Account Number: [id number] Date of Birth/Sex: October 21, 1950 (70 y.o. M) Treating RN: Dolan Amen Primary Care Provider: Beverlyn Roux Other Clinician: Referring Provider: Beverlyn Roux Treating Provider/Extender: Skipper Cliche in Treatment: 2 Debridement Performed for Wound #1 Right,Proximal,Lateral Foot Assessment: Performed By: Physician Tommie Sams., PA-C Debridement Type: Debridement Severity of Tissue Pre Debridement: Fat layer exposed Level of Consciousness (Pre- Awake and Alert procedure): Pre-procedure Verification/Time Out Yes - 08:57 Taken: Start Time: 08:57 Total Area Debrided (L x W): 1.5 (cm) x 2 (cm) = 3 (cm) Tissue and other material Viable, Non-Viable, Slough, Subcutaneous, Slough debrided: Level: Skin/Subcutaneous Tissue Debridement Description: Excisional Instrument: Curette Bleeding: Minimum Hemostasis Achieved: Pressure Response to Treatment: Procedure was tolerated well Level of Consciousness (Post- Awake and Alert procedure): Post Debridement Measurements of  Total Wound Length: (cm) 1.5 Width: (cm) 2 Depth: (cm) 0.4 Volume: (cm) 0.942 Character of Wound/Ulcer Post Debridement: Stable Severity of Tissue Post Debridement: Fat layer exposed Post Procedure Diagnosis Same as Pre-procedure Electronic Signature(s) Signed: 05/17/2021 4:40:04 PM By: Dolan Amen RN Signed: 05/17/2021 5:37:36 PM By: Worthy Keeler PA-C Entered By: Dolan Amen on 05/17/2021 09:00:08 [name] ([id number]) -------------------------------------------------------------------------------- Debridement Details Patient Name: [name] Date of Service: 05/17/2021 8:15 AM Medical Record Number: [id number] Patient Account Number: [id number] Date of Birth/Sex: [birth year] (70 y.o. M) Treating RN: Dolan Amen Primary Care Provider: Beverlyn Roux Other Clinician: Referring Provider: Beverlyn Roux Treating Provider/Extender: Skipper Cliche in Treatment: 2 Debridement Performed for Wound #2 Right,Distal,Lateral Foot Assessment: Performed By: Physician Tommie Sams., PA-C Debridement Type: Debridement Severity of Tissue Pre Debridement: Fat layer exposed Level of Consciousness (Pre- Awake and Alert procedure): Pre-procedure Verification/Time Out Yes - 08:57 Taken: Start Time: 08:57 Total Area Debrided (L x W): 1.6 (cm) x 1.4 (cm) = 2.24 (cm) Tissue and other material Viable, Non-Viable, Slough, Subcutaneous, Slough debrided: Level: Skin/Subcutaneous Tissue Debridement Description: Excisional Instrument: Curette Bleeding: Minimum Hemostasis Achieved: Pressure Response to Treatment: Procedure was tolerated well Level of Consciousness (Post- Awake and Alert procedure): Post Debridement Measurements of Total Wound Length: (cm) 1.6 Width: (cm) 1.4 Depth: (cm) 0.2 Volume: (cm) 0.352 Character of Wound/Ulcer Post Debridement: Stable Severity of Tissue Post Debridement: Fat layer exposed Post Procedure Diagnosis Same as  Pre-procedure Electronic Signature(s) Signed: 05/17/2021 4:40:04 PM By: Dolan Amen RN Signed: 05/17/2021 5:37:36 PM By: Worthy Keeler PA-C Entered By: Dolan Amen on 05/17/2021 09:00:35 [name] ([id number]) -------------------------------------------------------------------------------- HPI Details Patient Name: [name] Date of Service: 05/17/2021 8:15 AM Medical Record Number: [id number] Patient Account Number: [id number] Date of Birth/Sex: [birth year] (70 y.o. 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 Has been seen by [facility]iatry [facility] for a number of years it sounds like at least 4 years. With that being said they have tried a split thickness skin graft along with multiple other topical remedies over the time they have been seeing him over the past several years. Unfortunately they are not able to get this closed I did refer him to [place] for further evaluation and treatment. The patient tells me has been quite sometime since he remembers having an x-ray and again we could not find anything recent when looking through his chart. Fortunately there does not appear to be any signs of active infection systemically which is great news. No fevers, chills, nausea, vomiting, or diarrhea. The patient does have diabetes with his most recent hemoglobin A1c being November 14, 2020 at 9.1. He also does have evidence of lymphedema based on what I am seeing today. 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 05/10/2021 upon evaluation today patient appears to be doing well with regard to his wounds in general. I feel like he is making progress. Fortunately the compression wrap did well for him and overall I am extremely happy with where we stand. No fevers, chills, nausea, vomiting, or diarrhea. 05/17/2021 upon evaluation today patient appears to be doing better in general regard to his wounds. Overall I feel like he is actually signs of good improvement which is great news and I think that we are headed in the right direction. There does not appear to be any signs of active infection at this time. No fevers, chills, nausea, vomiting, or diarrhea. Electronic Signature(s) Signed: 05/17/2021 9:08:48 AM By: Worthy Keeler PA-C Entered By: Worthy Keeler on 05/17/2021 09:08:48 [name] ([id number]) -------------------------------------------------------------------------------- Physical Exam Details Patient Name: [name], [name] Date of Service: 05/17/2021 8:15 AM Medical Record Number: [id number] Patient Account Number: [id number] Date of Birth/Sex: 1951-01-02 (70 y.o. M) Treating RN: Dolan Amen Primary Care Provider: Beverlyn Roux Other Clinician: Referring Provider: Beverlyn Roux Treating Provider/Extender: Skipper Cliche in Treatment: 2 Constitutional Well-nourished and well-hydrated in no acute distress. Respiratory normal breathing without difficulty. Psychiatric this patient is able to make decisions and demonstrates good insight into disease process. Alert and Oriented x 3. pleasant and cooperative. Notes Upon inspection patient's wound bed showed signs of good granulation epithelization for the most part. We actually did do fluorescence imaging and this showed that the patient did have cyan discoloration in the  distal wound as well as the proximal wound he also had blush fluorescence in the proximal wound. Subsequently we did perform targeted debridement as well as cleansing with a benzalkonium solution and postdebridement the wound bed appears to be doing a little bit better as far as the bacterial bioburden is concerned. Nonetheless I think that we will continue to more aggressively follow and watch out for this as well going forward. The debridement postdebridement did appear to be doing significantly better which is great news. Electronic Signature(s) Signed: 05/17/2021 9:09:46 AM By: Worthy Keeler PA-C Entered By: Worthy Keeler on 05/17/2021 09:09:46 [name] ([id number]) -------------------------------------------------------------------------------- Physician Orders Details Patient Name:  [name] Date of Service: 05/17/2021 8:15 AM Medical Record Number: [id number] Patient Account Number: [id number] Date of Birth/Sex: [birth year] (70 y.o. 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 o Nurse Visit as needed - Call for nurse visit IF compression wrap: 1. Gets wet 2. Slides down to about a third of the leg or 3. Gets torn or messed up [employer] o May shower with wound  dressing protected with water repellent cover or cast protector. o No tub bath. Edema Control - Lymphedema / Segmental Compressive Device / Other Right Lower Extremity o Optional: One layer of unna paste to top of compression wrap (to act as an anchor). o Elevate legs to the level of the heart and pump ankles as often as possible o Elevate leg(s) parallel to the floor when sitting. o Compression Pump: Use compression pump on left lower extremity for 60 minutes, twice daily. o Compression Pump: Use compression pump on right lower extremity for 60 minutes, twice daily. Wound Treatment Wound #1 - Foot Wound Laterality: Right, Lateral, Proximal Cleanser: Wound Cleanser 1 x Per Week/30 Days Discharge Instructions: Wash your hands with soap and water. Remove old dressing, discard into plastic bag and place into trash. Cleanse the wound with Wound Cleanser prior to applying a clean dressing using gauze sponges, not tissues or cotton balls. Do not scrub or use excessive force. Pat dry using gauze sponges, not tissue or cotton balls. Primary Dressing: Hydrofera Blue Ready Transfer Foam, 2.5x2.5 (in/in) 1 x Per Week/30 Days Discharge Instructions: Apply Hydrofera Blue Ready to wound bed as directed Secondary Dressing: ABD Pad 5x9 (in/in) 1 x Per Week/30 Days Discharge Instructions: Cover with ABD pad Compression Wrap: Profore Lite LF 3 Multilayer Compression Bandaging System 1 x Per Week/30 Days Discharge Instructions: Apply 3 multi-layer wrap as prescribed. Wound #2 - Foot Wound Laterality: Right, Lateral, Distal Cleanser: [employer] Ancillary Kit - 15 Day Supply (Generic) 1 x Per Week/30 Days Discharge Instructions: Use supplies as instructed; Kit contains: (15) Saline Bullets; (15) 3x3 Gauze; 15 pr Gloves Cleanser: Wound Cleanser 1 x Per Week/30 Days Discharge Instructions: Wash your hands with soap and water. Remove old dressing, discard into plastic bag and place into trash. Cleanse the  wound with Wound Cleanser prior to applying a clean dressing using gauze sponges, not tissues or cotton balls. Do not scrub or use excessive force. Pat dry using gauze sponges, not tissue or cotton balls. Primary Dressing: Hydrofera Blue Ready Transfer Foam, 2.5x2.5 (in/in) (Generic) 1 x Per Week/30 Days [name], [name] ([id number]) Discharge Instructions: Apply Hydrofera Blue Ready to wound bed as directed Secondary Dressing: ABD Pad 5x9 (in/in) 1 x Per Week/30 Days Discharge Instructions: Cover with ABD pad Compression Wrap: Profore Lite LF 3 Multilayer Compression Bandaging System 1 x Per Week/30 Days Discharge Instructions: Apply 3 multi-layer wrap as prescribed. Wound #7 - Foot Wound Laterality: Dorsal, Left Cleanser: Wound Cleanser 3 x Per  Week/30 Days Discharge Instructions: Wash your hands with soap and water. Remove old dressing, discard into plastic bag and place into trash. Cleanse the wound with Wound Cleanser prior to applying a clean dressing using gauze sponges, not tissues or cotton balls. Do not scrub or use excessive force. Pat dry using gauze sponges, not tissue or cotton balls. Primary Dressing: Hydrofera Blue Ready Transfer Foam, 2.5x2.5 (in/in) 3 x Per Week/30 Days Discharge Instructions: Apply Hydrofera Blue Ready to wound bed as directed Secondary Dressing: Coverlet Latex-Free Fabric Adhesive Dressings 3 x Per Week/30 Days Discharge Instructions: 1.5 x 2 Electronic Signature(s) Signed: 05/17/2021 4:40:04 PM By: Dolan Amen RN Signed: 05/17/2021 5:37:36 PM By: Worthy Keeler PA-C Entered By: Dolan Amen on 05/17/2021 09:01:07 [name] ([id number]) -------------------------------------------------------------------------------- Problem List Details Patient Name: [name] Date of Service: 05/17/2021 8:15 AM Medical Record Number: [id number] Patient Account Number: [id number] Date of Birth/Sex: 1950-09-16 (70 y.o. M) Treating RN: Dolan Amen Primary Care Provider: Beverlyn Roux Other Clinician: Referring Provider: Beverlyn Roux Treating Provider/Extender: Skipper Cliche in Treatment: 2 Active Problems ICD-10 Encounter Code Description Active Date MDM Diagnosis E11.621 Type 2 diabetes mellitus with foot ulcer 05/03/2021 No Yes L97.512 Non-pressure chronic ulcer of other part of right foot with fat layer 05/03/2021 No Yes exposed L97.522 Non-pressure chronic ulcer of other part of left foot with fat layer 05/03/2021 No Yes exposed Z89.421 Acquired absence of other right toe(s) 05/03/2021 No Yes I10 Essential (primary) hypertension 05/03/2021 No Yes Z79.01 Long term (current) use of anticoagulants 05/03/2021 No Yes Z86.718 Personal history of other venous thrombosis and embolism 05/03/2021 No Yes Inactive Problems Resolved Problems Electronic Signature(s) Signed: 05/17/2021 8:17:39 AM By: Worthy Keeler PA-C Entered By: Worthy Keeler on 05/17/2021 08:17:39 [name] ([id number]) -------------------------------------------------------------------------------- Progress Note Details Patient Name: [name] Date of Service: 05/17/2021 8:15 AM Medical Record Number: [id number] Patient Account Number: [id number] Date of Birth/Sex: 1950/11/26 (70 y.o. 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 Has been seen by [facility] for a number of years it sounds like at  least 4 years. With that being said they have tried a split thickness skin graft along with multiple other topical remedies over the time they have been seeing him over the past several years. Unfortunately they are not able to get this closed I did refer him to [place] for further evaluation and treatment. The patient tells me has been quite sometime since he remembers having an x-ray and again we could not find anything recent when looking through his chart. Fortunately there does not appear to be any signs of active infection systemically which is great news. No fevers, chills, nausea, vomiting, or diarrhea. The patient does have diabetes with his most recent hemoglobin A1c being November 14, 2020 at 9.1. He also does have evidence of lymphedema based on what I am seeing today. He has again the fifth toe ray amputation on the right foot, hypertension, long-term use of anticoagulants due to a history  of DVT, and in general is very concerned about potentially losing his foot. 05/10/2021 upon evaluation today patient appears to be doing well with regard to his wounds in general. I feel like he is making progress. Fortunately the compression wrap did well for him and overall I am extremely happy with where we stand. No fevers, chills, nausea, vomiting, or diarrhea. 05/17/2021 upon evaluation today patient appears to be doing better in general regard to his wounds. Overall I feel like he is actually signs of good improvement which is great news and I think that we are headed in the right direction. There does not appear to be any signs of active infection at this time. No fevers, chills, nausea, vomiting, or diarrhea. Objective Constitutional Well-nourished and well-hydrated in no acute distress. Vitals Time Taken: 8:18 AM, Height: 71 in, Weight: 255 lbs, BMI: 35.6, Temperature: 97.8 F, Pulse: 65 bpm, Respiratory Rate: 16 breaths/min, Blood Pressure: 117/62 mmHg. Respiratory normal breathing without  difficulty. Psychiatric this patient is able to make decisions and demonstrates good insight into disease process. Alert and Oriented x 3. pleasant and cooperative. General Notes: Upon inspection patient's wound bed showed signs of good granulation epithelization for the most part. We actually did do fluorescence imaging and this showed that the patient did have cyan discoloration in the distal wound as well as the proximal wound he also had blush fluorescence in the proximal wound. Subsequently we did perform targeted debridement as well as cleansing with a benzalkonium solution and postdebridement the wound bed appears to be doing a little bit better as far as the bacterial bioburden is concerned. Nonetheless I think that we will continue to more aggressively follow and watch out for this as well going forward. The debridement postdebridement did appear to be doing significantly better which is great news. Integumentary (Hair, Skin) Wound #1 status is Open. Original cause of wound was Gradually Appeared. The date acquired was: 05/03/2020. The wound has been in treatment 2 weeks. The wound is located on the Right,Proximal,Lateral Foot. The wound measures 1.5cm length x 2cm width x 0.4cm depth; 2.356cm^2 area and 0.942cm^3 volume. There is Fat Layer (Subcutaneous Tissue) exposed. There is no tunneling noted, however, there is undermining starting at 7:00 and ending at 11:00 with a maximum distance of 0.4cm. There is a medium amount of serosanguineous drainage noted. There is large (67-100%) red, pink granulation within the wound bed. There is a small (1-33%) amount of necrotic tissue within the wound bed including Adherent Slough. [name], [name] ([id number]) Wound #2 status is Open. Original cause of wound was Gradually Appeared. The date acquired was: 05/03/2020. The wound has been in treatment 2 weeks. The wound is located on the Right,Distal,Lateral Foot. The wound measures 1.6cm length x 1.4cm  width x 0.1cm depth; 1.759cm^2 area and 0.176cm^3 volume. There is Fat Layer (Subcutaneous Tissue) exposed. There is no tunneling or undermining noted. There is a medium amount of serosanguineous drainage noted. There is large (67-100%) red granulation within the wound bed. There is no necrotic tissue within the wound bed. Wound #3 status is Healed - Epithelialized. Original cause of wound was Gradually Appeared. The date acquired was: 05/03/2021. The wound has been in treatment 2 weeks. The wound is located on the Right Toe Second. The wound measures 0cm length x 0cm width x 0cm depth; 0cm^2 area and 0cm^3 volume. There is no tunneling or undermining noted. There is a none present amount of drainage noted. There is no granulation within the wound bed.  There is no necrotic tissue within the wound bed. Wound #4 status is Open. Original cause of wound was Gradually Appeared. The date acquired was: 05/03/2021. The wound has been in treatment 2 weeks. The wound is located on the Right Toe Great. The wound measures 0cm length x 0cm width x 0cm depth; 0cm^2 area and 0cm^3 volume. There is no tunneling or undermining noted. There is a none present amount of drainage noted. There is no granulation within the wound bed. There is no necrotic tissue within the wound bed. Wound #5 status is Healed - Epithelialized. Original cause of wound was Gradually Appeared. The date acquired was: 05/03/2021. The wound has been in treatment 2 weeks. The wound is located on the Left Toe Second. The wound measures 0cm length x 0cm width x 0cm depth; 0cm^2 area and 0cm^3 volume. There is no tunneling or undermining noted. There is a none present amount of drainage noted. There is no granulation within the wound bed. There is no necrotic tissue within the wound bed. Wound #6 status is Healed - Epithelialized. Original cause of wound was Gradually Appeared. The date acquired was: 05/10/2021. The wound has been in treatment 1 weeks. The  wound is located on the Left Toe Great. The wound measures 0cm length x 0cm width x 0cm depth; 0cm^2 area and 0cm^3 volume. There is no tunneling or undermining noted. There is a none present amount of drainage noted. There is no granulation within the wound bed. There is no necrotic tissue within the wound bed. Wound #7 status is Open. Original cause of wound was Gradually Appeared. The date acquired was: 05/10/2021. The wound has been in treatment 1 weeks. The wound is located on the Left,Dorsal Foot. The wound measures 0.1cm length x 0.1cm width x 0.1cm depth; 0.008cm^2 area and 0.001cm^3 volume. There is a medium amount of serous drainage noted. 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 There was a Excisional Skin/Subcutaneous Tissue Debridement with a total area of 3 sq cm performed by Tommie Sams., PA-C. With the following instrument(s): Curette to remove Viable and Non-Viable tissue/material. Material removed includes Subcutaneous Tissue and Slough and. A time out was conducted at 08:57, prior to the start of the procedure. A Minimum amount of bleeding was controlled with Pressure. The procedure was tolerated well. Post Debridement Measurements: 1.5cm length x 2cm width x 0.4cm depth; 0.942cm^3 volume. Character of Wound/Ulcer Post Debridement is stable. Severity of Tissue Post Debridement is: Fat layer exposed. Post procedure  Diagnosis Wound #1: Same as Pre-Procedure Wound #2 Pre-procedure diagnosis of Wound #2 is a Diabetic Wound/Ulcer of the Lower Extremity located on the Right,Distal,Lateral Foot .Severity of Tissue Pre Debridement is: Fat layer exposed. There was a Excisional Skin/Subcutaneous Tissue Debridement with a total area of 2.24 sq cm performed by Tommie Sams., PA-C. With the following instrument(s): Curette to remove Viable and Non-Viable tissue/material. Material removed includes Subcutaneous Tissue and Slough and. A time out was conducted at 08:57, prior to the start of the procedure.  A Minimum amount of bleeding was controlled with Pressure. The procedure was tolerated well. Post Debridement Measurements: 1.6cm length x 1.4cm width x 0.2cm depth; 0.352cm^3 volume. Character of Wound/Ulcer Post Debridement is stable. Severity of Tissue Post Debridement is: Fat layer exposed. Post procedure Diagnosis Wound #2: Same as Pre-Procedure [name], [name] ([id number]) Plan Follow-up Appointments: Return Appointment in 1 week. Nurse Visit as needed - Call for nurse visit IF compression wrap: 1. Gets wet 2. Slides down to about a third of the leg or 3. Gets torn or messed up Bathing/ Shower/ Hygiene: May shower with wound dressing protected with water repellent cover or cast protector. No tub bath. Edema Control - Lymphedema / Segmental Compressive Device / Other: Optional: One layer of unna paste to top of compression wrap (to act as an anchor). Elevate legs to the level of the heart and pump ankles as often as possible Elevate leg(s) parallel to the floor when sitting. Compression Pump: Use compression pump on left lower extremity for 60 minutes, twice daily. Compression Pump: Use compression pump on right lower extremity for 60 minutes, twice daily. WOUND #1: - Foot Wound Laterality: Right, Lateral, Proximal Cleanser: Wound Cleanser 1 x Per Week/30 Days Discharge Instructions: Wash your hands with  soap and water. Remove old dressing, discard into plastic bag and place into trash. Cleanse the wound with Wound Cleanser prior to applying a clean dressing using gauze sponges, not tissues or cotton balls. Do not scrub or use excessive force. Pat dry using gauze sponges, not tissue or cotton balls. Primary Dressing: Hydrofera Blue Ready Transfer Foam, 2.5x2.5 (in/in) 1 x Per Week/30 Days Discharge Instructions: Apply Hydrofera Blue Ready to wound bed as directed Secondary Dressing: ABD Pad 5x9 (in/in) 1 x Per Week/30 Days Discharge Instructions: Cover with ABD pad Compression Wrap: Profore Lite LF 3 Multilayer Compression Bandaging System 1 x Per Week/30 Days Discharge Instructions: Apply 3 multi-layer wrap as prescribed. WOUND #2: - Foot Wound Laterality: Right, Lateral, Distal Cleanser: [place]ram Ancillary Kit - 15 Day Supply (Generic) 1 x Per Week/30 Days Discharge Instructions: Use supplies as instructed; Kit contains: (15) Saline Bullets; (15) 3x3 Gauze; 15 pr Gloves Cleanser: Wound Cleanser 1 x Per Week/30 Days Discharge Instructions: Wash your hands with soap and water. Remove old dressing, discard into plastic bag and place into trash. Cleanse the wound with Wound Cleanser prior to applying a clean dressing using gauze sponges, not tissues or cotton balls. Do not scrub or use excessive force. Pat dry using gauze sponges, not tissue or cotton balls. Primary Dressing: Hydrofera Blue Ready Transfer Foam, 2.5x2.5 (in/in) (Generic) 1 x Per Week/30 Days Discharge Instructions: Apply Hydrofera Blue Ready to wound bed as directed Secondary Dressing: ABD Pad 5x9 (in/in) 1 x Per Week/30 Days Discharge Instructions: Cover with ABD pad Compression Wrap: Profore Lite LF 3 Multilayer Compression Bandaging System 1 x Per Week/30 Days Discharge Instructions: Apply 3 multi-layer wrap as prescribed. WOUND #7: - Foot Wound Laterality: Dorsal, Left Cleanser: Wound Cleanser 3 x Per Week/30 Days Discharge  Instructions: Wash your hands with soap and water. Remove old dressing, discard into plastic bag and place into trash. Cleanse the wound with Wound Cleanser prior to applying a clean dressing using gauze sponges, not tissues or cotton balls. Do not scrub or use excessive force. Pat dry using gauze sponges, not tissue or cotton balls. Primary Dressing: Hydrofera Blue Ready Transfer Foam, 2.5x2.5 (in/in) 3 x Per Week/30 Days Discharge Instructions: Apply Hydrofera Blue Ready to wound bed as  directed Secondary Dressing: Coverlet Latex-Free Fabric Adhesive Dressings 3 x Per Week/30 Days Discharge Instructions: 1.5 x 2 1. Would recommend currently that we going to continue with the wound care measures as before and the patient is in agreement the plan. This includes the use of the [facility] which I think is doing a good job. 2. I am also can recommend that we continue with the 3 layer compression wrap which I think is also good to be beneficial for him here today. 3. I would also recommend that we go ahead and continue with the 3 layer compression wrap which I think is doing a great job for the edema control I see definite improvement here. We will see patient back for reevaluation in 1 week here in the clinic. If anything worsens or changes patient will contact our office for additional recommendations. MolecuLight DX: 1st Scanned Wound The following wound was scanned with MolecuLight DX): Right distal foot Fluorescence bacterial imaging was medically necessary today due to Initial Evaluation of the wound with MolecuLightDX to determine (Indication): baseline bacterial bioburden level MolecuLight Results Cyan Colors., Green Colors The indicated colors were noted in the following area(s). In the periphery of the wound As a result of todayos scan, the following treatment plans were put in Targeted debridement was performed and aggressive cleaning with place. benzalkonium MolecuLight DX: 2nd  Scanned Wound [name], [name] ([id number]) The following wound was scanned with MolecuLight DX): Right proximal foot Fluorescence bacterial imaging was medically necessary today due to Initial Evaluation of the wound with MolecuLightDX to determine (Indication): baseline bacterial bioburden level MolecuLight Results Blush Colors, Yellow Colors, Cyan Colors, Green Colors The indicated colors were noted in the following area(s). In the periphery of the wound As a result of todayos scan, the following treatment plans were put in Targeted debridement and aggressive cleansing with benzalkonium was place. performed MolecuLight Procedure The MolecularLight DX device was cleaned with a disinfectant wipe prior to use., The correct patient profile was confirmed and correct wound was verified., Range finder sensor used to ensure appropriate distance The following was completed: selected between imaging unit and wound bed, Room lights were turned off and the ambient light sensor was checked., Blue circle appeared around the lightbulb., The fluorescence icon was selected. Screen was tapped to enhance focus and the image was captured. Additional drapes were used to ensure adequate darknesso No Potential ICD-10 Codes B96.5 pseudomonas (Cyan Color), A49.9 Bacterial infection unspecified ICD-10 (Red Color) Additional Scanned Wounds Did you scan any additional Woundso No Electronic Signature(s) Signed: 05/17/2021 9:12:10 AM By: Worthy Keeler PA-C Entered By: Worthy Keeler on 05/17/2021 09:12:09 [name] ([id number]) -------------------------------------------------------------------------------- SuperBill Details Patient Name: [name] Date of Service: 05/17/2021 Medical Record Number: [id number] Patient Account Number: [id number] Date of Birth/Sex: 03-Apr-1951 (70 y.o. 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 layer exposed Z89.421 Acquired absence of other right toe(s) I10 Essential (primary) hypertension Z79.01 Long term (current) use of anticoagulants Z86.718 Personal history of other venous thrombosis and embolism Facility Procedures CPT4 Code: 13143888 Description: 75797 - DEB SUBQ TISSUE 20 SQ CM/< Modifier: Quantity: 1 CPT4 Code: Description: ICD-10 Diagnosis Description  L97.512 Non-pressure chronic ulcer of other part of right foot with fat layer expo Modifier: sed Quantity: CPT4 Code: 94496759 Description: NONCNTACT RT FLORO WND 1ST STE (CDM [id number]) Modifier: Quantity: 1 CPT4 Code: Description: ICD-10 Diagnosis Description L97.512 Non-pressure chronic ulcer of other part of right foot with fat layer expo Modifier: sed Quantity: CPT4 Code: 99357017 Description: NONCNTACT RT FLORO WND EA ADDL (CDM [id number]) Modifier: Quantity: 1 CPT4 Code: Description: ICD-10 Diagnosis Description L97.512 Non-pressure chronic ulcer of other part of right foot with fat layer expo Modifier: sed Quantity: Physician Procedures CPT4 Code: 9233007 Description: 62263 - WC PHYS SUBQ TISS 20 SQ CM Modifier: Quantity: 1 CPT4 Code: Description: ICD-10 Diagnosis Description L97.512 Non-pressure chronic ulcer of other part of right foot with fat layer expo Modifier: sed Quantity: CPT4 Code: 3354T Description: HC MOLECULIGHT WND IMG 1ST ANATOMIC SITE Modifier: Quantity: 1 CPT4 Code: Description: ICD-10 Diagnosis Description L97.512 Non-pressure chronic ulcer of other part of right foot with fat layer expo Modifier: sed Quantity: CPT4 Code: 6256L Description: HC  MOLECULIGHT WND IMG ADDL ANATOMIC SITE Modifier: Quantity: 1 CPT4 Code: Description: ICD-10 Diagnosis Description L97.512 Non-pressure chronic ulcer of other part of right foot with fat layer expo Modifier: sed Quantity: Electronic Signature(s) Signed: 05/17/2021 9:13:21 AM By: Worthy Keeler PA-C Previous Signature: 05/17/2021 9:12:29 AM Version By: Worthy Keeler PA-C Entered By: Worthy Keeler on 05/17/2021 09:13:21

## 2021-05-24 ENCOUNTER — Encounter: Payer: Medicare HMO | Admitting: Physician Assistant

## 2021-05-24 ENCOUNTER — Other Ambulatory Visit: Payer: Self-pay

## 2021-05-24 DIAGNOSIS — E11621 Type 2 diabetes mellitus with foot ulcer: Secondary | ICD-10-CM | POA: Diagnosis not present

## 2021-05-24 NOTE — Progress Notes (Addendum)
ALONTE, WULFF ([id number]) Visit Report for 05/24/2021 Chief Complaint Document Details Patient Name: [name], [name]. Date of Service: 05/24/2021 9:00 AM Medical Record Number: [id number] Patient Account Number: [id number] Date of Birth/Sex: 14-Oct-1950 (70 y.o. M) Treating RN: Dolan Amen Primary Care Provider: Beverlyn Roux Other Clinician: Referring Provider: Beverlyn Roux Treating Provider/Extender: Skipper Cliche in Treatment: 3 Information Obtained from: Patient Chief Complaint Bilateral foot ulcers Electronic Signature(s) Signed: 05/24/2021 9:07:34 AM By: Worthy Keeler PA-C Entered By: Worthy Keeler on 05/24/2021 09:07:34 [name] ([id number]) -------------------------------------------------------------------------------- Debridement Details Patient Name: [name] Date of Service: 05/24/2021 9:00 AM Medical Record Number: [id number] Patient Account Number: [id number] Date of Birth/Sex: 08-27-1951 (70 y.o. M) Treating RN: Dolan Amen Primary Care Provider: Beverlyn Roux Other Clinician: Referring Provider: Beverlyn Roux Treating Provider/Extender: Skipper Cliche in Treatment: 3 Debridement Performed for Wound #1 Right,Proximal,Lateral Foot Assessment: Performed By: Physician Tommie Sams., PA-C Debridement Type: Debridement Severity of Tissue Pre Debridement: Fat layer exposed Level of Consciousness (Pre- Awake and Alert procedure): Pre-procedure Verification/Time Out Yes - 09:53 Taken: Start Time: 09:53 Total Area Debrided (L x W): 1.8 (cm) x 1.7 (cm) = 3.06 (cm) Tissue and other material Viable, Non-Viable, Slough, Subcutaneous, Skin: Epidermis, Slough debrided: Level: Skin/Subcutaneous Tissue Debridement Description: Excisional Instrument: Curette Bleeding: Moderate Hemostasis Achieved: Silver Nitrate Response to Treatment: Procedure was tolerated well Level of Consciousness (Post- Awake and Alert procedure): Post  Debridement Measurements of Total Wound Length: (cm) 1.8 Width: (cm) 1.7 Depth: (cm) 0.3 Volume: (cm) 0.721 Character of Wound/Ulcer Post Debridement: Stable Severity of Tissue Post Debridement: Fat layer exposed Post Procedure Diagnosis Same as Pre-procedure Notes 1 silver nitrate stick used Electronic Signature(s) Signed: 05/24/2021 4:36:36 PM By: Dolan Amen RN Signed: 05/24/2021 4:54:51 PM By: Worthy Keeler PA-C Entered By: Dolan Amen on 05/24/2021 09:56:27 [name] ([id number]) -------------------------------------------------------------------------------- HPI Details Patient Name: [name] Date of Service: 05/24/2021 9:00 AM Medical Record Number: [id number] Patient Account Number: [id number] Date of Birth/Sex: [birth year] (70 y.o. 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 Has been seen by [facility]iatry [facility] for a number of years it sounds like at least 4 years. With that being said they have tried a split thickness skin graft along with multiple other topical remedies over the time they have been seeing him over the past several years. Unfortunately they are not able to get this closed I did refer him to [place] for further evaluation and treatment. The patient tells me has been quite sometime since he remembers having an x-ray and again we could not find anything recent when looking through his chart. Fortunately there does not appear to be any signs of active infection  systemically which is great news. No fevers, chills, nausea, vomiting, or diarrhea. The patient does have diabetes with his most recent hemoglobin A1c being November 14, 2020 at 9.1. He also does have evidence of lymphedema based on what I am seeing today. 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 05/10/2021 upon evaluation today patient appears to be doing well with regard to his wounds in general. I feel like he is making progress. Fortunately the compression wrap did well for him and overall I am extremely happy with where we stand. No fevers, chills, nausea, vomiting, or diarrhea. 05/17/2021 upon evaluation today patient appears to be doing better in general regard to his wounds. Overall I feel like he is actually signs of good improvement which is great news and I think that we are headed in the right direction. There does not appear to be any signs of active infection at this time. No fevers, chills, nausea, vomiting, or diarrhea. 05/24/2021 upon evaluation today patient appears to be doing well with regard to his wounds. He is actually showing signs of improvement this is great news. Its mainly the right foot proximal wound that is going to require debridement today and again I do need to try to clear away some of the lip where he had some epiboly I then work on this week by week but if we get that cleared away I think this will heal much more effectively and quickly. Electronic Signature(s) Signed: 05/24/2021 10:08:00 AM By: Worthy Keeler PA-C Entered By: Worthy Keeler on 05/24/2021 10:07:59 [name] ([id number]) -------------------------------------------------------------------------------- Physical Exam Details Patient Name: [name], [name] Date of Service: 05/24/2021 9:00 AM Medical Record Number: [id number] Patient  Account Number: [id number] Date of Birth/Sex: December 28, 1950 (70 y.o. M) Treating RN: Dolan Amen Primary Care Provider: Beverlyn Roux Other Clinician: Referring Provider: Beverlyn Roux Treating Provider/Extender: Skipper Cliche in Treatment: 3 Constitutional Well-nourished and well-hydrated in no acute distress. Respiratory normal breathing without difficulty. Psychiatric this patient is able to make decisions and demonstrates good insight into disease process. Alert and Oriented x 3. pleasant and cooperative. Notes Upon inspection patient's wounds did require sharp debridement only in regard to the right foot proximal ulcer and he tolerated this debridement today without complication or pain. Post debridement wound bed appears to be doing much better which is great news overall I am extremely pleased with where we stand today. Otherwise everybody else actually seems to be doing quite well as far as the overall appearance is concerned of the wounds. Electronic Signature(s) Signed: 05/24/2021 10:08:37 AM By: Worthy Keeler PA-C Entered By: Worthy Keeler on 05/24/2021 10:08:37 [name] ([id number]) -------------------------------------------------------------------------------- Physician Orders Details Patient Name: [name], [name] Date of Service: 05/24/2021 9:00 AM Medical Record Number: [id number] Patient Account Number: [id number] Date of Birth/Sex: [birth year] (70 y.o. 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 o Nurse Visit as needed - Call for nurse visit IF compression wrap: 1. Gets wet 2. Slides down to about a third of the leg or 3. Gets torn or messed up [employer] o May shower with wound dressing protected  with water repellent cover or cast protector. o No tub bath. Edema Control - Lymphedema / Segmental Compressive Device / Other Right Lower Extremity o Optional: One layer of unna paste to top of compression wrap (to act as an anchor). o Elevate legs to the level of the heart and pump ankles as often as possible o Elevate leg(s) parallel to the floor when sitting. o Compression Pump: Use compression pump on left lower extremity for 60 minutes, twice daily. o Compression Pump: Use compression pump on right lower extremity for 60 minutes, twice daily. Wound Treatment Wound #1 - Foot Wound Laterality: Right, Lateral, Proximal Cleanser: Wound Cleanser 1 x Per Week/30 Days Discharge Instructions: Wash your hands with soap and water. Remove old dressing, discard into plastic bag and place into trash. Cleanse the wound with Wound Cleanser prior to applying a clean dressing using gauze sponges, not tissues or cotton balls. Do not scrub or use excessive force. Pat dry using gauze sponges, not tissue or cotton balls. Primary Dressing: Hydrofera Blue Ready Transfer Foam, 2.5x2.5 (in/in) 1 x Per Week/30 Days Discharge Instructions: Apply Hydrofera Blue Ready to wound bed as directed Secondary Dressing: ABD Pad 5x9 (in/in) 1 x Per Week/30 Days Discharge Instructions: Cover with ABD pad Compression Wrap: Profore Lite LF 3 Multilayer Compression Bandaging System 1 x Per Week/30 Days Discharge Instructions: Apply 3 multi-layer wrap as  prescribed. Wound #2 - Foot Wound Laterality: Right, Lateral, Distal Cleanser: Byram Ancillary Kit - 15 Day Supply (Generic) 1 x Per Week/30 Days Discharge Instructions: Use supplies as instructed; Kit contains: (15) Saline Bullets; (15) 3x3 Gauze; 15 pr Gloves Cleanser: Wound Cleanser 1 x Per Week/30 Days Discharge Instructions: Wash your hands with soap and water. Remove old dressing, discard into plastic bag and place into trash. Cleanse the wound with Wound Cleanser prior to applying a clean dressing using gauze sponges, not tissues or cotton balls. Do not scrub or use excessive force. Pat dry using gauze sponges, not tissue or cotton balls. Primary Dressing: Hydrofera Blue Ready Transfer Foam, 2.5x2.5 (in/in) (Generic) 1 x Per Week/30 Days [name], [name] ([id number]) Discharge Instructions: Apply Hydrofera Blue Ready to wound bed as directed Secondary Dressing: ABD Pad 5x9 (in/in) 1 x Per Week/30 Days Discharge Instructions: Cover with ABD pad Compression Wrap: Profore Lite LF 3 Multilayer Compression Bandaging System 1 x Per Week/30 Days Discharge Instructions: Apply 3 multi-layer wrap as prescribed. Electronic Signature(s) Signed: 05/24/2021 4:36:36 PM By: Dolan Amen RN Signed: 05/24/2021 4:54:51 PM By: Worthy Keeler PA-C Entered By: Dolan Amen on 05/24/2021 09:57:21 [name] ([id number]) -------------------------------------------------------------------------------- Problem List Details Patient Name: [name], [name] Date of Service: 05/24/2021 9:00 AM Medical Record Number: [id number] Patient Account Number: [id number] Date of Birth/Sex: 02/06/1951 (70 y.o. M) Treating RN: Dolan Amen Primary Care Provider: Beverlyn Roux Other Clinician: Referring Provider: Beverlyn Roux Treating Provider/Extender: Skipper Cliche in Treatment: 3 Active Problems ICD-10 Encounter Code Description Active Date MDM Diagnosis E11.621 Type 2 diabetes mellitus with  foot ulcer 05/03/2021 No Yes L97.512 Non-pressure chronic ulcer of other part of right foot with fat layer 05/03/2021 No Yes exposed L97.522 Non-pressure chronic ulcer of other part of left foot with fat layer 05/03/2021 No Yes exposed Z89.421 Acquired absence of other right toe(s) 05/03/2021 No Yes I10 Essential (primary) hypertension 05/03/2021 No Yes Z79.01 Long term (current) use of anticoagulants 05/03/2021 No Yes Z86.718 Personal history of other venous thrombosis and embolism 05/03/2021 No Yes Inactive Problems Resolved Problems Electronic Signature(s) Signed:  05/24/2021 9:07:28 AM By: Worthy Keeler PA-C Entered By: Worthy Keeler on 05/24/2021 09:07:28 [name] ([id number]) -------------------------------------------------------------------------------- Progress Note Details Patient Name: [name] Date of Service: 05/24/2021 9:00 AM Medical Record Number: [id number] Patient Account Number: [id number] Date of Birth/Sex: 01/23/1951 (70 y.o. 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 Has been seen by [facility] for a number of years it sounds like at least 4 years. With that being said they have tried a split thickness skin graft along with multiple other topical remedies over the time they have been seeing him over the past several years. Unfortunately they are not able to get this closed I did refer him to [place] for  further evaluation and treatment. The patient tells me has been quite sometime since he remembers having an x-ray and again we could not find anything recent when looking through his chart. Fortunately there does not appear to be any signs of active infection systemically which is great news. No fevers, chills, nausea, vomiting, or diarrhea. The patient does have diabetes with his most recent hemoglobin A1c being November 14, 2020 at 9.1. He also does have evidence of lymphedema based on what I am seeing today. 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 05/10/2021 upon evaluation today patient appears to be doing well with regard to his wounds in general. I feel like he is making progress. Fortunately the compression wrap did well for him and overall I am extremely happy with where we stand. No fevers, chills, nausea, vomiting, or diarrhea. 05/17/2021 upon evaluation today patient appears to be doing better in general regard to his wounds. Overall I feel like he is actually signs of good improvement which is great news and I think that we are headed in the right direction. There does not appear to be any signs of active infection at this time. No fevers, chills, nausea, vomiting, or diarrhea. 05/24/2021 upon evaluation today patient appears to be doing well with regard to his wounds. He is actually showing signs of improvement this is great news. Its mainly the right foot proximal wound that is going to require debridement today and again I do need to try to clear away some of the lip where he had some epiboly I then work on this week by week but if we get that cleared away I think this will heal much more effectively and quickly. Objective Constitutional Well-nourished and well-hydrated in no acute distress. Vitals Time Taken: 9:22 AM, Height: 71  in, Weight: 255 lbs, BMI: 35.6, Temperature: 97.8 F, Pulse: 70 bpm, Respiratory Rate: 18 breaths/min, Blood Pressure: 109/64 mmHg. Respiratory normal breathing without difficulty. Psychiatric this patient is able to make decisions and demonstrates good insight into disease process. Alert and Oriented x 3. pleasant and cooperative. General Notes: Upon inspection patient's wounds did require sharp debridement only in regard to  the right foot proximal ulcer and he tolerated this debridement today without complication or pain. Post debridement wound bed appears to be doing much better which is great news overall I am extremely pleased with where we stand today. Otherwise everybody else actually seems to be doing quite well as far as the overall appearance is concerned of the wounds. Integumentary (Hair, Skin) Wound #1 status is Open. Original cause of wound was Gradually Appeared. The date acquired was: 05/03/2020. The wound has been in treatment 3 weeks. The wound is located on the Right,Proximal,Lateral Foot. The wound measures 1.8cm length x 1.7cm width x 0.3cm depth; 2.403cm^2 area and 0.721cm^3 volume. There is Fat Layer (Subcutaneous Tissue) exposed. There is no tunneling noted, however, there is undermining [name], [name]. ([id number]) starting at 7:00 and ending at 11:00 with a maximum distance of 0.4cm. There is a medium amount of serosanguineous drainage noted. There is large (67-100%) red, pink granulation within the wound bed. There is a small (1-33%) amount of necrotic tissue within the wound bed including Adherent Slough. Wound #2 status is Open. Original cause of wound was Gradually Appeared. The date acquired was: 05/03/2020. The wound has been in treatment 3 weeks. The wound is located on the Right,Distal,Lateral Foot. The wound measures 1.3cm length x 1.2cm width x 0.2cm depth; 1.225cm^2 area and 0.245cm^3 volume. There is Fat Layer (Subcutaneous Tissue) exposed. There is no  tunneling or undermining noted. There is a medium amount of serosanguineous drainage noted. There is large (67-100%) red granulation within the wound bed. There is a small (1-33%) amount of necrotic tissue within the wound bed including Adherent Slough. Wound #7 status is Open. Original cause of wound was Gradually Appeared. The date acquired was: 05/10/2021. The wound has been in treatment 2 weeks. The wound is located on the Left,Dorsal Foot. The wound measures 0cm length x 0cm width x 0cm depth; 0cm^2 area and 0cm^3 volume. There is no tunneling or undermining noted. There is a none present amount of drainage noted. There is no granulation within the wound bed. There is no necrotic tissue within the wound bed. 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 There was a Excisional Skin/Subcutaneous Tissue Debridement with a total area of 3.06 sq cm performed by Tommie Sams., PA-C. With the following instrument(s): Curette to remove Viable and Non-Viable tissue/material. Material removed includes Subcutaneous Tissue, Slough, and Skin: Epidermis. A time out was conducted at 09:53, prior to the start of the procedure. A Moderate amount of bleeding was controlled with Silver Nitrate. The procedure was tolerated well. Post Debridement Measurements: 1.8cm length x 1.7cm width x 0.3cm depth; 0.721cm^3 volume. Character of Wound/Ulcer Post  Debridement is stable. Severity of Tissue Post Debridement is: Fat layer exposed. Post procedure Diagnosis Wound #1: Same as Pre-Procedure General Notes: 1 silver nitrate stick used. Pre-procedure diagnosis of Wound #1 is a Diabetic Wound/Ulcer of the Lower Extremity located on the Right,Proximal,Lateral Foot . There was a Three Layer Compression Therapy Procedure by Dolan Amen, RN. Post procedure  Diagnosis Wound #1: Same as Pre-Procedure Notes: pt tolerating wrap. Wound #2 Pre-procedure diagnosis of Wound #2 is a Diabetic Wound/Ulcer of the Lower Extremity located on the Right,Distal,Lateral Foot . There was a Three Layer Compression Therapy Procedure by Dolan Amen, RN. Post procedure Diagnosis Wound #2: Same as Pre-Procedure Notes: pt tolerating wrap. Plan Follow-up Appointments: Return Appointment in 1 week. Nurse Visit as needed - Call for nurse visit IF compression wrap: 1. Gets wet 2. Slides down to about a third of the leg or 3. Gets torn or messed up Bathing/ Shower/ Hygiene: May shower with wound dressing protected with water repellent cover or cast protector. No tub bath. Edema Control - Lymphedema / Segmental Compressive Device / Other: Optional: One layer of unna paste to top of compression wrap (to act as an anchor). [name], [name] ([id number]) Elevate legs to the level of the heart and pump ankles as often as possible Elevate leg(s) parallel to the floor when sitting. Compression Pump: Use compression pump on left lower extremity for 60 minutes, twice daily. Compression Pump: Use compression pump on right lower extremity for 60 minutes, twice daily. WOUND #1: - Foot Wound Laterality: Right, Lateral, Proximal Cleanser: Wound Cleanser 1 x Per Week/30 Days Discharge Instructions: Wash your hands with soap and water. Remove old dressing, discard into plastic bag and place into trash. Cleanse the wound with Wound Cleanser prior to applying a clean dressing using  gauze sponges, not tissues or cotton balls. Do not scrub or use excessive force. Pat dry using gauze sponges, not tissue or cotton balls. Primary Dressing: Hydrofera Blue Ready Transfer Foam, 2.5x2.5 (in/in) 1 x Per Week/30 Days Discharge Instructions: Apply Hydrofera Blue Ready to wound bed as directed Secondary Dressing: ABD Pad 5x9 (in/in) 1 x Per Week/30 Days Discharge Instructions: Cover with ABD pad Compression Wrap: Profore Lite LF 3 Multilayer Compression Bandaging System 1 x Per Week/30 Days Discharge Instructions: Apply 3 multi-layer wrap as prescribed. WOUND #2: - Foot Wound Laterality: Right, Lateral, Distal Cleanser: Byram Ancillary Kit - 15 Day Supply (Generic) 1 x Per Week/30 Days Discharge Instructions: Use supplies as instructed; Kit contains: (15) Saline Bullets; (15) 3x3 Gauze; 15 pr Gloves Cleanser: Wound Cleanser 1 x Per Week/30 Days Discharge Instructions: Wash your hands with soap and water. Remove old dressing, discard into plastic bag and place into trash. Cleanse the wound with Wound Cleanser prior to applying a clean dressing using gauze sponges, not tissues or cotton balls. Do not scrub or use excessive force. Pat dry using gauze sponges, not tissue or cotton balls. Primary Dressing: Hydrofera Blue Ready Transfer Foam, 2.5x2.5 (in/in) (Generic) 1 x Per Week/30 Days Discharge Instructions: Apply Hydrofera Blue Ready to wound bed as directed Secondary Dressing: ABD Pad 5x9 (in/in) 1 x Per Week/30 Days Discharge Instructions: Cover with ABD pad Compression Wrap: Profore Lite LF 3 Multilayer Compression Bandaging System 1 x Per Week/30 Days Discharge Instructions: Apply 3 multi-layer wrap as prescribed. 1. Would recommend currently that we going continue with the wound care measures as before. That we have been using the [facility] and I think this is doing an awesome job. 2. I am also can recommend that we have the patient continue to elevate his legs much as  possible were also using the 3 layer compression wrap and I think that is doing a great job for him. 3. I would also suggest that he continue to monitor for any signs of worsening or infection if anything changes he should let  us know as soon as possible. We will see patient back for reevaluation in 1 week here in the clinic. If anything worsens or changes patient will contact our office for additional recommendations. Electronic Signature(s) Signed: 05/24/2021 10:09:36 AM By: Worthy Keeler PA-C Entered By: Worthy Keeler on 05/24/2021 10:09:36 [name] ([id number]) -------------------------------------------------------------------------------- SuperBill Details Patient Name: [name] Date of Service: 05/24/2021 Medical Record Number: [id number] Patient Account Number: [id number] Date of Birth/Sex: Mar 29, 1951 (70 y.o. 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 exposed Z89.421 Acquired absence of other right toe(s) I10 Essential (primary) hypertension Z79.01 Long term (current) use of anticoagulants Z86.718 Personal history of other venous thrombosis and embolism Facility Procedures CPT4 Code: [id number] Description: 23953 - DEB SUBQ TISSUE 20 SQ CM/< Modifier: Quantity: 1 CPT4 Code: Description: ICD-10 Diagnosis Description L97.512 Non-pressure chronic ulcer of other part of right foot with fat layer ex Modifier: posed Quantity: Physician Procedures CPT4 Code: 2023343 Description: 11042 - WC PHYS SUBQ TISS 20 SQ CM Modifier: Quantity: 1 CPT4 Code: Description:  ICD-10 Diagnosis Description L97.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

## 2021-05-31 ENCOUNTER — Encounter: Payer: Medicare HMO | Admitting: Physician Assistant

## 2021-05-31 ENCOUNTER — Other Ambulatory Visit: Payer: Self-pay

## 2021-05-31 DIAGNOSIS — E11621 Type 2 diabetes mellitus with foot ulcer: Secondary | ICD-10-CM | POA: Diagnosis not present

## 2021-05-31 NOTE — Progress Notes (Signed)
[name] ([id number]) Visit Report for 05/31/2021 Arrival Information Details Patient Name: [name], [name] Date of Service: 05/31/2021 10:45 AM Medical Record Number: [id number] Patient Account Number: [id number] Date of Birth/Sex: [birth year] (70 y.o. M) Treating RN: Dolan Amen Primary Care Sarvesh Meddaugh: Beverlyn Roux Other Clinician: Referring Kinza Gouveia: Beverlyn Roux Treating Christino Mcglinchey/Extender: Skipper Cliche in Treatment: 4 Visit Information History Since Last Visit Pain Present Now: No Patient Arrived: Walker Arrival Time: 11:12 Accompanied By: self Transfer Assistance: None Patient Identification Verified: Yes Secondary Verification Process Completed: Yes Electronic Signature(s) Signed: 05/31/2021 1:33:06 PM By: Dolan Amen RN Entered By: Dolan Amen on 05/31/2021 11:12:31 [name] ([id number]) -------------------------------------------------------------------------------- Clinic Level of Care Assessment Details Patient Name: [name] Date of Service: 05/31/2021 10:45 AM Medical Record Number: [id number] Patient Account Number: [id number] Date of Birth/Sex: 08/04/1951 (70 y.o. M) Treating RN: Dolan Amen Primary Care Ashanti Ratti: Beverlyn Roux Other Clinician: Referring Tiffny Gemmer: Beverlyn Roux Treating Isais Klipfel/Extender: Skipper Cliche in Treatment: 4 Clinic Level of Care Assessment Items TOOL 1 Quantity Score []  - Use when EandM and Procedure is performed on INITIAL visit 0 ASSESSMENTS - Nursing Assessment / Reassessment []  - General Physical Exam (combine w/ comprehensive assessment (listed just below) when performed on new 0 pt. evals) []  - 0 Comprehensive Assessment (HX, ROS, Risk Assessments, Wounds Hx, etc.) ASSESSMENTS - Wound and Skin Assessment / Reassessment []  - Dermatologic / Skin Assessment (not related to wound area) 0 ASSESSMENTS - Ostomy and/or Continence Assessment and Care []  - Incontinence Assessment and  Management 0 []  - 0 Ostomy Care Assessment and Management (repouching, etc.) PROCESS - Coordination of Care []  - Simple Patient / Family Education for ongoing care 0 []  - 0 Complex (extensive) Patient / Family Education for ongoing care []  - 0 Staff obtains Programmer, systems, Records, Test Results / Process Orders []  - 0 Staff telephones HHA, Nursing Homes / Clarify orders / etc []  - 0 Routine Transfer to another Facility (non-emergent condition) []  - 0 Routine Hospital Admission (non-emergent condition) []  - 0 New Admissions / Biomedical engineer / Ordering NPWT, Apligraf, etc. []  - 0 Emergency Hospital Admission (emergent condition) PROCESS - Special Needs []  - Pediatric / Minor Patient Management 0 []  - 0 Isolation Patient Management []  - 0 Hearing / Language / Visual special needs []  - 0 Assessment of Community assistance (transportation, D/C planning, etc.) []  - 0 Additional assistance / Altered mentation []  - 0 Support Surface(s) Assessment (bed, cushion, seat, etc.) INTERVENTIONS - Miscellaneous []  - External ear exam 0 []  - 0 Patient Transfer (multiple staff / Civil Service fast streamer / Similar devices) []  - 0 Simple Staple / Suture removal (25 or less) []  - 0 Complex Staple / Suture removal (26 or more) []  - 0 Hypo/Hyperglycemic Management (do not check if billed separately) []  - 0 Ankle / Brachial Index (ABI) - do not check if billed separately Has the patient been seen at the hospital within the last three years: Yes Total Score: 0 Level Of Care: ____ [name] ([id number]) Electronic Signature(s) Signed: 05/31/2021 1:33:06 PM By: Dolan Amen RN Entered By: Dolan Amen on 05/31/2021 11:41:01 [name] ([id number]) -------------------------------------------------------------------------------- Compression Therapy Details Patient Name: [name] Date of Service: 05/31/2021 10:45 AM Medical Record Number: [id number] Patient Account  Number: [id number] Date of Birth/Sex: [birth year] (70 y.o. M) Treating RN: Dolan Amen Primary Care Izel Hochberg: Beverlyn Roux Other Clinician: Referring Krisi Azua: Beverlyn Roux Treating Aerianna Losey/Extender: Skipper Cliche in Treatment: 4 Compression Therapy Performed  for Wound Assessment: Wound #1 Right,Proximal,Lateral Foot Performed By: Clinician Dolan Amen, RN Compression Type: Three Layer Post Procedure Diagnosis Same as Pre-procedure Electronic Signature(s) Signed: 05/31/2021 1:33:06 PM By: Dolan Amen RN Entered By: Dolan Amen on 05/31/2021 11:40:16 [name] ([id number]) -------------------------------------------------------------------------------- Compression Therapy Details Patient Name: [name] Date of Service: 05/31/2021 10:45 AM Medical Record Number: [id number] Patient Account Number: [id number] Date of Birth/Sex: Mar 14, 1951 (70 y.o. M) Treating RN: Dolan Amen Primary Care Madine Sarr: Beverlyn Roux Other Clinician: Referring Eleanor Dimichele: Beverlyn Roux Treating Bernadine Melecio/Extender: Skipper Cliche in Treatment: 4 Compression Therapy Performed for Wound Assessment: Wound #2 Right,Distal,Lateral Foot Performed By: Cora Daniels, RN Compression Type: Three Layer Post Procedure Diagnosis Same as Pre-procedure Electronic Signature(s) Signed: 05/31/2021 1:33:06 PM By: Dolan Amen RN Entered By: Dolan Amen on 05/31/2021 11:40:16 [name] ([id number]) -------------------------------------------------------------------------------- Encounter Discharge Information Details Patient Name: [name] Date of Service: 05/31/2021 10:45 AM Medical Record Number: [id number] Patient Account Number: [id number] Date of Birth/Sex: 1951-07-25 (70 y.o. M) Treating RN: Dolan Amen Primary Care Namari Breton: Beverlyn Roux Other Clinician: Referring Minetta Krisher: Beverlyn Roux Treating Jahsir Rama/Extender: Skipper Cliche in  Treatment: 4 Encounter Discharge Information Items Post Procedure Vitals Discharge Condition: Stable Temperature (F): 98.1 Ambulatory Status: Walker Pulse (bpm): 76 Discharge Destination: Home Respiratory Rate (breaths/min): 18 Transportation: Private Auto Blood Pressure (mmHg): 136/74 Accompanied By: self Schedule Follow-up Appointment: Yes Clinical Summary of Care: Electronic Signature(s) Signed: 05/31/2021 1:33:06 PM By: Dolan Amen RN Entered By: Dolan Amen on 05/31/2021 12:39:37 [name] ([id number]) -------------------------------------------------------------------------------- Lower Extremity Assessment Details Patient Name: [name] Date of Service: 05/31/2021 10:45 AM Medical Record Number: [id number] Patient Account Number: [id number] Date of Birth/Sex: [birth year] (71 y.o. M) Treating RN: Dolan Amen Primary Care Ezrie Bunyan: Beverlyn Roux Other Clinician: Referring Datron Brakebill: Beverlyn Roux Treating Markeshia Giebel/Extender: Skipper Cliche in Treatment: 4 Edema Assessment Assessed: [Left: No] [Right: Yes] Edema: [Left: N] [Right: o] Calf Left: Right: Point of Measurement: From Medial Instep 41.5 cm Ankle Left: Right: Point of Measurement: From Medial Instep 26 cm Vascular Assessment Pulses: Dorsalis Pedis Palpable: [Right:Yes] Electronic Signature(s) Signed: 05/31/2021 1:33:06 PM By: Dolan Amen RN Entered By: Dolan Amen on 05/31/2021 11:24:10 [name] ([id number]) -------------------------------------------------------------------------------- Multi Wound Chart Details Patient Name: [name] Date of Service: 05/31/2021 10:45 AM Medical Record Number: [id number] Patient Account Number: [id number] Date of Birth/Sex: 12/24/1950 (70 y.o. M) Treating RN: Dolan Amen Primary Care Deijah Spikes: Beverlyn Roux Other Clinician: Referring Taren Dymek: Beverlyn Roux Treating Janay Canan/Extender: Skipper Cliche in  Treatment: 4 Vital Signs Height(in): 40 Pulse(bpm): 33 Weight(lbs): 255 Blood Pressure(mmHg): 136/74 Body Mass Index(BMI): 36 Temperature(F): 98.1 Respiratory Rate(breaths/min): 18 Photos: [N/A:N/A] Wound Location: Right, Proximal, Lateral Foot Right, Distal, Lateral Foot N/A Wounding Event: Gradually Appeared Gradually Appeared N/A Primary Etiology: Diabetic Wound/Ulcer of the Lower Diabetic Wound/Ulcer of the Lower N/A Extremity Extremity Comorbid History: Cataracts, Lymphedema, Deep Vein Cataracts, Lymphedema, Deep Vein N/A Thrombosis, Hypertension, Type II Thrombosis, Hypertension, Type II Diabetes, Osteoarthritis, Diabetes, Osteoarthritis, Osteomyelitis Osteomyelitis Date Acquired: 05/03/2020 05/03/2020 N/A Weeks of Treatment: 4 4 N/A Wound Status: Open Open N/A Measurements L x W x D (cm) 1.5x1.8x0.2 1.3x0.9x0.1 N/A Area (cm) : 2.121 0.919 N/A Volume (cm) : 0.424 0.092 N/A % Reduction in Area: 28.90% 62.50% N/A % Reduction in Volume: 76.30% 81.20% N/A Classification: Grade 2 Grade 2 N/A Exudate Amount: Medium Medium N/A Exudate Type: Serosanguineous Serosanguineous N/A Exudate Color: red, brown red, brown N/A Granulation Amount:  Large (67-100%) Large (67-100%) N/A Granulation Quality: Red, Pink Red N/A Necrotic Amount: Small (1-33%) Small (1-33%) N/A Exposed Structures: Fat Layer (Subcutaneous Tissue): Fat Layer (Subcutaneous Tissue): N/A Yes Yes Fascia: No Fascia: No Tendon: No Tendon: No Muscle: No Muscle: No Joint: No Joint: No Bone: No Bone: No Epithelialization: Small (1-33%) Small (1-33%) N/A Treatment Notes Electronic Signature(s) Signed: 05/31/2021 1:33:06 PM By: Dolan Amen RN Entered By: Dolan Amen on 05/31/2021 11:35:47 [name] ([id number]) -------------------------------------------------------------------------------- [place] Details Patient Name: [name] Date of Service: 05/31/2021 10:45  AM Medical Record Number: [id number] Patient Account Number: [id number] Date of Birth/Sex: 1951/05/12 (70 y.o. M) Treating RN: Dolan Amen Primary Care Damante Spragg: Beverlyn Roux Other Clinician: Referring Rayshad Riviello: Beverlyn Roux Treating Kaila Devries/Extender: Skipper Cliche in Treatment: 4 Active Inactive Abuse / Safety / Falls / Self Care Management Nursing Diagnoses: Potential for falls Potential for injury related to falls Goals: Patient will not experience any injury related to falls Date Initiated: 05/03/2021 Target Resolution Date: 06/02/2021 Goal Status: Active Patient will remain injury free related to falls Date Initiated: 05/03/2021 Target Resolution Date: 06/02/2021 Goal Status: Active Patient/caregiver will verbalize understanding of skin care regimen Date Initiated: 05/03/2021 Target Resolution Date: 05/03/2021 Goal Status: Active Interventions: Call light and/or bell within patient's reach Podiatry chair, stretcher in low position and side rails up as needed Assess Activities of Daily Living upon admission and as needed Assess fall risk on admission and as needed Assess: immobility, friction, shearing, incontinence upon admission and as needed Assess impairment of mobility on admission and as needed per policy Notes: Necrotic Tissue Nursing Diagnoses: Impaired tissue integrity related to necrotic/devitalized tissue Knowledge deficit related to management of necrotic/devitalized tissue Goals: Necrotic/devitalized tissue will be minimized in the wound bed Date Initiated: 05/03/2021 Target Resolution Date: 05/03/2021 Goal Status: Active Patient/caregiver will verbalize understanding of reason and process for debridement of necrotic tissue Date Initiated: 05/03/2021 Target Resolution Date: 05/03/2021 Goal Status: Active Interventions: Assess patient pain level pre-, during and post procedure and prior to discharge Provide education on necrotic tissue and debridement  process Treatment Activities: Apply topical anesthetic as ordered : 05/03/2021 Biologic debridement : 05/03/2021 Enzymatic debridement : 05/03/2021 Excisional debridement : 05/03/2021 Notes: BASSAM, DRESCH ([id number]) Wound/Skin Impairment Nursing Diagnoses: Impaired tissue integrity Goals: Patient/caregiver will verbalize understanding of skin care regimen Date Initiated: 05/03/2021 Target Resolution Date: 05/03/2021 Goal Status: Active Ulcer/skin breakdown will have a volume reduction of 30% by week 4 Date Initiated: 05/03/2021 Target Resolution Date: 06/02/2021 Goal Status: Active Ulcer/skin breakdown will have a volume reduction of 50% by week 8 Date Initiated: 05/03/2021 Target Resolution Date: 07/03/2021 Goal Status: Active Ulcer/skin breakdown will have a volume reduction of 80% by week 12 Date Initiated: 05/03/2021 Target Resolution Date: 08/02/2021 Goal Status: Active Ulcer/skin breakdown will heal within 14 weeks Date Initiated: 05/03/2021 Target Resolution Date: 09/02/2021 Goal Status: Active Interventions: Assess patient/caregiver ability to obtain necessary supplies Assess patient/caregiver ability to perform ulcer/skin care regimen upon admission and as needed Assess ulceration(s) every visit Provide education on ulcer and skin care Treatment Activities: Referred to DME Deliliah Spranger for dressing supplies : 05/03/2021 Skin care regimen initiated : 05/03/2021 Notes: Electronic Signature(s) Signed: 05/31/2021 1:33:06 PM By: Dolan Amen RN Entered By: Dolan Amen on 05/31/2021 11:35:41 [name] ([id number]) -------------------------------------------------------------------------------- Pain Assessment Details Patient Name: [name] Date of Service: 05/31/2021 10:45 AM Medical Record Number: [id number] Patient Account Number: [id number] Date of Birth/Sex: 05-23-1951 (70 y.o. M) Treating RN:  Dolan Amen Primary Care Cloe Sockwell: Beverlyn Roux Other  Clinician: Referring Temesgen Weightman: Beverlyn Roux Treating Loranzo Desha/Extender: Skipper Cliche in Treatment: 4 Active Problems Location of Pain Severity and Description of Pain Patient Has Paino No Site Locations Pain Management and Medication Current Pain Management: Electronic Signature(s) Signed: 05/31/2021 1:33:06 PM By: Dolan Amen RN Entered By: Dolan Amen on 05/31/2021 11:13:27 [name] ([id number]) -------------------------------------------------------------------------------- Patient/Caregiver Education Details Patient Name: [name] Date of Service: 05/31/2021 10:45 AM Medical Record Number: [id number] Patient Account Number: [id number] Date of Birth/Gender: 01/14/1951 (70 y.o. M) Treating RN: Dolan Amen Primary Care Physician: Beverlyn Roux Other Clinician: Referring Physician: Beverlyn Roux Treating Physician/Extender: Skipper Cliche in Treatment: 4 Education Assessment Education Provided To: Patient Education Topics Provided Wound Debridement: Methods: Explain/Verbal Responses: State content correctly Wound/Skin Impairment: Methods: Explain/Verbal Responses: State content correctly Electronic Signature(s) Signed: 05/31/2021 1:33:06 PM By: Dolan Amen RN Entered By: Dolan Amen on 05/31/2021 12:37:27 [name] ([id number]) -------------------------------------------------------------------------------- Wound Assessment Details Patient Name: [name] Date of Service: 05/31/2021 10:45 AM Medical Record Number: [id number] Patient Account Number: [id number] Date of Birth/Sex: 08-22-1951 (70 y.o. M) Treating RN: Dolan Amen Primary Care Aylan Bayona: Beverlyn Roux Other Clinician: Referring Valincia Touch: Beverlyn Roux Treating Wise Fees/Extender: Skipper Cliche in Treatment: 4 Wound Status Wound Number: 1 Primary Diabetic Wound/Ulcer of the Lower Extremity Etiology: Wound Location: Right, Proximal, Lateral  Foot Wound Open Wounding Event: Gradually Appeared Status: Date Acquired: 05/03/2020 Comorbid Cataracts, Lymphedema, Deep Vein Thrombosis, Weeks Of Treatment: 4 History: Hypertension, Type II Diabetes, Osteoarthritis, Clustered Wound: No Osteomyelitis Photos Wound Measurements Length: (cm) 1.5 Width: (cm) 1.8 Depth: (cm) 0.2 Area: (cm) 2.121 Volume: (cm) 0.424 % Reduction in Area: 28.9% % Reduction in Volume: 76.3% Epithelialization: Small (1-33%) Tunneling: No Undermining: No Wound Description Classification: Grade 2 Exudate Amount: Medium Exudate Type: Serosanguineous Exudate Color: red, brown Foul Odor After Cleansing: No Slough/Fibrino Yes Wound Bed Granulation Amount: Large (67-100%) Exposed Structure Granulation Quality: Red, Pink Fascia Exposed: No Necrotic Amount: Small (1-33%) Fat Layer (Subcutaneous Tissue) Exposed: Yes Necrotic Quality: Adherent Slough Tendon Exposed: No Muscle Exposed: No Joint Exposed: No Bone Exposed: No Treatment Notes Wound #1 (Foot) Wound Laterality: Right, Lateral, Proximal Cleanser Wound Cleanser Discharge Instruction: Wash your hands with soap and water. Remove old dressing, discard into plastic bag and place into trash. Cleanse the wound with Wound Cleanser prior to applying a clean dressing using gauze sponges, not tissues or cotton balls. Do not scrub or use excessive force. Pat dry using gauze sponges, not tissue or cotton balls. JHOAN, SCHMIEDER ([id number]) Peri-Wound Care Topical Primary Dressing Hydrofera Blue Ready Transfer Foam, 2.5x2.5 (in/in) Discharge Instruction: Apply Hydrofera Blue Ready to wound bed as directed Secondary Dressing ABD Pad 5x9 (in/in) Discharge Instruction: Cover with ABD pad Secured With Compression Wrap Profore Lite LF 3 Multilayer Compression [place] Discharge Instruction: Apply 3 multi-layer wrap as prescribed. Compression Stockings Add-Ons Electronic Signature(s) Signed:  05/31/2021 1:33:06 PM By: Dolan Amen RN Entered By: Dolan Amen on 05/31/2021 11:23:06 [name] ([id number]) -------------------------------------------------------------------------------- Wound Assessment Details Patient Name: [name] Date of Service: 05/31/2021 10:45 AM Medical Record Number: [id number] Patient Account Number: [id number] Date of Birth/Sex: 05/22/1951 (70 y.o. M) Treating RN: Dolan Amen Primary Care Tannya Gonet: Beverlyn Roux Other Clinician: Referring Deyjah Kindel: Beverlyn Roux Treating Lovell Nuttall/Extender: Skipper Cliche in Treatment: 4 Wound Status Wound Number: 2 Primary Diabetic Wound/Ulcer of the Lower Extremity Etiology: Wound Location: Right, Distal, Lateral Foot Wound Open  Wounding Event: Gradually Appeared Status: Date Acquired: 05/03/2020 Comorbid Cataracts, Lymphedema, Deep Vein Thrombosis, Weeks Of Treatment: 4 History: Hypertension, Type II Diabetes, Osteoarthritis, Clustered Wound: No Osteomyelitis Photos Wound Measurements Length: (cm) 1.3 Width: (cm) 0.9 Depth: (cm) 0.1 Area: (cm) 0.919 Volume: (cm) 0.092 % Reduction in Area: 62.5% % Reduction in Volume: 81.2% Epithelialization: Small (1-33%) Tunneling: No Undermining: No Wound Description Classification: Grade 2 Exudate Amount: Medium Exudate Type: Serosanguineous Exudate Color: red, brown Foul Odor After Cleansing: No Slough/Fibrino Yes Wound Bed Granulation Amount: Large (67-100%) Exposed Structure Granulation Quality: Red Fascia Exposed: No Necrotic Amount: Small (1-33%) Fat Layer (Subcutaneous Tissue) Exposed: Yes Necrotic Quality: Adherent Slough Tendon Exposed: No Muscle Exposed: No Joint Exposed: No Bone Exposed: No Treatment Notes Wound #2 (Foot) Wound Laterality: Right, Lateral, Distal Cleanser [place] Ancillary Kit - 15 Day Supply Discharge Instruction: Use supplies as instructed; Kit contains: (15) Saline Bullets; (15) 3x3 Gauze; 15 pr  Gloves Wound Cleanser LESSLIE, MCKEEHAN ([id number]) Discharge Instruction: Wash your hands with soap and water. Remove old dressing, discard into plastic bag and place into trash. Cleanse the wound with Wound Cleanser prior to applying a clean dressing using gauze sponges, not tissues or cotton balls. Do not scrub or use excessive force. Pat dry using gauze sponges, not tissue or cotton balls. Peri-Wound Care Topical Primary Dressing Hydrofera Blue Ready Transfer Foam, 2.5x2.5 (in/in) Discharge Instruction: Apply Hydrofera Blue Ready to wound bed as directed Secondary Dressing ABD Pad 5x9 (in/in) Discharge Instruction: Cover with ABD pad Secured With Compression Wrap Profore Lite LF 3 Multilayer Compression [place] Discharge Instruction: Apply 3 multi-layer wrap as prescribed. Compression Stockings Add-Ons Electronic Signature(s) Signed: 05/31/2021 1:33:06 PM By: Dolan Amen RN Entered By: Dolan Amen on 05/31/2021 11:23:31 [name] ([id number]) -------------------------------------------------------------------------------- Vitals Details Patient Name: [name] Date of Service: 05/31/2021 10:45 AM Medical Record Number: [id number] Patient Account Number: [id number] Date of Birth/Sex: 1951/07/07 (70 y.o. M) Treating RN: Dolan Amen Primary Care Jenafer Winterton: Beverlyn Roux Other Clinician: Referring Nayra Coury: Beverlyn Roux Treating Jedrek Dinovo/Extender: Skipper Cliche in Treatment: 4 Vital Signs Time Taken: 11:13 Temperature (F): 98.1 Height (in): 71 Pulse (bpm): 76 Weight (lbs): 255 Respiratory Rate (breaths/min): 18 Body Mass Index (BMI): 35.6 Blood Pressure (mmHg): 136/74 Reference Range: 80 - 120 mg / dl Electronic Signature(s) Signed: 05/31/2021 1:33:06 PM By: Dolan Amen RN Entered By: Dolan Amen on 05/31/2021 11:13:13

## 2021-05-31 NOTE — Progress Notes (Addendum)
COOPER, MORONEY ([id number]) Visit Report for 05/31/2021 Chief Complaint Document Details Patient Name: [name], [name]. Date of Service: 05/31/2021 10:45 AM Medical Record Number: [id number] Patient Account Number: [id number] Date of Birth/Sex: 10-Jan-1951 (70 y.o. M) Treating RN: Dolan Amen Primary Care Provider: Beverlyn Roux Other Clinician: Referring Provider: Beverlyn Roux Treating Provider/Extender: Skipper Cliche in Treatment: 4 Information Obtained from: Patient Chief Complaint Bilateral foot ulcers Electronic Signature(s) Signed: 05/31/2021 11:30:59 AM By: Worthy Keeler PA-C Entered By: Worthy Keeler on 05/31/2021 11:30:58 [name] ([id number]) -------------------------------------------------------------------------------- Debridement Details Patient Name: [name] Date of Service: 05/31/2021 10:45 AM Medical Record Number: [id number] Patient Account Number: [id number] Date of Birth/Sex: [birth year] (70 y.o. M) Treating RN: Dolan Amen Primary Care Provider: Beverlyn Roux Other Clinician: Referring Provider: Beverlyn Roux Treating Provider/Extender: Skipper Cliche in Treatment: 4 Debridement Performed for Wound #2 Right,Distal,Lateral Foot Assessment: Performed By: Physician Tommie Sams., PA-C Debridement Type: Debridement Severity of Tissue Pre Debridement: Fat layer exposed Level of Consciousness (Pre- Awake and Alert procedure): Pre-procedure Verification/Time Out Yes - 11:36 Taken: Start Time: 11:36 Total Area Debrided (L x W): 1.3 (cm) x 0.9 (cm) = 1.17 (cm) Tissue and other material Viable, Non-Viable, Slough, Subcutaneous, Biofilm, Slough debrided: Level: Skin/Subcutaneous Tissue Debridement Description: Excisional Instrument: Curette Bleeding: Minimum Hemostasis Achieved: Pressure Response to Treatment: Procedure was tolerated well Level of Consciousness (Post- Awake and Alert procedure): Post Debridement  Measurements of Total Wound Length: (cm) 1.3 Width: (cm) 0.9 Depth: (cm) 0.2 Volume: (cm) 0.184 Character of Wound/Ulcer Post Debridement: Stable Severity of Tissue Post Debridement: Fat layer exposed Post Procedure Diagnosis Same as Pre-procedure Electronic Signature(s) Signed: 05/31/2021 1:33:06 PM By: Dolan Amen RN Signed: 05/31/2021 3:01:26 PM By: Worthy Keeler PA-C Entered By: Dolan Amen on 05/31/2021 11:36:27 [name] ([id number]) -------------------------------------------------------------------------------- Debridement Details Patient Name: [name] Date of Service: 05/31/2021 10:45 AM Medical Record Number: [id number] Patient Account Number: [id number] Date of Birth/Sex: 1951/07/22 (70 y.o. M) Treating RN: Dolan Amen Primary Care Provider: Beverlyn Roux Other Clinician: Referring Provider: Beverlyn Roux Treating Provider/Extender: Skipper Cliche in Treatment: 4 Debridement Performed for Wound #1 Right,Proximal,Lateral Foot Assessment: Performed By: Physician Tommie Sams., PA-C Debridement Type: Debridement Severity of Tissue Pre Debridement: Fat layer exposed Level of Consciousness (Pre- Awake and Alert procedure): Pre-procedure Verification/Time Out Yes - 11:36 Taken: Start Time: 11:36 Total Area Debrided (L x W): 1.5 (cm) x 1.8 (cm) = 2.7 (cm) Tissue and other material Viable, Non-Viable, Slough, Subcutaneous, Biofilm, Slough debrided: Level: Skin/Subcutaneous Tissue Debridement Description: Excisional Instrument: Curette Bleeding: Moderate Hemostasis Achieved: Silver Nitrate Response to Treatment: Procedure was tolerated well Level of Consciousness (Post- Awake and Alert procedure): Post Debridement Measurements of Total Wound Length: (cm) 1.5 Width: (cm) 1.8 Depth: (cm) 0.2 Volume: (cm) 0.424 Character of Wound/Ulcer Post Debridement: Stable Severity of Tissue Post Debridement: Fat layer exposed Post  Procedure Diagnosis Same as Pre-procedure Notes 1 silver nitrate stick used Electronic Signature(s) Signed: 05/31/2021 1:33:06 PM By: Dolan Amen RN Signed: 05/31/2021 3:01:26 PM By: Worthy Keeler PA-C Entered By: Dolan Amen on 05/31/2021 11:39:49 [name] ([id number]) -------------------------------------------------------------------------------- HPI Details Patient Name: [name] Date of Service: 05/31/2021 10:45 AM Medical Record Number: [id number] Patient Account Number: [id number] Date of Birth/Sex: 1950/12/18 (70 y.o. 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 Has been seen by [facility]iatry [facility] for a number of years it sounds like at least 4 years. With that being said they have tried a split thickness skin graft along with multiple other topical remedies over the time they have been seeing him over the past several years. Unfortunately they are not able to get this closed I did refer him to [place] for further evaluation and treatment. The patient tells me has been quite sometime since he remembers having an x-ray and again we could not find anything recent when looking through his chart. Fortunately there does not appear to be any signs of active infection systemically which is great news. No fevers, chills, nausea, vomiting, or diarrhea. The patient does have diabetes with his most recent hemoglobin A1c being November 14, 2020 at 9.1. He also does have evidence of lymphedema based on  what I am seeing today. 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 05/10/2021 upon evaluation today patient appears to be doing well with regard to his wounds in general. I feel like he is making progress. Fortunately the compression wrap did well for him and overall I am extremely happy with where we stand. No fevers, chills, nausea, vomiting, or diarrhea. 05/17/2021 upon evaluation today patient appears to be doing better in general regard to his wounds. Overall I feel like he is actually signs of good improvement which is great news and I think that we are headed in the right direction. There does not appear to be any signs of active infection at this time. No fevers, chills, nausea, vomiting, or diarrhea. 05/24/2021 upon evaluation today patient appears to be doing well with regard to his wounds. He is actually showing signs of improvement this is great news. Its mainly the right foot proximal wound that is going to require debridement today and again I do need to try to clear away some of the lip where he had some epiboly I then work on this week by week but if we get that cleared away I think this will heal much more effectively and quickly. 05/31/2021 upon evaluation today patient appears to be doing well at this point in regard to his wounds. Both are showing signs of improvement and very pleased in that regard. Fortunately there does not appear to be any evidence of active infection systemically which is great news. No fevers, chills, nausea, vomiting, or diarrhea. Electronic Signature(s) Signed: 05/31/2021 2:35:18 PM By: Worthy Keeler PA-C Entered By: Worthy Keeler on 05/31/2021 14:35:17 [name] ([id number]) -------------------------------------------------------------------------------- Physical Exam Details Patient Name:  CHUNG, CHAGOYA Date of Service: 05/31/2021 10:45 AM Medical Record Number: [id number] Patient Account Number: [id number] Date of Birth/Sex: 05-Aug-1951 (71 y.o. M) Treating RN: Dolan Amen Primary Care Provider: Beverlyn Roux Other Clinician: Referring Provider: Beverlyn Roux Treating Provider/Extender: Skipper Cliche in Treatment: 4 Constitutional Well-nourished and well-hydrated in no acute distress. Respiratory normal breathing without difficulty. Psychiatric this patient is able to make decisions and demonstrates good insight into disease process. Alert and Oriented x 3. pleasant and cooperative. Notes Upon  inspection patient's wound bed showed signs of good granulation epithelization there was some slough and biofilm noted sharp debridement was performed and my hope is that this will actually continue to allow the wounds to heal much more effectively and quickly. Electronic Signature(s) Signed: 05/31/2021 2:35:33 PM By: Worthy Keeler PA-C Entered By: Worthy Keeler on 05/31/2021 14:35:33 [name] ([id number]) -------------------------------------------------------------------------------- Physician Orders Details Patient Name: [name] Date of Service: 05/31/2021 10:45 AM Medical Record Number: [id number] Patient Account Number: [id number] Date of Birth/Sex: 1950-12-13 (70 y.o. 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 o Nurse Visit as needed - Call for nurse visit IF compression wrap: 1. Gets wet 2. Slides down to about a third of the leg or 3. Gets torn or messed up [employer] o May shower with wound dressing protected with water repellent cover or cast protector. o No tub bath. Edema Control - Lymphedema / Segmental Compressive Device / Other Right Lower Extremity o Optional: One layer of unna paste to top of compression wrap (to act as an anchor). o Elevate legs to the level of the heart and pump ankles as often as possible o Elevate leg(s) parallel to the floor when sitting. o Compression Pump: Use compression pump on left lower extremity for 60 minutes, twice daily. o Compression Pump: Use compression pump on right lower extremity for 60 minutes, twice daily. Wound Treatment Wound #1 - Foot Wound Laterality: Right, Lateral, Proximal Cleanser: Wound Cleanser 1 x Per Week/30 Days Discharge Instructions: Wash your hands with soap and water. Remove old dressing, discard into plastic bag and place into trash. Cleanse the wound with Wound Cleanser prior to applying a clean dressing using gauze sponges, not tissues or cotton balls. Do not scrub or use excessive force. Pat dry using gauze sponges, not tissue or cotton balls. Primary Dressing: Hydrofera Blue Ready Transfer Foam, 2.5x2.5 (in/in) 1 x Per Week/30 Days Discharge Instructions: Apply Hydrofera Blue Ready to wound bed as directed Secondary Dressing: ABD Pad 5x9 (in/in) 1 x Per Week/30 Days Discharge Instructions: Cover with ABD pad Compression Wrap: Profore Lite LF 3 Multilayer Compression Bandaging System 1 x Per Week/30 Days Discharge Instructions: Apply 3 multi-layer wrap as prescribed. Wound #2 - Foot Wound Laterality: Right, Lateral, Distal Cleanser: Byram  Ancillary Kit - 15 Day Supply (Generic) 1 x Per Week/30 Days Discharge Instructions: Use supplies as instructed; Kit contains: (15) Saline Bullets; (15) 3x3 Gauze; 15 pr Gloves Cleanser: Wound Cleanser 1 x Per Week/30 Days Discharge Instructions: Wash your hands with soap and water. Remove old dressing, discard into plastic bag and place into trash. Cleanse the wound with Wound Cleanser prior to applying a clean dressing using gauze sponges, not tissues or cotton balls. Do not scrub or use excessive force. Pat dry using gauze sponges, not tissue or cotton balls. Primary Dressing: Hydrofera Blue Ready Transfer Foam, 2.5x2.5 (in/in) (Generic) 1 x Per Week/30  Days ANTOWAN, SAMFORD ([id number]) Discharge Instructions: Apply Hydrofera Blue Ready to wound bed as directed Secondary Dressing: ABD Pad 5x9 (in/in) 1 x Per Week/30 Days Discharge Instructions: Cover with ABD pad Compression Wrap: Profore Lite LF 3 Multilayer Compression Bandaging System 1 x Per Week/30 Days Discharge Instructions: Apply 3 multi-layer wrap as prescribed. Electronic Signature(s) Signed: 05/31/2021 1:33:06 PM By: Dolan Amen RN Signed: 05/31/2021 3:01:26 PM By: Worthy Keeler PA-C Entered By: Dolan Amen on 05/31/2021 11:40:53 [name] ([id number]) -------------------------------------------------------------------------------- Problem List Details Patient Name: MARKISE, HAYMER Date of Service: 05/31/2021 10:45 AM Medical Record Number: [id number] Patient Account Number: [id number] Date of Birth/Sex: 03/04/1951 (70 y.o. M) Treating RN: Dolan Amen Primary Care Provider: Beverlyn Roux Other Clinician: Referring Provider: Beverlyn Roux Treating Provider/Extender: Skipper Cliche in Treatment: 4 Active Problems ICD-10 Encounter Code Description Active Date MDM Diagnosis E11.621 Type 2 diabetes mellitus with foot ulcer 05/03/2021 No Yes L97.512 Non-pressure chronic ulcer of other part of right  foot with fat layer 05/03/2021 No Yes exposed L97.522 Non-pressure chronic ulcer of other part of left foot with fat layer 05/03/2021 No Yes exposed Z89.421 Acquired absence of other right toe(s) 05/03/2021 No Yes I10 Essential (primary) hypertension 05/03/2021 No Yes Z79.01 Long term (current) use of anticoagulants 05/03/2021 No Yes Z86.718 Personal history of other venous thrombosis and embolism 05/03/2021 No Yes Inactive Problems Resolved Problems Electronic Signature(s) Signed: 05/31/2021 11:30:52 AM By: Worthy Keeler PA-C Entered By: Worthy Keeler on 05/31/2021 11:30:51 [name] ([id number]) -------------------------------------------------------------------------------- Progress Note Details Patient Name: [name] Date of Service: 05/31/2021 10:45 AM Medical Record Number: [id number] Patient Account Number: [id number] Date of Birth/Sex: [birth year] (70 y.o. 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 Has been seen by [facility]iatry [facility] for a number of years it sounds like at least 4 years. With that being said they have tried a split thickness skin graft along with multiple other topical remedies over the time they have been seeing him over the past several years. Unfortunately they are not able to get this closed I did refer him to [place] for further evaluation and treatment. The patient tells me has been quite sometime since he  remembers having an x-ray and again we could not find anything recent when looking through his chart. Fortunately there does not appear to be any signs of active infection systemically which is great news. No fevers, chills, nausea, vomiting, or diarrhea. The patient does have diabetes with his most recent hemoglobin A1c being November 14, 2020 at 9.1. He also does have evidence of lymphedema based on what I am seeing today. 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 05/10/2021 upon evaluation today patient appears to be doing well with regard to his wounds in general. I feel like he is making progress. Fortunately the compression wrap did well for him  and overall I am extremely happy with where we stand. No fevers, chills, nausea, vomiting, or diarrhea. 05/17/2021 upon evaluation today patient appears to be doing better in general regard to his wounds. Overall I feel like he is actually signs of good improvement which is great news and I think that we are headed in the right direction. There does not appear to be any signs of active infection at this time. No fevers, chills, nausea, vomiting, or diarrhea. 05/24/2021 upon evaluation today patient appears to be doing well with regard to his wounds. He is actually showing signs of improvement this is great news. Its mainly the right foot proximal wound that is going to require debridement today and again I do need to try to clear away some of the lip where he had some epiboly I then work on this week by week but if we get that cleared away I think this will heal much more effectively and quickly. 05/31/2021 upon evaluation today patient appears to be doing well at this point in regard to his wounds. Both are showing signs of improvement and very pleased in that regard. Fortunately there does not appear to  be any evidence of active infection systemically which is great news. No fevers, chills, nausea, vomiting, or diarrhea. Objective Constitutional Well-nourished and well-hydrated in no acute distress. Vitals Time Taken: 11:13 AM, Height: 71 in, Weight: 255 lbs, BMI: 35.6, Temperature: 98.1 F, Pulse: 76 bpm, Respiratory Rate: 18 breaths/min, Blood Pressure: 136/74 mmHg. Respiratory normal breathing without difficulty. Psychiatric this patient is able to make decisions and demonstrates good insight into disease process. Alert and Oriented x 3. pleasant and cooperative. General Notes: Upon inspection patient's wound bed showed signs of good granulation epithelization there was some slough and biofilm noted sharp debridement was performed and my hope is that this will actually continue to allow the wounds to heal much more effectively and quickly. Integumentary (Hair, Skin) Wound #1 status is Open. Original cause of wound was Gradually Appeared. The date acquired was: 05/03/2020. The wound has been in treatment JAYLYNN, SIEFERT. ([id number]) 4 weeks. The wound is located on the Right,Proximal,Lateral Foot. The wound measures 1.5cm length x 1.8cm width x 0.2cm depth; 2.121cm^2 area and 0.424cm^3 volume. There is Fat Layer (Subcutaneous Tissue) exposed. There is no tunneling or undermining noted. There is a medium amount of serosanguineous drainage noted. There is large (67-100%) red, pink granulation within the wound bed. There is a small (1-33%) amount of necrotic tissue within the wound bed including Adherent Slough. Wound #2 status is Open. Original cause of wound was Gradually Appeared. The date acquired was: 05/03/2020. The wound has been in treatment 4 weeks. The wound is located on the Right,Distal,Lateral Foot. The wound measures 1.3cm length x 0.9cm width x 0.1cm depth; 0.919cm^2 area and 0.092cm^3 volume. There is Fat Layer (Subcutaneous Tissue) exposed. There is no tunneling or undermining  noted. There is a medium amount of serosanguineous drainage noted. There is large (67-100%) red granulation within the wound bed. There is a small (1-33%) amount of necrotic tissue within the wound bed including Adherent Slough. 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 There was a Excisional Skin/Subcutaneous Tissue Debridement with a total area of 2.7 sq cm performed by Tommie Sams., PA-C. With the following instrument(s): Curette to remove Viable and Non-Viable tissue/material. Material removed includes Subcutaneous Tissue, Slough, and Biofilm. A time out was conducted at 11:36, prior to the start of the procedure. A Moderate amount of bleeding was controlled with Silver Nitrate. The procedure was tolerated well. Post Debridement Measurements: 1.5cm length x 1.8cm width x 0.2cm depth; 0.424cm^3 volume. Character of Wound/Ulcer Post Debridement is stable. Severity of Tissue Post Debridement is: Fat layer exposed. Post procedure Diagnosis Wound #1: Same as Pre-Procedure General Notes: 1 silver nitrate stick used. Pre-procedure diagnosis of Wound #1 is a Diabetic Wound/Ulcer of the Lower Extremity located on the Right,Proximal,Lateral Foot . There was a Three Layer Compression Therapy Procedure by Dolan Amen, RN. Post procedure Diagnosis Wound #1: Same as Pre-Procedure Wound #2 Pre-procedure diagnosis of Wound #2 is a Diabetic  Wound/Ulcer of the Lower Extremity located on the Right,Distal,Lateral Foot .Severity of Tissue Pre Debridement is: Fat layer exposed. There was a Excisional Skin/Subcutaneous Tissue Debridement with a total area of 1.17 sq cm performed by Tommie Sams., PA-C. With the following instrument(s): Curette to remove Viable and Non-Viable tissue/material. Material removed includes Subcutaneous Tissue, Slough, and Biofilm. A time out was conducted at 11:36, prior to the start of the procedure. A Minimum amount of bleeding was controlled with Pressure. The procedure was tolerated well. Post Debridement Measurements: 1.3cm length x 0.9cm width x 0.2cm depth; 0.184cm^3 volume. Character of Wound/Ulcer Post Debridement is stable. Severity of Tissue Post Debridement is: Fat layer exposed. Post procedure Diagnosis Wound #2: Same as Pre-Procedure Pre-procedure diagnosis of Wound #2 is a Diabetic Wound/Ulcer of the Lower Extremity located on the Right,Distal,Lateral Foot . There was a Three Layer Compression Therapy Procedure by Dolan Amen, RN. Post procedure Diagnosis Wound #2: Same as Pre-Procedure Plan Follow-up Appointments: Return Appointment in 1 week. Nurse Visit as needed - Call for nurse visit IF compression wrap: 1. Gets wet 2. Slides down to about a third of the leg or 3. Gets torn or PRATT, BRESS ([id number]) up Bathing/ Shower/ Hygiene: May shower with wound dressing protected with water repellent cover or cast protector. No tub bath. Edema Control - Lymphedema / Segmental Compressive Device / Other: Optional: One layer of unna paste to top of compression wrap (to act as an anchor). Elevate legs to the level of the heart and pump ankles as often as possible Elevate leg(s) parallel to the floor when sitting. Compression Pump: Use compression pump on left lower extremity for 60 minutes, twice daily. Compression Pump: Use compression pump on right lower extremity for 60 minutes,  twice daily. WOUND #1: - Foot Wound Laterality: Right, Lateral, Proximal Cleanser: Wound Cleanser 1 x Per Week/30 Days Discharge Instructions: Wash your hands with soap and water. Remove old dressing, discard into plastic bag and place into trash. Cleanse the wound with Wound Cleanser prior to applying a clean dressing using gauze sponges, not tissues or cotton balls. Do not scrub or use excessive force. Pat dry using gauze sponges, not tissue or cotton balls. Primary Dressing: Hydrofera Blue Ready Transfer Foam, 2.5x2.5 (in/in) 1 x Per Week/30 Days Discharge Instructions: Apply Hydrofera Blue Ready to wound bed as directed Secondary Dressing: ABD Pad 5x9 (in/in) 1 x Per Week/30 Days Discharge  Instructions: Cover with ABD pad Compression Wrap: Profore Lite LF 3 Multilayer Compression Bandaging System 1 x Per Week/30 Days Discharge Instructions: Apply 3 multi-layer wrap as prescribed. WOUND #2: - Foot Wound Laterality: Right, Lateral, Distal Cleanser: Byram Ancillary Kit - 15 Day Supply (Generic) 1 x Per Week/30 Days Discharge Instructions: Use supplies as instructed; Kit contains: (15) Saline Bullets; (15) 3x3 Gauze; 15 pr Gloves Cleanser: Wound Cleanser 1 x Per Week/30 Days Discharge Instructions: Wash your hands with soap and water. Remove old dressing, discard into plastic bag and place into trash. Cleanse the wound with Wound Cleanser prior to applying a clean dressing using gauze sponges, not tissues or cotton balls. Do not scrub or use excessive force. Pat dry using gauze sponges, not tissue or cotton balls. Primary Dressing: Hydrofera Blue Ready Transfer Foam, 2.5x2.5 (in/in) (Generic) 1 x Per Week/30 Days Discharge Instructions: Apply Hydrofera Blue Ready to wound bed as directed Secondary Dressing: ABD Pad 5x9 (in/in) 1 x Per Week/30 Days Discharge Instructions: Cover with ABD pad Compression Wrap: Profore Lite LF 3 Multilayer Compression Bandaging System 1 x Per Week/30  Days Discharge Instructions: Apply 3 multi-layer wrap as prescribed. 1. Would recommend currently that we going to continue with the wound care measures as before and the patient is in agreement with plan. This includes the use of the [facility] which I do believe is doing an awesome job. 2. I am also going to recommend that we have the patient continue to utilize 3 layer compression wrap which is doing a good job of keeping the edema under good control. We will see patient back for reevaluation in 1 week here in the clinic. If anything worsens or changes patient will contact our office for additional recommendations. Electronic Signature(s) Signed: 05/31/2021 2:36:05 PM By: Worthy Keeler PA-C Entered By: Worthy Keeler on 05/31/2021 14:36:04 [name] ([id number]) -------------------------------------------------------------------------------- SuperBill Details Patient Name: [name] Date of Service: 05/31/2021 Medical Record Number: [id number] Patient Account Number: [id number] Date of Birth/Sex: [birth year] (70 y.o. 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 layer exposed Z89.421 Acquired absence of other right toe(s) I10 Essential (primary) hypertension Z79.01 Long term (current) use of anticoagulants Z86.718 Personal history of other venous thrombosis and embolism Facility Procedures CPT4 Code: 60109323 Description: 55732 - DEB SUBQ TISSUE 20 SQ CM/< Modifier: Quantity: 1 CPT4 Code: Description: ICD-10 Diagnosis Description L97.512 Non-pressure chronic ulcer of other  part of right foot with fat layer ex Modifier: posed Quantity: Physician Procedures CPT4 Code: 2025427 Description: 11042 - WC PHYS SUBQ TISS 20 SQ CM Modifier: Quantity: 1 CPT4 Code: Description: ICD-10 Diagnosis Description L97.512 Non-pressure chronic ulcer of other part of right foot with fat layer ex Modifier: posed Quantity: Electronic Signature(s) Signed: 05/31/2021 2:36:24 PM By: Worthy Keeler PA-C Previous Signature: 05/31/2021 1:33:06 PM Version By: Dolan Amen RN Entered By: Worthy Keeler on 05/31/2021 14:36:24

## 2021-06-07 ENCOUNTER — Other Ambulatory Visit: Payer: Self-pay

## 2021-06-07 ENCOUNTER — Encounter: Payer: Medicare HMO | Attending: Physician Assistant | Admitting: Physician Assistant

## 2021-06-07 DIAGNOSIS — E11621 Type 2 diabetes mellitus with foot ulcer: Secondary | ICD-10-CM | POA: Insufficient documentation

## 2021-06-07 DIAGNOSIS — L97512 Non-pressure chronic ulcer of other part of right foot with fat layer exposed: Secondary | ICD-10-CM | POA: Diagnosis present

## 2021-06-07 DIAGNOSIS — Z89421 Acquired absence of other right toe(s): Secondary | ICD-10-CM | POA: Diagnosis not present

## 2021-06-07 DIAGNOSIS — Z7901 Long term (current) use of anticoagulants: Secondary | ICD-10-CM | POA: Insufficient documentation

## 2021-06-07 DIAGNOSIS — Z86718 Personal history of other venous thrombosis and embolism: Secondary | ICD-10-CM | POA: Diagnosis not present

## 2021-06-07 DIAGNOSIS — I10 Essential (primary) hypertension: Secondary | ICD-10-CM | POA: Insufficient documentation

## 2021-06-07 DIAGNOSIS — L97522 Non-pressure chronic ulcer of other part of left foot with fat layer exposed: Secondary | ICD-10-CM | POA: Diagnosis not present

## 2021-06-07 NOTE — Progress Notes (Addendum)
ROMAIN, ERION ([id number]) Visit Report for 06/07/2021 Chief Complaint Document Details Patient Name: [name], COWHER. Date of Service: 06/07/2021 10:45 AM Medical Record Number: [id number] Patient Account Number: [id number] Date of Birth/Sex: [birth year] (70 y.o. M) Treating RN: Dolan Amen Primary Care Provider: Beverlyn Roux Other Clinician: Referring Provider: Beverlyn Roux Treating Provider/Extender: Skipper Cliche in Treatment: 5 Information Obtained from: Patient Chief Complaint Bilateral foot ulcers Electronic Signature(s) Signed: 06/07/2021 10:53:06 AM By: Worthy Keeler PA-C Entered By: Worthy Keeler on 06/07/2021 10:53:06 [name] ([id number]) -------------------------------------------------------------------------------- Debridement Details Patient Name: [name] Date of Service: 06/07/2021 10:45 AM Medical Record Number: [id number] Patient Account Number: [id number] Date of Birth/Sex: 09-01-1951 (70 y.o. M) Treating RN: Dolan Amen Primary Care Provider: Beverlyn Roux Other Clinician: Referring Provider: Beverlyn Roux Treating Provider/Extender: Skipper Cliche in Treatment: 5 Debridement Performed for Wound #1 Right,Proximal,Lateral Foot Assessment: Performed By: Physician Tommie Sams., PA-C Debridement Type: Debridement Severity of Tissue Pre Debridement: Fat layer exposed Level of Consciousness (Pre- Awake and Alert procedure): Pre-procedure Verification/Time Out Yes - 11:12 Taken: Start Time: 11:12 Total Area Debrided (L x W): 1.4 (cm) x 2 (cm) = 2.8 (cm) Tissue and other material Viable, Non-Viable, Slough, Subcutaneous, Biofilm, Slough debrided: Level: Skin/Subcutaneous Tissue Debridement Description: Excisional Instrument: Curette Bleeding: Minimum Hemostasis Achieved: Pressure Response to Treatment: Procedure was tolerated well Level of Consciousness (Post- Awake and Alert procedure): Post Debridement  Measurements of Total Wound Length: (cm) 1.4 Width: (cm) 2 Depth: (cm) 0.2 Volume: (cm) 0.44 Character of Wound/Ulcer Post Debridement: Stable Severity of Tissue Post Debridement: Fat layer exposed Post Procedure Diagnosis Same as Pre-procedure Electronic Signature(s) Signed: 06/07/2021 4:44:56 PM By: Dolan Amen RN Signed: 06/07/2021 6:01:56 PM By: Worthy Keeler PA-C Entered By: Dolan Amen on 06/07/2021 11:12:41 [name] ([id number]) -------------------------------------------------------------------------------- HPI Details Patient Name: [name] Date of Service: 06/07/2021 10:45 AM Medical Record Number: [id number] Patient Account Number: [id number] Date of Birth/Sex: [birth year] (70 y.o. 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 Has been seen by [facility]iatry [facility] for a number of years it sounds like at least 4 years. With that being said they have tried a split thickness skin graft along with multiple other topical remedies over the time they have been seeing him over the past several years. Unfortunately they are not able to get this closed I did refer him to [place] for further evaluation and treatment. The patient tells me has been quite sometime since he remembers having an x-ray and again we could not find anything recent when looking through his chart. Fortunately there does not appear to be any signs of active infection systemically which is great news. No fevers, chills, nausea,  vomiting, or diarrhea. The patient does have diabetes with his most recent hemoglobin A1c being November 14, 2020 at 9.1. He also does have evidence of lymphedema based on what I am seeing today. 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 05/10/2021 upon evaluation today patient appears to be doing well with regard to his wounds in general. I feel like he is making progress. Fortunately the compression wrap did well for him and overall I am extremely happy with where we stand. No fevers, chills, nausea, vomiting, or diarrhea. 05/17/2021 upon evaluation today patient appears to be doing better in general regard to his wounds. Overall I feel like he is actually signs of good improvement which is great news and I think that we are headed in the right direction. There does not appear to be any signs of active infection at this time. No fevers, chills, nausea, vomiting, or diarrhea. 05/24/2021 upon evaluation today patient appears to be doing well with regard to his wounds. He is actually showing signs of improvement this is great news. Its mainly the right foot proximal wound that is going to require debridement today and again I do need to try to clear away some of the lip where he had some epiboly I then work on this week by week but if we get that cleared away I think this will heal much more effectively and quickly. 05/31/2021 upon evaluation today patient appears to be doing well at this point in regard to his wounds. Both are showing signs of improvement and very pleased in that regard. Fortunately there does not appear to be any evidence of active infection systemically which is great news. No fevers, chills, nausea, vomiting, or diarrhea. 06/07/2021 upon evaluation today patient appears to be making excellent progress in regard to his foot ulcers.  Both are showing signs of excellent improvement. Fortunately there does not appear to be any evidence of active infection which is great news. No fevers, chills, nausea, vomiting, or diarrhea. Electronic Signature(s) Signed: 06/07/2021 11:14:23 AM By: Worthy Keeler PA-C Entered By: Worthy Keeler on 06/07/2021 11:14:23 [name] ([id number]) -------------------------------------------------------------------------------- Physical Exam Details Patient Name: OZAN, MACLAY Date of Service: 06/07/2021 10:45 AM Medical Record Number: [id number] Patient Account Number: [id number] Date of Birth/Sex: 08-12-1951 (70 y.o. M) Treating RN: Dolan Amen Primary Care Provider: Beverlyn Roux Other Clinician: Referring Provider: Beverlyn Roux Treating Provider/Extender: Skipper Cliche in Treatment: 5 Constitutional Well-nourished and well-hydrated in no acute distress. Respiratory normal breathing without difficulty. Psychiatric this patient is able to make decisions and demonstrates good insight into disease process. Alert and Oriented x 3. pleasant and cooperative. Notes Upon inspection patient's wound showed good signs of epithelization and granulation at this point. There does not appear to be any evidence of active infection which is great news and overall I am extremely pleased with where we stand. He did require some sharp debridement in regard to the proximal wound. He tolerated that debridement today without complication and postdebridement the wound bed appears to be doing much better. Electronic Signature(s) Signed: 06/07/2021 11:14:49 AM By: Worthy Keeler PA-C Entered By: Worthy Keeler on 06/07/2021 11:14:48 [name] ([id number]) -------------------------------------------------------------------------------- Physician Orders Details Patient Name: [name] Date of Service: 06/07/2021 10:45 AM Medical Record Number: [id number] Patient Account  Number: [id number] Date of Birth/Sex: 10/13/1950 (70 y.o. 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 - MD visit o Nurse Visit as needed - [place] next Friday Call for nurse visit IF compression wrap: 1. Gets wet 2. Slides down to about a third of the leg or 3. Gets torn or messed up [employer] o May shower with wound dressing protected with water repellent cover or cast protector. o No tub bath. Edema Control - Lymphedema / Segmental Compressive Device / Other Right Lower Extremity o Optional: One layer of unna paste to top of compression wrap (to act as an anchor). o Elevate legs to the level of the heart and pump ankles as often as possible o Elevate leg(s) parallel to the floor when sitting. o Compression Pump: Use compression pump on left lower extremity for 60 minutes, twice daily. o Compression Pump: Use compression pump on right lower extremity for 60 minutes, twice daily. Wound Treatment Wound #1 - Foot Wound Laterality: Right, Lateral, Proximal Cleanser: Wound Cleanser 1 x Per Week/30 Days Discharge Instructions: Wash your hands with soap and water. Remove old dressing, discard into plastic bag and place into trash. Cleanse the wound with Wound Cleanser prior to applying a  clean dressing using gauze sponges, not tissues or cotton balls. Do not scrub or use excessive force. Pat dry using gauze sponges, not tissue or cotton balls. Primary Dressing: Hydrofera Blue Ready Transfer Foam, 2.5x2.5 (in/in) 1 x Per Week/30 Days Discharge Instructions: Apply Hydrofera Blue Ready to wound bed as directed Secondary Dressing: ABD Pad 5x9 (in/in) 1 x Per Week/30 Days Discharge Instructions: Cover with ABD pad Compression Wrap: Profore Lite LF 3 Multilayer Compression Bandaging System 1 x Per Week/30 Days Discharge Instructions: Apply 3 multi-layer wrap as prescribed. Wound #2 - Foot Wound Laterality: Right, Lateral, Distal Cleanser: Byram Ancillary Kit - 15 Day Supply (Generic) 1 x Per Week/30 Days Discharge Instructions: Use supplies as instructed; Kit contains: (15) Saline Bullets; (15) 3x3 Gauze; 15 pr Gloves Cleanser: Wound Cleanser 1 x Per Week/30 Days Discharge Instructions: Wash your hands with soap and water. Remove old dressing, discard into plastic bag and place into trash. Cleanse the wound with Wound Cleanser prior to applying a clean dressing using gauze sponges, not tissues or cotton balls. Do not scrub or use excessive force. Pat dry using gauze sponges, not tissue or cotton balls. Primary Dressing: Hydrofera Blue Ready Transfer Foam, 2.5x2.5 (in/in) (Generic) 1 x Per Week/30 Days ONIX, JUMPER ([id number]) Discharge Instructions: Apply Hydrofera Blue Ready to wound bed as directed Secondary Dressing: ABD Pad 5x9 (in/in) 1 x Per Week/30 Days Discharge Instructions: Cover with ABD pad Compression Wrap: Profore Lite LF 3 Multilayer Compression Bandaging System 1 x Per Week/30 Days Discharge Instructions: Apply 3 multi-layer wrap as prescribed. Electronic Signature(s) Signed: 06/07/2021 4:44:56 PM By: Dolan Amen RN Signed: 06/07/2021 6:01:56 PM By: Worthy Keeler PA-C Entered By: Dolan Amen on 06/07/2021 11:14:27 [name]  ([id number]) -------------------------------------------------------------------------------- Problem List Details Patient Name: RASHI, GIULIANI Date of Service: 06/07/2021 10:45 AM Medical Record Number: [id number] Patient Account Number: [id number] Date of Birth/Sex: Aug 24, 1951 (70 y.o. M) Treating RN: Dolan Amen Primary Care Provider: Beverlyn Roux Other Clinician: Referring Provider: Beverlyn Roux Treating Provider/Extender: Skipper Cliche in Treatment: 5 Active Problems ICD-10 Encounter Code Description Active Date MDM  Diagnosis E11.621 Type 2 diabetes mellitus with foot ulcer 05/03/2021 No Yes L97.512 Non-pressure chronic ulcer of other part of right foot with fat layer 05/03/2021 No Yes exposed L97.522 Non-pressure chronic ulcer of other part of left foot with fat layer 05/03/2021 No Yes exposed Z89.421 Acquired absence of other right toe(s) 05/03/2021 No Yes I10 Essential (primary) hypertension 05/03/2021 No Yes Z79.01 Long term (current) use of anticoagulants 05/03/2021 No Yes Z86.718 Personal history of other venous thrombosis and embolism 05/03/2021 No Yes Inactive Problems Resolved Problems Electronic Signature(s) Signed: 06/07/2021 10:53:01 AM By: Worthy Keeler PA-C Entered By: Worthy Keeler on 06/07/2021 10:53:01 [name] ([id number]) -------------------------------------------------------------------------------- Progress Note Details Patient Name: [name] Date of Service: 06/07/2021 10:45 AM Medical Record Number: [id number] Patient Account Number: [id number] Date of Birth/Sex: 1951/03/26 (70 y.o. 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 Has been seen [facility] for a number of years it sounds like at least 4 years. With that being said they have tried a split thickness skin graft along with multiple other topical remedies over the time they have been seeing him over the past several years. Unfortunately they are not able to get this closed I did refer him to [place] for further evaluation and treatment. The patient tells me has been quite sometime since he remembers having an x-ray and again we could not find anything recent when looking through his chart. Fortunately there does not appear to be any signs of active infection systemically which is great news. No fevers, chills, nausea, vomiting, or diarrhea. The patient does have diabetes with his most recent hemoglobin A1c being November 14, 2020 at 9.1. He also does have evidence of lymphedema based on what I am seeing today. 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 05/10/2021 upon evaluation today patient appears to be doing well with regard to his wounds in general. I feel like he is making progress. Fortunately the compression wrap did well for him and overall I am extremely happy with where we stand. No fevers, chills, nausea, vomiting, or diarrhea. 05/17/2021 upon evaluation today patient appears to be doing better in general regard to his wounds. Overall I feel like he is actually signs of good improvement which is great news and I think that we are headed in the right direction. There does not appear to be any signs of active infection at this time. No fevers, chills, nausea,  vomiting, or diarrhea. 05/24/2021 upon evaluation today patient appears to be doing well with regard to his wounds. He is actually showing signs of improvement this is great news. Its mainly the right foot proximal wound that is going to require debridement today and again I do need to try to clear away some of the lip where he had some epiboly I then work on this week by week but if we  get that cleared away I think this will heal much more effectively and quickly. 05/31/2021 upon evaluation today patient appears to be doing well at this point in regard to his wounds. Both are showing signs of improvement and very pleased in that regard. Fortunately there does not appear to be any evidence of active infection systemically which is great news. No fevers, chills, nausea, vomiting, or diarrhea. 06/07/2021 upon evaluation today patient appears to be making excellent progress in regard to his foot ulcers. Both are showing signs of excellent improvement. Fortunately there does not appear to be any evidence of active infection which is great news. No fevers, chills, nausea, vomiting, or diarrhea. Objective Constitutional Well-nourished and well-hydrated in no acute distress. Vitals Time Taken: 10:52 AM, Height: 71 in, Weight: 255 lbs, BMI: 35.6, Temperature: 98.1 F, Pulse: 83 bpm, Respiratory Rate: 18 breaths/min, Blood Pressure: 118/67 mmHg. Respiratory normal breathing without difficulty. Psychiatric this patient is able to make decisions and demonstrates good insight into disease process. Alert and Oriented x 3. pleasant and cooperative. General Notes: Upon inspection patient's wound showed good signs of epithelization and granulation at this point. There does not appear to be ASHDON, GILLSON. ([id number]) any evidence of active infection which is great news and overall I am extremely pleased with where we stand. He did require some sharp debridement in regard to the proximal wound. He tolerated  that debridement today without complication and postdebridement the wound bed appears to be doing much better. Integumentary (Hair, Skin) Wound #1 status is Open. Original cause of wound was Gradually Appeared. The date acquired was: 05/03/2020. The wound has been in treatment 5 weeks. The wound is located on the Right,Proximal,Lateral Foot. The wound measures 1.4cm length x 2cm width x 0.2cm depth; 2.199cm^2 area and 0.44cm^3 volume. There is Fat Layer (Subcutaneous Tissue) exposed. There is no tunneling or undermining noted. There is a medium amount of serosanguineous drainage noted. There is large (67-100%) red, pink granulation within the wound bed. There is a small (1-33%) amount of necrotic tissue within the wound bed including Adherent Slough. Wound #2 status is Open. Original cause of wound was Gradually Appeared. The date acquired was: 05/03/2020. The wound has been in treatment 5 weeks. The wound is located on the Right,Distal,Lateral Foot. The wound measures 0.7cm length x 0.9cm width x 0.1cm depth; 0.495cm^2 area and 0.049cm^3 volume. There is Fat Layer (Subcutaneous Tissue) exposed. There is no tunneling or undermining noted. There is a medium amount of serosanguineous drainage noted. There is large (67-100%) red granulation within the wound bed. There is a small (1-33%) amount of necrotic tissue within the wound bed including Adherent Slough. 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 There was a Excisional Skin/Subcutaneous Tissue Debridement with a total area of 2.8 sq cm performed by Tommie Sams., PA-C. With the following instrument(s): Curette to remove Viable and Non-Viable tissue/material. Material removed includes Subcutaneous Tissue, Slough, and Biofilm. A time out was conducted at 11:12, prior to the start of the procedure.  A Minimum amount of bleeding was controlled with Pressure. The procedure was tolerated well. Post Debridement Measurements: 1.4cm length x 2cm width x 0.2cm depth; 0.44cm^3 volume. Character of Wound/Ulcer Post Debridement is stable. Severity of Tissue Post Debridement is: Fat layer exposed. Post procedure Diagnosis Wound #1: Same as Pre-Procedure Pre-procedure diagnosis of Wound #1 is a Diabetic Wound/Ulcer of the Lower Extremity located on the Right,Proximal,Lateral Foot . There was a Three Layer Compression Therapy Procedure by Dolan Amen, RN. Post procedure Diagnosis Wound #1: Same as Pre-Procedure Wound #2 Pre-procedure diagnosis of Wound #2 is a Diabetic Wound/Ulcer of the Lower Extremity located on the Right,Distal,Lateral Foot . There was a Three Layer Compression Therapy Procedure by Dolan Amen, RN. Post procedure Diagnosis Wound #2: Same as Pre-Procedure Plan Follow-up Appointments: Return Appointment in 2 weeks. - MD visit Nurse Visit as needed - [place] next Friday Call for nurse visit IF compression wrap: 1. Gets wet 2. Slides down to about a third of the leg or 3. Gets torn or messed up Bathing/ Shower/ Hygiene: May shower with wound dressing protected with water repellent cover or cast protector. No tub bath. EDEN, RHO ([id number]) Edema Control - Lymphedema / Segmental Compressive Device / Other: Optional: One layer of unna paste to top of compression wrap (to act as an anchor). Elevate legs to the level of the heart and pump ankles as often as  possible Elevate leg(s) parallel to the floor when sitting. Compression Pump: Use compression pump on left lower extremity for 60 minutes, twice daily. Compression Pump: Use compression pump on right lower extremity for 60 minutes, twice daily. WOUND #1: - Foot Wound Laterality: Right, Lateral, Proximal Cleanser: Wound Cleanser 1 x Per Week/30 Days Discharge Instructions: Wash your hands with soap and water. Remove old dressing, discard into plastic bag and place into trash. Cleanse the wound with Wound Cleanser prior to applying a clean dressing using gauze sponges, not tissues or cotton balls. Do not scrub or use excessive force. Pat dry using gauze sponges, not tissue or cotton balls. Primary Dressing: Hydrofera Blue Ready Transfer Foam, 2.5x2.5 (in/in) 1 x Per Week/30 Days Discharge Instructions: Apply Hydrofera Blue Ready to wound bed as directed Secondary Dressing: ABD Pad 5x9 (in/in) 1 x Per Week/30 Days Discharge Instructions: Cover with ABD pad Compression Wrap: Profore Lite LF 3 Multilayer Compression Bandaging System 1 x Per Week/30 Days Discharge Instructions: Apply 3 multi-layer wrap as prescribed. WOUND #2: - Foot Wound Laterality: Right, Lateral, Distal Cleanser: Byram Ancillary Kit - 15 Day Supply (Generic) 1 x Per Week/30 Days Discharge Instructions: Use supplies as instructed; Kit contains: (15) Saline Bullets; (15) 3x3 Gauze; 15 pr Gloves Cleanser: Wound Cleanser 1 x Per Week/30 Days Discharge Instructions: Wash your hands with soap and water. Remove old dressing, discard into plastic bag and place into trash. Cleanse the wound with Wound Cleanser prior to applying a clean dressing using gauze sponges, not tissues or cotton balls. Do not scrub or use excessive force. Pat dry using gauze sponges, not tissue or cotton balls. Primary Dressing: Hydrofera Blue Ready Transfer Foam, 2.5x2.5 (in/in) (Generic) 1 x Per Week/30 Days Discharge Instructions: Apply Hydrofera Blue Ready  to wound bed as directed Secondary Dressing: ABD Pad 5x9 (in/in) 1 x Per Week/30 Days Discharge Instructions: Cover with ABD pad Compression Wrap: Profore Lite LF 3 Multilayer Compression Bandaging System 1 x Per Week/30 Days Discharge Instructions: Apply 3 multi-layer wrap as prescribed. 1. Would recommend that we go ahead and continue with wound  care measures as before and the patient is in agreement with that plan. This includes the use of the ABD pad over top of the [facility] which I think is doing an awesome job. 2. I am also can recommend that we continue with a 3 layer compression wrap which I think is doing excellent for him. We will see patient back for reevaluation in 1 week here in the clinic. If anything worsens or changes patient will contact our office for additional recommendations. Electronic Signature(s) Signed: 06/07/2021 11:15:15 AM By: Worthy Keeler PA-C Entered By: Worthy Keeler on 06/07/2021 11:15:14 [name] ([id number]) -------------------------------------------------------------------------------- SuperBill Details Patient Name: [name] Date of Service: 06/07/2021 Medical Record Number: [id number] Patient Account Number: [id number] Date of Birth/Sex: 16-Feb-1951 (70 y.o. 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) I10 Essential (primary) hypertension Z79.01 Long term (current) use of anticoagulants Z86.718 Personal history of other venous thrombosis and embolism Facility Procedures CPT4  Code: 21587276 Description: 18485 - DEB SUBQ TISSUE 20 SQ CM/< Modifier: Quantity: 1 CPT4 Code: Description: ICD-10 Diagnosis Description L97.512 Non-pressure chronic ulcer of other part of right foot with fat layer ex Modifier: posed Quantity: Physician Procedures CPT4 Code: 9276394 Description: 11042 - WC PHYS SUBQ TISS 20 SQ CM Modifier: Quantity: 1 CPT4 Code: Description: ICD-10 Diagnosis Description L97.512 Non-pressure chronic ulcer of other part of right foot with fat layer ex Modifier: posed Quantity: Electronic Signature(s) Signed: 06/07/2021 11:16:14 AM By: Worthy Keeler PA-C Entered By: Worthy Keeler on 06/07/2021 11:16:13

## 2021-06-07 NOTE — Progress Notes (Signed)
AMOL, DOMANSKI ([id number]) Visit Report for 06/07/2021 Arrival Information Details Patient Name: [name], [name] Date of Service: 06/07/2021 10:45 AM Medical Record Number: [id number] Patient Account Number: [id number] Date of Birth/Sex: Oct 23, 1950 (70 y.o. M) Treating RN: Dolan Amen Primary Care Kendale Rembold: Beverlyn Roux Other Clinician: Referring Deziah Renwick: Beverlyn Roux Treating Maclain Cohron/Extender: Skipper Cliche in Treatment: 5 Visit Information History Since Last Visit Pain Present Now: No Patient Arrived: Walker Arrival Time: 10:52 Accompanied By: self Transfer Assistance: None Patient Identification Verified: Yes Secondary Verification Process Completed: Yes Electronic Signature(s) Signed: 06/07/2021 4:44:56 PM By: Dolan Amen RN Entered By: Dolan Amen on 06/07/2021 10:52:32 [name] ([id number]) -------------------------------------------------------------------------------- Clinic Level of Care Assessment Details Patient Name: [name] Date of Service: 06/07/2021 10:45 AM Medical Record Number: [id number] Patient Account Number: [id number] Date of Birth/Sex: 1951-07-24 (70 y.o. M) Treating RN: Dolan Amen Primary Care Lalanya Rufener: Beverlyn Roux Other Clinician: Referring Juell Radney: Beverlyn Roux Treating Dolora Ridgely/Extender: Skipper Cliche in Treatment: 5 Clinic Level of Care Assessment Items TOOL 1 Quantity Score []  - Use when EandM and Procedure is performed on INITIAL visit 0 ASSESSMENTS - Nursing Assessment / Reassessment []  - General Physical Exam (combine w/ comprehensive assessment (listed just below) when performed on new 0 pt. evals) []  - 0 Comprehensive Assessment (HX, ROS, Risk Assessments, Wounds Hx, etc.) ASSESSMENTS - Wound and Skin Assessment / Reassessment []  - Dermatologic / Skin Assessment (not related to wound area) 0 ASSESSMENTS - Ostomy and/or Continence Assessment and Care []  - Incontinence Assessment and  Management 0 []  - 0 Ostomy Care Assessment and Management (repouching, etc.) PROCESS - Coordination of Care []  - Simple Patient / Family Education for ongoing care 0 []  - 0 Complex (extensive) Patient / Family Education for ongoing care []  - 0 Staff obtains Programmer, systems, Records, Test Results / Process Orders []  - 0 Staff telephones HHA, Nursing Homes / Clarify orders / etc []  - 0 Routine Transfer to another Facility (non-emergent condition) []  - 0 Routine Hospital Admission (non-emergent condition) []  - 0 New Admissions / Biomedical engineer / Ordering NPWT, Apligraf, etc. []  - 0 Emergency Hospital Admission (emergent condition) PROCESS - Special Needs []  - Pediatric / Minor Patient Management 0 []  - 0 Isolation Patient Management []  - 0 Hearing / Language / Visual special needs []  - 0 Assessment of Community assistance (transportation, D/C planning, etc.) []  - 0 Additional assistance / Altered mentation []  - 0 Support Surface(s) Assessment (bed, cushion, seat, etc.) INTERVENTIONS - Miscellaneous []  - External ear exam 0 []  - 0 Patient Transfer (multiple staff / Civil Service fast streamer / Similar devices) []  - 0 Simple Staple / Suture removal (25 or less) []  - 0 Complex Staple / Suture removal (26 or more) []  - 0 Hypo/Hyperglycemic Management (do not check if billed separately) []  - 0 Ankle / Brachial Index (ABI) - do not check if billed separately Has the patient been seen at the hospital within the last three years: Yes Total Score: 0 Level Of Care: ____ [name] ([id number]) Electronic Signature(s) Signed: 06/07/2021 4:44:56 PM By: Dolan Amen RN Entered By: Dolan Amen on 06/07/2021 11:15:01 [name] ([id number]) -------------------------------------------------------------------------------- Compression Therapy Details Patient Name: [name] Date of Service: 06/07/2021 10:45 AM Medical Record Number: [id number] Patient Account Number:  [id number] Date of Birth/Sex: 1951-06-02 (70 y.o. M) Treating RN: Dolan Amen Primary Care Bayley Hurn: Beverlyn Roux Other Clinician: Referring Hazely Sealey: Beverlyn Roux Treating Charene Mccallister/Extender: Skipper Cliche in Treatment: 5 Compression Therapy Performed for Wound Assessment: Wound #  1 Right,Proximal,Lateral Foot Performed By: Clinician Dolan Amen, RN Compression Type: Three Layer Post Procedure Diagnosis Same as Pre-procedure Electronic Signature(s) Signed: 06/07/2021 4:44:56 PM By: Dolan Amen RN Entered By: Dolan Amen on 06/07/2021 11:13:48 [name] ([id number]) -------------------------------------------------------------------------------- Compression Therapy Details Patient Name: [name] Date of Service: 06/07/2021 10:45 AM Medical Record Number: [id number] Patient Account Number: [id number] Date of Birth/Sex: [birth year] (70 y.o. M) Treating RN: Dolan Amen Primary Care Adryana Mogensen: Beverlyn Roux Other Clinician: Referring Cherrish Vitali: Beverlyn Roux Treating Dolton Shaker/Extender: Skipper Cliche in Treatment: 5 Compression Therapy Performed for Wound Assessment: Wound #2 Right,Distal,Lateral Foot Performed By: Cora Daniels, RN Compression Type: Three Layer Post Procedure Diagnosis Same as Pre-procedure Electronic Signature(s) Signed: 06/07/2021 4:44:56 PM By: Dolan Amen RN Entered By: Dolan Amen on 06/07/2021 11:13:48 [name] ([id number]) -------------------------------------------------------------------------------- Encounter Discharge Information Details Patient Name: [name] Date of Service: 06/07/2021 10:45 AM Medical Record Number: [id number] Patient Account Number: [id number] Date of Birth/Sex: 30-Jun-1951 (70 y.o. M) Treating RN: Dolan Amen Primary Care Janee Ureste: Beverlyn Roux Other Clinician: Referring Krisha Beegle: Beverlyn Roux Treating Alisha Burgo/Extender: Skipper Cliche in Treatment:  5 Encounter Discharge Information Items Post Procedure Vitals Discharge Condition: Stable Temperature (F): 98.1 Ambulatory Status: Walker Pulse (bpm): 83 Discharge Destination: Home Respiratory Rate (breaths/min): 18 Transportation: Private Auto Blood Pressure (mmHg): 118/67 Accompanied By: self Schedule Follow-up Appointment: Yes Clinical Summary of Care: Electronic Signature(s) Signed: 06/07/2021 4:44:56 PM By: Dolan Amen RN Entered By: Dolan Amen on 06/07/2021 11:30:44 [name] ([id number]) -------------------------------------------------------------------------------- Lower Extremity Assessment Details Patient Name: [name] Date of Service: 06/07/2021 10:45 AM Medical Record Number: [id number] Patient Account Number: [id number] Date of Birth/Sex: [birth year] (70 y.o. M) Treating RN: Dolan Amen Primary Care Saylee Sherrill: Beverlyn Roux Other Clinician: Referring Emmalise Huard: Beverlyn Roux Treating Velia Pamer/Extender: Skipper Cliche in Treatment: 5 Edema Assessment Assessed: [Left: No] [Right: Yes] Edema: [Left: N] [Right: o] Calf Left: Right: Point of Measurement: 31 cm From Medial Instep 41 cm Ankle Left: Right: Point of Measurement: 10 cm From Medial Instep 26 cm Vascular Assessment Pulses: Dorsalis Pedis Palpable: [Right:Yes] Electronic Signature(s) Signed: 06/07/2021 4:44:56 PM By: Dolan Amen RN Entered By: Dolan Amen on 06/07/2021 11:08:03 [name] ([id number]) -------------------------------------------------------------------------------- Multi Wound Chart Details Patient Name: [name] Date of Service: 06/07/2021 10:45 AM Medical Record Number: [id number] Patient Account Number: [id number] Date of Birth/Sex: [birth year] (70 y.o. M) Treating RN: Dolan Amen Primary Care Elizeo Rodriques: Beverlyn Roux Other Clinician: Referring Kojo Liby: Beverlyn Roux Treating Helmer Dull/Extender: Skipper Cliche in Treatment:  5 Vital Signs Height(in): 59 Pulse(bpm): 57 Weight(lbs): 255 Blood Pressure(mmHg): 118/67 Body Mass Index(BMI): 36 Temperature(F): 98.1 Respiratory Rate(breaths/min): 18 Photos: [N/A:N/A] Wound Location: Right, Proximal, Lateral Foot Right, Distal, Lateral Foot N/A Wounding Event: Gradually Appeared Gradually Appeared N/A Primary Etiology: Diabetic Wound/Ulcer of the Lower Diabetic Wound/Ulcer of the Lower N/A Extremity Extremity Comorbid History: Cataracts, Lymphedema, Deep Vein Cataracts, Lymphedema, Deep Vein N/A Thrombosis, Hypertension, Type II Thrombosis, Hypertension, Type II Diabetes, Osteoarthritis, Diabetes, Osteoarthritis, Osteomyelitis Osteomyelitis Date Acquired: 05/03/2020 05/03/2020 N/A Weeks of Treatment: 5 5 N/A Wound Status: Open Open N/A Measurements L x W x D (cm) 1.4x2x0.2 0.7x0.9x0.1 N/A Area (cm) : 2.199 0.495 N/A Volume (cm) : 0.44 0.049 N/A % Reduction in Area: 26.30% 79.80% N/A % Reduction in Volume: 75.40% 90.00% N/A Classification: Grade 2 Grade 2 N/A Exudate Amount: Medium Medium N/A Exudate Type: Serosanguineous Serosanguineous N/A Exudate Color: red, brown red, brown N/A Granulation Amount: Large (67-100%) Large (67-100%) N/A Granulation Quality: Red,  Pink Red N/A Necrotic Amount: Small (1-33%) Small (1-33%) N/A Exposed Structures: Fat Layer (Subcutaneous Tissue): Fat Layer (Subcutaneous Tissue): N/A Yes Yes Fascia: No Fascia: No Tendon: No Tendon: No Muscle: No Muscle: No Joint: No Joint: No Bone: No Bone: No Epithelialization: Small (1-33%) Medium (34-66%) N/A Treatment Notes Electronic Signature(s) Signed: 06/07/2021 4:44:56 PM By: Dolan Amen RN Entered By: Dolan Amen on 06/07/2021 11:11:41 [name] ([id number]) -------------------------------------------------------------------------------- [place] Details Patient Name: [name] Date of Service: 06/07/2021 10:45 AM Medical  Record Number: [id number] Patient Account Number: [id number] Date of Birth/Sex: 15-Nov-1950 (70 y.o. M) Treating RN: Dolan Amen Primary Care Shayli Altemose: Beverlyn Roux Other Clinician: Referring Denika Krone: Beverlyn Roux Treating Abbygayle Helfand/Extender: Skipper Cliche in Treatment: 5 Active Inactive Abuse / Safety / Falls / Self Care Management Nursing Diagnoses: Potential for falls Potential for injury related to falls Goals: Patient will not experience any injury related to falls Date Initiated: 05/03/2021 Target Resolution Date: 06/02/2021 Goal Status: Active Patient will remain injury free related to falls Date Initiated: 05/03/2021 Target Resolution Date: 06/02/2021 Goal Status: Active Patient/caregiver will verbalize understanding of skin care regimen Date Initiated: 05/03/2021 Target Resolution Date: 05/03/2021 Goal Status: Active Interventions: Call light and/or bell within patient's reach Podiatry chair, stretcher in low position and side rails up as needed Assess Activities of Daily Living upon admission and as needed Assess fall risk on admission and as needed Assess: immobility, friction, shearing, incontinence upon admission and as needed Assess impairment of mobility on admission and as needed per policy Notes: Necrotic Tissue Nursing Diagnoses: Impaired tissue integrity related to necrotic/devitalized tissue Knowledge deficit related to management of necrotic/devitalized tissue Goals: Necrotic/devitalized tissue will be minimized in the wound bed Date Initiated: 05/03/2021 Target Resolution Date: 05/03/2021 Goal Status: Active Patient/caregiver will verbalize understanding of reason and process for debridement of necrotic tissue Date Initiated: 05/03/2021 Target Resolution Date: 05/03/2021 Goal Status: Active Interventions: Assess patient pain level pre-, during and post procedure and prior to discharge Provide education on necrotic tissue and debridement process Treatment  Activities: Apply topical anesthetic as ordered : 05/03/2021 Biologic debridement : 05/03/2021 Enzymatic debridement : 05/03/2021 Excisional debridement : 05/03/2021 Notes: CORBIN, FALCK ([id number]) Wound/Skin Impairment Nursing Diagnoses: Impaired tissue integrity Goals: Patient/caregiver will verbalize understanding of skin care regimen Date Initiated: 05/03/2021 Target Resolution Date: 05/03/2021 Goal Status: Active Ulcer/skin breakdown will have a volume reduction of 30% by week 4 Date Initiated: 05/03/2021 Target Resolution Date: 06/02/2021 Goal Status: Active Ulcer/skin breakdown will have a volume reduction of 50% by week 8 Date Initiated: 05/03/2021 Target Resolution Date: 07/03/2021 Goal Status: Active Ulcer/skin breakdown will have a volume reduction of 80% by week 12 Date Initiated: 05/03/2021 Target Resolution Date: 08/02/2021 Goal Status: Active Ulcer/skin breakdown will heal within 14 weeks Date Initiated: 05/03/2021 Target Resolution Date: 09/02/2021 Goal Status: Active Interventions: Assess patient/caregiver ability to obtain necessary supplies Assess patient/caregiver ability to perform ulcer/skin care regimen upon admission and as needed Assess ulceration(s) every visit Provide education on ulcer and skin care Treatment Activities: Referred to DME Mahaila Tischer for dressing supplies : 05/03/2021 Skin care regimen initiated : 05/03/2021 Notes: Electronic Signature(s) Signed: 06/07/2021 4:44:56 PM By: Dolan Amen RN Entered By: Dolan Amen on 06/07/2021 11:11:33 [name] ([id number]) -------------------------------------------------------------------------------- Pain Assessment Details Patient Name: [name] Date of Service: 06/07/2021 10:45 AM Medical Record Number: [id number] Patient Account Number: [id number] Date of Birth/Sex: 10-11-1950 (70 y.o. M) Treating RN: Dolan Amen Primary Care Joaquina Nissen: Beverlyn Roux Other Clinician: Referring Emelly Wurtz:  Beverlyn Roux Treating Sundus Pete/Extender: Jeri Cos Weeks in Treatment: 5 Active Problems Location of Pain Severity and Description of Pain Patient Has Paino No Site Locations Pain Management and Medication Current Pain Management: Electronic Signature(s) Signed: 06/07/2021 4:44:56 PM By: Dolan Amen RN Entered By: Dolan Amen on 06/07/2021 10:54:08 [name] ([id number]) -------------------------------------------------------------------------------- Patient/Caregiver Education Details Patient Name: [name] Date of Service: 06/07/2021 10:45 AM Medical Record Number: [id number] Patient Account Number: [id number] Date of Birth/Gender: 08-16-1951 (70 y.o. M) Treating RN: Dolan Amen Primary Care Physician: Beverlyn Roux Other Clinician: Referring Physician: Beverlyn Roux Treating Physician/Extender: Skipper Cliche in Treatment: 5 Education Assessment Education Provided To: Patient Education Topics Provided Wound Debridement: Methods: Explain/Verbal Responses: State content correctly Wound/Skin Impairment: Methods: Explain/Verbal Responses: State content correctly Electronic Signature(s) Signed: 06/07/2021 4:44:56 PM By: Dolan Amen RN Entered By: Dolan Amen on 06/07/2021 11:15:25 [name] ([id number]) -------------------------------------------------------------------------------- Wound Assessment Details Patient Name: [name] Date of Service: 06/07/2021 10:45 AM Medical Record Number: [id number] Patient Account Number: [id number] Date of Birth/Sex: 12-19-1950 (70 y.o. M) Treating RN: Dolan Amen Primary Care Issak Goley: Beverlyn Roux Other Clinician: Referring Issac Moure: Beverlyn Roux Treating Decarlo Rivet/Extender: Skipper Cliche in Treatment: 5 Wound Status Wound Number: 1 Primary Diabetic Wound/Ulcer of the Lower Extremity Etiology: Wound Location: Right, Proximal, Lateral Foot Wound Open Wounding Event:  Gradually Appeared Status: Date Acquired: 05/03/2020 Comorbid Cataracts, Lymphedema, Deep Vein Thrombosis, Weeks Of Treatment: 5 History: Hypertension, Type II Diabetes, Osteoarthritis, Clustered Wound: No Osteomyelitis Photos Wound Measurements Length: (cm) 1.4 Width: (cm) 2 Depth: (cm) 0.2 Area: (cm) 2.199 Volume: (cm) 0.44 % Reduction in Area: 26.3% % Reduction in Volume: 75.4% Epithelialization: Small (1-33%) Tunneling: No Undermining: No Wound Description Classification: Grade 2 Exudate Amount: Medium Exudate Type: Serosanguineous Exudate Color: red, brown Foul Odor After Cleansing: No Slough/Fibrino Yes Wound Bed Granulation Amount: Large (67-100%) Exposed Structure Granulation Quality: Red, Pink Fascia Exposed: No Necrotic Amount: Small (1-33%) Fat Layer (Subcutaneous Tissue) Exposed: Yes Necrotic Quality: Adherent Slough Tendon Exposed: No Muscle Exposed: No Joint Exposed: No Bone Exposed: No Treatment Notes Wound #1 (Foot) Wound Laterality: Right, Lateral, Proximal Cleanser Wound Cleanser Discharge Instruction: Wash your hands with soap and water. Remove old dressing, discard into plastic bag and place into trash. Cleanse the wound with Wound Cleanser prior to applying a clean dressing using gauze sponges, not tissues or cotton balls. Do not scrub or use excessive force. Pat dry using gauze sponges, not tissue or cotton balls. ASANI, MCBURNEY ([id number]) Peri-Wound Care Topical Primary Dressing Hydrofera Blue Ready Transfer Foam, 2.5x2.5 (in/in) Discharge Instruction: Apply Hydrofera Blue Ready to wound bed as directed Secondary Dressing ABD Pad 5x9 (in/in) Discharge Instruction: Cover with ABD pad Secured With Compression Wrap Profore Lite LF 3 Multilayer Compression [place] Discharge Instruction: Apply 3 multi-layer wrap as prescribed. Compression Stockings Add-Ons Electronic Signature(s) Signed: 06/07/2021 4:44:56 PM By: Dolan Amen RN Entered By: Dolan Amen on 06/07/2021 11:03:30 [name] ([id number]) -------------------------------------------------------------------------------- Wound Assessment Details Patient Name: [name] Date of Service: 06/07/2021 10:45 AM Medical Record Number: [id number] Patient Account Number: [id number] Date of Birth/Sex: [birth year] (69 y.o. M) Treating RN: Dolan Amen Primary Care Diem Dicocco: Beverlyn Roux Other Clinician: Referring Graham Hyun: Beverlyn Roux Treating Imraan Wendell/Extender: Skipper Cliche in Treatment: 5 Wound Status Wound Number: 2 Primary Diabetic Wound/Ulcer of the Lower Extremity Etiology: Wound Location: Right, Distal, Lateral Foot Wound Open Wounding Event: Gradually Appeared Status: Date Acquired: 05/03/2020 Comorbid Cataracts, Lymphedema, Deep Vein Thrombosis, Weeks Of Treatment: 5  History: Hypertension, Type II Diabetes, Osteoarthritis, Clustered Wound: No Osteomyelitis Photos Wound Measurements Length: (cm) 0.7 Width: (cm) 0.9 Depth: (cm) 0.1 Area: (cm) 0.495 Volume: (cm) 0.049 % Reduction in Area: 79.8% % Reduction in Volume: 90% Epithelialization: Medium (34-66%) Tunneling: No Undermining: No Wound Description Classification: Grade 2 Exudate Amount: Medium Exudate Type: Serosanguineous Exudate Color: red, brown Foul Odor After Cleansing: No Slough/Fibrino Yes Wound Bed Granulation Amount: Large (67-100%) Exposed Structure Granulation Quality: Red Fascia Exposed: No Necrotic Amount: Small (1-33%) Fat Layer (Subcutaneous Tissue) Exposed: Yes Necrotic Quality: Adherent Slough Tendon Exposed: No Muscle Exposed: No Joint Exposed: No Bone Exposed: No Treatment Notes Wound #2 (Foot) Wound Laterality: Right, Lateral, Distal Cleanser [place] Ancillary Kit - 15 Day Supply Discharge Instruction: Use supplies as instructed; Kit contains: (15) Saline Bullets; (15) 3x3 Gauze; 15 pr Gloves Wound Cleanser JAGJIT, RINER ([id number]) Discharge Instruction: Wash your hands with soap and water. Remove old dressing, discard into plastic bag and place into trash. Cleanse the wound with Wound Cleanser prior to applying a clean dressing using gauze sponges, not tissues or cotton balls. Do not scrub or use excessive force. Pat dry using gauze sponges, not tissue or cotton balls. Peri-Wound Care Topical Primary Dressing Hydrofera Blue Ready Transfer Foam, 2.5x2.5 (in/in) Discharge Instruction: Apply Hydrofera Blue Ready to wound bed as directed Secondary Dressing ABD Pad 5x9 (in/in) Discharge Instruction: Cover with ABD pad Secured With Compression Wrap Profore Lite LF 3 Multilayer Compression [place] Discharge Instruction: Apply 3 multi-layer wrap as prescribed. Compression Stockings Add-Ons Electronic Signature(s) Signed: 06/07/2021 4:44:56 PM By: Dolan Amen RN Entered By: Dolan Amen on 06/07/2021 11:03:58 [name] ([id number]) -------------------------------------------------------------------------------- Vitals Details Patient Name: [name] Date of Service: 06/07/2021 10:45 AM Medical Record Number: [id number] Patient Account Number: [id number] Date of Birth/Sex: [birth year] (70 y.o. M) Treating RN: Dolan Amen Primary Care Jakhai Fant: Beverlyn Roux Other Clinician: Referring Espiridion Supinski: Beverlyn Roux Treating Uziel Covault/Extender: Skipper Cliche in Treatment: 5 Vital Signs Time Taken: 10:52 Temperature (F): 98.1 Height (in): 71 Pulse (bpm): 83 Weight (lbs): 255 Respiratory Rate (breaths/min): 18 Body Mass Index (BMI): 35.6 Blood Pressure (mmHg): 118/67 Reference Range: 80 - 120 mg / dl Electronic Signature(s) Signed: 06/07/2021 4:44:56 PM By: Dolan Amen RN Entered By: Dolan Amen on 06/07/2021 10:53:56

## 2021-06-14 ENCOUNTER — Other Ambulatory Visit: Payer: Self-pay

## 2021-06-14 DIAGNOSIS — E11621 Type 2 diabetes mellitus with foot ulcer: Secondary | ICD-10-CM | POA: Diagnosis not present

## 2021-06-14 NOTE — Progress Notes (Signed)
RUDIE, SERMONS ([id number]) Visit Report for 06/14/2021 Arrival Information Details Patient Name: [name], [name] Date of Service: 06/14/2021 11:30 AM Medical Record Number: [id number] Patient Account Number: [id number] Date of Birth/Sex: 17-Jun-1951 (70 y.o. M) Treating RN: Dolan Amen Primary Care Irish Piech: Beverlyn Roux Other Clinician: Referring Marvel Sapp: Beverlyn Roux Treating Tavon Magnussen/Extender: Skipper Cliche in Treatment: 6 Visit Information History Since Last Visit Pain Present Now: No Patient Arrived: Walker Arrival Time: 11:45 Accompanied By: self Transfer Assistance: None Patient Identification Verified: Yes Secondary Verification Process Completed: Yes Electronic Signature(s) Signed: 06/14/2021 4:04:33 PM By: Dolan Amen RN Entered By: Dolan Amen on 06/14/2021 12:05:55 [name] ([id number]) -------------------------------------------------------------------------------- Clinic Level of Care Assessment Details Patient Name: [name] Date of Service: 06/14/2021 11:30 AM Medical Record Number: [id number] Patient Account Number: [id number] Date of Birth/Sex: [birth year] (71 y.o. M) Treating RN: Dolan Amen Primary Care Niveah Boerner: Beverlyn Roux Other Clinician: Referring Jaymin Waln: Beverlyn Roux Treating Athalee Esterline/Extender: Skipper Cliche in Treatment: 6 Clinic Level of Care Assessment Items TOOL 1 Quantity Score []  - Use when EandM and Procedure is performed on INITIAL visit 0 ASSESSMENTS - Nursing Assessment / Reassessment []  - General Physical Exam (combine w/ comprehensive assessment (listed just below) when performed on new 0 pt. evals) []  - 0 Comprehensive Assessment (HX, ROS, Risk Assessments, Wounds Hx, etc.) ASSESSMENTS - Wound and Skin Assessment / Reassessment []  - Dermatologic / Skin Assessment (not related to wound area) 0 ASSESSMENTS - Ostomy and/or Continence Assessment and Care []  - Incontinence Assessment and  Management 0 []  - 0 Ostomy Care Assessment and Management (repouching, etc.) PROCESS - Coordination of Care []  - Simple Patient / Family Education for ongoing care 0 []  - 0 Complex (extensive) Patient / Family Education for ongoing care []  - 0 Staff obtains Programmer, systems, Records, Test Results / Process Orders []  - 0 Staff telephones HHA, Nursing Homes / Clarify orders / etc []  - 0 Routine Transfer to another Facility (non-emergent condition) []  - 0 Routine Hospital Admission (non-emergent condition) []  - 0 New Admissions / Biomedical engineer / Ordering NPWT, Apligraf, etc. []  - 0 Emergency Hospital Admission (emergent condition) PROCESS - Special Needs []  - Pediatric / Minor Patient Management 0 []  - 0 Isolation Patient Management []  - 0 Hearing / Language / Visual special needs []  - 0 Assessment of Community assistance (transportation, D/C planning, etc.) []  - 0 Additional assistance / Altered mentation []  - 0 Support Surface(s) Assessment (bed, cushion, seat, etc.) INTERVENTIONS - Miscellaneous []  - External ear exam 0 []  - 0 Patient Transfer (multiple staff / Civil Service fast streamer / Similar devices) []  - 0 Simple Staple / Suture removal (25 or less) []  - 0 Complex Staple / Suture removal (26 or more) []  - 0 Hypo/Hyperglycemic Management (do not check if billed separately) []  - 0 Ankle / Brachial Index (ABI) - do not check if billed separately Has the patient been seen at the hospital within the last three years: Yes Total Score: 0 Level Of Care: ____ [name] ([id number]) Electronic Signature(s) Signed: 06/14/2021 4:04:33 PM By: Dolan Amen RN Entered By: Dolan Amen on 06/14/2021 12:08:05 [name] ([id number]) -------------------------------------------------------------------------------- Compression Therapy Details Patient Name: [name] Date of Service: 06/14/2021 11:30 AM Medical Record Number: [id number] Patient Account  Number: [id number] Date of Birth/Sex: October 13, 1950 (70 y.o. M) Treating RN: Dolan Amen Primary Care Dewayne Severe: Beverlyn Roux Other Clinician: Referring Ky Moskowitz: Beverlyn Roux Treating James Senn/Extender: Skipper Cliche in Treatment: 6 Compression Therapy Performed for Wound Assessment: Wound #  1 Right,Proximal,Lateral Foot Performed By: Clinician Dolan Amen, RN Compression Type: Three Layer Electronic Signature(s) Signed: 06/14/2021 4:04:33 PM By: Dolan Amen RN Entered By: Dolan Amen on 06/14/2021 12:06:31 [name] ([id number]) -------------------------------------------------------------------------------- Compression Therapy Details Patient Name: [name] Date of Service: 06/14/2021 11:30 AM Medical Record Number: [id number] Patient Account Number: [id number] Date of Birth/Sex: 04/03/1951 (70 y.o. M) Treating RN: Dolan Amen Primary Care Sharina Petre: Beverlyn Roux Other Clinician: Referring Isaish Alemu: Beverlyn Roux Treating Aadan Chenier/Extender: Skipper Cliche in Treatment: 6 Compression Therapy Performed for Wound Assessment: Wound #2 Right,Distal,Lateral Foot Performed By: Cora Daniels, RN Compression Type: Three Layer Electronic Signature(s) Signed: 06/14/2021 4:04:33 PM By: Dolan Amen RN Entered By: Dolan Amen on 06/14/2021 12:06:31 [name] ([id number]) -------------------------------------------------------------------------------- Encounter Discharge Information Details Patient Name: [name], [name] Date of Service: 06/14/2021 11:30 AM Medical Record Number: [id number] Patient Account Number: [id number] Date of Birth/Sex: [birth year] (70 y.o. M) Treating RN: Dolan Amen Primary Care Deedee Lybarger: Beverlyn Roux Other Clinician: Referring Shamell Hittle: Beverlyn Roux Treating Braylynn Ghan/Extender: Skipper Cliche in Treatment: 6 Encounter Discharge Information Items Discharge Condition: Stable Ambulatory Status:  Walker Discharge Destination: Home Transportation: Private Auto Accompanied By: self Schedule Follow-up Appointment: Yes Clinical Summary of Care: Electronic Signature(s) Signed: 06/14/2021 4:04:33 PM By: Dolan Amen RN Entered By: Dolan Amen on 06/14/2021 12:07:58 [name] ([id number]) -------------------------------------------------------------------------------- Wound Assessment Details Patient Name: [name] Date of Service: 06/14/2021 11:30 AM Medical Record Number: [id number] Patient Account Number: [id number] Date of Birth/Sex: July 07, 1951 (70 y.o. M) Treating RN: Dolan Amen Primary Care Denajah Farias: Beverlyn Roux Other Clinician: Referring Haneefah Venturini: Beverlyn Roux Treating Peder Allums/Extender: Skipper Cliche in Treatment: 6 Wound Status Wound Number: 1 Primary Etiology: Diabetic Wound/Ulcer of the Lower Extremity Wound Location: Right, Proximal, Lateral Foot Wound Status: Open Wounding Event: Gradually Appeared Date Acquired: 05/03/2020 Weeks Of Treatment: 6 Clustered Wound: No Wound Measurements Length: (cm) 1.4 Width: (cm) 2 Depth: (cm) 0.2 Area: (cm) 2.199 Volume: (cm) 0.44 % Reduction in Area: 26.3% % Reduction in Volume: 75.4% Wound Description Classification: Grade 2 Exudate Amount: Medium Exudate Type: Serosanguineous Exudate Color: red, brown Treatment Notes Wound #1 (Foot) Wound Laterality: Right, Lateral, Proximal Cleanser Wound Cleanser Discharge Instruction: Wash your hands with soap and water. Remove old dressing, discard into plastic bag and place into trash. Cleanse the wound with Wound Cleanser prior to applying a clean dressing using gauze sponges, not tissues or cotton balls. Do not scrub or use excessive force. Pat dry using gauze sponges, not tissue or cotton balls. Peri-Wound Care Topical Primary Dressing Hydrofera Blue Ready Transfer Foam, 2.5x2.5 (in/in) Discharge Instruction: Apply Hydrofera Blue Ready to  wound bed as directed Secondary Dressing ABD Pad 5x9 (in/in) Discharge Instruction: Cover with ABD pad Secured With Compression Wrap Profore Lite LF 3 Multilayer Compression [place] Discharge Instruction: Apply 3 multi-layer wrap as prescribed. Compression Stockings Add-Ons Electronic Signature(s) Signed: 06/14/2021 4:04:33 PM By: Dolan Amen RN Entered By: Dolan Amen on 06/14/2021 12:06:13 [name] ([id number], [name] ([id number]) -------------------------------------------------------------------------------- Wound Assessment Details Patient Name: [name], [name] Date of Service: 06/14/2021 11:30 AM Medical Record Number: [id number] Patient Account Number: [id number] Date of Birth/Sex: 31-Dec-1950 (70 y.o. M) Treating RN: Dolan Amen Primary Care Irl Bodie: Beverlyn Roux Other Clinician: Referring Toniann Dickerson: Beverlyn Roux Treating Rayetta Veith/Extender: Skipper Cliche in Treatment: 6 Wound Status Wound Number: 2 Primary Etiology: Diabetic Wound/Ulcer of the Lower Extremity Wound Location: Right, Distal, Lateral Foot Wound Status: Open Wounding Event: Gradually Appeared Date Acquired: 05/03/2020 Weeks Of  Treatment: 6 Clustered Wound: No Wound Measurements Length: (cm) 0.7 Width: (cm) 0.9 Depth: (cm) 0.1 Area: (cm) 0.495 Volume: (cm) 0.049 % Reduction in Area: 79.8% % Reduction in Volume: 90% Wound Description Classification: Grade 2 Exudate Amount: Medium Exudate Type: Serosanguineous Exudate Color: red, brown Treatment Notes Wound #2 (Foot) Wound Laterality: Right, Lateral, Distal Cleanser [place] Ancillary Kit - 15 Day Supply Discharge Instruction: Use supplies as instructed; Kit contains: (15) Saline Bullets; (15) 3x3 Gauze; 15 pr Gloves Wound Cleanser Discharge Instruction: Wash your hands with soap and water. Remove old dressing, discard into plastic bag and place into trash. Cleanse the wound with Wound Cleanser prior  to applying a clean dressing using gauze sponges, not tissues or cotton balls. Do not scrub or use excessive force. Pat dry using gauze sponges, not tissue or cotton balls. Peri-Wound Care Topical Primary Dressing Hydrofera Blue Ready Transfer Foam, 2.5x2.5 (in/in) Discharge Instruction: Apply Hydrofera Blue Ready to wound bed as directed Secondary Dressing ABD Pad 5x9 (in/in) Discharge Instruction: Cover with ABD pad Secured With Compression Wrap Profore Lite LF 3 Multilayer Compression [place] Discharge Instruction: Apply 3 multi-layer wrap as prescribed. Compression Stockings Add-Ons Electronic Signature(s) [name], [name] ([id number]) Signed: 06/14/2021 4:04:33 PM By: Dolan Amen RN Entered By: Dolan Amen on 06/14/2021 12:06:13

## 2021-06-21 ENCOUNTER — Other Ambulatory Visit: Payer: Self-pay

## 2021-06-21 ENCOUNTER — Encounter: Payer: Medicare HMO | Admitting: Physician Assistant

## 2021-06-21 DIAGNOSIS — E11621 Type 2 diabetes mellitus with foot ulcer: Secondary | ICD-10-CM | POA: Diagnosis not present

## 2021-06-21 NOTE — Progress Notes (Addendum)
KRAMER, HANRAHAN ([id number]) Visit Report for 06/21/2021 Chief Complaint Document Details Patient Name: [name], [name]. Date of Service: 06/21/2021 2:00 PM Medical Record Number: [id number] Patient Account Number: [id number] Date of Birth/Sex: [birth year] (70 y.o. M) Treating RN: Rogers Blocker Primary Care Provider: Beverely Low Other Clinician: Referring Provider: Beverely Low Treating Provider/Extender: Rowan Blase in Treatment: 7 Information Obtained from: Patient Chief Complaint Bilateral foot ulcers Electronic Signature(s) Signed: 06/21/2021 2:40:24 PM By: Lenda Kelp PA-C Entered By: Lenda Kelp on 06/21/2021 14:40:24 [name] ([id number]) -------------------------------------------------------------------------------- HPI Details Patient Name: [name] Date of Service: 06/21/2021 2:00 PM Medical Record Number: [id number] Patient Account Number: [id number] Date of Birth/Sex: [birth year] (70 y.o. 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 Has been seen by [facility]iatry [facility] for a number of years it sounds like at least 4 years. With that being said they have tried a split thickness skin graft along with multiple other topical remedies over the time they have been seeing him over the past several years. Unfortunately they are not able to get this closed I did refer him to [place] for further evaluation and treatment. The patient tells me has been quite sometime since he  remembers having an x-ray and again we could not find anything recent when looking through his chart. Fortunately there does not appear to be any signs of active infection systemically which is great news. No fevers, chills, nausea, vomiting, or diarrhea. The patient does have diabetes with his most recent hemoglobin A1c being November 14, 2020 at 9.1. He also does have evidence of lymphedema based on what I am seeing today. 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 05/10/2021 upon evaluation today patient appears to be doing well with regard to his wounds in general. I feel like he is making progress. Fortunately the compression wrap did well for him and overall I am extremely happy with where we stand. No fevers, chills, nausea, vomiting, or diarrhea. 05/17/2021 upon evaluation today patient appears to be doing better in general regard to his wounds. Overall I feel like he is actually signs of good improvement which is great news and I think that we are headed in the right direction. There does not appear to be any signs of active infection at this time. No fevers, chills, nausea, vomiting, or diarrhea. 05/24/2021 upon evaluation today patient appears to be doing well with regard to his wounds. He is actually showing signs of improvement this is great news. Its mainly the right foot proximal wound that is going to require debridement today and again I do need to try to clear away some of the lip where he had some epiboly I then work on this week by week but if we get that cleared away I think this will heal much more effectively and quickly. 05/31/2021 upon evaluation today patient appears to be doing well at this point in regard to  his wounds. Both are showing signs of improvement and very pleased in that regard. Fortunately there does not appear to  be any evidence of active infection systemically which is great news. No fevers, chills, nausea, vomiting, or diarrhea. 06/07/2021 upon evaluation today patient appears to be making excellent progress in regard to his foot ulcers. Both are showing signs of excellent improvement. Fortunately there does not appear to be any evidence of active infection which is great news. No fevers, chills, nausea, vomiting, or diarrhea. 06/21/2021 upon evaluation today patient appears to be doing well with regard to his wound. In fact he is making excellent progress and I am extremely pleased with where things stand today. There does not appear to be any signs of active infection at this time. Electronic Signature(s) Signed: 06/21/2021 3:00:26 PM By: Lenda Kelp PA-C Entered By: Lenda Kelp on 06/21/2021 15:00:26 [name] ([id number]) -------------------------------------------------------------------------------- Physical Exam Details Patient Name: [name] Date of Service: 06/21/2021 2:00 PM Medical Record Number: [id number] Patient Account Number: [id number] Date of Birth/Sex: 04-26-1951 (70 y.o. M) Treating RN: Rogers Blocker Primary Care Provider: Beverely Low Other Clinician: Referring Provider: Beverely Low Treating Provider/Extender: Rowan Blase in Treatment: 7 Constitutional Well-nourished and well-hydrated in no acute distress. Respiratory normal breathing without difficulty. Psychiatric this patient is able to make decisions and demonstrates good insight into disease process. Alert and Oriented x 3. pleasant and cooperative. Notes Patient's wounds are showing signs of significant improvement the distal wound appears to be healed the proximal wound is significantly smaller than not healed I think it is very close to resolution. Electronic Signature(s) Signed: 06/21/2021 3:00:47 PM By: Lenda Kelp PA-C Entered By: Lenda Kelp on 06/21/2021  15:00:47 [name] ([id number]) -------------------------------------------------------------------------------- Physician Orders Details Patient Name: [name] Date of Service: 06/21/2021 2:00 PM Medical Record Number: [id number] Patient Account Number: [id number] Date of Birth/Sex: Dec 04, 1950 (70 y.o. 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 o Nurse Visit as needed Bathing/ Shower/ Hygiene o May shower with wound dressing protected with water repellent cover or cast protector. o No tub bath. Edema Control - Lymphedema / Segmental Compressive Device / Other Right Lower Extremity o Optional: One layer of unna paste to top of compression wrap (to act as an anchor). o Elevate legs to the level of the heart and pump ankles as often as possible o Elevate leg(s) parallel to the floor when sitting. o Compression Pump: Use compression pump on left lower extremity for 60 minutes, twice daily. o Compression Pump: Use compression pump on right lower extremity for 60 minutes, twice daily. Wound Treatment Wound #1 - Foot Wound Laterality: Right, Lateral, Proximal Cleanser: Wound Cleanser 1 x Per Week/30 Days Discharge Instructions: Wash your hands with soap and water. Remove old  dressing, discard into plastic bag and place into trash. Cleanse the wound with Wound Cleanser prior to applying a clean dressing using  gauze sponges, not tissues or cotton balls. Do not scrub or use excessive force. Pat dry using gauze sponges, not tissue or cotton balls. Primary Dressing: Hydrofera Blue Ready Transfer Foam, 2.5x2.5 (in/in) 1 x Per Week/30 Days Discharge Instructions: Apply Hydrofera Blue Ready to wound bed as directed Secondary Dressing: ABD Pad 5x9 (in/in) 1 x Per Week/30 Days Discharge Instructions: Cover with ABD pad Compression Wrap: Profore Lite LF 3 Multilayer Compression Bandaging System 1 x Per Week/30 Days Discharge Instructions: Apply 3 multi-layer wrap as prescribed. Electronic Signature(s) Signed: 06/21/2021 4:28:58 PM By: Lenda Kelp PA-C Signed: 06/21/2021 4:52:43 PM By: Rogers Blocker RN Entered By: Rogers Blocker on 06/21/2021 14:47:02 [name] ([id number]) -------------------------------------------------------------------------------- Problem List Details Patient Name: GOKUL, WAYBRIGHT Date of Service: 06/21/2021 2:00 PM Medical Record Number: [id number] Patient Account Number: [id number] Date of Birth/Sex: 1951-01-14 (70 y.o. M) Treating RN: Rogers Blocker Primary Care Provider: Beverely Low Other Clinician: Referring Provider: Beverely Low Treating Provider/Extender: Rowan Blase in Treatment: 7 Active Problems ICD-10 Encounter Code Description Active Date MDM Diagnosis E11.621 Type 2 diabetes mellitus with foot ulcer 05/03/2021 No Yes L97.512 Non-pressure chronic ulcer of other part of right foot with fat layer 05/03/2021 No Yes exposed L97.522 Non-pressure chronic ulcer of other part of left foot with fat layer 05/03/2021 No Yes exposed Z89.421 Acquired absence of other right toe(s) 05/03/2021 No Yes I10 Essential (primary) hypertension 05/03/2021 No Yes Z79.01 Long term (current) use of anticoagulants 05/03/2021 No Yes Z86.718  Personal history of other venous thrombosis and embolism 05/03/2021 No Yes Inactive Problems Resolved Problems Electronic Signature(s) Signed: 06/21/2021 2:40:16 PM By: Lenda Kelp PA-C Entered By: Lenda Kelp on 06/21/2021 14:40:16 [name] ([id number]) -------------------------------------------------------------------------------- Progress Note Details Patient Name: [name] Date of Service: 06/21/2021 2:00 PM Medical Record Number: [id number] Patient Account Number: [id number] Date of Birth/Sex: [birth year] (70 y.o. 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 Has been seen by [facility]ry [facility] for a number of years it sounds like at least 4 years. With that being said they have tried a split thickness skin graft along with multiple other topical remedies over the time they have been seeing him over the past several years. Unfortunately they are not able to get this closed I did refer him to [place] for further evaluation and treatment. The patient tells me has been quite sometime since he remembers having an x-ray and again we could not find anything recent when looking through his chart. Fortunately there does not appear to be any signs of active infection systemically which is great news. No fevers, chills, nausea, vomiting, or diarrhea. The patient does have diabetes with his most recent hemoglobin A1c being November 14, 2020 at 9.1. He also does have evidence of lymphedema based on what I am seeing today. 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 05/10/2021  upon evaluation today patient appears to be doing well with regard to his wounds in general. I feel like he is making progress. Fortunately the compression wrap did well for him and overall I am extremely happy with where we stand. No fevers, chills, nausea, vomiting, or diarrhea. 05/17/2021 upon evaluation today patient appears to be doing better in general regard to his wounds. Overall I feel like he is actually signs of good improvement which is great news and I think that we are headed in the right direction. There does not appear to be any signs of active infection at this time. No fevers, chills, nausea, vomiting, or diarrhea. 05/24/2021 upon evaluation today patient appears to be doing well with regard to his wounds. He is actually showing signs of improvement this is great news. Its mainly the right foot proximal wound that is going to require debridement today and again I do need to try to clear away some of the lip where he had some epiboly I then work on this week by week but if we get that cleared away I think this will heal much more effectively and quickly. 05/31/2021 upon evaluation today patient appears to be doing well at this point in regard to his wounds. Both are showing signs of improvement and very pleased in that regard. Fortunately there does not appear to be any evidence of active infection systemically which is great news. No fevers, chills, nausea, vomiting, or diarrhea. 06/07/2021 upon evaluation today patient appears to be making excellent progress in regard to his foot ulcers. Both are showing signs of excellent improvement. Fortunately there does not appear to be any evidence of  active infection which is great news. No fevers, chills, nausea, vomiting, or diarrhea. 06/21/2021 upon evaluation today patient appears to be doing well with regard to his wound. In fact he is making excellent progress and I am extremely pleased with where things stand today. There does not appear to be any signs of active infection at this time. Objective Constitutional Well-nourished and well-hydrated in no acute distress. Vitals Time Taken: 2:14 PM, Height: 71 in, Weight: 255 lbs, BMI: 35.6, Temperature: 97.9 F, Pulse: 81 bpm, Respiratory Rate: 18 breaths/min, Blood Pressure: 122/73 mmHg. Respiratory normal breathing without difficulty. Psychiatric this patient is able to make decisions and demonstrates good insight into disease process. Alert and Oriented x 3. pleasant and cooperative. ANTWANN, PREZIOSI ([id number]) General Notes: Patient's wounds are showing signs of significant improvement the distal wound appears to be healed the proximal wound is significantly smaller than not healed I think it is very close to resolution. Integumentary (Hair, Skin) Wound #1 status is Open. Original cause of wound was Gradually Appeared. The date acquired was: 05/03/2020. The wound has been in treatment 7 weeks. The wound is located on the Right,Proximal,Lateral Foot. The wound measures 1.3cm length x 0.7cm width x 0.1cm depth; 0.715cm^2 area and 0.071cm^3 volume. There is Fat Layer (Subcutaneous Tissue) exposed. There is no tunneling or undermining noted. There is a medium amount of serosanguineous drainage noted. There is large (67-100%) red granulation within the wound bed. There is a small (1-33%) amount of necrotic tissue within the wound bed. Wound #2 status is Healed - Epithelialized. Original cause of wound was Gradually Appeared. The date acquired was: 05/03/2020. The wound has been in treatment 7 weeks. The wound is located on the Right,Distal,Lateral Foot. The wound measures 0cm length x 0cm  width x 0cm depth; 0cm^2 area and 0cm^3 volume. There is no tunneling or  undermining noted. There is a none present amount of drainage noted. There is no granulation within the wound bed. There is no necrotic tissue within the wound bed. 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 There was a Three Layer Compression Therapy Procedure by Rogers Blocker, RN. Post procedure Diagnosis Wound #1: Same as Pre-Procedure Plan Follow-up Appointments: Return Appointment in 1 week. Nurse Visit as needed Bathing/ Shower/ Hygiene: May shower with wound dressing protected with water repellent cover or cast protector. No tub bath. Edema Control - Lymphedema / Segmental Compressive Device / Other: Optional: One layer of unna paste to top of compression wrap (to act as an anchor). Elevate legs to the level of the heart and pump ankles as often as possible Elevate leg(s) parallel to the floor when sitting. Compression Pump: Use compression pump on left lower extremity for 60 minutes, twice daily. Compression Pump: Use compression pump on right lower extremity for 60 minutes, twice daily. WOUND #1: - Foot Wound Laterality: Right, Lateral, Proximal Cleanser: Wound Cleanser 1 x Per Week/30 Days Discharge Instructions: Wash your hands with soap and water. Remove old dressing, discard into plastic bag and place into trash. Cleanse the wound with Wound Cleanser prior to applying a clean dressing using gauze  sponges, not tissues or cotton balls. Do not scrub or use excessive force. Pat dry using gauze sponges, not tissue or cotton balls. Primary Dressing: Hydrofera Blue Ready Transfer Foam, 2.5x2.5 (in/in) 1 x Per Week/30 Days Discharge Instructions: Apply Hydrofera Blue Ready to wound bed as directed Secondary Dressing: ABD Pad 5x9 (in/in) 1 x Per Week/30 Days Discharge Instructions: Cover with ABD pad Compression Wrap: Profore Lite LF 3 Multilayer Compression Bandaging System 1 x Per Week/30 Days Discharge Instructions: Apply 3 multi-layer wrap as prescribed. YARETH, MACDONNELL ([id number]) 1. Would recommend that we going continue with the [facility] I think this is doing a great job and the patient is in agreement with that plan. 2. I am also can recommend that we continue with the compression wrap I think this is doing awesome for him and again we have been utilizing currently a 3 layer compression wrap. We will see patient back for reevaluation in 1 week here in the clinic. If anything worsens or changes patient will contact our office for additional recommendations. Electronic Signature(s) Signed: 06/21/2021 3:01:02 PM By: Lenda Kelp PA-C Entered By: Lenda Kelp on 06/21/2021 15:01:01 [name] ([id number]) -------------------------------------------------------------------------------- SuperBill Details Patient Name: [name] Date of Service: 06/21/2021 Medical Record Number: [id number] Patient Account Number: [id number] Date of Birth/Sex: 06/24/1951 (70 y.o. M) Treating RN: Rogers Blocker Primary Care Provider: Beverely Low Other Clinician: Referring Provider: Beverely Low Treating Provider/Extender: Rowan Blase in Treatment: 7 Diagnosis Coding ICD-10 Codes Code Description E11.621 Type 2 diabetes mellitus with foot ulcer L97.512 Non-pressure chronic ulcer of other part of right foot with fat layer exposed L97.522 Non-pressure chronic  ulcer of other part of left foot with fat layer exposed Z89.421 Acquired absence of other right toe(s) I10 Essential (primary) hypertension Z79.01 Long term (  current) use of anticoagulants Z86.718 Personal history of other venous thrombosis and embolism Facility Procedures CPT4 Code: 16109604 Description: (Facility Use Only) [phone] - APPLY MULTLAY COMPRS LWR RT LEG Modifier: Quantity: 1 Physician Procedures CPT4 Code: 9147829 Description: 99213 - WC PHYS LEVEL 3 - EST PT Modifier: Quantity: 1 CPT4 Code: Description: ICD-10 Diagnosis Description E11.621 Type 2 diabetes mellitus with foot ulcer L97.512 Non-pressure chronic ulcer of other part of right foot with fat layer e L97.522 Non-pressure chronic ulcer of other part of left foot with fat layer ex  Z89.421 Acquired absence of other right toe(s) Modifier: xposed posed Quantity: Electronic Signature(s) Signed: 06/21/2021 3:01:29 PM By: Lenda Kelp PA-C Entered By: Lenda Kelp on 06/21/2021 15:01:28

## 2021-06-21 NOTE — Progress Notes (Signed)
LEVII, HAIRFIELD ([id number]) Visit Report for 06/21/2021 Arrival Information Details Patient Name: [name], [name] Date of Service: 06/21/2021 2:00 PM Medical Record Number: [id number] Patient Account Number: [id number] Date of Birth/Sex: 10/13/1950 (70 y.o. M) Treating RN: Rogers Blocker Primary Care Zaray Gatchel: Beverely Low Other Clinician: Referring Jaysun Wessels: Beverely Low Treating Wende Longstreth/Extender: Rowan Blase in Treatment: 7 Visit Information History Since Last Visit Pain Present Now: No Patient Arrived: Walker Arrival Time: 14:14 Accompanied By: self Transfer Assistance: None Patient Identification Verified: Yes Secondary Verification Process Completed: Yes Electronic Signature(s) Signed: 06/21/2021 4:52:43 PM By: Rogers Blocker RN Entered By: Rogers Blocker on 06/21/2021 14:14:29 [name] ([id number]) -------------------------------------------------------------------------------- Clinic Level of Care Assessment Details Patient Name: [name] Date of Service: 06/21/2021 2:00 PM Medical Record Number: [id number] Patient Account Number: [id number] Date of Birth/Sex: Jan 17, 1951 (70 y.o. M) Treating RN: Rogers Blocker Primary Care Lamiracle Chaidez: Beverely Low Other Clinician: Referring Drea Jurewicz: Beverely Low Treating Alysen Smylie/Extender: Rowan Blase in Treatment: 7 Clinic Level of Care Assessment Items TOOL 1 Quantity Score []  - Use when EandM and Procedure is performed on INITIAL visit 0 ASSESSMENTS - Nursing Assessment / Reassessment []  - General Physical Exam (combine w/ comprehensive assessment (listed just below) when performed on new 0 pt. evals) []  - 0 Comprehensive Assessment (HX, ROS, Risk Assessments, Wounds Hx, etc.) ASSESSMENTS - Wound and Skin Assessment / Reassessment []  - Dermatologic / Skin Assessment (not related to wound area) 0 ASSESSMENTS - Ostomy and/or Continence Assessment and Care []  - Incontinence Assessment and  Management 0 []  - 0 Ostomy Care Assessment and Management (repouching, etc.) PROCESS - Coordination of Care []  - Simple Patient / Family Education for ongoing care 0 []  - 0 Complex (extensive) Patient / Family Education for ongoing care []  - 0 Staff obtains , Records, Test Results / Process Orders []  - 0 Staff telephones HHA, Nursing Homes / Clarify orders / etc []  - 0 Routine Transfer to another Facility (non-emergent condition) []  - 0 Routine Hospital Admission (non-emergent condition) []  - 0 New Admissions / / Ordering NPWT, Apligraf, etc. []  - 0 Emergency Hospital Admission (emergent condition) PROCESS - Special Needs []  - Pediatric / Minor Patient Management 0 []  - 0 Isolation Patient Management []  - 0 Hearing / Language / Visual special needs []  - 0 Assessment of Community assistance (transportation, D/C planning, etc.) []  - 0 Additional assistance / Altered mentation []  - 0 Support Surface(s) Assessment (bed, cushion, seat, etc.) INTERVENTIONS - Miscellaneous []  - External ear exam 0 []  - 0 Patient Transfer (multiple staff / / Similar devices) []  - 0 Simple Staple / Suture removal (25 or less) []  - 0 Complex Staple / Suture removal (26 or more) []  - 0 Hypo/Hyperglycemic Management (do not check if billed separately) []  - 0 Ankle / Brachial Index (ABI) - do not check if billed separately Has the patient been seen at the hospital within the last three years: Yes Total Score: 0 Level Of Care: ____ ( ) Electronic Signature(s) Signed: 06/21/2021 4:52:43 PM By: RN Entered By: on 06/21/2021 14:47:11 Chiropractor ( ) -------------------------------------------------------------------------------- Compression Therapy Details Patient Name: Date of Service: 06/21/2021 2:00 PM Medical Record Number: Patient Account  Number: Manufacturing engineer Date of Birth/Sex: Jan 05, 1951 (70 y.o. M) Treating RN: Primary Care Kodie Kishi: Other Clinician: Referring Nahjae Hoeg: Treating Pamula Luther/Extender: in Treatment: 7 Compression Therapy Performed for Wound Assessment: Wound #  1 Right,Proximal,Lateral Foot Performed By: Clinician Rogers Blocker, RN Compression Type: Three Layer Post Procedure Diagnosis Same as Pre-procedure Electronic Signature(s) Signed: 06/21/2021 4:52:43 PM By: Rogers Blocker RN Entered By: Rogers Blocker on 06/21/2021 14:46:32 [name] ([id number]) -------------------------------------------------------------------------------- Encounter Discharge Information Details Patient Name: [name] Date of Service: 06/21/2021 2:00 PM Medical Record Number: [id number] Patient Account Number: [id number] Date of Birth/Sex: Apr 18, 1951 (70 y.o. M) Treating RN: Rogers Blocker Primary Care Joseeduardo Brix: Beverely Low Other Clinician: Referring Brooklen Runquist: Beverely Low Treating Shemuel Harkleroad/Extender: Rowan Blase in Treatment: 7 Encounter Discharge Information Items Discharge Condition: Stable Ambulatory Status: Walker Discharge Destination: Home Transportation: Private Auto Accompanied By: self Schedule Follow-up Appointment: Yes Clinical Summary of Care: Electronic Signature(s) Signed: 06/21/2021 4:48:29 PM By: Rogers Blocker RN Entered By: Rogers Blocker on 06/21/2021 16:48:29 [name] ([id number]) -------------------------------------------------------------------------------- Lower Extremity Assessment Details Patient Name: [name] Date of Service: 06/21/2021 2:00 PM Medical Record Number: [id number] Patient Account Number: [id number] Date of Birth/Sex: 17-May-1951 (70 y.o. M) Treating RN: Rogers Blocker Primary Care Berthel Bagnall: Beverely Low Other Clinician: Referring Taje Littler: Beverely Low Treating  Jalayiah Bibian/Extender: Rowan Blase in Treatment: 7 Edema Assessment Assessed: [Left: No] [Right: Yes] Edema: [Left: Ye] [Right: s] Calf Left: Right: Point of Measurement: 31 cm From Medial Instep 40.5 cm Ankle Left: Right: Point of Measurement: 10 cm From Medial Instep 25 cm Vascular Assessment Pulses: Dorsalis Pedis Palpable: [Right:Yes] Electronic Signature(s) Signed: 06/21/2021 4:52:43 PM By: Rogers Blocker RN Entered By: Rogers Blocker on 06/21/2021 14:30:50 [name] ([id number]) -------------------------------------------------------------------------------- Multi Wound Chart Details Patient Name: [name] Date of Service: 06/21/2021 2:00 PM Medical Record Number: [id number] Patient Account Number: [id number] Date of Birth/Sex: 1951-02-21 (70 y.o. M) Treating RN: Rogers Blocker Primary Care Niley Helbig: Beverely Low Other Clinician: Referring Kenetra Hildenbrand: Beverely Low Treating Marikay Roads/Extender: Rowan Blase in Treatment: 7 Vital Signs Height(in): 71 Pulse(bpm): 81 Weight(lbs): 255 Blood Pressure(mmHg): 122/73 Body Mass Index(BMI): 36 Temperature(F): 97.9 Respiratory Rate(breaths/min): 18 Photos: [N/A:N/A] Wound Location: Right, Proximal, Lateral Foot Right, Distal, Lateral Foot N/A Wounding Event: Gradually Appeared Gradually Appeared N/A Primary Etiology: Diabetic Wound/Ulcer of the Lower Diabetic Wound/Ulcer of the Lower N/A Extremity Extremity Comorbid History: Cataracts, Lymphedema, Deep Vein Cataracts, Lymphedema, Deep Vein N/A Thrombosis, Hypertension, Type II Thrombosis, Hypertension, Type II Diabetes, Osteoarthritis, Diabetes, Osteoarthritis, Osteomyelitis Osteomyelitis Date Acquired: 05/03/2020 05/03/2020 N/A Weeks of Treatment: 7 7 N/A Wound Status: Open Healed - Epithelialized N/A Measurements L x W x D (cm) 1.3x0.7x0.1 0x0x0 N/A Area (cm) : 0.715 0 N/A Volume (cm) : 0.071 0 N/A % Reduction in Area: 76.00% 100.00% N/A %  Reduction in Volume: 96.00% 100.00% N/A Classification: Grade 2 Grade 2 N/A Exudate Amount: Medium None Present N/A Exudate Type: Serosanguineous N/A N/A Exudate Color: red, brown N/A N/A Granulation Amount: Large (67-100%) None Present (0%) N/A Granulation Quality: Red N/A N/A Necrotic Amount: Small (1-33%) None Present (0%) N/A Exposed Structures: Fat Layer (Subcutaneous Tissue): Fascia: No N/A Yes Fat Layer (Subcutaneous Tissue): Fascia: No No Tendon: No Tendon: No Muscle: No Muscle: No Joint: No Joint: No Bone: No Bone: No Epithelialization: Large (67-100%) Large (67-100%) N/A Treatment Notes Electronic Signature(s) Signed: 06/21/2021 4:52:43 PM By: Rogers Blocker RN Entered By: Rogers Blocker on 06/21/2021 14:46:09 [name] ([id number]) -------------------------------------------------------------------------------- Multi-Disciplinary Care Plan Details Patient Name: [name] Date of Service: 06/21/2021 2:00 PM Medical Record Number: [id number] Patient Account Number: [id number] Date of Birth/Sex: 14-Feb-1951 (70 y.o. M) Treating RN: Rogers Blocker Primary Care Oswin Griffith: Beverely Low Other Clinician:  Referring Evoleht Hovatter: Beverely Low Treating Esteven Overfelt/Extender: Rowan Blase in Treatment: 7 Active Inactive Abuse / Safety / Falls / Self Care Management Nursing Diagnoses: Potential for falls Potential for injury related to falls Goals: Patient will not experience any injury related to falls Date Initiated: 05/03/2021 Target Resolution Date: 06/02/2021 Goal Status: Active Patient will remain injury free related to falls Date Initiated: 05/03/2021 Target Resolution Date: 06/02/2021 Goal Status: Active Patient/caregiver will verbalize understanding of skin care regimen Date Initiated: 05/03/2021 Target Resolution Date: 05/03/2021 Goal Status: Active Interventions: Call light and/or bell within patient's reach Podiatry chair, stretcher in low  position and side rails up as needed Assess Activities of Daily Living upon admission and as needed Assess fall risk on admission and as needed Assess: immobility, friction, shearing, incontinence upon admission and as needed Assess impairment of mobility on admission and as needed per policy Notes: Necrotic Tissue Nursing Diagnoses: Impaired tissue integrity related to necrotic/devitalized tissue Knowledge deficit related to management of necrotic/devitalized tissue Goals: Necrotic/devitalized tissue will be minimized in the wound bed Date Initiated: 05/03/2021 Target Resolution Date: 05/03/2021 Goal Status: Active Patient/caregiver will verbalize understanding of reason and process for debridement of necrotic tissue Date Initiated: 05/03/2021 Target Resolution Date: 05/03/2021 Goal Status: Active Interventions: Assess patient pain level pre-, during and post procedure and prior to discharge Provide education on necrotic tissue and debridement process Treatment Activities: Apply topical anesthetic as ordered : 05/03/2021 Biologic debridement : 05/03/2021 Enzymatic debridement : 05/03/2021 Excisional debridement : 05/03/2021 Notes: [name], [name] ([id number]) Wound/Skin Impairment Nursing Diagnoses: Impaired tissue integrity Goals: Patient/caregiver will verbalize understanding of skin care regimen Date Initiated: 05/03/2021 Target Resolution Date: 05/03/2021 Goal Status: Active Ulcer/skin breakdown will have a volume reduction of 30% by week 4 Date Initiated: 05/03/2021 Target Resolution Date: 06/02/2021 Goal Status: Active Ulcer/skin breakdown will have a volume reduction of 50% by week 8 Date Initiated: 05/03/2021 Target Resolution Date: 07/03/2021 Goal Status: Active Ulcer/skin breakdown will have a volume reduction of 80% by week 12 Date Initiated: 05/03/2021 Target Resolution Date: 08/02/2021 Goal Status: Active Ulcer/skin breakdown will heal within 14 weeks Date Initiated:  05/03/2021 Target Resolution Date: 09/02/2021 Goal Status: Active Interventions: Assess patient/caregiver ability to obtain necessary supplies Assess patient/caregiver ability to perform ulcer/skin care regimen upon admission and as needed Assess ulceration(s) every visit Provide education on ulcer and skin care Treatment Activities: Referred to DME Lula Kolton for dressing supplies : 05/03/2021 Skin care regimen initiated : 05/03/2021 Notes: Electronic Signature(s) Signed: 06/21/2021 4:52:43 PM By: Rogers Blocker RN Entered By: Rogers Blocker on 06/21/2021 14:46:01 [name] ([id number]) -------------------------------------------------------------------------------- Pain Assessment Details Patient Name: [name] Date of Service: 06/21/2021 2:00 PM Medical Record Number: [id number] Patient Account Number: [id number] Date of Birth/Sex: [birth year] (70 y.o. M) Treating RN: Rogers Blocker Primary Care Yitzel Shasteen: Beverely Low Other Clinician: Referring Izik Bingman: Beverely Low Treating Zaelyn Noack/Extender: Rowan Blase in Treatment: 7 Active Problems Location of Pain Severity and Description of Pain Patient Has Paino No Site Locations Pain Management and Medication Current Pain Management: Electronic Signature(s) Signed: 06/21/2021 4:52:43 PM By: Rogers Blocker RN Entered By: Rogers Blocker on 06/21/2021 14:15:40 [name] ([id number]) -------------------------------------------------------------------------------- Patient/Caregiver Education Details Patient Name: [name] Date of Service: 06/21/2021 2:00 PM Medical Record Number: [id number] Patient Account Number: [id number] Date of Birth/Gender: 03/31/1951 (70 y.o. M) Treating RN: Rogers Blocker Primary Care Physician: Beverely Low Other Clinician: Referring Physician: Beverely Low Treating Physician/Extender: Rowan Blase in Treatment: 7 Education Assessment Education Provided  To: Patient Education  Topics Provided Wound/Skin Impairment: Methods: Explain/Verbal Responses: State content correctly Electronic Signature(s) Signed: 06/21/2021 4:52:43 PM By: Rogers Blocker RN Entered By: Rogers Blocker on 06/21/2021 15:00:50 [name] ([id number]) -------------------------------------------------------------------------------- Wound Assessment Details Patient Name: [name] Date of Service: 06/21/2021 2:00 PM Medical Record Number: [id number] Patient Account Number: [id number] Date of Birth/Sex: March 03, 1951 (70 y.o. M) Treating RN: Rogers Blocker Primary Care Stanislawa Gaffin: Beverely Low Other Clinician: Referring Chyrel Taha: Beverely Low Treating Dayvian Blixt/Extender: Rowan Blase in Treatment: 7 Wound Status Wound Number: 1 Primary Diabetic Wound/Ulcer of the Lower Extremity Etiology: Wound Location: Right, Proximal, Lateral Foot Wound Open Wounding Event: Gradually Appeared Status: Date Acquired: 05/03/2020 Comorbid Cataracts, Lymphedema, Deep Vein Thrombosis, Weeks Of Treatment: 7 History: Hypertension, Type II Diabetes, Osteoarthritis, Clustered Wound: No Osteomyelitis Photos Wound Measurements Length: (cm) 1.3 Width: (cm) 0.7 Depth: (cm) 0.1 Area: (cm) 0.715 Volume: (cm) 0.071 % Reduction in Area: 76% % Reduction in Volume: 96% Epithelialization: Large (67-100%) Tunneling: No Undermining: No Wound Description Classification: Grade 2 Exudate Amount: Medium Exudate Type: Serosanguineous Exudate Color: red, brown Foul Odor After Cleansing: No Slough/Fibrino No Wound Bed Granulation Amount: Large (67-100%) Exposed Structure Granulation Quality: Red Fascia Exposed: No Necrotic Amount: Small (1-33%) Fat Layer (Subcutaneous Tissue) Exposed: Yes Tendon Exposed: No Muscle Exposed: No Joint Exposed: No Bone Exposed: No Treatment Notes Wound #1 (Foot) Wound Laterality: Right, Lateral, Proximal Cleanser Wound  Cleanser Discharge Instruction: Wash your hands with soap and water. Remove old dressing, discard into plastic bag and place into trash. Cleanse the wound with Wound Cleanser prior to applying a clean dressing using gauze sponges, not tissues or cotton balls. Do not scrub or use excessive force. Pat dry using gauze sponges, not tissue or cotton balls. [name], [name] ([id number]) Peri-Wound Care Topical Primary Dressing Hydrofera Blue Ready Transfer Foam, 2.5x2.5 (in/in) Discharge Instruction: Apply Hydrofera Blue Ready to wound bed as directed Secondary Dressing ABD Pad 5x9 (in/in) Discharge Instruction: Cover with ABD pad Secured With Compression Wrap Profore Lite LF 3 Multilayer Compression Bandaging System Discharge Instruction: Apply 3 multi-layer wrap as prescribed. Compression Stockings Add-Ons Electronic Signature(s) Signed: 06/21/2021 4:52:43 PM By: Rogers Blocker RN Entered By: Rogers Blocker on 06/21/2021 14:45:52 [name] ([id number]) -------------------------------------------------------------------------------- Wound Assessment Details Patient Name: [name] Date of Service: 06/21/2021 2:00 PM Medical Record Number: [id number] Patient Account Number: [id number] Date of Birth/Sex: 1950/12/25 (70 y.o. M) Treating RN: Rogers Blocker Primary Care Tyquisha Sharps: Beverely Low Other Clinician: Referring Donesha Wallander: Beverely Low Treating Amyre Segundo/Extender: Rowan Blase in Treatment: 7 Wound Status Wound Number: 2 Primary Diabetic Wound/Ulcer of the Lower Extremity Etiology: Wound Location: Right, Distal, Lateral Foot Wound Healed - Epithelialized Wounding Event: Gradually Appeared Status: Date Acquired: 05/03/2020 Comorbid Cataracts, Lymphedema, Deep Vein Thrombosis, Weeks Of Treatment: 7 History: Hypertension, Type II Diabetes, Osteoarthritis, Clustered Wound: No Osteomyelitis Photos Wound Measurements Length: (cm) Width: (cm) Depth:  (cm) Area: (cm) Volume: (cm) 0 % Reduction in Area: 100% 0 % Reduction in Volume: 100% 0 Epithelialization: Large (67-100%) 0 Tunneling: No 0 Undermining: No Wound Description Classification: Grade 2 Exudate Amount: None Present Foul Odor After Cleansing: No Slough/Fibrino No Wound Bed Granulation Amount: None Present (0%) Exposed Structure Necrotic Amount: None Present (0%) Fascia Exposed: No Fat Layer (Subcutaneous Tissue) Exposed: No Tendon Exposed: No Muscle Exposed: No Joint Exposed: No Bone Exposed: No Electronic Signature(s) Signed: 06/21/2021 4:52:43 PM By: Rogers Blocker RN Entered By: Rogers Blocker on 06/21/2021 14:44:59 [name] ([id number]) -------------------------------------------------------------------------------- Vitals Details Patient Name: [name]  Date of Service: 06/21/2021 2:00 PM Medical Record Number: [id number] Patient Account Number: [id number] Date of Birth/Sex: 06/24/1951 (70 y.o. M) Treating RN: Rogers Blocker Primary Care Cortana Vanderford: Beverely Low Other Clinician: Referring Kennetha Pearman: Beverely Low Treating Catherine Oak/Extender: Rowan Blase in Treatment: 7 Vital Signs Time Taken: 14:14 Temperature (F): 97.9 Height (in): 71 Pulse (bpm): 81 Weight (lbs): 255 Respiratory Rate (breaths/min): 18 Body Mass Index (BMI): 35.6 Blood Pressure (mmHg): 122/73 Reference Range: 80 - 120 mg / dl Electronic Signature(s) Signed: 06/21/2021 4:52:43 PM By: Rogers Blocker RN Entered By: Rogers Blocker on 06/21/2021 14:15:25

## 2021-06-28 ENCOUNTER — Encounter: Payer: Medicare HMO | Admitting: Physician Assistant

## 2021-06-28 ENCOUNTER — Other Ambulatory Visit: Payer: Self-pay

## 2021-06-28 DIAGNOSIS — E11621 Type 2 diabetes mellitus with foot ulcer: Secondary | ICD-10-CM | POA: Diagnosis not present

## 2021-06-28 NOTE — Progress Notes (Addendum)
VALENTINO, SAAVEDRA ([id number]) Visit Report for 06/28/2021 Arrival Information Details Patient Name: [name], [name] Date of Service: 06/28/2021 2:00 PM Medical Record Number: [id number] Patient Account Number: [id number] Date of Birth/Sex: [birth year] (70 y.o. M) Treating RN: Huel Coventry Primary Care Aviela Blundell: Beverely Low Other Clinician: Referring Ephram Kornegay: Beverely Low Treating Antwoine Zorn/Extender: Rowan Blase in Treatment: 8 Visit Information History Since Last Visit Added or deleted any medications: No Patient Arrived: Walker Has Dressing in Place as Prescribed: Yes Arrival Time: 14:33 Has Compression in Place as Prescribed: Yes Accompanied By: self Pain Present Now: No Transfer Assistance: None Patient Identification Verified: Yes Secondary Verification Process Completed: Yes Patient Has Alerts: Yes Patient Alerts: Patient on Blood Thinner Type II Diabetic 81mg  aspirin Electronic Signature(s) Signed: 07/01/2021 3:17:27 PM By: 07/03/2021, BSN, RN, CWS, Kim RN, BSN Entered By: Elliot Gurney, BSN, RN, CWS, Kim on 06/28/2021 14:34:47 06/30/2021 ([name]) -------------------------------------------------------------------------------- Encounter Discharge Information Details Patient Name: [id number] Date of Service: 06/28/2021 2:00 PM Medical Record Number: 06/30/2021 Patient Account Number: [id number] Date of Birth/Sex: [birth year] (70 y.o. M) Treating RN: (75 Primary Care Tondalaya Perren: Huel Coventry Other Clinician: Referring Chue Berkovich: Beverely Low Treating Amerigo Mcglory/Extender: Beverely Low in Treatment: 8 Encounter Discharge Information Items Post Procedure Vitals Discharge Condition: Stable Unable to obtain vitals Reason: limited time Ambulatory Status: Walker Discharge Destination: Home Transportation: Private Auto Schedule Follow-up Appointment: Yes Clinical Summary of Care: Electronic Signature(s) Signed: 07/01/2021 3:17:27 PM By: 07/03/2021, BSN,  RN, CWS, Kim RN, BSN Entered By: Elliot Gurney, BSN, RN, CWS, Kim on 06/28/2021 15:10:10 06/30/2021 ([name]) -------------------------------------------------------------------------------- Lower Extremity Assessment Details Patient Name: [name], [name] Date of Service: 06/28/2021 2:00 PM Medical Record Number: 06/30/2021 Patient Account Number: [id number] Date of Birth/Sex: Sep 14, 1950 (70 y.o. M) Treating RN: (75 Primary Care Shaneta Cervenka: Huel Coventry Other Clinician: Referring Coline Calkin: Beverely Low Treating Elliannah Wayment/Extender: Beverely Low in Treatment: 8 Edema Assessment Assessed: [Left: No] [Right: No] [Left: Edema] [Right: :] Calf Left: Right: Point of Measurement: 31 cm From Medial Instep 42 cm Ankle Left: Right: Point of Measurement: 10 cm From Medial Instep 27.5 cm Vascular Assessment Pulses: Dorsalis Pedis Palpable: [Right:Yes] Electronic Signature(s) Signed: 07/01/2021 3:17:27 PM By: 07/03/2021, BSN, RN, CWS, Kim RN, BSN Entered By: Elliot Gurney, BSN, RN, CWS, Kim on 06/28/2021 14:45:31 06/30/2021 ([name]) -------------------------------------------------------------------------------- Multi Wound Chart Details Patient Name: [id number] Date of Service: 06/28/2021 2:00 PM Medical Record Number: 06/30/2021 Patient Account Number: [id number] Date of Birth/Sex: [birth year] (70 y.o. M) Treating RN: (75 Primary Care Adren Dollins: Huel Coventry Other Clinician: Referring Jade Burright: Beverely Low Treating Zilphia Kozinski/Extender: Beverely Low in Treatment: 8 Vital Signs Height(in): 71 Pulse(bpm): 90 Weight(lbs): 255 Blood Pressure(mmHg): 137/71 Body Mass Index(BMI): 36 Temperature(F): 98.7 Respiratory Rate(breaths/min): 18 Photos: [N/A:N/A] Wound Location: Right, Proximal, Lateral Foot N/A N/A Wounding Event: Gradually Appeared N/A N/A Primary Etiology: Diabetic Wound/Ulcer of the Lower N/A N/A Extremity Comorbid History: Cataracts,  Lymphedema, Deep Vein N/A N/A Thrombosis, Hypertension, Type II Diabetes, Osteoarthritis, Osteomyelitis Date Acquired: 05/03/2020 N/A N/A Weeks of Treatment: 8 N/A N/A Wound Status: Open N/A N/A Measurements L x W x D (cm) 1.3x1x0.2 N/A N/A Area (cm) : 1.021 N/A N/A Volume (cm) : 0.204 N/A N/A % Reduction in Area: 65.80% N/A N/A % Reduction in Volume: 88.60% N/A N/A Classification: Grade 2 N/A N/A Exudate Amount: Medium N/A N/A Exudate Type: Serosanguineous N/A N/A Exudate Color: red, brown N/A N/A Granulation Amount: Medium (34-66%) N/A N/A Granulation Quality: Red N/A N/A Necrotic Amount: Medium (  34-66%) N/A N/A Exposed Structures: Fat Layer (Subcutaneous Tissue): N/A N/A Yes Fascia: No Tendon: No Muscle: No Joint: No Bone: No Epithelialization: None N/A N/A Treatment Notes Electronic Signature(s) Signed: 07/01/2021 3:17:27 PM By: Elliot Gurney, BSN, RN, CWS, Kim RN, BSN Entered By: Elliot Gurney, BSN, RN, CWS, Kim on 06/28/2021 14:45:48 [name] ([id number]) -------------------------------------------------------------------------------- Multi-Disciplinary Care Plan Details Patient Name: [name], [name]. Date of Service: 06/28/2021 2:00 PM Medical Record Number: [id number] Patient Account Number: [id number] Date of Birth/Sex: February 21, 1951 (70 y.o. M) Treating RN: Huel Coventry Primary Care Henretter Piekarski: Beverely Low Other Clinician: Referring Jeanetta Alonzo: Beverely Low Treating Shadasia Oldfield/Extender: Rowan Blase in Treatment: 8 Active Inactive Abuse / Safety / Falls / Self Care Management Nursing Diagnoses: Potential for falls Potential for injury related to falls Goals: Patient will not experience any injury related to falls Date Initiated: 05/03/2021 Target Resolution Date: 06/02/2021 Goal Status: Active Patient will remain injury free related to falls Date Initiated: 05/03/2021 Target Resolution Date: 06/02/2021 Goal Status: Active Patient/caregiver will verbalize  understanding of skin care regimen Date Initiated: 05/03/2021 Target Resolution Date: 05/03/2021 Goal Status: Active Interventions: Call light and/or bell within patient's reach Podiatry chair, stretcher in low position and side rails up as needed Assess Activities of Daily Living upon admission and as needed Assess fall risk on admission and as needed Assess: immobility, friction, shearing, incontinence upon admission and as needed Assess impairment of mobility on admission and as needed per policy Notes: Necrotic Tissue Nursing Diagnoses: Impaired tissue integrity related to necrotic/devitalized tissue Knowledge deficit related to management of necrotic/devitalized tissue Goals: Necrotic/devitalized tissue will be minimized in the wound bed Date Initiated: 05/03/2021 Target Resolution Date: 05/03/2021 Goal Status: Active Patient/caregiver will verbalize understanding of reason and process for debridement of necrotic tissue Date Initiated: 05/03/2021 Target Resolution Date: 05/03/2021 Goal Status: Active Interventions: Assess patient pain level pre-, during and post procedure and prior to discharge Provide education on necrotic tissue and debridement process Treatment Activities: Apply topical anesthetic as ordered : 05/03/2021 Biologic debridement : 05/03/2021 Enzymatic debridement : 05/03/2021 Excisional debridement : 05/03/2021 Notes: [name], [name] ([id number]) Wound/Skin Impairment Nursing Diagnoses: Impaired tissue integrity Goals: Patient/caregiver will verbalize understanding of skin care regimen Date Initiated: 05/03/2021 Target Resolution Date: 05/03/2021 Goal Status: Active Ulcer/skin breakdown will have a volume reduction of 30% by week 4 Date Initiated: 05/03/2021 Target Resolution Date: 06/02/2021 Goal Status: Active Ulcer/skin breakdown will have a volume reduction of 50% by week 8 Date Initiated: 05/03/2021 Target Resolution Date: 07/03/2021 Goal Status: Active Ulcer/skin  breakdown will have a volume reduction of 80% by week 12 Date Initiated: 05/03/2021 Target Resolution Date: 08/02/2021 Goal Status: Active Ulcer/skin breakdown will heal within 14 weeks Date Initiated: 05/03/2021 Target Resolution Date: 09/02/2021 Goal Status: Active Interventions: Assess patient/caregiver ability to obtain necessary supplies Assess patient/caregiver ability to perform ulcer/skin care regimen upon admission and as needed Assess ulceration(s) every visit Provide education on ulcer and skin care Treatment Activities: Referred to DME Basma Buchner for dressing supplies : 05/03/2021 Skin care regimen initiated : 05/03/2021 Notes: Electronic Signature(s) Signed: 07/01/2021 3:17:27 PM By: Elliot Gurney, BSN, RN, CWS, Kim RN, BSN Entered By: Elliot Gurney, BSN, RN, CWS, Kim on 06/28/2021 14:45:39 [name] ([id number]) -------------------------------------------------------------------------------- Pain Assessment Details Patient Name: [name] Date of Service: 06/28/2021 2:00 PM Medical Record Number: [id number] Patient Account Number: [id number] Date of Birth/Sex: 04-27-1951 (70 y.o. M) Treating RN: Huel Coventry Primary Care Lucina Betty: Beverely Low Other Clinician: Referring Jaxsyn Azam: Beverely Low Treating Culver Feighner/Extender: Rowan Blase in Treatment: 8  Active Problems Location of Pain Severity and Description of Pain Patient Has Paino No Site Locations Pain Management and Medication Current Pain Management: Notes Patient denies pain at this time Electronic Signature(s) Signed: 07/01/2021 3:17:27 PM By: Elliot Gurney, BSN, RN, CWS, Kim RN, BSN Entered By: Elliot Gurney, BSN, RN, CWS, Kim on 06/28/2021 14:36:07 [name] ([id number]) -------------------------------------------------------------------------------- Patient/Caregiver Education Details Patient Name: [name] Date of Service: 06/28/2021 2:00 PM Medical Record Number: [id number] Patient Account Number:  [id number] Date of Birth/Gender: 1951-02-23 (70 y.o. M) Treating RN: Huel Coventry Primary Care Physician: Beverely Low Other Clinician: Referring Physician: Beverely Low Treating Physician/Extender: Rowan Blase in Treatment: 8 Education Assessment Education Provided To: Patient Education Topics Provided Wound Debridement: Handouts: Wound Debridement Methods: Demonstration, Explain/Verbal Responses: State content correctly Wound/Skin Impairment: Handouts: Caring for Your Ulcer Methods: Demonstration, Explain/Verbal Responses: State content correctly Electronic Signature(s) Signed: 07/01/2021 3:17:27 PM By: Elliot Gurney, BSN, RN, CWS, Kim RN, BSN Entered By: Elliot Gurney, BSN, RN, CWS, Kim on 06/28/2021 15:09:23 [name] ([id number]) -------------------------------------------------------------------------------- Wound Assessment Details Patient Name: [name], [name] Date of Service: 06/28/2021 2:00 PM Medical Record Number: [id number] Patient Account Number: [id number] Date of Birth/Sex: May 10, 1951 (70 y.o. M) Treating RN: Huel Coventry Primary Care Caroleena Paolini: Beverely Low Other Clinician: Referring Shermon Bozzi: Beverely Low Treating Esra Frankowski/Extender: Rowan Blase in Treatment: 8 Wound Status Wound Number: 1 Primary Diabetic Wound/Ulcer of the Lower Extremity Etiology: Wound Location: Right, Proximal, Lateral Foot Wound Open Wounding Event: Gradually Appeared Status: Date Acquired: 05/03/2020 Comorbid Cataracts, Lymphedema, Deep Vein Thrombosis, Weeks Of Treatment: 8 History: Hypertension, Type II Diabetes, Osteoarthritis, Clustered Wound: No Osteomyelitis Photos Wound Measurements Length: (cm) 1.3 Width: (cm) 1 Depth: (cm) 0.2 Area: (cm) 1.021 Volume: (cm) 0.204 % Reduction in Area: 65.8% % Reduction in Volume: 88.6% Epithelialization: None Tunneling: No Undermining: No Wound Description Classification: Grade 2 Exudate Amount: Medium Exudate Type:  Serosanguineous Exudate Color: red, brown Foul Odor After Cleansing: No Slough/Fibrino No Wound Bed Granulation Amount: Medium (34-66%) Exposed Structure Granulation Quality: Red Fascia Exposed: No Necrotic Amount: Medium (34-66%) Fat Layer (Subcutaneous Tissue) Exposed: Yes Necrotic Quality: Adherent Slough Tendon Exposed: No Muscle Exposed: No Joint Exposed: No Bone Exposed: No Treatment Notes Wound #1 (Foot) Wound Laterality: Right, Lateral, Proximal Cleanser Wound Cleanser Discharge Instruction: Wash your hands with soap and water. Remove old dressing, discard into plastic bag and place into trash. Cleanse the wound with Wound Cleanser prior to applying a clean dressing using gauze sponges, not tissues or cotton balls. Do not scrub or use excessive force. Pat dry using gauze sponges, not tissue or cotton balls. MALIKAH, LAKEY ([id number]) Peri-Wound Care Topical Primary Dressing Hydrofera Blue Ready Transfer Foam, 2.5x2.5 (in/in) Discharge Instruction: Apply Hydrofera Blue Ready to wound bed as directed Secondary Dressing ABD Pad 5x9 (in/in) Discharge Instruction: Cover with ABD pad Secured With Compression Wrap Profore Lite LF 3 Multilayer Compression Bandaging System Discharge Instruction: Apply 3 multi-layer wrap as prescribed. Compression Stockings Facilities manager) Signed: 07/01/2021 3:17:27 PM By: Elliot Gurney, BSN, RN, CWS, Kim RN, BSN Entered By: Elliot Gurney, BSN, RN, CWS, Kim on 06/28/2021 14:43:57 [name] ([id number]) -------------------------------------------------------------------------------- Vitals Details Patient Name: [name] Date of Service: 06/28/2021 2:00 PM Medical Record Number: [id number] Patient Account Number: [id number] Date of Birth/Sex: April 19, 1951 (70 y.o. M) Treating RN: Huel Coventry Primary Care Benecio Kluger: Beverely Low Other Clinician: Referring Gayathri Futrell: Beverely Low Treating Kennidee Heyne/Extender: Rowan Blase in Treatment: 8 Vital Signs Time Taken: 14:34 Temperature (F): 98.7 Height (in): 71 Pulse (bpm):  90 Weight (lbs): 255 Respiratory Rate (breaths/min): 18 Body Mass Index (BMI): 35.6 Blood Pressure (mmHg): 137/71 Reference Range: 80 - 120 mg / dl Electronic Signature(s) Signed: 07/01/2021 3:17:27 PM By: Elliot Gurney, BSN, RN, CWS, Kim RN, BSN Entered By: Elliot Gurney, BSN, RN, CWS, Kim on 06/28/2021 14:35:08

## 2021-06-28 NOTE — Progress Notes (Addendum)
JEWELL, DESTA ([id number]) Visit Report for 06/28/2021 Chief Complaint Document Details Patient Name: [name], [name]. Date of Service: 06/28/2021 2:00 PM Medical Record Number: [id number] Patient Account Number: [id number] Date of Birth/Sex: 1950/12/05 (70 y.o. M) Treating RN: Huel Coventry Primary Care Provider: Beverely Low Other Clinician: Referring Provider: Beverely Low Treating Provider/Extender: Rowan Blase in Treatment: 8 Information Obtained from: Patient Chief Complaint Bilateral foot ulcers Electronic Signature(s) Signed: 06/28/2021 2:13:33 PM By: Lenda Kelp PA-C Entered By: Lenda Kelp on 06/28/2021 14:13:32 [name] ([id number]) -------------------------------------------------------------------------------- Debridement Details Patient Name: [name] Date of Service: 06/28/2021 2:00 PM Medical Record Number: [id number] Patient Account Number: [id number] Date of Birth/Sex: 10/16/1950 (70 y.o. M) Treating RN: Huel Coventry Primary Care Provider: Beverely Low Other Clinician: Referring Provider: Beverely Low Treating Provider/Extender: Rowan Blase in Treatment: 8 Debridement Performed for Wound #1 Right,Proximal,Lateral Foot Assessment: Performed By: Physician Nelida Meuse., PA-C Debridement Type: Debridement Severity of Tissue Pre Debridement: Fat layer exposed Level of Consciousness (Pre- Awake and Alert procedure): Pre-procedure Verification/Time Out Yes - 14:55 Taken: Total Area Debrided (L x W): 1.3 (cm) x 1 (cm) = 1.3 (cm) Tissue and other material Callus, Slough, Subcutaneous, Slough debrided: Level: Skin/Subcutaneous Tissue Debridement Description: Excisional Instrument: Curette Bleeding: Minimum Hemostasis Achieved: Pressure Response to Treatment: Procedure was tolerated well Level of Consciousness (Post- Awake and Alert procedure): Post Debridement Measurements of Total Wound Length: (cm)  1.3 Width: (cm) 1 Depth: (cm) 0.2 Volume: (cm) 0.204 Character of Wound/Ulcer Post Debridement: Stable Severity of Tissue Post Debridement: Limited to breakdown of skin Post Procedure Diagnosis Same as Pre-procedure Electronic Signature(s) Signed: 06/28/2021 7:05:42 PM By: Lenda Kelp PA-C Signed: 07/01/2021 3:17:27 PM By: Elliot Gurney, BSN, RN, CWS, Kim RN, BSN Entered By: Elliot Gurney, BSN, RN, CWS, Kim on 06/28/2021 14:56:18 [name] ([id number]) -------------------------------------------------------------------------------- HPI Details Patient Name: [name] Date of Service: 06/28/2021 2:00 PM Medical Record Number: [id number] Patient Account Number: [id number] Date of Birth/Sex: 08-Aug-1951 (70 y.o. 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 Has been seen by [facility]iatry [facility] for a number of years it sounds like at least 4 years. With that being said they have tried a split thickness skin graft along with multiple other topical remedies over the time they have been seeing him over the past several years. Unfortunately they are not able to get this closed I did refer him to [place] for further evaluation and treatment. The patient tells me has been quite sometime since he remembers having an x-ray and again we could not find anything recent when looking through his chart. Fortunately there does not appear to be any signs of active infection systemically which is great news. No fevers, chills, nausea,  vomiting, or diarrhea. The patient does have diabetes with his most recent hemoglobin A1c being November 14, 2020 at 9.1. He also does have evidence of lymphedema based on what I am seeing today. 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 05/10/2021 upon evaluation today patient appears to be doing well with regard to his wounds in general. I feel like he is making progress. Fortunately the compression wrap did well for him and overall I am extremely happy with where we stand. No fevers, chills, nausea, vomiting, or diarrhea. 05/17/2021 upon evaluation today patient appears to be doing better in general regard to his wounds. Overall I feel like he is actually signs of good improvement which is great news and I think that we are headed in the right direction. There does not appear to be any signs of active infection at this time. No fevers, chills, nausea, vomiting, or diarrhea. 05/24/2021 upon evaluation today patient appears to be doing well with regard to his wounds. He is actually showing signs of improvement this is great news. Its mainly the right foot proximal wound that is going to require debridement today and again I do need to try to clear away some of the lip where he had some epiboly I then work on this week by week but if we get that cleared away I think this will heal much more effectively and quickly. 05/31/2021 upon evaluation today patient appears to be doing well at this point in regard to his wounds. Both are showing signs of improvement and very pleased in that regard. Fortunately there does not appear to be any evidence of active infection systemically which is great news. No fevers, chills, nausea, vomiting, or diarrhea. 06/07/2021 upon evaluation today patient appears to be making excellent progress in regard to his foot ulcers.  Both are showing signs of excellent improvement. Fortunately there does not appear to be any evidence of active infection which is great news. No fevers, chills, nausea, vomiting, or diarrhea. 06/21/2021 upon evaluation today patient appears to be doing well with regard to his wound. In fact he is making excellent progress and I am extremely pleased with where things stand today. There does not appear to be any signs of active infection at this time. 06/28/2021 upon evaluation patient's foot ulcer actually is showing signs of good improvement. I am actually extremely pleased with how we stand today and I think that he does require some sharp debridement at this point but nothing in comparison what we have had to do before. Overall I think were headed in the appropriate direction he is very pleased to hear this. Electronic Signature(s) Signed: 06/28/2021 6:23:53 PM By: Lenda Kelp PA-C Entered By: Lenda Kelp on 06/28/2021 18:23:53 [name] ([id number]) -------------------------------------------------------------------------------- Physical Exam Details Patient Name: [name], [name] Date of Service: 06/28/2021 2:00 PM Medical Record Number: [id number] Patient Account Number: [id number] Date of Birth/Sex: Jul 27, 1951 (70 y.o. M) Treating RN: Huel Coventry Primary Care Provider: Beverely Low Other Clinician: Referring Provider: Beverely Low Treating Provider/Extender: Rowan Blase in Treatment: 8 Constitutional Well-nourished and well-hydrated in no acute distress. Respiratory normal breathing without difficulty. Psychiatric this patient is able to make decisions and demonstrates good insight into disease process. Alert and Oriented x 3. pleasant and cooperative. Notes Upon inspection patient's wound bed actually showed signs of good granulation epithelization at this point. Fortunately I do not see any evidence of active infection systemically which is great news  and overall I am extremely pleased with where we stand currently. Electronic Signature(s) Signed: 06/28/2021 6:24:08 PM By: Lenda Kelp PA-C Entered By: Lenda Kelp on 06/28/2021 18:24:08 [name] ([id number]) -------------------------------------------------------------------------------- Physician Orders Details Patient  Name: [name], [name]. Date of Service: 06/28/2021 2:00 PM Medical Record Number: [id number] Patient Account Number: [id number] Date of Birth/Sex: 20-Apr-1951 (70 y.o. 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 o Nurse Visit as needed Bathing/ Shower/ Hygiene o May shower with wound dressing protected with water repellent cover or cast protector. o No tub bath. Edema Control - Lymphedema / Segmental Compressive Device / Other Right Lower Extremity o Optional: One layer of unna paste to top of compression wrap (to act as an anchor). o Elevate legs to the level of the heart and pump ankles as often as possible o Elevate leg(s) parallel to the floor when sitting. o Compression Pump: Use compression pump on left lower extremity for 60 minutes, twice daily. o Compression Pump: Use compression pump on right lower extremity for 60 minutes, twice daily. Wound  Treatment Wound #1 - Foot Wound Laterality: Right, Lateral, Proximal Cleanser: Wound Cleanser 1 x Per Week/30 Days Discharge Instructions: Wash your hands with soap and water. Remove old dressing, discard into plastic bag and place into trash. Cleanse the wound with Wound Cleanser prior to applying a clean dressing using gauze sponges, not tissues or cotton balls. Do not scrub or use excessive force. Pat dry using gauze sponges, not tissue or cotton balls. Primary Dressing: Hydrofera Blue Ready Transfer Foam, 2.5x2.5 (in/in) 1 x Per Week/30 Days Discharge Instructions: Apply Hydrofera Blue Ready to wound bed as directed Secondary Dressing: ABD Pad 5x9 (in/in) 1 x Per Week/30 Days Discharge Instructions: Cover with ABD pad Compression Wrap: Profore Lite LF 3 Multilayer Compression Bandaging System 1 x Per Week/30 Days Discharge Instructions: Apply 3 multi-layer wrap as prescribed. Electronic Signature(s) Signed: 06/28/2021 7:05:42 PM By: Lenda Kelp PA-C Signed: 07/01/2021 3:17:27 PM By: Elliot Gurney, BSN, RN, CWS, Kim RN, BSN Entered By: Elliot Gurney, BSN, RN, CWS, Kim on 06/28/2021 15:08:58 [name] ([id number]) -------------------------------------------------------------------------------- Problem List Details Patient Name: [name], [name] Date of Service: 06/28/2021 2:00 PM Medical Record Number: [id number] Patient Account Number: [id number] Date of Birth/Sex: [birth year] (70 y.o. M) Treating RN: Huel Coventry Primary Care Provider: Beverely Low Other Clinician: Referring Provider: Beverely Low Treating Provider/Extender: Rowan Blase in Treatment: 8 Active Problems ICD-10 Encounter Code Description Active Date MDM Diagnosis E11.621 Type 2 diabetes mellitus with foot ulcer 05/03/2021 No Yes L97.512 Non-pressure chronic ulcer of other part of right foot with fat layer 05/03/2021 No Yes exposed L97.522 Non-pressure chronic ulcer of other part of left foot with fat layer 05/03/2021  No Yes exposed Z89.421 Acquired absence of other right toe(s) 05/03/2021 No Yes I10 Essential (primary) hypertension 05/03/2021 No Yes Z79.01 Long term (current) use of anticoagulants 05/03/2021 No Yes Z86.718 Personal history of other venous thrombosis and embolism 05/03/2021 No Yes Inactive Problems Resolved Problems Electronic Signature(s) Signed: 06/28/2021 2:13:23 PM By: Lenda Kelp PA-C Entered By: Lenda Kelp on 06/28/2021 14:13:23 [name] ([id number]) -------------------------------------------------------------------------------- Progress Note Details Patient  Name: [name], [name]. Date of Service: 06/28/2021 2:00 PM Medical Record Number: [id number] Patient Account Number: [id number] Date of Birth/Sex: 1950/10/12 (70 y.o. 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 Has been seen by [facility]iatry [facility] for a number of years it sounds like at least 4 years. With that being said they have tried a split thickness skin graft along with multiple other topical remedies over the time they have been seeing him over the past several years. Unfortunately they are not able to get this closed I did refer him to [place] for further evaluation and treatment. The patient tells me has been quite sometime since he remembers having an x-ray and again we could not find anything recent when looking through his chart. Fortunately there does not appear  to be any signs of active infection systemically which is great news. No fevers, chills, nausea, vomiting, or diarrhea. The patient does have diabetes with his most recent hemoglobin A1c being November 14, 2020 at 9.1. He also does have evidence of lymphedema based on what I am seeing today. 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 05/10/2021 upon evaluation today patient appears to be doing well with regard to his wounds in general. I feel like he is making progress. Fortunately the compression wrap did well for him and overall I am extremely happy with where we stand. No fevers, chills, nausea, vomiting, or diarrhea. 05/17/2021 upon evaluation today patient appears to be doing better in general regard to his wounds. Overall I feel like he is actually signs of good improvement which is great news and I think that we are headed in the right direction. There does not appear to be any signs of active infection at this time. No fevers, chills, nausea, vomiting, or diarrhea. 05/24/2021 upon evaluation today patient appears to be doing well with regard to his wounds. He is actually showing signs of improvement this is great news. Its mainly the right foot proximal wound that is going to require debridement today and again I do need to try to clear away some of the lip where he had some epiboly I then work on this week by week but if we get that cleared away I think this will heal much more effectively and quickly. 05/31/2021 upon evaluation today patient appears to be doing well at this point in regard to his wounds. Both are showing signs of improvement and very pleased in that regard. Fortunately there does not appear to be any evidence of active infection systemically which is great news. No fevers, chills, nausea, vomiting, or diarrhea. 06/07/2021 upon  evaluation today patient appears to be making excellent progress in regard to his foot ulcers. Both are showing signs of excellent improvement. Fortunately there does not appear to be any evidence of active infection which is great news. No fevers, chills, nausea, vomiting, or diarrhea. 06/21/2021 upon  evaluation today patient appears to be doing well with regard to his wound. In fact he is making excellent progress and I am extremely pleased with where things stand today. There does not appear to be any signs of active infection at this time. 06/28/2021 upon evaluation patient's foot ulcer actually is showing signs of good improvement. I am actually extremely pleased with how we stand today and I think that he does require some sharp debridement at this point but nothing in comparison what we have had to do before. Overall I think were headed in the appropriate direction he is very pleased to hear this. Objective Constitutional Well-nourished and well-hydrated in no acute distress. Vitals Time Taken: 2:34 PM, Height: 71 in, Weight: 255 lbs, BMI: 35.6, Temperature: 98.7 F, Pulse: 90 bpm, Respiratory Rate: 18 breaths/min, Blood Pressure: 137/71 mmHg. Respiratory [name], [name] ([id number]) normal breathing without difficulty. Psychiatric this patient is able to make decisions and demonstrates good insight into disease process. Alert and Oriented x 3. pleasant and cooperative. General Notes: Upon inspection patient's wound bed actually showed signs of good granulation epithelization at this point. Fortunately I do not see any evidence of active infection systemically which is great news and overall I am extremely pleased with where we stand currently. Integumentary (Hair, Skin) Wound #1 status is Open. Original cause of wound was Gradually Appeared. The date acquired was: 05/03/2020. The wound has been in treatment 8 weeks. The wound is located on the Right,Proximal,Lateral Foot. The wound  measures 1.3cm length x 1cm width x 0.2cm depth; 1.021cm^2 area and 0.204cm^3 volume. There is Fat Layer (Subcutaneous Tissue) exposed. There is no tunneling or undermining noted. There is a medium amount of serosanguineous drainage noted. There is medium (34-66%) red granulation within the wound bed. There is a medium (34-66%) amount of necrotic tissue within the wound bed including Adherent Slough. 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 There was a Excisional Skin/Subcutaneous Tissue Debridement with a total area of 1.3 sq cm performed by Nelida Meuse., PA-C. With the following instrument(s): Curette Material removed includes Callus, Subcutaneous Tissue, and Slough. No specimens were taken. A time out was conducted at 14:55, prior to the start of the procedure. A Minimum amount of bleeding was controlled with Pressure. The procedure was tolerated well. Post Debridement Measurements: 1.3cm length x 1cm width x 0.2cm depth; 0.204cm^3 volume. Character of Wound/Ulcer Post Debridement is stable. Severity of Tissue Post Debridement is: Limited to breakdown of skin. Post procedure Diagnosis Wound #1: Same as Pre-Procedure Plan Follow-up Appointments: Return Appointment in 1 week. Nurse Visit as needed Bathing/ Shower/ Hygiene: May shower with wound dressing protected with water repellent cover or cast protector. No tub bath. Edema  Control - Lymphedema / Segmental Compressive Device / Other: Optional: One layer of unna paste to top of compression wrap (to act as an anchor). Elevate legs to the level of the heart and pump ankles as often as possible Elevate leg(s) parallel to the floor when sitting. Compression Pump: Use compression  pump on left lower extremity for 60 minutes, twice daily. Compression Pump: Use compression pump on right lower extremity for 60 minutes, twice daily. WOUND #1: - Foot Wound Laterality: Right, Lateral, Proximal Cleanser: Wound Cleanser 1 x Per Week/30 Days Discharge Instructions: Wash your hands with soap and water. Remove old dressing, discard into plastic bag and place into trash. Cleanse the wound with Wound Cleanser prior to applying a clean dressing using gauze sponges, not tissues or cotton balls. Do not scrub or use excessive force. Pat dry using gauze sponges, not tissue or cotton balls. Primary Dressing: Hydrofera Blue Ready Transfer Foam, 2.5x2.5 (in/in) 1 x Per Week/30 Days Discharge Instructions: Apply Hydrofera Blue Ready to wound bed as directed [name], [name] ([id number]) Secondary Dressing: ABD Pad 5x9 (in/in) 1 x Per Week/30 Days Discharge Instructions: Cover with ABD pad Compression Wrap: Profore Lite LF 3 Multilayer Compression Bandaging System 1 x Per Week/30 Days Discharge Instructions: Apply 3 multi-layer wrap as prescribed. 1. I am good recommend that we going continue with the wound care measures as before with regard to the [facility] [employer] [facility] I think this is still an awesome option for him. 2. Also can recommend that we have the patient continue with the 3 layer compression wrap that is doing a great job keeping edema under control. We will see patient back for reevaluation in 1 week here in the clinic. If anything worsens or changes patient will contact our office for additional recommendations. Electronic Signature(s) Signed: 06/28/2021 6:24:32 PM By: Lenda Kelp PA-C Entered By: Lenda Kelp on 06/28/2021 18:24:31 [name] ([id number]) -------------------------------------------------------------------------------- SuperBill Details Patient Name: [name] Date of Service: 06/28/2021 Medical Record Number: [id number] Patient Account Number: [id number] Date of Birth/Sex: 06/19/1951 (70 y.o. 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.522 Non-pressure chronic ulcer of other part of left foot with fat layer exposed Z89.421 Acquired absence of other right toe(s) I10 Essential (primary) hypertension Z79.01 Long term (current) use of anticoagulants Z86.718 Personal history of other venous thrombosis and embolism Facility Procedures CPT4 Code: [id number] Description: 11042 - DEB SUBQ TISSUE 20 SQ CM/< Modifier: Quantity: 1 CPT4 Code: Description: ICD-10 Diagnosis Description L97.512 Non-pressure chronic ulcer of other part of right foot with fat layer ex Modifier: posed Quantity: Physician Procedures CPT4 Code: [id number] Description: 11042 - WC PHYS SUBQ TISS 20 SQ CM Modifier: Quantity: 1 CPT4 Code: Description: ICD-10 Diagnosis Description L97.512 Non-pressure chronic ulcer of other part of right foot with fat layer ex Modifier: posed Quantity: Electronic Signature(s) Signed: 06/28/2021 6:25:17 PM By: Lenda Kelp PA-C Entered By: Lenda Kelp on 06/28/2021 18:25:17

## 2021-07-05 ENCOUNTER — Other Ambulatory Visit: Payer: Self-pay

## 2021-07-05 ENCOUNTER — Encounter: Payer: Medicare HMO | Attending: Physician Assistant | Admitting: Physician Assistant

## 2021-07-05 DIAGNOSIS — Z7901 Long term (current) use of anticoagulants: Secondary | ICD-10-CM | POA: Insufficient documentation

## 2021-07-05 DIAGNOSIS — Z86718 Personal history of other venous thrombosis and embolism: Secondary | ICD-10-CM | POA: Insufficient documentation

## 2021-07-05 DIAGNOSIS — Z89421 Acquired absence of other right toe(s): Secondary | ICD-10-CM | POA: Diagnosis not present

## 2021-07-05 DIAGNOSIS — I10 Essential (primary) hypertension: Secondary | ICD-10-CM | POA: Diagnosis not present

## 2021-07-05 DIAGNOSIS — L97522 Non-pressure chronic ulcer of other part of left foot with fat layer exposed: Secondary | ICD-10-CM | POA: Insufficient documentation

## 2021-07-05 DIAGNOSIS — L97512 Non-pressure chronic ulcer of other part of right foot with fat layer exposed: Secondary | ICD-10-CM | POA: Insufficient documentation

## 2021-07-05 DIAGNOSIS — E11621 Type 2 diabetes mellitus with foot ulcer: Secondary | ICD-10-CM | POA: Diagnosis not present

## 2021-07-05 NOTE — Progress Notes (Signed)
[name], [name] ([id number]) Visit Report for 07/05/2021 Arrival Information Details Patient Name: [name] Date of Service: 07/05/2021 12:30 PM Medical Record Number: [id number] Patient Account Number: [id number] Date of Birth/Sex: 15-Dec-1950 (70 y.o. M) Treating RN: Cornell Barman Primary Care Norvin Ohlin: Beverlyn Roux Other Clinician: Referring Jaryn Hocutt: Beverlyn Roux Treating Nyra Anspaugh/Extender: Skipper Cliche in Treatment: 9 Visit Information History Since Last Visit Added or deleted any medications: No Patient Arrived: Walker Has Dressing in Place as Prescribed: Yes Arrival Time: 12:57 Has Compression in Place as Prescribed: Yes Transfer Assistance: None Pain Present Now: No Patient Has Alerts: Yes Patient Alerts: Patient on Blood Thinner Type II Diabetic 81mg  aspirin Electronic Signature(s) Signed: 07/05/2021 1:15:18 PM By: Gretta Cool, BSN, RN, CWS, Kim RN, BSN Entered By: Gretta Cool, BSN, RN, CWS, Kim on 07/05/2021 13:01:12 [name] ([id number]) -------------------------------------------------------------------------------- Compression Therapy Details Patient Name: [name] Date of Service: 07/05/2021 12:30 PM Medical Record Number: [id number] Patient Account Number: [id number] Date of Birth/Sex: 12/20/1950 (70 y.o. M) Treating RN: Cornell Barman Primary Care Lucienne Sawyers: Beverlyn Roux Other Clinician: Referring Mae Denunzio: Beverlyn Roux Treating Chelly Dombeck/Extender: Skipper Cliche in Treatment: 9 Compression Therapy Performed for Wound Assessment: Wound #1 Right,Proximal,Lateral Foot Performed By: Clinician Cornell Barman, RN Compression Type: Three Layer Post Procedure Diagnosis Same as Pre-procedure Notes Patient tolerating wraps well. Electronic Signature(s) Signed: 07/05/2021 1:15:18 PM By: Gretta Cool, BSN, RN, CWS, Kim RN, BSN Entered By: Gretta Cool, BSN, RN, CWS, Kim on 07/05/2021 13:13:07 [name]  ([id number]) -------------------------------------------------------------------------------- Encounter Discharge Information Details Patient Name: [name], [name] Date of Service: 07/05/2021 12:30 PM Medical Record Number: [id number] Patient Account Number: [id number] Date of Birth/Sex: 1951-01-22 (70 y.o. M) Treating RN: Cornell Barman Primary Care Shields Pautz: Beverlyn Roux Other Clinician: Referring Troyce Gieske: Beverlyn Roux Treating Alaynah Schutter/Extender: Skipper Cliche in Treatment: 9 Encounter Discharge Information Items Discharge Condition: Stable Ambulatory Status: Walker Discharge Destination: Home Schedule Follow-up Appointment: Yes Clinical Summary of Care: Electronic Signature(s) Signed: 07/05/2021 1:15:18 PM By: Gretta Cool, BSN, RN, CWS, Kim RN, BSN Entered By: Gretta Cool, BSN, RN, CWS, Kim on 07/05/2021 13:14:56 [name] ([id number]) -------------------------------------------------------------------------------- Lower Extremity Assessment Details Patient Name: [name], [name] Date of Service: 07/05/2021 12:30 PM Medical Record Number: [id number] Patient Account Number: [id number] Date of Birth/Sex: 03-10-1951 (70 y.o. M) Treating RN: Cornell Barman Primary Care Dearius Hoffmann: Beverlyn Roux Other Clinician: Referring Terri Malerba: Beverlyn Roux Treating Jasmynn Pfalzgraf/Extender: Skipper Cliche in Treatment: 9 Edema Assessment Assessed: [Left: No] [Right: No] [Left: Edema] [Right: :] Calf Left: Right: [name] of Measurement: 31 cm From Medial Instep 42.5 cm Ankle Left: Right: [name] of Measurement: 10 cm From Medial Instep 27 cm Vascular Assessment Pulses: Dorsalis Pedis Palpable: [Right:Yes] Electronic Signature(s) Signed: 07/05/2021 1:15:18 PM By: Gretta Cool, BSN, RN, CWS, Kim RN, BSN Entered By: Gretta Cool, BSN, RN, CWS, Kim on 07/05/2021 13:03:55 [name] ([id number]) -------------------------------------------------------------------------------- Multi Wound Chart  Details Patient Name: [name] Date of Service: 07/05/2021 12:30 PM Medical Record Number: [id number] Patient Account Number: [id number] Date of Birth/Sex: 1950-10-13 (70 y.o. M) Treating RN: Cornell Barman Primary Care Carry Ortez: Beverlyn Roux Other Clinician: Referring Shamaria Kavan: Beverlyn Roux Treating Sonnie Bias/Extender: Skipper Cliche in Treatment: 9 Vital Signs Height(in): 6 Pulse(bpm): 98 Weight(lbs): 255 Blood Pressure(mmHg): 127/66 Body Mass Index(BMI): 36 Temperature(F): 97.7 Respiratory Rate(breaths/min): 16 Photos: [N/A:N/A] Wound Location: Right, Proximal, Lateral Foot N/A N/A Wounding Event: Gradually Appeared N/A N/A Primary Etiology: Diabetic Wound/Ulcer of the Lower N/A N/A Extremity Comorbid History: Cataracts, Lymphedema, Deep Vein N/A N/A Thrombosis, Hypertension, Type II Diabetes, Osteoarthritis, Osteomyelitis  Date Acquired: 05/03/2020 N/A N/A Weeks of Treatment: 9 N/A N/A Wound Status: Open N/A N/A Measurements L x W x D (cm) 1.4x1.5x0.2 N/A N/A Area (cm) : 1.649 N/A N/A Volume (cm) : 0.33 N/A N/A % Reduction in Area: 44.80% N/A N/A % Reduction in Volume: 81.60% N/A N/A Classification: Grade 2 N/A N/A Exudate Amount: Medium N/A N/A Exudate Type: Serosanguineous N/A N/A Exudate Color: red, brown N/A N/A Granulation Amount: Medium (34-66%) N/A N/A Granulation Quality: Red N/A N/A Necrotic Amount: Medium (34-66%) N/A N/A Exposed Structures: Fat Layer (Subcutaneous Tissue): N/A N/A Yes Fascia: No Tendon: No Muscle: No Joint: No Bone: No Epithelialization: None N/A N/A Treatment Notes Electronic Signature(s) Signed: 07/05/2021 1:15:18 PM By: Gretta Cool, BSN, RN, CWS, Kim RN, BSN Entered By: Gretta Cool, BSN, RN, CWS, Kim on 07/05/2021 13:12:38 [name] ([id number]) -------------------------------------------------------------------------------- [place] Details Patient Name: [name], [name]. Date of Service:  07/05/2021 12:30 PM Medical Record Number: [id number] Patient Account Number: [id number] Date of Birth/Sex: [birth year] (70 y.o. M) Treating RN: Cornell Barman Primary Care Weyman Bogdon: Beverlyn Roux Other Clinician: Referring Shrihaan Porzio: Beverlyn Roux Treating Kindred Reidinger/Extender: Skipper Cliche in Treatment: 9 Active Inactive Abuse / Safety / Falls / Self Care Management Nursing Diagnoses: Potential for falls Potential for injury related to falls Goals: Patient will not experience any injury related to falls Date Initiated: 05/03/2021 Target Resolution Date: 06/02/2021 Goal Status: Active Patient will remain injury free related to falls Date Initiated: 05/03/2021 Target Resolution Date: 06/02/2021 Goal Status: Active Patient/caregiver will verbalize understanding of skin care regimen Date Initiated: 05/03/2021 Target Resolution Date: 05/03/2021 Goal Status: Active Interventions: Call light and/or bell within patient's reach Podiatry chair, stretcher in low position and side rails up as needed Assess Activities of Daily Living upon admission and as needed Assess fall risk on admission and as needed Assess: immobility, friction, shearing, incontinence upon admission and as needed Assess impairment of mobility on admission and as needed per policy Notes: Necrotic Tissue Nursing Diagnoses: Impaired tissue integrity related to necrotic/devitalized tissue Knowledge deficit related to management of necrotic/devitalized tissue Goals: Necrotic/devitalized tissue will be minimized in the wound bed Date Initiated: 05/03/2021 Target Resolution Date: 05/03/2021 Goal Status: Active Patient/caregiver will verbalize understanding of reason and process for debridement of necrotic tissue Date Initiated: 05/03/2021 Target Resolution Date: 05/03/2021 Goal Status: Active Interventions: Assess patient pain level pre-, during and post procedure and prior to discharge Provide education on necrotic tissue and debridement  process Treatment Activities: Apply topical anesthetic as ordered : 05/03/2021 Biologic debridement : 05/03/2021 Enzymatic debridement : 05/03/2021 Excisional debridement : 05/03/2021 Notes: GARANG, WILBUR ([id number]) Wound/Skin Impairment Nursing Diagnoses: Impaired tissue integrity Goals: Patient/caregiver will verbalize understanding of skin care regimen Date Initiated: 05/03/2021 Target Resolution Date: 05/03/2021 Goal Status: Active Ulcer/skin breakdown will have a volume reduction of 30% by week 4 Date Initiated: 05/03/2021 Target Resolution Date: 06/02/2021 Goal Status: Active Ulcer/skin breakdown will have a volume reduction of 50% by week 8 Date Initiated: 05/03/2021 Target Resolution Date: 07/03/2021 Goal Status: Active Ulcer/skin breakdown will have a volume reduction of 80% by week 12 Date Initiated: 05/03/2021 Target Resolution Date: 08/02/2021 Goal Status: Active Ulcer/skin breakdown will heal within 14 weeks Date Initiated: 05/03/2021 Target Resolution Date: 09/02/2021 Goal Status: Active Interventions: Assess patient/caregiver ability to obtain necessary supplies Assess patient/caregiver ability to perform ulcer/skin care regimen upon admission and as needed Assess ulceration(s) every visit Provide education on ulcer and skin care Treatment Activities: Referred to DME Calyssa Zobrist for dressing supplies : 05/03/2021 Skin  care regimen initiated : 05/03/2021 Notes: Electronic Signature(s) Signed: 07/05/2021 1:15:18 PM By: Gretta Cool, BSN, RN, CWS, Kim RN, BSN Entered By: Gretta Cool, BSN, RN, CWS, Kim on 07/05/2021 13:12:31 [name] ([id number]) -------------------------------------------------------------------------------- Pain Assessment Details Patient Name: [name], [name] Date of Service: 07/05/2021 12:30 PM Medical Record Number: [id number] Patient Account Number: [id number] Date of Birth/Sex: 1950/12/16 (70 y.o. M) Treating RN: Cornell Barman Primary Care Shoshanna Mcquitty: Beverlyn Roux Other Clinician: Referring Lavaughn Bisig: Beverlyn Roux Treating Chaz Mcglasson/Extender: Skipper Cliche in Treatment: 9 Active Problems Location of Pain Severity and Description of Pain Patient Has Paino No Site Locations Pain Management and Medication Current Pain Management: Electronic Signature(s) Signed: 07/05/2021 1:15:18 PM By: Gretta Cool, BSN, RN, CWS, Kim RN, BSN Entered By: Gretta Cool, BSN, RN, CWS, Kim on 07/05/2021 13:02:15 [name] ([id number]) -------------------------------------------------------------------------------- Patient/Caregiver Education Details Patient Name: [name] Date of Service: 07/05/2021 12:30 PM Medical Record Number: [id number] Patient Account Number: [id number] Date of Birth/Gender: [birth year] (70 y.o. M) Treating RN: Cornell Barman Primary Care Physician: Beverlyn Roux Other Clinician: Referring Physician: Beverlyn Roux Treating Physician/Extender: Skipper Cliche in Treatment: 9 Education Assessment Education Provided To: Patient Education Topics Provided Venous: Handouts: Controlling Swelling with Compression Stockings Methods: Demonstration, Explain/Verbal Responses: State content correctly Electronic Signature(s) Signed: 07/05/2021 1:15:18 PM By: Gretta Cool, BSN, RN, CWS, Kim RN, BSN Entered By: Gretta Cool, BSN, RN, CWS, Kim on 07/05/2021 13:14:24 [name] ([id number]) -------------------------------------------------------------------------------- Wound Assessment Details Patient Name: [name], [name] Date of Service: 07/05/2021 12:30 PM Medical Record Number: [id number] Patient Account Number: [id number] Date of Birth/Sex: July 07, 1951 (70 y.o. M) Treating RN: Cornell Barman Primary Care Yasha Tibbett: Beverlyn Roux Other Clinician: Referring Spyridon Hornstein: Beverlyn Roux Treating Kadeen Sroka/Extender: Skipper Cliche in Treatment: 9 Wound Status Wound Number: 1 Primary Diabetic Wound/Ulcer of the Lower Extremity Etiology: Wound Location:  Right, Proximal, Lateral Foot Wound Open Wounding Event: Gradually Appeared Status: Date Acquired: 05/03/2020 Comorbid Cataracts, Lymphedema, Deep Vein Thrombosis, Weeks Of Treatment: 9 History: Hypertension, Type II Diabetes, Osteoarthritis, Clustered Wound: No Osteomyelitis Photos Wound Measurements Length: (cm) 1.4 Width: (cm) 1.5 Depth: (cm) 0.2 Area: (cm) 1.649 Volume: (cm) 0.33 % Reduction in Area: 44.8% % Reduction in Volume: 81.6% Epithelialization: None Tunneling: No Undermining: No Wound Description Classification: Grade 2 Exudate Amount: Medium Exudate Type: Serosanguineous Exudate Color: red, brown Foul Odor After Cleansing: No Slough/Fibrino No Wound Bed Granulation Amount: Medium (34-66%) Exposed Structure Granulation Quality: Red Fascia Exposed: No Necrotic Amount: Medium (34-66%) Fat Layer (Subcutaneous Tissue) Exposed: Yes Necrotic Quality: Adherent Slough Tendon Exposed: No Muscle Exposed: No Joint Exposed: No Bone Exposed: No Treatment Notes Wound #1 (Foot) Wound Laterality: Right, Lateral, Proximal Cleanser Wound Cleanser Discharge Instruction: Wash your hands with soap and water. Remove old dressing, discard into plastic bag and place into trash. Cleanse the wound with Wound Cleanser prior to applying a clean dressing using gauze sponges, not tissues or cotton balls. Do not scrub or use excessive force. Pat dry using gauze sponges, not tissue or cotton balls. [name], [name] ([id number]) Peri-Wound Care Topical Primary Dressing Hydrofera Blue Ready Transfer Foam, 2.5x2.5 (in/in) Discharge Instruction: Apply Hydrofera Blue Ready to wound bed as directed Secondary Dressing Zetuvit Plus Silicone Non-bordered 5x5 (in/in) Secured With Compression Wrap Profore Lite LF 3 Multilayer Compression Bandaging System Discharge Instruction: Apply 3 multi-layer wrap as prescribed. Compression Stockings Environmental education officer) Signed:  07/05/2021 1:15:18 PM By: Gretta Cool, BSN, RN, CWS, Kim RN, BSN Entered By: Gretta Cool, BSN, RN, CWS, Kim on 07/05/2021 13:03:34 [name] ([id number]) --------------------------------------------------------------------------------  Vitals Details Patient Name: [name], [name]. Date of Service: 07/05/2021 12:30 PM Medical Record Number: [id number] Patient Account Number: [id number] Date of Birth/Sex: 02-25-1951 (70 y.o. M) Treating RN: Huel Coventry Primary Care Mychal Decarlo: Beverely Low Other Clinician: Referring Kirkland Figg: Beverely Low Treating Makilah Dowda/Extender: Rowan Blase in Treatment: 9 Vital Signs Time Taken: 13:01 Temperature (F): 97.7 Height (in): 71 Pulse (bpm): 98 Weight (lbs): 255 Respiratory Rate (breaths/min): 16 Body Mass Index (BMI): 35.6 Blood Pressure (mmHg): 127/66 Reference Range: 80 - 120 mg / dl Electronic Signature(s) Signed: 07/05/2021 1:15:18 PM By: Elliot Gurney, BSN, RN, CWS, Kim RN, BSN Entered By: Elliot Gurney, BSN, RN, CWS, Kim on 07/05/2021 13:01:30

## 2021-07-05 NOTE — Progress Notes (Addendum)
[name], [name] ([id number]) Visit Report for 07/05/2021 Chief Complaint Document Details Patient Name: [name], [name]. Date of Service: 07/05/2021 12:30 PM Medical Record Number: [id number] Patient Account Number: [id number] Date of Birth/Sex: 10-01-1950 (70 y.o. M) Treating RN: Huel Coventry Primary Care Provider: Beverely Low Other Clinician: Referring Provider: Beverely Low Treating Provider/Extender: Rowan Blase in Treatment: 9 Information Obtained from: Patient Chief Complaint Bilateral foot ulcers Electronic Signature(s) Signed: 07/05/2021 1:06:56 PM By: Lenda Kelp PA-C Entered By: Lenda Kelp on 07/05/2021 13:06:55 [name] ([id number]) -------------------------------------------------------------------------------- HPI Details Patient Name: [name] Date of Service: 07/05/2021 12:30 PM Medical Record Number: [id number] Patient Account Number: [id number] Date of Birth/Sex: September 30, 1950 (70 y.o. 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 Has been seen by [facility]iatry [facility] for a number of years it sounds like at least 4 years. With that being said they have tried a split thickness skin graft along with multiple other topical remedies over the time they have been seeing him over the past several years. Unfortunately they are not able to get this closed I did refer him to [place] for further evaluation and treatment. The patient tells me has been quite sometime since he remembers  having an x-ray and again we could not find anything recent when looking through his chart. Fortunately there does not appear to be any signs of active infection systemically which is great news. No fevers, chills, nausea, vomiting, or diarrhea. The patient does have diabetes with his most recent hemoglobin A1c being November 14, 2020 at 9.1. He also does have evidence of lymphedema based on what I am seeing today. 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 05/10/2021 upon evaluation today patient appears to be doing well with regard to his wounds in general. I feel like he is making progress. Fortunately the compression wrap did well for him and overall I am extremely happy with where we stand. No fevers, chills, nausea, vomiting, or diarrhea. 05/17/2021 upon evaluation today patient appears to be doing better in general regard to his wounds. Overall I feel like he is actually signs of good improvement which is great news and I think that we are headed in the right direction. There does not appear to be any signs of active infection at this time. No fevers, chills, nausea, vomiting, or diarrhea. 05/24/2021 upon evaluation today patient appears to be doing well with regard to his wounds. He is actually showing signs of improvement this is great news. Its mainly the right foot proximal wound that is going to require debridement today and again I do need to try to clear away some of the lip where he had some epiboly I then work on this week by week but if we get that cleared away I think this will heal much more effectively and quickly. 05/31/2021 upon evaluation today patient appears to be doing well at this point in regard to  his wounds. Both are showing signs of improvement and very pleased in that regard. Fortunately there does not appear to be any  evidence of active infection systemically which is great news. No fevers, chills, nausea, vomiting, or diarrhea. 06/07/2021 upon evaluation today patient appears to be making excellent progress in regard to his foot ulcers. Both are showing signs of excellent improvement. Fortunately there does not appear to be any evidence of active infection which is great news. No fevers, chills, nausea, vomiting, or diarrhea. 06/21/2021 upon evaluation today patient appears to be doing well with regard to his wound. In fact he is making excellent progress and I am extremely pleased with where things stand today. There does not appear to be any signs of active infection at this time. 06/28/2021 upon evaluation patient's foot ulcer actually is showing signs of good improvement. I am actually extremely pleased with how we stand today and I think that he does require some sharp debridement at this point but nothing in comparison what we have had to do before. Overall I think were headed in the appropriate direction he is very pleased to hear this. 07/05/2021 upon evaluation today patient appears to be doing well with regard to his foot ulcer. In fact this is showing signs of excellent improvement which is great news. No fevers, chills, nausea, vomiting, or diarrhea. Electronic Signature(s) Signed: 07/05/2021 1:22:49 PM By: Lenda Kelp PA-C Entered By: Lenda Kelp on 07/05/2021 13:22:49 [name] ([id number]) -------------------------------------------------------------------------------- Physical Exam Details Patient Name: [name] Date of Service: 07/05/2021 12:30 PM Medical Record Number: [id number] Patient Account Number: [id number] Date of Birth/Sex: 05-01-1951 (70 y.o. M) Treating RN: Huel Coventry Primary Care Provider: Beverely Low Other Clinician: Referring Provider: Beverely Low Treating Provider/Extender: Rowan Blase in Treatment: 9 Constitutional Well-nourished and  well-hydrated in no acute distress. Respiratory normal breathing without difficulty. Psychiatric this patient is able to make decisions and demonstrates good insight into disease process. Alert and Oriented x 3. pleasant and cooperative. Notes Patient's wound does show signs of having quite a bit of drainage which I think is the biggest thing regular actually switch to using a Zetuvit dressing in order to try to catch the excessive drainage which I think will be beneficial for him the patient is in agreement with the plan. We will see patient back for reevaluation in 1 week here in the clinic. If anything worsens or changes patient will contact our office for additional recommendations. Electronic Signature(s) Signed: 07/05/2021 1:23:05 PM By: Lenda Kelp PA-C Entered By: Lenda Kelp on 07/05/2021 13:23:05 [name] ([id number]) -------------------------------------------------------------------------------- Physician Orders Details Patient Name: [name] Date of Service: 07/05/2021 12:30 PM Medical Record Number: [id number] Patient Account Number: [id number] Date of Birth/Sex: 1950/09/19 (70 y.o. 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 o Nurse Visit as needed Bathing/ Shower/  Hygiene o May shower with wound dressing protected with water  repellent cover or cast protector. o No tub bath. Edema Control - Lymphedema / Segmental Compressive Device / Other Right Lower Extremity o Optional: One layer of unna paste to top of compression wrap (to act as an anchor). o Elevate legs to the level of the heart and pump ankles as often as possible o Elevate leg(s) parallel to the floor when sitting. o Compression Pump: Use compression pump on left lower extremity for 60 minutes, twice daily. o Compression Pump: Use compression pump on right lower extremity for 60 minutes, twice daily. Wound Treatment Wound #1 - Foot Wound Laterality: Right, Lateral, Proximal Cleanser: Wound Cleanser 1 x Per Week/30 Days Discharge Instructions: Wash your hands with soap and water. Remove old dressing, discard into plastic bag and place into trash. Cleanse the wound with Wound Cleanser prior to applying a clean dressing using gauze sponges, not tissues or cotton balls. Do not scrub or use excessive force. Pat dry using gauze sponges, not tissue or cotton balls. Primary Dressing: Hydrofera Blue Ready Transfer Foam, 2.5x2.5 (in/in) 1 x Per Week/30 Days Discharge Instructions: Apply Hydrofera Blue Ready to wound bed as directed Secondary Dressing: Zetuvit Plus Silicone Non-bordered 5x5 (in/in) 1 x Per Week/30 Days Compression Wrap: Profore Lite LF 3 Multilayer Compression Bandaging System 1 x Per Week/30 Days Discharge Instructions: Apply 3 multi-layer wrap as prescribed. Electronic Signature(s) Signed: 07/05/2021 1:15:18 PM By: Elliot Gurney, BSN, RN, CWS, Kim RN, BSN Signed: 07/05/2021 4:56:27 PM By: Lenda Kelp PA-C Entered By: Elliot Gurney BSN, RN, CWS, Kim on 07/05/2021 13:13:41 [name] ([id number]) -------------------------------------------------------------------------------- Problem List Details Patient Name: AMANUEL, SINKFIELD Date of Service: 07/05/2021 12:30 PM Medical  Record Number: [id number] Patient Account Number: [id number] Date of Birth/Sex: [birth year] (70 y.o. M) Treating RN: Huel Coventry Primary Care Provider: Beverely Low Other Clinician: Referring Provider: Beverely Low Treating Provider/Extender: Rowan Blase in Treatment: 9 Active Problems ICD-10 Encounter Code Description Active Date MDM Diagnosis E11.621 Type 2 diabetes mellitus with foot ulcer 05/03/2021 No Yes L97.512 Non-pressure chronic ulcer of other part of right foot with fat layer 05/03/2021 No Yes exposed L97.522 Non-pressure chronic ulcer of other part of left foot with fat layer 05/03/2021 No Yes exposed Z89.421 Acquired absence of other right toe(s) 05/03/2021 No Yes I10 Essential (primary) hypertension 05/03/2021 No Yes Z79.01 Long term (current) use of anticoagulants 05/03/2021 No Yes Z86.718 Personal history of other venous thrombosis and embolism 05/03/2021 No Yes Inactive Problems Resolved Problems Electronic Signature(s) Signed: 07/05/2021 1:05:06 PM By: Lenda Kelp PA-C Entered By: Lenda Kelp on 07/05/2021 13:05:06 [name] ([id number]) -------------------------------------------------------------------------------- Progress Note Details Patient Name: [name] Date of Service: 07/05/2021 12:30 PM Medical Record Number: [id number] Patient Account Number: [id number] Date of Birth/Sex: 20-May-1951 (70 y.o. 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 Has been seen by [facility] for a number of years it sounds like at least 4 years. With that being said they have tried a split thickness skin graft along with multiple other topical remedies over  the time they have been seeing him over the past several years. Unfortunately they are not able to get this closed I did refer him to [place] for further evaluation and treatment. The patient tells me has been quite sometime since he remembers having an x-ray and again we could not find anything recent when looking through his chart. Fortunately there does not appear to be any signs of active infection systemically which is great news. No fevers, chills, nausea, vomiting, or diarrhea. The patient does have diabetes with his most recent hemoglobin A1c being November 14, 2020 at 9.1. He also does have evidence of lymphedema based on what I am seeing today. 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 05/10/2021 upon evaluation today patient appears to be doing well with regard to his wounds in general. I feel like he is making progress. Fortunately the compression wrap did well for him and overall I am extremely happy with where we stand. No fevers, chills, nausea, vomiting, or diarrhea. 05/17/2021 upon evaluation today patient appears to be doing better in general regard to his wounds. Overall I feel like he is actually signs of good improvement which is great news and I think that we are headed in the right direction. There does not appear to be any signs of active infection at this time. No fevers, chills, nausea, vomiting, or diarrhea. 05/24/2021 upon evaluation today patient appears to be doing well with regard to his wounds. He is actually showing signs of improvement this is great news. Its mainly the right  foot proximal wound that is going to require debridement today and again I do need to try to clear away some of the lip where he had some epiboly I then work on this week by week but if we get that cleared away I think this will heal much more effectively and quickly. 05/31/2021 upon evaluation today patient appears to be doing well at this point in regard to his wounds. Both are showing signs of improvement and very pleased in that regard. Fortunately there does not appear to be any evidence of active infection systemically which is great news. No fevers, chills, nausea, vomiting, or diarrhea. 06/07/2021 upon evaluation today patient appears to be making excellent progress in regard to his foot ulcers. Both are showing signs of excellent improvement. Fortunately there does not appear to be any evidence of active infection which is great news. No fevers, chills, nausea, vomiting, or diarrhea. 06/21/2021 upon evaluation today patient appears to be doing well with regard to his wound. In fact he is making excellent progress and I am extremely pleased with where things stand today. There does not appear to be any signs of active infection at this time. 06/28/2021 upon evaluation patient's foot ulcer actually is showing signs of good improvement. I am actually extremely pleased with how we stand today and I think that he does require some sharp debridement at this point but nothing in comparison what we have had to do before. Overall I think were headed in the appropriate direction he is very pleased to hear this. 07/05/2021 upon evaluation today patient appears to be doing well with regard to his foot ulcer. In fact this is showing signs of excellent improvement which is great news. No fevers, chills, nausea, vomiting, or diarrhea. Objective Constitutional Well-nourished and well-hydrated in no acute distress.  Vitals Time Taken: 1:01 PM, Height: 71 in, Weight: 255 lbs, BMI: 35.6, Temperature: 97.7 F,  Pulse: 98 bpm, Respiratory Rate: 16 breaths/min, Blood Pressure: 127/66 mmHg. JONAVIN, SEDER ([id number]) Respiratory normal breathing without difficulty. Psychiatric this patient is able to make decisions and demonstrates good insight into disease process. Alert and Oriented x 3. pleasant and cooperative. General Notes: Patient's wound does show signs of having quite a bit of drainage which I think is the biggest thing regular actually switch to using a Zetuvit dressing in order to try to catch the excessive drainage which I think will be beneficial for him the patient is in agreement with the plan. We will see patient back for reevaluation in 1 week here in the clinic. If anything worsens or changes patient will contact our office for additional recommendations. Integumentary (Hair, Skin) Wound #1 status is Open. Original cause of wound was Gradually Appeared. The date acquired was: 05/03/2020. The wound has been in treatment 9 weeks. The wound is located on the Right,Proximal,Lateral Foot. The wound measures 1.4cm length x 1.5cm width x 0.2cm depth; 1.649cm^2 area and 0.33cm^3 volume. There is Fat Layer (Subcutaneous Tissue) exposed. There is no tunneling or undermining noted. There is a medium amount of serosanguineous drainage noted. There is medium (34-66%) red granulation within the wound bed. There is a medium (34-66%) amount of necrotic tissue within the wound bed including Adherent Slough. 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 There was a Three Layer Compression Therapy Procedure by Huel Coventry, RN. Post procedure Diagnosis Wound #1: Same as Pre-Procedure Notes: Patient tolerating wraps well.. Plan Follow-up Appointments: Return Appointment in 1 week. Nurse Visit as needed Bathing/ Shower/ Hygiene: May shower with wound dressing protected with water repellent cover or cast protector. No tub bath. Edema Control - Lymphedema / Segmental Compressive Device / Other: Optional: One layer of unna paste to top of compression wrap (to act as an anchor). Elevate legs to the level of the heart and pump ankles as often as possible Elevate leg(s) parallel to the floor when sitting. Compression Pump: Use compression pump on left lower extremity for 60 minutes, twice daily. Compression Pump: Use compression pump on right lower extremity for 60 minutes, twice daily. WOUND #1: - Foot Wound Laterality: Right, Lateral, Proximal Cleanser: Wound Cleanser 1 x Per Week/30 Days Discharge Instructions: Wash your hands with soap and water. Remove old dressing, discard into plastic bag and place into trash. Cleanse the wound with Wound Cleanser prior to applying a clean dressing using gauze sponges, not tissues or cotton balls. Do not scrub or use excessive force. Pat dry using gauze sponges, not tissue or cotton balls. Primary Dressing: Hydrofera Blue Ready Transfer Foam, 2.5x2.5 (in/in) 1 x Per Week/30 Days Discharge Instructions: Apply Hydrofera Blue Ready to wound bed as directed BRAYLN, DUQUE ([id number]) Secondary Dressing: Zetuvit Plus Silicone Non-bordered 5x5 (in/in) 1 x Per Week/30 Days Compression Wrap: Profore Lite LF 3 Multilayer Compression Bandaging System 1 x Per Week/30 Days Discharge Instructions: Apply 3 multi-layer wrap as prescribed. 1. Would recommend  currently that we going to switch to continuing with the [facility] which I think is doing a good job.  However we will also use a Zetuvit dressing to cover. This will catch the excess drainage better I do believe. 2. I am also can recommend that we have the patient continue with the 3 layer compression wrap which I think is doing a great job as well. We will see patient back for reevaluation in 1 week here in the clinic. If anything worsens or changes patient will contact our office for additional recommendations. Electronic Signature(s) Signed: 07/05/2021 1:23:38 PM By: Lenda Kelp PA-C Entered By: Lenda Kelp on 07/05/2021 13:23:38 [name] ([id number]) -------------------------------------------------------------------------------- SuperBill Details Patient Name: [name] Date of Service: 07/05/2021 Medical Record Number: [id number] Patient Account Number: [id number] Date of Birth/Sex: 12/27/1950 (70 y.o. 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 Z89.421 Acquired absence of other right toe(s) I10 Essential (primary) hypertension Z79.01 Long term (current) use of anticoagulants Z86.718 Personal history of other venous thrombosis and embolism Facility Procedures CPT4 Code: 56213086 Description: (Facility Use Only) [id number] - APPLY MULTLAY COMPRS LWR RT LEG Modifier: Quantity: 1 Physician Procedures CPT4 Code: 2952841 Description: 99213 - WC PHYS LEVEL 3 - EST PT Modifier: Quantity: 1 CPT4 Code: Description: ICD-10 Diagnosis Description E11.621 Type 2 diabetes mellitus with foot ulcer L97.512 Non-pressure chronic ulcer of other part of right foot with fat  layer e L97.522 Non-pressure chronic ulcer of other part of left foot with fat layer ex  Z89.421 Acquired absence of other right toe(s) Modifier: xposed posed Quantity: Electronic Signature(s) Signed: 07/05/2021 1:23:54 PM By: Lenda Kelp PA-C Previous Signature: 07/05/2021 1:15:18 PM Version By: Elliot Gurney, BSN, RN, CWS, Kim RN, BSN Entered By: Lenda Kelp on 07/05/2021 13:23:53

## 2021-07-08 ENCOUNTER — Ambulatory Visit (INDEPENDENT_AMBULATORY_CARE_PROVIDER_SITE_OTHER): Payer: Medicare HMO | Admitting: Vascular Surgery

## 2021-07-12 ENCOUNTER — Encounter: Payer: Medicare HMO | Admitting: Physician Assistant

## 2021-07-12 ENCOUNTER — Other Ambulatory Visit: Payer: Self-pay

## 2021-07-12 DIAGNOSIS — L97512 Non-pressure chronic ulcer of other part of right foot with fat layer exposed: Secondary | ICD-10-CM | POA: Diagnosis not present

## 2021-07-12 NOTE — Progress Notes (Addendum)
[name], [name] ([id number]) Visit Report for 07/12/2021 Chief Complaint Document Details Patient Name: [name], [name]. Date of Service: 07/12/2021 2:15 PM Medical Record Number: [id number] Patient Account Number: [id number] Date of Birth/Sex: 12/21/1950 (70 y.o. M) Treating RN: Yevonne Pax Primary Care Provider: Beverely Low Other Clinician: Referring Provider: Beverely Low Treating Provider/Extender: Rowan Blase in Treatment: 10 Information Obtained from: Patient Chief Complaint Bilateral foot ulcers Electronic Signature(s) Signed: 07/12/2021 1:57:26 PM By: Lenda Kelp PA-C Entered By: Lenda Kelp on 07/12/2021 13:57:25 [name] ([id number]) -------------------------------------------------------------------------------- HPI Details Patient Name: [name] Date of Service: 07/12/2021 2:15 PM Medical Record Number: [id number] Patient Account Number: [id number] Date of Birth/Sex: [birth year] (70 y.o. 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 Has been seen by [facility]iatry [facility] for a number of years it sounds like at least 4 years. With that being said they have tried a split thickness skin graft along with multiple other topical remedies over the time they have been seeing him over the past several years. Unfortunately they are not able to get this closed I did refer him to [place] for further evaluation and treatment. The patient tells me has been quite sometime since he  remembers having an x-ray and again we could not find anything recent when looking through his chart. Fortunately there does not appear to be any signs of active infection systemically which is great news. No fevers, chills, nausea, vomiting, or diarrhea. The patient does have diabetes with his most recent hemoglobin A1c being November 14, 2020 at 9.1. He also does have evidence of lymphedema based on what I am seeing today. 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 05/10/2021 upon evaluation today patient appears to be doing well with regard to his wounds in general. I feel like he is making progress. Fortunately the compression wrap did well for him and overall I am extremely happy with where we stand. No fevers, chills, nausea, vomiting, or diarrhea. 05/17/2021 upon evaluation today patient appears to be doing better in general regard to his wounds. Overall I feel like he is actually signs of good improvement which is great news and I think that we are headed in the right direction. There does not appear to be any signs of active infection at this time. No fevers, chills, nausea, vomiting, or diarrhea. 05/24/2021 upon evaluation today patient appears to be doing well with regard to his wounds. He is actually showing signs of improvement this is great news. Its mainly the right foot proximal wound that is going to require debridement today and again I do need to try to clear away some of the lip where he had some epiboly I then work on this week by week but if we get that cleared away I think this will heal much more effectively and quickly. 05/31/2021 upon evaluation today patient appears to be doing well at this point in regard to  his wounds. Both are showing signs of improvement and very pleased in that regard. Fortunately there does not appear to  be any evidence of active infection systemically which is great news. No fevers, chills, nausea, vomiting, or diarrhea. 06/07/2021 upon evaluation today patient appears to be making excellent progress in regard to his foot ulcers. Both are showing signs of excellent improvement. Fortunately there does not appear to be any evidence of active infection which is great news. No fevers, chills, nausea, vomiting, or diarrhea. 06/21/2021 upon evaluation today patient appears to be doing well with regard to his wound. In fact he is making excellent progress and I am extremely pleased with where things stand today. There does not appear to be any signs of active infection at this time. 06/28/2021 upon evaluation patient's foot ulcer actually is showing signs of good improvement. I am actually extremely pleased with how we stand today and I think that he does require some sharp debridement at this point but nothing in comparison what we have had to do before. Overall I think were headed in the appropriate direction he is very pleased to hear this. 07/05/2021 upon evaluation today patient appears to be doing well with regard to his foot ulcer. In fact this is showing signs of excellent improvement which is great news. No fevers, chills, nausea, vomiting, or diarrhea. 07/12/2021 upon evaluation today patient appears to be doing well with regard to his wound. He has been tolerating the dressing changes without complication. Fortunately this is showing signs of excellent improvement which is great news. No fevers, chills, nausea, vomiting, or diarrhea. Electronic Signature(s) Signed: 07/12/2021 2:45:55 PM By: Lenda Kelp PA-C Entered By: Lenda Kelp on 07/12/2021 14:45:54 [name] ([id number]) -------------------------------------------------------------------------------- Physical Exam Details Patient Name: [name], [name] Date of Service: 07/12/2021 2:15 PM Medical Record Number:  [id number] Patient Account Number: [id number] Date of Birth/Sex: April 29, 1951 (70 y.o. M) Treating RN: Yevonne Pax Primary Care Provider: Beverely Low Other Clinician: Referring Provider: Beverely Low Treating Provider/Extender: Rowan Blase in Treatment: 10 Constitutional Well-nourished and well-hydrated in no acute distress. Respiratory normal breathing without difficulty. Psychiatric this patient is able to make decisions and demonstrates good insight into disease process. Alert and Oriented x 3. pleasant and cooperative. Notes Patient's wound bed showed signs of good granulation epithelization at this point. I do not see any evidence of infection which is great and overall I am extremely pleased with where we stand today. No fevers, chills, nausea, vomiting, or diarrhea. Electronic Signature(s) Signed: 07/12/2021 2:47:35 PM By: Lenda Kelp PA-C Previous Signature: 07/12/2021 2:46:45 PM Version By: Lenda Kelp PA-C Entered By: Lenda Kelp on 07/12/2021 14:47:35 [name] ([id number]) -------------------------------------------------------------------------------- Physician Orders Details Patient Name: [name] Date of Service: 07/12/2021 2:15 PM Medical Record Number: [id number] Patient Account Number: [id number] Date of Birth/Sex: Apr 24, 1951 (70 y.o. 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 o  Nurse Visit as needed Bathing/ Shower/ Hygiene o May shower with wound dressing protected with water repellent cover or cast protector. o No tub bath. Edema Control - Lymphedema / Segmental Compressive Device / Other Right Lower Extremity o Optional: One layer of unna paste to top of compression wrap (to act as an anchor). o Elevate legs to the level of the heart and pump ankles as often as possible o Elevate leg(s) parallel to the floor when sitting. o Compression Pump: Use compression pump on left lower extremity for 60 minutes, twice daily. o Compression Pump: Use compression pump on right lower extremity for 60 minutes, twice daily. Wound Treatment Wound #1 - Foot Wound Laterality: Right, Lateral, Proximal Cleanser: Wound Cleanser 1 x Per Week/30 Days Discharge Instructions: Wash your hands with soap and water. Remove old dressing, discard into plastic bag and place into trash. Cleanse the wound with Wound Cleanser prior to applying a clean dressing using gauze sponges, not tissues or cotton balls. Do not scrub or use excessive force. Pat dry using gauze sponges, not tissue or cotton balls. Primary Dressing: Hydrofera Blue Ready Transfer Foam, 2.5x2.5 (in/in) 1 x Per Week/30 Days Discharge Instructions: Apply Hydrofera Blue Ready to wound bed as directed Secondary Dressing: Zetuvit Plus Silicone Non-bordered 5x5 (in/in) 1 x Per Week/30 Days Compression Wrap: Profore Lite LF 3 Multilayer Compression Bandaging System 1 x Per Week/30 Days Discharge Instructions: Apply 3 multi-layer wrap as prescribed. Electronic Signature(s) Signed: 07/12/2021 3:56:47 PM By: Yevonne Pax RN Signed: 07/12/2021 4:22:43 PM By: Lenda Kelp PA-C Entered By: Yevonne Pax on 07/12/2021 14:41:30 [name]  ([id number]) -------------------------------------------------------------------------------- Problem List Details Patient Name: [name], [name] Date of Service: 07/12/2021 2:15 PM Medical Record Number: [id number] Patient Account Number: [id number] Date of Birth/Sex: 1951/05/04 (70 y.o. M) Treating RN: Yevonne Pax Primary Care Provider: Beverely Low Other Clinician: Referring Provider: Beverely Low Treating Provider/Extender: Rowan Blase in Treatment: 10 Active Problems ICD-10 Encounter Code Description Active Date MDM Diagnosis E11.621 Type 2 diabetes mellitus with foot ulcer 05/03/2021 No Yes L97.512 Non-pressure chronic ulcer of other part of right foot with fat layer 05/03/2021 No Yes exposed L97.522 Non-pressure chronic ulcer of other part of left foot with fat layer 05/03/2021 No Yes exposed Z89.421 Acquired absence of other right toe(s) 05/03/2021 No Yes I10 Essential (primary) hypertension 05/03/2021 No Yes Z79.01 Long term (current) use of anticoagulants 05/03/2021 No Yes Z86.718 Personal history of other venous thrombosis and embolism 05/03/2021 No Yes Inactive Problems Resolved Problems Electronic Signature(s) Signed: 07/12/2021 1:57:16 PM By: Lenda Kelp PA-C Entered By: Lenda Kelp on 07/12/2021 13:57:16 [name] ([id number]) -------------------------------------------------------------------------------- Progress Note Details Patient Name: [name] Date of Service: 07/12/2021 2:15 PM Medical Record Number: [id number] Patient Account Number: [id number] Date of Birth/Sex: March 25, 1951 (70 y.o. 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 Has been seen by [facility]ry [facility] for a number of years it sounds like at least 4 years. With that being said they have tried a split thickness  skin graft along with multiple other topical remedies over the time they have been seeing him over the past several years. Unfortunately they are not able to get this closed I did refer him to [place] for further evaluation and treatment. The patient tells me has been quite sometime since he remembers having an x-ray and again we could not find anything recent when looking through his chart. Fortunately there does not appear to be any signs of active infection systemically which is great news. No fevers, chills, nausea, vomiting, or diarrhea. The patient does have diabetes with his most recent hemoglobin A1c being November 14, 2020 at 9.1. He also does have evidence of lymphedema based on what I am seeing today. 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 05/10/2021 upon evaluation today patient appears to be doing well with regard to his wounds in general. I feel like he is making progress. Fortunately the compression wrap did well for him and overall I am extremely happy with where we stand. No fevers, chills, nausea, vomiting, or diarrhea. 05/17/2021 upon evaluation today patient appears to be doing better in general regard to his wounds. Overall I feel like he is actually signs of good improvement which is great news and I think that we are headed in the right direction. There does not appear to be any signs of active infection at this time. No fevers, chills, nausea,  vomiting, or diarrhea. 05/24/2021 upon evaluation today patient appears to be doing well with regard to his wounds. He is actually showing signs of improvement this is great news. Its mainly the right foot proximal wound that is going to require debridement today and again I do need to try to clear away some of the lip where he had some epiboly I then work on this week by week but if we get that cleared away I think this will heal much more effectively and quickly. 05/31/2021 upon evaluation today patient appears to be doing well at this point in regard to his wounds. Both are showing signs of improvement and very pleased in that regard. Fortunately there does not appear to be any evidence of active infection systemically which is great news. No fevers, chills, nausea, vomiting, or diarrhea. 06/07/2021 upon evaluation today patient appears to be making excellent progress in regard to his foot ulcers. Both are showing signs of excellent improvement. Fortunately there does not appear to be any evidence of active infection which is great news. No fevers, chills, nausea, vomiting, or diarrhea. 06/21/2021 upon evaluation today patient appears to be doing well with regard to his wound. In fact he is making excellent progress and I am extremely pleased with where things stand today. There does not appear to be any signs of active infection at this time. 06/28/2021 upon evaluation patient's foot ulcer actually is showing signs of good improvement. I am actually extremely pleased with how we stand today and I think that he does require some sharp debridement at this point but nothing in comparison what we have had to do before. Overall I think were headed in the appropriate direction he is very pleased to hear this. 07/05/2021 upon evaluation today patient appears to be doing well with regard to his foot ulcer. In fact this is showing signs of excellent improvement which is great news. No fevers, chills, nausea,  vomiting, or  diarrhea. 07/12/2021 upon evaluation today patient appears to be doing well with regard to his wound. He has been tolerating the dressing changes without complication. Fortunately this is showing signs of excellent improvement which is great news. No fevers, chills, nausea, vomiting, or diarrhea. Objective Constitutional Well-nourished and well-hydrated in no acute distress. [name], [name] ([id number]) Vitals Time Taken: 2:27 PM, Height: 71 in, Weight: 255 lbs, BMI: 35.6, Temperature: 98.8 F, Pulse: 76 bpm, Respiratory Rate: 18 breaths/min, Blood Pressure: 131/70 mmHg. Respiratory normal breathing without difficulty. Psychiatric this patient is able to make decisions and demonstrates good insight into disease process. Alert and Oriented x 3. pleasant and cooperative. General Notes: Patient's wound bed showed signs of good granulation epithelization at this point. I do not see any evidence of infection which is great and overall I am extremely pleased with where we stand today. No fevers, chills, nausea, vomiting, or diarrhea. Integumentary (Hair, Skin) Wound #1 status is Open. Original cause of wound was Gradually Appeared. The date acquired was: 05/03/2020. The wound has been in treatment 10 weeks. The wound is located on the Right,Proximal,Lateral Foot. The wound measures 0.3cm length x 0.3cm width x 0.1cm depth; 0.071cm^2 area and 0.007cm^3 volume. There is Fat Layer (Subcutaneous Tissue) exposed. There is no tunneling or undermining noted. There is a medium amount of serosanguineous drainage noted. There is medium (34-66%) red granulation within the wound bed. There is a medium (34-66%) amount of necrotic tissue within the wound bed including Adherent Slough. 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 Nurse Visit as needed Bathing/ Shower/ Hygiene: May shower with wound dressing protected with water repellent cover or cast protector. No tub bath. Edema Control - Lymphedema / Segmental Compressive Device / Other: Optional: One layer of unna paste to top of compression wrap (to act as an anchor). Elevate legs to the level of the heart and pump ankles as often as possible Elevate leg(s) parallel to the floor when sitting. Compression Pump: Use compression pump on left lower extremity for 60 minutes, twice daily. Compression Pump: Use compression pump on right lower extremity for 60 minutes, twice daily. WOUND #1: - Foot Wound Laterality: Right, Lateral, Proximal Cleanser: Wound Cleanser 1 x Per Week/30 Days Discharge Instructions: Wash your hands with soap and water. Remove old dressing, discard into plastic bag and place into trash. Cleanse the wound with Wound Cleanser prior to applying a clean dressing using gauze sponges, not tissues or cotton balls. Do not scrub or use excessive force. Pat dry using gauze sponges, not tissue or cotton balls. Primary Dressing: Hydrofera Blue Ready Transfer Foam, 2.5x2.5 (in/in) 1 x Per Week/30 Days Discharge Instructions: Apply Hydrofera Blue Ready to wound bed as directed Secondary Dressing: Zetuvit Plus Silicone Non-bordered 5x5 (in/in) 1 x Per Week/30 Days Compression Wrap: Profore Lite LF 3 Multilayer Compression Bandaging System 1 x Per Week/30 Days Discharge Instructions: Apply 3 multi-layer wrap as prescribed. 1. Would recommend that we continue with [facility] as I feel like this is doing quite well for him and the patient is in agreement with that plan. 2. I am also can recommend that we have  the  patient continue to Monitor for any signs of worsening the right now I really feel like he is doing quite well and I am very pleased with the progress has been made up to this point. [name], [name] ([id number]) We will see patient back for reevaluation in 1 week here in the clinic. If anything worsens or changes patient will contact our office for additional recommendations. Electronic Signature(s) Signed: 07/12/2021 2:47:51 PM By: Lenda Kelp PA-C Previous Signature: 07/12/2021 2:47:18 PM Version By: Lenda Kelp PA-C Entered By: Lenda Kelp on 07/12/2021 14:47:51 [name] ([id number]) -------------------------------------------------------------------------------- SuperBill Details Patient Name: [name] Date of Service: 07/12/2021 Medical Record Number: [id number] Patient Account Number: [id number] Date of Birth/Sex: [birth year] (70 y.o. 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.522 Non-pressure chronic ulcer of other part of left foot with fat layer exposed Z89.421 Acquired absence of other right toe(s) I10 Essential (primary) hypertension Z79.01 Long term (current) use of anticoagulants Z86.718 Personal history of other venous thrombosis and embolism Facility Procedures CPT4 Code: 95638756 Description: (Facility Use Only) [id number] - APPLY MULTLAY COMPRS LWR RT LEG Modifier: Quantity: 1 Physician Procedures CPT4 Code: 8841660 Description: 99213 - WC PHYS LEVEL 3 - EST PT Modifier: Quantity: 1 CPT4 Code: Description: ICD-10 Diagnosis Description E11.621 Type 2 diabetes mellitus with foot ulcer L97.512 Non-pressure  chronic ulcer of other part of right foot with fat layer e L97.522 Non-pressure chronic ulcer of other part of left foot with fat layer ex  Z89.421 Acquired absence of other right toe(s) Modifier: xposed posed Quantity: Electronic Signature(s) Signed: 07/12/2021 3:56:47 PM By: Yevonne Pax RN Signed: 07/12/2021 4:22:43 PM By: Lenda Kelp PA-C Previous Signature: 07/12/2021 2:48:13 PM Version By: Lenda Kelp PA-C Entered By: Yevonne Pax on 07/12/2021 14:48:42

## 2021-07-12 NOTE — Progress Notes (Addendum)
EUGINE, GODMAN ([id number]) Visit Report for 07/12/2021 Arrival Information Details Patient Name: [name], [name] Date of Service: 07/12/2021 2:15 PM Medical Record Number: [id number] Patient Account Number: [id number] Date of Birth/Sex: 11-04-1950 (70 y.o. M) Treating RN: Carlene Coria Primary Care Rossi Silvestro: Beverlyn Roux Other Clinician: Referring Jamilia Jacques: Beverlyn Roux Treating Jyaire Koudelka/Extender: Skipper Cliche in Treatment: 10 Visit Information History Since Last Visit All ordered tests and consults were completed: No Patient Arrived: [name] Added or deleted any medications: No Arrival Time: 14:24 Any new allergies or adverse reactions: No Accompanied By: self Had a fall or experienced change in No Transfer Assistance: None activities of daily living that may affect Patient Identification Verified: Yes risk of falls: Secondary Verification Process Completed: Yes Signs or symptoms of abuse/neglect since last visito No Patient Has Alerts: Yes Hospitalized since last visit: No Patient Alerts: Patient on Blood Thinner Implantable device outside of the clinic excluding No Type II Diabetic cellular tissue based products placed in the center 81mg  aspirin since last visit: Has Dressing in Place as Prescribed: Yes Has Compression in Place as Prescribed: Yes Pain Present Now: No Electronic Signature(s) Signed: 07/12/2021 3:56:47 PM By: Carlene Coria RN Entered By: Carlene Coria on 07/12/2021 14:27:54 [name] ([id number]) -------------------------------------------------------------------------------- Clinic Level of Care Assessment Details Patient Name: [name] Date of Service: 07/12/2021 2:15 PM Medical Record Number: [id number] Patient Account Number: [id number] Date of Birth/Sex: 1950-11-29 (70 y.o. M) Treating RN: Carlene Coria Primary Care Oma Marzan: Beverlyn Roux Other Clinician: Referring Adeel Guiffre: Beverlyn Roux Treating Shonika Kolasinski/Extender:  Skipper Cliche in Treatment: 10 Clinic Level of Care Assessment Items TOOL 1 Quantity Score []  - Use when EandM and Procedure is performed on INITIAL visit 0 ASSESSMENTS - Nursing Assessment / Reassessment []  - General Physical Exam (combine w/ comprehensive assessment (listed just below) when performed on new 0 pt. evals) []  - 0 Comprehensive Assessment (HX, ROS, Risk Assessments, Wounds Hx, etc.) ASSESSMENTS - Wound and Skin Assessment / Reassessment []  - Dermatologic / Skin Assessment (not related to wound area) 0 ASSESSMENTS - Ostomy and/or Continence Assessment and Care []  - Incontinence Assessment and Management 0 []  - 0 Ostomy Care Assessment and Management (repouching, etc.) PROCESS - Coordination of Care []  - Simple Patient / Family Education for ongoing care 0 []  - 0 Complex (extensive) Patient / Family Education for ongoing care []  - 0 Staff obtains Programmer, systems, Records, Test Results / Process Orders []  - 0 Staff telephones HHA, Nursing Homes / Clarify orders / etc []  - 0 Routine Transfer to another Facility (non-emergent condition) []  - 0 Routine Hospital Admission (non-emergent condition) []  - 0 New Admissions / Biomedical engineer / Ordering NPWT, Apligraf, etc. []  - 0 Emergency Hospital Admission (emergent condition) PROCESS - Special Needs []  - Pediatric / Minor Patient Management 0 []  - 0 Isolation Patient Management []  - 0 Hearing / Language / Visual special needs []  - 0 Assessment of Community assistance (transportation, D/C planning, etc.) []  - 0 Additional assistance / Altered mentation []  - 0 Support Surface(s) Assessment (bed, cushion, seat, etc.) INTERVENTIONS - Miscellaneous []  - External ear exam 0 []  - 0 Patient Transfer (multiple staff / Civil Service fast streamer / Similar devices) []  - 0 Simple Staple / Suture removal (25 or less) []  - 0 Complex Staple / Suture removal (26 or more) []  - 0 Hypo/Hyperglycemic Management (do not check if billed  separately) []  - 0 Ankle / Brachial Index (ABI) - do not check if billed separately Has the patient been seen  at the hospital within the last three years: Yes Total Score: 0 Level Of Care: ____ [name] ([id number]) Electronic Signature(s) Signed: 07/12/2021 3:56:47 PM By: Carlene Coria RN Entered By: Carlene Coria on 07/12/2021 14:48:27 [name] ([id number]) -------------------------------------------------------------------------------- Encounter Discharge Information Details Patient Name: [name], [name] Date of Service: 07/12/2021 2:15 PM Medical Record Number: [id number] Patient Account Number: [id number] Date of Birth/Sex: 1950/10/31 (70 y.o. M) Treating RN: Carlene Coria Primary Care Luceil Herrin: Beverlyn Roux Other Clinician: Referring Alvon Nygaard: Beverlyn Roux Treating Sriansh Farra/Extender: Skipper Cliche in Treatment: 10 Encounter Discharge Information Items Discharge Condition: Stable Ambulatory Status: Walker Discharge Destination: Home Transportation: Private Auto Accompanied By: self Schedule Follow-up Appointment: Yes Clinical Summary of Care: Patient Declined Electronic Signature(s) Signed: 07/12/2021 3:56:47 PM By: Carlene Coria RN Entered By: Carlene Coria on 07/12/2021 14:49:32 [name] ([id number]) -------------------------------------------------------------------------------- Lower Extremity Assessment Details Patient Name: [name] Date of Service: 07/12/2021 2:15 PM Medical Record Number: [id number] Patient Account Number: [id number] Date of Birth/Sex: 12-29-1950 (70 y.o. M) Treating RN: Carlene Coria Primary Care Nishtha Raider: Beverlyn Roux Other Clinician: Referring Sherise Geerdes: Beverlyn Roux Treating Nolah Krenzer/Extender: Skipper Cliche in Treatment: 10 Edema Assessment Assessed: [Left: No] [Right: No] Edema: [Left: Ye] [Right: s] Calf Left: Right: Point of Measurement: 31 cm From Medial Instep 42.5 cm Ankle Left:  Right: Point of Measurement: 10 cm From Medial Instep 27 cm Vascular Assessment Pulses: Dorsalis Pedis Palpable: [Right:Yes] Electronic Signature(s) Signed: 07/12/2021 3:56:47 PM By: Carlene Coria RN Entered By: Carlene Coria on 07/12/2021 14:35:48 [name] ([id number]) -------------------------------------------------------------------------------- Multi Wound Chart Details Patient Name: [name] Date of Service: 07/12/2021 2:15 PM Medical Record Number: [id number] Patient Account Number: [id number] Date of Birth/Sex: [birth year] (70 y.o. M) Treating RN: Carlene Coria Primary Care Haylee Mcanany: Beverlyn Roux Other Clinician: Referring Myshawn Chiriboga: Beverlyn Roux Treating Jasreet Dickie/Extender: Skipper Cliche in Treatment: 10 Vital Signs Height(in): 71 Pulse(bpm): 60 Weight(lbs): 255 Blood Pressure(mmHg): 131/70 Body Mass Index(BMI): 36 Temperature(F): 98.8 Respiratory Rate(breaths/min): 18 Photos: [N/A:N/A] Wound Location: Right, Proximal, Lateral Foot N/A N/A Wounding Event: Gradually Appeared N/A N/A Primary Etiology: Diabetic Wound/Ulcer of the Lower N/A N/A Extremity Comorbid History: Cataracts, Lymphedema, Deep Vein N/A N/A Thrombosis, Hypertension, Type II Diabetes, Osteoarthritis, Osteomyelitis Date Acquired: 05/03/2020 N/A N/A Weeks of Treatment: 10 N/A N/A Wound Status: Open N/A N/A Measurements L x W x D (cm) 0.3x0.3x0.1 N/A N/A Area (cm) : 0.071 N/A N/A Volume (cm) : 0.007 N/A N/A % Reduction in Area: 97.60% N/A N/A % Reduction in Volume: 99.60% N/A N/A Classification: Grade 2 N/A N/A Exudate Amount: Medium N/A N/A Exudate Type: Serosanguineous N/A N/A Exudate Color: red, brown N/A N/A Granulation Amount: Medium (34-66%) N/A N/A Granulation Quality: Red N/A N/A Necrotic Amount: Medium (34-66%) N/A N/A Exposed Structures: Fat Layer (Subcutaneous Tissue): N/A N/A Yes Fascia: No Tendon: No Muscle: No Joint: No Bone: No Epithelialization:  None N/A N/A Treatment Notes Electronic Signature(s) Signed: 07/12/2021 3:56:47 PM By: Carlene Coria RN Entered By: Carlene Coria on 07/12/2021 14:40:57 [name] ([id number]) -------------------------------------------------------------------------------- Multi-Disciplinary Care Plan Details Patient Name: [name] Date of Service: 07/12/2021 2:15 PM Medical Record Number: [id number] Patient Account Number: [id number] Date of Birth/Sex: 03-06-1951 (70 y.o. M) Treating RN: Carlene Coria Primary Care Ayshia Gramlich: Beverlyn Roux Other Clinician: Referring Lillah Standre: Beverlyn Roux Treating Elisabella Hacker/Extender: Skipper Cliche in Treatment: 10 Active Inactive Abuse / Safety / Falls / Self Care Management Nursing Diagnoses: Potential for falls Potential for injury related to falls Goals: Patient will not experience any  injury related to falls Date Initiated: 05/03/2021 Target Resolution Date: 06/02/2021 Goal Status: Active Patient will remain injury free related to falls Date Initiated: 05/03/2021 Target Resolution Date: 06/02/2021 Goal Status: Active Patient/caregiver will verbalize understanding of skin care regimen Date Initiated: 05/03/2021 Target Resolution Date: 05/03/2021 Goal Status: Active Interventions: Call light and/or bell within patient's reach Podiatry chair, stretcher in low position and side rails up as needed Assess Activities of Daily Living upon admission and as needed Assess fall risk on admission and as needed Assess: immobility, friction, shearing, incontinence upon admission and as needed Assess impairment of mobility on admission and as needed per policy Notes: Necrotic Tissue Nursing Diagnoses: Impaired tissue integrity related to necrotic/devitalized tissue Knowledge deficit related to management of necrotic/devitalized tissue Goals: Necrotic/devitalized tissue will be minimized in the wound bed Date Initiated: 05/03/2021 Target Resolution Date:  05/03/2021 Goal Status: Active Patient/caregiver will verbalize understanding of reason and process for debridement of necrotic tissue Date Initiated: 05/03/2021 Target Resolution Date: 05/03/2021 Goal Status: Active Interventions: Assess patient pain level pre-, during and post procedure and prior to discharge Provide education on necrotic tissue and debridement process Treatment Activities: Apply topical anesthetic as ordered : 05/03/2021 Biologic debridement : 05/03/2021 Enzymatic debridement : 05/03/2021 Excisional debridement : 05/03/2021 Notes: KATAI, MARSICO ([id number]) Wound/Skin Impairment Nursing Diagnoses: Impaired tissue integrity Goals: Patient/caregiver will verbalize understanding of skin care regimen Date Initiated: 05/03/2021 Target Resolution Date: 05/03/2021 Goal Status: Active Ulcer/skin breakdown will have a volume reduction of 30% by week 4 Date Initiated: 05/03/2021 Target Resolution Date: 06/02/2021 Goal Status: Active Ulcer/skin breakdown will have a volume reduction of 50% by week 8 Date Initiated: 05/03/2021 Target Resolution Date: 07/03/2021 Goal Status: Active Ulcer/skin breakdown will have a volume reduction of 80% by week 12 Date Initiated: 05/03/2021 Target Resolution Date: 08/02/2021 Goal Status: Active Ulcer/skin breakdown will heal within 14 weeks Date Initiated: 05/03/2021 Target Resolution Date: 09/02/2021 Goal Status: Active Interventions: Assess patient/caregiver ability to obtain necessary supplies Assess patient/caregiver ability to perform ulcer/skin care regimen upon admission and as needed Assess ulceration(s) every visit Provide education on ulcer and skin care Treatment Activities: Referred to DME Nguyet Mercer for dressing supplies : 05/03/2021 Skin care regimen initiated : 05/03/2021 Notes: Electronic Signature(s) Signed: 07/12/2021 3:56:47 PM By: Yevonne Pax RN Entered By: Yevonne Pax on 07/12/2021 14:40:47 [name]  ([id number]) -------------------------------------------------------------------------------- Pain Assessment Details Patient Name: [name] Date of Service: 07/12/2021 2:15 PM Medical Record Number: [id number] Patient Account Number: [id number] Date of Birth/Sex: 1950/12/08 (70 y.o. M) Treating RN: Yevonne Pax Primary Care Kendon Sedeno: Beverely Low Other Clinician: Referring Phyllis Whitefield: Beverely Low Treating Karter Hellmer/Extender: Rowan Blase in Treatment: 10 Active Problems Location of Pain Severity and Description of Pain Patient Has Paino No Site Locations Pain Management and Medication Current Pain Management: Electronic Signature(s) Signed: 07/12/2021 3:56:47 PM By: Yevonne Pax RN Entered By: Yevonne Pax on 07/12/2021 14:28:21 [name] ([id number]) -------------------------------------------------------------------------------- Patient/Caregiver Education Details Patient Name: [name] Date of Service: 07/12/2021 2:15 PM Medical Record Number: [id number] Patient Account Number: [id number] Date of Birth/Gender: [birth year] (70 y.o. M) Treating RN: Yevonne Pax Primary Care Physician: Beverely Low Other Clinician: Referring Physician: Beverely Low Treating Physician/Extender: Rowan Blase in Treatment: 10 Education Assessment Education Provided To: Patient Education Topics Provided Wound/Skin Impairment: Methods: Explain/Verbal Responses: State content correctly Electronic Signature(s) Signed: 07/12/2021 3:56:47 PM By: Yevonne Pax RN Entered By: Yevonne Pax on 07/12/2021 14:48:54 [name] ([id number]) -------------------------------------------------------------------------------- Wound Assessment Details Patient Name: [name] Date  of Service: 07/12/2021 2:15 PM Medical Record Number: [id number] Patient Account Number: [id number] Date of Birth/Sex: [birth year] (70 y.o. M) Treating RN: Carlene Coria Primary  Care Kambrie Eddleman: Beverlyn Roux Other Clinician: Referring Larcenia Holaday: Beverlyn Roux Treating Vayda Dungee/Extender: Skipper Cliche in Treatment: 10 Wound Status Wound Number: 1 Primary Diabetic Wound/Ulcer of the Lower Extremity Etiology: Wound Location: Right, Proximal, Lateral Foot Wound Open Wounding Event: Gradually Appeared Status: Date Acquired: 05/03/2020 Comorbid Cataracts, Lymphedema, Deep Vein Thrombosis, Weeks Of Treatment: 10 History: Hypertension, Type II Diabetes, Osteoarthritis, Clustered Wound: No Osteomyelitis Photos Wound Measurements Length: (cm) 0.3 Width: (cm) 0.3 Depth: (cm) 0.1 Area: (cm) 0.071 Volume: (cm) 0.007 % Reduction in Area: 97.6% % Reduction in Volume: 99.6% Epithelialization: None Tunneling: No Undermining: No Wound Description Classification: Grade 2 Exudate Amount: Medium Exudate Type: Serosanguineous Exudate Color: red, brown Foul Odor After Cleansing: No Slough/Fibrino No Wound Bed Granulation Amount: Medium (34-66%) Exposed Structure Granulation Quality: Red Fascia Exposed: No Necrotic Amount: Medium (34-66%) Fat Layer (Subcutaneous Tissue) Exposed: Yes Necrotic Quality: Adherent Slough Tendon Exposed: No Muscle Exposed: No Joint Exposed: No Bone Exposed: No Treatment Notes Wound #1 (Foot) Wound Laterality: Right, Lateral, Proximal Cleanser Wound Cleanser Discharge Instruction: Wash your hands with soap and water. Remove old dressing, discard into plastic bag and place into trash. Cleanse the wound with Wound Cleanser prior to applying a clean dressing using gauze sponges, not tissues or cotton balls. Do not scrub or use excessive force. Pat dry using gauze sponges, not tissue or cotton balls. DARVIS, CARTON ([id number]) Peri-Wound Care Topical Primary Dressing Hydrofera Blue Ready Transfer Foam, 2.5x2.5 (in/in) Discharge Instruction: Apply Hydrofera Blue Ready to wound bed as directed Secondary Dressing Zetuvit Plus  Silicone Non-bordered 5x5 (in/in) Secured With Compression Wrap Profore Lite LF 3 Multilayer Compression Bandaging System Discharge Instruction: Apply 3 multi-layer wrap as prescribed. Compression Stockings Add-Ons Electronic Signature(s) Signed: 07/12/2021 3:56:47 PM By: Carlene Coria RN Entered By: Carlene Coria on 07/12/2021 14:34:56 [name] ([id number]) -------------------------------------------------------------------------------- Vitals Details Patient Name: [name] Date of Service: 07/12/2021 2:15 PM Medical Record Number: [id number] Patient Account Number: [id number] Date of Birth/Sex: 1951/02/26 (70 y.o. M) Treating RN: Carlene Coria Primary Care Bernard Slayden: Beverlyn Roux Other Clinician: Referring Orey Moure: Beverlyn Roux Treating Salathiel Ferrara/Extender: Skipper Cliche in Treatment: 10 Vital Signs Time Taken: 14:27 Temperature (F): 98.8 Height (in): 71 Pulse (bpm): 76 Weight (lbs): 255 Respiratory Rate (breaths/min): 18 Body Mass Index (BMI): 35.6 Blood Pressure (mmHg): 131/70 Reference Range: 80 - 120 mg / dl Electronic Signature(s) Signed: 07/12/2021 3:56:47 PM By: Carlene Coria RN Entered By: Carlene Coria on 07/12/2021 14:28:14

## 2021-07-15 ENCOUNTER — Ambulatory Visit (INDEPENDENT_AMBULATORY_CARE_PROVIDER_SITE_OTHER): Payer: Medicare HMO | Admitting: Vascular Surgery

## 2021-07-15 ENCOUNTER — Encounter (INDEPENDENT_AMBULATORY_CARE_PROVIDER_SITE_OTHER): Payer: Self-pay | Admitting: Vascular Surgery

## 2021-07-15 ENCOUNTER — Other Ambulatory Visit: Payer: Self-pay

## 2021-07-15 VITALS — BP 136/66 | HR 98 | Ht 71.0 in | Wt 274.0 lb

## 2021-07-15 DIAGNOSIS — L97909 Non-pressure chronic ulcer of unspecified part of unspecified lower leg with unspecified severity: Secondary | ICD-10-CM

## 2021-07-15 DIAGNOSIS — I83009 Varicose veins of unspecified lower extremity with ulcer of unspecified site: Secondary | ICD-10-CM

## 2021-07-15 DIAGNOSIS — I890 Lymphedema, not elsewhere classified: Secondary | ICD-10-CM

## 2021-07-15 DIAGNOSIS — I10 Essential (primary) hypertension: Secondary | ICD-10-CM

## 2021-07-15 DIAGNOSIS — Z794 Long term (current) use of insulin: Secondary | ICD-10-CM

## 2021-07-15 DIAGNOSIS — E1152 Type 2 diabetes mellitus with diabetic peripheral angiopathy with gangrene: Secondary | ICD-10-CM | POA: Diagnosis not present

## 2021-07-15 DIAGNOSIS — I872 Venous insufficiency (chronic) (peripheral): Secondary | ICD-10-CM

## 2021-07-15 NOTE — Progress Notes (Signed)
MRN : [id number]  [name] is a 70 y.o. (1950-12-11) male who presents with chief complaint of check leg swelling.  History of Present Illness:   The patient returns to the office for followup evaluation regarding leg swelling.  The swelling has persisted but with the lymph pump is much, much better controlled. The pain associated with swelling is essentially eliminated. There have not been any interval development of a ulcerations or wounds.  He states Dr Larina BrasStone is very please with how much better his leg wound looks.  The patient denies problems with the pump, noting it is working well and the leggings are in good condition.  Since the previous visit the patient has been wearing graduated compression stockings and using the lymph pump on a routine basis and  has noted significant improvement in the lymphedema.   Patient stated the lymph pump has been a very positive factor in her care.    Current Meds  Medication Sig   ACCU-CHEK GUIDE test strip Use 1 strip via meter four times a day  to monitor blood glucose   Accu-Chek Softclix Lancets lancets    acetaminophen (TYLENOL) 325 MG tablet Take 650 mg by mouth every 6 (six) hours as needed.   atorvastatin (LIPITOR) 10 MG tablet TAKE 1 TABLET  EACH  EVENING FOR HIGH CHOLESTEROL   atorvastatin (LIPITOR) 20 MG tablet Take 20 mg by mouth daily.   bacitracin ointment Apply in a thin layer to the wound of the right foot and cover with gauze and a wrap  Do this daily (once per day)   Calcium Alginate (RESTORE CALCICARE DRESSING) MISC APPLY TO WOUNDS THEN COVER WITH FOAM DRESSING EVERY OTHER DAY   docusate sodium (COLACE) 100 MG capsule Take 1 capsule (100 mg total) by mouth 2 (two) times daily.   doxycycline (VIBRA-TABS) 100 MG tablet    DROPLET INSULIN SYRINGE 31G X 5/16" 0.3 ML MISC USE FIVE TIMES DAILY AS DIRECTED   empagliflozin (JARDIANCE) 10 MG TABS tablet    glucose blood (ACCU-CHEK AVIVA PLUS) test strip    insulin glargine  (LANTUS) 100 UNIT/ML injection Inject 0.15 mLs (15 Units total) into the skin at bedtime.   insulin lispro (HUMALOG) 100 UNIT/ML injection Inject 4 Units into the skin See admin instructions. Inject 4 units SQ with breakfast, inject 4 units SQ with lunch and inject 4 units SQ with dinner   Insulin Syringe-Needle U-100 31G X 5/16" 0.3 ML MISC    lisinopril-hydrochlorothiazide (PRINZIDE,ZESTORETIC) 20-25 MG tablet Take by mouth.   metFORMIN (GLUCOPHAGE) 1000 MG tablet TAKE 1 TABLET TWICE DAILY FOR DIABETES   metFORMIN (GLUCOPHAGE) 500 MG tablet Take 500 mg by mouth 2 (two) times daily with a meal.    Multiple Vitamins-Minerals (CENTROVITE) TABS Take 1 tablet by mouth daily.   oxyCODONE (OXY IR/ROXICODONE) 5 MG immediate release tablet Take 1 tablet (5 mg total) by mouth every 4 (four) hours as needed for moderate pain.   polyethylene glycol (MIRALAX / GLYCOLAX) packet Take 17 g by mouth daily.   rivaroxaban (XARELTO) 20 MG TABS tablet Take 20 mg by mouth daily with supper.   senna (SENOKOT) 8.6 MG TABS tablet Take 1 tablet by mouth 2 (two) times daily.   silver sulfADIAZINE (SILVADENE) 1 % cream Apply 1 application topically daily. Apply to right foot ulcer BID   Simethicone (GAS-X ULTRA STRENGTH PO) Take 1 capsule by mouth 4 (four) times daily as needed (for flatulence).    Past Medical History:  Diagnosis Date   Arthritis    Bladder incontinence    Chronic kidney disease    Stage 3 Kidney disease   Dementia (HCC)    Diabetes mellitus without complication ([place])    DVT of leg (deep venous thrombosis) ([place])    H/O RIGHT LEG 2018   Dyspnea    occassional   Hyperlipidemia    Hypertension    Peripheral vascular disease ([place])    Sepsis ([place])     Past Surgical History:  Procedure Laterality Date   ABDOMINAL AORTOGRAM W/LOWER EXTREMITY N/A 10/10/2016   Procedure: Abdominal Aortogram w/Lower Extremity;  Surgeon: Katha Cabal, MD;  Location: [facility];  Service: Cardiovascular;   Laterality: N/A;   CATARACT EXTRACTION W/PHACO Left 05/27/2018   Procedure: CATARACT EXTRACTION PHACO AND INTRAOCULAR LENS PLACEMENT (IOC);  Surgeon: Eulogio Bear, MD;  Location: [facility];  Service: Ophthalmology;  Laterality: Left;  [place] 03:06.2 AP% 16.1 CDE 30.16 FLUID PACK LOT @ [id number]   CIRCUMCISION  1998   EYE SURGERY Left    Retina Detachment   GRAFT APPLICATION Right [id number]   Procedure: GRAFT APPLICATION ( RIGHT FOOT );  Surgeon: Katha Cabal, MD;  Location: [facility];  Service: Vascular;  Laterality: Right;  graft taken from patients right thigh   IRRIGATION AND DEBRIDEMENT FOOT Right 10/03/2016   Procedure: [place];  Surgeon: Samara Deist, DPM;  Location: [facility];  Service: Podiatry;  Laterality: Right;   LOWER EXTREMITY INTERVENTION  10/10/2016   Procedure: Lower Extremity Intervention;  Surgeon: Katha Cabal, MD;  Location: [facility];  Service: Cardiovascular;;   PERIPHERAL VASCULAR BALLOON ANGIOPLASTY Left 10/10/2016   Procedure: Peripheral Vascular Balloon Angioplasty;  Surgeon: Katha Cabal, MD;  Location: [facility];  Service: Cardiovascular;  Laterality: Left;   TONSILLECTOMY     WOUND DEBRIDEMENT Right 11/07/2016   Procedure: DEBRIDEMENT OF WOUND AND BONE RIGHT FOOT AND APPLY WOUND VAC;  Surgeon: Samara Deist, DPM;  Location: [facility];  Service: Podiatry;  Laterality: Right;    Social History Social History   Tobacco Use   Smoking status: Former    Types: Pipe    Quit date: 05/23/1975    Years since quitting: 46.1   Smokeless tobacco: Never  Vaping Use   Vaping Use: Never used  Substance Use Topics   Alcohol use: No   Drug use: No    Family History Family History  Problem Relation Age of Onset   Diabetes Mother    Hypertension Mother    Diabetes Father    Hypertension Father     Allergies  Allergen Reactions   Sulfa Antibiotics Rash   Zosyn [Piperacillin Sod-Tazobactam So] Rash      REVIEW OF SYSTEMS (Negative unless checked)  Constitutional: [] Weight loss  [] Fever  [] Chills Cardiac: [] Chest pain   [] Chest pressure   [] Palpitations   [] Shortness of breath when laying flat   [] Shortness of breath with exertion. Vascular:  [] Pain in legs with walking   [] Pain in legs at rest  [] History of DVT   [] Phlebitis   [x] Swelling in legs   [] Varicose veins   [] Non-healing ulcers Pulmonary:   [] Uses home oxygen   [] Productive cough   [] Hemoptysis   [] Wheeze  [] COPD   [] Asthma Neurologic:  [] Dizziness   [] Seizures   [] History of stroke   [] History of TIA  [] Aphasia   [] Vissual changes   [] Weakness or numbness in arm   [] Weakness or numbness  in leg Musculoskeletal:   [] Joint swelling   [] Joint pain   [] Low back pain Hematologic:  [] Easy bruising  [] Easy bleeding   [] Hypercoagulable state   [] Anemic Gastrointestinal:  [] Diarrhea   [] Vomiting  [] Gastroesophageal reflux/heartburn   [] Difficulty swallowing. Genitourinary:  [] Chronic kidney disease   [] Difficult urination  [] Frequent urination   [] Blood in urine Skin:  [] Rashes   [] Ulcers  Psychological:  [] History of anxiety   []  History of major depression.  Physical Examination  Vitals:   07/15/21 1324  BP: 136/66  Pulse: 98  Weight: 274 lb (124.3 kg)  Height: 5\' 11"  (1.803 m)   Body mass index is 38.22 kg/m. Gen: WD/WN, NAD Head: [place]/AT, No temporalis wasting.  Ear/Nose/Throat: Hearing grossly intact, nares w/o erythema or drainage, pinna without lesions Eyes: PER, EOMI, sclera nonicteric.  Neck: Supple, no gross masses.  No JVD.  Pulmonary:  Good air movement, no audible wheezing, no use of accessory muscles.  Cardiac: RRR, precordium not hyperdynamic. Vascular:  Unna boot on the right.  Moderate venous stasis changes to the legs bilaterally.  2-3+ soft pitting edema  Vessel Right Left  Radial Palpable Palpable  Gastrointestinal: soft, non-distended. No guarding/no peritoneal signs.  Musculoskeletal: M/S 5/5  throughout.  No deformity.  Neurologic: CN 2-12 intact. Pain and light touch intact in extremities.  Symmetrical.  Speech is fluent. Motor exam as listed above. Psychiatric: Judgment intact, Mood & affect appropriate for pt's clinical situation. Dermatologic: Venous rashes no ulcers noted.  No changes consistent with cellulitis. Lymph : No lichenification or skin changes of chronic lymphedema.  CBC Lab Results  Component Value Date   WBC 5.3 05/22/2017   HGB 13.4 05/22/2017   HCT 39.8 (L) 05/22/2017   MCV 83.3 05/22/2017   PLT 266 05/22/2017    BMET    Component Value Date/Time   NA 139 05/22/2017 0853   NA 140 04/04/2013 1238   K 4.4 05/22/2017 0853   K 4.1 04/04/2013 1238   CL 104 05/22/2017 0853   CL 108 (H) 04/04/2013 1238   CO2 28 05/22/2017 0853   CO2 30 04/04/2013 1238   GLUCOSE 90 05/22/2017 0853   GLUCOSE 98 04/04/2013 1238   BUN 13 05/22/2017 0853   BUN 19 (H) 04/04/2013 1238   CREATININE 0.91 05/22/2017 0853   CREATININE 0.87 04/04/2013 1238   CALCIUM 9.3 05/22/2017 0853   CALCIUM 8.8 04/04/2013 1238   GFRNONAA >60 05/22/2017 0853   GFRNONAA >60 04/04/2013 1238   GFRAA >60 05/22/2017 0853   GFRAA >60 04/04/2013 1238   CrCl cannot be calculated (Patient's most recent lab result is older than the maximum 21 days allowed.).  COAG Lab Results  Component Value Date   INR 2.28 05/22/2017   INR 1.00 11/30/2016    Radiology No results found.   Assessment/Plan 1. Venous ulcer ([place]) Ulcer is managed by the [facility]  Report is the wound is much improved  2. Chronic venous insufficiency  No surgery or intervention at this point in time.    I have reviewed my discussion with the patient regarding lymphedema and why it  causes symptoms.  Patient will continue wearing graduated compression stockings class 1 (20-30 mmHg) on a daily basis a prescription was given. The patient is reminded to put the stockings on first thing in the morning and removing them  in the evening. The patient is instructed specifically not to sleep in the stockings.   In addition, behavioral modification throughout the day will be  continued.  This will include frequent elevation (such as in a recliner), use of over the counter pain medications as needed and exercise such as walking.  I have reviewed systemic causes for chronic edema such as liver, kidney and cardiac etiologies and there does not appear to be any significant changes in these organ systems over the past year.  The patient is under the impression that these organ systems are all stable and unchanged.    The patient will continue aggressive use of the  lymph pump.  This will continue to improve the edema control and prevent sequela such as ulcers and infections.   The patient will follow-up with me on an annual basis.    3. Lymphedema  No surgery or intervention at this point in time.    I have reviewed my discussion with the patient regarding lymphedema and why it  causes symptoms.  Patient will continue wearing graduated compression stockings class 1 (20-30 mmHg) on a daily basis a prescription was given. The patient is reminded to put the stockings on first thing in the morning and removing them in the evening. The patient is instructed specifically not to sleep in the stockings.   In addition, behavioral modification throughout the day will be continued.  This will include frequent elevation (such as in a recliner), use of over the counter pain medications as needed and exercise such as walking.  I have reviewed systemic causes for chronic edema such as liver, kidney and cardiac etiologies and there does not appear to be any significant changes in these organ systems over the past year.  The patient is under the impression that these organ systems are all stable and unchanged.    The patient will continue aggressive use of the  lymph pump.  This will continue to improve the edema control and prevent sequela  such as ulcers and infections.   The patient will follow-up with me on an annual basis.    4. Type 2 diabetes mellitus with diabetic peripheral angiopathy and gangrene, with long-term current use of insulin (HCC) Continue hypoglycemic medications as already ordered, these medications have been reviewed and there are no changes at this time.  Hgb A1C to be monitored as already arranged by primary service   5. Primary hypertension Continue antihypertensive medications as already ordered, these medications have been reviewed and there are no changes at this time.     Levora Dredge, MD  07/15/2021 1:32 PM

## 2021-07-19 ENCOUNTER — Other Ambulatory Visit: Payer: Self-pay

## 2021-07-19 ENCOUNTER — Encounter: Payer: Medicare HMO | Admitting: Physician Assistant

## 2021-07-19 DIAGNOSIS — L97512 Non-pressure chronic ulcer of other part of right foot with fat layer exposed: Secondary | ICD-10-CM | POA: Diagnosis not present

## 2021-07-19 NOTE — Progress Notes (Signed)
[name], [name] ([id number]) Visit Report for 07/19/2021 Arrival Information Details Patient Name: [name] Date of Service: 07/19/2021 2:45 PM Medical Record Number: [id number] Patient Account Number: [id number] Date of Birth/Sex: 1951-07-28 (70 y.o. M) Treating RN: Cornell Barman Primary Care Anmarie Fukushima: Beverlyn Roux Other Clinician: Referring Atanacio Melnyk: Beverlyn Roux Treating Lauralei Clouse/Extender: Skipper Cliche in Treatment: 11 Visit Information History Since Last Visit Added or deleted any medications: No Patient Arrived: Walker Has Dressing in Place as Prescribed: Yes Arrival Time: 15:00 Has Compression in Place as Prescribed: Yes Accompanied By: self Pain Present Now: Yes Transfer Assistance: None Patient Identification Verified: Yes Secondary Verification Process Completed: Yes Patient Has Alerts: Yes Patient Alerts: Patient on Blood Thinner Type II Diabetic 23m aspirin Electronic Signature(s) Signed: 07/19/2021 3:56:18 PM By: WGretta Cool BSN, RN, CWS, Kim RN, BSN Entered By: WGretta Cool BSN, RN, CWS, Kim on 07/19/2021 15:01:14 [name]([id number] -------------------------------------------------------------------------------- Compression Therapy Details Patient Name: Jerry PettiesDate of Service: 07/19/2021 2:45 PM Medical Record Number: [id number] Account Number: [id number] of Birth/Sex: 1May 18, 1952((70y.o. M) Treating RN: WCornell BarmanPrimary Care Venetia Prewitt: ABeverlyn RouxOther Clinician: Referring Kolbe Delmonaco: ABeverlyn RouxTreating Huyen Perazzo/Extender: SSkipper Clichein Treatment: 11 Compression Therapy Performed for Wound Assessment: Wound #1 Right,Proximal,Lateral Foot Performed By: Clinician WCornell Barman RN Compression Type: Three Layer Post Procedure Diagnosis Same as Pre-procedure Electronic Signature(s) Signed: 07/19/2021 3:56:18 PM By: WGretta Cool BSN, RN, CWS, Kim RN, BSN Entered By: WGretta Cool BSN, RN, CWS, Kim on 07/19/2021  15:31:52 [name]([id number] -------------------------------------------------------------------------------- Encounter Discharge Information Details Patient Name: FTAKODA, JANOWIAKDate of Service: 07/19/2021 2:45 PM Medical Record Number: [id number] Account Number: [id number] of Birth/Sex: [birth year]((69y.o. M) Treating RN: WCornell BarmanPrimary Care Antwanette Wesche: ABeverlyn RouxOther Clinician: Referring Henchy Mccauley: ABeverlyn RouxTreating Raeley Gilmore/Extender: SSkipper Clichein Treatment: 11 Encounter Discharge Information Items Discharge Condition: Stable Ambulatory Status: Walker Discharge Destination: Home Transportation: Private Auto Accompanied By: self Schedule Follow-up Appointment: Yes Clinical Summary of Care: Electronic Signature(s) Signed: 07/19/2021 3:56:18 PM By: WGretta Cool BSN, RN, CWS, Kim RN, BSN Entered By: WGretta Cool BSN, RN, CWS, Kim on 07/19/2021 15:54:01 [name]([id number] -------------------------------------------------------------------------------- Lower Extremity Assessment Details Patient Name: FMARCELLE, BEBOUTDate of Service: 07/19/2021 2:45 PM Medical Record Number: [id number] Account Number: [id number] of Birth/Sex: 1October 04, 1952((70y.o. M) Treating RN: WCornell BarmanPrimary Care Lavalle Skoda: ABeverlyn RouxOther Clinician: Referring Himani Corona: ABeverlyn RouxTreating Zakiyah Diop/Extender: SSkipper Clichein Treatment: 11 Edema Assessment Assessed: [Left: No] [Right: Yes] Edema: [Left: Ye] [Right: s] Calf Left: Right: Point of Measurement: 31 cm From Medial Instep 42.3 cm Ankle Left: Right: Point of Measurement: 10 cm From Medial Instep 27 cm Vascular Assessment Pulses: Dorsalis Pedis Palpable: [Right:Yes] Electronic Signature(s) Signed: 07/19/2021 3:56:18 PM By: WGretta Cool BSN, RN, CWS, Kim RN, BSN Entered By: WGretta Cool BSN, RN, CWS, Kim on 07/19/2021 15:13:43 [name] ([id number] -------------------------------------------------------------------------------- Multi Wound Chart Details Patient Name: Jerry PettiesDate of Service: 07/19/2021 2:45 PM Medical Record Number: [id number] Account Number: [id number] of Birth/Sex: 1Nov 21, 1952((70y.o. M) Treating RN: WCornell BarmanPrimary Care Meli Faley: ABeverlyn RouxOther Clinician: Referring Dalonda Simoni: ABeverlyn RouxTreating Letica Giaimo/Extender: SSkipper Clichein Treatment: 11 Vital Signs Height(in): 71 Pulse(bpm): 763Weight(lbs): 255 Blood Pressure(mmHg): 137/73 Body Mass Index(BMI): 36 Temperature(F): 97.8 Respiratory Rate(breaths/min): 18 Photos: [N/A:N/A] Wound Location: Right, Proximal, Lateral Foot N/A N/A Wounding Event: Gradually Appeared N/A N/A Primary Etiology: Diabetic Wound/Ulcer of the Lower N/A N/A Extremity Comorbid  History: Cataracts, Lymphedema, Deep Vein N/A N/A Thrombosis, Hypertension, Type II Diabetes, Osteoarthritis, Osteomyelitis Date Acquired: 05/03/2020 N/A N/A Weeks of Treatment: 11 N/A N/A Wound Status: Open N/A N/A Measurements L x W x D (cm) 0.3x0.3x0.1 N/A N/A Area (cm) : 0.071 N/A N/A Volume (cm) : 0.007 N/A N/A % Reduction in Area: 97.60% N/A N/A % Reduction in Volume: 99.60% N/A N/A Classification: Grade 2 N/A N/A Exudate Amount: Medium N/A N/A Exudate Type: Serosanguineous N/A N/A Exudate Color: red, brown N/A N/A Granulation Amount: Large (67-100%) N/A N/A Granulation Quality: Red N/A N/A Necrotic Amount: None Present (0%) N/A N/A Exposed Structures: Fat Layer (Subcutaneous Tissue): N/A N/A Yes Fascia: No Tendon: No Muscle: No Joint: No Bone: No Epithelialization: Small (1-33%) N/A N/A Treatment Notes Electronic Signature(s) Signed: 07/19/2021 3:56:18 PM By: Gretta Cool, BSN, RN, CWS, Kim RN, BSN Previous Signature: 07/19/2021 3:20:55 PM Version By: Gretta Cool, BSN, RN, CWS, Kim RN, BSN Entered By: Gretta Cool, BSN, RN, CWS, Kim on 07/19/2021  15:31:02 Maia Horn ([id number]) -------------------------------------------------------------------------------- [place] Details Patient Name: [name]. Date of Service: 07/19/2021 2:45 PM Medical Record Number: [id number] Patient Account Number: [id number] Date of Birth/Sex: [birth year] (70 y.o. M) Treating RN: Cornell Barman Primary Care Marty Sadlowski: Beverlyn Roux Other Clinician: Referring Ekansh Sherk: Beverlyn Roux Treating Coraline Talwar/Extender: Skipper Cliche in Treatment: 11 Active Inactive Wound/Skin Impairment Nursing Diagnoses: Impaired tissue integrity Goals: Patient/caregiver will verbalize understanding of skin care regimen Date Initiated: 05/03/2021 Date Inactivated: 07/19/2021 Target Resolution Date: 05/03/2021 Goal Status: Met Ulcer/skin breakdown will have a volume reduction of 30% by week 4 Date Initiated: 05/03/2021 Date Inactivated: 07/19/2021 Target Resolution Date: 06/02/2021 Goal Status: Met Ulcer/skin breakdown will have a volume reduction of 50% by week 8 Date Initiated: 05/03/2021 Date Inactivated: 07/19/2021 Target Resolution Date: 07/03/2021 Goal Status: Met Ulcer/skin breakdown will have a volume reduction of 80% by week 12 Date Initiated: 05/03/2021 Date Inactivated: 07/19/2021 Target Resolution Date: 08/02/2021 Goal Status: Met Ulcer/skin breakdown will heal within 14 weeks Date Initiated: 05/03/2021 Target Resolution Date: 09/02/2021 Goal Status: Active Interventions: Assess patient/caregiver ability to obtain necessary supplies Assess patient/caregiver ability to perform ulcer/skin care regimen upon admission and as needed Assess ulceration(s) every visit Provide education on ulcer and skin care Treatment Activities: Referred to DME Dyllan Hughett for dressing supplies : 05/03/2021 Skin care regimen initiated : 05/03/2021 Notes: Electronic Signature(s) Signed: 07/19/2021 3:56:18 PM By: Gretta Cool, BSN, RN, CWS, Kim RN, BSN Previous  Signature: 07/19/2021 3:20:38 PM Version By: Gretta Cool, BSN, RN, CWS, Kim RN, BSN Previous Signature: 07/19/2021 3:20:27 PM Version By: Gretta Cool, BSN, RN, CWS, Kim RN, BSN Entered By: Gretta Cool, BSN, RN, CWS, Kim on 07/19/2021 15:48:39 Maia Horn ([id number]) -------------------------------------------------------------------------------- Pain Assessment Details Patient Name: CONSUELO, THAYNE. Date of Service: 07/19/2021 2:45 PM Medical Record Number: [id number] Patient Account Number: [id number] Date of Birth/Sex: [birth year] (70 y.o. M) Treating RN: Cornell Barman Primary Care Jo-Anne Kluth: Beverlyn Roux Other Clinician: Referring Ehab Humber: Beverlyn Roux Treating Barbee Mamula/Extender: Skipper Cliche in Treatment: 11 Active Problems Location of Pain Severity and Description of Pain Patient Has Paino No Site Locations Pain Management and Medication Current Pain Management: Notes Patient denies pain at this time. Electronic Signature(s) Signed: 07/19/2021 3:56:18 PM By: Gretta Cool, BSN, RN, CWS, Kim RN, BSN Entered By: Gretta Cool, BSN, RN, CWS, Kim on 07/19/2021 15:05:22 Maia Horn ([id number]) -------------------------------------------------------------------------------- Patient/Caregiver Education Details Patient Name: STONY, STEGMANN Date of Service: 07/19/2021 2:45 PM Medical Record Number: [id number] Patient Account Number: [id number] Date of Birth/Gender: 05/27/1951 (70 y.o. M) Treating  RN: Cornell Barman Primary Care Physician: Beverlyn Roux Other Clinician: Referring Physician: Beverlyn Roux Treating Physician/Extender: Skipper Cliche in Treatment: 11 Education Assessment Education Provided To: Patient Education Topics Provided Wound/Skin Impairment: Handouts: Caring for Your Ulcer Methods: Demonstration, Explain/Verbal Responses: State content correctly Electronic Signature(s) Signed: 07/19/2021 3:56:18 PM By: Gretta Cool, BSN, RN, CWS, Kim RN, BSN Entered By: Gretta Cool, BSN, RN,  CWS, Kim on 07/19/2021 15:52:55 Maia Horn ([id number]) -------------------------------------------------------------------------------- Wound Assessment Details Patient Name: Maia Horn Date of Service: 07/19/2021 2:45 PM Medical Record Number: [id number] Patient Account Number: [id number] Date of Birth/Sex: Jan 26, 1951 (70 y.o. M) Treating RN: Cornell Barman Primary Care Venise Ellingwood: Beverlyn Roux Other Clinician: Referring Leisel Pinette: Beverlyn Roux Treating Evan Mackie/Extender: Skipper Cliche in Treatment: 11 Wound Status Wound Number: 1 Primary Diabetic Wound/Ulcer of the Lower Extremity Etiology: Wound Location: Right, Proximal, Lateral Foot Wound Healed - Epithelialized Wounding Event: Gradually Appeared Status: Date Acquired: 05/03/2020 Comorbid Cataracts, Lymphedema, Deep Vein Thrombosis, Weeks Of Treatment: 11 History: Hypertension, Type II Diabetes, Osteoarthritis, Clustered Wound: No Osteomyelitis Photos Wound Measurements Length: (cm) 0 % Re Width: (cm) 0 % Re Depth: (cm) 0 Epit Area: (cm) 0 Tun Volume: (cm) 0 Und duction in Area: 100% duction in Volume: 100% helialization: Small (1-33%) neling: No ermining: No Wound Description Classification: Grade 2 Fou Exudate Amount: Medium Slo Exudate Type: Serosanguineous Exudate Color: red, brown l Odor After Cleansing: No ugh/Fibrino No Wound Bed Granulation Amount: Large (67-100%) Exposed Structure Granulation Quality: Red Fascia Exposed: No Necrotic Amount: None Present (0%) Fat Layer (Subcutaneous Tissue) Exposed: Yes Tendon Exposed: No Muscle Exposed: No Joint Exposed: No Bone Exposed: No Treatment Notes Wound #1 (Foot) Wound Laterality: Right, Lateral, Proximal Cleanser Peri-Wound Care Topical Primary Dressing NIMESH, RIOLO ([id number]) Secondary Dressing Secured With Compression Wrap Compression Stockings Add-Ons Notes Three-layer wrap applied Electronic Signature(s) Signed:  07/19/2021 3:56:18 PM By: Gretta Cool, BSN, RN, CWS, Kim RN, BSN Entered By: Gretta Cool, BSN, RN, CWS, Kim on 07/19/2021 15:49:25 Maia Horn ([id number]) -------------------------------------------------------------------------------- Vitals Details Patient Name: Maia Horn Date of Service: 07/19/2021 2:45 PM Medical Record Number: [id number] Patient Account Number: [id number] Date of Birth/Sex: 1951/01/25 (70 y.o. M) Treating RN: Cornell Barman Primary Care Kaleyah Labreck: Beverlyn Roux Other Clinician: Referring Wlliam Grosso: Beverlyn Roux Treating Carrye Goller/Extender: Skipper Cliche in Treatment: 11 Vital Signs Time Taken: 15:04 Temperature (F): 97.8 Height (in): 71 Pulse (bpm): 76 Weight (lbs): 255 Respiratory Rate (breaths/min): 18 Body Mass Index (BMI): 35.6 Blood Pressure (mmHg): 137/73 Reference Range: 80 - 120 mg / dl Electronic Signature(s) Signed: 07/19/2021 3:56:18 PM By: Gretta Cool, BSN, RN, CWS, Kim RN, BSN Entered By: Gretta Cool, BSN, RN, CWS, Kim on 07/19/2021 15:05:00

## 2021-07-19 NOTE — Progress Notes (Addendum)
Jerry, Horn ([id number]) Visit Report for 07/19/2021 Chief Complaint Document Details Patient Name: [name], [name]. Date of Service: 07/19/2021 2:45 PM Medical Record Number: [id number] Patient Account Number: [id number] Date of Birth/Sex: 1951/02/04 (70 y.o. M) Treating RN: Huel Coventry Primary Care Provider: Beverely Low Other Clinician: Referring Provider: Beverely Low Treating Provider/Extender: Rowan Blase in Treatment: 11 Information Obtained from: Patient Chief Complaint Bilateral foot ulcers Electronic Signature(s) Signed: 07/19/2021 3:27:29 PM By: Lenda Kelp PA-C Entered By: Lenda Kelp on 07/19/2021 15:27:29 [name] ([id number]) -------------------------------------------------------------------------------- HPI Details Patient Name: [name] Date of Service: 07/19/2021 2:45 PM Medical Record Number: [id number] Patient Account Number: [id number] Date of Birth/Sex: 12-22-1950 (70 y.o. 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 Has been seen by [facility]iatry [facility] for a number of years it sounds like at least 4 years. With that being said they have tried a split thickness skin graft along with multiple other topical remedies over the time they have been seeing him over the past several years. Unfortunately they are not able to get this closed I did refer him to [place] for further evaluation and treatment. The patient tells me has been quite sometime since he  remembers having an x-ray and again we could not find anything recent when looking through his chart. Fortunately there does not appear to be any signs of active infection systemically which is great news. No fevers, chills, nausea, vomiting, or diarrhea. The patient does have diabetes with his most recent hemoglobin A1c being November 14, 2020 at 9.1. He also does have evidence of lymphedema based on what I am seeing today. 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 05/10/2021 upon evaluation today patient appears to be doing well with regard to his wounds in general. I feel like he is making progress. Fortunately the compression wrap did well for him and overall I am extremely happy with where we stand. No fevers, chills, nausea, vomiting, or diarrhea. 05/17/2021 upon evaluation today patient appears to be doing better in general regard to his wounds. Overall I feel like he is actually signs of good improvement which is great news and I think that we are headed in the right direction. There does not appear to be any signs of active infection at this time. No fevers, chills, nausea, vomiting, or diarrhea. 05/24/2021 upon evaluation today patient appears to be doing well with regard to his wounds. He is actually showing signs of improvement this is great news. Its mainly the right foot proximal wound that is going to require debridement today and again I do need to try to clear away some of the lip where he had some epiboly I then work on this week by week but if we get that cleared away I think this will heal much more effectively and quickly. 05/31/2021 upon evaluation today patient appears to be doing well at this point in regard to  his wounds. Both are showing signs of improvement and very pleased in that regard. Fortunately there does not appear to  be any evidence of active infection systemically which is great news. No fevers, chills, nausea, vomiting, or diarrhea. 06/07/2021 upon evaluation today patient appears to be making excellent progress in regard to his foot ulcers. Both are showing signs of excellent improvement. Fortunately there does not appear to be any evidence of active infection which is great news. No fevers, chills, nausea, vomiting, or diarrhea. 06/21/2021 upon evaluation today patient appears to be doing well with regard to his wound. In fact he is making excellent progress and I am extremely pleased with where things stand today. There does not appear to be any signs of active infection at this time. 06/28/2021 upon evaluation patient's foot ulcer actually is showing signs of good improvement. I am actually extremely pleased with how we stand today and I think that he does require some sharp debridement at this point but nothing in comparison what we have had to do before. Overall I think were headed in the appropriate direction he is very pleased to hear this. 07/05/2021 upon evaluation today patient appears to be doing well with regard to his foot ulcer. In fact this is showing signs of excellent improvement which is great news. No fevers, chills, nausea, vomiting, or diarrhea. 07/12/2021 upon evaluation today patient appears to be doing well with regard to his wound. He has been tolerating the dressing changes without complication. Fortunately this is showing signs of excellent improvement which is great news. No fevers, chills, nausea, vomiting, or diarrhea. 07/19/2021 upon evaluation today patient appears to be doing well with regard to his leg ulcer in fact this appears to be completely closed at this point. I do not see anything open but I do believe we will get a need to continue to wrap him to help with compression therapy. He needs compression socks and I do not think he is quite ready to put those on yet he also  tells me that he needs to order some. Electronic Signature(s) Signed: 07/19/2021 4:50:12 PM By: Lenda Kelp PA-C Entered By: Lenda Kelp on 07/19/2021 16:50:12 [name] ([id number]) -------------------------------------------------------------------------------- Physical Exam Details Patient Name: [name], [name] Date of Service: 07/19/2021 2:45 PM Medical Record Number: [id number] Patient Account Number: [id number] Date of Birth/Sex: 07-23-1951 (70 y.o. M) Treating RN: Huel Coventry Primary Care Provider: Beverely Low Other Clinician: Referring Provider: Beverely Low Treating Provider/Extender: Rowan Blase in Treatment: 11 Constitutional Well-nourished and well-hydrated in no acute distress. Respiratory normal breathing without difficulty. Psychiatric this patient is able to make decisions and demonstrates good insight into disease process. Alert and Oriented x 3. pleasant and cooperative. Notes Upon inspection patient's wound bed actually showed signs of good granulation and in fact this appears to be completely filled and and he has new skin covering over the surface of the wound which is great as well. Electronic Signature(s) Signed: 07/19/2021 4:50:40 PM By: Lenda Kelp PA-C Entered By: Lenda Kelp on 07/19/2021 16:50:40 [name] ([id number]) -------------------------------------------------------------------------------- Physician Orders Details Patient Name: [name] Date of Service: 07/19/2021 2:45 PM Medical Record Number: [id number] Patient Account Number: [id number] Date of Birth/Sex: Jul 19, 1951 (70 y.o. 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 o Nurse Visit as needed Bathing/ Shower/ Hygiene o May shower with wound dressing protected with water repellent cover or cast protector. o No tub bath. Edema Control - Lymphedema / Segmental Compressive Device / Other Right Lower Extremity o Elevate, Exercise Daily and Avoid Standing for Long Periods of Time. o Elevate legs to the level of the heart and pump ankles as often as possible o Elevate leg(s) parallel to the floor when sitting. o Compression Pump: Use compression pump on left lower extremity for 60 minutes, twice daily. o Compression Pump: Use compression pump on right lower extremity for 60 minutes, twice daily. o Other: - Three-layer compression wrap applied. Measurements and information given to patient for Compression socks from [facility]. Will bring to next visit. Electronic Signature(s) Signed: 07/19/2021 3:56:18 PM By: Elliot Gurney, BSN, RN, CWS, Kim RN, BSN Signed: 07/19/2021 5:42:25 PM By: Lenda Kelp PA-C Entered By: Elliot Gurney BSN, RN, CWS, Kim on 07/19/2021 15:51:51 [name] ([id number]) -------------------------------------------------------------------------------- Problem List Details Patient Name: [name], [name] Date of Service: 07/19/2021 2:45 PM Medical Record Number: [id number] Patient Account Number: [id number] Date of Birth/Sex: 03-28-1951 (70 y.o. M) Treating RN: Huel Coventry Primary Care Provider: Beverely Low Other Clinician: Referring Provider: Beverely Low Treating Provider/Extender: Rowan Blase in Treatment:  11 Active Problems ICD-10 Encounter Code Description Active Date MDM Diagnosis E11.621 Type 2 diabetes mellitus with foot ulcer 05/03/2021 No Yes L97.512 Non-pressure chronic ulcer of other part of right foot with fat layer 05/03/2021 No Yes exposed L97.522 Non-pressure chronic ulcer of other part of left foot with fat layer 05/03/2021 No Yes exposed Z89.421 Acquired absence of other right toe(s) 05/03/2021 No Yes I10 Essential (primary) hypertension 05/03/2021 No Yes Z79.01 Long term (current) use of anticoagulants 05/03/2021 No Yes Z86.718 Personal history of other venous thrombosis and embolism 05/03/2021 No Yes Inactive Problems Resolved Problems Electronic Signature(s) Signed: 07/19/2021 3:27:17 PM By: Lenda Kelp PA-C Entered By: Lenda Kelp on 07/19/2021 15:27:17 [name] ([id number]) -------------------------------------------------------------------------------- Progress Note Details Patient Name: [name] Date of Service: 07/19/2021 2:45 PM Medical Record Number: [id number] Patient Account Number: [id number] Date of Birth/Sex: [birth year] (70 y.o. 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 Has been seen by [facility] for a number of years it sounds like at least 4 years. With that being said they have tried a split thickness skin graft along with multiple other topical remedies over the time they have been  seeing him over the past several years. Unfortunately they are not able to get this closed I did refer him to [place] for further evaluation and treatment. The patient tells me has  been quite sometime since he remembers having an x-ray and again we could not find anything recent when looking through his chart. Fortunately there does not appear to be any signs of active infection systemically which is great news. No fevers, chills, nausea, vomiting, or diarrhea. The patient does have diabetes with his most recent hemoglobin A1c being November 14, 2020 at 9.1. He also does have evidence of lymphedema based on what I am seeing today. 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 05/10/2021 upon evaluation today patient appears to be doing well with regard to his wounds in general. I feel like he is making progress. Fortunately the compression wrap did well for him and overall I am extremely happy with where we stand. No fevers, chills, nausea, vomiting, or diarrhea. 05/17/2021 upon evaluation today patient appears to be doing better in general regard to his wounds. Overall I feel like he is actually signs of good improvement which is great news and I think that we are headed in the right direction. There does not appear to be any signs of active infection at this time. No fevers, chills, nausea, vomiting, or diarrhea. 05/24/2021 upon evaluation today patient appears to be doing well with regard to his wounds. He is actually showing signs of improvement this is great news. Its mainly the right foot proximal wound that is going to require debridement today and again I do need to try to clear away some of the lip where he had some epiboly I then work on this week by week but if we get that cleared away I think this will heal much more effectively and  quickly. 05/31/2021 upon evaluation today patient appears to be doing well at this point in regard to his wounds. Both are showing signs of improvement and very pleased in that regard. Fortunately there does not appear to be any evidence of active infection systemically which is great news. No fevers, chills, nausea, vomiting, or diarrhea. 06/07/2021 upon evaluation today patient appears to be making excellent progress in regard to his foot ulcers. Both are showing signs of excellent improvement. Fortunately there does not appear to be any evidence of active infection which is great news. No fevers, chills, nausea, vomiting, or diarrhea. 06/21/2021 upon evaluation today patient appears to be doing well with regard to his wound. In fact he is making excellent progress and I am extremely pleased with where things stand today. There does not appear to be any signs of active infection at this time. 06/28/2021 upon evaluation patient's foot ulcer actually is showing signs of good improvement. I am actually extremely pleased with how we stand today and I think that he does require some sharp debridement at this point but nothing in comparison what we have had to do before. Overall I think were headed in the appropriate direction he is very pleased to hear this. 07/05/2021 upon evaluation today patient appears to be doing well with regard to his foot ulcer. In fact this is showing signs of excellent improvement which is great news. No fevers, chills, nausea, vomiting, or diarrhea. 07/12/2021 upon evaluation today patient appears to be doing well with regard to his wound. He has been tolerating the dressing changes without complication. Fortunately this is showing signs of excellent improvement which is great news. No fevers, chills, nausea, vomiting, or diarrhea. 07/19/2021 upon evaluation  today patient appears to be doing well with regard to his leg ulcer in fact this appears to be completely closed at  this point. I do not see anything open but I do believe we will get a need to continue to wrap him to help with compression therapy. He needs compression socks and I do not think he is quite ready to put those on yet he also tells me that he needs to order some. [name], [name] ([id number]) Objective Constitutional Well-nourished and well-hydrated in no acute distress. Vitals Time Taken: 3:04 PM, Height: 71 in, Weight: 255 lbs, BMI: 35.6, Temperature: 97.8 F, Pulse: 76 bpm, Respiratory Rate: 18 breaths/min, Blood Pressure: 137/73 mmHg. Respiratory normal breathing without difficulty. Psychiatric this patient is able to make decisions and demonstrates good insight into disease process. Alert and Oriented x 3. pleasant and cooperative. General Notes: Upon inspection patient's wound bed actually showed signs of good granulation and in fact this appears to be completely filled and and he has new skin covering over the surface of the wound which is great as well. Integumentary (Hair, Skin) Wound #1 status is Healed - Epithelialized. Original cause of wound was Gradually Appeared. The date acquired was: 05/03/2020. The wound has been in treatment 11 weeks. The wound is located on the Right,Proximal,Lateral Foot. The wound measures 0cm length x 0cm width x 0cm depth; 0cm^2 area and 0cm^3 volume. There is Fat Layer (Subcutaneous Tissue) exposed. There is no tunneling or undermining noted. There is a medium amount of serosanguineous drainage noted. There is large (67-100%) red granulation within the wound bed. There is no necrotic tissue within the wound bed. 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 There was a Three Layer Compression Therapy Procedure by Huel Coventry, RN. Post procedure Diagnosis Wound #1: Same as Pre-Procedure Plan Follow-up Appointments: Return Appointment in 1 week. Nurse Visit as needed Bathing/ Shower/ Hygiene: May shower with wound dressing protected with water repellent cover or cast protector. No tub bath. Edema Control - Lymphedema / Segmental Compressive Device / Other: Elevate, Exercise Daily and Avoid Standing for Long Periods of Time. Elevate legs to the level of the heart and pump ankles as often as possible Elevate leg(s) parallel to the floor when sitting. Compression Pump: Use compression pump on left lower extremity for 60 minutes, twice daily. Compression Pump: Use compression pump on right lower extremity for 60 minutes, twice daily. [name], [name] ([id number]) Other: - Three-layer compression wrap applied. Measurements and information given to patient for Compression socks from [facility]. Will bring to next visit. 1. Would recommend currently that going continue with the compression wrap. This is keeping his edema under good control we will put just a protective dressing over and then the 3 layer compression wrap. 2. I am also can recommend that he needs to get some compression stockings between now and the next time that I see him. We will see patient back for reevaluation in 1 week here in the clinic. If anything worsens or changes patient will contact our office for additional recommendations. Therapy  nurse visit and I will see him in 2 weeks for provider visit. Electronic Signature(s) Signed: 07/19/2021 4:51:15 PM By: Lenda Kelp PA-C Entered By: Lenda Kelp on 07/19/2021 16:51:15 [name]  ([id number]) -------------------------------------------------------------------------------- SuperBill Details Patient Name: [name] Date of Service: 07/19/2021 Medical Record Number: [id number] Patient Account Number: [id number] Date of Birth/Sex: April 27, 1951 (70 y.o. M) Treating RN: Huel Coventry Primary Care Provider: Beverely Low Other Clinician: Referring Provider: Beverely Low Treating Provider/Extender: Rowan Blase in Treatment: 11 Diagnosis Coding ICD-10 Codes Code Description E11.621 Type 2 diabetes mellitus with foot ulcer L97.512 Non-pressure chronic ulcer of other part of right foot with fat layer exposed L97.522 Non-pressure chronic ulcer of other part of left foot with fat layer exposed Z89.421 Acquired absence of other right toe(s) I10 Essential (primary) hypertension Z79.01 Long term (current) use of anticoagulants Z86.718 Personal history of other venous thrombosis and embolism Facility Procedures CPT4 Code: [id number] Description: (Facility Use Only) [phone] - APPLY MULTLAY COMPRS LWR RT LEG Modifier: Quantity: 1 Physician Procedures CPT4 Code: [id number] Description: 99213 - WC PHYS LEVEL 3 - EST PT Modifier: Quantity: 1 CPT4 Code: Description: ICD-10 Diagnosis Description E11.621 Type 2 diabetes mellitus with foot ulcer L97.512 Non-pressure chronic ulcer of other part of right foot with fat layer e L97.522 Non-pressure chronic ulcer of other part of left foot with fat layer ex  Z89.421 Acquired absence of other right toe(s) Modifier: xposed posed Quantity: Electronic Signature(s) Signed: 07/19/2021 4:51:50 PM By: Lenda Kelp PA-C Previous Signature: 07/19/2021 3:56:18 PM Version By: Elliot Gurney, BSN, RN, CWS, Kim RN, BSN Entered By: Lenda Kelp on 07/19/2021 16:51:50

## 2021-07-29 ENCOUNTER — Other Ambulatory Visit: Payer: Self-pay

## 2021-07-29 ENCOUNTER — Encounter: Payer: Medicare HMO | Admitting: Physician Assistant

## 2021-07-29 DIAGNOSIS — L97512 Non-pressure chronic ulcer of other part of right foot with fat layer exposed: Secondary | ICD-10-CM | POA: Diagnosis not present

## 2021-07-29 NOTE — Progress Notes (Addendum)
EVIN, LOISEAU ([id number]) Visit Report for 07/29/2021 Chief Complaint Document Details Patient Name: [name], [name]. Date of Service: 07/29/2021 8:00 AM Medical Record Number: [id number] Patient Account Number: [id number] Date of Birth/Sex: 1951-02-16 (70 y.o. M) Treating RN: Hansel Feinstein Primary Care Provider: Beverely Low Other Clinician: Referring Provider: Beverely Low Treating Provider/Extender: Rowan Blase in Treatment: 12 Information Obtained from: Patient Chief Complaint Bilateral foot ulcers Electronic Signature(s) Signed: 07/29/2021 8:31:02 AM By: Lenda Kelp PA-C Entered By: Lenda Kelp on 07/29/2021 08:31:02 [name] ([id number]) -------------------------------------------------------------------------------- HPI Details Patient Name: [name] Date of Service: 07/29/2021 8:00 AM Medical Record Number: [id number] Patient Account Number: [id number] Date of Birth/Sex: February 05, 1951 (70 y.o. 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 Has been seen by [facility]iatry [facility] for a number of years it sounds like at least 4 years. With that being said they have tried a split thickness skin graft along with multiple other topical remedies over the time they have been seeing him over the past several years. Unfortunately they are not able to get this closed I did refer him to [place] for further evaluation and treatment. The patient tells me has been quite sometime since he  remembers having an x-ray and again we could not find anything recent when looking through his chart. Fortunately there does not appear to be any signs of active infection systemically which is great news. No fevers, chills, nausea, vomiting, or diarrhea. The patient does have diabetes with his most recent hemoglobin A1c being November 14, 2020 at 9.1. He also does have evidence of lymphedema based on what I am seeing today. 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 05/10/2021 upon evaluation today patient appears to be doing well with regard to his wounds in general. I feel like he is making progress. Fortunately the compression wrap did well for him and overall I am extremely happy with where we stand. No fevers, chills, nausea, vomiting, or diarrhea. 05/17/2021 upon evaluation today patient appears to be doing better in general regard to his wounds. Overall I feel like he is actually signs of good improvement which is great news and I think that we are headed in the right direction. There does not appear to be any signs of active infection at this time. No fevers, chills, nausea, vomiting, or diarrhea. 05/24/2021 upon evaluation today patient appears to be doing well with regard to his wounds. He is actually showing signs of improvement this is great news. Its mainly the right foot proximal wound that is going to require debridement today and again I do need to try to clear away some of the lip where he had some epiboly I then work on this week by week but if we get that cleared away I think this will heal much more effectively and quickly. 05/31/2021 upon evaluation today patient appears to be doing well at this point in regard to  his wounds. Both are showing signs of improvement and very pleased in that regard. Fortunately there does not appear to  be any evidence of active infection systemically which is great news. No fevers, chills, nausea, vomiting, or diarrhea. 06/07/2021 upon evaluation today patient appears to be making excellent progress in regard to his foot ulcers. Both are showing signs of excellent improvement. Fortunately there does not appear to be any evidence of active infection which is great news. No fevers, chills, nausea, vomiting, or diarrhea. 06/21/2021 upon evaluation today patient appears to be doing well with regard to his wound. In fact he is making excellent progress and I am extremely pleased with where things stand today. There does not appear to be any signs of active infection at this time. 06/28/2021 upon evaluation patient's foot ulcer actually is showing signs of good improvement. I am actually extremely pleased with how we stand today and I think that he does require some sharp debridement at this point but nothing in comparison what we have had to do before. Overall I think were headed in the appropriate direction he is very pleased to hear this. 07/05/2021 upon evaluation today patient appears to be doing well with regard to his foot ulcer. In fact this is showing signs of excellent improvement which is great news. No fevers, chills, nausea, vomiting, or diarrhea. 07/12/2021 upon evaluation today patient appears to be doing well with regard to his wound. He has been tolerating the dressing changes without complication. Fortunately this is showing signs of excellent improvement which is great news. No fevers, chills, nausea, vomiting, or diarrhea. 07/19/2021 upon evaluation today patient appears to be doing well with regard to his leg ulcer in fact this appears to be completely closed at this point. I do not see anything open but I do believe we will get a need to continue to wrap him to help with compression therapy. He needs compression socks and I do not think he is quite ready to put those on yet he also  tells me that he needs to order some. 07/29/2021 on evaluation today patient still appears to be doing well in fact everything still is closed although he did swell a bit over the past week. Fortunately there does not appear to be any signs of active infection at this time which is great news. Electronic Signature(s) Signed: 07/29/2021 5:11:24 PM By: Lenda Kelp PA-C Entered By: Lenda Kelp on 07/29/2021 17:11:24 [name] ([id number]) -------------------------------------------------------------------------------- Physical Exam Details Patient Name: [name], [name] Date of Service: 07/29/2021 8:00 AM Medical Record Number: [id number] Patient Account Number: [id number] Date of Birth/Sex: [birth year] (70 y.o. M) Treating RN: Hansel Feinstein Primary Care Provider: Beverely Low Other Clinician: Referring Provider: Beverely Low Treating Provider/Extender: Rowan Blase in Treatment: 12 Constitutional Obese and well-hydrated in no acute distress. Respiratory normal breathing without difficulty. Psychiatric this patient is able to make decisions and demonstrates good insight into disease process. Alert and Oriented x 3. pleasant and cooperative. Notes Patient's wound bed did not require any sharp debridement today and again there does not appear to be any specific open wound which is great news. Nonetheless I think the swelling is causing things to weep a little bit around the area where the wounds have recently healed that is where the drainage seems to be coming from. Nonetheless I do think we need to try to do what we can to keep this under control for the time being I am not quite ready to  completely discharge him currently. Electronic Signature(s) Signed: 07/29/2021 5:11:53 PM By: Lenda Kelp PA-C Entered By: Lenda Kelp on 07/29/2021 17:11:53 [name]  ([id number]) -------------------------------------------------------------------------------- Physician Orders Details Patient Name: [name] Date of Service: 07/29/2021 8:00 AM Medical Record Number: [id number] Patient Account Number: [id number] Date of Birth/Sex: [birth year] (70 y.o. 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 o Nurse Visit as needed Bathing/ Shower/ Hygiene o May shower with wound dressing protected with water repellent cover or cast protector. o No tub bath. Edema Control - Lymphedema / Segmental Compressive Device / Other Right Lower Extremity o Optional: One layer of unna paste to top of compression wrap (to act as an anchor). o 3 Layer Compression System for Lymphedema. - right o Patient to wear own compression stockings. Remove compression stockings every night before going to bed and put on every morning when getting up. - Bring your compression socks to appt when they arrive in the mail o Elevate, Exercise Daily and Avoid Standing for Long Periods of Time. o Elevate legs to the level of the heart and pump ankles as often as possible o Elevate leg(s) parallel to the floor when sitting. o Compression Pump: Use compression pump on left lower extremity for 60  minutes, twice daily. o Compression Pump: Use compression pump on right lower extremity for 60 minutes, twice daily. Non-Wound Condition o Cleanse affected area with antibacterial soap and water, o Additional non-wound orders/instructions: - apply alginate and ABD for small drainage leaking area around newly healed skin Additional Orders / Instructions o Follow Nutritious Diet and Increase Protein Intake Electronic Signature(s) Signed: 07/29/2021 12:07:25 PM By: Hansel Feinstein Signed: 07/29/2021 5:14:38 PM By: Lenda Kelp PA-C Entered By: Hansel Feinstein on 07/29/2021 08:36:28 [name] ([id number]) -------------------------------------------------------------------------------- Problem List Details Patient Name: [name] Date of Service: 07/29/2021 8:00 AM Medical Record Number: [id number] Patient Account Number: [id number] Date of Birth/Sex: [birth year] (70 y.o. M) Treating RN: Hansel Feinstein Primary Care Provider: Beverely Low Other Clinician: Referring Provider: Beverely Low Treating Provider/Extender: Rowan Blase in Treatment: 12 Active Problems ICD-10 Encounter Code Description Active Date MDM Diagnosis E11.621 Type 2 diabetes mellitus with foot ulcer 05/03/2021 No Yes L97.512 Non-pressure chronic ulcer of other part of right foot with fat layer 05/03/2021 No Yes exposed L97.522 Non-pressure chronic ulcer of other part of left foot with fat layer 05/03/2021 No Yes exposed Z89.421 Acquired absence of other right toe(s) 05/03/2021 No Yes I10 Essential (primary) hypertension 05/03/2021 No Yes Z79.01 Long term (current) use of anticoagulants 05/03/2021 No Yes Z86.718 Personal history of other venous thrombosis and embolism 05/03/2021 No Yes Inactive Problems Resolved Problems Electronic Signature(s) Signed: 07/29/2021 8:30:46 AM By: Lenda Kelp PA-C Entered By: Lenda Kelp on 07/29/2021 08:30:45 [name]  ([id number]) -------------------------------------------------------------------------------- Progress Note Details Patient Name: [name] Date of Service: 07/29/2021 8:00 AM Medical Record Number: [id number] Patient Account Number:  [id number] Date of Birth/Sex: March 11, 1951 (70 y.o. 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 Has been seen by [facility]ry [facility] for a number of years it sounds like at least 4 years. With that being said they have tried a split thickness skin graft along with multiple other topical remedies over the time they have been seeing him over the past several years. Unfortunately they are not able to get this closed I did refer him to [place] for further evaluation and treatment. The patient tells me has been quite sometime since he remembers having an x-ray and again we could not find anything recent when looking through his chart. Fortunately there does not appear to be any signs of active infection systemically which is great news. No fevers, chills, nausea, vomiting, or diarrhea. The patient does have diabetes with his most recent hemoglobin A1c being November 14, 2020 at 9.1. He also does have evidence of lymphedema based on what I am seeing today. 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 05/10/2021 upon evaluation today patient appears to be doing well with regard to his wounds in general. I feel like he is making progress. Fortunately the compression wrap did well for him and overall I am extremely happy with where we stand. No fevers, chills, nausea, vomiting, or diarrhea. 05/17/2021 upon evaluation today patient appears to be doing better in general regard to his wounds. Overall I feel like he is actually signs of good improvement which is great news and I think that we are headed in the right direction. There does not appear to be any signs of active infection at this time. No fevers, chills, nausea, vomiting, or diarrhea. 05/24/2021 upon evaluation today patient appears to be doing well with regard to his wounds. He is actually showing signs of improvement this is great news. Its mainly the right foot proximal wound that is going to require debridement today and again I do need to try to clear away some of the lip where he had some epiboly I then work on this week by week but if we get that cleared away I think this will heal much more effectively and quickly. 05/31/2021 upon evaluation today patient appears to be doing well at this point in regard to his wounds. Both are showing signs of improvement and very pleased in that regard. Fortunately there does not appear to be any evidence of active infection systemically which is great news. No fevers, chills, nausea, vomiting, or diarrhea. 06/07/2021 upon evaluation today patient appears to be making excellent progress in regard to his foot ulcers. Both are showing signs of excellent improvement. Fortunately there does not appear to be any evidence of active infection which is great news. No fevers, chills, nausea, vomiting, or diarrhea. 06/21/2021 upon evaluation today patient appears to be doing well with regard to his wound. In fact he is making  excellent progress and I am extremely pleased with where  things stand today. There does not appear to be any signs of active infection at this time. 06/28/2021 upon evaluation patient's foot ulcer actually is showing signs of good improvement. I am actually extremely pleased with how we stand today and I think that he does require some sharp debridement at this point but nothing in comparison what we have had to do before. Overall I think were headed in the appropriate direction he is very pleased to hear this. 07/05/2021 upon evaluation today patient appears to be doing well with regard to his foot ulcer. In fact this is showing signs of excellent improvement which is great news. No fevers, chills, nausea, vomiting, or diarrhea. 07/12/2021 upon evaluation today patient appears to be doing well with regard to his wound. He has been tolerating the dressing changes without complication. Fortunately this is showing signs of excellent improvement which is great news. No fevers, chills, nausea, vomiting, or diarrhea. 07/19/2021 upon evaluation today patient appears to be doing well with regard to his leg ulcer in fact this appears to be completely closed at this point. I do not see anything open but I do believe we will get a need to continue to wrap him to help with compression therapy. He needs compression socks and I do not think he is quite ready to put those on yet he also tells me that he needs to order some. 07/29/2021 on evaluation today patient still appears to be doing well in fact everything still is closed although he did swell a bit over the past week. Fortunately there does not appear to be any signs of active infection at this time which is great news. [name], [name] ([id number]) Objective Constitutional Obese and well-hydrated in no acute distress. Vitals Time Taken: 8:04 AM, Height: 71 in, Weight: 255 lbs, BMI: 35.6, Temperature: 97.7 F, Pulse: 73 bpm, Respiratory Rate: 16 breaths/min, Blood Pressure: 123/74 mmHg. Respiratory normal  breathing without difficulty. Psychiatric this patient is able to make decisions and demonstrates good insight into disease process. Alert and Oriented x 3. pleasant and cooperative. General Notes: Patient's wound bed did not require any sharp debridement today and again there does not appear to be any specific open wound which is great news. Nonetheless I think the swelling is causing things to weep a little bit around the area where the wounds have recently healed that is where the drainage seems to be coming from. Nonetheless I do think we need to try to do what we can to keep this under control for the time being I am not quite ready to completely discharge him currently. Other Condition(s) Patient presents with Lymphedema located on the Right Leg. 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 Post procedure Diagnosis Wound #: Same as Pre-Procedure Plan Follow-up Appointments: Return Appointment in 1 week. Nurse Visit as needed Bathing/ Shower/ Hygiene: May shower with wound dressing protected with water repellent cover or cast protector. No tub bath. Edema Control - Lymphedema / Segmental Compressive Device / Other: Optional: One layer of unna paste to top of compression wrap (  to act as an anchor). 3 Layer Compression System for Lymphedema. - right Patient to wear own compression stockings. Remove compression stockings every night before going to bed and put on every morning when getting up. - Bring your compression socks to appt when they arrive  in the mail Elevate, Exercise Daily and Avoid Standing for Long Periods of Time. Elevate legs to the level of the heart and pump ankles as often as possible Elevate leg(s) parallel to the floor when sitting. [name], [name] ([id number]) Compression Pump: Use compression pump on left lower extremity for 60 minutes, twice daily. Compression Pump: Use compression pump on right lower extremity for 60 minutes, twice daily. Non-Wound Condition: Cleanse affected area with antibacterial soap and water, Additional non-wound orders/instructions: - apply alginate and ABD for small drainage leaking area around newly healed skin Additional Orders / Instructions: Follow Nutritious Diet and Increase Protein Intake 1. Would recommend currently that we going to continue with the wound care measures as before and the patient is in agreement with plan this includes the use of the silver alginate over the area where there was some drainage just to make sure to keep this nice and dry. We will use an ABD pad over top of that. 2. I am also can recommend we continue with the compression wrap at this time which I think is doing well for him as well. 3. By the time he comes in He should have his compression stockings we may be ready for those I hope. We will see patient back for reevaluation in 1 week here in the clinic. If anything worsens or changes patient will contact our office for additional recommendations. Electronic Signature(s) Signed: 07/29/2021 5:12:32 PM By: Lenda Kelp PA-C Entered By: Lenda Kelp on 07/29/2021 17:12:31 [name] ([id number]) -------------------------------------------------------------------------------- SuperBill Details Patient Name: [name] Date of Service: 07/29/2021 Medical Record Number: [id number] Patient Account Number: [id number] Date of Birth/Sex: Mar 10, 1951 (70 y.o. 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 Z89.421 Acquired absence of other right toe(s) I10 Essential (primary) hypertension Z79.01 Long term (current) use of anticoagulants Z86.718 Personal history of other venous thrombosis and embolism Facility Procedures CPT4 Code: 08657846 Description: (Facility Use Only) [id number] - APPLY MULTLAY COMPRS LWR RT LEG Modifier: Quantity: 1 Physician Procedures CPT4 Code: 4132440 Description: 99213 - WC PHYS LEVEL 3 - EST PT Modifier: Quantity: 1 CPT4 Code: Description: ICD-10 Diagnosis Description E11.621 Type 2 diabetes mellitus with foot ulcer L97.512 Non-pressure chronic ulcer of other part of right foot with fat layer e L97.522 Non-pressure chronic ulcer of other part of left foot with fat layer ex  Z89.421 Acquired absence of other right toe(s) Modifier: xposed posed Quantity: Electronic Signature(s) Signed: 07/29/2021 5:12:57 PM By: Lenda Kelp PA-C Previous Signature: 07/29/2021 12:07:25 PM Version By: Hansel Feinstein Entered By: Lenda Kelp on 07/29/2021 17:12:57

## 2021-07-29 NOTE — Progress Notes (Signed)
SAYID, MOLL ([id number]) Visit Report for 07/29/2021 Arrival Information Details Patient Name: NAHIEM, DREDGE Date of Service: 07/29/2021 8:00 AM Medical Record Number: [id number] Patient Account Number: [id number] Date of Birth/Sex: 05/06/1951 (70 y.o. M) Treating RN: Hansel Feinstein Primary Care Sybel Standish: Beverely Low Other Clinician: Referring Evani Shrider: Beverely Low Treating Clementina Mareno/Extender: Rowan Blase in Treatment: 12 Visit Information History Since Last Visit Added or deleted any medications: No Patient Arrived: Dan Humphreys Had a fall or experienced change in No Arrival Time: 08:05 activities of daily living that may affect Accompanied By: self risk of falls: Transfer Assistance: None Hospitalized since last visit: No Patient Identification Verified: Yes Has Dressing in Place as Prescribed: Yes Secondary Verification Process Completed: Yes Pain Present Now: No Patient Has Alerts: Yes Patient Alerts: Patient on Blood Thinner Type II Diabetic 81mg  aspirin non compressible ABI 1/22 Electronic Signature(s) Signed: 07/29/2021 12:07:25 PM By: 07/31/2021 Entered By: Hansel Feinstein on 07/29/2021 08:38:05 07/31/2021 ([name]) -------------------------------------------------------------------------------- Clinic Level of Care Assessment Details Patient Name: [id number] Date of Service: 07/29/2021 8:00 AM Medical Record Number: 07/31/2021 Patient Account Number: [id number] Date of Birth/Sex: 15-Jun-1951 (70 y.o. M) Treating RN: (75 Primary Care Jakell Trusty: Hansel Feinstein Other Clinician: Referring Ladasha Schnackenberg: Beverely Low Treating Nabilah Davoli/Extender: Beverely Low in Treatment: 12 Clinic Level of Care Assessment Items TOOL 1 Quantity Score []  - Use when EandM and Procedure is performed on INITIAL visit 0 ASSESSMENTS - Nursing Assessment / Reassessment []  - General Physical Exam (combine w/ comprehensive assessment (listed just below) when  performed on new 0 pt. evals) []  - 0 Comprehensive Assessment (HX, ROS, Risk Assessments, Wounds Hx, etc.) ASSESSMENTS - Wound and Skin Assessment / Reassessment []  - Dermatologic / Skin Assessment (not related to wound area) 0 ASSESSMENTS - Ostomy and/or Continence Assessment and Care []  - Incontinence Assessment and Management 0 []  - 0 Ostomy Care Assessment and Management (repouching, etc.) PROCESS - Coordination of Care []  - Simple Patient / Family Education for ongoing care 0 []  - 0 Complex (extensive) Patient / Family Education for ongoing care []  - 0 Staff obtains Rowan Blase, Records, Test Results / Process Orders []  - 0 Staff telephones HHA, Nursing Homes / Clarify orders / etc []  - 0 Routine Transfer to another Facility (non-emergent condition) []  - 0 Routine Hospital Admission (non-emergent condition) []  - 0 New Admissions / / Ordering NPWT, Apligraf, etc. []  - 0 Emergency Hospital Admission (emergent condition) PROCESS - Special Needs []  - Pediatric / Minor Patient Management 0 []  - 0 Isolation Patient Management []  - 0 Hearing / Language / Visual special needs []  - 0 Assessment of Community assistance (transportation, D/C planning, etc.) []  - 0 Additional assistance / Altered mentation []  - 0 Support Surface(s) Assessment (bed, cushion, seat, etc.) INTERVENTIONS - Miscellaneous []  - External ear exam 0 []  - 0 Patient Transfer (multiple staff / / Similar devices) []  - 0 Simple Staple / Suture removal (25 or less) []  - 0 Complex Staple / Suture removal (26 or more) []  - 0 Hypo/Hyperglycemic Management (do not check if billed separately) []  - 0 Ankle / Brachial Index (ABI) - do not check if billed separately Has the patient been seen at the hospital within the last three years: Yes Total Score: 0 Level Of Care: ____ ( ) Electronic Signature(s) Signed: 07/29/2021 12:07:25 PM By: Entered By: on 07/29/2021 08:37:14 (Chiropractor) -------------------------------------------------------------------------------- Compression Therapy Details Patient Name: ,  Carzell O. Date of Service: 07/29/2021 8:00 AM Medical Record Number: [id number] Patient Account Number: [id number] Date of Birth/Sex: 02-02-1951 (70 y.o. M) Treating RN: Hansel Feinstein Primary Care Alfreddie Consalvo: Beverely Low Other Clinician: Referring Imya Mance: Beverely Low Treating Areonna Bran/Extender: Rowan Blase in Treatment: 12 Compression Therapy Performed for Wound Assessment: NonWound Condition Lymphedema - Right Leg Performed By: Clinician Hansel Feinstein, RN Compression Type: Three Layer Post Procedure Diagnosis Same as Pre-procedure Electronic Signature(s) Signed: 07/29/2021 12:07:25 PM By: Hansel Feinstein Entered By: Hansel Feinstein on 07/29/2021 08:36:59 [name] ([id number]) -------------------------------------------------------------------------------- Encounter Discharge Information Details Patient Name: [name] Date of Service: 07/29/2021 8:00 AM Medical Record Number: [id number] Patient Account Number: [id number] Date of Birth/Sex: 03/07/1951 (70 y.o. M) Treating RN: Hansel Feinstein Primary Care Abel Hageman: Beverely Low Other Clinician: Referring Dekota Kirlin: Beverely Low Treating Tienna Bienkowski/Extender: Rowan Blase in Treatment: 12 Encounter Discharge Information Items Discharge Condition: Stable Ambulatory Status: Walker Discharge Destination: Home Transportation: Private Auto Accompanied By: self Schedule Follow-up Appointment: Yes Clinical Summary of Care: Electronic Signature(s) Signed: 07/29/2021 12:07:25 PM By: Hansel Feinstein Entered By: Hansel Feinstein on 07/29/2021 08:44:14 [name] ([id number]) -------------------------------------------------------------------------------- Lower Extremity Assessment Details Patient Name:  [name] Date of Service: 07/29/2021 8:00 AM Medical Record Number: [id number] Patient Account Number: [id number] Date of Birth/Sex: 1950-12-05 (70 y.o. M) Treating RN: Hansel Feinstein Primary Care Roland Lipke: Beverely Low Other Clinician: Referring Quamesha Mullet: Beverely Low Treating Cailynn Bodnar/Extender: Rowan Blase in Treatment: 12 Edema Assessment Assessed: [Left: No] [Right: Yes] [Left: Edema] [Right: :] Calf Left: Right: Point of Measurement: 31 cm From Medial Instep 42.5 cm Ankle Left: Right: Point of Measurement: 10 cm From Medial Instep 26.5 cm Vascular Assessment Pulses: Dorsalis Pedis Palpable: [Right:Yes] Electronic Signature(s) Signed: 07/29/2021 12:07:25 PM By: Hansel Feinstein Entered By: Hansel Feinstein on 07/29/2021 08:14:27 [name] ([id number]) -------------------------------------------------------------------------------- Multi Wound Chart Details Patient Name: [name] Date of Service: 07/29/2021 8:00 AM Medical Record Number: [id number] Patient Account Number: [id number] Date of Birth/Sex: 17-Feb-1951 (71 y.o. M) Treating RN: Hansel Feinstein Primary Care Yeiden Frenkel: Beverely Low Other Clinician: Referring Fayne Mcguffee: Beverely Low Treating Orianna Biskup/Extender: Rowan Blase in Treatment: 12 Vital Signs Height(in): 71 Pulse(bpm): 73 Weight(lbs): 255 Blood Pressure(mmHg): 123/74 Body Mass Index(BMI): 36 Temperature(F): 97.7 Respiratory Rate(breaths/min): 16 Wound Assessments Treatment Notes Electronic Signature(s) Signed: 07/29/2021 12:07:25 PM By: Hansel Feinstein Entered By: Hansel Feinstein on 07/29/2021 08:15:33 [name] ([id number]) -------------------------------------------------------------------------------- Multi-Disciplinary Care Plan Details Patient Name: [name] Date of Service: 07/29/2021 8:00 AM Medical Record Number: [id number] Patient Account Number: [id number] Date of Birth/Sex: [birth year] (70 y.o.  M) Treating RN: Hansel Feinstein Primary Care Baleria Wyman: Beverely Low Other Clinician: Referring Dejane Scheibe: Beverely Low Treating Edla Para/Extender: Rowan Blase in Treatment: 12 Active Inactive Electronic Signature(s) Signed: 07/29/2021 12:07:25 PM By: Hansel Feinstein Entered By: Hansel Feinstein on 07/29/2021 08:15:17 [name] ([id number]) -------------------------------------------------------------------------------- Non-Wound Condition Assessment Details Patient Name: [name] Date of Service: 07/29/2021 8:00 AM Medical Record Number: [id number] Patient Account Number: [id number] Date of Birth/Sex: 07-16-1951 (70 y.o. M) Treating RN: Hansel Feinstein Primary Care Jalik Gellatly: Beverely Low Other Clinician: Referring Gloria Lambertson: Beverely Low Treating Pura Picinich/Extender: Allen Derry Weeks in Treatment: 12 Non-Wound Condition: Condition: Lymphedema Location: Leg Side: Right Photos Electronic Signature(s) Signed: 07/29/2021 12:07:25 PM By: Hansel Feinstein Entered By: Hansel Feinstein on 07/29/2021 08:14:59 [name] ([id number]) -------------------------------------------------------------------------------- Pain Assessment Details Patient Name: [name] Date of Service: 07/29/2021 8:00 AM Medical Record Number: [id number] Patient Account Number: [id number] Date of Birth/Sex: April 30, 1951 (70  y.o. M) Treating RN: Hansel Feinstein Primary Care Reigna Ruperto: Beverely Low Other Clinician: Referring Frederich Montilla: Beverely Low Treating Hermenegildo Clausen/Extender: Rowan Blase in Treatment: 12 Active Problems Location of Pain Severity and Description of Pain Patient Has Paino No Site Locations Rate the pain. Current Pain Level: 0 Pain Management and Medication Current Pain Management: Electronic Signature(s) Signed: 07/29/2021 12:07:25 PM By: Hansel Feinstein Entered By: Hansel Feinstein on 07/29/2021 08:09:36 [name]  ([id number]) -------------------------------------------------------------------------------- Patient/Caregiver Education Details Patient Name: [name] Date of Service: 07/29/2021 8:00 AM Medical Record Number: [id number] Patient Account Number: [id number] Date of Birth/Gender: 03-26-1951 (70 y.o. M) Treating RN: Hansel Feinstein Primary Care Physician: Beverely Low Other Clinician: Referring Physician: Beverely Low Treating Physician/Extender: Rowan Blase in Treatment: 12 Education Assessment Education Provided To: Patient Education Topics Provided Basic Hygiene: Venous: Wound/Skin Impairment: Electronic Signature(s) Signed: 07/29/2021 12:07:25 PM By: Hansel Feinstein Entered By: Hansel Feinstein on 07/29/2021 08:32:34 [name] ([id number]) -------------------------------------------------------------------------------- Vitals Details Patient Name: [name] Date of Service: 07/29/2021 8:00 AM Medical Record Number: [id number] Patient Account Number: [id number] Date of Birth/Sex: December 14, 1950 (70 y.o. M) Treating RN: Hansel Feinstein Primary Care Courtnei Ruddell: Beverely Low Other Clinician: Referring Zacari Radick: Beverely Low Treating Hayze Gazda/Extender: Rowan Blase in Treatment: 12 Vital Signs Time Taken: 08:04 Temperature (F): 97.7 Height (in): 71 Pulse (bpm): 73 Weight (lbs): 255 Respiratory Rate (breaths/min): 16 Body Mass Index (BMI): 35.6 Blood Pressure (mmHg): 123/74 Reference Range: 80 - 120 mg / dl Electronic Signature(s) Signed: 07/29/2021 12:07:25 PM By: Hansel Feinstein Entered ByHansel Feinstein on 07/29/2021 08:07:41

## 2021-08-05 ENCOUNTER — Encounter: Payer: Medicare HMO | Attending: Physician Assistant | Admitting: Physician Assistant

## 2021-08-05 ENCOUNTER — Other Ambulatory Visit: Payer: Self-pay

## 2021-08-05 DIAGNOSIS — L97512 Non-pressure chronic ulcer of other part of right foot with fat layer exposed: Secondary | ICD-10-CM | POA: Diagnosis not present

## 2021-08-05 DIAGNOSIS — Z7901 Long term (current) use of anticoagulants: Secondary | ICD-10-CM | POA: Insufficient documentation

## 2021-08-05 DIAGNOSIS — Z86718 Personal history of other venous thrombosis and embolism: Secondary | ICD-10-CM | POA: Insufficient documentation

## 2021-08-05 DIAGNOSIS — E11621 Type 2 diabetes mellitus with foot ulcer: Secondary | ICD-10-CM | POA: Insufficient documentation

## 2021-08-05 DIAGNOSIS — Z89421 Acquired absence of other right toe(s): Secondary | ICD-10-CM | POA: Diagnosis not present

## 2021-08-05 DIAGNOSIS — L97522 Non-pressure chronic ulcer of other part of left foot with fat layer exposed: Secondary | ICD-10-CM | POA: Insufficient documentation

## 2021-08-05 DIAGNOSIS — I10 Essential (primary) hypertension: Secondary | ICD-10-CM | POA: Insufficient documentation

## 2021-08-05 NOTE — Progress Notes (Addendum)
[name], Horn ([id number]) Visit Report for 08/05/2021 Chief Complaint Document Details Patient Name: [name]. Date of Service: 08/05/2021 2:00 PM Medical Record Number: [id number] Patient Account Number: [id number] Date of Birth/Sex: [birth year] (70 y.o. M) Treating RN: Yevonne Pax Primary Care Provider: Beverely Low Other Clinician: Referring Provider: Beverely Low Treating Provider/Extender: Rowan Blase in Treatment: 13 Information Obtained from: Patient Chief Complaint Bilateral foot ulcers Electronic Signature(s) Signed: 08/05/2021 2:15:18 PM By: Lenda Kelp PA-C Entered By: Lenda Kelp on 08/05/2021 14:15:18 [name] ([id number]) -------------------------------------------------------------------------------- HPI Details Patient Name: [name] Date of Service: 08/05/2021 2:00 PM Medical Record Number: [id number] Patient Account Number: [id number] Date of Birth/Sex: 03/12/1951 (70 y.o. 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 Has been seen by [facility]iatry [facility] for a number of years it sounds like at least 4 years. With that being said they have tried a split thickness skin graft along with multiple other topical remedies over the time they have been seeing him over the past several years. Unfortunately they are not able to get this closed I did refer him to [place] for further evaluation and treatment. The patient tells me has been quite sometime since he  remembers having an x-ray and again we could not find anything recent when looking through his chart. Fortunately there does not appear to be any signs of active infection systemically which is great news. No fevers, chills, nausea, vomiting, or diarrhea. The patient does have diabetes with his most recent hemoglobin A1c being November 14, 2020 at 9.1. He also does have evidence of lymphedema based on what I am seeing today. 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 05/10/2021 upon evaluation today patient appears to be doing well with regard to his wounds in general. I feel like he is making progress. Fortunately the compression wrap did well for him and overall I am extremely happy with where we stand. No fevers, chills, nausea, vomiting, or diarrhea. 05/17/2021 upon evaluation today patient appears to be doing better in general regard to his wounds. Overall I feel like he is actually signs of good improvement which is great news and I think that we are headed in the right direction. There does not appear to be any signs of active infection at this time. No fevers, chills, nausea, vomiting, or diarrhea. 05/24/2021 upon evaluation today patient appears to be doing well with regard to his wounds. He is actually showing signs of improvement this is great news. Its mainly the right foot proximal wound that is going to require debridement today and again I do need to try to clear away some of the lip where he had some epiboly I then work on this week by week but if we get that cleared away I think this will heal much more effectively and quickly. 05/31/2021 upon evaluation today patient appears to be doing well at this point in regard to  his wounds. Both are showing signs of improvement and very pleased in that regard. Fortunately there does not appear to  be any evidence of active infection systemically which is great news. No fevers, chills, nausea, vomiting, or diarrhea. 06/07/2021 upon evaluation today patient appears to be making excellent progress in regard to his foot ulcers. Both are showing signs of excellent improvement. Fortunately there does not appear to be any evidence of active infection which is great news. No fevers, chills, nausea, vomiting, or diarrhea. 06/21/2021 upon evaluation today patient appears to be doing well with regard to his wound. In fact he is making excellent progress and I am extremely pleased with where things stand today. There does not appear to be any signs of active infection at this time. 06/28/2021 upon evaluation patient's foot ulcer actually is showing signs of good improvement. I am actually extremely pleased with how we stand today and I think that he does require some sharp debridement at this point but nothing in comparison what we have had to do before. Overall I think were headed in the appropriate direction he is very pleased to hear this. 07/05/2021 upon evaluation today patient appears to be doing well with regard to his foot ulcer. In fact this is showing signs of excellent improvement which is great news. No fevers, chills, nausea, vomiting, or diarrhea. 07/12/2021 upon evaluation today patient appears to be doing well with regard to his wound. He has been tolerating the dressing changes without complication. Fortunately this is showing signs of excellent improvement which is great news. No fevers, chills, nausea, vomiting, or diarrhea. 07/19/2021 upon evaluation today patient appears to be doing well with regard to his leg ulcer in fact this appears to be completely closed at this point. I do not see anything open but I do believe we will get a need to continue to wrap him to help with compression therapy. He needs compression socks and I do not think he is quite ready to put those on yet he also  tells me that he needs to order some. 07/29/2021 on evaluation today patient still appears to be doing well in fact everything still is closed although he did swell a bit over the past week. Fortunately there does not appear to be any signs of active infection at this time which is great news. 08/05/2021 upon evaluation today patient appears to be doing well with regard to his wound. In fact this still appears to be healed I do not see any signs of active infection at this time which is great news. No fevers, chills, nausea, vomiting, or diarrhea. Electronic Signature(s) Signed: 08/05/2021 3:57:45 PM By: Lenda Kelp PA-C Entered By: Lenda Kelp on 08/05/2021 15:57:45 [name] ([id number], EASTERLY ([id number]) -------------------------------------------------------------------------------- Physical Exam Details Patient Name: VENTURA, HOLLENBECK Date of Service: 08/05/2021 2:00 PM Medical Record Number: [id number] Patient Account Number: [id number] Date of Birth/Sex: 23-Apr-1951 (70 y.o. M) Treating RN: Yevonne Pax Primary Care Provider: Beverely Low Other Clinician: Referring Provider: Beverely Low Treating Provider/Extender: Rowan Blase in Treatment: 13 Constitutional Well-nourished and well-hydrated in no acute distress. Respiratory normal breathing without difficulty. Psychiatric this patient is able to make decisions and demonstrates good insight into disease process. Alert and Oriented x 3. pleasant and cooperative. Notes Patient's wound bed showed signs of good granulation and epithelization at this point. Fortunately I do not see any signs of infection I think he is doing great in regard to the overall appearance of the  wound bed as well. Electronic Signature(s) Signed: 08/05/2021 3:58:01 PM By: Lenda Kelp PA-C Entered By: Lenda Kelp on 08/05/2021 15:58:01 [name]  ([id number]) -------------------------------------------------------------------------------- Physician Orders Details Patient Name: [name] Date of Service: 08/05/2021 2:00 PM Medical Record Number: [id number] Patient Account Number: [id number] Date of Birth/Sex: 09/09/1950 (70 y.o. 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 o Nurse Visit as needed Bathing/ Shower/ Hygiene o May shower with wound dressing protected with water repellent cover or cast protector. o No tub bath. Edema Control - Lymphedema / Segmental Compressive Device / Other Right Lower Extremity o Optional: One layer of unna paste to top of compression wrap (to act as an anchor). o 3 Layer Compression System for Lymphedema. - right o Patient to wear own compression stockings. Remove compression stockings every night before going to bed and put on every morning when getting up. - Bring your compression socks to appt when they arrive in the mail o Elevate, Exercise Daily and Avoid Standing for Long Periods of Time. o Elevate legs to the level of the heart and pump ankles as often as possible o Elevate leg(s) parallel to the floor when sitting. o Compression Pump: Use compression pump on left lower extremity for 60  minutes, twice daily. o Compression Pump: Use compression pump on right lower extremity for 60 minutes, twice daily. Non-Wound Condition o Cleanse affected area with antibacterial soap and water, o Additional non-wound orders/instructions: - apply alginate and ABD for small drainage leaking area around newly healed skin Additional Orders / Instructions o Follow Nutritious Diet and Increase Protein Intake Electronic Signature(s) Signed: 08/06/2021 9:26:06 AM By: Lenda Kelp PA-C Signed: 08/06/2021 10:19:34 AM By: Yevonne Pax RN Entered By: Yevonne Pax on 08/05/2021 14:21:35 [name] ([id number]) -------------------------------------------------------------------------------- Problem List Details Patient Name: [name] Date of Service: 08/05/2021 2:00 PM Medical Record Number: [id number] Patient Account Number: [id number] Date of Birth/Sex: 23-Oct-1950 (70 y.o. M) Treating RN: Yevonne Pax Primary Care Provider: Beverely Low Other Clinician: Referring Provider: Beverely Low Treating Provider/Extender: Rowan Blase in Treatment: 13 Active Problems ICD-10 Encounter Code Description Active Date MDM Diagnosis E11.621 Type 2 diabetes mellitus with foot ulcer 05/03/2021 No Yes L97.512 Non-pressure chronic ulcer of other part of right foot with fat layer 05/03/2021 No Yes exposed L97.522 Non-pressure chronic ulcer of other part of left foot with fat layer 05/03/2021 No Yes exposed Z89.421 Acquired absence of other right toe(s) 05/03/2021 No Yes I10 Essential (primary) hypertension 05/03/2021 No Yes Z79.01 Long term (current) use of anticoagulants 05/03/2021 No Yes Z86.718 Personal history of other venous thrombosis and embolism 05/03/2021 No Yes Inactive Problems Resolved Problems Electronic Signature(s) Signed: 08/05/2021 2:14:50 PM By: Lenda Kelp PA-C Entered By: Lenda Kelp on 08/05/2021 14:14:47 [name]  ([id number]) -------------------------------------------------------------------------------- Progress Note Details Patient Name: [name] Date of Service: 08/05/2021 2:00 PM Medical Record Number: [id number] Patient Account  Number: [id number] Date of Birth/Sex: 04-04-1951 (70 y.o. 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 Has been seen by [facility] for a number of years it sounds like at least 4 years. With that being said they have tried a split thickness skin graft along with multiple other topical remedies over the time they have been seeing him over the past several years. Unfortunately they are not able to get this closed I did refer him to [place] for further evaluation and treatment. The patient tells me has been quite sometime since he remembers having an x-ray and again we could not find anything recent when looking through his chart. Fortunately there does not appear to be any signs of active infection systemically which is great news. No fevers, chills, nausea, vomiting, or diarrhea. The patient does have diabetes with his most recent hemoglobin A1c being November 14, 2020 at 9.1. He also does have evidence of lymphedema based on what I am seeing today. 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 05/10/2021 upon evaluation today patient appears to be doing well with regard to his wounds in general. I feel like he is making progress. Fortunately the compression wrap did well for him and overall I am extremely happy with where we stand. No fevers, chills, nausea, vomiting, or diarrhea. 05/17/2021 upon evaluation today patient appears to be doing better in general regard to his wounds. Overall I feel like he is actually signs of good improvement which is great news and I think that we are headed in the right direction. There does not appear to be any signs of active infection at this time. No fevers, chills, nausea, vomiting, or diarrhea. 05/24/2021 upon evaluation today patient appears to be doing well with regard to his wounds. He is actually showing signs of improvement this is great news. Its mainly the right foot proximal wound that is going to require debridement today and again I do need to try to clear away some of the lip where he had some epiboly I then work on this week by week but if we get that cleared away I think this will heal much more effectively and quickly. 05/31/2021 upon evaluation today patient appears to be doing well at this point in regard to his wounds. Both are showing signs of improvement and very pleased in that regard. Fortunately there does not appear to be any evidence of active infection systemically which is great news. No fevers, chills, nausea, vomiting, or diarrhea. 06/07/2021 upon evaluation today patient appears to be making excellent progress in regard to his foot ulcers. Both are showing signs of excellent improvement. Fortunately there does not appear to be any evidence of active infection which is great news. No fevers, chills, nausea, vomiting, or diarrhea. 06/21/2021 upon evaluation today patient appears to be doing well with regard to his wound. In fact he is  making excellent progress and I am extremely pleased with where  things stand today. There does not appear to be any signs of active infection at this time. 06/28/2021 upon evaluation patient's foot ulcer actually is showing signs of good improvement. I am actually extremely pleased with how we stand today and I think that he does require some sharp debridement at this point but nothing in comparison what we have had to do before. Overall I think were headed in the appropriate direction he is very pleased to hear this. 07/05/2021 upon evaluation today patient appears to be doing well with regard to his foot ulcer. In fact this is showing signs of excellent improvement which is great news. No fevers, chills, nausea, vomiting, or diarrhea. 07/12/2021 upon evaluation today patient appears to be doing well with regard to his wound. He has been tolerating the dressing changes without complication. Fortunately this is showing signs of excellent improvement which is great news. No fevers, chills, nausea, vomiting, or diarrhea. 07/19/2021 upon evaluation today patient appears to be doing well with regard to his leg ulcer in fact this appears to be completely closed at this point. I do not see anything open but I do believe we will get a need to continue to wrap him to help with compression therapy. He needs compression socks and I do not think he is quite ready to put those on yet he also tells me that he needs to order some. 07/29/2021 on evaluation today patient still appears to be doing well in fact everything still is closed although he did swell a bit over the past week. Fortunately there does not appear to be any signs of active infection at this time which is great news. 08/05/2021 upon evaluation today patient appears to be doing well with regard to his wound. In fact this still appears to be healed I do not see any signs of active infection at this time which is great news. No fevers, chills, nausea, vomiting, or diarrhea. ESCO, JOSLYN  ([id number]) Objective Constitutional Well-nourished and well-hydrated in no acute distress. Vitals Time Taken: 2:00 PM, Height: 71 in, Weight: 255 lbs, BMI: 35.6, Temperature: 97.8 F, Pulse: 86 bpm, Respiratory Rate: 18 breaths/min, Blood Pressure: 146/69 mmHg. Respiratory normal breathing without difficulty. Psychiatric this patient is able to make decisions and demonstrates good insight into disease process. Alert and Oriented x 3. pleasant and cooperative. General Notes: Patient's wound bed showed signs of good granulation and epithelization at this point. Fortunately I do not see any signs of infection I think he is doing great in regard to the overall appearance of the wound bed as well. 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 Post procedure Diagnosis Wound #: Same as Pre-Procedure Plan Follow-up Appointments: Return Appointment in 1 week. Nurse Visit as needed Bathing/ Shower/ Hygiene: May shower with wound dressing protected with water repellent cover or cast protector. No tub bath. Edema Control - Lymphedema / Segmental Compressive Device / Other: Optional: One layer of unna paste to top of compression wrap (to act as an anchor). 3 Layer Compression System for Lymphedema. - right Patient  to wear own compression stockings. Remove compression stockings every night before going to bed and put on every morning when getting up. - Bring your compression socks to appt when they arrive in the mail Elevate, Exercise Daily and Avoid Standing  for Long Periods of Time. Elevate legs to the level of the heart and pump ankles as often as possible Elevate leg(s) parallel to the floor when sitting. Compression Pump: Use compression pump on left lower extremity for 60 minutes, twice daily. Compression Pump: Use compression pump on right lower extremity for 60 minutes, twice daily. PEARSON, PICOU ([id number]) Non-Wound Condition: Cleanse affected area with antibacterial soap and water, Additional non-wound orders/instructions: - apply alginate and ABD for small drainage leaking area around newly healed skin Additional Orders / Instructions: Follow Nutritious Diet and Increase Protein Intake 1. I would recommend that we continue with the compression wrapping for now he is still trying to get his stockings here and they should be arriving in the next couple of days. With that being said in the interim I really think we need to keep him wrapped until we can assure that is not having any drainage with regard to his wounds on the foot. I need to make sure this is completely sealed and will stay such before I just put him in a stocking otherwise we will risk it draining and getting stuck and then pulling off good skin which is not what I want to see. 2. I am also can recommend for the time being that we continue to put a little alginate over top of this area to try to keep it as dry as possible. We will see patient back for reevaluation in 1 week here in the clinic. If anything worsens or changes patient will contact our office for additional recommendations. Electronic Signature(s) Signed: 08/05/2021 3:58:51 PM By: Lenda Kelp PA-C Entered By: Lenda Kelp on 08/05/2021 15:58:50 [name] ([id number]) -------------------------------------------------------------------------------- SuperBill Details Patient Name: [name] Date of Service: 08/05/2021 Medical Record Number: [id number] Patient Account Number:  [id number] Date of Birth/Sex: Feb 14, 1951 (70 y.o. 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

## 2021-08-06 NOTE — Progress Notes (Signed)
HONDO, NANDA ([id number]) Visit Report for 08/05/2021 Arrival Information Details Patient Name: [name], [name] Date of Service: 08/05/2021 2:00 PM Medical Record Number: [id number] Patient Account Number: [id number] Date of Birth/Sex: December 09, 1950 (70 y.o. M) Treating RN: Yevonne Pax Primary Care Devaney Segers: Beverely Low Other Clinician: Referring Kemar Pandit: Beverely Low Treating Tesean Stump/Extender: Rowan Blase in Treatment: 13 Visit Information History Since Last Visit All ordered tests and consults were completed: No Patient Arrived: [name] Added or deleted any medications: No Arrival Time: 13:57 Any new allergies or adverse reactions: No Accompanied By: self Had a fall or experienced change in No Transfer Assistance: None activities of daily living that may affect Patient Identification Verified: Yes risk of falls: Secondary Verification Process Completed: Yes Signs or symptoms of abuse/neglect since last visito No Patient Has Alerts: Yes Hospitalized since last visit: No Patient Alerts: Patient on Blood Thinner Has Dressing in Place as Prescribed: Yes Type II Diabetic Has Compression in Place as Prescribed: Yes 81mg  aspirin non compressible ABI 1/22 Pain Present Now: No Electronic Signature(s) Signed: 08/06/2021 10:19:34 AM By: 14/01/2021 RN Entered By: Yevonne Pax on 08/05/2021 14:00:53 14/12/2020 ([name]) -------------------------------------------------------------------------------- Clinic Level of Care Assessment Details Patient Name: [id number] Date of Service: 08/05/2021 2:00 PM Medical Record Number: 14/12/2020 Patient Account Number: [id number] Date of Birth/Sex: Jun 26, 1951 (70 y.o. M) Treating RN: (75 Primary Care Amberia Bayless: Yevonne Pax Other Clinician: Referring Raeanna Soberanes: Beverely Low Treating Carolee Channell/Extender: Beverely Low in Treatment: 13 Clinic Level of Care Assessment Items TOOL 1 Quantity Score []  -  Use when EandM and Procedure is performed on INITIAL visit 0 ASSESSMENTS - Nursing Assessment / Reassessment []  - General Physical Exam (combine w/ comprehensive assessment (listed just below) when performed on new 0 pt. evals) []  - 0 Comprehensive Assessment (HX, ROS, Risk Assessments, Wounds Hx, etc.) ASSESSMENTS - Wound and Skin Assessment / Reassessment []  - Dermatologic / Skin Assessment (not related to wound area) 0 ASSESSMENTS - Ostomy and/or Continence Assessment and Care []  - Incontinence Assessment and Management 0 []  - 0 Ostomy Care Assessment and Management (repouching, etc.) PROCESS - Coordination of Care []  - Simple Patient / Family Education for ongoing care 0 []  - 0 Complex (extensive) Patient / Family Education for ongoing care []  - 0 Staff obtains Rowan Blase, Records, Test Results / Process Orders []  - 0 Staff telephones HHA, Nursing Homes / Clarify orders / etc []  - 0 Routine Transfer to another Facility (non-emergent condition) []  - 0 Routine Hospital Admission (non-emergent condition) []  - 0 New Admissions / / Ordering NPWT, Apligraf, etc. []  - 0 Emergency Hospital Admission (emergent condition) PROCESS - Special Needs []  - Pediatric / Minor Patient Management 0 []  - 0 Isolation Patient Management []  - 0 Hearing / Language / Visual special needs []  - 0 Assessment of Community assistance (transportation, D/C planning, etc.) []  - 0 Additional assistance / Altered mentation []  - 0 Support Surface(s) Assessment (bed, cushion, seat, etc.) INTERVENTIONS - Miscellaneous []  - External ear exam 0 []  - 0 Patient Transfer (multiple staff / / Similar devices) []  - 0 Simple Staple / Suture removal (25 or less) []  - 0 Complex Staple / Suture removal (26 or more) []  - 0 Hypo/Hyperglycemic Management (do not check if billed separately) []  - 0 Ankle / Brachial Index (ABI) - do not check if billed separately Has the patient  been seen at the hospital within the last three years: Yes Total Score: 0 Level Of Care:  ____ [name] ([id number]) Electronic Signature(s) Signed: 08/06/2021 10:19:34 AM By: Yevonne Pax RN Entered By: Yevonne Pax on 08/05/2021 14:30:20 [name] ([id number]) -------------------------------------------------------------------------------- Compression Therapy Details Patient Name: [name] Date of Service: 08/05/2021 2:00 PM Medical Record Number: [id number] Patient Account Number: [id number] Date of Birth/Sex: 02/21/1951 (70 y.o. M) Treating RN: Yevonne Pax Primary Care Arleigh Odowd: Beverely Low Other Clinician: Referring Rozalyn Osland: Beverely Low Treating Delle Andrzejewski/Extender: Rowan Blase in Treatment: 13 Compression Therapy Performed for Wound Assessment: NonWound Condition Lymphedema - Right Leg Performed By: Clinician Yevonne Pax, RN Compression Type: Three Layer Post Procedure Diagnosis Same as Pre-procedure Electronic Signature(s) Signed: 08/06/2021 10:19:34 AM By: Yevonne Pax RN Entered By: Yevonne Pax on 08/05/2021 14:20:21 [name] ([id number]) -------------------------------------------------------------------------------- Encounter Discharge Information Details Patient Name: [name] Date of Service: 08/05/2021 2:00 PM Medical Record Number: [id number] Patient Account Number: [id number] Date of Birth/Sex: 05-Sep-1950 (70 y.o. M) Treating RN: Yevonne Pax Primary Care Lerry Cordrey: Beverely Low Other Clinician: Referring Armando Bukhari: Beverely Low Treating Gwenyth Dingee/Extender: Rowan Blase in Treatment: 13 Encounter Discharge Information Items Discharge Condition: Stable Ambulatory Status: Walker Discharge Destination: Home Transportation: Private Auto Accompanied By: self Schedule Follow-up Appointment: Yes Clinical Summary of Care: Patient Declined Electronic Signature(s) Signed: 08/06/2021 10:19:34 AM By: Yevonne Pax RN Entered By: Yevonne Pax on 08/05/2021 14:31:42 [name] ([id number]) -------------------------------------------------------------------------------- Lower Extremity Assessment Details Patient Name: [name] Date of Service: 08/05/2021 2:00 PM Medical Record Number: [id number] Patient Account Number: [id number] Date of Birth/Sex: [birth year] (70 y.o. M) Treating RN: Yevonne Pax Primary Care Baley Lorimer: Beverely Low Other Clinician: Referring Filemon Breton: Beverely Low Treating Hansini Clodfelter/Extender: Rowan Blase in Treatment: 13 Edema Assessment Assessed: [Left: No] [Right: No] Edema: [Left: Ye] [Right: s] Calf Left: Right: Point of Measurement: 31 cm From Medial Instep 42 cm Ankle Left: Right: Point of Measurement: 10 cm From Medial Instep 26 cm Vascular Assessment Pulses: Dorsalis Pedis Palpable: [Right:Yes] Electronic Signature(s) Signed: 08/06/2021 10:19:34 AM By: Yevonne Pax RN Entered By: Yevonne Pax on 08/05/2021 14:10:57 [name] ([id number]) -------------------------------------------------------------------------------- Multi Wound Chart Details Patient Name: [name] Date of Service: 08/05/2021 2:00 PM Medical Record Number: [id number] Patient Account Number: [id number] Date of Birth/Sex: 1951/08/13 (70 y.o. M) Treating RN: Yevonne Pax Primary Care Mazikeen Hehn: Beverely Low Other Clinician: Referring Keyshun Elpers: Beverely Low Treating Ashawnti Tangen/Extender: Rowan Blase in Treatment: 13 Vital Signs Height(in): 71 Pulse(bpm): 86 Weight(lbs): 255 Blood Pressure(mmHg): 146/69 Body Mass Index(BMI): 36 Temperature(F): 97.8 Respiratory Rate(breaths/min): 18 Wound Assessments Treatment Notes Electronic Signature(s) Signed: 08/06/2021 10:19:34 AM By: Yevonne Pax RN Entered By: Yevonne Pax on 08/05/2021 14:19:52 [name]  ([id number]) -------------------------------------------------------------------------------- Multi-Disciplinary Care Plan Details Patient Name: [name] Date of Service: 08/05/2021 2:00 PM Medical Record Number: [id number] Patient Account Number: [id number] Date of Birth/Sex: 1950/10/01 (70 y.o. M) Treating RN: Yevonne Pax Primary Care Abrial Arrighi: Beverely Low Other Clinician: Referring Korynne Dols: Beverely Low Treating Mayreli Alden/Extender: Rowan Blase in Treatment: 13 Active Inactive Electronic Signature(s) Signed: 08/06/2021 10:19:34 AM By: Yevonne Pax RN Entered By: Yevonne Pax on 08/05/2021 14:18:41 [name] ([id number]) -------------------------------------------------------------------------------- Pain Assessment Details Patient Name: [name] Date of Service: 08/05/2021 2:00 PM Medical Record Number: [id number] Patient Account Number: [id number] Date of Birth/Sex: 03-14-1951 (70 y.o. M) Treating RN: Yevonne Pax Primary Care Brinlee Gambrell: Beverely Low Other Clinician: Referring Tzipora Mcinroy: Beverely Low Treating Rishit Burkhalter/Extender: Rowan Blase in Treatment: 13 Active Problems Location of Pain Severity and Description of Pain Patient Has Paino No Site Locations Pain  Management and Medication Current Pain Management: Electronic Signature(s) Signed: 08/06/2021 10:19:34 AM By: Yevonne Pax RN Entered By: Yevonne Pax on 08/05/2021 14:01:27 [name] ([id number]) -------------------------------------------------------------------------------- Patient/Caregiver Education Details Patient Name: [name] Date of Service: 08/05/2021 2:00 PM Medical Record Number: [id number] Patient Account Number: [id number] Date of Birth/Gender: [birth year] (70 y.o. M) Treating RN: Yevonne Pax Primary Care Physician: Beverely Low Other Clinician: Referring Physician: Beverely Low Treating Physician/Extender: Rowan Blase in Treatment:  13 Education Assessment Education Provided To: Patient Education Topics Provided Wound/Skin Impairment: Methods: Explain/Verbal Responses: State content correctly Electronic Signature(s) Signed: 08/06/2021 10:19:34 AM By: Yevonne Pax RN Entered By: Yevonne Pax on 08/05/2021 14:30:52 [name] ([id number]) -------------------------------------------------------------------------------- Vitals Details Patient Name: [name] Date of Service: 08/05/2021 2:00 PM Medical Record Number: [id number] Patient Account Number: [id number] Date of Birth/Sex: [birth year] (70 y.o. M) Treating RN: Yevonne Pax Primary Care Elena Cothern: Beverely Low Other Clinician: Referring Kerwin Augustus: Beverely Low Treating Romelo Sciandra/Extender: Rowan Blase in Treatment: 13 Vital Signs Time Taken: 14:00 Temperature (F): 97.8 Height (in): 71 Pulse (bpm): 86 Weight (lbs): 255 Respiratory Rate (breaths/min): 18 Body Mass Index (BMI): 35.6 Blood Pressure (mmHg): 146/69 Reference Range: 80 - 120 mg / dl Electronic Signature(s) Signed: 08/06/2021 10:19:34 AM By: Yevonne Pax RN Entered By: Yevonne Pax on 08/05/2021 14:01:15

## 2021-08-12 ENCOUNTER — Other Ambulatory Visit: Payer: Self-pay

## 2021-08-12 DIAGNOSIS — E11621 Type 2 diabetes mellitus with foot ulcer: Secondary | ICD-10-CM | POA: Diagnosis not present

## 2021-08-12 NOTE — Progress Notes (Signed)
RIELY, BASKETT ([id number]) Visit Report for 08/12/2021 Arrival Information Details Patient Name: [name], [name] Date of Service: 08/12/2021 12:00 PM Medical Record Number: [id number] Patient Account Number: [id number] Date of Birth/Sex: 03/12/1951 (70 y.o. M) Treating RN: Angelina Pih Primary Care Tramane Gorum: Beverely Low Other Clinician: Huel Coventry Referring Glennie Rodda: Beverely Low Treating Danelle Curiale/Extender: Rowan Blase in Treatment: 14 Visit Information History Since Last Visit Pain Present Now: No Patient Arrived: Walker Arrival Time: 11:47 Accompanied By: self Transfer Assistance: None Patient Identification Verified: Yes Secondary Verification Process Yes Completed: Patient Has Alerts: Yes Patient Alerts: Patient on Blood Thinner Type II Diabetic 81mg  aspirin non compressible ABI 1/22 Electronic Signature(s) Signed: 08/12/2021 12:39:22 PM By: 14/08/2021, BSN, RN, CWS, Kim RN, BSN Previous Signature: 08/12/2021 12:11:30 PM Version By: 14/08/2021 Entered By: Angelina Pih BSN, RN, CWS, Kim on 08/12/2021 12:39:22 14/08/2021 ([name]) -------------------------------------------------------------------------------- Clinic Level of Care Assessment Details Patient Name: [id number] Date of Service: 08/12/2021 12:00 PM Medical Record Number: 14/08/2021 Patient Account Number: [id number] Date of Birth/Sex: 10-Jul-1951 (70 y.o. M) Treating RN: (75 Primary Care Mackensi Mahadeo: Angelina Pih Other Clinician: Beverely Low Referring Daneen Volcy: Huel Coventry Treating Ceejay Kegley/Extender: Beverely Low in Treatment: 14 Clinic Level of Care Assessment Items TOOL 1 Quantity Score []  - Use when EandM and Procedure is performed on INITIAL visit 0 ASSESSMENTS - Nursing Assessment / Reassessment []  - General Physical Exam (combine w/ comprehensive assessment (listed just below) when performed on new 0 pt. evals) []  - 0 Comprehensive Assessment (HX,  ROS, Risk Assessments, Wounds Hx, etc.) ASSESSMENTS - Wound and Skin Assessment / Reassessment []  - Dermatologic / Skin Assessment (not related to wound area) 0 ASSESSMENTS - Ostomy and/or Continence Assessment and Care []  - Incontinence Assessment and Management 0 []  - 0 Ostomy Care Assessment and Management (repouching, etc.) PROCESS - Coordination of Care []  - Simple Patient / Family Education for ongoing care 0 []  - 0 Complex (extensive) Patient / Family Education for ongoing care []  - 0 Staff obtains Rowan Blase, Records, Test Results / Process Orders []  - 0 Staff telephones HHA, Nursing Homes / Clarify orders / etc []  - 0 Routine Transfer to another Facility (non-emergent condition) []  - 0 Routine Hospital Admission (non-emergent condition) []  - 0 New Admissions / / Ordering NPWT, Apligraf, etc. []  - 0 Emergency Hospital Admission (emergent condition) PROCESS - Special Needs []  - Pediatric / Minor Patient Management 0 []  - 0 Isolation Patient Management []  - 0 Hearing / Language / Visual special needs []  - 0 Assessment of Community assistance (transportation, D/C planning, etc.) []  - 0 Additional assistance / Altered mentation []  - 0 Support Surface(s) Assessment (bed, cushion, seat, etc.) INTERVENTIONS - Miscellaneous []  - External ear exam 0 []  - 0 Patient Transfer (multiple staff / / Similar devices) []  - 0 Simple Staple / Suture removal (25 or less) []  - 0 Complex Staple / Suture removal (26 or more) []  - 0 Hypo/Hyperglycemic Management (do not check if billed separately) []  - 0 Ankle / Brachial Index (ABI) - do not check if billed separately Has the patient been seen at the hospital within the last three years: Yes Total Score: 0 Level Of Care: ____ ( ) Electronic Signature(s) Unsigned Entered By: on 08/12/2021 12:15:19 Signature(s): Date(s):  ( ) -------------------------------------------------------------------------------- Compression Therapy Details Patient Name: Date of Service: 08/12/2021 12:00 PM Medical Record Number: Patient Account Number: Date of Birth/Sex:  Sep 23, 1950 (70 y.o. M) Treating RN: Huel Coventry Primary Care Zhanae Proffit: Beverely Low Other Clinician: Huel Coventry Referring Kalyan Barabas: Beverely Low Treating Aedyn Kempfer/Extender: Rowan Blase in Treatment: 14 Compression Therapy Performed for Wound Assessment: NonWound Condition Lymphedema - Right Leg Performed By: Clinician Angelina Pih, RN Compression Type: Three Layer Notes right lower extremity Electronic Signature(s) Signed: 08/12/2021 12:40:29 PM By: Elliot Gurney, BSN, RN, CWS, Kim RN, BSN Previous Signature: 08/12/2021 12:12:09 PM Version By: Angelina Pih Entered By: Elliot Gurney BSN, RN, CWS, Kim on 08/12/2021 12:40:29 [name] ([id number]) -------------------------------------------------------------------------------- Encounter Discharge Information Details Patient Name: IZZAK, FRIES Date of Service: 08/12/2021 12:00 PM Medical Record Number: [id number] Patient Account Number: [id number] Date of Birth/Sex: 11-09-1950 (70 y.o. M) Treating RN: Angelina Pih Primary Care Adlynn Lowenstein: Beverely Low Other Clinician: Huel Coventry Referring Joannie Medine: Beverely Low Treating Brentley Landfair/Extender: Rowan Blase in Treatment: 14 Encounter Discharge Information Items Discharge Condition: Stable Ambulatory Status: Walker Discharge Destination: Home Transportation: Private Auto Accompanied By: self Schedule Follow-up Appointment: Yes Clinical Summary of Care: Patient Declined Electronic Signature(s) Signed: 08/12/2021 12:15:03 PM By: Angelina Pih Entered By: Angelina Pih on 08/12/2021 12:15:03

## 2021-08-14 NOTE — Progress Notes (Signed)
KORDE, JEPPSEN ([id number]) Visit Report for 08/12/2021 Physician Orders Details Patient Name: [name], [name]. Date of Service: 08/12/2021 12:00 PM Medical Record Number: [id number] Patient Account Number: [id number] Date of Birth/Sex: 03-02-1951 (70 y.o. M) Treating RN: Angelina Pih Primary Care Provider: Beverely Low Other Clinician: Huel Coventry Referring Provider: Beverely Low Treating Provider/Extender: Rowan Blase in Treatment: 43 Verbal / Phone Orders: No Diagnosis Coding Follow-up Appointments o Return Appointment in 1 week. o Nurse Visit as needed Bathing/ Shower/ Hygiene o May shower with wound dressing protected with water repellent cover or cast protector. o No tub bath. Edema Control - Lymphedema / Segmental Compressive Device / Other Right Lower Extremity o Optional: One layer of unna paste to top of compression wrap (to act as an anchor). o 3 Layer Compression System for Lymphedema. - right o Patient to wear own compression stockings. Remove compression stockings every night before going to bed and put on every morning when getting up. - Bring your compression socks to appt when they arrive in the mail o Elevate, Exercise Daily and Avoid Standing for Long Periods of Time. o Elevate legs to the level of the heart and pump ankles as often as possible o Elevate leg(s) parallel to the floor when sitting. o Compression Pump: Use compression pump on left lower extremity for 60 minutes, twice daily. o Compression Pump: Use compression pump on right lower extremity for 60 minutes, twice daily. Non-Wound Condition o Cleanse affected area with antibacterial soap and water, o Additional non-wound orders/instructions: - apply alginate and ABD for small drainage leaking area around newly healed skin Additional Orders / Instructions o Follow Nutritious Diet and Increase Protein Intake Electronic Signature(s) Signed: 08/13/2021 5:37:18  PM By: Lenda Kelp PA-C Signed: 08/14/2021 4:05:40 PM By: Angelina Pih Previous Signature: 08/12/2021 12:12:43 PM Version By: Angelina Pih Entered By: Angelina Pih on 08/12/2021 12:14:20 [name] ([id number]) -------------------------------------------------------------------------------- SuperBill Details Patient Name: [name] Date of Service: 08/12/2021 Medical Record Number: [id number] Patient Account Number: [id number] Date of Birth/Sex: 03-Feb-1951 (70 y.o. 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

## 2021-08-19 ENCOUNTER — Encounter: Payer: Medicare HMO | Admitting: Physician Assistant

## 2021-08-19 ENCOUNTER — Other Ambulatory Visit: Payer: Self-pay

## 2021-08-19 DIAGNOSIS — E11621 Type 2 diabetes mellitus with foot ulcer: Secondary | ICD-10-CM | POA: Diagnosis not present

## 2021-08-20 NOTE — Progress Notes (Addendum)
[name], [name] ([id number]) Visit Report for 08/19/2021 Chief Complaint Document Details Patient Name: [name], [name]. Date of Service: 08/19/2021 3:00 PM Medical Record Number: [id number] Patient Account Number: [id number] Date of Birth/Sex: 1950/10/24 (70 y.o. M) Treating RN: Angelina Pih Primary Care Provider: Beverely Low Other Clinician: Referring Provider: Beverely Low Treating Provider/Extender: Rowan Blase in Treatment: 15 Information Obtained from: Patient Chief Complaint Bilateral foot ulcers Electronic Signature(s) Signed: 08/19/2021 3:43:34 PM By: Lenda Kelp PA-C Entered By: Lenda Kelp on 08/19/2021 15:43:34 [name] ([id number]) -------------------------------------------------------------------------------- HPI Details Patient Name: [name] Date of Service: 08/19/2021 3:00 PM Medical Record Number: [id number] Patient Account Number: [id number] Date of Birth/Sex: March 10, 1951 (70 y.o. 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 Has been seen by [facility]iatry [facility] for a number of years it sounds like at least 4 years. With that being said they have tried a split thickness skin graft along with multiple other topical remedies over the time they have been seeing him over the past several years. Unfortunately they are not able to get this closed I did refer him to [place] for further evaluation and treatment. The patient tells me has been quite sometime since  he remembers having an x-ray and again we could not find anything recent when looking through his chart. Fortunately there does not appear to be any signs of active infection systemically which is great news. No fevers, chills, nausea, vomiting, or diarrhea. The patient does have diabetes with his most recent hemoglobin A1c being November 14, 2020 at 9.1. He also does have evidence of lymphedema based on what I am seeing today. 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 05/10/2021 upon evaluation today patient appears to be doing well with regard to his wounds in general. I feel like he is making progress. Fortunately the compression wrap did well for him and overall I am extremely happy with where we stand. No fevers, chills, nausea, vomiting, or diarrhea. 05/17/2021 upon evaluation today patient appears to be doing better in general regard to his wounds. Overall I feel like he is actually signs of good improvement which is great news and I think that we are headed in the right direction. There does not appear to be any signs of active infection at this time. No fevers, chills, nausea, vomiting, or diarrhea. 05/24/2021 upon evaluation today patient appears to be doing well with regard to his wounds. He is actually showing signs of improvement this is great news. Its mainly the right foot proximal wound that is going to require debridement today and again I do need to try to clear away some of the lip where he had some epiboly I then work on this week by week but if we get that cleared away I think this will heal much more effectively and quickly. 05/31/2021 upon evaluation today patient appears to be doing well at this point in regard to  his wounds. Both are showing signs of improvement and very pleased in that regard. Fortunately there does not appear  to be any evidence of active infection systemically which is great news. No fevers, chills, nausea, vomiting, or diarrhea. 06/07/2021 upon evaluation today patient appears to be making excellent progress in regard to his foot ulcers. Both are showing signs of excellent improvement. Fortunately there does not appear to be any evidence of active infection which is great news. No fevers, chills, nausea, vomiting, or diarrhea. 06/21/2021 upon evaluation today patient appears to be doing well with regard to his wound. In fact he is making excellent progress and I am extremely pleased with where things stand today. There does not appear to be any signs of active infection at this time. 06/28/2021 upon evaluation patient's foot ulcer actually is showing signs of good improvement. I am actually extremely pleased with how we stand today and I think that he does require some sharp debridement at this point but nothing in comparison what we have had to do before. Overall I think were headed in the appropriate direction he is very pleased to hear this. 07/05/2021 upon evaluation today patient appears to be doing well with regard to his foot ulcer. In fact this is showing signs of excellent improvement which is great news. No fevers, chills, nausea, vomiting, or diarrhea. 07/12/2021 upon evaluation today patient appears to be doing well with regard to his wound. He has been tolerating the dressing changes without complication. Fortunately this is showing signs of excellent improvement which is great news. No fevers, chills, nausea, vomiting, or diarrhea. 07/19/2021 upon evaluation today patient appears to be doing well with regard to his leg ulcer in fact this appears to be completely closed at this point. I do not see anything open but I do believe we will get a need to continue to wrap him to help with compression therapy. He needs compression socks and I do not think he is quite ready to put those on yet he  also tells me that he needs to order some. 07/29/2021 on evaluation today patient still appears to be doing well in fact everything still is closed although he did swell a bit over the past week. Fortunately there does not appear to be any signs of active infection at this time which is great news. 08/05/2021 upon evaluation today patient appears to be doing well with regard to his wound. In fact this still appears to be healed I do not see any signs of active infection at this time which is great news. No fevers, chills, nausea, vomiting, or diarrhea. 08/19/2021 upon evaluation today patient appears to be doing well with regard to his wound. This is actually looking better it still not really open however the area that is healed still continues to weep and drain a bit as well or continue to wrap him. I do feel like this region is actually toughen up though quite nicely over the past several weeks. Electronic Signature(s) JORRELL, KUSTER ([id number]) Signed: 08/19/2021 4:13:42 PM By: Lenda Kelp PA-C Entered By: Lenda Kelp on 08/19/2021 16:13:41 [name] ([id number]) -------------------------------------------------------------------------------- Physical Exam Details Patient Name: [name], [name] Date of Service: 08/19/2021 3:00 PM Medical Record Number: [id number] Patient Account Number: [id number] Date of Birth/Sex: [birth year] (70 y.o. M) Treating RN: Angelina Pih Primary Care Provider: Beverely Low Other Clinician: Referring Provider: Beverely Low Treating Provider/Extender: Rowan Blase in Treatment: 15 Constitutional Well-nourished and well-hydrated in no acute distress. Respiratory  normal breathing without difficulty. Psychiatric this patient is able to make decisions and demonstrates good insight into disease process. Alert and Oriented x 3. pleasant and cooperative. Notes Upon inspection patient's wound bed actually showed signs of good  granulation and epithelization at this point. Fortunately there does not appear to be any signs of active infection which is great news and overall I am extremely pleased with where we stand today. Fortunately there does not appear to be any signs of infection and overall I think were doing quite well in general. Electronic Signature(s) Signed: 08/19/2021 4:15:45 PM By: Lenda Kelp PA-C Entered By: Lenda Kelp on 08/19/2021 16:15:44 [name] ([id number]) -------------------------------------------------------------------------------- Physician Orders Details Patient Name: [name] Date of Service: 08/19/2021 3:00 PM Medical Record Number: [id number] Patient Account Number: [id number] Date of Birth/Sex: 1951-05-21 (70 y.o. 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 o Nurse Visit as needed - nurse visit next week on Thursday Bathing/ Shower/ Hygiene o May shower with wound dressing protected with water repellent cover or cast protector. o No tub bath. Edema Control - Lymphedema / Segmental Compressive Device / Other Right Lower Extremity o Optional: One layer of unna paste to top of compression wrap (to act as an anchor). o 3 Layer Compression System for Lymphedema. -  right o Patient to wear own compression stockings. Remove compression stockings every night before going to bed and put on every morning when getting up. - Bring your compression socks to appt when they arrive in the mail o Elevate, Exercise Daily and Avoid Standing for Long Periods of Time. o Elevate legs to the level of the heart and pump ankles as often as possible o Elevate leg(s) parallel to the floor when sitting. o Compression Pump: Use compression pump on left lower extremity for 60 minutes, twice daily. o Compression Pump: Use compression pump on right lower extremity for 60 minutes, twice daily. Non-Wound Condition o Cleanse affected area with antibacterial soap and water, o Additional non-wound orders/instructions: - apply alginate and ABD for small drainage leaking area around newly healed skin Additional Orders / Instructions o Follow Nutritious Diet and Increase Protein Intake Electronic Signature(s) Signed: 08/19/2021 5:42:10 PM By: Lenda Kelp PA-C Signed: 08/21/2021 12:56:33 PM By: Angelina Pih Entered By: Angelina Pih on 08/19/2021 15:58:26 [name] ([id number]) -------------------------------------------------------------------------------- Problem List Details Patient Name: [name] Date of Service: 08/19/2021 3:00 PM Medical Record Number: [id number] Patient Account Number: [id number] Date of Birth/Sex: 07-25-1951 (70 y.o. M) Treating RN: Angelina Pih Primary Care Provider: Beverely Low Other Clinician: Referring Provider: Beverely Low Treating Provider/Extender: Rowan Blase in Treatment: 15 Active Problems ICD-10 Encounter Code Description Active Date MDM Diagnosis E11.621 Type 2 diabetes mellitus with foot ulcer 05/03/2021 No Yes L97.512 Non-pressure chronic ulcer of other part of right foot with fat layer 05/03/2021 No Yes exposed L97.522 Non-pressure chronic ulcer of other part of left foot with  fat  layer 05/03/2021 No Yes exposed Z89.421 Acquired absence of other right toe(s) 05/03/2021 No Yes I10 Essential (primary) hypertension 05/03/2021 No Yes Z79.01 Long term (current) use of anticoagulants 05/03/2021 No Yes Z86.718 Personal history of other venous thrombosis and embolism 05/03/2021 No Yes Inactive Problems Resolved Problems Electronic Signature(s) Signed: 08/19/2021 3:43:26 PM By: Lenda Kelp PA-C Entered By: Lenda Kelp on 08/19/2021 15:43:26 [name] ([id number]) -------------------------------------------------------------------------------- Progress Note Details Patient Name: [name] Date of Service: 08/19/2021 3:00 PM Medical Record Number: [id number] Patient Account Number: [id number] Date of Birth/Sex: [birth year] (70 y.o. 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 Has been seen by [facility]ry [facility] for a number of years it sounds like at least 4 years. With that being said they have tried a split thickness skin graft along with multiple other topical remedies over the time they have been seeing him over the past several years. Unfortunately they are not able to get this closed I did refer him to [place] for further evaluation and treatment. The patient tells me has been quite sometime since he remembers having an x-ray and again we could not find anything recent when looking through his chart. Fortunately  there does not appear to be any signs of active infection systemically which is great news. No fevers, chills, nausea, vomiting, or diarrhea. The patient does have diabetes with his most recent hemoglobin A1c being November 14, 2020 at 9.1. He also does have evidence of lymphedema based on what I am seeing today. 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 05/10/2021 upon evaluation today patient appears to be doing well with regard to his wounds in general. I feel like he is making progress. Fortunately the compression wrap did well for him and overall I am extremely happy with where we stand. No fevers, chills, nausea, vomiting, or diarrhea. 05/17/2021 upon evaluation today patient appears to be doing better in general regard to his wounds. Overall I feel like he is actually signs of good improvement which is great news and I think that we are headed in the right direction. There does not appear to be any signs of active infection at this time. No fevers, chills, nausea, vomiting, or diarrhea. 05/24/2021 upon evaluation today patient appears to be doing well with regard to his wounds. He is actually showing signs of improvement this is great news. Its mainly the right foot proximal wound that is going to require debridement today and again I do need to try to clear away some of the lip where he had some epiboly I then work on this week by week but if we get that cleared away I think this will heal much more effectively and quickly. 05/31/2021 upon evaluation today patient appears to be doing well at this point in regard to his wounds. Both are showing signs of improvement and very  pleased in that regard. Fortunately there does not appear to be any evidence of active infection systemically which is great news. No fevers, chills, nausea, vomiting, or  diarrhea. 06/07/2021 upon evaluation today patient appears to be making excellent progress in regard to his foot ulcers. Both are showing signs of excellent improvement. Fortunately there does not appear to be any evidence of active infection which is great news. No fevers, chills, nausea, vomiting, or diarrhea. 06/21/2021 upon evaluation today patient appears to be doing well with regard to his wound. In fact he is making excellent progress and I am extremely pleased with where things stand today. There does not appear to be any signs of active infection at this time. 06/28/2021 upon evaluation patient's foot ulcer actually is showing signs of good improvement. I am actually extremely pleased with how we stand today and I think that he does require some sharp debridement at this point but nothing in comparison what we have had to do before. Overall I think were headed in the appropriate direction he is very pleased to hear this. 07/05/2021 upon evaluation today patient appears to be doing well with regard to his foot ulcer. In fact this is showing signs of excellent improvement which is great news. No fevers, chills, nausea, vomiting, or diarrhea. 07/12/2021 upon evaluation today patient appears to be doing well with regard to his wound. He has been tolerating the dressing changes without complication. Fortunately this is showing signs of excellent improvement which is great news. No fevers, chills, nausea, vomiting, or diarrhea. 07/19/2021 upon evaluation today patient appears to be doing well with regard to his leg ulcer in fact this appears to be completely closed at this point. I do not see anything open but I do believe we will get a need to continue to wrap him to help with compression therapy. He needs compression socks and I do not think he is quite ready to put those on yet he also tells me that he needs to order some. 07/29/2021 on evaluation today patient still appears to be doing well in  fact everything still is closed although he did swell a bit over the past week. Fortunately there does not appear to be any signs of active infection at this time which is great news. 08/05/2021 upon evaluation today patient appears to be doing well with regard to his wound. In fact this still appears to be healed I do not see any signs of active infection at this time which is great news. No fevers, chills, nausea, vomiting, or diarrhea. 08/19/2021 upon evaluation today patient appears to be doing well with regard to his wound. This is actually looking better it still not really open however the area that is healed still continues to weep and drain a bit as well or continue to wrap him. I do feel like this region is actually toughen up though quite nicely over the past several weeks. [name], [name] ([id number]) Objective Constitutional Well-nourished and well-hydrated in no acute distress. Vitals Time Taken: 3:28 PM, Height: 71 in, Weight: 255 lbs, BMI: 35.6, Temperature: 97.5 F, Pulse: 75 bpm, Respiratory Rate: 18 breaths/min, Blood Pressure: 136/70 mmHg. Respiratory normal breathing without difficulty. Psychiatric this patient is able to make decisions and demonstrates good insight into disease process. Alert and Oriented x 3. pleasant and cooperative. General Notes: Upon inspection patient's wound bed actually showed signs of good granulation and epithelization at this point. Fortunately there does not appear to be any signs of active infection which  is great news and overall I am extremely pleased with where we stand today. Fortunately there does not appear to be any signs of infection and overall I think were doing quite well in general. Other Condition(s) Patient presents with Lymphedema located on the Right Leg. 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 Post procedure Diagnosis Wound #: Same as Pre-Procedure Notes: RLE. Plan Follow-up Appointments: Return Appointment in 1 week. Nurse Visit as needed - nurse visit next week on Thursday Bathing/ Shower/ Hygiene: May shower with wound dressing protected with water repellent cover or cast protector. No tub bath. Edema Control - Lymphedema / Segmental Compressive Device / Other: Optional: One layer of unna paste to top of compression wrap (to act as an anchor). [name], [name] ([id number]) 3 Layer Compression System for Lymphedema. - right Patient to wear own compression stockings. Remove compression stockings every night before going to bed and put on every morning when getting up. - Bring your compression socks to appt when they arrive in the mail Elevate, Exercise Daily and Avoid Standing for Long Periods of Time. Elevate legs to the level of the heart and pump ankles as often as possible Elevate leg(s) parallel to the floor when sitting. Compression Pump: Use compression pump on left lower extremity for 60 minutes, twice daily. Compression Pump: Use compression pump on right lower extremity for 60 minutes, twice daily. Non-Wound Condition: Cleanse affected area with antibacterial soap and water, Additional non-wound orders/instructions: - apply alginate and ABD for small drainage leaking area around newly healed skin Additional Orders / Instructions: Follow Nutritious Diet and Increase Protein Intake 1. Would  recommend currently that we going to continue with the wound care measures as before and the patient is in agreement with the plan. This includes the use of the silver alginate dressing just to keep the area quite dry. We will also use an ABD pad over top of this then a 3 layer compression wrap. 2. We will continue to monitor for any signs of worsening he will continue to use his compression pumps as well. My hope is we will get this completely softened up over the next several weeks. We will see patient back for reevaluation in 2 weeks here in the clinic. If anything worsens or changes patient will contact our office for additional recommendations. We will see him in 1 week for nurse visit. Electronic Signature(s) Signed: 08/19/2021 4:16:42 PM By: Lenda Kelp PA-C Entered By: Lenda Kelp on 08/19/2021 16:16:42 [name] ([id number]) -------------------------------------------------------------------------------- SuperBill Details Patient Name: [name] Date of Service: 08/19/2021 Medical Record Number: [id number] Patient Account Number: [id number] Date of Birth/Sex: 06/05/1951 (70 y.o. M) Treating RN: Angelina Pih Primary Care Provider: Beverely Low Other Clinician: Referring Provider: Beverely Low Treating Provider/Extender: Rowan Blase in Treatment: 15 Diagnosis Coding ICD-10 Codes Code Description E11.621 Type 2 diabetes mellitus with foot ulcer L97.512 Non-pressure chronic ulcer of other part of right foot with fat  layer exposed L97.522 Non-pressure chronic ulcer of other part of left foot with fat layer exposed Z89.421 Acquired absence of other right toe(s) I10 Essential (primary) hypertension Z79.01 Long term (current) use of anticoagulants Z86.718 Personal history of other venous thrombosis and embolism Facility Procedures CPT4 Code: 16109604 Description: (Facility Use Only) [phone] - APPLY MULTLAY COMPRS LWR RT LEG Modifier: Quantity:  1 Physician Procedures CPT4 Code: [id number] Description: 99213 - WC PHYS LEVEL 3 - EST PT Modifier: Quantity: 1 CPT4 Code: Description: ICD-10 Diagnosis Description E11.621 Type 2 diabetes mellitus with foot ulcer L97.512 Non-pressure chronic ulcer of other part of right foot with fat layer e L97.522 Non-pressure chronic ulcer of other part of left foot with fat layer ex  Z89.421 Acquired absence of other right toe(s) Modifier: xposed posed Quantity: Electronic Signature(s) Signed: 08/19/2021 4:21:36 PM By: Lenda Kelp PA-C Entered By: Lenda Kelp on 08/19/2021 16:21:36

## 2021-08-21 NOTE — Progress Notes (Signed)
AYAZ, SONDGEROTH ([id number]) Visit Report for 08/19/2021 Arrival Information Details Patient Name: [name], [name] Date of Service: 08/19/2021 3:00 PM Medical Record Number: [id number] Patient Account Number: [id number] Date of Birth/Sex: 16-Mar-1951 (70 y.o. M) Treating RN: Angelina Pih Primary Care Shelia Kingsberry: Beverely Low Other Clinician: Referring Tyria Springer: Beverely Low Treating Aydenn Gervin/Extender: Rowan Blase in Treatment: 15 Visit Information History Since Last Visit Added or deleted any medications: No Patient Arrived: Dan Humphreys Any new allergies or adverse reactions: No Arrival Time: 15:28 Had a fall or experienced change in No Accompanied By: self activities of daily living that may affect Transfer Assistance: None risk of falls: Patient Identification Verified: Yes Hospitalized since last visit: No Secondary Verification Process Completed: Yes Has Dressing in Place as Prescribed: Yes Patient Has Alerts: Yes Has Compression in Place as Prescribed: Yes Patient Alerts: Patient on Blood Thinner Pain Present Now: No Type II Diabetic 81mg  aspirin non compressible ABI 1/22 Electronic Signature(s) Signed: 08/21/2021 12:56:33 PM By: 08/23/2021 Entered By: Angelina Pih on 08/19/2021 15:28:47 08/21/2021 ([name]) -------------------------------------------------------------------------------- Clinic Level of Care Assessment Details Patient Name: [id number] Date of Service: 08/19/2021 3:00 PM Medical Record Number: 08/21/2021 Patient Account Number: [id number] Date of Birth/Sex: 13-Mar-1951 (70 y.o. M) Treating RN: (75 Primary Care Gedeon Brandow: Angelina Pih Other Clinician: Referring Rolland Steinert: Beverely Low Treating Renarda Mullinix/Extender: Beverely Low in Treatment: 15 Clinic Level of Care Assessment Items TOOL 4 Quantity Score []  - Use when only an EandM is performed on FOLLOW-UP visit 0 ASSESSMENTS - Nursing Assessment /  Reassessment []  - Reassessment of Co-morbidities (includes updates in patient status) 0 []  - 0 Reassessment of Adherence to Treatment Plan ASSESSMENTS - Wound and Skin Assessment / Reassessment []  - Simple Wound Assessment / Reassessment - one wound 0 []  - 0 Complex Wound Assessment / Reassessment - multiple wounds []  - 0 Dermatologic / Skin Assessment (not related to wound area) ASSESSMENTS - Focused Assessment []  - Circumferential Edema Measurements - multi extremities 0 []  - 0 Nutritional Assessment / Counseling / Intervention []  - 0 Lower Extremity Assessment (monofilament, tuning fork, pulses) []  - 0 Peripheral Arterial Disease Assessment (using hand held doppler) ASSESSMENTS - Ostomy and/or Continence Assessment and Care []  - Incontinence Assessment and Management 0 []  - 0 Ostomy Care Assessment and Management (repouching, etc.) PROCESS - Coordination of Care []  - Simple Patient / Family Education for ongoing care 0 []  - 0 Complex (extensive) Patient / Family Education for ongoing care []  - 0 Staff obtains Rowan Blase, Records, Test Results / Process Orders []  - 0 Staff telephones HHA, Nursing Homes / Clarify orders / etc []  - 0 Routine Transfer to another Facility (non-emergent condition) []  - 0 Routine Hospital Admission (non-emergent condition) []  - 0 New Admissions / / Ordering NPWT, Apligraf, etc. []  - 0 Emergency Hospital Admission (emergent condition) []  - 0 Simple Discharge Coordination []  - 0 Complex (extensive) Discharge Coordination PROCESS - Special Needs []  - Pediatric / Minor Patient Management 0 []  - 0 Isolation Patient Management []  - 0 Hearing / Language / Visual special needs []  - 0 Assessment of Community assistance (transportation, D/C planning, etc.) []  - 0 Additional assistance / Altered mentation []  - 0 Support Surface(s) Assessment (bed, cushion, seat, etc.) INTERVENTIONS - Wound Cleansing /  Measurement SHMIEL, MORTON ( ) []  - 0 Simple Wound Cleansing - one wound []  - 0 Complex Wound Cleansing - multiple wounds []  - 0 Wound Imaging (photographs - any number of wounds) []  -  0 Wound Tracing (instead of photographs) []  - 0 Simple Wound Measurement - one wound []  - 0 Complex Wound Measurement - multiple wounds INTERVENTIONS - Wound Dressings []  - Small Wound Dressing one or multiple wounds 0 []  - 0 Medium Wound Dressing one or multiple wounds []  - 0 Large Wound Dressing one or multiple wounds []  - 0 Application of Medications - topical []  - 0 Application of Medications - injection INTERVENTIONS - Miscellaneous []  - External ear exam 0 []  - 0 Specimen Collection (cultures, biopsies, blood, body fluids, etc.) []  - 0 Specimen(s) / Culture(s) sent or taken to Lab for analysis []  - 0 Patient Transfer (multiple staff / / Similar devices) []  - 0 Simple Staple / Suture removal (25 or less) []  - 0 Complex Staple / Suture removal (26 or more) []  - 0 Hypo / Hyperglycemic Management (close monitor of Blood Glucose) []  - 0 Ankle / Brachial Index (ABI) - do not check if billed separately []  - 0 Vital Signs Has the patient been seen at the hospital within the last three years: Yes Total Score: 0 Level Of Care: ____ Electronic Signature(s) Signed: 08/21/2021 12:56:33 PM By: Entered By: on 08/19/2021 16:08:13 ( ) -------------------------------------------------------------------------------- Compression Therapy Details Patient Name: Date of Service: 08/19/2021 3:00 PM Medical Record Number: Patient Account Number: Date of Birth/Sex: 1951/05/05 (70 y.o. M) Treating RN: Primary Care Giordana Weinheimer: Other Clinician: Referring Yamilee Harmes: Treating Gissel Keilman/Extender: in Treatment: 15 Compression  Therapy Performed for Wound Assessment: NonWound Condition Lymphedema - Right Leg Performed By: Clinician 08/23/2021, RN Compression Type: Three Layer Post Procedure Diagnosis Same as Pre-procedure Notes RLE Electronic Signature(s) Signed: 08/21/2021 12:56:33 PM By: Angelina Pih Entered By: 08/21/2021 on 08/19/2021 16:07:53 [id number] ([name]) -------------------------------------------------------------------------------- Encounter Discharge Information Details Patient Name: 08/21/2021 Date of Service: 08/19/2021 3:00 PM Medical Record Number: [id number] Patient Account Number: 06/16/1951 Date of Birth/Sex: 05/25/1951 (70 y.o. M) Treating RN: Beverely Low Primary Care Briah Nary: Beverely Low Other Clinician: Referring Oris Staffieri: Rowan Blase Treating Anahi Belmar/Extender: Angelina Pih in Treatment: 15 Encounter Discharge Information Items Discharge Condition: Stable Ambulatory Status: Walker Discharge Destination: Home Transportation: Private Auto Accompanied By: self Schedule Follow-up Appointment: Yes Clinical Summary of Care: Electronic Signature(s) Signed: 08/21/2021 12:56:33 PM By: Angelina Pih Entered By: Angelina Pih on 08/19/2021 16:10:05 [name] ([id number]) -------------------------------------------------------------------------------- Lower Extremity Assessment Details Patient Name: [name] Date of Service: 08/19/2021 3:00 PM Medical Record Number: [id number] Patient Account Number: [id number] Date of Birth/Sex: 01/29/1951 (70 y.o. M) Treating RN: Angelina Pih Primary Care Kyarah Enamorado: Beverely Low Other Clinician: Referring Kaaren Nass: Beverely Low Treating Krisy Dix/Extender: Rowan Blase in Treatment: 15 Edema Assessment Assessed: [Left: No] [Right: No] Edema: [Left: Ye] [Right: s] Calf Left: Right: Point of Measurement: 31 cm From Medial Instep 43.5 cm Ankle Left: Right: Point of  Measurement: 10 cm From Medial Instep 27 cm Vascular Assessment Pulses: Dorsalis Pedis Palpable: [Right:Yes] Electronic Signature(s) Signed: 08/21/2021 12:56:33 PM By: Angelina Pih Entered By: Angelina Pih on 08/19/2021 15:44:00 [name] ([id number]) -------------------------------------------------------------------------------- Multi Wound Chart Details Patient Name: [name] Date of Service: 08/19/2021 3:00 PM Medical Record Number: [id number] Patient Account Number: [id number] Date of Birth/Sex: 13-Mar-1951 (70 y.o. M) Treating RN: Angelina Pih Primary Care Clotilde Loth: Beverely Low Other Clinician: Referring Deonta Bomberger: Beverely Low Treating Aryannah Mohon/Extender: Rowan Blase in Treatment: 15 Vital Signs Height(in): 71 Pulse(bpm): 75  Weight(lbs): 255 Blood Pressure(mmHg): 136/70 Body Mass Index(BMI): 36 Temperature(F): 97.5 Respiratory Rate(breaths/min): 18 Wound Assessments Treatment Notes Electronic Signature(s) Signed: 08/21/2021 12:56:33 PM By: Angelina Pih Entered By: Angelina Pih on 08/19/2021 15:53:13 [name] ([id number]) -------------------------------------------------------------------------------- Multi-Disciplinary Care Plan Details Patient Name: [name] Date of Service: 08/19/2021 3:00 PM Medical Record Number: [id number] Patient Account Number: [id number] Date of Birth/Sex: 1951/02/14 (70 y.o. M) Treating RN: Angelina Pih Primary Care Antinio Sanderfer: Beverely Low Other Clinician: Referring Kahleb Mcclane: Beverely Low Treating Laysa Kimmey/Extender: Rowan Blase in Treatment: 15 Active Inactive Electronic Signature(s) Signed: 08/21/2021 12:56:33 PM By: Angelina Pih Entered By: Angelina Pih on 08/19/2021 15:52:51 [name] ([id number]) -------------------------------------------------------------------------------- Non-Wound Condition Assessment Details Patient Name: [name] Date of Service: 08/19/2021 3:00 PM Medical Record Number: [id number] Patient Account Number: [id number] Date of Birth/Sex: 11-09-1950 (70 y.o. M) Treating RN: Angelina Pih Primary Care Swannie Milius: Beverely Low Other Clinician: Referring Starling Jessie: Beverely Low Treating Jamirah Zelaya/Extender: Rowan Blase in Treatment: 15 Non-Wound Condition: Condition: Lymphedema Location: Leg Side: Right Photos Notes right foot lateral small area noted measurements 0.5 x 0.3 x 0.2, per PA Stone area is closed but a weak area where drainage is Electronic Signature(s) Signed: 08/21/2021 12:56:33 PM By: Angelina Pih Entered By: Angelina Pih on 08/19/2021 15:52:29 [name] ([id number]) -------------------------------------------------------------------------------- Pain Assessment Details Patient Name: [name] Date of Service: 08/19/2021 3:00 PM Medical Record Number: [id number] Patient Account Number: [id number] Date of Birth/Sex: 1951-01-19 (70 y.o. M) Treating RN: Angelina Pih Primary Care Keric Zehren: Beverely Low Other Clinician: Referring Venson Ferencz: Beverely Low Treating Longino Trefz/Extender: Rowan Blase in Treatment: 15 Active Problems Location of Pain Severity and Description of Pain Patient Has Paino No Site Locations Rate the pain. Current Pain Level: 0 Pain Management and Medication Current Pain Management: Electronic Signature(s) Signed: 08/21/2021 12:56:33 PM By: Angelina Pih Entered By: Angelina Pih on 08/19/2021 15:31:49 [name] ([id number]) -------------------------------------------------------------------------------- Patient/Caregiver Education Details Patient Name: [name] Date of Service: 08/19/2021 3:00 PM Medical Record Number: [id number] Patient Account Number: [id number] Date of Birth/Gender: 12-09-1950 (70 y.o. M) Treating RN: Angelina Pih Primary Care Physician: Beverely Low Other  Clinician: Referring Physician: Beverely Low Treating Physician/Extender: Rowan Blase in Treatment: 15 Education Assessment Education Provided To: Patient Education Topics Provided Wound/Skin Impairment: Handouts: Caring for Your Ulcer Methods: Explain/Verbal Responses: State content correctly Electronic Signature(s) Signed: 08/21/2021 12:56:33 PM By: Angelina Pih Entered By: Angelina Pih on 08/19/2021 16:08:44 [name] ([id number]) -------------------------------------------------------------------------------- Vitals Details Patient Name: [name] Date of Service: 08/19/2021 3:00 PM Medical Record Number: [id number] Patient Account Number: [id number] Date of Birth/Sex: 05-Dec-1950 (70 y.o. M) Treating RN: Angelina Pih Primary Care Melondy Blanchard: Beverely Low Other Clinician: Referring Chantae Soo: Beverely Low Treating Jamario Colina/Extender: Rowan Blase in Treatment: 15 Vital Signs Time Taken: 15:28 Temperature (F): 97.5 Height (in): 71 Pulse (bpm): 75 Weight (lbs): 255 Respiratory Rate (breaths/min): 18 Body Mass Index (BMI): 35.6 Blood Pressure (mmHg): 136/70 Reference Range: 80 - 120 mg / dl Electronic Signature(s) Signed: 08/21/2021 12:56:33 PM By: Angelina Pih Entered By: Angelina Pih on 08/19/2021 15:31:07

## 2021-08-29 ENCOUNTER — Other Ambulatory Visit: Payer: Self-pay

## 2021-08-29 DIAGNOSIS — E11621 Type 2 diabetes mellitus with foot ulcer: Secondary | ICD-10-CM | POA: Diagnosis not present

## 2021-08-29 NOTE — Progress Notes (Signed)
[name] ([id number]) Visit Report for 08/29/2021 Arrival Information Details Patient Name: [name], [name] Date of Service: 08/29/2021 9:00 AM Medical Record Number: [id number] Patient Account Number: [id number] Date of Birth/Sex: March 10, 1951 (70 y.o. M) Treating RN: Angelina Pih Primary Care Raylee Strehl: Beverely Low Other Clinician: Referring Ryeleigh Santore: Beverely Low Treating Baby Gieger/Extender: Altamese [place] in Treatment: 16 Visit Information History Since Last Visit Added or deleted any medications: No Patient Arrived: Walker Any new allergies or adverse reactions: No Arrival Time: 08:56 Had a fall or experienced change in No Accompanied By: self activities of daily living that may affect Transfer Assistance: None risk of falls: Patient Identification Verified: Yes Hospitalized since last visit: No Secondary Verification Process Yes Has Dressing in Place as Prescribed: Yes Completed: Has Compression in Place as Prescribed: Yes Patient Has Alerts: Yes Pain Present Now: No Patient Alerts: Patient on Blood Thinner Type II Diabetic 81mg  aspirin non compressible ABI 1/22 Electronic Signature(s) Signed: 08/29/2021 10:53:54 AM By: 08/31/2021 Entered By: Angelina Pih on 08/29/2021 09:19:45 08/31/2021 ([name]) -------------------------------------------------------------------------------- Clinic Level of Care Assessment Details Patient Name: [id number] Date of Service: 08/29/2021 9:00 AM Medical Record Number: 08/31/2021 Patient Account Number: [id number] Date of Birth/Sex: [birth year] (70 y.o. M) Treating RN: (75 Primary Care Camdynn Maranto: Angelina Pih Other Clinician: Referring Jakirah Zaun: Beverely Low Treating Janice Seales/Extender: Beverely Low in Treatment: 16 Clinic Level of Care Assessment Items TOOL 1 Quantity Score []  - Use when EandM and Procedure is performed on INITIAL visit 0 ASSESSMENTS -  Nursing Assessment / Reassessment []  - General Physical Exam (combine w/ comprehensive assessment (listed just below) when performed on new 0 pt. evals) []  - 0 Comprehensive Assessment (HX, ROS, Risk Assessments, Wounds Hx, etc.) ASSESSMENTS - Wound and Skin Assessment / Reassessment []  - Dermatologic / Skin Assessment (not related to wound area) 0 ASSESSMENTS - Ostomy and/or Continence Assessment and Care []  - Incontinence Assessment and Management 0 []  - 0 Ostomy Care Assessment and Management (repouching, etc.) PROCESS - Coordination of Care []  - Simple Patient / Family Education for ongoing care 0 []  - 0 Complex (extensive) Patient / Family Education for ongoing care []  - 0 Staff obtains [facility], Records, Test Results / Process Orders []  - 0 Staff telephones HHA, Nursing Homes / Clarify orders / etc []  - 0 Routine Transfer to another Facility (non-emergent condition) []  - 0 Routine Hospital Admission (non-emergent condition) []  - 0 New Admissions / / Ordering NPWT, Apligraf, etc. []  - 0 Emergency Hospital Admission (emergent condition) PROCESS - Special Needs []  - Pediatric / Minor Patient Management 0 []  - 0 Isolation Patient Management []  - 0 Hearing / Language / Visual special needs []  - 0 Assessment of Community assistance (transportation, D/C planning, etc.) []  - 0 Additional assistance / Altered mentation []  - 0 Support Surface(s) Assessment (bed, cushion, seat, etc.) INTERVENTIONS - Miscellaneous []  - External ear exam 0 []  - 0 Patient Transfer (multiple staff / / Similar devices) []  - 0 Simple Staple / Suture removal (25 or less) []  - 0 Complex Staple / Suture removal (26 or more) []  - 0 Hypo/Hyperglycemic Management (do not check if billed separately) []  - 0 Ankle / Brachial Index (ABI) - do not check if billed separately Has the patient been seen at the hospital within the last three years: Yes Total Score:  0 Level Of Care: ____ ( ) Electronic Signature(s) Signed: 08/29/2021 10:53:54 AM By: Entered By:  Angelina Pih on 08/29/2021 09:21:47 [name] ([id number]) -------------------------------------------------------------------------------- Encounter Discharge Information Details Patient Name: [name] Date of Service: 08/29/2021 9:00 AM Medical Record Number: [id number] Patient Account Number: [id number] Date of Birth/Sex: August 11, 1951 (70 y.o. M) Treating RN: Angelina Pih Primary Care Dajanique Robley: Beverely Low Other Clinician: Referring Mariya Mottley: Beverely Low Treating Ludia Gartland/Extender: Altamese [place] in Treatment: 24 Encounter Discharge Information Items Discharge Condition: Stable Ambulatory Status: Walker Discharge Destination: Home Transportation: Private Auto Accompanied By: self Schedule Follow-up Appointment: Yes Clinical Summary of Care: Electronic Signature(s) Signed: 08/29/2021 10:53:54 AM By: Angelina Pih Entered By: Angelina Pih on 08/29/2021 09:21:38 [name] ([id number]) -------------------------------------------------------------------------------- Non-Wound Condition Assessment Details Patient Name: [name] Date of Service: 08/29/2021 9:00 AM Medical Record Number: [id number] Patient Account Number: [id number] Date of Birth/Sex: 1950-12-07 (70 y.o. M) Treating RN: Angelina Pih Primary Care Rafia Shedden: Beverely Low Other Clinician: Referring Devetta Hagenow: Beverely Low Treating Tatiyana Foucher/Extender: Altamese [place] in Treatment: 16 Non-Wound Condition: Condition: Lymphedema Location: Leg Side: Right Notes 3 layer wrap applied to right lower extremity per orders. Patient tolerated Electronic Signature(s) Signed: 08/29/2021 10:53:54 AM By: Angelina Pih Entered By: Angelina Pih on 08/29/2021 09:19:30

## 2021-08-30 NOTE — Progress Notes (Signed)
TAELOR, MONCADA ([id number]) Visit Report for 08/29/2021 Physician Orders Details Patient Name: [name], [name]. Date of Service: 08/29/2021 9:00 AM Medical Record Number: [id number] Patient Account Number: [id number] Date of Birth/Sex: 01-17-1951 (70 y.o. M) Treating RN: Angelina Pih Primary Care Provider: Beverely Low Other Clinician: Referring Provider: Beverely Low Treating Provider/Extender: Altamese [place] in Treatment: 100 Verbal / Phone Orders: No Diagnosis Coding Follow-up Appointments o Return Appointment in 1 week. o Nurse Visit as needed Bathing/ Shower/ Hygiene o May shower with wound dressing protected with water repellent cover or cast protector. o No tub bath. Edema Control - Lymphedema / Segmental Compressive Device / Other Right Lower Extremity o Optional: One layer of unna paste to top of compression wrap (to act as an anchor). o 3 Layer Compression System for Lymphedema. - right o Patient to wear own compression stockings. Remove compression stockings every night before going to bed and put on every morning when getting up. - Bring your compression socks to appt when they arrive in the mail o Elevate, Exercise Daily and Avoid Standing for Long Periods of Time. o Elevate legs to the level of the heart and pump ankles as often as possible o Elevate leg(s) parallel to the floor when sitting. o Compression Pump: Use compression pump on left lower extremity for 60 minutes, twice daily. o Compression Pump: Use compression pump on right lower extremity for 60 minutes, twice daily. Non-Wound Condition o Cleanse affected area with antibacterial soap and water, o Additional non-wound orders/instructions: - apply alginate and ABD for small drainage leaking area around newly healed skin Additional Orders / Instructions o Follow Nutritious Diet and Increase Protein Intake Electronic Signature(s) Signed: 08/29/2021 10:53:54 AM  By: Angelina Pih Signed: 08/30/2021 4:08:28 PM By: Baltazar Najjar MD Entered By: Angelina Pih on 08/29/2021 09:20:58 [name] ([id number]) -------------------------------------------------------------------------------- SuperBill Details Patient Name: [name] Date of Service: 08/29/2021 Medical Record Number: [id number] Patient Account Number: [id number] Date of Birth/Sex: Jan 02, 1951 (70 y.o. 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

## 2021-09-05 ENCOUNTER — Encounter: Payer: Medicare HMO | Attending: Physician Assistant | Admitting: Physician Assistant

## 2021-09-05 ENCOUNTER — Other Ambulatory Visit: Payer: Self-pay

## 2021-09-05 DIAGNOSIS — Z7901 Long term (current) use of anticoagulants: Secondary | ICD-10-CM | POA: Diagnosis not present

## 2021-09-05 DIAGNOSIS — I890 Lymphedema, not elsewhere classified: Secondary | ICD-10-CM | POA: Diagnosis not present

## 2021-09-05 DIAGNOSIS — Z86718 Personal history of other venous thrombosis and embolism: Secondary | ICD-10-CM | POA: Insufficient documentation

## 2021-09-05 DIAGNOSIS — L97522 Non-pressure chronic ulcer of other part of left foot with fat layer exposed: Secondary | ICD-10-CM | POA: Diagnosis not present

## 2021-09-05 DIAGNOSIS — E11621 Type 2 diabetes mellitus with foot ulcer: Secondary | ICD-10-CM | POA: Insufficient documentation

## 2021-09-05 DIAGNOSIS — I10 Essential (primary) hypertension: Secondary | ICD-10-CM | POA: Diagnosis not present

## 2021-09-05 DIAGNOSIS — L97512 Non-pressure chronic ulcer of other part of right foot with fat layer exposed: Secondary | ICD-10-CM | POA: Insufficient documentation

## 2021-09-05 DIAGNOSIS — Z89421 Acquired absence of other right toe(s): Secondary | ICD-10-CM | POA: Insufficient documentation

## 2021-09-05 NOTE — Progress Notes (Addendum)
[name], Reid O. ([id number]) Visit Report for 09/05/2021 Chief Complaint Document Details Patient Name: [name], Jerry O. Date of Service: 09/05/2021 2:15 PM Medical Record Number: [id number] Patient Account Number: [id number] Date of Birth/Sex: 1950/11/19 24(70 y.o. M) Treating RN: Angelina PihGordon, Caitlin Primary Care Provider: Beverely LowAdamo, Elena Other Clinician: Referring Provider: Beverely LowAdamo, Elena Treating Provider/Extender: Rowan BlaseStone, Jailyn Langhorst Weeks in Treatment: 17 Information Obtained from: Patient Chief Complaint Bilateral foot ulcers Electronic Signature(s) Signed: 09/05/2021 2:19:40 PM By: Lenda KelpStone III, Hrishikesh Hoeg PA-C Entered By: Lenda KelpStone III, Cloy Cozzens on 09/05/2021 14:19:40 [name], Jerry O. ([id number]) -------------------------------------------------------------------------------- HPI Details Patient Name: [name], Jerry O. Date of Service: 09/05/2021 2:15 PM Medical Record Number: [id number] Patient Account Number: [id number] Date of Birth/Sex: 1950/11/19 68(70 y.o. 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 Has been seen by [facility]ry [facility] for a number of years it sounds like at least 4 years. With that being said they have tried a split thickness skin graft along with multiple other topical remedies over the time they have been seeing him over the past several years. Unfortunately they are not able to get this closed I did refer him to us for further evaluation and treatment. The patient tells me has been quite sometime since he  remembers having an x-ray and again we could not find anything recent when looking through his chart. Fortunately there does not appear to be any signs of active infection systemically which is great news. No fevers, chills, nausea, vomiting, or diarrhea. The patient does have diabetes with his most recent hemoglobin A1c being November 14, 2020 at 9.1. He also does have evidence of lymphedema based on what I am seeing today. 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 05/10/2021 upon evaluation today patient appears to be doing well with regard to his wounds in general. I feel like he is making progress. Fortunately the compression wrap did well for him and overall I am extremely happy with where we stand. No fevers, chills, nausea, vomiting, or diarrhea. 05/17/2021 upon evaluation today patient appears to be doing better in general regard to his wounds. Overall I feel like he is actually signs of good improvement which is great news and I think that we are headed in the right direction. There does not appear to be any signs of active infection at this time. No fevers, chills, nausea, vomiting, or diarrhea. 05/24/2021 upon evaluation today patient appears to be doing well with regard to his wounds. He is actually showing signs of improvement this is great news. Its mainly the right foot proximal wound that is going to require debridement today and again I do need to try to clear away some of the lip where he had some epiboly I then work on this week by week but if we get that cleared away I think this will heal much more effectively and quickly. 05/31/2021 upon evaluation today patient appears to be doing well at this point in regard to  his wounds. Both are showing signs of improvement and very pleased in that regard. Fortunately there does not appear to  be any evidence of active infection systemically which is great news. No fevers, chills, nausea, vomiting, or diarrhea. 06/07/2021 upon evaluation today patient appears to be making excellent progress in regard to his foot ulcers. Both are showing signs of excellent improvement. Fortunately there does not appear to be any evidence of active infection which is great news. No fevers, chills, nausea, vomiting, or diarrhea. 06/21/2021 upon evaluation today patient appears to be doing well with regard to his wound. In fact he is making excellent progress and I am extremely pleased with where things stand today. There does not appear to be any signs of active infection at this time. 06/28/2021 upon evaluation patient's foot ulcer actually is showing signs of good improvement. I am actually extremely pleased with how we stand today and I think that he does require some sharp debridement at this point but nothing in comparison what we have had to do before. Overall I think were headed in the appropriate direction he is very pleased to hear this. 07/05/2021 upon evaluation today patient appears to be doing well with regard to his foot ulcer. In fact this is showing signs of excellent improvement which is great news. No fevers, chills, nausea, vomiting, or diarrhea. 07/12/2021 upon evaluation today patient appears to be doing well with regard to his wound. He has been tolerating the dressing changes without complication. Fortunately this is showing signs of excellent improvement which is great news. No fevers, chills, nausea, vomiting, or diarrhea. 07/19/2021 upon evaluation today patient appears to be doing well with regard to his leg ulcer in fact this appears to be completely closed at this point. I do not see anything open but I do believe we will get a need to continue to wrap him to help with compression therapy. He needs compression socks and I do not think he is quite ready to put those on yet he also  tells me that he needs to order some. 07/29/2021 on evaluation today patient still appears to be doing well in fact everything still is closed although he did swell a bit over the past week. Fortunately there does not appear to be any signs of active infection at this time which is great news. 08/05/2021 upon evaluation today patient appears to be doing well with regard to his wound. In fact this still appears to be healed I do not see any signs of active infection at this time which is great news. No fevers, chills, nausea, vomiting, or diarrhea. 08/19/2021 upon evaluation today patient appears to be doing well with regard to his wound. This is actually looking better it still not really open however the area that is healed still continues to weep and drain a bit as well or continue to wrap him. I do feel like this region is actually toughen up though quite nicely over the past several weeks. 09/05/2021 upon evaluation today patient appears to be doing well with regard to his wound. In fact this is still closed it has a new skin over top of that he still continues to weep around just from his lymphedema. Were using the compression wrap to keep this under control and overall that seems to be doing quite well. Unfortunately I do not see any signs of active infection locally nor systemically at this point which is great news. [name], [name] ([id number]) Electronic Signature(s) Signed: 09/05/2021 2:29:54 PM  By: Lenda Kelp PA-C Entered By: Lenda Kelp on 09/05/2021 14:29:54 [name] ([id number]) -------------------------------------------------------------------------------- Callus Pairing Details Patient Name: [name], [name] Date of Service: 09/05/2021 2:15 PM Medical Record Number: [id number] Patient Account Number: [id number] Date of Birth/Sex: 09/14/1950 (71 y.o. M) Treating RN: Angelina Pih Primary Care Provider: Beverely Low Other Clinician: Referring Provider: Beverely Low Treating Provider/Extender: Rowan Blase in Treatment: 17 Procedure Performed for: NonWound Condition Lymphedema - Right Leg Performed By: Physician Nelida Meuse., PA-C Post Procedure Diagnosis Same as Pre-procedure Notes right lateral foot #3 curette Electronic Signature(s) Signed: 09/09/2021 9:27:28 AM By: Angelina Pih Entered By: Angelina Pih on 09/05/2021 14:24:55 [name] ([id number]) -------------------------------------------------------------------------------- Physical Exam Details Patient Name: [name] Date of Service: 09/05/2021 2:15 PM Medical Record Number: [id number] Patient Account Number: [id number] Date of Birth/Sex: 1951/02/11 (71 y.o. M) Treating RN: Angelina Pih Primary Care Provider: Beverely Low Other Clinician: Referring Provider: Beverely Low Treating Provider/Extender: Rowan Blase in Treatment: 17 Constitutional Well-nourished and well-hydrated in no acute distress. Respiratory normal breathing without difficulty. Psychiatric this patient is able to make decisions and demonstrates good insight into disease process. Alert and Oriented x 3. pleasant and cooperative. Notes Upon inspection patient's wound bed showed signs of good granulation and epithelization at this point. Fortunately I do not see any evidence of active infection locally nor systemically which is great news and overall I do feel like that the patient has been tolerating the treatment plan at this point quite well. I did have to perform some callus paring to remove some of the callus around the side of the foot but overall things are doing well he had a little blister on the top of his foot I think this should be closed by next week. Electronic Signature(s) Signed: 09/05/2021 2:30:26 PM By: Lenda Kelp PA-C Entered By: Lenda Kelp on 09/05/2021 14:30:26 [name]  ([id number]) -------------------------------------------------------------------------------- Physician Orders Details Patient Name: [name] Date of Service: 09/05/2021 2:15 PM Medical Record Number: [id number] Patient Account Number: [id number] Date of Birth/Sex: May 03, 1951 (71 y.o. 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 o Nurse Visit as needed Bathing/ Shower/ Hygiene o May shower with wound dressing protected with water repellent cover or cast protector. o No tub bath. Edema Control - Lymphedema / Segmental Compressive Device / Other Right Lower Extremity o Optional: One layer of unna paste to top of compression wrap (to act as an anchor). o 3 Layer Compression System for Lymphedema. - right o Patient to wear own compression stockings. Remove compression stockings every night before going to bed and put on every morning when getting up. - Bring your compression socks to appt when they arrive in the mail o Elevate, Exercise Daily and Avoid Standing for Long Periods of Time. o Elevate legs to the level of the heart and pump ankles as often as possible o Elevate leg(s) parallel  to the floor when sitting. o Compression Pump: Use compression pump on left lower extremity for  60 minutes, twice daily. o Compression Pump: Use compression pump on right lower extremity for 60 minutes, twice daily. Non-Wound Condition o Cleanse affected area with antibacterial soap and water, o Additional non-wound orders/instructions: - apply alginate and ABD for small drainage leaking area around newly healed skin Additional Orders / Instructions o Follow Nutritious Diet and Increase Protein Intake Electronic Signature(s) Signed: 09/06/2021 5:21:10 PM By: Lenda Kelp PA-C Signed: 09/09/2021 9:27:28 AM By: Angelina Pih Entered By: Angelina Pih on 09/05/2021 14:27:11 [name] ([id number]) -------------------------------------------------------------------------------- Problem List Details Patient Name: [name] Date of Service: 09/05/2021 2:15 PM Medical Record Number: [id number] Patient Account Number: [id number] Date of Birth/Sex: [birth year] (71 y.o. 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) 05/03/2021 No Yes I10 Essential (primary) hypertension 05/03/2021 No Yes Z79.01 Long term (current) use of anticoagulants 05/03/2021 No Yes Z86.718 Personal history of other venous thrombosis and embolism 05/03/2021 No Yes Inactive Problems Resolved Problems Electronic Signature(s) Signed: 09/05/2021 2:19:35 PM By: Lenda Kelp PA-C Entered By: Lenda Kelp on 09/05/2021 14:19:34 [name]  ([id number]) -------------------------------------------------------------------------------- Progress Note Details Patient Name: [name] Date of Service: 09/05/2021 2:15 PM Medical Record Number: [id number] Patient Account Number: [id number] Date of Birth/Sex: December 15, 1950 (71 y.o. 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 Has been seen by [facility]ry [facility] for a number of years it sounds like at least 4 years. With that being said they have tried a split thickness skin graft along with multiple other topical remedies over the time they have been seeing him over the past several years. Unfortunately they are not able to get this closed I did refer him to [place] for further evaluation and treatment. The patient tells me has been quite sometime since he remembers having an x-ray and again we could not find anything recent when looking through his chart. Fortunately there does not appear to be any signs of active infection systemically which is great news. No fevers, chills, nausea, vomiting, or diarrhea. The patient does have diabetes with his most recent hemoglobin A1c being November 14, 2020 at 9.1. He also does have evidence of lymphedema based on what I am seeing today. 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 05/10/2021 upon evaluation today patient appears to be doing well with regard to his  wounds in general. I feel like he is making progress. Fortunately the compression wrap did well for him and overall I am extremely happy with where we stand. No fevers, chills, nausea, vomiting, or diarrhea. 05/17/2021 upon evaluation today patient appears to be doing better in general regard to his wounds. Overall I feel like he is actually signs of good improvement which is great news and I think that we are headed in the right direction. There does not appear to be any signs of active infection at this time. No fevers, chills, nausea, vomiting, or diarrhea. 05/24/2021 upon evaluation today patient appears to be doing well with regard to his wounds. He is actually showing signs of improvement this is great news. Its mainly the right foot proximal wound that is going to require debridement today and again I do need to try to clear away some of the lip where he had some epiboly I then work on this week by week but if we get that cleared away I think this will heal much more effectively and quickly. 05/31/2021 upon evaluation today patient appears to be doing well at this point in regard to his wounds. Both are showing signs of improvement and very pleased in that regard. Fortunately there does not appear to be any evidence of active infection systemically which is great news. No fevers, chills, nausea, vomiting, or diarrhea. 06/07/2021 upon evaluation today patient appears to be making excellent progress in regard to his foot ulcers. Both are showing signs of excellent improvement. Fortunately there does not appear to be any evidence of active infection which is great news. No fevers, chills, nausea, vomiting, or diarrhea. 06/21/2021 upon evaluation today patient appears to be doing well with regard to his wound. In fact he is making excellent progress and I am extremely pleased with where  things stand today. There does not appear to be any signs of active infection at this time. 06/28/2021 upon evaluation patient's foot ulcer actually is showing signs of good improvement. I am actually extremely pleased with how we stand today and I think that he does require some sharp debridement at this point but nothing in comparison what we have had to do before. Overall I think were headed in the appropriate direction he is very pleased to hear this. 07/05/2021 upon evaluation today patient appears to be doing well with regard to his foot ulcer. In fact this is showing signs of excellent improvement which is great news. No fevers, chills, nausea, vomiting, or diarrhea. 07/12/2021 upon evaluation today patient appears to be doing well with regard to his wound. He has been tolerating the dressing changes without complication. Fortunately this is showing signs of excellent improvement which is great news. No fevers, chills, nausea, vomiting, or diarrhea. 07/19/2021 upon evaluation today patient appears to be doing well with regard to his leg ulcer in fact this appears to be completely closed at this point. I do not see anything open but I do believe we will get a need to continue to wrap him to help with compression therapy. He needs compression socks and I do not think he is quite ready to put those on yet he also tells me that he needs to order some. 07/29/2021 on evaluation today patient still appears to be doing well in fact everything still is closed although he did swell a bit over the past week. Fortunately there does not appear to be any signs of active infection at this time which is great news. 08/05/2021 upon  evaluation today patient appears to be doing well with regard to his wound. In fact this still appears to be healed I do not see any signs of active infection at this time which is great news. No fevers, chills, nausea, vomiting, or diarrhea. 08/19/2021 upon evaluation today patient  appears to be doing well with regard to his wound. This is actually looking better it still not really open however the area that is healed still continues to weep and drain a bit as well or continue to wrap him. I do feel like this region is actually toughen up though quite nicely over the past several weeks. [name], [name] [place] ([id number]) 09/05/2021 upon evaluation today patient appears to be doing well with regard to his wound. In fact this is still closed it has a new skin over top of that he still continues to weep around just from his lymphedema. Were using the compression wrap to keep this under control and overall that seems to be doing quite well. Unfortunately I do not see any signs of active infection locally nor systemically at this point which is great news. Objective Constitutional Well-nourished and well-hydrated in no acute distress. Vitals Time Taken: 2:01 PM, Height: 71 in, Weight: 255 lbs, BMI: 35.6, Temperature: 97.9 F, Pulse: 97 bpm, Respiratory Rate: 18 breaths/min, Blood Pressure: 132/71 mmHg. Respiratory normal breathing without difficulty. Psychiatric this patient is able to make decisions and demonstrates good insight into disease process. Alert and Oriented x 3. pleasant and cooperative. General Notes: Upon inspection patient's wound bed showed signs of good granulation and epithelization at this point. Fortunately I do not see any evidence of active infection locally nor systemically which is great news and overall I do feel like that the patient has been tolerating the treatment plan at this point quite well. I did have to perform some callus paring to remove some of the callus around the side of the foot but overall things are doing well he had a little blister on the top of his foot I think this should be closed by next week. Other Condition(s) Patient presents with Lymphedema located on the Right Leg. 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 by Nelida Meuse., PA-C. Post procedure Diagnosis Wound #: Same as Pre-Procedure Notes: right lateral foot #3 curette Plan Follow-up Appointments: Return Appointment in 1 week. Nurse Visit as needed Bathing/ Shower/ Hygiene: [name], [name] ([id number]) May shower with wound dressing protected with water repellent cover or cast protector. No tub bath. Edema Control - Lymphedema / Segmental Compressive Device / Other: Optional: One layer of unna paste to top of compression wrap (to act as an anchor). 3 Layer Compression System for Lymphedema. - right Patient to wear own compression stockings. Remove compression stockings every night before going to bed and put on every morning when getting up. - Bring your compression socks to appt when they arrive in the mail Elevate, Exercise Daily and Avoid Standing for Long Periods of Time. Elevate legs to the level of the heart and pump ankles as often as possible Elevate leg(s) parallel to the floor when sitting. Compression Pump: Use compression pump on left  lower extremity for 60 minutes, twice daily. Compression Pump: Use compression pump on right lower extremity for 60 minutes, twice daily. Non-Wound Condition: Cleanse affected area with antibacterial soap and water, Additional non-wound orders/instructions: - apply alginate and ABD for small drainage leaking area around newly healed skin Additional Orders / Instructions: Follow Nutritious Diet and Increase Protein Intake 1. Would recommend currently that we continue with the alginate over the areas  where he has some weeping. 2. We will continue as well the 3 layer compression wrap which is doing a good job. 3. I am also going to suggest he continue to elevate his legs much as possible to help with edema control. We will see patient back for reevaluation in 1 week here in the clinic. If anything worsens or changes patient will contact our office for additional recommendations. Electronic Signature(s) Signed: 09/05/2021 2:30:45 PM By: Lenda KelpStone III, Mkayla Steele PA-C Entered By: Lenda KelpStone III, Lejuan Botto on 09/05/2021 14:30:44 [name], Glenn O. ([id number]) -------------------------------------------------------------------------------- SuperBill Details Patient Name: [name], Jerry O. Date of Service: 09/05/2021 Medical Record Number: [id number] Patient Account Number: [id number] Date of Birth/Sex: 25-Apr-1951 [age] y.o. 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 with fat layer exposed Z89.421 Acquired absence of other right toe(s) I10 Essential (primary) hypertension Z79.01 Long term (current) use of anticoagulants Z86.718 Personal history of other venous thrombosis and embolism Facility Procedures CPT4 Code: [id number] Description: 11055 - PARE BENIGN LES; SGL Modifier: Quantity: 1 CPT4 Code: Description: ICD-10 Diagnosis Description L97.512 Non-pressure chronic ulcer of other part of right foot with fat layer Modifier: exposed Quantity: Physician Procedures CPT4 Code: 3086578: [id number] Description: 11055 - WC PHYS PARE BENIGN LES; SGL Modifier: Quantity: 1 CPT4 Code: Description: ICD-10 Diagnosis Description L97.512  Non-pressure chronic ulcer of other part of right foot with fat layer ex Modifier: posed Quantity: Electronic Signature(s) Signed: 09/05/2021 2:32:24 PM By: Lenda KelpStone III, Conner Neiss PA-C Entered By: Lenda KelpStone III, Saidee Geremia on 09/05/2021 14:32:24

## 2021-09-09 NOTE — Progress Notes (Signed)
LAWRENCE, MITCH ([id number]) Visit Report for 09/05/2021 Arrival Information Details Patient Name: [name], [name] Date of Service: 09/05/2021 2:15 PM Medical Record Number: [id number] Patient Account Number: [id number] Date of Birth/Sex: 1951-05-26 (71 y.o. M) Treating RN: Angelina Pih Primary Care Wilberto Console: Beverely Low Other Clinician: Referring Elwyn Lowden: Beverely Low Treating Irelyn Perfecto/Extender: Rowan Blase in Treatment: 17 Visit Information History Since Last Visit Added or deleted any medications: No Patient Arrived: [name] Any new allergies or adverse reactions: No Arrival Time: 13:58 Had a fall or experienced change in No Accompanied By: self activities of daily living that may affect Transfer Assistance: None risk of falls: Patient Identification Verified: Yes Hospitalized since last visit: No Secondary Verification Process Completed: Yes Has Dressing in Place as Prescribed: Yes Patient Has Alerts: Yes Has Compression in Place as Prescribed: Yes Patient Alerts: Patient on Blood Thinner Pain Present Now: No Type II Diabetic 81mg  aspirin non compressible ABI 1/22 Electronic Signature(s) Signed: 09/09/2021 9:27:28 AM By: 11/07/2021 Entered By: Angelina Pih on 09/05/2021 14:00:38 11/03/2021 (Jerry Bad) -------------------------------------------------------------------------------- Clinic Level of Care Assessment Details Patient Name: [id number] Date of Service: 09/05/2021 2:15 PM Medical Record Number: 11/03/2021 Patient Account Number: [id number] Date of Birth/Sex: 1950/11/21 (71 y.o. M) Treating RN: (75 Primary Care Jerimiah Wolman: Angelina Pih Other Clinician: Referring Finnleigh Marchetti: Beverely Low Treating Shalisa Mcquade/Extender: Beverely Low in Treatment: 17 Clinic Level of Care Assessment Items TOOL 1 Quantity Score []  - Use when EandM and Procedure is performed on INITIAL visit 0 ASSESSMENTS - Nursing Assessment /  Reassessment []  - General Physical Exam (combine w/ comprehensive assessment (listed just below) when performed on new 0 pt. evals) []  - 0 Comprehensive Assessment (HX, ROS, Risk Assessments, Wounds Hx, etc.) ASSESSMENTS - Wound and Skin Assessment / Reassessment []  - Dermatologic / Skin Assessment (not related to wound area) 0 ASSESSMENTS - Ostomy and/or Continence Assessment and Care []  - Incontinence Assessment and Management 0 []  - 0 Ostomy Care Assessment and Management (repouching, etc.) PROCESS - Coordination of Care []  - Simple Patient / Family Education for ongoing care 0 []  - 0 Complex (extensive) Patient / Family Education for ongoing care []  - 0 Staff obtains Rowan Blase, Records, Test Results / Process Orders []  - 0 Staff telephones HHA, Nursing Homes / Clarify orders / etc []  - 0 Routine Transfer to another Facility (non-emergent condition) []  - 0 Routine Hospital Admission (non-emergent condition) []  - 0 New Admissions / / Ordering NPWT, Apligraf, etc. []  - 0 Emergency Hospital Admission (emergent condition) PROCESS - Special Needs []  - Pediatric / Minor Patient Management 0 []  - 0 Isolation Patient Management []  - 0 Hearing / Language / Visual special needs []  - 0 Assessment of Community assistance (transportation, D/C planning, etc.) []  - 0 Additional assistance / Altered mentation []  - 0 Support Surface(s) Assessment (bed, cushion, seat, etc.) INTERVENTIONS - Miscellaneous []  - External ear exam 0 []  - 0 Patient Transfer (multiple staff / / Similar devices) []  - 0 Simple Staple / Suture removal (25 or less) []  - 0 Complex Staple / Suture removal (26 or more) []  - 0 Hypo/Hyperglycemic Management (do not check if billed separately) []  - 0 Ankle / Brachial Index (ABI) - do not check if billed separately Has the patient been seen at the hospital within the last three years: Yes Total Score: 0 Level Of Care:  ____ ( ) Electronic Signature(s) Signed: 09/09/2021 9:27:28 AM By: Entered By:  on 09/05/2021 14:27:50 [name], Jerry O. ([id number]) -------------------------------------------------------------------------------- Encounter Discharge Information Details Patient Name: [name], Jerry O. Date of Service: 09/05/2021 2:15 PM Medical Record Number: [id number] Patient Account Number: [id number] Date of Birth/Sex: Oct 20, 1950 [age] y.o. M) Treating RN: Angelina PihGordon, [name] Primary Care Evellyn Tuff: Beverely LowAdamo, Elena Other Clinician: Referring Yusif Gnau: Beverely LowAdamo, Elena Treating Miriana Gaertner/Extender: Rowan BlaseStone, Hoyt Weeks in Treatment: 17 Encounter Discharge Information Items Discharge Condition: Stable Ambulatory Status: Walker Discharge Destination: Home Transportation: Private Auto Accompanied By: self Schedule Follow-up Appointment: Yes Clinical Summary of Care: Electronic Signature(s) Signed: 09/09/2021 9:27:28 AM By: Angelina PihGordon, [name] Entered By: Angelina PihGordon, [name] on 09/05/2021 14:42:06 [name], Antwian O. ([id number]) -------------------------------------------------------------------------------- Lower Extremity Assessment Details Patient Name: [name], Jerry O. Date of Service: 09/05/2021 2:15 PM Medical Record Number: [id number] Patient Account Number: [id number] Date of Birth/Sex: Oct 20, 1950 [age] y.o. M) Treating RN: Angelina PihGordon, [name] Primary Care Iliza Blankenbeckler: Beverely LowAdamo, Elena Other Clinician: Referring Henri Guedes: Beverely LowAdamo, Elena Treating Chenay Nesmith/Extender: Rowan BlaseStone, Hoyt Weeks in Treatment: 17 Edema Assessment Assessed: [Left: No] [Right: No] [Left: Edema] [Right: :] Calf Left: Right: Point of Measurement: 31 cm From Medial Instep 43.3 cm Ankle Left: Right: Point of Measurement: 10 cm From Medial Instep 25.5 cm Vascular Assessment Pulses: Dorsalis Pedis Palpable: [Right:Yes] Posterior Tibial Palpable: [Right:Yes] Electronic Signature(s) Signed:  09/09/2021 9:27:28 AM By: Angelina PihGordon, [name] Entered By: Angelina PihGordon, [name] on 09/05/2021 14:13:50 [name], Arieh O. ([id number]) -------------------------------------------------------------------------------- Multi Wound Chart Details Patient Name: [name], Jerry O. Date of Service: 09/05/2021 2:15 PM Medical Record Number: [id number] Patient Account Number: [id number] Date of Birth/Sex: Oct 20, 1950 [age] y.o. M) Treating RN: Angelina PihGordon, [name] Primary Care Jalayne Ganesh: Beverely LowAdamo, Elena Other Clinician: Referring Gabrella Stroh: Beverely LowAdamo, Elena Treating Neyland Pettengill/Extender: Rowan BlaseStone, Hoyt Weeks in Treatment: 17 Vital Signs Height(in): 71 Pulse(bpm): 97 Weight(lbs): 255 Blood Pressure(mmHg): 132/71 Body Mass Index(BMI): 36 Temperature(F): 97.9 Respiratory Rate(breaths/min): 18 Wound Assessments Treatment Notes Electronic Signature(s) Signed: 09/09/2021 9:27:28 AM By: Angelina PihGordon, [name] Entered By: Angelina PihGordon, [name] on 09/05/2021 14:21:32 [name], Jerry O. ([id number]) -------------------------------------------------------------------------------- Multi-Disciplinary Care Plan Details Patient Name: [name], Jerry O. Date of Service: 09/05/2021 2:15 PM Medical Record Number: [id number] Patient Account Number: [id number] Date of Birth/Sex: Oct 20, 1950 [age] y.o. M) Treating RN: Angelina PihGordon, [name] Primary Care Treavon Castilleja: Beverely LowAdamo, Elena Other Clinician: Referring Masyn Fullam: Beverely LowAdamo, Elena Treating Destiney Sanabia/Extender: Rowan BlaseStone, Hoyt Weeks in Treatment: 17 Active Inactive Electronic Signature(s) Signed: 09/09/2021 9:27:28 AM By: Angelina PihGordon, [name] Entered By: Angelina PihGordon, [name] on 09/05/2021 14:21:17 [name], Amauris O. ([id number]) -------------------------------------------------------------------------------- Non-Wound Condition Assessment Details Patient Name: [name], Jerry O. Date of Service: 09/05/2021 2:15 PM Medical Record Number: [id number] Patient Account Number: [id number] Date of Birth/Sex: Oct 20, 1950 [age] y.o. M) Treating  RN: Angelina PihGordon, [name] Primary Care Amye Grego: Beverely LowAdamo, Elena Other Clinician: Referring Skyrah Krupp: Beverely LowAdamo, Elena Treating Grizel Vesely/Extender: Allen DerryStone, Hoyt Weeks in Treatment: 17 Non-Wound Condition: Condition: Lymphedema Location: Leg Side: Right Photos Electronic Signature(s) Signed: 09/09/2021 9:27:28 AM By: Angelina PihGordon, [name] Entered By: Angelina PihGordon, [name] on 09/05/2021 14:12:45 [name], Jerry O. ([id number]) -------------------------------------------------------------------------------- Pain Assessment Details Patient Name: [name], Jerry O. Date of Service: 09/05/2021 2:15 PM Medical Record Number: [id number] Patient Account Number: [id number] Date of Birth/Sex: Oct 20, 1950 [age] y.o. M) Treating RN: Angelina PihGordon, [name] Primary Care Hashem Goynes: Beverely LowAdamo, Elena Other Clinician: Referring Kissy Cielo: Beverely LowAdamo, Elena Treating Tinie Mcgloin/Extender: Rowan BlaseStone, Hoyt Weeks in Treatment: 17 Active Problems Location of Pain Severity and Description of Pain Patient Has Paino No Site Locations Rate the pain. Current Pain Level: 0 Pain Management and Medication Current Pain Management: Electronic Signature(s) Signed: 09/09/2021 9:27:28 AM By: Angelina PihGordon, [name] Entered By: Angelina PihGordon, [name] on 09/05/2021 14:02:41 [name], Jerry O. ([id number]) -------------------------------------------------------------------------------- Patient/Caregiver Education Details Patient Name: [name], Jerry  Val Horn Date of Service: 09/05/2021 2:15 PM Medical Record Number: [id number] Patient Account Number: [id number] Date of Birth/Gender: 01-Jan-1951 (71 y.o. M) Treating RN: Angelina Pih Primary Care Physician: Beverely Low Other Clinician: Referring Physician: Beverely Low Treating Physician/Extender: Rowan Blase in Treatment: 17 Education Assessment Education Provided To: Patient Education Topics Provided Wound/Skin Impairment: Handouts: Caring for Your Ulcer Methods: Explain/Verbal Responses: State content correctly Electronic  Signature(s) Signed: 09/09/2021 9:27:28 AM By: Angelina Pih Entered By: Angelina Pih on 09/05/2021 14:41:26 Jerry Bad ([id number]) -------------------------------------------------------------------------------- Vitals Details Patient Name: Jerry Bad Date of Service: 09/05/2021 2:15 PM Medical Record Number: [id number] Patient Account Number: [id number] Date of Birth/Sex: 01-13-1951 (71 y.o. M) Treating RN: Angelina Pih Primary Care Myrle Wanek: Beverely Low Other Clinician: Referring Dionisia Pacholski: Beverely Low Treating Jeanenne Licea/Extender: Rowan Blase in Treatment: 17 Vital Signs Time Taken: 14:01 Temperature (F): 97.9 Height (in): 71 Pulse (bpm): 97 Weight (lbs): 255 Respiratory Rate (breaths/min): 18 Body Mass Index (BMI): 35.6 Blood Pressure (mmHg): 132/71 Reference Range: 80 - 120 mg / dl Electronic Signature(s) Signed: 09/09/2021 9:27:28 AM By: Angelina Pih Entered By: Angelina Pih on 09/05/2021 14:02:15

## 2021-09-12 ENCOUNTER — Encounter: Payer: Medicare HMO | Admitting: Physician Assistant

## 2021-09-12 ENCOUNTER — Other Ambulatory Visit: Payer: Self-pay

## 2021-09-12 DIAGNOSIS — E11621 Type 2 diabetes mellitus with foot ulcer: Secondary | ICD-10-CM | POA: Diagnosis not present

## 2021-09-12 NOTE — Progress Notes (Signed)
[name], [name] ([id number]) Visit Report for 09/12/2021 Arrival Information Details Patient Name: [name], [name] Date of Service: 09/12/2021 2:15 PM Medical Record Number: [id number] Patient Account Number: [id number] Date of Birth/Sex: 1951-03-19 (71 y.o. M) Treating RN: Levora Dredge Primary Care Emmilee Reamer: Beverlyn Roux Other Clinician: Referring Anuja Manka: Beverlyn Roux Treating Channie Bostick/Extender: Skipper Cliche in Treatment: 72 Visit Information History Since Last Visit Added or deleted any medications: No Patient Arrived: [name] Any new allergies or adverse reactions: No Arrival Time: 14:15 Had a fall or experienced change in No Accompanied By: self activities of daily living that may affect Transfer Assistance: None risk of falls: Patient Identification Verified: Yes Hospitalized since last visit: No Secondary Verification Process Completed: Yes Has Dressing in Place as Prescribed: Yes Patient Has Alerts: Yes Has Compression in Place as Prescribed: Yes Patient Alerts: Patient on Blood Thinner Pain Present Now: No 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 M) Treating RN: Levora Dredge Primary Care Reyce Lubeck: Beverlyn Roux Other Clinician: Referring Victoire Deans: Beverlyn Roux Treating Jujhar Everett/Extender: Skipper Cliche in Treatment: 72 Clinic Level of Care Assessment Items TOOL 1 Quantity Score []  - Use when EandM and Procedure is performed on INITIAL visit 0 ASSESSMENTS - Nursing Assessment /  Reassessment []  - General Physical Exam (combine w/ comprehensive assessment (listed just below) when performed on new 0 pt. evals) []  - 0 Comprehensive Assessment (HX, ROS, Risk Assessments, Wounds Hx, etc.) ASSESSMENTS - Wound and Skin Assessment / Reassessment []  - Dermatologic / Skin Assessment (not related to wound area) 0 ASSESSMENTS - Ostomy and/or Continence Assessment and Care []  - Incontinence Assessment and Management 0 []  - 0 Ostomy Care Assessment and Management (repouching, etc.) PROCESS - Coordination of Care []  - Simple Patient / Family Education for ongoing care 0 []  - 0 Complex (extensive) Patient / Family Education for ongoing care []  - 0 Staff obtains Programmer, systems, Records, Test Results / Process Orders []  - 0 Staff telephones HHA, Nursing Homes / Clarify orders / etc []  - 0 Routine Transfer to another Facility (non-emergent condition) []  - 0 Routine Hospital Admission (non-emergent condition) []  - 0 New Admissions / Biomedical engineer / Ordering NPWT, Apligraf, etc. []  - 0 Emergency Hospital Admission (emergent condition) PROCESS - Special Needs []  - Pediatric / Minor Patient Management 0 []  - 0 Isolation Patient Management []  - 0 Hearing / Language / Visual special needs []  - 0 Assessment of Community assistance (transportation, D/C planning, etc.) []  - 0 Additional assistance / Altered mentation []  - 0 Support Surface(s) Assessment (bed, cushion, seat, etc.) INTERVENTIONS - Miscellaneous []  - External ear exam 0 []  - 0 Patient Transfer (multiple staff / Civil Service fast streamer / Similar devices) []  - 0 Simple Staple / Suture removal (25 or less) []  - 0 Complex Staple / Suture removal (26 or more) []  - 0 Hypo/Hyperglycemic Management (do not check if billed separately) []  - 0 Ankle / Brachial Index (ABI) - do not check if billed separately Has the patient been seen at the hospital within the last three years: Yes Total Score: 0 Level Of Care:  ____ [name] ([id number]) Electronic Signature(s) Signed: 09/12/2021 3:46:45 PM By: Levora Dredge Entered By: Levora Dredge  on 09/12/2021 14:33:56 [name], [name] ([id number]) -------------------------------------------------------------------------------- Compression Therapy Details Patient Name: [name], [name] Date of Service: 09/12/2021 2:15 PM Medical Record Number: [id number] Patient Account Number: [id number] Date of Birth/Sex: 11/11/1950 (71 y.o. M) Treating RN: Levora Dredge Primary Care Jamesyn Moorefield: Beverlyn Roux Other Clinician: Referring Leroi Haque: Beverlyn Roux Treating Cloe Sockwell/Extender: Skipper Cliche in Treatment: 71 Compression Therapy Performed for Wound Assessment: NonWound Condition Lymphedema - Right Leg Performed By: Clinician Levora Dredge, RN Compression Type: Three Layer Post Procedure Diagnosis Same as Pre-procedure Electronic Signature(s) Signed: 09/12/2021 3:46:45 PM By: Levora Dredge Entered By: Levora Dredge on 09/12/2021 14:33:35 [name] ([id number]) -------------------------------------------------------------------------------- Encounter Discharge Information Details Patient Name: [name] Date of Service: 09/12/2021 2:15 PM Medical Record Number: [id number] Patient Account Number: [id number] Date of Birth/Sex: 21-Apr-1951 (71 y.o. M) Treating RN: Levora Dredge Primary Care Navea Woodrow: Beverlyn Roux Other Clinician: Referring Caylen Kuwahara: Beverlyn Roux Treating Remberto Lienhard/Extender: Skipper Cliche in Treatment: 72 Encounter Discharge Information Items Discharge Condition: Stable Ambulatory Status: Walker Discharge Destination: Home Transportation: Private Auto Accompanied By: self Schedule Follow-up Appointment: Yes Clinical Summary of Care: Electronic Signature(s) Signed: 09/12/2021 3:46:45 PM By: Levora Dredge Entered By: Levora Dredge on 09/12/2021 14:44:25 [name]  ([id number]) -------------------------------------------------------------------------------- Lower Extremity Assessment Details Patient Name: [name] Date of Service: 09/12/2021 2:15 PM Medical Record Number: [id number] Patient Account Number: [id number] Date of Birth/Sex: [birth year] (71 y.o. M) Treating RN: Levora Dredge Primary Care Pristine Gladhill: Beverlyn Roux Other Clinician: Referring Nasiya Pascual: Beverlyn Roux Treating Maika Kaczmarek/Extender: Skipper Cliche in Treatment: 72 Edema Assessment Assessed: [Left: No] [Right: No] [Left: Edema] [Right: :] Calf Left: Right: Point of Measurement: 31 cm From Medial Instep 45 cm Ankle Left: Right: Point of Measurement: 10 cm From Medial Instep 25.5 cm Vascular Assessment Pulses: Dorsalis Pedis Palpable: [Right:Yes] Posterior Tibial Palpable: [Right:Yes] Electronic Signature(s) Signed: 09/12/2021 3:46:45 PM By: Levora Dredge Entered By: Levora Dredge on 09/12/2021 14:24:16 [name] ([id number]) -------------------------------------------------------------------------------- Multi Wound Chart Details Patient Name: [name] Date of Service: 09/12/2021 2:15 PM Medical Record Number: [id number] Patient Account Number: [id number] Date of Birth/Sex: 1951-02-19 (71 y.o. M) Treating RN: Levora Dredge Primary Care Dennison Mcdaid: Beverlyn Roux Other Clinician: Referring Skyley Grandmaison: Beverlyn Roux Treating Arantza Darrington/Extender: Skipper Cliche in Treatment: 72 Vital Signs Height(in): 71 Pulse(bpm): 99 Weight(lbs): 255 Blood Pressure(mmHg): 144/70 Body Mass Index(BMI): 36 Temperature(F): 97.8 Respiratory Rate(breaths/min): 18 Wound Assessments Treatment Notes Electronic Signature(s) Signed: 09/12/2021 3:46:45 PM By: Levora Dredge Entered By: Levora Dredge on 09/12/2021 14:31:46 [name] ([id number]) -------------------------------------------------------------------------------- [place] Details Patient Name: [name] Date of Service: 09/12/2021 2:15 PM Medical Record Number: [id number] Patient Account Number: [id number] Date of Birth/Sex: October 12, 1950 (71 y.o. M) Treating RN: Levora Dredge Primary Care Nuala Chiles: Beverlyn Roux Other Clinician: Referring Danyetta Gillham: Beverlyn Roux Treating Sylwia Cuervo/Extender: Skipper Cliche in Treatment: 72 Active Inactive Electronic Signature(s) Signed: 09/12/2021 3:46:45 PM By: Levora Dredge Entered By: Levora Dredge on 09/12/2021 14:31:09 [name] ([id number]) -------------------------------------------------------------------------------- Non-Wound Condition Assessment Details Patient Name: [name] Date of Service: 09/12/2021 2:15 PM Medical Record Number: [id number] Patient Account Number: [id number] Date of Birth/Sex: 30-Jul-1951 (71 y.o. M) Treating RN: Levora Dredge Primary Care Goerge Mohr: Beverlyn Roux Other Clinician: Referring Zelma Snead: Beverlyn Roux Treating Phenix Vandermeulen/Extender: Skipper Cliche in Treatment: 72 Non-Wound Condition: Condition: Lymphedema Location: Leg Side: Right Photos Notes minimal drainage noted to right side of foot Electronic Signature(s) Signed: 09/12/2021 3:46:45 PM By: Levora Dredge Entered By: Levora Dredge on 09/12/2021 14:23:16 [name] ([id number]) --------------------------------------------------------------------------------  Pain Assessment Details Patient Name: [name], [name]. Date of Service: 09/12/2021 2:15 PM Medical Record Number: [id number] Patient Account Number: [id number] Date of Birth/Sex: Jan 31, 1951 (71 y.o. M) Treating RN: Levora Dredge Primary Care Thais Silberstein: Beverlyn Roux Other Clinician: Referring Truth Barot: Beverlyn Roux Treating Walburga Hudman/Extender: Skipper Cliche in Treatment: 72 Active Problems Location of Pain Severity and Description of Pain Patient Has Paino No Site Locations Rate the pain. Current  Pain Level: 0 Pain Management and Medication Current Pain Management: Electronic Signature(s) Signed: 09/12/2021 3:46:45 PM By: Levora Dredge Entered By: Levora Dredge on 09/12/2021 14:16:32 [name] ([id number]) -------------------------------------------------------------------------------- Patient/Caregiver Education Details Patient Name: [name] Date of Service: 09/12/2021 2:15 PM Medical Record Number: [id number] Patient Account Number: [id number] Date of Birth/Gender: [birth year] (71 y.o. M) Treating RN: Levora Dredge Primary Care Physician: Beverlyn Roux Other Clinician: Referring Physician: Beverlyn Roux Treating Physician/Extender: Skipper Cliche in Treatment: 60 Education Assessment Education Provided To: Patient Education Topics Provided Wound/Skin Impairment: Handouts: Caring for Your Ulcer Methods: Explain/Verbal Responses: State content correctly Electronic Signature(s) Signed: 09/12/2021 3:46:45 PM By: Levora Dredge Entered By: Levora Dredge on 09/12/2021 14:43:38 [name] ([id number]) -------------------------------------------------------------------------------- Vitals Details Patient Name: [name] Date of Service: 09/12/2021 2:15 PM Medical Record Number: [id number] Patient Account Number: [id number] Date of Birth/Sex: [birth year] (71 y.o. M) Treating RN: Levora Dredge Primary Care Amaan Meyer: Beverlyn Roux Other Clinician: Referring Omar Gayden: Beverlyn Roux Treating Madie Cahn/Extender: Skipper Cliche in Treatment: 72 Vital Signs Time Taken: 14:15 Temperature (F): 97.8 Height (in): 71 Pulse (bpm): 99 Weight (lbs): 255 Respiratory Rate (breaths/min): 18 Body Mass Index (BMI): 35.6 Blood Pressure (mmHg): 144/70 Reference Range: 80 - 120 mg / dl Electronic Signature(s) Signed: 09/12/2021 3:46:45 PM By: Levora Dredge Entered By: Levora Dredge on 09/12/2021 14:16:20

## 2021-09-12 NOTE — Progress Notes (Addendum)
[name], Jerry O. ([id number]) Visit Report for 09/12/2021 Chief Complaint Document Details Patient Name: [name], Jerry O. Date of Service: 09/12/2021 2:15 PM Medical Record Number: [id number] Patient Account Number: [id number] Date of Birth/Sex: 10-Oct-1950 [age] y.o. M) Treating RN: Angelina PihGordon, Caitlin Primary Care Provider: Beverely LowAdamo, Elena Other Clinician: Referring Provider: Beverely LowAdamo, Elena Treating Provider/Extender: Rowan BlaseStone, Lamyia Cdebaca Weeks in Treatment: 18 Information Obtained from: Patient Chief Complaint Bilateral foot ulcers Electronic Signature(s) Signed: 09/12/2021 2:29:39 PM By: Lenda KelpStone III, Kniyah Khun PA-C Entered By: Lenda KelpStone III, Horst Ostermiller on 09/12/2021 14:29:38 [name], Jerry O. ([id number]) -------------------------------------------------------------------------------- HPI Details Patient Name: [name], Jerry O. Date of Service: 09/12/2021 2:15 PM Medical Record Number: [id number] Patient Account Number: [id number] Date of Birth/Sex: 10-Oct-1950 [age] y.o. 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 Has been seen by [facility]iatry [facility] for a number of years it sounds like at least 4 years. With that being said they have tried a split thickness skin graft along with multiple other topical remedies over the time they have been seeing him over the past several years. Unfortunately they are not able to get this closed I did refer him to us for further evaluation and treatment. The patient tells me has been quite sometime since he  remembers having an x-ray and again we could not find anything recent when looking through his chart. Fortunately there does not appear to be any signs of active infection systemically which is great news. No fevers, chills, nausea, vomiting, or diarrhea. The patient does have diabetes with his most recent hemoglobin A1c being November 14, 2020 at 9.1. He also does have evidence of lymphedema based on what I am seeing today. 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 05/10/2021 upon evaluation today patient appears to be doing well with regard to his wounds in general. I feel like he is making progress. Fortunately the compression wrap did well for him and overall I am extremely happy with where we stand. No fevers, chills, nausea, vomiting, or diarrhea. 05/17/2021 upon evaluation today patient appears to be doing better in general regard to his wounds. Overall I feel like he is actually signs of good improvement which is great news and I think that we are headed in the right direction. There does not appear to be any signs of active infection at this time. No fevers, chills, nausea, vomiting, or diarrhea. 05/24/2021 upon evaluation today patient appears to be doing well with regard to his wounds. He is actually showing signs of improvement this is great news. Its mainly the right foot proximal wound that is going to require debridement today and again I do need to try to clear away some of the lip where he had some epiboly I then work on this week by week but if we get that cleared away I think this will heal much more effectively and quickly. 05/31/2021 upon evaluation today patient appears to be doing well at this point in regard to  his wounds. Both are showing signs of improvement and very pleased in that regard. Fortunately there does not appear to  be any evidence of active infection systemically which is great news. No fevers, chills, nausea, vomiting, or diarrhea. 06/07/2021 upon evaluation today patient appears to be making excellent progress in regard to his foot ulcers. Both are showing signs of excellent improvement. Fortunately there does not appear to be any evidence of active infection which is great news. No fevers, chills, nausea, vomiting, or diarrhea. 06/21/2021 upon evaluation today patient appears to be doing well with regard to his wound. In fact he is making excellent progress and I am extremely pleased with where things stand today. There does not appear to be any signs of active infection at this time. 06/28/2021 upon evaluation patient's foot ulcer actually is showing signs of good improvement. I am actually extremely pleased with how we stand today and I think that he does require some sharp debridement at this point but nothing in comparison what we have had to do before. Overall I think were headed in the appropriate direction he is very pleased to hear this. 07/05/2021 upon evaluation today patient appears to be doing well with regard to his foot ulcer. In fact this is showing signs of excellent improvement which is great news. No fevers, chills, nausea, vomiting, or diarrhea. 07/12/2021 upon evaluation today patient appears to be doing well with regard to his wound. He has been tolerating the dressing changes without complication. Fortunately this is showing signs of excellent improvement which is great news. No fevers, chills, nausea, vomiting, or diarrhea. 07/19/2021 upon evaluation today patient appears to be doing well with regard to his leg ulcer in fact this appears to be completely closed at this point. I do not see anything open but I do believe we will get a need to continue to wrap him to help with compression therapy. He needs compression socks and I do not think he is quite ready to put those on yet he also  tells me that he needs to order some. 07/29/2021 on evaluation today patient still appears to be doing well in fact everything still is closed although he did swell a bit over the past week. Fortunately there does not appear to be any signs of active infection at this time which is great news. 08/05/2021 upon evaluation today patient appears to be doing well with regard to his wound. In fact this still appears to be healed I do not see any signs of active infection at this time which is great news. No fevers, chills, nausea, vomiting, or diarrhea. 08/19/2021 upon evaluation today patient appears to be doing well with regard to his wound. This is actually looking better it still not really open however the area that is healed still continues to weep and drain a bit as well or continue to wrap him. I do feel like this region is actually toughen up though quite nicely over the past several weeks. 09/05/2021 upon evaluation today patient appears to be doing well with regard to his wound. In fact this is still closed it has a new skin over top of that he still continues to weep around just from his lymphedema. Were using the compression wrap to keep this under control and overall that seems to be doing quite well. Unfortunately I do not see any signs of active infection locally nor systemically at this point which is great news. HASSON, GASPARD ([id number]) 09/12/2020 upon evaluation today patient appears  to be doing well with regard to his leg. He still has a small area that is weeping despite the fact that this has actually previously healed and looks to be doing excellent. I do not see any signs of infection at this time and overall I am extremely pleased with how things seem to be progressing. Hopefully he will be completely healed shortly. Electronic Signature(s) Signed: 09/12/2021 2:42:58 PM By: Lenda Kelp PA-C Entered By: Lenda Kelp on 09/12/2021 14:42:58 Jerry Bad  ([id number]) -------------------------------------------------------------------------------- Physical Exam Details Patient Name: GRAE, CANNATA Date of Service: 09/12/2021 2:15 PM Medical Record Number: [id number] Patient Account Number: [id number] Date of Birth/Sex: October 13, 1950 (71 y.o. M) Treating RN: Angelina Pih Primary Care Provider: Beverely Low Other Clinician: Referring Provider: Beverely Low Treating Provider/Extender: Rowan Blase in Treatment: 24 Constitutional Well-nourished and well-hydrated in no acute distress. Respiratory normal breathing without difficulty. Psychiatric this patient is able to make decisions and demonstrates good insight into disease process. Alert and Oriented x 3. pleasant and cooperative. Notes Upon inspection patient's wound bed actually showed signs of good epithelization at this point. Fortunately I do not see any evidence of anything open and even the blisters that were noted last week sealed up without any complication or concern. Overall I think we are headed in the right direction here. Electronic Signature(s) Signed: 09/12/2021 2:43:30 PM By: Lenda Kelp PA-C Entered By: Lenda Kelp on 09/12/2021 14:43:30 Jerry Bad ([id number]) -------------------------------------------------------------------------------- Physician Orders Details Patient Name: Jerry Bad Date of Service: 09/12/2021 2:15 PM Medical Record Number: [id number] Patient Account Number: [id number] Date of Birth/Sex: 1950-11-12 (71 y.o. M) Treating RN: Angelina Pih Primary Care Provider: Beverely Low Other Clinician: Referring Provider: Beverely Low Treating Provider/Extender: Rowan Blase in Treatment: 39 Verbal / Phone Orders: No Diagnosis Coding ICD-10 Coding Code Description E11.621 Type 2 diabetes mellitus with foot ulcer L97.512 Non-pressure chronic ulcer of other part of right foot with fat layer exposed L97.522  Non-pressure chronic ulcer of other part of left foot with fat layer exposed Z89.421 Acquired absence of other right toe(s) I10 Essential (primary) hypertension Z79.01 Long term (current) use of anticoagulants Z86.718 Personal history of other venous thrombosis and embolism Follow-up Appointments o Return Appointment in 1 week. o Nurse Visit as needed Bathing/ Shower/ Hygiene o May shower with wound dressing protected with water repellent cover or cast protector. o No tub bath. Edema Control - Lymphedema / Segmental Compressive Device / Other Right Lower Extremity o Optional: One layer of unna paste to top of compression wrap (to act as an anchor). o 3 Layer Compression System for Lymphedema. - right o Patient to wear own compression stockings. Remove compression stockings every night before going to bed and put on every morning when getting up. - Bring your compression socks to appt when they arrive in the mail o Elevate, Exercise Daily and Avoid Standing for Long Periods of Time. o Elevate legs to the level of the heart and pump ankles as often as possible o Elevate leg(s) parallel to the floor when sitting. o Compression Pump: Use compression pump on left lower extremity for 60 minutes, twice daily. o Compression Pump: Use compression pump on right lower extremity for 60 minutes, twice daily. Non-Wound Condition o Cleanse affected area with antibacterial soap and water, o Additional non-wound orders/instructions: - apply alginate and ABD for small drainage leaking area around newly healed skin Additional Orders / Instructions o Follow Nutritious Diet and Increase Protein Intake Electronic Signature(s)  Signed: 09/12/2021 3:46:45 PM By: Angelina PihGordon, Caitlin Signed: 09/13/2021 9:05:45 AM By: Lenda KelpStone III, Payslie Mccaig PA-C Entered By: Angelina PihGordon, Caitlin on 09/12/2021 14:32:47 [name], Arth O.  ([id number]) -------------------------------------------------------------------------------- Problem List Details Patient Name: [name], Isreal O. Date of Service: 09/12/2021 2:15 PM Medical Record Number: [id number] Patient Account Number: [id number] Date of Birth/Sex: 05-12-1951 [age] y.o. M) Treating RN: Angelina PihGordon, Caitlin Primary Care Provider: Beverely LowAdamo, Elena Other Clinician: Referring Provider: Beverely LowAdamo, Elena Treating Provider/Extender: Rowan BlaseStone, Leldon Steege Weeks in Treatment: 18 Active Problems ICD-10 Encounter Code Description Active Date MDM Diagnosis E11.621 Type 2 diabetes mellitus with foot ulcer 05/03/2021 No Yes L97.512 Non-pressure chronic ulcer of other part of right foot with fat layer 05/03/2021 No Yes exposed L97.522 Non-pressure chronic ulcer of other part of left foot with fat layer 05/03/2021 No Yes exposed Z89.421 Acquired absence of other right toe(s) 05/03/2021 No Yes I10 Essential (primary) hypertension 05/03/2021 No Yes Z79.01 Long term (current) use of anticoagulants 05/03/2021 No Yes Z86.718 Personal history of other venous thrombosis and embolism 05/03/2021 No Yes Inactive Problems Resolved Problems Electronic Signature(s) Signed: 09/12/2021 2:29:31 PM By: Lenda KelpStone III, Darionna Banke PA-C Entered By: Lenda KelpStone III, Annaliz Aven on 09/12/2021 14:29:30 [name], Kayn O. ([id number]) -------------------------------------------------------------------------------- Progress Note Details Patient Name: [name], Karmelo O. Date of Service: 09/12/2021 2:15 PM Medical Record Number: [id number] Patient Account Number: [id number] Date of Birth/Sex: 05-12-1951 [age] y.o. 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 Has been seen by [facility]iatry [facility] for a number of years it sounds like at least 4 years. With that being said they have tried a split thickness skin graft along with multiple other topical remedies over the time they have been seeing him over the past several years. Unfortunately they are not able to get this closed I did refer him to us for further evaluation and treatment. The patient tells me has been quite sometime since he remembers having an x-ray and again we could not find anything recent when looking through his chart. Fortunately there does not appear to be any signs of active infection systemically which is great news. No fevers, chills, nausea, vomiting, or diarrhea. The patient does have diabetes with his most recent hemoglobin A1c being November 14, 2020 at 9.1. He also does have evidence of lymphedema based on what I am seeing today. 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 05/10/2021 upon evaluation today patient appears to be doing well with regard to his wounds in general. I feel like he is making progress. Fortunately the compression wrap did well for him and overall I am extremely happy with where we stand. No fevers, chills, nausea, vomiting, or diarrhea. 05/17/2021 upon evaluation today patient appears to be doing better in general regard to his wounds. Overall I feel like he is actually signs of good improvement which is great news and I think that we are headed in the right direction. There does  not appear to be any signs of active infection at this time. No fevers, chills,  nausea, vomiting, or diarrhea. 05/24/2021 upon evaluation today patient appears to be doing well with regard to his wounds. He is actually showing signs of improvement this is great news. Its mainly the right foot proximal wound that is going to require debridement today and again I do need to try to clear away some of the lip where he had some epiboly I then work on this week by week but if we get that cleared away I think this will heal much more effectively and quickly. 05/31/2021 upon evaluation today patient appears to be doing well at this point in regard to his wounds. Both are showing signs of improvement and very pleased in that regard. Fortunately there does not appear to be any evidence of active infection systemically which is great news. No fevers, chills, nausea, vomiting, or diarrhea. 06/07/2021 upon evaluation today patient appears to be making excellent progress in regard to his foot ulcers. Both are showing signs of excellent improvement. Fortunately there does not appear to be any evidence of active infection which is great news. No fevers, chills, nausea, vomiting, or diarrhea. 06/21/2021 upon evaluation today patient appears to be doing well with regard to his wound. In fact he is making excellent progress and I am extremely pleased with where things stand today. There does not appear to be any signs of active infection at this time. 06/28/2021 upon evaluation patient's foot ulcer actually is showing signs of good improvement. I am actually extremely pleased with how we stand today and I think that he does require some sharp debridement at this point but nothing in comparison what we have had to do before. Overall I think were headed in the appropriate direction he is very pleased to hear this. 07/05/2021 upon evaluation today patient appears to be doing well with regard to his foot ulcer. In fact this is showing signs of excellent improvement which is great news. No fevers, chills,  nausea, vomiting, or diarrhea. 07/12/2021 upon evaluation today patient appears to be doing well with regard to his wound. He has been tolerating the dressing changes without complication. Fortunately this is showing signs of excellent improvement which is great news. No fevers, chills, nausea, vomiting, or diarrhea. 07/19/2021 upon evaluation today patient appears to be doing well with regard to his leg ulcer in fact this appears to be completely closed at this point. I do not see anything open but I do believe we will get a need to continue to wrap him to help with compression therapy. He needs compression socks and I do not think he is quite ready to put those on yet he also tells me that he needs to order some. 07/29/2021 on evaluation today patient still appears to be doing well in fact everything still is closed although he did swell a bit over the past week. Fortunately there does not appear to be any signs of active infection at this time which is great news. 08/05/2021 upon evaluation today patient appears to be doing well with regard to his wound. In fact this still appears to be healed I do not see any signs of active infection at this time which is great news. No fevers, chills, nausea, vomiting, or diarrhea. 08/19/2021 upon evaluation today patient appears to be doing well with regard to his wound. This is actually looking better it still not really open however the area that is healed still continues to  weep and drain a bit as well or continue to wrap him. I do feel like this region is actually toughen up though quite nicely over the past several weeks. AJ, CRUNKLETON Val Eagle ([id number]) 09/05/2021 upon evaluation today patient appears to be doing well with regard to his wound. In fact this is still closed it has a new skin over top of that he still continues to weep around just from his lymphedema. Were using the compression wrap to keep this under control and overall that seems to be doing  quite well. Unfortunately I do not see any signs of active infection locally nor systemically at this point which is great news. 09/12/2020 upon evaluation today patient appears to be doing well with regard to his leg. He still has a small area that is weeping despite the fact that this has actually previously healed and looks to be doing excellent. I do not see any signs of infection at this time and overall I am extremely pleased with how things seem to be progressing. Hopefully he will be completely healed shortly. Objective Constitutional Well-nourished and well-hydrated in no acute distress. Vitals Time Taken: 2:15 PM, Height: 71 in, Weight: 255 lbs, BMI: 35.6, Temperature: 97.8 F, Pulse: 99 bpm, Respiratory Rate: 18 breaths/min, Blood Pressure: 144/70 mmHg. Respiratory normal breathing without difficulty. Psychiatric this patient is able to make decisions and demonstrates good insight into disease process. Alert and Oriented x 3. pleasant and cooperative. General Notes: Upon inspection patient's wound bed actually showed signs of good epithelization at this point. Fortunately I do not see any evidence of anything open and even the blisters that were noted last week sealed up without any complication or concern. Overall I think we are headed in the right direction here. Other Condition(s) Patient presents with Lymphedema located on the Right Leg. 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 Post procedure Diagnosis Wound #: Same as Pre-Procedure Plan CHRISTYAN, REGER ([id number]) Follow-up Appointments: Return Appointment in 1 week. Nurse Visit as needed Bathing/ Shower/ Hygiene: May shower with wound dressing protected with water repellent cover or cast protector. No tub bath. Edema Control - Lymphedema / Segmental Compressive Device / Other: Optional: One layer of unna paste to top of compression wrap (to act as an anchor). 3 Layer Compression System for Lymphedema. - right Patient to wear own compression stockings. Remove compression stockings every night before going to bed and put on every morning when getting up. - Bring your compression socks to appt when they arrive in the mail Elevate, Exercise Daily and Avoid Standing for Long Periods of Time. Elevate legs to the level of the heart and pump ankles as often as possible Elevate leg(s) parallel to the floor when sitting. Compression Pump: Use compression pump on left lower extremity for 60 minutes, twice daily. Compression Pump: Use compression pump on right lower extremity for 60 minutes, twice daily. Non-Wound Condition: Cleanse affected area with antibacterial soap and water, Additional non-wound orders/instructions: - apply alginate and ABD for small drainage leaking area around newly healed skin Additional Orders /  Instructions: Follow Nutritious Diet and Increase Protein Intake 1. I would recommend that we go ahead and continue with the alginate over the area of weeping this seems to be getting smaller and I think this is working we will continue with the compression wrap as well. 2. I am also can recommend that we continue specifically with the 3 layer compression wrap which I think is doing an awesome job. We will see patient back for reevaluation in 1 week here in the clinic. If anything worsens or changes patient will contact our office for additional recommendations. Electronic  Signature(s) Signed: 09/12/2021 2:44:29 PM By: Lenda Kelp PA-C Entered By: Lenda Kelp on 09/12/2021 14:44:29 Jerry Bad ([id number]) -------------------------------------------------------------------------------- SuperBill Details Patient Name: Jerry Bad Date of Service: 09/12/2021 Medical Record Number: [id number] Patient Account Number: [id number] Date of Birth/Sex: [birth year] (71 y.o. 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

## 2021-09-19 ENCOUNTER — Other Ambulatory Visit: Payer: Self-pay

## 2021-09-19 DIAGNOSIS — E11621 Type 2 diabetes mellitus with foot ulcer: Secondary | ICD-10-CM | POA: Diagnosis not present

## 2021-09-19 NOTE — Progress Notes (Signed)
Jerry, Horn ([id number]) Visit Report for 09/19/2021 Arrival Information Details Patient Name: [name], [name] Date of Service: 09/19/2021 12:45 PM Medical Record Number: [id number] Patient Account Number: [id number] Date of Birth/Sex: June 20, 1951 (71 y.o. M) Treating RN: Angelina Pih Primary Care Lida Berkery: Beverely Low Other Clinician: Referring Jaleigha Deane: Beverely Low Treating Kalah Pflum/Extender: Rowan Blase in Treatment: 19 Visit Information History Since Last Visit Added or deleted any medications: No Patient Arrived: [name] Any new allergies or adverse reactions: No Arrival Time: 12:36 Had a fall or experienced change in No Accompanied By: self activities of daily living that may affect Transfer Assistance: None risk of falls: Patient Identification Verified: Yes Hospitalized since last visit: No Secondary Verification Process Yes Has Dressing in Place as Prescribed: Yes Completed: Has Compression in Place as Prescribed: Yes Patient Has Alerts: Yes Pain Present Now: No Patient Alerts: Patient on Blood Thinner Type II Diabetic 81mg  aspirin non compressible ABI 1/22 Electronic Signature(s) Signed: 09/19/2021 12:57:34 PM By: 09/21/2021 Entered By: Angelina Pih on 09/19/2021 12:52:39 09/21/2021 ([name]) -------------------------------------------------------------------------------- Clinic Level of Care Assessment Details Patient Name: [name] Date of Service: 09/19/2021 12:45 PM Medical Record Number: 09/21/2021 Patient Account Number: [id number] Date of Birth/Sex: 07/17/1951 (71 y.o. M) Treating RN: (75 Primary Care Stacie Templin: Angelina Pih Other Clinician: Referring Jayel Scaduto: Beverely Low Treating Avriana Joo/Extender: Beverely Low in Treatment: 19 Clinic Level of Care Assessment Items TOOL 1 Quantity Score []  - Use when EandM and Procedure is performed on INITIAL visit 0 ASSESSMENTS - Nursing Assessment /  Reassessment []  - General Physical Exam (combine w/ comprehensive assessment (listed just below) when performed on new 0 pt. evals) []  - 0 Comprehensive Assessment (HX, ROS, Risk Assessments, Wounds Hx, etc.) ASSESSMENTS - Wound and Skin Assessment / Reassessment []  - Dermatologic / Skin Assessment (not related to wound area) 0 ASSESSMENTS - Ostomy and/or Continence Assessment and Care []  - Incontinence Assessment and Management 0 []  - 0 Ostomy Care Assessment and Management (repouching, etc.) PROCESS - Coordination of Care []  - Simple Patient / Family Education for ongoing care 0 []  - 0 Complex (extensive) Patient / Family Education for ongoing care []  - 0 Staff obtains 20, Records, Test Results / Process Orders []  - 0 Staff telephones HHA, Nursing Homes / Clarify orders / etc []  - 0 Routine Transfer to another Facility (non-emergent condition) []  - 0 Routine Hospital Admission (non-emergent condition) []  - 0 New Admissions / / Ordering NPWT, Apligraf, etc. []  - 0 Emergency Hospital Admission (emergent condition) PROCESS - Special Needs []  - Pediatric / Minor Patient Management 0 []  - 0 Isolation Patient Management []  - 0 Hearing / Language / Visual special needs []  - 0 Assessment of Community assistance (transportation, D/C planning, etc.) []  - 0 Additional assistance / Altered mentation []  - 0 Support Surface(s) Assessment (bed, cushion, seat, etc.) INTERVENTIONS - Miscellaneous []  - External ear exam 0 []  - 0 Patient Transfer (multiple staff / / Similar devices) []  - 0 Simple Staple / Suture removal (25 or less) []  - 0 Complex Staple / Suture removal (26 or more) []  - 0 Hypo/Hyperglycemic Management (do not check if billed separately) []  - 0 Ankle / Brachial Index (ABI) - do not check if billed separately Has the patient been seen at the hospital within the last three years: Yes Total Score: 0 Level Of Care:  ____ ( ) Electronic Signature(s) Signed: 09/19/2021 12:57:34 PM By: Entered By:  on 09/19/2021 12:54:37 Jerry, Horn ([id number]) -------------------------------------------------------------------------------- Compression Therapy Details Patient Name: [name], [name] Date of Service: 09/19/2021 12:45 PM Medical Record Number: [id number] Patient Account Number: [id number] Date of Birth/Sex: 12-Jun-1951 (71 y.o. M) Treating RN: Angelina Pih Primary Care Lorree Millar: Beverely Low Other Clinician: Referring Makhayla Mcmurry: Beverely Low Treating Channing Savich/Extender: Rowan Blase in Treatment: 19 Compression Therapy Performed for Wound Assessment: NonWound Condition Lymphedema - Right Leg Performed By: Clinician Angelina Pih, RN Compression Type: Three Layer Electronic Signature(s) Signed: 09/19/2021 12:57:34 PM By: Angelina Pih Entered By: Angelina Pih on 09/19/2021 12:52:57 [name] ([id number]) -------------------------------------------------------------------------------- Encounter Discharge Information Details Patient Name: [name], [name] Date of Service: 09/19/2021 12:45 PM Medical Record Number: [id number] Patient Account Number: [id number] Date of Birth/Sex: 09/08/1950 (71 y.o. M) Treating RN: Angelina Pih Primary Care Margues Filippini: Beverely Low Other Clinician: Referring Peggy Monk: Beverely Low Treating Darrel Baroni/Extender: Rowan Blase in Treatment: 19 Encounter Discharge Information Items Discharge Condition: Stable Ambulatory Status: Walker Discharge Destination: Home Transportation: Private Auto Accompanied By: self Schedule Follow-up Appointment: Yes Clinical Summary of Care: Electronic Signature(s) Signed: 09/19/2021 12:57:34 PM By: Angelina Pih Entered By: Angelina Pih on 09/19/2021 12:54:30 [name]  ([id number]) -------------------------------------------------------------------------------- Non-Wound Condition Assessment Details Patient Name: [name] Date of Service: 09/19/2021 12:45 PM Medical Record Number: [id number] Patient Account Number: [id number] Date of Birth/Sex: Oct 14, 1950 (70 y.o. M) Treating RN: Angelina Pih Primary Care Kennan Detter: Beverely Low Other Clinician: Referring Kyilee Gregg: Beverely Low Treating Seymone Forlenza/Extender: Rowan Blase in Treatment: 19 Non-Wound Condition: Condition: Lymphedema Location: Leg Side: Right Notes no changes noted, pt tolerated dressing change without issue Electronic Signature(s) Signed: 09/19/2021 12:57:34 PM By: Angelina Pih Entered By: Angelina Pih on 09/19/2021 12:52:33

## 2021-09-19 NOTE — Progress Notes (Signed)
AUDI, WETTSTEIN ([id number]) Visit Report for 09/19/2021 Physician Orders Details Patient Name: [name], [name]. Date of Service: 09/19/2021 12:45 PM Medical Record Number: [id number] Patient Account Number: [id number] Date of Birth/Sex: 03/25/1951 (71 y.o. M) Treating RN: Angelina Pih Primary Care Provider: Beverely Low Other Clinician: Referring Provider: Beverely Low Treating Provider/Extender: Rowan Blase in Treatment: 52 Verbal / Phone Orders: No Diagnosis Coding Follow-up Appointments o Return Appointment in 1 week. o Nurse Visit as needed Bathing/ Shower/ Hygiene o May shower with wound dressing protected with water repellent cover or cast protector. o No tub bath. Edema Control - Lymphedema / Segmental Compressive Device / Other Right Lower Extremity o Optional: One layer of unna paste to top of compression wrap (to act as an anchor). o 3 Layer Compression System for Lymphedema. - right o Patient to wear own compression stockings. Remove compression stockings every night before going to bed and put on every morning when getting up. - Bring your compression socks to appt when they arrive in the mail o Elevate, Exercise Daily and Avoid Standing for Long Periods of Time. o Elevate legs to the level of the heart and pump ankles as often as possible o Elevate leg(s) parallel to the floor when sitting. o Compression Pump: Use compression pump on left lower extremity for 60 minutes, twice daily. o Compression Pump: Use compression pump on right lower extremity for 60 minutes, twice daily. Non-Wound Condition o Cleanse affected area with antibacterial soap and water, o Additional non-wound orders/instructions: - apply alginate and ABD for small drainage leaking area around newly healed skin Additional Orders / Instructions o Follow Nutritious Diet and Increase Protein Intake Electronic Signature(s) Signed: 09/19/2021 12:57:34 PM By:  Angelina Pih Signed: 09/19/2021 2:08:42 PM By: Lenda Kelp PA-C Entered By: Angelina Pih on 09/19/2021 12:53:56 [name] ([id number]) -------------------------------------------------------------------------------- SuperBill Details Patient Name: [name] Date of Service: 09/19/2021 Medical Record Number: [id number] Patient Account Number: [id number] Date of Birth/Sex: 01-04-1951 (71 y.o. 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

## 2021-09-26 ENCOUNTER — Encounter: Payer: Medicare HMO | Admitting: Physician Assistant

## 2021-09-26 ENCOUNTER — Other Ambulatory Visit: Payer: Self-pay

## 2021-09-26 DIAGNOSIS — E11621 Type 2 diabetes mellitus with foot ulcer: Secondary | ICD-10-CM | POA: Diagnosis not present

## 2021-09-26 NOTE — Progress Notes (Addendum)
[name], Jerry O. ([id number]) Visit Report for 09/26/2021 Chief Complaint Document Details Patient Name: [name], Jerry O. Date of Service: 09/26/2021 2:15 PM Medical Record Number: [id number] Patient Account Number: [id number] Date of Birth/Sex: 10/26/1950 [age] y.o. M) Treating RN: Huel CoventryWoody, Kim Primary Care Provider: Beverely LowAdamo, Elena Other Clinician: Referring Provider: Beverely LowAdamo, Elena Treating Provider/Extender: Rowan BlaseStone, Abby Tucholski Weeks in Treatment: 20 Information Obtained from: Patient Chief Complaint Bilateral foot ulcers Electronic Signature(s) Signed: 09/26/2021 2:05:25 PM By: Lenda KelpStone III, Aryka Coonradt PA-C Entered By: Lenda KelpStone III, Aadhya Bustamante on 09/26/2021 14:05:25 [name], Jerry O. ([id number]) -------------------------------------------------------------------------------- HPI Details Patient Name: [name], Loui O. Date of Service: 09/26/2021 2:15 PM Medical Record Number: [id number] Patient Account Number: [id number] Date of Birth/Sex: 10/26/1950 [age] y.o. 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 Has been seen by [facility] for a number of years it sounds like at least 4 years. With that being said they have tried a split thickness skin graft along with multiple other topical remedies over the time they have been seeing him over the past several years. Unfortunately they are not able to get this closed I did refer him to us for further evaluation and treatment. The patient tells me has been quite sometime since he remembers  having an x-ray and again we could not find anything recent when looking through his chart. Fortunately there does not appear to be any signs of active infection systemically which is great news. No fevers, chills, nausea, vomiting, or diarrhea. The patient does have diabetes with his most recent hemoglobin A1c being November 14, 2020 at 9.1. He also does have evidence of lymphedema based on what I am seeing today. 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 05/10/2021 upon evaluation today patient appears to be doing well with regard to his wounds in general. I feel like he is making progress. Fortunately the compression wrap did well for him and overall I am extremely happy with where we stand. No fevers, chills, nausea, vomiting, or diarrhea. 05/17/2021 upon evaluation today patient appears to be doing better in general regard to his wounds. Overall I feel like he is actually signs of good improvement which is great news and I think that we are headed in the right direction. There does not appear to be any signs of active infection at this time. No fevers, chills, nausea, vomiting, or diarrhea. 05/24/2021 upon evaluation today patient appears to be doing well with regard to his wounds. He is actually showing signs of improvement this is great news. Its mainly the right foot proximal wound that is going to require debridement today and again I do need to try to clear away some of the lip where he had some epiboly I then work on this week by week but if we get that cleared away I think this will heal much more effectively and quickly. 05/31/2021 upon evaluation today patient appears to be doing well at this point in regard to  his wounds. Both are showing signs of improvement and very pleased in that regard. Fortunately there does not appear to be any  evidence of active infection systemically which is great news. No fevers, chills, nausea, vomiting, or diarrhea. 06/07/2021 upon evaluation today patient appears to be making excellent progress in regard to his foot ulcers. Both are showing signs of excellent improvement. Fortunately there does not appear to be any evidence of active infection which is great news. No fevers, chills, nausea, vomiting, or diarrhea. 06/21/2021 upon evaluation today patient appears to be doing well with regard to his wound. In fact he is making excellent progress and I am extremely pleased with where things stand today. There does not appear to be any signs of active infection at this time. 06/28/2021 upon evaluation patient's foot ulcer actually is showing signs of good improvement. I am actually extremely pleased with how we stand today and I think that he does require some sharp debridement at this point but nothing in comparison what we have had to do before. Overall I think were headed in the appropriate direction he is very pleased to hear this. 07/05/2021 upon evaluation today patient appears to be doing well with regard to his foot ulcer. In fact this is showing signs of excellent improvement which is great news. No fevers, chills, nausea, vomiting, or diarrhea. 07/12/2021 upon evaluation today patient appears to be doing well with regard to his wound. He has been tolerating the dressing changes without complication. Fortunately this is showing signs of excellent improvement which is great news. No fevers, chills, nausea, vomiting, or diarrhea. 07/19/2021 upon evaluation today patient appears to be doing well with regard to his leg ulcer in fact this appears to be completely closed at this point. I do not see anything open but I do believe we will get a need to continue to wrap him to help with compression therapy. He needs compression socks and I do not think he is quite ready to put those on yet he also tells me  that he needs to order some. 07/29/2021 on evaluation today patient still appears to be doing well in fact everything still is closed although he did swell a bit over the past week. Fortunately there does not appear to be any signs of active infection at this time which is great news. 08/05/2021 upon evaluation today patient appears to be doing well with regard to his wound. In fact this still appears to be healed I do not see any signs of active infection at this time which is great news. No fevers, chills, nausea, vomiting, or diarrhea. 08/19/2021 upon evaluation today patient appears to be doing well with regard to his wound. This is actually looking better it still not really open however the area that is healed still continues to weep and drain a bit as well or continue to wrap him. I do feel like this region is actually toughen up though quite nicely over the past several weeks. 09/05/2021 upon evaluation today patient appears to be doing well with regard to his wound. In fact this is still closed it has a new skin over top of that he still continues to weep around just from his lymphedema. Were using the compression wrap to keep this under control and overall that seems to be doing quite well. Unfortunately I do not see any signs of active infection locally nor systemically at this point which is great news. [name], Jerry O. ([id number]) 09/12/2020 upon evaluation today patient appears  to be doing well with regard to his leg. He still has a small area that is weeping despite the fact that this has actually previously healed and looks to be doing excellent. I do not see any signs of infection at this time and overall I am extremely pleased with how things seem to be progressing. Hopefully he will be completely healed shortly. 09/26/2021 upon evaluation today patient appears to be doing well with regard to his wound in fact this appears to be completely healed finally. I know this is been a long  time coming and he is very pleased to see that this is doing so much better. I do not see any evidence of active infection locally nor systemically at this time. Electronic Signature(s) Signed: 09/26/2021 5:58:40 PM By: Lenda Kelp PA-C Entered By: Lenda Kelp on 09/26/2021 17:58:40 [name] ([id number]) -------------------------------------------------------------------------------- Physical Exam Details Patient Name: [name], [name] Date of Service: 09/26/2021 2:15 PM Medical Record Number: [id number] Patient Account Number: [id number] Date of Birth/Sex: 03-27-1951 (71 y.o. M) Treating RN: Huel Coventry Primary Care Provider: Beverely Low Other Clinician: Referring Provider: Beverely Low Treating Provider/Extender: Rowan Blase in Treatment: 20 Constitutional Well-nourished and well-hydrated in no acute distress. Respiratory normal breathing without difficulty. Psychiatric this patient is able to make decisions and demonstrates good insight into disease process. Alert and Oriented x 3. pleasant and cooperative. Notes Patient showed complete epithelization everything is doing very well currently. Overall I think he is ready for discharge. Electronic Signature(s) Signed: 09/26/2021 5:58:53 PM By: Lenda Kelp PA-C Entered By: Lenda Kelp on 09/26/2021 17:58:53 [name] ([id number]) -------------------------------------------------------------------------------- Physician Orders Details Patient Name: [name] Date of Service: 09/26/2021 2:15 PM Medical Record Number: [id number] Patient Account Number: [id number] Date of Birth/Sex: 1951/01/25 (71 y.o. 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 Z89.421 Acquired absence of other right toe(s) I10 Essential (primary) hypertension Z79.01 Long term (current) use of anticoagulants Z86.718 Personal history of other venous thrombosis and embolism Discharge From [facility] o Discharge from Wound Care Center Treatment Complete Electronic Signature(s) Signed: 09/26/2021 5:08:09 PM By: Elliot Gurney, BSN, RN, CWS, Kim RN, BSN Signed: 09/26/2021 6:09:14 PM By: Lenda Kelp PA-C Previous Signature: 09/26/2021 5:07:06 PM Version By: Elliot Gurney, BSN, RN, CWS, Kim RN, BSN Entered By: Elliot Gurney, BSN, RN, CWS, Kim on 09/26/2021 17:08:09 [name] ([id number]) -------------------------------------------------------------------------------- Problem List Details Patient Name: PAULMICHAEL, SCHRECK. Date of Service: 09/26/2021 2:15 PM Medical Record Number: [id number] Patient Account Number: [id number] Date of Birth/Sex: Oct 25, 1950 (71 y.o. M) Treating RN: Huel Coventry Primary Care Provider: Beverely Low Other Clinician: Referring Provider: Beverely Low Treating Provider/Extender: Rowan Blase in Treatment: 20 Active Problems ICD-10 Encounter Code Description Active Date MDM Diagnosis E11.621 Type 2 diabetes mellitus with foot ulcer 05/03/2021 No Yes L97.512 Non-pressure chronic ulcer of other part of right foot with fat layer 05/03/2021 No Yes exposed L97.522 Non-pressure chronic ulcer of other part of left foot with fat layer 05/03/2021 No Yes exposed Z89.421 Acquired absence of other right toe(s) 05/03/2021 No Yes I10 Essential (primary) hypertension 05/03/2021 No Yes Z79.01 Long term (  current) use of anticoagulants 05/03/2021 No Yes Z86.718 Personal history of other venous thrombosis and embolism 05/03/2021 No Yes Inactive Problems Resolved Problems Electronic  Signature(s) Signed: 09/26/2021 2:05:21 PM By: Lenda Kelp PA-C Entered By: Lenda Kelp on 09/26/2021 14:05:21 [name] ([id number]) -------------------------------------------------------------------------------- Progress Note Details Patient Name: [name] Date of Service: 09/26/2021 2:15 PM Medical Record Number: [id number] Patient Account Number: [id number] Date of Birth/Sex: 08-17-1951 (71 y.o. 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 Has been seen by [facility] for a number of years it sounds like at least 4 years. With that being said they have tried a split thickness skin graft along with multiple other topical remedies over the time they have been seeing him over the past several years. Unfortunately they are not able to get this closed I did refer him to [place] for further evaluation and treatment. The patient tells me has been quite sometime since he remembers having an x-ray and again we could not find anything recent when looking through his chart. Fortunately there does not appear to be any signs of active infection systemically which is great news. No fevers, chills, nausea, vomiting, or diarrhea. The patient does have diabetes with his most recent hemoglobin A1c being November 14, 2020 at 9.1. He also does have evidence of lymphedema based on what I am seeing today. 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 05/10/2021 upon evaluation today patient appears to be doing well with regard to his wounds in general. I feel like he is making progress. Fortunately the compression wrap did well for him and overall I am extremely happy with where we stand. No fevers, chills, nausea, vomiting, or diarrhea. 05/17/2021 upon evaluation today patient appears to be doing better in general regard to his wounds. Overall I feel like he is actually signs of good improvement which is great news and I think that we are headed in the right direction. There does not appear to be any signs of active infection at this time. No fevers, chills, nausea, vomiting, or diarrhea. 05/24/2021 upon evaluation today patient appears to be doing well with regard to his wounds. He is actually showing signs of improvement this is great news. Its mainly the right foot proximal wound that is going to require debridement today and again I do need to try to clear away some of the lip where he had some epiboly I then work on this week by week but if we get that cleared away I think this will heal much more effectively and quickly. 05/31/2021 upon evaluation today patient appears to be doing well at this point in regard to his wounds. Both are showing signs of improvement and very pleased in that regard. Fortunately there does not appear to be any evidence of active infection systemically which is great news. No fevers, chills, nausea, vomiting,  or diarrhea. 06/07/2021 upon evaluation today patient appears to be making excellent progress in regard to his foot ulcers. Both are showing signs of excellent improvement. Fortunately there does not appear to be any evidence of active infection which is great news. No fevers, chills, nausea, vomiting, or diarrhea. 06/21/2021 upon evaluation today  patient appears to be doing well with regard to his wound. In fact he is making excellent progress and I am extremely pleased with where things stand today. There does not appear to be any signs of active infection at this time. 06/28/2021 upon evaluation patient's foot ulcer actually is showing signs of good improvement. I am actually extremely pleased with how we stand today and I think that he does require some sharp debridement at this point but nothing in comparison what we have had to do before. Overall I think were headed in the appropriate direction he is very pleased to hear this. 07/05/2021 upon evaluation today patient appears to be doing well with regard to his foot ulcer. In fact this is showing signs of excellent improvement which is great news. No fevers, chills, nausea, vomiting, or diarrhea. 07/12/2021 upon evaluation today patient appears to be doing well with regard to his wound. He has been tolerating the dressing changes without complication. Fortunately this is showing signs of excellent improvement which is great news. No fevers, chills, nausea, vomiting, or diarrhea. 07/19/2021 upon evaluation today patient appears to be doing well with regard to his leg ulcer in fact this appears to be completely closed at this point. I do not see anything open but I do believe we will get a need to continue to wrap him to help with compression therapy. He needs compression socks and I do not think he is quite ready to put those on yet he also tells me that he needs to order some. 07/29/2021 on evaluation today patient still appears to be doing well in fact everything still is closed although he did swell a bit over the past week. Fortunately there does not appear to be any signs of active infection at this time which is great news. 08/05/2021 upon evaluation today patient appears to be doing well with regard to his wound. In fact this still appears to be healed I do not see any signs of active  infection at this time which is great news. No fevers, chills, nausea, vomiting, or diarrhea. 08/19/2021 upon evaluation today patient appears to be doing well with regard to his wound. This is actually looking better it still not really open however the area that is healed still continues to weep and drain a bit as well or continue to wrap him. I do feel like this region is actually toughen up though quite nicely over the past several weeks. [name], [name] [place] ([id number]) 09/05/2021 upon evaluation today patient appears to be doing well with regard to his wound. In fact this is still closed it has a new skin over top of that he still continues to weep around just from his lymphedema. Were using the compression wrap to keep this under control and overall that seems to be doing quite well. Unfortunately I do not see any signs of active infection locally nor systemically at this point which is great news. 09/12/2020 upon evaluation today patient appears to be doing well with regard to his leg. He still has a small area that is weeping despite the fact that this has actually previously healed and looks to be doing excellent. I do not  see any signs of infection at this time and overall I am extremely pleased with how things seem to be progressing. Hopefully he will be completely healed shortly. 09/26/2021 upon evaluation today patient appears to be doing well with regard to his wound in fact this appears to be completely healed finally. I know this is been a long time coming and he is very pleased to see that this is doing so much better. I do not see any evidence of active infection locally nor systemically at this time. Objective Constitutional Well-nourished and well-hydrated in no acute distress. Vitals Time Taken: 2:46 PM, Height: 71 in, Weight: 255 lbs, BMI: 35.6, Temperature: 98.1 F, Pulse: 83 bpm, Respiratory Rate: 16 breaths/min, Blood Pressure: 157/75 mmHg. Respiratory normal breathing  without difficulty. Psychiatric this patient is able to make decisions and demonstrates good insight into disease process. Alert and Oriented x 3. pleasant and cooperative. General Notes: Patient showed complete epithelization everything is doing very well currently. Overall I think he is ready for discharge. 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 Would recommend currently we discontinue the patient from wound care service and I am going to go ahead and see about getting him into his own compression for today he did not have that with him so we will get a put him in [facility]. 2. I am also can recommend he should continue to elevate his legs much as possible to help with edema control. 3. If anything changes he should let me know. We will see the patient back for follow-up visit as needed. [name], [name] ([id number]) Electronic Signature(s) Signed: 09/26/2021 5:59:17 PM By: Lenda Kelp PA-C Entered By: Lenda Kelp on 09/26/2021 17:59:17 [name], [name] ([id number]) -------------------------------------------------------------------------------- SuperBill Details Patient Name: [name] Date of Service: 09/26/2021 Medical Record Number: [id number] Patient Account Number: [id number] Date of Birth/Sex: 1950-11-28 (71 y.o. 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.522 Non-pressure chronic ulcer of other part of left foot with fat layer exposed Z89.421 Acquired absence of other right toe(s) I10 Essential (primary) hypertension Z79.01 Long term (current) use of anticoagulants Z86.718 Personal history of other venous thrombosis and embolism Facility Procedures CPT4 Code: 08657846 Description: [phone] - WOUND CARE VISIT-LEV 2 EST PT Modifier: Quantity: 1 Physician Procedures CPT4 Code: [id number] Description: 99213 - WC PHYS LEVEL 3 - EST PT Modifier: Quantity: 1 CPT4 Code: Description: ICD-10 Diagnosis Description E11.621 Type 2 diabetes mellitus with foot ulcer L97.512 Non-pressure chronic ulcer of other part of right foot with fat layer e L97.522 Non-pressure chronic ulcer of other part of left foot with fat layer ex  Z89.421 Acquired absence of other right toe(s) Modifier: xposed posed Quantity: Electronic Signature(s) Signed: 09/26/2021 6:00:21 PM By: Lenda Kelp PA-C Previous Signature: 09/26/2021 5:09:12 PM Version By: Elliot Gurney, BSN, RN, CWS, Kim RN, BSN Entered By: Lenda Kelp  on 09/26/2021 18:00:21

## 2021-09-26 NOTE — Progress Notes (Addendum)
TAISON, CELANI ([id number]) Visit Report for 09/26/2021 Arrival Information Details Patient Name: [name], [name] Date of Service: 09/26/2021 2:15 PM Medical Record Number: [id number] Patient Account Number: [id number] Date of Birth/Sex: 04-17-1951 (71 y.o. M) Treating RN: Huel Coventry Primary Care Starnisha Batrez: Beverely Low Other Clinician: Referring Danashia Landers: Beverely Low Treating Jake Fuhrmann/Extender: Rowan Blase in Treatment: 20 Visit Information History Since Last Visit Added or deleted any medications: No Patient Arrived: Walker Has Dressing in Place as Prescribed: Yes Arrival Time: 14:42 Has Compression in Place as Prescribed: Yes Accompanied By: self Pain Present Now: No Transfer Assistance: None Patient Identification Verified: Yes Secondary Verification Process Completed: Yes Patient Has Alerts: Yes Patient Alerts: Patient on Blood Thinner Type II Diabetic 81mg  aspirin non compressible ABI 1/22 Electronic Signature(s) Signed: 09/26/2021 5:13:08 PM By: 09/28/2021, BSN, RN, CWS, Kim RN, BSN Entered By: Elliot Gurney, BSN, RN, CWS, Kim on 09/26/2021 14:46:16 09/28/2021 ([name]) -------------------------------------------------------------------------------- Clinic Level of Care Assessment Details Patient Name: [name] Date of Service: 09/26/2021 2:15 PM Medical Record Number: 09/28/2021 Patient Account Number: [id number] Date of Birth/Sex: 1951/04/18 (71 y.o. M) Treating RN: (75 Primary Care Cicley Ganesh: Huel Coventry Other Clinician: Referring Arvis Miguez: Beverely Low Treating Dozier Berkovich/Extender: Beverely Low in Treatment: 20 Clinic Level of Care Assessment Items TOOL 4 Quantity Score []  - Use when only an EandM is performed on FOLLOW-UP visit 0 ASSESSMENTS - Nursing Assessment / Reassessment X - Reassessment of Co-morbidities (includes updates in patient status) 1 10 X- 1 5 Reassessment of Adherence to Treatment Plan ASSESSMENTS - Wound and  Skin Assessment / Reassessment X - Simple Wound Assessment / Reassessment - one wound 1 5 []  - 0 Complex Wound Assessment / Reassessment - multiple wounds []  - 0 Dermatologic / Skin Assessment (not related to wound area) ASSESSMENTS - Focused Assessment []  - Circumferential Edema Measurements - multi extremities 0 []  - 0 Nutritional Assessment / Counseling / Intervention []  - 0 Lower Extremity Assessment (monofilament, tuning fork, pulses) []  - 0 Peripheral Arterial Disease Assessment (using hand held doppler) ASSESSMENTS - Ostomy and/or Continence Assessment and Care []  - Incontinence Assessment and Management 0 []  - 0 Ostomy Care Assessment and Management (repouching, etc.) PROCESS - Coordination of Care X - Simple Patient / Family Education for ongoing care 1 15 []  - 0 Complex (extensive) Patient / Family Education for ongoing care []  - 0 Staff obtains Rowan Blase, Records, Test Results / Process Orders []  - 0 Staff telephones HHA, Nursing Homes / Clarify orders / etc []  - 0 Routine Transfer to another Facility (non-emergent condition) []  - 0 Routine Hospital Admission (non-emergent condition) []  - 0 New Admissions / / Ordering NPWT, Apligraf, etc. []  - 0 Emergency Hospital Admission (emergent condition) X- 1 10 Simple Discharge Coordination []  - 0 Complex (extensive) Discharge Coordination PROCESS - Special Needs []  - Pediatric / Minor Patient Management 0 []  - 0 Isolation Patient Management []  - 0 Hearing / Language / Visual special needs []  - 0 Assessment of Community assistance (transportation, D/C planning, etc.) []  - 0 Additional assistance / Altered mentation []  - 0 Support Surface(s) Assessment (bed, cushion, seat, etc.) INTERVENTIONS - Wound Cleansing / Measurement ABDO, DENAULT ( ) []  - 0 Simple Wound Cleansing - one wound []  - 0 Complex Wound Cleansing - multiple wounds X- 1 5 Wound Imaging (photographs - any  number of wounds) []  - 0 Wound Tracing (instead of photographs) []  - 0 Simple Wound Measurement - one wound []  - 0 Complex  Wound Measurement - multiple wounds INTERVENTIONS - Wound Dressings []  - Small Wound Dressing one or multiple wounds 0 []  - 0 Medium Wound Dressing one or multiple wounds []  - 0 Large Wound Dressing one or multiple wounds []  - 0 Application of Medications - topical []  - 0 Application of Medications - injection INTERVENTIONS - Miscellaneous []  - External ear exam 0 []  - 0 Specimen Collection (cultures, biopsies, blood, body fluids, etc.) []  - 0 Specimen(s) / Culture(s) sent or taken to Lab for analysis []  - 0 Patient Transfer (multiple staff / Nurse, adultHoyer Lift / Similar devices) []  - 0 Simple Staple / Suture removal (25 or less) []  - 0 Complex Staple / Suture removal (26 or more) []  - 0 Hypo / Hyperglycemic Management (close monitor of Blood Glucose) []  - 0 Ankle / Brachial Index (ABI) - do not check if billed separately X- 1 5 Vital Signs Has the patient been seen at the hospital within the last three years: Yes Total Score: 55 Level Of Care: New/Established - Level 2 Electronic Signature(s) Signed: 09/26/2021 5:13:08 PM By: Elliot GurneyWoody, BSN, RN, CWS, Kim RN, BSN Entered By: Elliot GurneyWoody, BSN, RN, CWS, Kim on 09/26/2021 17:09:02 [name], Yaniv O. ([id number]) -------------------------------------------------------------------------------- Encounter Discharge Information Details Patient Name: [name], Jerry O. Date of Service: 09/26/2021 2:15 PM Medical Record Number: [id number] Patient Account Number: [id number] Date of Birth/Sex: 04-02-1951 [age] y.o. M) Treating RN: Huel CoventryWoody, Kim Primary Care Kerston Landeck: Beverely LowAdamo, Elena Other Clinician: Referring Sasha Rogel: Beverely LowAdamo, Elena Treating Dellis Voght/Extender: Rowan BlaseStone, Hoyt Weeks in Treatment: 20 Encounter Discharge Information Items Discharge Condition: Stable Ambulatory Status: Ambulatory Discharge Destination:  Home Transportation: Private Auto Accompanied By: self Schedule Follow-up Appointment: Yes Clinical Summary of Care: Electronic Signature(s) Signed: 09/26/2021 5:10:24 PM By: Elliot GurneyWoody, BSN, RN, CWS, Kim RN, BSN Entered By: Elliot GurneyWoody, BSN, RN, CWS, Kim on 09/26/2021 17:10:24 [name], Elvis O. ([id number]) -------------------------------------------------------------------------------- Lower Extremity Assessment Details Patient Name: [name], Jerry O. Date of Service: 09/26/2021 2:15 PM Medical Record Number: [id number] Patient Account Number: [id number] Date of Birth/Sex: 04-02-1951 [age] y.o. M) Treating RN: Huel CoventryWoody, Kim Primary Care Boyde Grieco: Beverely LowAdamo, Elena Other Clinician: Referring Dicy Smigel: Beverely LowAdamo, Elena Treating Lanaya Bennis/Extender: Rowan BlaseStone, Hoyt Weeks in Treatment: 20 Edema Assessment Assessed: [Left: No] [Right: No] [Left: Edema] [Right: :] Calf Left: Right: Point of Measurement: 31 cm From Medial Instep 44.5 cm Ankle Left: Right: Point of Measurement: 10 cm From Medial Instep 27 cm Vascular Assessment Pulses: Dorsalis Pedis Palpable: [Right:Yes] Electronic Signature(s) Signed: 09/26/2021 5:13:08 PM By: Elliot GurneyWoody, BSN, RN, CWS, Kim RN, BSN Entered By: Elliot GurneyWoody, BSN, RN, CWS, Kim on 09/26/2021 14:51:46 [name], Markeise O. ([id number]) -------------------------------------------------------------------------------- Multi Wound Chart Details Patient Name: [name], Jerry O. Date of Service: 09/26/2021 2:15 PM Medical Record Number: [id number] Patient Account Number: [id number] Date of Birth/Sex: 04-02-1951 [age] y.o. M) Treating RN: Huel CoventryWoody, Kim Primary Care Cillian Gwinner: Beverely LowAdamo, Elena Other Clinician: Referring Shaquil Aldana: Beverely LowAdamo, Elena Treating Jentri Aye/Extender: Rowan BlaseStone, Hoyt Weeks in Treatment: 20 Vital Signs Height(in): 71 Pulse(bpm): 83 Weight(lbs): 255 Blood Pressure(mmHg): 157/75 Body Mass Index(BMI): 35.6 Temperature(F): 98.1 Respiratory Rate(breaths/min): 16 Wound Assessments Treatment  Notes Electronic Signature(s) Signed: 09/26/2021 5:06:39 PM By: Elliot GurneyWoody, BSN, RN, CWS, Kim RN, BSN Entered By: Elliot GurneyWoody, BSN, RN, CWS, Kim on 09/26/2021 17:06:39 [name], Demarrion O. ([id number]) -------------------------------------------------------------------------------- Multi-Disciplinary Care Plan Details Patient Name: [name], Jerry O. Date of Service: 09/26/2021 2:15 PM Medical Record Number: [id number] Patient Account Number: [id number] Date of Birth/Sex: 04-02-1951 [age] y.o. M) Treating RN: Huel CoventryWoody, Kim Primary Care Kaetlin Bullen: Beverely LowAdamo, Elena Other Clinician: Referring Skyy Nilan: Beverely LowAdamo, Elena Treating Shannara Winbush/Extender: Allen DerryStone, Hoyt  Weeks in Treatment: 20 Active Inactive Electronic Signature(s) Signed: 09/26/2021 5:13:08 PM By: Elliot Gurney, BSN, RN, CWS, Kim RN, BSN Entered By: Elliot Gurney, BSN, RN, CWS, Kim on 09/26/2021 15:20:13 [name] ([id number]) -------------------------------------------------------------------------------- Pain Assessment Details Patient Name: [name], [name] Date of Service: 09/26/2021 2:15 PM Medical Record Number: [id number] Patient Account Number: [id number] Date of Birth/Sex: 03-21-1951 (71 y.o. M) Treating RN: Huel Coventry Primary Care Wanita Derenzo: Beverely Low Other Clinician: Referring Emri Sample: Beverely Low Treating Damarious Holtsclaw/Extender: Rowan Blase in Treatment: 20 Active Problems Location of Pain Severity and Description of Pain Patient Has Paino No Site Locations Pain Management and Medication Current Pain Management: Notes Patient denies pain at this time. Electronic Signature(s) Signed: 09/26/2021 5:13:08 PM By: Elliot Gurney, BSN, RN, CWS, Kim RN, BSN Entered By: Elliot Gurney, BSN, RN, CWS, Kim on 09/26/2021 14:47:00 [name] ([id number]) -------------------------------------------------------------------------------- Patient/Caregiver Education Details Patient Name: [name], [name] Date of Service: 09/26/2021 2:15 PM Medical Record Number:  [id number] Patient Account Number: [id number] Date of Birth/Gender: Dec 11, 1950 (71 y.o. M) Treating RN: Huel Coventry Primary Care Physician: Beverely Low Other Clinician: Referring Physician: Beverely Low Treating Physician/Extender: Rowan Blase in Treatment: 20 Education Assessment Education Provided To: Patient Education Topics Provided Venous: Handouts: Controlling Swelling with Compression Stockings Methods: Demonstration, Explain/Verbal Responses: State content correctly Electronic Signature(s) Signed: 09/26/2021 5:13:08 PM By: Elliot Gurney, BSN, RN, CWS, Kim RN, BSN Entered By: Elliot Gurney, BSN, RN, CWS, Kim on 09/26/2021 17:09:36 [name] ([id number]) -------------------------------------------------------------------------------- Vitals Details Patient Name: [name] Date of Service: 09/26/2021 2:15 PM Medical Record Number: [id number] Patient Account Number: [id number] Date of Birth/Sex: Jul 26, 1951 (71 y.o. M) Treating RN: Huel Coventry Primary Care Ludmila Ebarb: Beverely Low Other Clinician: Referring Kiante Petrovich: Beverely Low Treating Araf Clugston/Extender: Rowan Blase in Treatment: 20 Vital Signs Time Taken: 14:46 Temperature (F): 98.1 Height (in): 71 Pulse (bpm): 83 Weight (lbs): 255 Respiratory Rate (breaths/min): 16 Body Mass Index (BMI): 35.6 Blood Pressure (mmHg): 157/75 Reference Range: 80 - 120 mg / dl Electronic Signature(s) Signed: 09/26/2021 5:13:08 PM By: Elliot Gurney, BSN, RN, CWS, Kim RN, BSN Entered By: Elliot Gurney, BSN, RN, CWS, Kim on 09/26/2021 14:46:45

## 2021-10-10 ENCOUNTER — Encounter: Payer: Medicare HMO | Admitting: Physician Assistant

## 2021-10-17 ENCOUNTER — Encounter: Payer: Medicare HMO | Admitting: Physician Assistant

## 2021-10-24 ENCOUNTER — Encounter: Payer: Medicare HMO | Admitting: Physician Assistant

## 2022-01-03 ENCOUNTER — Inpatient Hospital Stay: Payer: Medicare HMO

## 2022-01-03 ENCOUNTER — Encounter: Payer: Medicare HMO | Attending: Physician Assistant | Admitting: Physician Assistant

## 2022-01-03 ENCOUNTER — Other Ambulatory Visit: Payer: Self-pay

## 2022-01-03 ENCOUNTER — Encounter: Payer: Self-pay | Admitting: Intensive Care

## 2022-01-03 ENCOUNTER — Emergency Department: Payer: Medicare HMO

## 2022-01-03 ENCOUNTER — Inpatient Hospital Stay
Admission: EM | Admit: 2022-01-03 | Discharge: 2022-01-08 | DRG: 240 | Disposition: A | Discharge status: Skilled Nursing Facility | Payer: Medicare HMO | Source: Ambulatory Visit | Attending: Internal Medicine | Admitting: Internal Medicine

## 2022-01-03 DIAGNOSIS — Z89421 Acquired absence of other right toe(s): Secondary | ICD-10-CM

## 2022-01-03 DIAGNOSIS — Z86718 Personal history of other venous thrombosis and embolism: Secondary | ICD-10-CM | POA: Diagnosis not present

## 2022-01-03 DIAGNOSIS — L97519 Non-pressure chronic ulcer of other part of right foot with unspecified severity: Secondary | ICD-10-CM | POA: Diagnosis present

## 2022-01-03 DIAGNOSIS — E1140 Type 2 diabetes mellitus with diabetic neuropathy, unspecified: Secondary | ICD-10-CM | POA: Diagnosis present

## 2022-01-03 DIAGNOSIS — E7800 Pure hypercholesterolemia, unspecified: Secondary | ICD-10-CM | POA: Diagnosis present

## 2022-01-03 DIAGNOSIS — B952 Enterococcus as the cause of diseases classified elsewhere: Secondary | ICD-10-CM | POA: Diagnosis present

## 2022-01-03 DIAGNOSIS — Z79899 Other long term (current) drug therapy: Secondary | ICD-10-CM

## 2022-01-03 DIAGNOSIS — E785 Hyperlipidemia, unspecified: Secondary | ICD-10-CM | POA: Diagnosis not present

## 2022-01-03 DIAGNOSIS — F0390 Unspecified dementia without behavioral disturbance: Secondary | ICD-10-CM | POA: Diagnosis present

## 2022-01-03 DIAGNOSIS — Z8249 Family history of ischemic heart disease and other diseases of the circulatory system: Secondary | ICD-10-CM

## 2022-01-03 DIAGNOSIS — Z6839 Body mass index (BMI) 39.0-39.9, adult: Secondary | ICD-10-CM

## 2022-01-03 DIAGNOSIS — Z833 Family history of diabetes mellitus: Secondary | ICD-10-CM

## 2022-01-03 DIAGNOSIS — Z961 Presence of intraocular lens: Secondary | ICD-10-CM | POA: Diagnosis present

## 2022-01-03 DIAGNOSIS — Z20822 Contact with and (suspected) exposure to covid-19: Secondary | ICD-10-CM | POA: Diagnosis present

## 2022-01-03 DIAGNOSIS — M86171 Other acute osteomyelitis, right ankle and foot: Secondary | ICD-10-CM | POA: Diagnosis present

## 2022-01-03 DIAGNOSIS — N179 Acute kidney failure, unspecified: Secondary | ICD-10-CM | POA: Diagnosis present

## 2022-01-03 DIAGNOSIS — L089 Local infection of the skin and subcutaneous tissue, unspecified: Secondary | ICD-10-CM | POA: Diagnosis not present

## 2022-01-03 DIAGNOSIS — Z87891 Personal history of nicotine dependence: Secondary | ICD-10-CM | POA: Diagnosis not present

## 2022-01-03 DIAGNOSIS — Z9842 Cataract extraction status, left eye: Secondary | ICD-10-CM

## 2022-01-03 DIAGNOSIS — L03115 Cellulitis of right lower limb: Secondary | ICD-10-CM | POA: Diagnosis present

## 2022-01-03 DIAGNOSIS — M009 Pyogenic arthritis, unspecified: Secondary | ICD-10-CM | POA: Diagnosis present

## 2022-01-03 DIAGNOSIS — N1831 Chronic kidney disease, stage 3a: Secondary | ICD-10-CM | POA: Diagnosis present

## 2022-01-03 DIAGNOSIS — I739 Peripheral vascular disease, unspecified: Secondary | ICD-10-CM | POA: Diagnosis not present

## 2022-01-03 DIAGNOSIS — I10 Essential (primary) hypertension: Secondary | ICD-10-CM | POA: Diagnosis not present

## 2022-01-03 DIAGNOSIS — B951 Streptococcus, group B, as the cause of diseases classified elsewhere: Secondary | ICD-10-CM | POA: Diagnosis present

## 2022-01-03 DIAGNOSIS — Z7984 Long term (current) use of oral hypoglycemic drugs: Secondary | ICD-10-CM

## 2022-01-03 DIAGNOSIS — E1129 Type 2 diabetes mellitus with other diabetic kidney complication: Secondary | ICD-10-CM | POA: Diagnosis present

## 2022-01-03 DIAGNOSIS — E875 Hyperkalemia: Secondary | ICD-10-CM | POA: Diagnosis present

## 2022-01-03 DIAGNOSIS — E1169 Type 2 diabetes mellitus with other specified complication: Secondary | ICD-10-CM | POA: Diagnosis present

## 2022-01-03 DIAGNOSIS — E11621 Type 2 diabetes mellitus with foot ulcer: Secondary | ICD-10-CM | POA: Diagnosis present

## 2022-01-03 DIAGNOSIS — E1122 Type 2 diabetes mellitus with diabetic chronic kidney disease: Secondary | ICD-10-CM | POA: Diagnosis present

## 2022-01-03 DIAGNOSIS — D509 Iron deficiency anemia, unspecified: Secondary | ICD-10-CM | POA: Diagnosis present

## 2022-01-03 DIAGNOSIS — M869 Osteomyelitis, unspecified: Secondary | ICD-10-CM

## 2022-01-03 DIAGNOSIS — E1152 Type 2 diabetes mellitus with diabetic peripheral angiopathy with gangrene: Principal | ICD-10-CM | POA: Diagnosis present

## 2022-01-03 DIAGNOSIS — I129 Hypertensive chronic kidney disease with stage 1 through stage 4 chronic kidney disease, or unspecified chronic kidney disease: Secondary | ICD-10-CM | POA: Diagnosis present

## 2022-01-03 DIAGNOSIS — R7881 Bacteremia: Secondary | ICD-10-CM | POA: Diagnosis present

## 2022-01-03 DIAGNOSIS — I890 Lymphedema, not elsewhere classified: Secondary | ICD-10-CM | POA: Diagnosis present

## 2022-01-03 DIAGNOSIS — E872 Acidosis, unspecified: Secondary | ICD-10-CM | POA: Diagnosis present

## 2022-01-03 DIAGNOSIS — E11628 Type 2 diabetes mellitus with other skin complications: Secondary | ICD-10-CM | POA: Diagnosis present

## 2022-01-03 DIAGNOSIS — E669 Obesity, unspecified: Secondary | ICD-10-CM | POA: Diagnosis present

## 2022-01-03 DIAGNOSIS — Z794 Long term (current) use of insulin: Secondary | ICD-10-CM

## 2022-01-03 DIAGNOSIS — M868X7 Other osteomyelitis, ankle and foot: Secondary | ICD-10-CM | POA: Diagnosis not present

## 2022-01-03 LAB — COMPREHENSIVE METABOLIC PANEL
ALT: 12 U/L (ref 0–44)
AST: 19 U/L (ref 15–41)
Albumin: 3.7 g/dL (ref 3.5–5.0)
Alkaline Phosphatase: 74 U/L (ref 38–126)
Anion gap: 12 (ref 5–15)
BUN: 29 mg/dL — ABNORMAL HIGH (ref 8–23)
CO2: 18 mmol/L — ABNORMAL LOW (ref 22–32)
Calcium: 9.1 mg/dL (ref 8.9–10.3)
Chloride: 106 mmol/L (ref 98–111)
Creatinine, Ser: 1.92 mg/dL — ABNORMAL HIGH (ref 0.61–1.24)
GFR, Estimated: 37 mL/min — ABNORMAL LOW (ref >60)
Glucose, Bld: 133 mg/dL — ABNORMAL HIGH (ref 70–99)
Potassium: 5.2 mmol/L — ABNORMAL HIGH (ref 3.5–5.1)
Sodium: 136 mmol/L (ref 135–145)
Total Bilirubin: 0.5 mg/dL (ref 0.3–1.2)
Total Protein: 8.8 g/dL — ABNORMAL HIGH (ref 6.5–8.1)

## 2022-01-03 LAB — GLUCOSE, CAPILLARY
Glucose-Capillary: 227 mg/dL — ABNORMAL HIGH (ref 70–99)
Glucose-Capillary: 166 mg/dL — ABNORMAL HIGH (ref 70–99)

## 2022-01-03 LAB — SEDIMENTATION RATE: Sed Rate: 72 mm/hr — ABNORMAL HIGH (ref 0–20)

## 2022-01-03 LAB — CBC WITH DIFFERENTIAL/PLATELET
Abs Immature Granulocytes: 0.02 10*3/uL (ref 0.00–0.07)
Basophils Absolute: 0 10*3/uL (ref 0.0–0.1)
Basophils Relative: 1 %
Eosinophils Absolute: 0.2 10*3/uL (ref 0.0–0.5)
Eosinophils Relative: 3 %
HCT: 34.4 % — ABNORMAL LOW (ref 39.0–52.0)
Hemoglobin: 11 g/dL — ABNORMAL LOW (ref 13.0–17.0)
Immature Granulocytes: 0 %
Lymphocytes Relative: 11 %
Lymphs Abs: 0.7 10*3/uL (ref 0.7–4.0)
MCH: 28.4 pg (ref 26.0–34.0)
MCHC: 32 g/dL (ref 30.0–36.0)
MCV: 88.7 fL (ref 80.0–100.0)
Monocytes Absolute: 0.4 10*3/uL (ref 0.1–1.0)
Monocytes Relative: 7 %
Neutro Abs: 5 10*3/uL (ref 1.7–7.7)
Neutrophils Relative %: 78 %
Platelets: 430 10*3/uL — ABNORMAL HIGH (ref 150–400)
RBC: 3.88 MIL/uL — ABNORMAL LOW (ref 4.22–5.81)
RDW: 14.6 % (ref 11.5–15.5)
WBC: 6.3 10*3/uL (ref 4.0–10.5)
nRBC: 0 % (ref 0.0–0.2)

## 2022-01-03 LAB — LACTIC ACID, PLASMA
Lactic Acid, Venous: 2.8 mmol/L (ref 0.5–1.9)
Lactic Acid, Venous: 2.5 mmol/L (ref 0.5–1.9)
Lactic Acid, Venous: 1.2 mmol/L (ref 0.5–1.9)

## 2022-01-03 LAB — RESP PANEL BY RT-PCR (FLU A&B, COVID) ARPGX2
Influenza A by PCR: NEGATIVE
Influenza B by PCR: NEGATIVE
SARS Coronavirus 2 by RT PCR: NEGATIVE

## 2022-01-03 LAB — CBG MONITORING, ED: Glucose-Capillary: 122 mg/dL — ABNORMAL HIGH (ref 70–99)

## 2022-01-03 LAB — C-REACTIVE PROTEIN: CRP: 2 mg/dL — ABNORMAL HIGH (ref <1.0)

## 2022-01-03 LAB — SURGICAL PCR SCREEN
MRSA, PCR: NEGATIVE
Staphylococcus aureus: NEGATIVE

## 2022-01-03 LAB — APTT: aPTT: 31 seconds (ref 24–36)

## 2022-01-03 LAB — PROCALCITONIN: Procalcitonin: <0.1 ng/mL

## 2022-01-03 LAB — PROTIME-INR
INR: 1.1 (ref 0.8–1.2)
Prothrombin Time: 14.5 seconds (ref 11.4–15.2)

## 2022-01-03 MED ORDER — DM-GUAIFENESIN ER 30-600 MG PO TB12
1.0000 | ORAL_TABLET | Freq: Two times a day (BID) | ORAL | Status: DC | PRN
  Filled 2022-01-03: qty 1

## 2022-01-03 MED ORDER — METRONIDAZOLE 500 MG/100ML IV SOLN
500.0000 mg | Freq: Two times a day (BID) | INTRAVENOUS | Status: DC
  Administered 2022-01-03 – 2022-01-07 (×8): 500 mg via INTRAVENOUS
  Filled 2022-01-03 (×8): qty 100

## 2022-01-03 MED ORDER — DOCUSATE SODIUM 100 MG PO CAPS: 100.0000 mg | ORAL_CAPSULE | Freq: Two times a day (BID) | ORAL | Status: DC | PRN

## 2022-01-03 MED ORDER — VANCOMYCIN VARIABLE DOSE PER UNSTABLE RENAL FUNCTION (PHARMACIST DOSING)
Status: DC
Start: 2022-01-03 — End: 2022-01-04

## 2022-01-03 MED ORDER — ONDANSETRON HCL 4 MG/2ML IJ SOLN
4.0000 mg | Freq: Three times a day (TID) | INTRAMUSCULAR | Status: DC | PRN
  Administered 2022-01-05: 4 mg via INTRAVENOUS
  Filled 2022-01-03: qty 2

## 2022-01-03 MED ORDER — LACTATED RINGERS IV BOLUS
1000.0000 mL | Freq: Once | INTRAVENOUS | Status: AC
  Administered 2022-01-03: 1000 mL via INTRAVENOUS

## 2022-01-03 MED ORDER — INSULIN ASPART 100 UNIT/ML IJ SOLN
0.0000 [IU] | Freq: Three times a day (TID) | INTRAMUSCULAR | Status: DC
  Administered 2022-01-03: 3 [IU] via SUBCUTANEOUS
  Administered 2022-01-04: 1 [IU] via SUBCUTANEOUS
  Administered 2022-01-04 – 2022-01-06 (×7): 2 [IU] via SUBCUTANEOUS
  Administered 2022-01-07 (×2): 3 [IU] via SUBCUTANEOUS
  Administered 2022-01-07: 1 [IU] via SUBCUTANEOUS
  Administered 2022-01-08: 2 [IU] via SUBCUTANEOUS
  Administered 2022-01-08: 1 [IU] via SUBCUTANEOUS
  Filled 2022-01-03 (×14): qty 1

## 2022-01-03 MED ORDER — SODIUM CHLORIDE 0.9 % IV SOLN: INTRAVENOUS | Status: DC

## 2022-01-03 MED ORDER — FERROUS SULFATE 325 (65 FE) MG PO TABS
325.0000 mg | ORAL_TABLET | Freq: Two times a day (BID) | ORAL | Status: DC
  Administered 2022-01-03 – 2022-01-08 (×9): 325 mg via ORAL
  Filled 2022-01-03 (×9): qty 1

## 2022-01-03 MED ORDER — ACETAMINOPHEN 325 MG PO TABS
650.0000 mg | ORAL_TABLET | Freq: Four times a day (QID) | ORAL | Status: DC | PRN
  Administered 2022-01-05 – 2022-01-07 (×4): 650 mg via ORAL
  Filled 2022-01-03 (×4): qty 2

## 2022-01-03 MED ORDER — CIPROFLOXACIN IN D5W 400 MG/200ML IV SOLN
400.0000 mg | Freq: Once | INTRAVENOUS | Status: AC
  Administered 2022-01-03: 400 mg via INTRAVENOUS
  Filled 2022-01-03: qty 200

## 2022-01-03 MED ORDER — ADULT MULTIVITAMIN W/MINERALS CH
1.0000 | ORAL_TABLET | Freq: Every day | ORAL | Status: DC
  Administered 2022-01-03 – 2022-01-08 (×5): 1 via ORAL
  Filled 2022-01-03 (×5): qty 1

## 2022-01-03 MED ORDER — VANCOMYCIN HCL 1500 MG/300ML IV SOLN
1500.0000 mg | Freq: Once | INTRAVENOUS | Status: AC
Start: 2022-01-03 — End: 2022-01-04
  Administered 2022-01-03: 1500 mg via INTRAVENOUS
  Filled 2022-01-03: qty 300

## 2022-01-03 MED ORDER — SODIUM ZIRCONIUM CYCLOSILICATE 10 G PO PACK
10.0000 g | PACK | Freq: Once | ORAL | Status: AC
  Administered 2022-01-03: 10 g via ORAL
  Filled 2022-01-03: qty 1

## 2022-01-03 MED ORDER — SIMETHICONE 80 MG PO CHEW
80.0000 mg | CHEWABLE_TABLET | Freq: Four times a day (QID) | ORAL | Status: DC | PRN
Start: 2022-01-03 — End: 2022-01-09

## 2022-01-03 MED ORDER — CIPROFLOXACIN HCL 500 MG PO TABS: 500.0000 mg | ORAL_TABLET | Freq: Two times a day (BID) | ORAL | 0 refills | Status: DC

## 2022-01-03 MED ORDER — INSULIN GLARGINE-YFGN 100 UNIT/ML ~~LOC~~ SOLN
10.0000 [IU] | Freq: Every day | SUBCUTANEOUS | Status: DC
  Administered 2022-01-03 – 2022-01-05 (×3): 10 [IU] via SUBCUTANEOUS
  Filled 2022-01-03 (×4): qty 0.1

## 2022-01-03 MED ORDER — ALBUTEROL SULFATE (2.5 MG/3ML) 0.083% IN NEBU: 3.0000 mL | INHALATION_SOLUTION | RESPIRATORY_TRACT | Status: DC | PRN

## 2022-01-03 MED ORDER — HEPARIN SODIUM (PORCINE) 5000 UNIT/ML IJ SOLN
5000.0000 [IU] | Freq: Three times a day (TID) | INTRAMUSCULAR | Status: DC
  Administered 2022-01-03 – 2022-01-08 (×13): 5000 [IU] via SUBCUTANEOUS
  Filled 2022-01-03 (×13): qty 1

## 2022-01-03 MED ORDER — ATORVASTATIN CALCIUM 10 MG PO TABS
10.0000 mg | ORAL_TABLET | Freq: Every day | ORAL | Status: DC
  Administered 2022-01-03 – 2022-01-08 (×5): 10 mg via ORAL
  Filled 2022-01-03 (×5): qty 1

## 2022-01-03 MED ORDER — VANCOMYCIN HCL IN DEXTROSE 1-5 GM/200ML-% IV SOLN
1000.0000 mg | Freq: Once | INTRAVENOUS | Status: AC
  Administered 2022-01-03: 1000 mg via INTRAVENOUS
  Filled 2022-01-03: qty 200

## 2022-01-03 MED ORDER — HYDRALAZINE HCL 20 MG/ML IJ SOLN: 5.0000 mg | INTRAMUSCULAR | Status: DC | PRN

## 2022-01-03 MED ORDER — INSULIN ASPART 100 UNIT/ML IJ SOLN
0.0000 [IU] | Freq: Every day | INTRAMUSCULAR | Status: DC
  Administered 2022-01-05: 2 [IU] via SUBCUTANEOUS
  Filled 2022-01-03: qty 1

## 2022-01-03 NOTE — Discharge Instructions (Addendum)
Per Dr Ether [facility]---> At this point dressings can be performed just 3 times a week with a dry dressing change upon discharge to SNF.  Should minimize weightbearing to the foot.  Only weight-bear to the heel if absolutely necessary.  Otherwise should remain nonweightbearing to the right foot. ?

## 2022-01-03 NOTE — Assessment & Plan Note (Signed)
-   Consulted Dr. Lucky Cowboy of vascular surgery ?

## 2022-01-03 NOTE — Assessment & Plan Note (Signed)
-   Hold lisinopril-HCTZ due to worsening renal function ?-IV hydralazine as needed ?

## 2022-01-03 NOTE — Assessment & Plan Note (Signed)
Patient has diabetic right foot infection with osteomyelitis: Patient has elevated lactic acid 2.8, but does not meets critical for sepsis.  Consulted Dr. Lucky Cowboy of vascular surgery and Dr. Vickki Muff of podiatry. ? ?- Admitted to MedSurg bed as inpatient ?- Empiric antimicrobial treatment with vancomycin and Flagyl (patient received 1 dose of Cipro in ED) ?- PRN Zofran for nausea ?- Blood cultures x 2  ?- ESR and CRP ?- wound care consult ?- MRI-right foot  ?- will get Procalcitonin and trend lactic acid levels  ?- IVF: 2L of LR, followed by 75 cc/h NS ? ? ? ? ?

## 2022-01-03 NOTE — Assessment & Plan Note (Signed)
-   Hemoglobin stable, 11.0 ?-Continue iron supplement ?

## 2022-01-03 NOTE — ED Notes (Signed)
First Nurse Note:  Pt sent to the ED from the wound care center. They report that pt has wound on his right foot that is getting worse. Provider states that they care concerned about the wound and blood flow to his 2, 3, and 4th toes. ?

## 2022-01-03 NOTE — Progress Notes (Signed)
[name] ([id number]) ?Visit Report for 01/03/2022 ?Chief Complaint Document Details ?Patient Name: [name], [name]. ?Date of Service: 01/03/2022 10:00 AM ?Medical Record Number: [id number] ?Patient Account Number: [id number] ?Date of Birth/Sex: Jun 15, 1951 (71 y.o. M) ?Treating RN: Hansel Feinstein ?Primary Care Provider: Beverely Low Other Clinician: ?Referring Provider: Beverely Low ?Treating Provider/Extender: Allen Derry ?Weeks in Treatment: 0 ?Information Obtained from: Patient ?Chief Complaint ?Right toe ulcers ?Electronic Signature(s) ?Signed: 01/03/2022 10:57:03 AM By: Lenda Kelp PA-C ?Entered By: Lenda Kelp on 01/03/2022 10:57:02 ?BENJERMAN, MOLINELLI ([id number]) ?-------------------------------------------------------------------------------- ?HPI Details ?Patient Name: [name]. ?Date of Service: 01/03/2022 10:00 AM ?Medical Record Number: [id number] ?Patient Account Number: [id number] ?Date of Birth/Sex: 13-Sep-1950 (71 y.o. 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 Has been seen by [facility]iatry [facility] for a number of years it sounds like at least 4 years. With that being ?said they have tried a split thickness skin graft along with multiple other topical remedies over the time they have been seeing him over the past ?several years. Unfortunately they are not able to get this closed I did refer him to [place] for further evaluation and treatment. The patient tells me ?has been quite sometime since he remembers having  an x-ray and again we could not find anything recent when looking through his chart. ?Fortunately there does not appear to be any signs of active infection systemically which is great news. No fevers, chills, nausea, vomiting, or ?diarrhea. ?The patient does have diabetes with his most recent hemoglobin A1c being November 14, 2020 at 9.1. He also does have evidence of lymphedema ?based on what I am seeing today. 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 ?05/10/2021 upon evaluation today patient appears to be doing well with regard to his wounds in general. I feel like he is making progress. ?Fortunately the compression wrap did well for him and overall I am extremely happy with where we stand. No fevers, chills, nausea, vomiting, or ?diarrhea. ?05/17/2021 upon evaluation today patient appears to be doing better in general regard to his wounds. Overall I feel like he is actually signs of good ?improvement which is great news and I think that we are headed in the right direction. There does not appear to be any signs of active infection ?at this time. No fevers, chills, nausea, vomiting, or diarrhea. ?05/24/2021 upon evaluation today patient appears to be doing well with regard to his wounds. He is actually showing signs of improvement this is ?great news. Its mainly the right foot proximal wound that is going to require debridement today and again I do need to try to clear away some of ?the lip where he had some epiboly I then work on this week by week but if we get that cleared away I think this will heal much more effectively ?and quickly. ?05/31/2021 upon evaluation today patient appears to be doing well at this point in regard to  his wounds. Both are showing signs of improvement ?and very pleased in that regard. Fortunately there does not appear to be any evidence  of active infection systemically which is great news. No ?fevers, chills, nausea, vomiting, or diarrhea. ?06/07/2021 upon evaluation today patient appears to be making excellent progress in regard to his foot ulcers. Both are showing signs of excellent ?improvement. Fortunately there does not appear to be any evidence of active infection which is great news. No fevers, chills, nausea, vomiting, or ?diarrhea. ?06/21/2021 upon evaluation today patient appears to be doing well with regard to his wound. In fact he is making excellent progress and I am ?extremely pleased with where things stand today. There does not appear to be any signs of active infection at this time. ?06/28/2021 upon evaluation patient's foot ulcer actually is showing signs of good improvement. I am actually extremely pleased with how we ?stand today and I think that he does require some sharp debridement at this point but nothing in comparison what we have had to do before. ?Overall I think were headed in the appropriate direction he is very pleased to hear this. ?07/05/2021 upon evaluation today patient appears to be doing well with regard to his foot ulcer. In fact this is showing signs of excellent ?improvement which is great news. No fevers, chills, nausea, vomiting, or diarrhea. ?07/12/2021 upon evaluation today patient appears to be doing well with regard to his wound. He has been tolerating the dressing changes ?without complication. Fortunately this is showing signs of excellent improvement which is great news. No fevers, chills, nausea, vomiting, or ?diarrhea. ?07/19/2021 upon evaluation today patient appears to be doing well with regard to his leg ulcer in fact this appears to be completely closed at this ?point. I do not see anything open but I do believe we will get a need to continue to wrap him to help with compression therapy. He needs ?compression socks and I do not think he is quite ready to put those on yet he also tells me that he  needs to order some. ?07/29/2021 on evaluation today patient still appears to be doing well in fact everything still is closed although he did swell a bit over the past ?week. Fortunately there does not appear to be any signs of active infection at this time which is great news. ?08/05/2021 upon evaluation today patient appears to be doing well with regard to his wound. In fact this still appears to be healed I do not see any ?signs of active infection at this time which is great news. No fevers, chills, nausea, vomiting, or diarrhea. ?08/19/2021 upon evaluation today patient appears to be doing well with regard to his wound. This is actually looking better it still not really open ?however the area that is healed still continues to weep and drain a bit as well or continue to wrap him. I do feel like this region is actually toughen ?up though quite nicely over the past several weeks. ?09/05/2021 upon evaluation today patient appears to be doing well with regard to his wound. In fact this is still closed it has a new skin over top of ?that he still continues to weep around just from his lymphedema. Were using the compression wrap to keep this under control and overall that ?seems to be doing quite well. Unfortunately I do not see any signs of active infection locally nor systemically at this point which is great news. [name]?Basto, Alphonse O. ([id number]) ?09/12/2020 upon evaluation today patient appears  to be doing well with regard to his leg. He still has a small area that is weeping despite the fact ?that this has actually previously healed and looks to be doing excellent. I do not see any signs of infection at this time and overall I am extremely ?pleased with how things seem to be progressing. Hopefully he will be completely healed shortly. ?09/26/2021 upon evaluation today patient appears to be doing well with regard to his wound in fact this appears to be completely healed finally. I ?know this is been a long time  coming and he is very pleased to see that this is doing so much better. I do not see any evidence of active infection ?locally nor systemically at this time. ?Readmission: ?01-03-2022 upon evaluation today pa

## 2022-01-03 NOTE — Assessment & Plan Note (Signed)
Baseline creatinine 0.91 on 05/22/2017.  No recent baseline creatinine available.  His creatinine is 1.92, BUN 29. ?-Hold Prinzide ?-IV fluid as above ?

## 2022-01-03 NOTE — Consult Note (Signed)
Pharmacy Antibiotic Note ? ?[name] is a 71 y.o. male w/ h/o CKD, DM, DVT, HDL, PVD, arthritis, chronic lower extremity wounds and edema followed by wound clinic currently on Bactrim for cellulitis in the right foot (previous R 5th toe amputation) who presents after being referred from wound clinic for further evaluation with concerns for worsening infection of the right foot admitted on 01/03/2022 with  DFI w/ osteo .  Pharmacy has been consulted for Vancomycin dosing. ? ?Plan: ?Vancomycin 1g IV x1 in ED + vancomycin 1.5g x1 now to complete a 2.5g loading dose. ?Unclear baseline renal function, current calculations would use q36h dosing interval. Will re-assess trend with AM labs prior to scheduling maintenance regimen. ?F/u MRSA PCR ordered & cultures. ?Pt also receiving Metronidazole 500mg  IV q12h ? ? ?Height: 5\' 11"  (180.3 cm) ?Weight: 127 kg (280 lb) ?IBW/kg (Calculated) : 75.3 ? ?Temp (24hrs), Avg:98.2 ?F (36.8 ?C), Min:97.7 ?F (36.5 ?C), Max:98.6 ?F (37 ?C) ? ?Recent Labs  ?Lab 01/03/22 ?1158  ?WBC 6.3  ?CREATININE 1.92*  ?LATICACIDVEN 2.8*  ?  ?Estimated Creatinine Clearance: 47.9 mL/min (A) (by C-G formula based on SCr of 1.92 mg/dL (H)).   ? ?Allergies  ?Allergen Reactions  ? Sulfa Antibiotics Rash  ? Zosyn [Piperacillin Sod-Tazobactam So] Rash  ? ? ?Antimicrobials this admission: ?VAN/MTZ (5/05 >>  ?Cipro x1 (5/05) ? ?Dose adjustments this admission: ?CTM and adjust accordingly. Pt w/ CKD unclear baseline on review of labs.  ? ?Microbiology results: ?5/05 BCx: Pending ?5/05 MRSA PCR: ordered ? ?Thank you for allowing pharmacy to be a part of this patient?s care. ? ?7/05 [name] ?01/03/2022 3:23 PM ? ?

## 2022-01-03 NOTE — ED Triage Notes (Signed)
Patient has wound present to right foot/toes. Foul smelling. Drainage noted. Wound center sent patient to ER due to appearance of wound ?

## 2022-01-03 NOTE — ED Provider Notes (Addendum)
? ?[facility] ?Provider Note ? ? ? Event Date/Time  ? First MD Initiated Contact with Patient 01/03/22 1211   ?  (approximate) ? ? ?History  ? ?Wound Infection ? ? ?HPI ? ?[name] is a 71 y.o. 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  Patient states she noticed it about 2 or 3 weeks ago and discolored and the skin started coming off the top.  States his nails also fell off.  He does not recall any preceding injuries.  States he has been taking the Bactrim but does not think this is helped much.  He denies any pain and is able to still move his toes.  He denies any other sick symptoms including any symptoms in his left foot, right leg, fevers, chills, cough, nausea, vomiting, diarrhea, rash or any other acute concerns.  He notes he is not currently taking Xarelto as his PCP previously took him off this. ? ?  ?Past Medical History:  ?Diagnosis Date  ? Arthritis   ? Bladder incontinence   ? Chronic kidney disease   ? Stage 3 Kidney disease  ? Dementia ([place])   ? Diabetes mellitus without complication ([place])   ? DVT of leg (deep venous thrombosis) ([place])   ? H/O RIGHT LEG 2018  ? Dyspnea   ? occassional  ? Hyperlipidemia   ? Hypertension   ? Peripheral vascular disease ([place])   ? Sepsis ([place])   ? ? ? ?Physical Exam  ?Triage Vital Signs: ?ED Triage Vitals  ?Enc Vitals Group  ?   BP 01/03/22 1153 122/61  ?   Pulse Rate 01/03/22 1153 (!) 106  ?   Resp 01/03/22 1153 16  ?   Temp 01/03/22 1153 97.7 ?F (36.5 ?C)  ?   Temp Source 01/03/22 1153 Oral  ?   SpO2 01/03/22 1153 97 %  ?   Weight 01/03/22 1154 280 lb (127 kg)  ?   Height 01/03/22 1154 5\' 11"  (1.803 m)  ?   Head Circumference --   ?    Peak Flow --   ?   Pain Score 01/03/22 1154 5  ?   Pain Loc --   ?   Pain Edu? --   ?   Excl. in [place]? --   ? ? ?Most recent vital signs: ?Vitals:  ? 01/03/22 1153 01/03/22 1303  ?BP: 122/61 125/74  ?Pulse: (!) 106 (!) 105  ?Resp: 16 18  ?Temp: 97.7 ?F (36.5 ?C) 98.6 ?F (37 ?C)  ?SpO2: 97% 96%  ? ? ?General: Awake, no distress.  ?CV:  Significant murmur. ?Resp:  Normal effort.  ?Abd:  No distention.  ?Other:  Right foot is denuded with black discoloration around several of his digits as depicted in below photo.  Approximately skin is bony and there is some slight edema which is symmetric compared to the left.  No other areas of significant skin breakdown. ? ?Patient is oriented x4.  His speech is clear. ? ? ? ? ? ?ED Results / Procedures / Treatments  ?Labs ?(all labs ordered are listed, but only abnormal results are displayed) ?Labs Reviewed  ?LACTIC ACID, PLASMA - Abnormal; Notable for the following components:  ?  Result Value  ? Lactic Acid, Venous 2.8 (*)   ? All other components within normal limits  ?COMPREHENSIVE METABOLIC PANEL - Abnormal; Notable for the following components:  ? Potassium 5.2 (*)   ? CO2 18 (*)   ? Glucose, Bld 133 (*)   ? BUN 29 (*)   ? Creatinine, Ser 1.92 (*)   ? Total Protein 8.8 (*)   ? GFR, Estimated 37 (*)   ? All other components within normal limits  ?CBC WITH DIFFERENTIAL/PLATELET - Abnormal; Notable for the following components:  ? RBC 3.88 (*)   ? Hemoglobin 11.0 (*)   ? HCT 34.4 (*)   ? Platelets 430 (*)   ? All other components within normal limits  ?CBG MONITORING, ED - Abnormal; Notable for the following components:  ? Glucose-Capillary 122 (*)   ? All other components within normal limits  ?CULTURE, BLOOD (ROUTINE X 2)  ?CULTURE, BLOOD (ROUTINE X 2)  ?LACTIC ACID, PLASMA  ?SEDIMENTATION RATE  ?C-REACTIVE PROTEIN  ?PROCALCITONIN  ? ? ? ?EKG ? ? ?RADIOLOGY ? ?X-ray of the right foot on my interpretation shows.  Fifth metacarpal amputation with what appears to be fourth  phalanx fracture that is minimally displaced and some bony erosions possibly representing osteomyelitis.  I also reviewed radiology interpretation and agree their findings of similar. ? ?PROCEDURES: ? ?Critical Care performed: Yes, see critical care procedure note(s) ? ?.Critical Care ?Performed by: Lucrezia Starch, MD ?Authorized by: Lucrezia Starch, MD  ? ?Critical care provider statement:  ?  Critical care time (minutes):  30 ?  Critical care was necessary to treat or prevent imminent or life-threatening deterioration of the following conditions: Osteomyelitis with tachycardia lactic acidosis IV management of infection with some dry gangrene evident. ?  Critical care was time spent personally by me on the following activities:  Development of treatment plan with patient or surrogate, discussions with consultants, evaluation of patient's response to treatment, examination of patient, ordering and review of laboratory studies, ordering and review of radiographic studies, ordering and performing treatments and interventions, pulse oximetry, re-evaluation of patient's condition and review of old charts ?.1-3 Lead EKG Interpretation ?Performed by: Lucrezia Starch, MD ?Authorized by: Lucrezia Starch, MD  ? ?  Interpretation: normal   ?  ECG rate assessment: normal   ?  Rhythm: sinus rhythm   ?  Ectopy: none   ?  Conduction: normal   ? ?The patient is on the cardiac monitor to evaluate for evidence of arrhythmia and/or significant heart rate changes. ? ? ?MEDICATIONS ORDERED IN ED: ?Medications  ?ciprofloxacin (CIPRO) IVPB 400 mg (400 mg Intravenous New Bag/Given 01/03/22 1330)  ?vancomycin (VANCOCIN) IVPB 1000 mg/200 mL premix (1,000 mg Intravenous New Bag/Given 01/03/22 1356)  ?lactated ringers bolus 1,000 mL (0 mLs Intravenous Stopped 01/03/22 1356)  ?lactated ringers bolus 1,000 mL (1,000 mLs Intravenous New Bag/Given 01/03/22 1330)  ? ? ? ?IMPRESSION / MDM / ASSESSMENT AND PLAN / ED COURSE  ?I reviewed the triage vital  signs and the nursing notes. ?             ?               ? ?Differential diagnosis includes, but is not limited to ileitis with possible dry gangrene in the setting of advancing peripheral artery disease versus possible osteomyelitis and occult injury. ?  ? ?CMP is remarkable for a K of 5.2, bicarb of 18 and BUN of 29 with a creatinine  of 1.92.  There are not any recent labs to compare to as I am concerned for possible AKI.  BC shows no ketosis and hemoglobin of 11.  Initial lactic acid is 2.8. ? ?X-ray of the right foot on my interpretation shows.  Fifth metacarpal amputation with what appears to be fourth phalanx fracture that is minimally displaced and some bony erosions possibly representing osteomyelitis.  I also reviewed radiology interpretation and agree their findings of similar. ? ?Given concern for osteomyelitis with tachycardia and lactic acidosis I did order blood cultures and start patient on vancomycin for IV MRSA coverage.  He has a listed allergy to Zosyn and I discussed with pharmacy recommend starting patient on Cipro for Pseudomonas and gram-negative coverage.  ? ?I did reassess discussed with Dr. Vickki Muff on-call podiatrist recommends MR without contrast admission to hospital service for antibiotics with plan for him to see patient later today. ? ? ? ? ?FINAL CLINICAL IMPRESSION(S) / ED DIAGNOSES  ? ?Final diagnoses:  ?Cellulitis of right lower extremity  ?Osteomyelitis of right foot, unspecified type ([place])  ?AKI (acute kidney injury) ([place])  ?Lactic acidosis  ? ? ? ?Rx / DC Orders  ? ?ED Discharge Orders   ? ?      Ordered  ?  ciprofloxacin (CIPRO) 500 MG tablet  2 times daily       ? 01/03/22 1312  ? ?  ?  ? ?  ? ? ? ?Note:  This document was prepared using Dragon voice recognition software and may include unintentional dictation errors. ?  ?Lucrezia Starch, MD ?01/03/22 1325 ? ?  ?Lucrezia Starch, MD ?01/03/22 1401 ? ?

## 2022-01-03 NOTE — Progress Notes (Signed)
[name], [name] ([id number]) ?Visit Report for 01/03/2022 ?Allergy List Details ?Patient Name: [name], [name]. ?Date of Service: 01/03/2022 10:00 AM ?Medical Record Number: [id number] ?Patient Account Number: [id number] ?Date of Birth/Sex: [birth year] (71 y.o. M) ?Treating RN: Hansel Feinstein ?Primary Care Krystle Polcyn: Beverely Low Other Clinician: ?Referring Alvon Nygaard: Beverely Low ?Treating Nkosi Cortright/Extender: Allen Derry ?Weeks in Treatment: 0 ?Allergies ?Active Allergies ?Zosyn ?Allergy Notes ?Electronic Signature(s) ?Signed: 01/03/2022 1:28:17 PM By: Hansel Feinstein ?Entered ByHansel Feinstein on 01/03/2022 10:22:44 ?TELLER, WAKE[place] ([id number]) ?-------------------------------------------------------------------------------- ?Arrival Information Details ?Patient Name: [name], [name]. ?Date of Service: 01/03/2022 10:00 AM ?Medical Record Number: [id number] ?Patient Account Number: [id number] ?Date of Birth/Sex: 18-Nov-1950 (71 y.o. M) ?Treating RN: Hansel Feinstein ?Primary Care Jarrah Babich: Beverely Low Other Clinician: ?Referring Kendrik Mcshan: Beverely Low ?Treating Adaia Matthies/Extender: Allen Derry ?Weeks in Treatment: 0 ?Visit Information ?Patient Arrived: Dan Humphreys ?Arrival Time: 10:20 ?Accompanied By: self ?Transfer Assistance: None ?Patient Identification Verified: Yes ?Secondary Verification Process Completed: Yes ?Patient Requires Transmission-Based No ?Precautions: ?Patient Has Alerts: Yes ?Patient Alerts: Patient on Blood ?Thinner ?DIABETIC ?aspirin 81mg  ?AVVS ABI 2022 ?History Since Last Visit ?Electronic Signature(s) ?Signed: 01/03/2022 1:28:17 PM By: 03/05/2022 ?Entered ByHansel Feinstein on 01/03/2022 10:26:33 ?[name], [name] ([name]) ?-------------------------------------------------------------------------------- ?Clinic Level of Care Assessment Details ?Patient Name: [name], [name]. ?Date of Service: 01/03/2022 10:00 AM ?Medical Record Number: 03/05/2022 ?Patient Account Number: [id number] ?Date of Birth/Sex: 10/13/1950  (71 y.o. M) ?Treating RN: (75 ?Primary Care Okey Zelek: Hansel Feinstein Other Clinician: ?Referring Perlita Forbush: Beverely Low ?Treating Ivey Cina/Extender: Beverely Low ?Weeks in Treatment: 0 ?Clinic Level of Care Assessment Items ?TOOL 2 Quantity Score ?[]  - Use when only an EandM is performed on the INITIAL visit 0 ?ASSESSMENTS - Nursing Assessment / Reassessment ?X - General Physical Exam (combine w/ comprehensive assessment (listed just below) when performed on new ?1 20 ?pt. evals) ?X- 1 25 ?Comprehensive Assessment (HX, ROS, Risk Assessments, Wounds Hx, etc.) ?ASSESSMENTS - Wound and Skin Assessment / Reassessment ?[]  - Simple Wound Assessment / Reassessment - one wound 0 ?X- 3 5 ?Complex Wound Assessment / Reassessment - multiple wounds ?[]  - 0 ?Dermatologic / Skin Assessment (not related to wound area) ?ASSESSMENTS - Ostomy and/or Continence Assessment and Care ?[]  - Incontinence Assessment and Management 0 ?[]  - 0 ?Ostomy Care Assessment and Management (repouching, etc.) ?PROCESS - Coordination of Care ?X - Simple Patient / Family Education for ongoing care 1 15 ?[]  - 0 ?Complex (extensive) Patient / Family Education for ongoing care ?[]  - 0 ?Staff obtains Consents, Records, Test Results / Process Orders ?[]  - 0 ?Staff telephones HHA, Nursing Homes / Clarify orders / etc ?[]  - 0 ?Routine Transfer to another Facility (non-emergent condition) ?[]  - 0 ?Routine Hospital Admission (non-emergent condition) ?X- 1 15 ?New Admissions / [place] Derry / Ordering NPWT, Apligraf, etc. ?[]  - 0 ?Emergency Hospital Admission (emergent condition) ?X- 1 10 ?Simple Discharge Coordination ?[]  - 0 ?Complex (extensive) Discharge Coordination ?PROCESS - Special Needs ?[]  - Pediatric / Minor Patient Management 0 ?[]  - 0 ?Isolation Patient Management ?[]  - 0 ?Hearing / Language / Visual special needs ?[]  - 0 ?Assessment of Community assistance (transportation, D/C planning, etc.) ?[]  - 0 ?Additional assistance / Altered  mentation ?[]  - 0 ?Support Surface(s) Assessment (bed, cushion, seat, etc.) ?INTERVENTIONS - Wound Cleansing / Measurement ?X - Wound Imaging (photographs - any number of wounds) 1 5 ?[]  - 0 ?Wound Tracing (instead of photographs) ?[]  - 0 ?Simple Wound Measurement - one wound ?X- 3 5 ?Complex Wound Measurement - multiple  wounds ?[name], [name] ([id number]) ?[]  - 0 ?Simple Wound Cleansing - one wound ?X- 3 5 ?Complex Wound Cleansing - multiple wounds ?INTERVENTIONS - Wound Dressings ?X - Small Wound Dressing one or multiple wounds 3 10 ?[]  - 0 ?Medium Wound Dressing one or multiple wounds ?[]  - 0 ?Large Wound Dressing one or multiple wounds ?[]  - 0 ?Application of Medications - injection ?INTERVENTIONS - Miscellaneous ?[]  - External ear exam 0 ?[]  - 0 ?Specimen Collection (cultures, biopsies, blood, body fluids, etc.) ?[]  - 0 ?Specimen(s) / Culture(s) sent or taken to Lab for analysis ?[]  - 0 ?Patient Transfer (multiple staff / / Similar devices) ?[]  - 0 ?Simple Staple / Suture removal (25 or less) ?[]  - 0 ?Complex Staple / Suture removal (26 or more) ?[]  - 0 ?Hypo / Hyperglycemic Management (close monitor of Blood Glucose) ?X- 1 15 ?Ankle / Brachial Index (ABI) - do not check if billed separately ?Has the patient been seen at the hospital within the last three years: Yes ?Total Score: 180 ?Level Of Care: New/Established - Level ?5 ?Electronic Signature(s) ?Signed: 01/03/2022 1:28:17 PM By: ?Entered By on 01/03/2022 11:09:49 ?[name], [name] ( ) ?-------------------------------------------------------------------------------- ?Encounter Discharge Information Details ?Patient Name: [name], [name]. ?Date of Service: 01/03/2022 10:00 AM ?Medical Record Number: ?Patient Account Number: ?Date of Birth/Sex: 29-Mar-1951 (71 y.o. M) ?Treating RN: Hansel Feinstein ?Primary Care Emiline Mancebo: Hansel Feinstein Other Clinician: ?Referring Elvenia Godden: 03/05/2022 ?Treating  Hubbard Seldon/Extender: [name] ?Weeks in Treatment: 0 ?Encounter Discharge Information Items ?Discharge Condition: Stable ?Ambulatory Status: [id number] ?Discharge Destination: Emergency Room ?Telephoned: Yes ?Spoke With: [name] ?Orders Sent: Yes ?Transportation: Private Auto ?Accompanied By: self ?Schedule Follow-up Appointment: Yes ?Clinical Summary of Care: ?Electronic Signature(s) ?Signed: 01/03/2022 1:28:17 PM By: [id number] ?Entered [id number] on 01/03/2022 11:14:08 ?[name], [name] (Hansel Feinstein) ?-------------------------------------------------------------------------------- ?Lower Extremity Assessment Details ?Patient Name: [name], [name]. ?Date of Service: 01/03/2022 10:00 AM ?Medical Record Number: Allen Derry ?Patient Account Number: Dan Humphreys ?Date of Birth/Sex: 03/15/1951 (71 y.o. M) ?Treating RN: Hansel Feinstein ?Primary Care Takima Encina: Hansel Feinstein Other Clinician: ?Referring Melisse Caetano: 03/05/2022 ?Treating Kaiel Weide/Extender: [name] ?Weeks in Treatment: 0 ?Edema Assessment ?Assessed: [Left: No] [Right: Yes] ?Edema: [Left: Ye] [Right: s] ?Calf ?Left: Right: ?Point of Measurement: 34 cm From Medial Instep 47 cm ?Ankle ?Left: Right: ?Point of Measurement: 11 cm From Medial Instep 26 cm ?Knee To Floor ?Left: Right: ?From Medial Instep 42 cm ?Vascular Assessment ?Pulses: ?Dorsalis Pedis ?Palpable: [Right:No] ?Doppler Audible: [Right:Yes] ?Blood Pressure: ?Brachial: [Right:153] ?Ankle: ?[Right:Dorsalis Pedis: 118 0.77] ?Electronic Signature(s) ?Signed: 01/03/2022 1:28:17 PM By: [name] ?Entered By7/12/2021 on 01/03/2022 10:33:17 ?[name], [name] (06/16/1951) ?-------------------------------------------------------------------------------- ?Multi Wound Chart Details ?Patient Name: [name], [name]. ?Date of Service: 01/03/2022 10:00 AM ?Medical Record Number: Beverely Low ?Patient Account Number: Beverely Low ?Date of Birth/Sex: 08/03/1951 (71 y.o. M) ?Treating RN: Hansel Feinstein ?Primary Care Krystl Wickware:  Hansel Feinstein Other Clinician: ?Referring Dianne Bady: 03/05/2022 ?Treating Fairy Ashlock/Extender: [name] ?Weeks in Treatment: 0 ?Vital Signs ?Height(in): 71 ?Pulse(bpm): 102 ?Weight(lbs): 280 ?Blood Press

## 2022-01-03 NOTE — Consult Note (Signed)
Consult received. ? ?Pt well know to me.   ? ?Chart and xrays evaluated. ? ?Will await results of MRI. ? ?Clinical picture shows areas of dry gangrene to toe. ? ?Full note to follow once admitted. ? ? ?

## 2022-01-03 NOTE — H&P (Signed)
?History and Physical  ? ? ?[name] UK:[id number] DOB: Jan 31, 1951 DOA: 01/03/2022 ? ?Referring MD/NP/PA:  ? ?PCP: Frazier Richards, MD  ? ?Patient coming from:  The patient is coming from home.  At baseline, pt is independent for most of ADL.       ? ?Chief Complaint: right foot infection. ? ?HPI: [name] is a 71 y.o. male with medical history significant of PVD, diabetes mellitus, hypertension, hyperlipidemia, right leg DVT off Xarelto, CKD-3A, lymphedema, obesity with BMI 39.05, iron deficiency anemia, who presents with right foot infection. ? ?Pt states that has right foot infection which has been going on for more than 2 weeks.  Patient is taking Bactrim currently and following up in wound clinic.  He states that his right foot infection has been progressively worsening. He noted foot discoloration, with skin started coming off the top. States his nails also fell off.  No injury.  Patient does not have pain, no fever or chills. Patient has dry cough, no shortness breath or chest pain.  No nausea vomiting, diarrhea or abdominal pain.  No symptoms of UTI.  ? ?Data Reviewed and ED Course: pt was found to have WBC 6.3, potassium 5.2, worsening renal function, temperature normal, blood pressure 125/74, heart rate of 106, RR 18, oxygen saturation 97% on room air.  X-ray of right foot showed possible osteomyelitis.  Patient is admitted to [facility] bed as inpatient.  Dr. Lucky Cowboy of vascular surgery and Dr. Vickki Muff of podiatry are consulted. ? ? ?EKG: I have personally reviewed. ? ? ?Review of Systems:  ? ?General: no fevers, chills, no body weight gain, has fatigue ?HEENT: no blurry vision, hearing changes or sore throat ?Respiratory: no dyspnea, coughing, wheezing ?CV: no chest pain, no palpitations ?GI: no nausea, vomiting, abdominal pain, diarrhea, constipation ?GU: no dysuria, burning on urination, increased urinary frequency, hematuria  ?Ext: the right foot is swelling, with gangrenous necrotic  change ?Neuro: no unilateral weakness, numbness, or tingling, no vision change or hearing loss ?Skin: no rash, no skin tear. ?MSK: No muscle spasm, no deformity, no limitation of range of movement in spin ?Heme: No easy bruising.  ?Travel history: No recent long distant travel. ? ? ?Allergy:  ?Allergies  ?Allergen Reactions  ? Sulfa Antibiotics Rash  ? Zosyn [Piperacillin Sod-Tazobactam So] Rash  ? ? ?Past Medical History:  ?Diagnosis Date  ? Arthritis   ? Bladder incontinence   ? Chronic kidney disease   ? Stage 3 Kidney disease  ? Dementia ([place])   ? Diabetes mellitus without complication ([place])   ? DVT of leg (deep venous thrombosis) ([place])   ? H/O RIGHT LEG 2018  ? Dyspnea   ? occassional  ? Hyperlipidemia   ? Hypertension   ? Peripheral vascular disease ([place])   ? Sepsis ([place])   ? ? ?Past Surgical History:  ?Procedure Laterality Date  ? ABDOMINAL AORTOGRAM W/LOWER EXTREMITY N/A 10/10/2016  ? Procedure: Abdominal Aortogram w/Lower Extremity;  Surgeon: Katha Cabal, MD;  Location: [facility];  Service: Cardiovascular;  Laterality: N/A;  ? CATARACT EXTRACTION W/PHACO Left 05/27/2018  ? Procedure: CATARACT EXTRACTION PHACO AND INTRAOCULAR LENS PLACEMENT (IOC);  Surgeon: Eulogio Bear, MD;  Location: [facility];  Service: Ophthalmology;  Laterality: Left;  [place] 03:06.2 ?AP% 16.1 ?CDE 30.16 ?FLUID PACK LOT @ [id number]  ? CIRCUMCISION  1998  ? EYE SURGERY Left   ? Retina Detachment  ? GRAFT APPLICATION Right [id number]  ? Procedure: GRAFT APPLICATION (  RIGHT FOOT );  Surgeon: Katha Cabal, MD;  Location: [facility];  Service: Vascular;  Laterality: Right;  graft taken from patients right thigh  ? IRRIGATION AND DEBRIDEMENT FOOT Right 10/03/2016  ? Procedure: IRRIGATION AND DEBRIDEMENT FOOT;  Surgeon: Samara Deist, DPM;  Location: [facility];  Service: Podiatry;  Laterality: Right;  ? LOWER EXTREMITY INTERVENTION  10/10/2016  ? Procedure: Lower Extremity Intervention;  Surgeon: Katha Cabal, MD;  Location:  [facility];  Service: Cardiovascular;;  ? PERIPHERAL VASCULAR BALLOON ANGIOPLASTY Left 10/10/2016  ? Procedure: Peripheral Vascular Balloon Angioplasty;  Surgeon: Katha Cabal, MD;  Location: [facility];  Service: Cardiovascular;  Laterality: Left;  ? TONSILLECTOMY    ? WOUND DEBRIDEMENT Right 11/07/2016  ? Procedure: DEBRIDEMENT OF WOUND AND BONE RIGHT FOOT AND APPLY WOUND VAC;  Surgeon: Samara Deist, DPM;  Location: [facility];  Service: Podiatry;  Laterality: Right;  ? ? ?Social History:  reports that he quit smoking about 46 years ago. His smoking use included pipe. He has never used smokeless tobacco. He reports that he does not drink alcohol and does not use drugs. ? ?Family History:  ?Family History  ?Problem Relation Age of Onset  ? Diabetes Mother   ? Hypertension Mother   ? Diabetes Father   ? Hypertension Father   ?  ? ?Prior to Admission medications   ?Medication Sig Start Date End Date Taking? Authorizing Provider  ?ciprofloxacin (CIPRO) 500 MG tablet Take 1 tablet (500 mg total) by mouth 2 (two) times daily for 10 days. 01/03/22 01/13/22 Yes Lucrezia Starch, MD  ?ACCU-CHEK GUIDE test strip Use 1 strip via meter four times a day  to monitor blood glucose 07/10/20   [provider]  ?Accu-Chek Softclix Lancets lancets  04/29/20   [provider]  ?acetaminophen (TYLENOL) 325 MG tablet Take 650 mg by mouth every 6 (six) hours as needed.    [provider]  ?atorvastatin (LIPITOR) 10 MG tablet TAKE 1 TABLET  EACH  EVENING FOR HIGH CHOLESTEROL 06/12/20   [provider]  ?atorvastatin (LIPITOR) 20 MG tablet Take 20 mg by mouth daily.    [provider]  ?bacitracin ointment Apply in a thin layer to the wound of the right foot and cover with gauze and a wrap  ?Do this daily (once per day) 08/20/17   Schnier, Dolores Lory, MD  ?Calcium Alginate (RESTORE CALCICARE DRESSING) MISC APPLY TO WOUNDS THEN COVER WITH FOAM DRESSING EVERY OTHER DAY 04/19/21    [provider]  ?docusate sodium (COLACE) 100 MG capsule Take 1 capsule (100 mg total) by mouth 2 (two) times daily. 10/13/16   Gladstone Lighter, MD  ?DROPLET INSULIN SYRINGE 31G X 5/16" 0.3 ML MISC USE FIVE TIMES DAILY AS DIRECTED 06/12/20   [provider]  ?empagliflozin (JARDIANCE) 10 MG TABS tablet  03/15/21   [provider]  ?FEROSUL 325 (65 Fe) MG tablet Take 325 mg by mouth 2 (two) times daily with a meal. 12/26/21   [provider]  ?glucose blood (ACCU-CHEK AVIVA PLUS) test strip  04/29/20   [provider]  ?insulin glargine (LANTUS) 100 UNIT/ML injection Inject 0.15 mLs (15 Units total) into the skin at bedtime. 10/13/16   Gladstone Lighter, MD  ?insulin lispro (HUMALOG) 100 UNIT/ML injection Inject 4 Units into the skin See admin instructions. Inject 4 units SQ with breakfast, inject 4 units SQ with lunch and inject 4 units SQ with dinner  [provider]  ?Insulin Syringe-Needle U-100 31G X 5/16" 0.3 ML MISC  06/05/20   [provider]  ?lisinopril-hydrochlorothiazide (PRINZIDE,ZESTORETIC) 20-25 MG tablet Take by mouth.    [provider]  ?metFORMIN (GLUCOPHAGE) 1000 MG tablet TAKE 1 TABLET TWICE DAILY FOR DIABETES 06/12/20   [provider]  ?metFORMIN (GLUCOPHAGE) 500 MG tablet Take 500 mg by mouth 2 (two) times daily with a meal.     [provider]  ?Multiple Vitamins-Minerals (CENTROVITE) TABS Take 1 tablet by mouth daily.    [provider]  ?polyethylene glycol (MIRALAX / GLYCOLAX) packet Take 17 g by mouth daily.    [provider]  ?senna (SENOKOT) 8.6 MG TABS tablet Take 1 tablet by mouth 2 (two) times daily.    [provider]  ?silver sulfADIAZINE (SILVADENE) 1 % cream Apply 1 application topically daily. Apply to right foot ulcer BID 09/24/20   Schnier, Dolores Lory, MD  ?Simethicone (GAS-X ULTRA STRENGTH PO) Take 1 capsule by mouth 4 (four) times daily as needed (for  flatulence).    [provider]  ? ? ?Physical Exam: ?Vitals:  ? 01/03/22 1153 01/03/22 1154 01/03/22 1303  ?BP: 122/61  125/74  ?Pulse: (!) 106  (!) 105  ?Resp: 16  18  ?Temp: 97.7 ?F (36.5 ?C)  98.6 ?F (37 ?C)

## 2022-01-03 NOTE — Assessment & Plan Note (Signed)
Potassium 5.2 ?-IV fluid as above ?-Leukoma 10 g ?

## 2022-01-03 NOTE — Progress Notes (Signed)
RICHAD, RAMSAY ([id number]) ?Visit Report for 01/03/2022 ?Abuse Risk Screen Details ?Patient Name: [name], [name]. ?Date of Service: 01/03/2022 10:00 AM ?Medical Record Number: [id number] ?Patient Account Number: [id number] ?Date of Birth/Sex: 02/13/1951 (71 y.o. M) ?Treating RN: Hansel Feinstein ?Primary Care Emmagene Ortner: Beverely Low Other Clinician: ?Referring Kennedy Brines: Beverely Low ?Treating Rosemary Pentecost/Extender: [place] Derry ?Weeks in Treatment: 0 ?Abuse Risk Screen Items ?Answer ?ABUSE RISK SCREEN: ?Has anyone close to you tried to hurt or harm you recentlyo No ?Do you feel uncomfortable with anyone in your familyo No ?Has anyone forced you do things that you didnot want to doo No ?Electronic Signature(s) ?Signed: 01/03/2022 1:28:17 PM By: Hansel Feinstein ?Entered ByHansel Feinstein on 01/03/2022 10:25:07 ?JESUA, TAMBLYN ([id number]) ?-------------------------------------------------------------------------------- ?Activities of Daily Living Details ?Patient Name: [name], [name]. ?Date of Service: 01/03/2022 10:00 AM ?Medical Record Number: [id number] ?Patient Account Number: [id number] ?Date of Birth/Sex: [birth year] (71 y.o. M) ?Treating RN: Hansel Feinstein ?Primary Care Ara Grandmaison: Beverely Low Other Clinician: ?Referring Graciemae Delisle: Beverely Low ?Treating Samani Deal/Extender: [place] Derry ?Weeks in Treatment: 0 ?Activities of Daily Living Items ?Answer ?Activities of Daily Living (Please select one for each item) ?Drive Automobile Completely Able ?Take Medications Completely Able ?Use Telephone Completely Able ?Care for Appearance Completely Able ?Use Toilet Completely Able ?Bath / Shower Completely Able ?Dress Self Completely Able ?Feed Self Completely Able ?Walk Completely Able ?Get In / Out Bed Completely Able ?Housework Completely Able ?Prepare Meals Completely Able ?Handle Money Completely Able ?Shop for Self Completely Able ?Electronic Signature(s) ?Signed: 01/03/2022 1:28:17 PM By: Hansel Feinstein ?Entered ByHansel Feinstein on  01/03/2022 10:25:26 ?JAXYN, ROUT ([id number]) ?-------------------------------------------------------------------------------- ?Education Screening Details ?Patient Name: [name], [name]. ?Date of Service: 01/03/2022 10:00 AM ?Medical Record Number: [id number] ?Patient Account Number: [id number] ?Date of Birth/Sex: July 29, 1951 (71 y.o. M) ?Treating RN: Hansel Feinstein ?Primary Care Sharry Beining: Beverely Low Other Clinician: ?Referring Illona Bulman: Beverely Low ?Treating Bricen Victory/Extender: [place] Derry ?Weeks in Treatment: 0 ?Primary Learner Assessed: Patient ?Learning Preferences/Education Level/Primary Language ?Learning Preference: Explanation ?Highest Education Level: College or Above ?Preferred Language: English ?Cognitive Barrier ?Language Barrier: No ?Translator Needed: No ?Memory Deficit: No ?Emotional Barrier: No ?Cultural/Religious Beliefs Affecting Medical Care: No ?Physical Barrier ?Impaired Vision: No ?Impaired Hearing: No ?Decreased Hand dexterity: No ?Knowledge/Comprehension ?Knowledge Level: Medium ?Comprehension Level: High ?Ability to understand written instructions: High ?Ability to understand verbal instructions: High ?Motivation ?Anxiety Level: Calm ?Cooperation: Cooperative ?Education Importance: Acknowledges Need ?Interest in Health Problems: Asks Questions ?Perception: Coherent ?Willingness to Engage in Self-Management ?High ?Activities: ?Readiness to Engage in Self-Management ?High ?Activities: ?Electronic Signature(s) ?Signed: 01/03/2022 1:28:17 PM By: Hansel Feinstein ?Entered ByHansel Feinstein on 01/03/2022 10:25:48 ?JAHAZIEL, FRANCOIS ([id number]) ?-------------------------------------------------------------------------------- ?Fall Risk Assessment Details ?Patient Name: [name], [name]. ?Date of Service: 01/03/2022 10:00 AM ?Medical Record Number: [id number] ?Patient Account Number: [id number] ?Date of Birth/Sex: 1951/08/05 (71 y.o. M) ?Treating RN: Hansel Feinstein ?Primary Care Baptiste Littler: Beverely Low  Other Clinician: ?Referring Annabeth Tortora: Beverely Low ?Treating Kennady Zimmerle/Extender: [place] Derry ?Weeks in Treatment: 0 ?Fall Risk Assessment Items ?Have you had 2 or more falls in the last 12 monthso 0 No ?Have you had any fall that resulted in injury in the last 12 monthso 0 No ?FALLS RISK SCREEN ?History of falling - immediate or within 3 months 0 No ?Secondary diagnosis (Do you have 2 or more medical diagnoseso) 15 Yes ?Ambulatory aid ?None/bed rest/wheelchair/nurse 0 No ?Crutches/cane/walker 15 Yes ?Furniture 0 No ?Intravenous therapy Access/Saline/Heparin Lock 0 No ?Gait/Transferring ?Normal/ bed rest/ wheelchair 0 Yes ?Weak (short steps with or without shuffle,  stooped but able to lift head while walking, may ?0 No ?seek support from furniture) ?Impaired (short steps with shuffle, may have difficulty arising from chair, head down, impaired ?0 No ?balance) ?Mental Status ?Oriented to own ability 0 Yes ?Electronic Signature(s) ?Signed: 01/03/2022 1:28:17 PM By: Hansel Feinstein ?Entered ByHansel Feinstein on 01/03/2022 10:26:04 ?DARELL, SAPUTO ([id number]) ?-------------------------------------------------------------------------------- ?Foot Assessment Details ?Patient Name: [name], [name]. ?Date of Service: 01/03/2022 10:00 AM ?Medical Record Number: [id number] ?Patient Account Number: [id number] ?Date of Birth/Sex: 10-Aug-1951 (71 y.o. M) ?Treating RN: Hansel Feinstein ?Primary Care Irven Ingalsbe: Beverely Low Other Clinician: ?Referring Edwinna Rochette: Beverely Low ?Treating Titania Gault/Extender: [place] Derry ?Weeks in Treatment: 0 ?Foot Assessment Items ?Site Locations ?+ = Sensation present, - = Sensation absent, C = Callus, U = Ulcer ?R = Redness, W = Warmth, M = Maceration, PU = Pre-ulcerative lesion ?F = Fissure, S = Swelling, D = Dryness ?Assessment ?Right: Left: ?Other Deformity: No No ?Prior Foot Ulcer: No No ?Prior Amputation: Yes No ?Charcot Joint: No No ?Ambulatory Status: Ambulatory With Help ?Assistance Device: Walker ?Gait:  Steady ?Electronic Signature(s) ?Signed: 01/03/2022 1:28:17 PM By: Hansel Feinstein ?Entered ByHansel Feinstein on 01/03/2022 00:76:22 ?MARSDEN, ZAINO ([id number]) ?-------------------------------------------------------------------------------- ?Nutrition Risk Screening Details ?Patient Name: [name], [name]. ?Date of Service: 01/03/2022 10:00 AM ?Medical Record Number: [id number] ?Patient Account Number: [id number] ?Date of Birth/Sex: December 13, 1950 (71 y.o. M) ?Treating RN: Hansel Feinstein ?Primary Care Jahlil Ziller: Beverely Low Other Clinician: ?Referring Sanjeev Main: Beverely Low ?Treating Aleric Froelich/Extender: [place] Derry ?Weeks in Treatment: 0 ?Height (in): 71 ?Weight (lbs): 280 ?Body Mass Index (BMI): 39 ?Nutrition Risk Screening Items ?Score Screening ?NUTRITION RISK SCREEN: ?I have an illness or condition that made me change the kind and/or amount of food I eat 0 No ?I eat fewer than two meals per day 0 No ?I eat few fruits and vegetables, or milk products 0 No ?I have three or more drinks of beer, liquor or wine almost every day 0 No ?I have tooth or mouth problems that make it hard for me to eat 0 No ?I don't always have enough money to buy the food I need 0 No ?I eat alone most of the time 0 No ?I take three or more different prescribed or over-the-counter drugs a day 1 Yes ?Without wanting to, I have lost or gained 10 pounds in the last six months 0 No ?I am not always physically able to shop, cook and/or feed myself 0 No ?Nutrition Protocols ?Good Risk Protocol 0 No interventions needed ?Moderate Risk Protocol ?High Risk Proctocol ?Risk Level: Good Risk ?Score: 1 ?Electronic Signature(s) ?Signed: 01/03/2022 1:28:17 PM By: Hansel Feinstein ?Entered ByHansel Feinstein on 01/03/2022 10:26:14 ?

## 2022-01-03 NOTE — Assessment & Plan Note (Signed)
Recent A1c 8.7, well controlled.  Patient is taking Humalog, metformin and Lantus to 15 units daily ?-Sliding scale insulin ?-Glargine insulin 10 units daily ?

## 2022-01-03 NOTE — Assessment & Plan Note (Signed)
Lipitor 

## 2022-01-04 ENCOUNTER — Inpatient Hospital Stay: Payer: Medicare HMO

## 2022-01-04 DIAGNOSIS — E11628 Type 2 diabetes mellitus with other skin complications: Secondary | ICD-10-CM | POA: Diagnosis not present

## 2022-01-04 DIAGNOSIS — M009 Pyogenic arthritis, unspecified: Secondary | ICD-10-CM

## 2022-01-04 DIAGNOSIS — L089 Local infection of the skin and subcutaneous tissue, unspecified: Secondary | ICD-10-CM | POA: Diagnosis not present

## 2022-01-04 DIAGNOSIS — M868X7 Other osteomyelitis, ankle and foot: Secondary | ICD-10-CM

## 2022-01-04 LAB — CBC
HCT: 29.3 % — ABNORMAL LOW (ref 39.0–52.0)
Hemoglobin: 9.7 g/dL — ABNORMAL LOW (ref 13.0–17.0)
MCH: 28.4 pg (ref 26.0–34.0)
MCHC: 33.1 g/dL (ref 30.0–36.0)
MCV: 85.9 fL (ref 80.0–100.0)
Platelets: 350 10*3/uL (ref 150–400)
RBC: 3.41 MIL/uL — ABNORMAL LOW (ref 4.22–5.81)
RDW: 14.6 % (ref 11.5–15.5)
WBC: 5.1 10*3/uL (ref 4.0–10.5)
nRBC: 0 % (ref 0.0–0.2)

## 2022-01-04 LAB — BASIC METABOLIC PANEL
Anion gap: 6 (ref 5–15)
BUN: 20 mg/dL (ref 8–23)
CO2: 19 mmol/L — ABNORMAL LOW (ref 22–32)
Calcium: 8.8 mg/dL — ABNORMAL LOW (ref 8.9–10.3)
Chloride: 109 mmol/L (ref 98–111)
Creatinine, Ser: 1.47 mg/dL — ABNORMAL HIGH (ref 0.61–1.24)
GFR, Estimated: 51 mL/min — ABNORMAL LOW (ref >60)
Glucose, Bld: 131 mg/dL — ABNORMAL HIGH (ref 70–99)
Potassium: 5.1 mmol/L (ref 3.5–5.1)
Sodium: 134 mmol/L — ABNORMAL LOW (ref 135–145)

## 2022-01-04 LAB — GLUCOSE, CAPILLARY
Glucose-Capillary: 138 mg/dL — ABNORMAL HIGH (ref 70–99)
Glucose-Capillary: 165 mg/dL — ABNORMAL HIGH (ref 70–99)
Glucose-Capillary: 176 mg/dL — ABNORMAL HIGH (ref 70–99)
Glucose-Capillary: 172 mg/dL — ABNORMAL HIGH (ref 70–99)

## 2022-01-04 LAB — PROCALCITONIN: Procalcitonin: 0.21 ng/mL

## 2022-01-04 MED ORDER — CIPROFLOXACIN IN D5W 400 MG/200ML IV SOLN
400.0000 mg | Freq: Two times a day (BID) | INTRAVENOUS | Status: DC
  Administered 2022-01-04 – 2022-01-06 (×5): 400 mg via INTRAVENOUS
  Filled 2022-01-04 (×5): qty 200

## 2022-01-04 MED ORDER — POVIDONE-IODINE 10 % EX SWAB: 2.0000 "application " | Freq: Once | CUTANEOUS | Status: DC

## 2022-01-04 MED ORDER — CHLORHEXIDINE GLUCONATE 4 % EX LIQD: 60.0000 mL | Freq: Once | CUTANEOUS | Status: DC

## 2022-01-04 NOTE — Plan of Care (Signed)

## 2022-01-04 NOTE — Consult Note (Signed)
WOC Nurse Consult Note: ?Consult received for right foot infection with gangrenous changed. Podiatry (Dr.Fowler) has seen and recommends transmetatarsal head amputation. He is managing care, following. ? ? ?WOC nursing team did not see and will not follow, but will remain available to this patient, the nursing and medical teams.  Please re-consult if needed. ? ?Thanks, ?Ladona Mow, MSN, RN, GNP, CWOCN, CWON-AP, FAAN  ?Pager# [phone]  ? ? ?   ?

## 2022-01-04 NOTE — Progress Notes (Signed)
[facility] ? ? ?PATIENT NAME: [name]   ? ?MR#:  [id number] ? ?DATE OF BIRTH:  [birth year] ? ?SUBJECTIVE:  ?came in with complains of worsening wound to his right foot. He has been followed to the wound care center for some time. Has history of lymphedema and diabetes. ?MRI positive for osteomyelitis ? ? ?VITALS:  ?Blood pressure 104/70, pulse 84, temperature 98.9 ?F (37.2 ?C), temperature source Oral, resp. rate 18, height 5\' 11"  (1.803 m), weight 127 kg, SpO2 98 %. ? ?PHYSICAL EXAMINATION:  ? ?GENERAL:  71 y.o.-year-old patient lying in the bed with no acute distress.  ?LUNGS: Normal breath sounds bilaterally, no wheezing, rales, rhonchi.  ?CARDIOVASCULAR: S1, S2 normal. No murmurs, rubs, or gallops.  ?ABDOMEN: Soft, nontender, nondistended. Bowel sounds present.  ?EXTREMITIES:  ?    ?NEUROLOGIC: nonfocal  patient is alert and awake ? ?LABORATORY PANEL:  ?CBC ?Recent Labs  ?Lab 01/04/22 ?0603  ?WBC 5.1  ?HGB 9.7*  ?HCT 29.3*  ?PLT 350  ? ? ?Chemistries  ?Recent Labs  ?Lab 01/03/22 ?1158 01/04/22 ?0603  ?NA 136 134*  ?K 5.2* 5.1  ?CL 106 109  ?CO2 18* 19*  ?GLUCOSE 133* 131*  ?BUN 29* 20  ?CREATININE 1.92* 1.47*  ?CALCIUM 9.1 8.8*  ?AST 19  --   ?ALT 12  --   ?ALKPHOS 74  --   ?BILITOT 0.5  --   ? ?Cardiac Enzymes ?No results for input(s): TROPONINI in the last 168 hours. ?RADIOLOGY:  ?MR FOOT RIGHT WO CONTRAST ? ?Result Date: 01/04/2022 ?CLINICAL DATA:  Foot pain and swelling. EXAM: MRI OF THE RIGHT FOREFOOT WITHOUT CONTRAST TECHNIQUE: Multiplanar, multisequence MR imaging of the right forefoot was performed. No intravenous contrast was administered. COMPARISON:  None Available. FINDINGS: Bones/Joint/Cartilage Prior fifth transmetatarsal amputation. Mild bone marrow edema in the fifth metatarsal stump concerning for residual osteomyelitis. Bone destruction centered around the fourth PIP joint and associated severe bone marrow edema in the fourth proximal phalanx and  fourth middle phalanx with a fourth PIP joint effusion consistent with septic arthritis and osteomyelitis. Severe bone marrow edema in the fourth distal phalanx most concerning for osteomyelitis. Dorsal dislocation of the fourth middle phalanx. Mild bone marrow edema in the second and third middle and distal phalanx concerning for osteomyelitis. No acute fracture or dislocation. Ligaments Collateral ligaments are intact.  Lisfranc ligament is intact. Muscles and Tendons Flexor, peroneal and extensor compartment tendons are intact. Generalized severe muscle atrophy. Soft tissue No fluid collection or hematoma. No soft tissue mass. Soft tissue wound along the dorsal lateral aspect of the forefoot. Soft tissue edema throughout the foot. IMPRESSION: 1. Septic arthritis and osteomyelitis of the fourth PIP joint. 2. Severe bone marrow edema in the fourth distal phalanx most concerning for osteomyelitis. 3. Mild bone marrow edema in the second and third middle and distal phalanx concerning for osteomyelitis. 4. Prior fifth transmetatarsal amputation. Mild bone marrow edema in the fifth metatarsal stump concerning for residual osteomyelitis. Electronically Signed   By: Kathreen Devoid M.D.   On: 01/04/2022 08:45  ? ?DG Chest Port 1 View ? ?Result Date: 01/03/2022 ?CLINICAL DATA:  Chronic lower extremity wounds and cellulitis. EXAM: PORTABLE CHEST 1 VIEW COMPARISON:  None Available. FINDINGS: The heart size and mediastinal contours are within normal limits allowing for very low lung volumes. Both lungs are clear allowing for low lung volumes. No pleural effusion seen. IMPRESSION: Very low lung volumes. No active disease. Electronically Signed  By: Marlaine Hind M.D.   On: 01/03/2022 16:57  ? ?DG Foot Complete Right ? ?Result Date: 01/03/2022 ?CLINICAL DATA:  infection EXAM: RIGHT FOOT COMPLETE - 3+ VIEW COMPARISON:  Foot radiograph 05/03/2021. FINDINGS: Minimally displaced fracture of the fourth proximal phalanx. Also, possible  erosion in on the medial aspect of the distal phalanx is suspicious for osteomyelitis. Osteopenia. Chronic periosteal thickening of the fourth metatarsal. Amputation of the proximal fifth metatarsal. Soft tissue swelling. Vascular calcifications. No joint malalignment. IMPRESSION: 1. Minimally displaced fracture of the fourth proximal phalanx, new since the prior and suspicious for pathologic fracture due to osteomyelitis. Also, possible erosion in on the medial aspect of the distal phalanx is suspicious for osteomyelitis. An MRI could further evaluate if clinically indicated. 2. Amputation of the proximal fifth metatarsal. Electronically Signed   By: Margaretha Sheffield M.D.   On: 01/03/2022 12:58   ? ?Assessment and Plan ?[name] is a 71 y.o. male with medical history significant of PVD, diabetes mellitus, hypertension, hyperlipidemia, right leg DVT off Xarelto, CKD-3A, lymphedema, obesity with BMI 39.05, iron deficiency anemia, who presents with right foot infection. ? ?Diabetic right foot infection with osteomyelitis: Patient has elevated lactic acid 2.8, but does not meets critical for sepsis.  ?-- Consulted Dr. Lucky Cowboy of vascular surgery and Dr. Vickki Muff of podiatry. ? ?- Empiric antimicrobial treatment with Cipro and Flagyl ?- PRN Zofran for nausea ?- Blood cultures x 2 negative ?- MRI-right foot 1. Septic arthritis and osteomyelitis of the fourth PIP joint. ?2. Severe bone marrow edema in the fourth distal phalanx most ?concerning for osteomyelitis. ?3. Mild bone marrow edema in the second and third middle and distal ?phalanx concerning for osteomyelitis. ?4. Prior fifth transmetatarsal amputation. Mild bone marrow edema in ?the fifth metatarsal stump concerning for residual osteomyelitis ? ?-- Dr. Vickki Muff plants trans metatarsal amputation  ?  ?Hyperkalemia ?Potassium 5.2 ?-received Lokelma ?  ?Iron deficiency anemia ?- Hemoglobin stable, 11.0 ?-Continue iron supplement ?  ?Acute renal failure  superimposed on stage 3a chronic kidney disease ([place]) ?--Baseline creatinine 0.91 on 05/22/2017.  No recent baseline creatinine available.  His creatinine is 1.92, BUN 29. ?-Hold Prinzide ?-IV fluid as above ?  ?Type II diabetes mellitus with renal manifestations ([place]) ?Recent A1c 8.7, well controlled.  Patient is taking Humalog, metformin and Lantus to 15 units daily ?-Sliding scale insulin ?-Glargine insulin 10 units daily ?  ?HTN (hypertension) ?- Hold lisinopril-HCTZ due to worsening renal function ?-IV hydralazine as needed ?  ?Peripheral vascular disease ([place]) ?- Consulted Dr. Lucky Cowboy of vascular surgery ?  ?Hyperlipidemia ?- Lipitor ?  ?  ? ?Procedures: ?Family communication none ?Consults : vascular surgery and podiatry ?CODE STATUS: full ?DVT Prophylaxis : heparin ?Level of care: Med-Surg ?Status is: Inpatient ?Remains inpatient appropriate because: right foot osteomyelitis pending surgery ?  ? ?TOTAL TIME TAKING CARE OF THIS PATIENT: 35 minutes.  ?>50% time spent on counselling and coordination of care ? ?Note: This dictation was prepared with Dragon dictation along with smaller phrase technology. Any transcriptional errors that result from this process are unintentional. ? ?Fritzi Mandes M.D  ? ? ?[facility]  ? ?CC: ?Primary care physician; Frazier Richards, MD  ?

## 2022-01-04 NOTE — Consult Note (Signed)
?ORTHOPAEDIC CONSULTATION ? ?REQUESTING PHYSICIAN: Fritzi Mandes, MD ? ?Chief Complaint: Right foot infection and gangrene ? ?HPI: ?[name] is a 71 y.o. male who complains of worsening wound to his right foot.  He has been followed in the wound care center for some time.  He has a longstanding history of ulcerations to his right lower leg.  Severe lymphedema.  Has been seen by vascular surgery in the past.  Developed worsening wounds to his right foot.  Was seen in the wound care center and sent to the ER for worsening infection to his right foot.  He has noticed foul odor from the area as well.  He has neuropathy. ? ?Past Medical History:  ?Diagnosis Date  ? Arthritis   ? Bladder incontinence   ? Chronic kidney disease   ? Stage 3 Kidney disease  ? Dementia ([place])   ? Diabetes mellitus without complication ([place])   ? DVT of leg (deep venous thrombosis) ([place])   ? H/O RIGHT LEG 2018  ? Dyspnea   ? occassional  ? Hyperlipidemia   ? Hypertension   ? Peripheral vascular disease ([place])   ? Sepsis ([place])   ? ?Past Surgical History:  ?Procedure Laterality Date  ? ABDOMINAL AORTOGRAM W/LOWER EXTREMITY N/A 10/10/2016  ? Procedure: Abdominal Aortogram w/Lower Extremity;  Surgeon: Katha Cabal, MD;  Location: [place] [facility];  Service: Cardiovascular;  Laterality: N/A;  ? CATARACT EXTRACTION W/PHACO Left 05/27/2018  ? Procedure: CATARACT EXTRACTION PHACO AND INTRAOCULAR LENS PLACEMENT (IOC);  Surgeon: Eulogio Bear, MD;  Location: [facility];  Service: Ophthalmology;  Laterality: Left;  [place] 03:06.2 ?AP% 16.1 ?CDE 30.16 ?FLUID PACK LOT @ [id number]  ? CIRCUMCISION  1998  ? EYE SURGERY Left   ? Retina Detachment  ? GRAFT APPLICATION Right [id number]  ? Procedure: GRAFT APPLICATION ( RIGHT FOOT );  Surgeon: Katha Cabal, MD;  Location: [facility];  Service: Vascular;  Laterality: Right;  graft taken from patients right thigh  ? IRRIGATION AND DEBRIDEMENT FOOT Right 10/03/2016  ? Procedure: IRRIGATION AND DEBRIDEMENT  FOOT;  Surgeon: Samara Deist, DPM;  Location: [facility];  Service: Podiatry;  Laterality: Right;  ? LOWER EXTREMITY INTERVENTION  10/10/2016  ? Procedure: Lower Extremity Intervention;  Surgeon: Katha Cabal, MD;  Location: [place] [facility];  Service: Cardiovascular;;  ? PERIPHERAL VASCULAR BALLOON ANGIOPLASTY Left 10/10/2016  ? Procedure: Peripheral Vascular Balloon Angioplasty;  Surgeon: Katha Cabal, MD;  Location: [place] [facility];  Service: Cardiovascular;  Laterality: Left;  ? TONSILLECTOMY    ? WOUND DEBRIDEMENT Right 11/07/2016  ? Procedure: DEBRIDEMENT OF WOUND AND BONE RIGHT FOOT AND APPLY WOUND VAC;  Surgeon: Samara Deist, DPM;  Location: [facility];  Service: Podiatry;  Laterality: Right;  ? ?Social History  ? ?Socioeconomic History  ? Marital status: Single  ?  Spouse name: Not on file  ? Number of children: Not on file  ? Years of education: Not on file  ? Highest education level: Not on file  ?Occupational History  ? Not on file  ?Tobacco Use  ? Smoking status: Former  ?  Types: Pipe  ?  Quit date: 05/23/1975  ?  Years since quitting: 46.6  ? Smokeless tobacco: Never  ?Vaping Use  ? Vaping Use: Never used  ?Substance and Sexual Activity  ? Alcohol use: No  ? Drug use: No  ? Sexual activity: Not on file  ?Other Topics Concern  ? Not on file  ?Social  History Narrative  ? Not on file  ? ?Social Determinants of Health  ? ?Financial Resource Strain: Not on file  ?Food Insecurity: Not on file  ?Transportation Needs: Not on file  ?Physical Activity: Not on file  ?Stress: Not on file  ?Social Connections: Not on file  ? ?Family History  ?Problem Relation Age of Onset  ? Diabetes Mother   ? Hypertension Mother   ? Diabetes Father   ? Hypertension Father   ? ?Allergies  ?Allergen Reactions  ? Sulfa Antibiotics Rash  ? Zosyn [Piperacillin Sod-Tazobactam So] Rash  ? ?Prior to Admission medications   ?Medication Sig Start Date End Date Taking? Authorizing Provider  ?acetaminophen (TYLENOL) 325 MG tablet  Take 650 mg by mouth every 6 (six) hours as needed.   Yes [provider]  ?atorvastatin (LIPITOR) 10 MG tablet TAKE 1 TABLET  EACH  EVENING FOR HIGH CHOLESTEROL 06/12/20  Yes [provider]  ?bacitracin ointment Apply in a thin layer to the wound of the right foot and cover with gauze and a wrap  ?Do this daily (once per day) 08/20/17  Yes Schnier, Dolores Lory, MD  ?Calcium Alginate (RESTORE CALCICARE DRESSING) MISC APPLY TO WOUNDS THEN COVER WITH FOAM DRESSING EVERY OTHER DAY 04/19/21  Yes [provider]  ?ciprofloxacin (CIPRO) 500 MG tablet Take 1 tablet (500 mg total) by mouth 2 (two) times daily for 10 days. 01/03/22 01/13/22 Yes Lucrezia Starch, MD  ?docusate sodium (COLACE) 100 MG capsule Take 1 capsule (100 mg total) by mouth 2 (two) times daily. 10/13/16  Yes Gladstone Lighter, MD  ?FEROSUL 325 (65 Fe) MG tablet Take 325 mg by mouth 2 (two) times daily with a meal. 12/26/21  Yes [provider]  ?insulin glargine (LANTUS) 100 UNIT/ML injection Inject 0.15 mLs (15 Units total) into the skin at bedtime. 10/13/16  Yes Gladstone Lighter, MD  ?insulin lispro (HUMALOG) 100 UNIT/ML injection Inject 4 Units into the skin See admin instructions. Inject 4 units SQ with breakfast, inject 4 units SQ with lunch and inject 4 units SQ with dinner   Yes [provider]  ?lisinopril-hydrochlorothiazide (PRINZIDE,ZESTORETIC) 20-25 MG tablet Take by mouth.   Yes [provider]  ?metFORMIN (GLUCOPHAGE) 1000 MG tablet TAKE 1 TABLET TWICE DAILY FOR DIABETES 06/12/20  Yes [provider]  ?polyethylene glycol (MIRALAX / GLYCOLAX) packet Take 17 g by mouth daily.   Yes [provider]  ?senna (SENOKOT) 8.6 MG TABS tablet Take 1 tablet by mouth 2 (two) times daily.   Yes [provider]  ?ACCU-CHEK GUIDE test strip Use 1 strip via meter four times a day  to monitor blood glucose 07/10/20   [provider]  ?Accu-Chek Softclix Lancets lancets   04/29/20   [provider]  ?atorvastatin (LIPITOR) 20 MG tablet Take 20 mg by mouth daily. ?Patient not taking: Reported on 01/03/2022    [provider]  ?[place] X 5/16" 0.3 ML MISC USE FIVE TIMES DAILY AS DIRECTED 06/12/20   [provider]  ?empagliflozin (JARDIANCE) 10 MG TABS tablet  03/15/21   [provider]  ?glucose blood (ACCU-CHEK AVIVA PLUS) test strip  04/29/20   [provider]  ?Insulin Syringe-Needle U-100 31G X 5/16" 0.3 ML MISC  06/05/20   [provider]  ?metFORMIN (GLUCOPHAGE) 500 MG tablet Take 500 mg by mouth 2 (two) times daily with a meal.  ?Patient not taking: Reported on 01/03/2022    [provider]  ?  Multiple Vitamins-Minerals (CENTROVITE) TABS Take 1 tablet by mouth daily.    [provider]  ?silver sulfADIAZINE (SILVADENE) 1 % cream Apply 1 application topically daily. Apply to right foot ulcer BID ?Patient not taking: Reported on 01/03/2022 09/24/20   Schnier, Dolores Lory, MD  ?Simethicone (GAS-X ULTRA STRENGTH PO) Take 1 capsule by mouth 4 (four) times daily as needed (for flatulence).    [provider]  ? ?DG Chest Port 1 View ? ?Result Date: 01/03/2022 ?CLINICAL DATA:  Chronic lower extremity wounds and cellulitis. EXAM: PORTABLE CHEST 1 VIEW COMPARISON:  None Available. FINDINGS: The heart size and mediastinal contours are within normal limits allowing for very low lung volumes. Both lungs are clear allowing for low lung volumes. No pleural effusion seen. IMPRESSION: Very low lung volumes. No active disease. Electronically Signed   By: Marlaine Hind M.D.   On: 01/03/2022 16:57  ? ?DG Foot Complete Right ? ?Result Date: 01/03/2022 ?CLINICAL DATA:  infection EXAM: RIGHT FOOT COMPLETE - 3+ VIEW COMPARISON:  Foot radiograph 05/03/2021. FINDINGS: Minimally displaced fracture of the fourth proximal phalanx. Also, possible erosion in on the medial aspect of the distal phalanx is suspicious for  osteomyelitis. Osteopenia. Chronic periosteal thickening of the fourth metatarsal. Amputation of the proximal fifth metatarsal. Soft tissue swelling. Vascular calcifications. No joint malalignment. IMPRESSION: 1.

## 2022-01-04 NOTE — Consult Note (Addendum)
? ?[facility] [place] ? ?Patient name: [name] MRN: [id number] DOB: 1951-03-05 Sex: male ? ? ?REQUESTING PROVIDER:  ? ? Dr. Vickki Muff ? ? ?REASON FOR CONSULT:  ?  ?Rig foot wound ? ?HISTORY OF PRESENT ILLNESS:  ? ?[name] is a 71 y.o. male, who has a nonhealing wound to his right foot that is chronic and has been treated at the wound center.  He was seen by Dr. Vickki Muff for worsening infection in the ER yesterday.  The wound had a foul odor associated with it.  Transmetatarsal amputation has been recommended which will be performed tomorrow. ? ?The patient has a history of a right leg DVT in 2018.  He has chronic lymphedema which has been managed by Dr. Delana Meyer with a lymph pump.  He underwent angiography in 2018 and had three-vessel runoff with diffuse disease out onto the foot.  No intervention was performed.  The patient is a diabetic with stage III renal insufficiency.  He is a former smoker.  He takes a statin for hypercholesterolemia.  He is medically managed for hypertension. ? ?PAST MEDICAL HISTORY  ? ? ?Past Medical History:  ?Diagnosis Date  ? Arthritis   ? Bladder incontinence   ? Chronic kidney disease   ? Stage 3 Kidney disease  ? Dementia ([place])   ? Diabetes mellitus without complication ([place])   ? DVT of leg (deep venous thrombosis) ([place])   ? H/O RIGHT LEG 2018  ? Dyspnea   ? occassional  ? Hyperlipidemia   ? Hypertension   ? Peripheral vascular disease ([place])   ? Sepsis ([place])   ? ? ? ?FAMILY HISTORY  ? ?Family History  ?Problem Relation Age of Onset  ? Diabetes Mother   ? Hypertension Mother   ? Diabetes Father   ? Hypertension Father   ? ? ?SOCIAL HISTORY:  ? ?Social History  ? ?Socioeconomic History  ? Marital status: Single  ?  Spouse name: Not on file  ? Number of children: Not on file  ? Years of education: Not on file  ? Highest education level: Not on file  ?Occupational History  ? Not on file  ?Tobacco Use  ? Smoking status:  Former  ?  Types: Pipe  ?  Quit date: 05/23/1975  ?  Years since quitting: 46.6  ? Smokeless tobacco: Never  ?Vaping Use  ? Vaping Use: Never used  ?Substance and Sexual Activity  ? Alcohol use: No  ? Drug use: No  ? Sexual activity: Not on file  ?Other Topics Concern  ? Not on file  ?Social History Narrative  ? Not on file  ? ?Social Determinants of Health  ? ?Financial Resource Strain: Not on file  ?Food Insecurity: Not on file  ?Transportation Needs: Not on file  ?Physical Activity: Not on file  ?Stress: Not on file  ?Social Connections: Not on file  ?Intimate Partner Violence: Not on file  ? ? ?ALLERGIES:  ? ? ?Allergies  ?Allergen Reactions  ? Sulfa Antibiotics Rash  ? Zosyn [Piperacillin Sod-Tazobactam So] Rash  ? ? ?CURRENT MEDICATIONS:  ? ? ?Current Facility-Administered Medications  ?Medication Dose Route Frequency Provider Last Rate Last Admin  ? acetaminophen (TYLENOL) tablet 650 mg  650 mg Oral Q6H PRN Ivor Costa, MD      ? albuterol (PROVENTIL) (2.5 MG/3ML) 0.083% nebulizer solution 3 mL  3 mL Inhalation Q4H PRN Ivor Costa, MD      ? atorvastatin (LIPITOR) tablet 10  mg  10 mg Oral Daily Ivor Costa, MD   10 mg at 01/04/22 [id number]  ? ciprofloxacin (CIPRO) IVPB 400 mg  400 mg Intravenous Q12H Fritzi Mandes, MD   Stopped at 01/04/22 1030  ? dextromethorphan-guaiFENesin ([place] DM) 30-600 MG per 12 hr tablet 1 tablet  1 tablet Oral BID PRN Ivor Costa, MD      ? docusate sodium (COLACE) capsule 100 mg  100 mg Oral BID PRN Ivor Costa, MD      ? ferrous sulfate tablet 325 mg  325 mg Oral BID WC Ivor Costa, MD   325 mg at 01/04/22 1706  ? heparin injection 5,000 Units  5,000 Units Subcutaneous Cleophas Dunker, MD   5,000 Units at 01/04/22 1501  ? hydrALAZINE (APRESOLINE) injection 5 mg  5 mg Intravenous Q2H PRN Ivor Costa, MD      ? insulin aspart (novoLOG) injection 0-5 Units  0-5 Units Subcutaneous QHS Ivor Costa, MD      ? insulin aspart (novoLOG) injection 0-9 Units  0-9 Units Subcutaneous TID WC Ivor Costa, MD    2 Units at 01/04/22 1706  ? insulin glargine-yfgn (SEMGLEE) injection 10 Units  10 Units Subcutaneous QHS Ivor Costa, MD   10 Units at 01/03/22 2113  ? metroNIDAZOLE (FLAGYL) IVPB 500 mg  500 mg Intravenous Q12H Ivor Costa, MD 100 mL/hr at 01/04/22 1506 500 mg at 01/04/22 1506  ? multivitamin with minerals tablet 1 tablet  1 tablet Oral Daily Ivor Costa, MD   1 tablet at 01/04/22 0841  ? ondansetron (ZOFRAN) injection 4 mg  4 mg Intravenous Q8H PRN Ivor Costa, MD      ? simethicone (MYLICON) chewable tablet 80 mg  80 mg Oral QID PRN Ivor Costa, MD      ? ? ?REVIEW OF SYSTEMS:  ? ?[X]  denotes positive finding, [ ]  denotes negative finding ?Cardiac  Comments:  ?Chest pain or chest pressure:    ?Shortness of breath upon exertion:    ?Short of breath when lying flat:    ?Irregular heart rhythm:    ?    ?Vascular    ?Pain in calf, thigh, or hip brought on by ambulation:    ?Pain in feet at night that wakes you up from your sleep:     ?Blood clot in your veins:    ?Leg swelling:     ?    ?Pulmonary    ?Oxygen at home:    ?Productive cough:     ?Wheezing:     ?    ?Neurologic    ?Sudden weakness in arms or legs:     ?Sudden numbness in arms or legs:     ?Sudden onset of difficulty speaking or slurred speech:    ?Temporary loss of vision in one eye:     ?Problems with dizziness:     ?    ?Gastrointestinal    ?Blood in stool:     ? ?Vomited blood:     ?    ?Genitourinary    ?Burning when urinating:     ?Blood in urine:    ?    ?Psychiatric    ?Major depression:     ?    ?Hematologic    ?Bleeding problems:    ?Problems with blood clotting too easily:    ?    ?Skin    ?Rashes or ulcers: x   ?    ?Constitutional    ?Fever or chills:    ? ?PHYSICAL EXAM:  ? ?  Vitals:  ? 01/04/22 0450 01/04/22 0828 01/04/22 1223 01/04/22 1545  ?BP: 124/64 (!) 104/54 104/70 (!) 110/58  ?Pulse: 86 83 84 86  ?Resp: 20 18 18 18   ?Temp: 98.3 ?F (36.8 ?C) 98.3 ?F (36.8 ?C) 98.9 ?F (37.2 ?C) 99 ?F (37.2 ?C)  ?TempSrc:  Oral Oral Oral  ?SpO2: 94% 98% 98%  100%  ?Weight:      ?Height:      ? ? ?GENERAL: The patient is a well-nourished male, in no acute distress. The vital signs are documented above. ?CARDIAC: There is a regular rate and rhythm.  ?VASCULAR: Palpable right dorsalis pedis pulse.  Chronic right leg edema ?PULMONARY: Nonlabored respirations ?ABDOMEN: Soft and non-tender with normal pitched bowel sounds.  ?MUSCULOSKELETAL: There are no major deformities or cyanosis. ?NEUROLOGIC: No focal weakness or paresthesias are detected. ?SKIN: See photo below ?PSYCHIATRIC: The patient has a normal affect. ? ? ?STUDIES:  ? ?I have reviewed his MRI with the following findings: ?1. Septic arthritis and osteomyelitis of the fourth PIP joint. ?2. Severe bone marrow edema in the fourth distal phalanx most ?concerning for osteomyelitis. ?3. Mild bone marrow edema in the second and third middle and distal ?phalanx concerning for osteomyelitis. ?4. Prior fifth transmetatarsal amputation. Mild bone marrow edema in ?the fifth metatarsal stump concerning for residual osteomyelitis. ?  ?  ?  ? ?ASSESSMENT and PLAN  ? ?Right diabetic foot infection: The patient is scheduled for transmetatarsal amputation tomorrow by Dr. Vickki Muff.  He does have a palpable dorsalis pedis pulse on the right.  Angiography in 2018 did not show any correctable disease down to the ankle.  His disease was limited to the foot.  He has segmental studies pending.  I will await operative findings regarding bleeding.  If there is minimal blood flow at the time of his amputation, we will consider angiography. ? ? ?Annamarie Major, IV, MD, FACS ?[facility] [place] ?Tel [phone] ?Pager [phone]  ?

## 2022-01-05 ENCOUNTER — Other Ambulatory Visit: Payer: Self-pay

## 2022-01-05 ENCOUNTER — Encounter: Payer: Self-pay | Admitting: Internal Medicine

## 2022-01-05 ENCOUNTER — Encounter
Admission: EM | Disposition: A | Discharge status: Skilled Nursing Facility | Payer: Self-pay | Source: Ambulatory Visit | Attending: Internal Medicine

## 2022-01-05 ENCOUNTER — Inpatient Hospital Stay: Payer: Medicare HMO | Admitting: Anesthesiology

## 2022-01-05 DIAGNOSIS — L089 Local infection of the skin and subcutaneous tissue, unspecified: Secondary | ICD-10-CM | POA: Diagnosis not present

## 2022-01-05 DIAGNOSIS — E11628 Type 2 diabetes mellitus with other skin complications: Secondary | ICD-10-CM | POA: Diagnosis not present

## 2022-01-05 HISTORY — PX: TRANSMETATARSAL AMPUTATION: SHX6197

## 2022-01-05 LAB — BLOOD CULTURE ID PANEL (REFLEXED) - BCID2
A.calcoaceticus-baumannii: NOT DETECTED
A.calcoaceticus-baumannii: NOT DETECTED
Bacteroides fragilis: NOT DETECTED
Bacteroides fragilis: NOT DETECTED
Candida albicans: NOT DETECTED
Candida albicans: NOT DETECTED
Candida auris: NOT DETECTED
Candida auris: NOT DETECTED
Candida glabrata: NOT DETECTED
Candida glabrata: NOT DETECTED
Candida krusei: NOT DETECTED
Candida krusei: NOT DETECTED
Candida parapsilosis: NOT DETECTED
Candida parapsilosis: NOT DETECTED
Candida tropicalis: NOT DETECTED
Candida tropicalis: NOT DETECTED
Cryptococcus neoformans/gattii: NOT DETECTED
Cryptococcus neoformans/gattii: NOT DETECTED
Enterobacter cloacae complex: NOT DETECTED
Enterobacter cloacae complex: NOT DETECTED
Enterobacterales: NOT DETECTED
Enterobacterales: NOT DETECTED
Enterococcus Faecium: NOT DETECTED
Enterococcus Faecium: NOT DETECTED
Enterococcus faecalis: NOT DETECTED
Enterococcus faecalis: NOT DETECTED
Escherichia coli: NOT DETECTED
Escherichia coli: NOT DETECTED
Haemophilus influenzae: NOT DETECTED
Haemophilus influenzae: NOT DETECTED
Klebsiella aerogenes: NOT DETECTED
Klebsiella aerogenes: NOT DETECTED
Klebsiella oxytoca: NOT DETECTED
Klebsiella oxytoca: NOT DETECTED
Klebsiella pneumoniae: NOT DETECTED
Klebsiella pneumoniae: NOT DETECTED
Listeria monocytogenes: NOT DETECTED
Listeria monocytogenes: NOT DETECTED
Neisseria meningitidis: NOT DETECTED
Neisseria meningitidis: NOT DETECTED
Proteus species: NOT DETECTED
Proteus species: NOT DETECTED
Pseudomonas aeruginosa: NOT DETECTED
Pseudomonas aeruginosa: NOT DETECTED
Salmonella species: NOT DETECTED
Salmonella species: NOT DETECTED
Serratia marcescens: NOT DETECTED
Serratia marcescens: NOT DETECTED
Staphylococcus aureus (BCID): NOT DETECTED
Staphylococcus aureus (BCID): NOT DETECTED
Staphylococcus epidermidis: NOT DETECTED
Staphylococcus epidermidis: NOT DETECTED
Staphylococcus lugdunensis: NOT DETECTED
Staphylococcus lugdunensis: NOT DETECTED
Staphylococcus species: NOT DETECTED
Staphylococcus species: NOT DETECTED
Stenotrophomonas maltophilia: NOT DETECTED
Stenotrophomonas maltophilia: NOT DETECTED
Streptococcus agalactiae: NOT DETECTED
Streptococcus agalactiae: NOT DETECTED
Streptococcus pneumoniae: NOT DETECTED
Streptococcus pneumoniae: NOT DETECTED
Streptococcus pyogenes: NOT DETECTED
Streptococcus pyogenes: NOT DETECTED
Streptococcus species: NOT DETECTED
Streptococcus species: NOT DETECTED

## 2022-01-05 LAB — GLUCOSE, CAPILLARY
Glucose-Capillary: 148 mg/dL — ABNORMAL HIGH (ref 70–99)
Glucose-Capillary: 170 mg/dL — ABNORMAL HIGH (ref 70–99)
Glucose-Capillary: 192 mg/dL — ABNORMAL HIGH (ref 70–99)
Glucose-Capillary: 218 mg/dL — ABNORMAL HIGH (ref 70–99)

## 2022-01-05 LAB — PROCALCITONIN: Procalcitonin: <0.1 ng/mL

## 2022-01-05 SURGERY — AMPUTATION, FOOT, TRANSMETATARSAL
Anesthesia: General | Site: Foot | Laterality: Right

## 2022-01-05 MED ORDER — HYDROMORPHONE HCL 1 MG/ML IJ SOLN
INTRAMUSCULAR | Status: AC
  Filled 2022-01-05: qty 1

## 2022-01-05 MED ORDER — LIDOCAINE HCL (CARDIAC) PF 100 MG/5ML IV SOSY
PREFILLED_SYRINGE | INTRAVENOUS | Status: DC | PRN
  Administered 2022-01-05: 100 mg via INTRAVENOUS

## 2022-01-05 MED ORDER — 0.9 % SODIUM CHLORIDE (POUR BTL) OPTIME
TOPICAL | Status: DC | PRN
Start: 2022-01-05 — End: 2022-01-05
  Administered 2022-01-05: 1000 mL

## 2022-01-05 MED ORDER — SODIUM CHLORIDE 0.9 % IV SOLN: INTRAVENOUS | Status: DC | PRN

## 2022-01-05 MED ORDER — BUPIVACAINE HCL (PF) 0.5 % IJ SOLN
INTRAMUSCULAR | Status: DC | PRN
Start: 2022-01-05 — End: 2022-01-05
  Administered 2022-01-05: 10 mL

## 2022-01-05 MED ORDER — LIDOCAINE HCL 1 % IJ SOLN
INTRAMUSCULAR | Status: DC | PRN
  Administered 2022-01-05: 10 mL

## 2022-01-05 MED ORDER — ONDANSETRON HCL 4 MG/2ML IJ SOLN
INTRAMUSCULAR | Status: AC
  Filled 2022-01-05: qty 2

## 2022-01-05 MED ORDER — ONDANSETRON HCL 4 MG/2ML IJ SOLN
INTRAMUSCULAR | Status: DC | PRN
  Administered 2022-01-05: 4 mg via INTRAVENOUS

## 2022-01-05 MED ORDER — FENTANYL CITRATE (PF) 100 MCG/2ML IJ SOLN
INTRAMUSCULAR | Status: DC | PRN
Start: 2022-01-05 — End: 2022-01-05
  Administered 2022-01-05: 100 ug via INTRAVENOUS

## 2022-01-05 MED ORDER — HYDROMORPHONE HCL 1 MG/ML IJ SOLN
INTRAMUSCULAR | Status: DC | PRN
  Administered 2022-01-05 (×2): .5 mg via INTRAVENOUS

## 2022-01-05 MED ORDER — FENTANYL CITRATE (PF) 100 MCG/2ML IJ SOLN
INTRAMUSCULAR | Status: AC
  Filled 2022-01-05: qty 2

## 2022-01-05 MED ORDER — ONDANSETRON HCL 4 MG/2ML IJ SOLN: 4.0000 mg | Freq: Once | INTRAMUSCULAR | Status: DC | PRN

## 2022-01-05 MED ORDER — SURGIFLO WITH THROMBIN (HEMOSTATIC MATRIX KIT) OPTIME
TOPICAL | Status: DC | PRN
  Administered 2022-01-05: 1 via TOPICAL

## 2022-01-05 MED ORDER — FENTANYL CITRATE (PF) 100 MCG/2ML IJ SOLN: 25.0000 ug | INTRAMUSCULAR | Status: DC | PRN

## 2022-01-05 MED ORDER — PROPOFOL 10 MG/ML IV BOLUS
INTRAVENOUS | Status: DC | PRN
  Administered 2022-01-05: 160 mg via INTRAVENOUS

## 2022-01-05 SURGICAL SUPPLY — 43 items
BAG COUNTER SPONGE SURGICOUNT (BAG) IMPLANT
BLADE OSC/SAGITTAL 5.5X25 (BLADE) ×2 IMPLANT
BLADE SURG 10 STRL SS (BLADE) ×3 IMPLANT
BNDG COHESIVE 4X5 TAN ST LF (GAUZE/BANDAGES/DRESSINGS) ×2 IMPLANT
BNDG ELASTIC 4X5.8 VLCR NS LF (GAUZE/BANDAGES/DRESSINGS) ×2 IMPLANT
BNDG ELASTIC 4X5.8 VLCR STR LF (GAUZE/BANDAGES/DRESSINGS) ×1 IMPLANT
BNDG ESMARK 4X12 TAN STRL LF (GAUZE/BANDAGES/DRESSINGS) ×2 IMPLANT
BNDG GAUZE ELAST 4 BULKY (GAUZE/BANDAGES/DRESSINGS) ×2 IMPLANT
BNDG STRETCH 4X75 STRL LF (GAUZE/BANDAGES/DRESSINGS) ×2 IMPLANT
CNTNR SPEC 2.5X3XGRAD LEK (MISCELLANEOUS) ×1
CONT SPEC 4OZ STER OR WHT (MISCELLANEOUS) ×1
CONTAINER SPEC 2.5X3XGRAD LEK (MISCELLANEOUS) ×1 IMPLANT
CUFF TOURN SGL QUICK 12 (TOURNIQUET CUFF) ×1 IMPLANT
DRAIN PENROSE 12X.25 LTX STRL (MISCELLANEOUS) IMPLANT
DURAPREP 26ML APPLICATOR (WOUND CARE) ×2 IMPLANT
ELECT REM PT RETURN 9FT ADLT (ELECTROSURGICAL) ×2
ELECTRODE REM PT RTRN 9FT ADLT (ELECTROSURGICAL) ×1 IMPLANT
GAUZE SPONGE 4X4 12PLY STRL (GAUZE/BANDAGES/DRESSINGS) ×3 IMPLANT
GAUZE XEROFORM 1X8 LF (GAUZE/BANDAGES/DRESSINGS) ×2 IMPLANT
GLOVE SURG ENC MOIS LTX SZ7.5 (GLOVE) ×2 IMPLANT
GLOVE SURG UNDER LTX SZ8 (GLOVE) ×2 IMPLANT
GOWN STRL REUS W/ TWL XL LVL3 (GOWN DISPOSABLE) ×2 IMPLANT
GOWN STRL REUS W/TWL XL LVL3 (GOWN DISPOSABLE) ×2
HANDLE YANKAUER SUCT BULB TIP (MISCELLANEOUS) ×1 IMPLANT
KIT TURNOVER KIT A (KITS) ×2 IMPLANT
LABEL OR SOLS (LABEL) ×1 IMPLANT
MANIFOLD NEPTUNE II (INSTRUMENTS) ×2 IMPLANT
NDL HYPO 25X1 1.5 SAFETY (NEEDLE) ×1 IMPLANT
NDL SAFETY ECLIPSE 18X1.5 (NEEDLE) ×1 IMPLANT
NEEDLE HYPO 18GX1.5 SHARP (NEEDLE) ×1
NEEDLE HYPO 25X1 1.5 SAFETY (NEEDLE) ×2 IMPLANT
NS IRRIG 1000ML POUR BTL (IV SOLUTION) ×1 IMPLANT
PACK EXTREMITY ARMC (MISCELLANEOUS) ×2 IMPLANT
PAD ABD DERMACEA PRESS 5X9 (GAUZE/BANDAGES/DRESSINGS) ×3 IMPLANT
SOL PREP PVP 2OZ (MISCELLANEOUS) ×2
SOLUTION PREP PVP 2OZ (MISCELLANEOUS) ×1 IMPLANT
SPONGE T-LAP 18X18 ~~LOC~~+RFID (SPONGE) ×3 IMPLANT
STOCKINETTE M/LG 89821 (MISCELLANEOUS) ×2 IMPLANT
SUT ETHILON 2 0 FS 18 (SUTURE) ×5 IMPLANT
SUT ETHILON 3-0 FS-10 30 BLK (SUTURE) ×2
SUTURE EHLN 3-0 FS-10 30 BLK (SUTURE) ×1 IMPLANT
SYR 10ML LL (SYRINGE) ×4 IMPLANT
WATER STERILE IRR 500ML POUR (IV SOLUTION) ×2 IMPLANT

## 2022-01-05 NOTE — Progress Notes (Signed)
[place] at [facility] ? ? ?PATIENT NAME: [name]   ? ?MR#:  [id number] ? ?DATE OF BIRTH:  07-01-1951 ? ?SUBJECTIVE:  ?came in with complains of worsening wound to his right foot. He has been followed to the wound care center for some time. Has history of lymphedema and diabetes. ?MRI positive for osteomyelitis ? ?NPO for surgery today ? ? ?VITALS:  ?Blood pressure 127/70, pulse 71, temperature 98.3 ?F (36.8 ?C), resp. rate 16, height 5\' 11"  (1.803 m), weight 127 kg, SpO2 93 %. ? ?PHYSICAL EXAMINATION:  ? ?GENERAL:  71 y.o.-year-old patient lying in the bed with no acute distress.  ?LUNGS: Normal breath sounds bilaterally ?CARDIOVASCULAR: S1, S2 normal. No murmurs  ?ABDOMEN: Soft, nontender, nondistended. ?EXTREMITIES:  ?    ?NEUROLOGIC: nonfocal  patient is alert and awake ? ?LABORATORY PANEL:  ?CBC ?Recent Labs  ?Lab 01/04/22 ?0603  ?WBC 5.1  ?HGB 9.7*  ?HCT 29.3*  ?PLT 350  ? ? ? ?Chemistries  ?Recent Labs  ?Lab 01/03/22 ?1158 01/04/22 ?0603  ?NA 136 134*  ?K 5.2* 5.1  ?CL 106 109  ?CO2 18* 19*  ?GLUCOSE 133* 131*  ?BUN 29* 20  ?CREATININE 1.92* 1.47*  ?CALCIUM 9.1 8.8*  ?AST 19  --   ?ALT 12  --   ?ALKPHOS 74  --   ?BILITOT 0.5  --   ? ? ?Cardiac Enzymes ?No results for input(s): TROPONINI in the last 168 hours. ?RADIOLOGY:  ?MR FOOT RIGHT WO CONTRAST ? ?Result Date: 01/04/2022 ?CLINICAL DATA:  Foot pain and swelling. EXAM: MRI OF THE RIGHT FOREFOOT WITHOUT CONTRAST TECHNIQUE: Multiplanar, multisequence MR imaging of the right forefoot was performed. No intravenous contrast was administered. COMPARISON:  None Available. FINDINGS: Bones/Joint/Cartilage Prior fifth transmetatarsal amputation. Mild bone marrow edema in the fifth metatarsal stump concerning for residual osteomyelitis. Bone destruction centered around the fourth PIP joint and associated severe bone marrow edema in the fourth proximal phalanx and fourth middle phalanx with a fourth PIP joint effusion consistent with  septic arthritis and osteomyelitis. Severe bone marrow edema in the fourth distal phalanx most concerning for osteomyelitis. Dorsal dislocation of the fourth middle phalanx. Mild bone marrow edema in the second and third middle and distal phalanx concerning for osteomyelitis. No acute fracture or dislocation. Ligaments Collateral ligaments are intact.  Lisfranc ligament is intact. Muscles and Tendons Flexor, peroneal and extensor compartment tendons are intact. Generalized severe muscle atrophy. Soft tissue No fluid collection or hematoma. No soft tissue mass. Soft tissue wound along the dorsal lateral aspect of the forefoot. Soft tissue edema throughout the foot. IMPRESSION: 1. Septic arthritis and osteomyelitis of the fourth PIP joint. 2. Severe bone marrow edema in the fourth distal phalanx most concerning for osteomyelitis. 3. Mild bone marrow edema in the second and third middle and distal phalanx concerning for osteomyelitis. 4. Prior fifth transmetatarsal amputation. Mild bone marrow edema in the fifth metatarsal stump concerning for residual osteomyelitis. Electronically Signed   By: [name] M.D.   On: 01/04/2022 08:45  ? ?DG Chest Port 1 View ? ?Result Date: 01/03/2022 ?CLINICAL DATA:  Chronic lower extremity wounds and cellulitis. EXAM: PORTABLE CHEST 1 VIEW COMPARISON:  None Available. FINDINGS: The heart size and mediastinal contours are within normal limits allowing for very low lung volumes. Both lungs are clear allowing for low lung volumes. No pleural effusion seen. IMPRESSION: Very low lung volumes. No active disease. Electronically Signed   By: [name].D.  On: 01/03/2022 16:57  ? ?DG Foot Complete Right ? ?Result Date: 01/03/2022 ?CLINICAL DATA:  infection EXAM: RIGHT FOOT COMPLETE - 3+ VIEW COMPARISON:  Foot radiograph 05/03/2021. FINDINGS: Minimally displaced fracture of the fourth proximal phalanx. Also, possible erosion in on the medial aspect of the distal phalanx is suspicious for  osteomyelitis. Osteopenia. Chronic periosteal thickening of the fourth metatarsal. Amputation of the proximal fifth metatarsal. Soft tissue swelling. Vascular calcifications. No joint malalignment. IMPRESSION: 1. Minimally displaced fracture of the fourth proximal phalanx, new since the prior and suspicious for pathologic fracture due to osteomyelitis. Also, possible erosion in on the medial aspect of the distal phalanx is suspicious for osteomyelitis. An MRI could further evaluate if clinically indicated. 2. Amputation of the proximal fifth metatarsal. Electronically Signed   By: [name] M.D.   On: 01/03/2022 12:58   ? ?Assessment and Plan ?[name] is a 71 y.o. male with medical history significant of PVD, diabetes mellitus, hypertension, hyperlipidemia, right leg DVT off Xarelto, CKD-3A, lymphedema, obesity with BMI 39.05, iron deficiency anemia, who presents with right foot infection. ? ?Diabetic right foot infection with osteomyelitis: Patient has elevated lactic acid 2.8, but does not meets critical for sepsis.  ?-- Consulted [name] of vascular surgery and [name] of podiatry. ? ?- Empiric antimicrobial treatment with Cipro and Flagyl ?- PRN Zofran for nausea ?- Blood cultures x 2 negative ?- MRI-right foot 1. Septic arthritis and osteomyelitis of the fourth PIP joint. ?2. Severe bone marrow edema in the fourth distal phalanx most ?concerning for osteomyelitis. ?3. Mild bone marrow edema in the second and third middle and distal ?phalanx concerning for osteomyelitis. ?4. Prior fifth transmetatarsal amputation. Mild bone marrow edema in ?the fifth metatarsal stump concerning for residual osteomyelitis ? ?-- [name] plants trans metatarsal amputation today ?  ?Hyperkalemia ?Potassium 5.2 ?-received Lokelma ?  ?Iron deficiency anemia ?- Hemoglobin stable, 11.0 ?-Continue iron supplement ?  ?Acute renal failure superimposed on stage 3a chronic kidney disease ([place]) ?--Baseline creatinine  0.91 on 05/22/2017.  No recent baseline creatinine available.  His creatinine is 1.92, BUN 29. ?-Hold Prinzide ?-IV fluid as above ?  ?Type II diabetes mellitus with renal manifestations ([place]) ?Recent A1c 8.7, well controlled.  Patient is taking Humalog, metformin and Lantus to 15 units daily ?-Sliding scale insulin ?-Glargine insulin 10 units daily ?  ?HTN (hypertension) ?- Hold lisinopril-HCTZ due to worsening renal function ?-IV hydralazine as needed ?  ?Peripheral vascular disease ([place]) ?- Consulted vascular surgery--will consider LE angiogram depending on blood flow noted during amputation ?  ?Hyperlipidemia ?- Lipitor ?  ?  ? ?Procedures: ?Family communication none ?Consults : vascular surgery and podiatry ?CODE STATUS: full ?DVT Prophylaxis : heparin ?Level of care: Med-Surg ?Status is: Inpatient ?Remains inpatient appropriate because: right foot osteomyelitis pending surgery ?  ? ?TOTAL TIME TAKING CARE OF THIS PATIENT: 35 minutes.  ?>50% time spent on counselling and coordination of care ? ?Note: This dictation was prepared with Dragon dictation along with smaller phrase technology. Any transcriptional errors that result from this process are unintentional. ? ?Fritzi Mandes M.D  ? ? ?[facility]  ? ?CC: ?Primary care physician; Frazier Richards, MD  ?

## 2022-01-05 NOTE — Progress Notes (Signed)
PHARMACY - PHYSICIAN COMMUNICATION ?CRITICAL VALUE ALERT - BLOOD CULTURE IDENTIFICATION (BCID) ? ?[name] is an 71 y.o. male who presented to [facility] on 01/03/2022 admitted with osteomyelitis.  ? ?Assessment:  71 year old male admitted with osteomyelitis. Currently on ciprofloxacin and metronidazole. Blood culture growing GPCs in 2/4 bottles (anaerobic bottles only). BCID not detected. Plan for amputation 5/7. ? ?Name of physician (or Provider) Contacted: Dr. Allena Katz ? ?Current antibiotics: ciprofloxacin 400 mg every 12 hours, metronidazole 500 mg every 12 hours ? ?Changes to prescribed antibiotics recommended:  ?Patient is on recommended antibiotics - No changes needed ? ?Follow up antibiotics post-amputation, and future BCID results. ? ?Results for orders placed or performed during the hospital encounter of 01/03/22  ?Blood Culture ID Panel (Reflexed) (Collected: 01/03/2022 12:30 PM)  ?Result Value Ref Range  ? Enterococcus faecalis NOT DETECTED NOT DETECTED  ? Enterococcus Faecium NOT DETECTED NOT DETECTED  ? Listeria monocytogenes NOT DETECTED NOT DETECTED  ? Staphylococcus species NOT DETECTED NOT DETECTED  ? Staphylococcus aureus (BCID) NOT DETECTED NOT DETECTED  ? Staphylococcus epidermidis NOT DETECTED NOT DETECTED  ? Staphylococcus lugdunensis NOT DETECTED NOT DETECTED  ? Streptococcus species NOT DETECTED NOT DETECTED  ? Streptococcus agalactiae NOT DETECTED NOT DETECTED  ? Streptococcus pneumoniae NOT DETECTED NOT DETECTED  ? Streptococcus pyogenes NOT DETECTED NOT DETECTED  ? A.calcoaceticus-baumannii NOT DETECTED NOT DETECTED  ? Bacteroides fragilis NOT DETECTED NOT DETECTED  ? Enterobacterales NOT DETECTED NOT DETECTED  ? Enterobacter cloacae complex NOT DETECTED NOT DETECTED  ? Escherichia coli NOT DETECTED NOT DETECTED  ? Klebsiella aerogenes NOT DETECTED NOT DETECTED  ? Klebsiella oxytoca NOT DETECTED NOT DETECTED  ? Klebsiella pneumoniae NOT DETECTED NOT DETECTED  ? Proteus species NOT  DETECTED NOT DETECTED  ? Salmonella species NOT DETECTED NOT DETECTED  ? Serratia marcescens NOT DETECTED NOT DETECTED  ? Haemophilus influenzae NOT DETECTED NOT DETECTED  ? Neisseria meningitidis NOT DETECTED NOT DETECTED  ? Pseudomonas aeruginosa NOT DETECTED NOT DETECTED  ? Stenotrophomonas maltophilia NOT DETECTED NOT DETECTED  ? Candida albicans NOT DETECTED NOT DETECTED  ? Candida auris NOT DETECTED NOT DETECTED  ? Candida glabrata NOT DETECTED NOT DETECTED  ? Candida krusei NOT DETECTED NOT DETECTED  ? Candida parapsilosis NOT DETECTED NOT DETECTED  ? Candida tropicalis NOT DETECTED NOT DETECTED  ? Cryptococcus neoformans/gattii NOT DETECTED NOT DETECTED  ? ? ?Jaynie Bream ?01/05/2022  3:12 PM ? ?

## 2022-01-05 NOTE — Anesthesia Preprocedure Evaluation (Signed)
Anesthesia Evaluation  ?Patient identified by MRN, date of birth, ID band ?Patient awake ? ? ? ?Reviewed: ?Allergy & Precautions, H&P , NPO status , Patient's Chart, lab work & pertinent test results, reviewed documented beta blocker date and time  ? ?History of Anesthesia Complications ?Negative for: history of anesthetic complications ? ?Airway ?Mallampati: II ? ?TM Distance: >3 FB ?Neck ROM: full ? ? ? Dental ? ?(+) Poor Dentition, Missing, Dental Advidsory Given ?  ?Pulmonary ?neg pulmonary ROS, former smoker,  ?  ? ? ? ? ? ? ? Cardiovascular ?Exercise Tolerance: Good ?hypertension, (-) angina(-) CAD, (-) Past MI, (-) Cardiac Stents and (-) CABG (-) dysrhythmias (-) Valvular Problems/Murmurs ? ? ?  ?Neuro/Psych ?PSYCHIATRIC DISORDERS Dementia negative neurological ROS ?   ? GI/Hepatic ?negative GI ROS, Neg liver ROS,   ?Endo/Other  ?diabetes ? Renal/GU ?CRFRenal disease  ?negative genitourinary ?  ?Musculoskeletal ? ? Abdominal ?  ?Peds ? Hematology ?negative hematology ROS ?(+)   ?Anesthesia Other Findings ?Past Medical History: ?No date: Diabetes mellitus without complication ([place]) ?No date: Hypertension ? ? Reproductive/Obstetrics ?negative OB ROS ? ?  ? ? ? ? ? ? ? ? ? ? ? ? ? ?  ?  ? ? ? ? ? ? ? ? ?Anesthesia Physical ? ?Anesthesia Plan ? ?ASA: 3 ? ?Anesthesia Plan: General  ? ?Post-op Pain Management:   ? ?Induction: Intravenous ? ?PONV Risk Score and Plan: 2 and Ondansetron, Midazolam and Treatment may vary due to age or medical condition ? ?Airway Management Planned: LMA ? ?Additional Equipment:  ? ?Intra-op Plan:  ? ?Post-operative Plan: Extubation in OR ? ?Informed Consent: I have reviewed the patients History and Physical, chart, labs and discussed the procedure including the risks, benefits and alternatives for the proposed anesthesia with the patient or authorized representative who has indicated his/her understanding and acceptance.  ? ? ? ?Dental Advisory  Given ? ?Plan Discussed with: Anesthesiologist, CRNA and Surgeon ? ?Anesthesia Plan Comments:   ? ? ? ? ? ? ?Anesthesia Quick Evaluation ? ?

## 2022-01-05 NOTE — Op Note (Signed)
Operative note ? ? Surgeon:Joshlyn Beadle Ether Griffins ? ?  Assistant:None   ? ?  Preop diagnosis: Osteomyelitis right forefoot with gangrene ? ?  Postop diagnosis: Same ? ?  Procedure:Transmetatarsal amputation right foot ? ?  EBL: 30 mL ? ?  Anesthesia:local and general.  Local consisted of a total of 10 cc of 0.5% bupivacaine and 10 cc of 1% lidocaine plain ? ?  Hemostasis: Ankle tourniquet inflated to 200 mmHg for approximately 15 minutes ? ?  Specimen: Fourth toe for bone culture and forefoot for pathology ? ?  Complications: None ? ?  Operative indications:[name] is an 71 y.o. that presents today for surgical intervention.  The risks/benefits/alternatives/complications have been discussed and consent has been given. ? ?  Procedure:  ?Patient was brought into the OR and placed on the operating table in thesupine position. After anesthesia was obtained theright lower extremity was prepped and draped in usual sterile fashion. ? ?Attention was directed to the forefoot where 2 full-thickness flaps were created proximal to the metatarsophalangeal joint.  Fishmouth flaps were created proximal into the midshaft level.  Osteotomies were created through the first second third and fourth metatarsals.  A previous fifth ray amputation had been performed.  The forefoot was then excised from the surgical field in toto.  All bleeders were Bovie cauterized.  Further recontouring of the bones was performed for primary closure of the skin flap.  The wound was flushed with copious amounts of irrigation.  Closure was then performed with a 2-0 nylon.  A large bulky sterile dressing was applied to the right forefoot. ? ?  Patient tolerated the procedure and anesthesia well.  Was transported from the OR to the PACU with all vital signs stable and vascular status intact. To be discharged per routine protocol.  Will follow up in approximately 1 week in the outpatient clinic. ? ?

## 2022-01-05 NOTE — Transfer of Care (Signed)
Immediate Anesthesia Transfer of Care Note ? ?Patient: [name] ? ?Procedure(s) Performed: TRANSMETATARSAL AMPUTATION (Right: Foot) ? ?Patient Location: PACU ? ?Anesthesia Type:General ? ?Level of Consciousness: drowsy ? ?Airway & Oxygen Therapy: Patient Spontanous Breathing and Patient connected to face mask oxygen ? ?Post-op Assessment: Report given to RN, Post -op Vital signs reviewed and stable and Patient moving all extremities ? ?Post vital signs: Reviewed and stable ? ?Last Vitals:  ?Vitals Value Taken Time  ?BP    ?Temp    ?Pulse 78 01/05/22 1135  ?Resp 14 01/05/22 1135  ?SpO2 99 % 01/05/22 1135  ? ? ?Last Pain:  ?Vitals:  ? 01/05/22 0734  ?TempSrc:   ?PainSc: 0-No pain  ?   ? ?  ? ?Complications: No notable events documented. ?

## 2022-01-05 NOTE — Anesthesia Procedure Notes (Signed)
Procedure Name: LMA Insertion ?Date/Time: 01/05/2022 10:32 AM ?Performed by: Esaw Grandchild, CRNA ?Pre-anesthesia Checklist: Patient identified, Emergency Drugs available, Suction available and Patient being monitored ?Patient Re-evaluated:Patient Re-evaluated prior to induction ?Oxygen Delivery Method: Circle system utilized ?Preoxygenation: Pre-oxygenation with 100% oxygen ?Induction Type: IV induction ?Ventilation: Mask ventilation without difficulty ?LMA: LMA inserted ?LMA Size: 5.0 ?Number of attempts: 1 ?Airway Equipment and Method: Oral airway ?Placement Confirmation: positive ETCO2 and breath sounds checked- equal and bilateral ?Tube secured with: Tape ?Dental Injury: Teeth and Oropharynx as per pre-operative assessment  ? ? ? ? ?

## 2022-01-06 ENCOUNTER — Encounter: Payer: Self-pay | Admitting: Podiatry

## 2022-01-06 DIAGNOSIS — M869 Osteomyelitis, unspecified: Secondary | ICD-10-CM

## 2022-01-06 DIAGNOSIS — N1831 Chronic kidney disease, stage 3a: Secondary | ICD-10-CM | POA: Diagnosis not present

## 2022-01-06 DIAGNOSIS — E1122 Type 2 diabetes mellitus with diabetic chronic kidney disease: Secondary | ICD-10-CM

## 2022-01-06 DIAGNOSIS — E1169 Type 2 diabetes mellitus with other specified complication: Secondary | ICD-10-CM

## 2022-01-06 DIAGNOSIS — E11628 Type 2 diabetes mellitus with other skin complications: Secondary | ICD-10-CM | POA: Diagnosis not present

## 2022-01-06 DIAGNOSIS — L089 Local infection of the skin and subcutaneous tissue, unspecified: Secondary | ICD-10-CM | POA: Diagnosis not present

## 2022-01-06 LAB — GLUCOSE, CAPILLARY
Glucose-Capillary: 182 mg/dL — ABNORMAL HIGH (ref 70–99)
Glucose-Capillary: 184 mg/dL — ABNORMAL HIGH (ref 70–99)
Glucose-Capillary: 197 mg/dL — ABNORMAL HIGH (ref 70–99)
Glucose-Capillary: 149 mg/dL — ABNORMAL HIGH (ref 70–99)

## 2022-01-06 MED ORDER — INSULIN GLARGINE-YFGN 100 UNIT/ML ~~LOC~~ SOLN
15.0000 [IU] | Freq: Every day | SUBCUTANEOUS | Status: DC
  Administered 2022-01-06 – 2022-01-07 (×2): 15 [IU] via SUBCUTANEOUS
  Filled 2022-01-06 (×3): qty 0.15

## 2022-01-06 MED ORDER — SODIUM CHLORIDE 0.9 % IV SOLN
2.0000 g | INTRAVENOUS | Status: DC
  Administered 2022-01-06 – 2022-01-07 (×2): 2 g via INTRAVENOUS
  Filled 2022-01-06 (×2): qty 2
  Filled 2022-01-06: qty 20

## 2022-01-06 MED ORDER — INSULIN GLARGINE-YFGN 100 UNIT/ML ~~LOC~~ SOLN
12.0000 [IU] | Freq: Every day | SUBCUTANEOUS | Status: DC
Start: 2022-01-06 — End: 2022-01-06

## 2022-01-06 NOTE — Progress Notes (Signed)
1 Day Post-Op  ? ?Subjective/Chief Complaint: ?Patient seen.  No complaints ? ? ?Objective: ?Vital signs in last 24 hours: ?Temp:  [97.5 ?F (36.4 ?C)-98.3 ?F (36.8 ?C)] 98 ?F (36.7 ?C) (05/08 1962) ?Pulse Rate:  [73-93] 93 (05/08 0852) ?Resp:  [14-20] 16 (05/08 0852) ?BP: (106-122)/(60-68) 122/60 (05/08 2297) ?SpO2:  [94 %-99 %] 94 % (05/08 0852) ?Last BM Date : 01/03/22 ? ?Intake/Output from previous day: ?05/07 0701 - 05/08 0700 ?In: 540 [P.O.:240; I.V.:300] ?Out: 870 [Urine:840; Blood:30] ?Intake/Output this shift: ?Total I/O ?In: 840 [P.O.:240; IV Piggyback:600] ?Out: 800 [Urine:800] ? ?Moderate to heavy bleeding is noted on the bandaging with some strikethrough.  Upon removal incision is well coapted with some mild purulence from the lateral aspect.  No expressible drainage. ? ? ?Lab Results:  ?Recent Labs  ?  01/04/22 ?0603  ?WBC 5.1  ?HGB 9.7*  ?HCT 29.3*  ?PLT 350  ? ?BMET ?Recent Labs  ?  01/04/22 ?0603  ?NA 134*  ?K 5.1  ?CL 109  ?CO2 19*  ?GLUCOSE 131*  ?BUN 20  ?CREATININE 1.47*  ?CALCIUM 8.8*  ? ?PT/INR ?Recent Labs  ?  01/03/22 ?1609  ?LABPROT 14.5  ?INR 1.1  ? ?ABG ?No results for input(s): PHART, HCO3 in the last 72 hours. ? ?Invalid input(s): PCO2, PO2 ? ?Studies/Results: ?No results found. ? ?Anti-infectives: ?Anti-infectives (From admission, onward)  ? ? Start     Dose/Rate Route Frequency Ordered Stop  ? 01/06/22 1200  cefTRIAXone (ROCEPHIN) 2 g in sodium chloride 0.9 % 100 mL IVPB       ? 2 g ?200 mL/hr over 30 Minutes Intravenous Every 24 hours 01/06/22 1052    ? 01/04/22 0830  ciprofloxacin (CIPRO) IVPB 400 mg  Status:  Discontinued       ? 400 mg ?200 mL/hr over 60 Minutes Intravenous Every 12 hours 01/04/22 0744 01/06/22 1053  ? 01/03/22 1545  vancomycin (VANCOREADY) IVPB 1500 mg/300 mL       ? 1,500 mg ?150 mL/hr over 120 Minutes Intravenous  Once 01/03/22 1537 01/04/22 0701  ? 01/03/22 1536  vancomycin variable dose per unstable renal function (pharmacist dosing)  Status:  Discontinued        ?  Does not apply See admin instructions 01/03/22 1537 01/04/22 0744  ? 01/03/22 1445  metroNIDAZOLE (FLAGYL) IVPB 500 mg       ? 500 mg ?100 mL/hr over 60 Minutes Intravenous Every 12 hours 01/03/22 1439    ? 01/03/22 1315  ciprofloxacin (CIPRO) IVPB 400 mg       ? 400 mg ?200 mL/hr over 60 Minutes Intravenous  Once 01/03/22 1301 01/03/22 1401  ? 01/03/22 1315  vancomycin (VANCOCIN) IVPB 1000 mg/200 mL premix       ? 1,000 mg ?200 mL/hr over 60 Minutes Intravenous  Once 01/03/22 1303 01/03/22 1517  ? 01/03/22 0000  ciprofloxacin (CIPRO) 500 MG tablet       ? 500 mg Oral 2 times daily 01/03/22 1312 01/13/22 2359  ? ?  ? ? ?Assessment/Plan: ?s/p Procedure(s): ?TRANSMETATARSAL AMPUTATION (Right) ?Assessment: Stable status post transmetatarsal amputation right foot. ? ?Plan: Betadine and a bulky sterile bandage reapplied to the right foot.  Keep the bandage clean and dry.  Weightbearing only on the heel walking flat-footed.  Awaiting culture and pathology results.  Patient should be stable for discharge from podiatry standpoint when appropriate. ? LOS: 3 days  ? ? ?Ricci Barker ?01/06/2022 ? ?

## 2022-01-06 NOTE — Consult Note (Signed)
NAME: [name]  ?DOB: June 20, 1951  ?MRN: [id number]  ?Date/Time: 01/06/2022 10:48 AM ? ?REQUESTING PROVIDER: Dr.PAtel ?Subjective:  ?REASON FOR CONSULT: DFI and bacteremia ?? ?[name] is a 71 y.o. with a history of DM , CKD, HTN, hyperlipidemia PVD, h/o rt foot infection- 5th toe amputation ?Admitted with rt foot infection for a few weeks. It has been worsening with discoloration and skin coming off the top of the toes ?In the ED temp 98.2, BP 103/58, Pulse 87, sats 97% ?Wbc 6.3, Hb 11, PLT 430, cr 1.92 ?Blood culture sent and started on vanco cipro and flagyl. Underwent MRI of the rt foot which showed septic arthritis and osteo of 4th PIP joint ?Also bone marrow edema 2nd, third concerning for oste- seen by podiatrist and underwent TMA on 01/05/22 ?I am seeing the patient as the blood culture is positive for anerobic gram positive cocci ?Pt has no hardware in his body ?No fever ? ? ? ?Past Medical History:  ?Diagnosis Date  ? Arthritis   ? Bladder incontinence   ? Chronic kidney disease   ? Stage 3 Kidney disease  ? Dementia ([place])   ? Diabetes mellitus without complication ([place])   ? DVT of leg (deep venous thrombosis) ([place])   ? H/O RIGHT LEG 2018  ? Dyspnea   ? occassional  ? Hyperlipidemia   ? Hypertension   ? Peripheral vascular disease ([place])   ? Sepsis ([place])   ?  ?Past Surgical History:  ?Procedure Laterality Date  ? ABDOMINAL AORTOGRAM W/LOWER EXTREMITY N/A 10/10/2016  ? Procedure: Abdominal Aortogram w/Lower Extremity;  Surgeon: Katha Cabal, MD;  Location: [place] [facility];  Service: Cardiovascular;  Laterality: N/A;  ? CATARACT EXTRACTION W/PHACO Left 05/27/2018  ? Procedure: CATARACT EXTRACTION PHACO AND INTRAOCULAR LENS PLACEMENT (IOC);  Surgeon: Eulogio Bear, MD;  Location: [facility];  Service: Ophthalmology;  Laterality: Left;  [place] 03:06.2 ?AP% 16.1 ?CDE 30.16 ?FLUID PACK LOT @ [id number]  ? CIRCUMCISION  1998  ? EYE SURGERY Left   ? Retina Detachment  ? GRAFT APPLICATION Right  [id number]  ? Procedure: GRAFT APPLICATION ( RIGHT FOOT );  Surgeon: Katha Cabal, MD;  Location: [facility];  Service: Vascular;  Laterality: Right;  graft taken from patients right thigh  ? IRRIGATION AND DEBRIDEMENT FOOT Right 10/03/2016  ? Procedure: IRRIGATION AND DEBRIDEMENT FOOT;  Surgeon: Samara Deist, DPM;  Location: [facility];  Service: Podiatry;  Laterality: Right;  ? LOWER EXTREMITY INTERVENTION  10/10/2016  ? Procedure: Lower Extremity Intervention;  Surgeon: Katha Cabal, MD;  Location: [place] [facility];  Service: Cardiovascular;;  ? PERIPHERAL VASCULAR BALLOON ANGIOPLASTY Left 10/10/2016  ? Procedure: Peripheral Vascular Balloon Angioplasty;  Surgeon: Katha Cabal, MD;  Location: [place] [facility];  Service: Cardiovascular;  Laterality: Left;  ? TONSILLECTOMY    ? WOUND DEBRIDEMENT Right 11/07/2016  ? Procedure: DEBRIDEMENT OF WOUND AND BONE RIGHT FOOT AND APPLY WOUND VAC;  Surgeon: Samara Deist, DPM;  Location: [facility];  Service: Podiatry;  Laterality: Right;  ?  ?Social History  ? ?Socioeconomic History  ? Marital status: Single  ?  Spouse name: Not on file  ? Number of children: Not on file  ? Years of education: Not on file  ? Highest education level: Not on file  ?Occupational History  ? Not on file  ?Tobacco Use  ? Smoking status: Former  ?  Types: Pipe  ?  Quit date: 05/23/1975  ?  Years  since quitting: 46.6  ? Smokeless tobacco: Never  ?Vaping Use  ? Vaping Use: Never used  ?Substance and Sexual Activity  ? Alcohol use: No  ? Drug use: No  ? Sexual activity: Not on file  ?Other Topics Concern  ? Not on file  ?Social History Narrative  ? Not on file  ? ?Social Determinants of Health  ? ?Financial Resource Strain: Not on file  ?Food Insecurity: Not on file  ?Transportation Needs: Not on file  ?Physical Activity: Not on file  ?Stress: Not on file  ?Social Connections: Not on file  ?Intimate Partner Violence: Not on file  ?  ?Family History  ?Problem Relation Age of Onset  ? Diabetes  Mother   ? Hypertension Mother   ? Diabetes Father   ? Hypertension Father   ? ?Allergies  ?Allergen Reactions  ? Sulfa Antibiotics Rash  ? Zosyn [Piperacillin Sod-Tazobactam So] Rash  ? ?I? ?Current Facility-Administered Medications  ?Medication Dose Route Frequency Provider Last Rate Last Admin  ? acetaminophen (TYLENOL) tablet 650 mg  650 mg Oral Q6H PRN Samara Deist, DPM   650 mg at 01/06/22 0119  ? albuterol (PROVENTIL) (2.5 MG/3ML) 0.083% nebulizer solution 3 mL  3 mL Inhalation Q4H PRN Samara Deist, DPM      ? atorvastatin (LIPITOR) tablet 10 mg  10 mg Oral Daily Samara Deist, DPM   10 mg at 01/06/22 [id number]  ? ciprofloxacin (CIPRO) IVPB 400 mg  400 mg Intravenous Q12H Samara Deist, DPM 200 mL/hr at 01/06/22 0835 400 mg at 01/06/22 0835  ? dextromethorphan-guaiFENesin (MUCINEX DM) 30-600 MG per 12 hr tablet 1 tablet  1 tablet Oral BID PRN Samara Deist, DPM      ? docusate sodium (COLACE) capsule 100 mg  100 mg Oral BID PRN Samara Deist, DPM      ? ferrous sulfate tablet 325 mg  325 mg Oral BID WC Samara Deist, DPM   325 mg at 01/06/22 [id number]  ? heparin injection 5,000 Units  5,000 Units Subcutaneous Q8H Samara Deist, DPM   5,000 Units at 01/06/22 [id number]  ? hydrALAZINE (APRESOLINE) injection 5 mg  5 mg Intravenous Q2H PRN Samara Deist, DPM      ? insulin aspart (novoLOG) injection 0-5 Units  0-5 Units Subcutaneous QHS Samara Deist, DPM   2 Units at 01/05/22 2227  ? insulin aspart (novoLOG) injection 0-9 Units  0-9 Units Subcutaneous TID WC Samara Deist, DPM   2 Units at 01/06/22 [phone]  ? insulin glargine-yfgn (SEMGLEE) injection 10 Units  10 Units Subcutaneous QHS Samara Deist, DPM   10 Units at 01/05/22 2227  ? metroNIDAZOLE (FLAGYL) IVPB 500 mg  500 mg Intravenous Q12H Samara Deist, DPM 100 mL/hr at 01/06/22 0158 500 mg at 01/06/22 0158  ? multivitamin with minerals tablet 1 tablet  1 tablet Oral Daily Samara Deist, DPM   1 tablet at 01/06/22 [id number]  ? ondansetron (ZOFRAN) injection 4 mg  4  mg Intravenous Q8H PRN Samara Deist, DPM   4 mg at 01/05/22 1331  ? simethicone (MYLICON) chewable tablet 80 mg  80 mg Oral QID PRN Samara Deist, DPM      ?  ? ?Abtx:  ?Anti-infectives (From admission, onward)  ? ? Start     Dose/Rate Route Frequency Ordered Stop  ? 01/04/22 0830  ciprofloxacin (CIPRO) IVPB 400 mg       ? 400 mg ?200 mL/hr over 60 Minutes Intravenous Every 12 hours 01/04/22 0744    ? 01/03/22 1545  vancomycin (VANCOREADY) IVPB 1500 mg/300 mL       ? 1,500 mg ?150 mL/hr over 120 Minutes Intravenous  Once 01/03/22 1537 01/04/22 0701  ? 01/03/22 1536  vancomycin variable dose per unstable renal function (pharmacist dosing)  Status:  Discontinued       ?  Does not apply See admin instructions 01/03/22 1537 01/04/22 0744  ? 01/03/22 1445  metroNIDAZOLE (FLAGYL) IVPB 500 mg       ? 500 mg ?100 mL/hr over 60 Minutes Intravenous Every 12 hours 01/03/22 1439    ? 01/03/22 1315  ciprofloxacin (CIPRO) IVPB 400 mg       ? 400 mg ?200 mL/hr over 60 Minutes Intravenous  Once 01/03/22 1301 01/03/22 1401  ? 01/03/22 1315  vancomycin (VANCOCIN) IVPB 1000 mg/200 mL premix       ? 1,000 mg ?200 mL/hr over 60 Minutes Intravenous  Once 01/03/22 1303 01/03/22 1517  ? 01/03/22 0000  ciprofloxacin (CIPRO) 500 MG tablet       ? 500 mg Oral 2 times daily 01/03/22 1312 01/13/22 2359  ? ?  ? ? ?REVIEW OF SYSTEMS:  ?Const: negative fever, negative chills, negative weight loss ?Eyes: negative diplopia or visual changes, negative eye pain ?ENT: negative coryza, negative sore throat ?Resp: negative cough, hemoptysis, dyspnea ?Cards: negative for chest pain, palpitations, lower extremity edema ?GU: negative for frequency, dysuria and hematuria ?GI: Negative for abdominal pain, diarrhea, bleeding, constipation ?Skin: negative for rash and pruritus ?Heme: negative for easy bruising and gum/nose bleeding ?MS: pain rt foot ?Neurolo:negative for headaches, dizziness, vertigo, memory problems  ?Psych: negative for feelings of  anxiety, depression  ?Endocrine: negative for thyroid, diabetes ?Allergy/Immunology-zosyn ? ?Objective:  ?VITALS:  ?BP 122/60 (BP Location: Left Arm)   Pulse 93   Temp 98 ?F (36.7 ?C)   Resp 16   Ht 5\' 11"  (1.80

## 2022-01-06 NOTE — Evaluation (Signed)
Physical Therapy Evaluation ?Patient Details ?Name: [name] ?MRN: [id number] ?DOB: 06/23/1951 ?Today's Date: 01/06/2022 ? ?History of Present Illness ? [name] is a 71 y.o. male with medical history significant of PVD, diabetes mellitus, hypertension, hyperlipidemia, right leg DVT off Xarelto, CKD-3A, lymphedema, obesity with BMI 39.05, iron deficiency anemia, who presents with right foot infection. 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 ?  ?Clinical Impression ? Pt admitted with above diagnosis. Pt received upright in bed agreeable to PT services. Reports he is normally indep with mobility in household and mod-I in community with SPC or RW. Pt educated he is NWB on his RLE per podiatry with pt verbalizing understanding. Pt is mod-I with bed  mobility to sit EoB but HOB is elevated and used bed rails. Pt overall resting RLE on floor despite education and cuing for maintaining NWB precautions in sitting requiring frequent reminders. With bed elevated, pt required 2 attempts to STS with max VC's for hand placement to push up from bed versus pulling on walker. On second attempt and minA pt stood to RW with significant L lateral lean to maintain NWB precautions and heavy use of UE's on RW for support.  Pt returned to sitting with PT setting up recliner for stand pivot transfer. Once recliner is in place, pt standing impulsively with improved heand placement and neutral posture in standing but does demonstrate significant frontal plane unsteadiness and heavy use of UE's and minA on RW for turning and pivoting in recliner with poor eccentric control and poor hand placement but maintaining NWB on RLE throughout. All needs in reach with LE elevated. Pt remains high risk for falls and difficulty maintaining NWB precautions. Unable to safely progress hop to gait or stair training at this time. Will benefit STR at d/c to improve independence/safety in mobility with LRAD to reduce risk of falls. Pt  currently with functional limitations due to the deficits listed below (see PT Problem List). Pt will benefit from skilled PT to increase their independence and safety with mobility to allow discharge to the venue listed below.     ?   ? ?Recommendations for follow up therapy are one component of a multi-disciplinary discharge planning process, led by the attending physician.  Recommendations may be updated based on patient status, additional functional criteria and insurance authorization. ? ?Follow Up Recommendations Skilled nursing-short term rehab (<3 hours/day) ? ?  ?Assistance Recommended at Discharge Intermittent Supervision/Assistance  ?Patient can return home with the following ? A lot of help with walking and/or transfers;A lot of help with bathing/dressing/bathroom;Assist for transportation;Help with stairs or ramp for entrance;Assistance with cooking/housework ? ?  ?Equipment Recommendations Other (comment) (tbd by next venue of care)  ?Recommendations for Other Services ?    ?  ?Functional Status Assessment Patient has had a recent decline in their functional status and demonstrates the ability to make significant improvements in function in a reasonable and predictable amount of time.  ? ?  ?Precautions / Restrictions Precautions ?Precautions: Fall ?Restrictions ?Weight Bearing Restrictions: Yes ?RLE Weight Bearing: Non weight bearing  ? ?  ? ?Mobility ? Bed Mobility ?Overal bed mobility: Modified Independent ?  ?  ?  ?  ?  ?  ?General bed mobility comments: use of bed features ?Patient Response: Cooperative ? ?Transfers ?Overall transfer level: Needs assistance ?Equipment used: Rolling walker (2 wheels) ?Transfers: Bed to chair/wheelchair/BSC, Sit to/from Stand ?Sit to Stand: Min assist, From elevated surface (L truncal lean onto LLE.) ?Stand pivot transfers:  Min assist, From elevated surface ?  ?  ?  ?  ?General transfer comment: pt impulsive and quick to stand and begin pivoting with significant  frontal plane unsteadiness. ?  ? ?Ambulation/Gait ?  ?  ?  ?  ?  ?  ?  ?General Gait Details: deferred due to difficulty standing and transferring ? ?Stairs ?  ?  ?  ?  ?  ? ?Wheelchair Mobility ?  ? ?Modified Rankin (Stroke Patients Only) ?  ? ?  ? ?Balance Overall balance assessment: Needs assistance ?Sitting-balance support: No upper extremity supported ?Sitting balance-Leahy Scale: Fair ?  ?Postural control: Left lateral lean ?Standing balance support: Bilateral upper extremity supported, Reliant on assistive device for balance ?Standing balance-Leahy Scale: Poor ?Standing balance comment: Heavy reliance on RW for support with L lateral lean onto L side of body to maintain NWB on RLE ?  ?  ?  ?  ?  ?  ?  ?  ?  ?  ?  ?   ? ? ? ?Pertinent Vitals/Pain Pain Assessment ?Pain Assessment: No/denies pain  ? ? ?Home Living Family/patient expects to be discharged to:: Private residence ?Living Arrangements: Other relatives (sister) ?Available Help at Discharge: Family ?Type of Home: House ?Home Access: Stairs to enter ?Entrance Stairs-Rails: Can reach both ?Entrance Stairs-Number of Steps: 3-4 ?Alternate Level Stairs-Number of Steps: has basement but does not access it ?Home Layout: Two level;Able to live on main level with bedroom/bathroom ?Home Equipment: Agricultural consultantolling Walker (2 wheels);Cane - single point ?   ?  ?Prior Function Prior Level of Function : Independent/Modified Independent ?  ?  ?  ?  ?  ?  ?Mobility Comments: Use of Rw/SPC in community. No device in household ?  ?  ? ? ?Hand Dominance  ?   ? ?  ?Extremity/Trunk Assessment  ? Upper Extremity Assessment ?Upper Extremity Assessment: Generalized weakness ?  ? ?Lower Extremity Assessment ?Lower Extremity Assessment: RLE deficits/detail ?RLE Deficits / Details: NWB on R foot s/p R transmet amputation ?  ? ?Cervical / Trunk Assessment ?Cervical / Trunk Assessment: Normal  ?Communication  ? Communication: No difficulties  ?Cognition Arousal/Alertness:  Awake/alert ?Behavior During Therapy: [facility] for tasks assessed/performed ?Overall Cognitive Status: Within Functional Limits for tasks assessed ?  ?  ?  ?  ?  ?  ?  ?  ?  ?  ?  ?  ?  ?  ?  ?  ?  ?  ?  ? ?  ?General Comments   ? ?  ?Exercises General Exercises - Lower Extremity ?Ankle Circles/Pumps: AROM, Strengthening, Both, 10 reps, Supine ?Other Exercises ?Other Exercises: Role of PT in acute setting, d/c recs, WB precautions on RLE, safe use of hands to RW and stand pivot transfer technique  ? ?Assessment/Plan  ?  ?PT Assessment Patient needs continued PT services  ?PT Problem List Decreased strength;Decreased mobility;Decreased safety awareness;Decreased range of motion;Decreased activity tolerance;Decreased balance ? ?   ?  ?PT Treatment Interventions DME instruction;Therapeutic exercise;Gait training;Balance training;Stair training;Neuromuscular re-education;Functional mobility training;Therapeutic activities;Patient/family education   ? ?PT Goals (Current goals can be found in the Care Plan section)  ?Acute Rehab PT Goals ?Patient Stated Goal: to go home ?PT Goal Formulation: With patient ?Time For Goal Achievement: 01/20/22 ?Potential to Achieve Goals: Fair ? ?  ?Frequency 7X/week ?  ? ? ?Co-evaluation   ?  ?  ?  ?  ? ? ?  ?AM-PAC PT "6 Clicks" Mobility  ?Outcome Measure Help needed  turning from your back to your side while in a flat bed without using bedrails?: A Little ?Help needed moving from lying on your back to sitting on the side of a flat bed without using bedrails?: A Little ?Help needed moving to and from a bed to a chair (including a wheelchair)?: A Lot ?Help needed standing up from a chair using your arms (e.g., wheelchair or bedside chair)?: A Lot ?Help needed to walk in hospital room?: Total ?Help needed climbing 3-5 steps with a railing? : Total ?6 Click Score: 12 ? ?  ?End of Session Equipment Utilized During Treatment: Gait belt ?Activity Tolerance: Patient tolerated treatment well ?Patient  left: in chair;with call bell/phone within reach ?Nurse Communication: Mobility status ?PT Visit Diagnosis: Unsteadiness on feet (R26.81);Other abnormalities of gait and mobility (R26.89);Muscle weakness (generalized) (M62.8

## 2022-01-06 NOTE — Anesthesia Postprocedure Evaluation (Signed)
Anesthesia Post Note ? ?Patient: [name] ? ?Procedure(s) Performed: TRANSMETATARSAL AMPUTATION (Right: Foot) ? ?Patient location during evaluation: PACU ?Anesthesia Type: General ?Level of consciousness: awake and alert ?Pain management: pain level controlled ?Vital Signs Assessment: post-procedure vital signs reviewed and stable ?Respiratory status: spontaneous breathing, nonlabored ventilation, respiratory function stable and patient connected to nasal cannula oxygen ?Cardiovascular status: blood pressure returned to baseline and stable ?Postop Assessment: no apparent nausea or vomiting ?Anesthetic complications: no ? ? ?No notable events documented. ? ? ?Last Vitals:  ?Vitals:  ? 01/05/22 1942 01/06/22 0427  ?BP: 106/62 116/68  ?Pulse: 86 81  ?Resp: 18 20  ?Temp: 36.8 ?C 36.8 ?C  ?SpO2: 99% 96%  ?  ?Last Pain:  ?Vitals:  ? 01/06/22 0748  ?TempSrc:   ?PainSc: Asleep  ? ? ?  ?  ?  ?  ?  ?  ? ?Lenard Simmer ? ? ? ? ?

## 2022-01-06 NOTE — Care Management Important Message (Signed)
Important Message ? ?Patient Details  ?Name: [name] ?MRN: [id number] ?Date of Birth: [birth year] ? ? ?Medicare Important Message Given:  N/A - LOS <3 / Initial given by admissions ? ? ? ? ?Olegario Messier A Unika Nazareno ?01/06/2022, 10:31 AM ?

## 2022-01-06 NOTE — Progress Notes (Signed)
[place] at [facility] ? ? ?PATIENT NAME: [name]   ? ?MR#:  [id number] ? ?DATE OF BIRTH:  31-Jul-1951 ? ?SUBJECTIVE:  ?came in with complains of worsening wound to his right foot. He has been followed to the wound care center for some time. Has history of lymphedema and diabetes. ?MRI positive for osteomyelitis ? ? POD Right trans metatarsal amputation ? ? ?VITALS:  ?Blood pressure 122/60, pulse 93, temperature 98 ?F (36.7 ?C), resp. rate 16, height 5\' 11"  (1.803 m), weight 127 kg, SpO2 94 %. ? ?PHYSICAL EXAMINATION:  ? ?GENERAL:  71 y.o.-year-old patient lying in the bed with no acute distress.  ?LUNGS: Normal breath sounds bilaterally ?CARDIOVASCULAR: S1, S2 normal. No murmurs  ?ABDOMEN: Soft, nontender, nondistended. ?Right foot :  ? ?    ?NEUROLOGIC: nonfocal  patient is alert and awake ? ?LABORATORY PANEL:  ?CBC ?Recent Labs  ?Lab 01/04/22 ?0603  ?WBC 5.1  ?HGB 9.7*  ?HCT 29.3*  ?PLT 350  ? ? ? ?Chemistries  ?Recent Labs  ?Lab 01/03/22 ?1158 01/04/22 ?0603  ?NA 136 134*  ?K 5.2* 5.1  ?CL 106 109  ?CO2 18* 19*  ?GLUCOSE 133* 131*  ?BUN 29* 20  ?CREATININE 1.92* 1.47*  ?CALCIUM 9.1 8.8*  ?AST 19  --   ?ALT 12  --   ?ALKPHOS 74  --   ?BILITOT 0.5  --   ? ? ?Cardiac Enzymes ?No results for input(s): TROPONINI in the last 168 hours. ?RADIOLOGY:  ?No results found. ? ?Assessment and Plan ?[name] is a 71 y.o. male with medical history significant of PVD, diabetes mellitus, hypertension, hyperlipidemia, right leg DVT off Xarelto, CKD-3A, lymphedema, obesity with BMI 39.05, iron deficiency anemia, who presents with right foot infection. ? ?Diabetic right foot infection with osteomyelitis: Patient has elevated lactic acid 2.8, but does not meets critical for sepsis.  ?-- Consulted Dr. Lucky Cowboy of vascular surgery and Dr. Vickki Muff of podiatry. ? ?- Empiric antimicrobial treatment with Cipro and Flagyl ?- PRN Zofran for nausea ?- Blood cultures gram-positive cocci, BCID negative ?-  MRI-right foot 1. Septic arthritis and osteomyelitis of the fourth PIP joint. ?2. Severe bone marrow edema in the fourth distal phalanx most ?concerning for osteomyelitis. ?3. Mild bone marrow edema in the second and third middle and distal ?phalanx concerning for osteomyelitis. ?4. Prior fifth transmetatarsal amputation. Mild bone marrow edema in ?the fifth metatarsal stump concerning for residual osteomyelitis ? ?-- Dr. Vickki Muff plants trans metatarsal amputation 01/05/2022 ?-- PT initiated after discussion with podiatry. Not weight bearing status. ?-- TOC for discharge planning to rehab ?-- ID consultation for antibiotics-- IV Rocephin + IV Flagyl ?  ?Hyperkalemia ?Potassium 5.2 ?-received Lokelma ?  ?Iron deficiency anemia ?- Hemoglobin stable, 11.0 ?-Continue iron supplement ?  ?Acute renal failure superimposed on stage 3a chronic kidney disease ([place]) ?--Baseline creatinine 0.91 on 05/22/2017.  No recent baseline creatinine available.  His creatinine is 1.92, BUN 29. ?-Hold Prinzide ?-IV fluid as above ?  ?Type II diabetes mellitus with renal manifestations ([place]) ?Recent A1c 8.7, well controlled.  Patient is taking Humalog, metformin and Lantus to 15 units daily ?-Sliding scale insulin ?-Glargine insulin 10 units daily ?  ?HTN (hypertension) ?- Hold lisinopril-HCTZ due to worsening renal function ?-IV hydralazine as needed ?  ?Peripheral vascular disease ([place]) ?- Consulted vascular surgery--will consider LE angiogram depending on blood flow noted during amputation ?  ?Hyperlipidemia ?- Lipitor ?  ?  ? ?Procedures: right foot trans  metatarsal amputation ?Family communication none ?Consults : ID, vascular surgery and podiatry ?CODE STATUS: full ?DVT Prophylaxis : heparin ?Level of care: Med-Surg ?Status is: Inpatient ?Remains inpatient appropriate because: right foot surgery. PT consulted. TOC for discharge planning. ? ? ?TOTAL TIME TAKING CARE OF THIS PATIENT: 35 minutes.  ?>50% time spent on counselling and  coordination of care ? ?Note: This dictation was prepared with Dragon dictation along with smaller phrase technology. Any transcriptional errors that result from this process are unintentional. ? ?Fritzi Mandes M.D  ? ? ?[facility]  ? ?CC: ?Primary care physician; Frazier Richards, MD  ?

## 2022-01-06 NOTE — NC FL2 (Signed)
?[place] MEDICAID FL2 LEVEL OF CARE SCREENING TOOL  ?  ? ?IDENTIFICATION  ?Patient Name: ?[name] Birthdate: 02/10/1951 Sex: male Admission Date (Current Location): ?01/03/2022  ?[place] and [place] Number: ? [place] ?  Facility and Address:  ?[facility], [address], [place], [place] [address] ?     Provider Number: ?[id number]  ?Attending Physician Name and Address:  ?Fritzi Mandes, MD ? Relative Name and Phone Number:  ?faye [phone] ?   ?Current Level of Care: ?Hospital Recommended Level of Care: ?[place] Prior Approval Number: ?  ? ?Date Approved/Denied: ?  PASRR Number: ?[id number] ? ?Discharge Plan: ?SNF ?  ? ?Current Diagnoses: ?Patient Active Problem List  ? Diagnosis Date Noted  ? Diabetic foot infection ([place]) 01/03/2022  ? Hyperkalemia 01/03/2022  ? Peripheral vascular disease ([place])   ? HTN (hypertension)   ? Type II diabetes mellitus with renal manifestations ([place])   ? Acute renal failure superimposed on stage 3a chronic kidney disease ([place])   ? Iron deficiency anemia   ? 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 Lymphedema 01/11/2018  ? Chronic venous insufficiency 01/11/2018  ? Venous ulcer ([place]) 09/28/2017  ? Atherosclerosis of native arteries of extremity with intermittent claudication ([place]) 10/30/2016  ? Ulcer of foot with necrosis of bone ([place]) 10/30/2016  ? Diabetes ([place]) 10/30/2016  ? Hyperlipidemia 10/30/2016  ? Chronic kidney disease (CKD) stage G1/A3, glomerular filtration rate (GFR) equal to or greater than 90 mL/min/1.73 square meter and albuminuria creatinine ratio greater than 300 mg/g 10/27/2016  ? Pressure injury of skin 10/04/2016  ? Septic shock ([place]) 10/03/2016  ? Male erectile dysfunction 07/20/2014  ? Obesity 12/18/2010  ? Hypertension 02/19/2007  ? ? ?Orientation RESPIRATION BLADDER Height & Weight   ?  ?Self, Time, Situation, Place ? Normal Continent Weight: 280 lb (127 kg) ?Height:  5\' 11"  (180.3 cm)   ?BEHAVIORAL SYMPTOMS/MOOD NEUROLOGICAL BOWEL NUTRITION STATUS  ?    Continent Diet (see discharge summary)  ?AMBULATORY STATUS COMMUNICATION OF NEEDS Skin   ?Limited Assist Verbally Other (Comment) (right foot closed incision, diabetic ulcer) ?  ?  ?  ?    ?     ?     ? ? ?Personal Care Assistance Level of Assistance  ?Bathing, Feeding, Dressing, Total care Bathing Assistance: Limited assistance ?Feeding assistance: Independent ?Dressing Assistance: Limited assistance ?Total Care Assistance: Limited assistance  ? ?Functional Limitations Info  ?Hearing, Speech, Sight Sight Info: Adequate ?Hearing Info: Adequate ?Speech Info: Adequate  ? ? ?SPECIAL CARE FACTORS FREQUENCY  ?PT (By licensed PT), OT (By licensed OT)   ?  ?PT Frequency: min 4x weekly ?OT Frequency: min 4x weekly ?  ?  ?  ?   ? ? ?Contractures Contractures Info: Not present  ? ? ?Additional Factors Info  ?Code Status, Allergies Code Status Info: full ?Allergies Info: sulfa antibiotics, zosyn (piperacillin sod-tazobactam so) ?  ?  ?  ?   ? ?Current Medications (01/06/2022):  This is the current hospital active medication list ?Current Facility-Administered Medications  ?Medication Dose Route Frequency Provider Last Rate Last Admin  ? acetaminophen (TYLENOL) tablet 650 mg  650 mg Oral Q6H PRN Samara Deist, DPM   650 mg at 01/06/22 0119  ? albuterol (PROVENTIL) (2.5 MG/3ML) 0.083% nebulizer solution 3 mL  3 mL Inhalation Q4H PRN Samara Deist, DPM      ? atorvastatin (LIPITOR) tablet 10 mg  10 mg Oral Daily Samara Deist, DPM  10 mg at 01/06/22 [id number]  ? cefTRIAXone (ROCEPHIN) 2 g in sodium chloride 0.9 % 100 mL IVPB  2 g Intravenous Q24H Fritzi Mandes, MD 200 mL/hr at 01/06/22 1418 2 g at 01/06/22 1418  ? dextromethorphan-guaiFENesin (MUCINEX DM) 30-600 MG per 12 hr tablet 1 tablet  1 tablet Oral BID PRN Samara Deist, DPM      ? docusate sodium (COLACE) capsule 100 mg  100 mg Oral BID PRN Samara Deist, DPM      ? ferrous sulfate tablet 325 mg  325 mg  Oral BID WC Samara Deist, DPM   325 mg at 01/06/22 [id number]  ? heparin injection 5,000 Units  5,000 Units Subcutaneous Q8H Samara Deist, DPM   5,000 Units at 01/06/22 1418  ? hydrALAZINE (APRESOLINE) injection 5 mg  5 mg Intravenous Q2H PRN Samara Deist, DPM      ? insulin aspart (novoLOG) injection 0-5 Units  0-5 Units Subcutaneous QHS Samara Deist, DPM   2 Units at 01/05/22 2227  ? insulin aspart (novoLOG) injection 0-9 Units  0-9 Units Subcutaneous TID WC Samara Deist, DPM   2 Units at 01/06/22 1153  ? insulin glargine-yfgn (SEMGLEE) injection 15 Units  15 Units Subcutaneous QHS Fritzi Mandes, MD      ? metroNIDAZOLE (FLAGYL) IVPB 500 mg  500 mg Intravenous Q12H Samara Deist, DPM 100 mL/hr at 01/06/22 1521 500 mg at 01/06/22 1521  ? multivitamin with minerals tablet 1 tablet  1 tablet Oral Daily Samara Deist, DPM   1 tablet at 01/06/22 [id number]  ? ondansetron (ZOFRAN) injection 4 mg  4 mg Intravenous Q8H PRN Samara Deist, DPM   4 mg at 01/05/22 1331  ? simethicone (MYLICON) chewable tablet 80 mg  80 mg Oral QID PRN Samara Deist, DPM      ? ? ? ?Discharge Medications: ?Please see discharge summary for a list of discharge medications. ? ?Relevant Imaging Results: ? ?Relevant Lab Results: ? ? ?Additional Information ?[id number] ? ?Alberteen Sam, LCSW ? ? ? ? ?

## 2022-01-07 ENCOUNTER — Inpatient Hospital Stay: Payer: Self-pay

## 2022-01-07 DIAGNOSIS — R7881 Bacteremia: Secondary | ICD-10-CM | POA: Diagnosis not present

## 2022-01-07 DIAGNOSIS — E1169 Type 2 diabetes mellitus with other specified complication: Secondary | ICD-10-CM | POA: Diagnosis not present

## 2022-01-07 DIAGNOSIS — M86171 Other acute osteomyelitis, right ankle and foot: Secondary | ICD-10-CM

## 2022-01-07 DIAGNOSIS — E11628 Type 2 diabetes mellitus with other skin complications: Secondary | ICD-10-CM | POA: Diagnosis not present

## 2022-01-07 DIAGNOSIS — L089 Local infection of the skin and subcutaneous tissue, unspecified: Secondary | ICD-10-CM | POA: Diagnosis not present

## 2022-01-07 LAB — CREATININE, SERUM
Creatinine, Ser: 1.13 mg/dL (ref 0.61–1.24)
GFR, Estimated: >60 mL/min (ref >60)

## 2022-01-07 LAB — GLUCOSE, CAPILLARY
Glucose-Capillary: 148 mg/dL — ABNORMAL HIGH (ref 70–99)
Glucose-Capillary: 295 mg/dL — ABNORMAL HIGH (ref 70–99)
Glucose-Capillary: 213 mg/dL — ABNORMAL HIGH (ref 70–99)
Glucose-Capillary: 189 mg/dL — ABNORMAL HIGH (ref 70–99)

## 2022-01-07 MED ORDER — DOCUSATE SODIUM 100 MG PO CAPS
100.0000 mg | ORAL_CAPSULE | Freq: Two times a day (BID) | ORAL | Status: DC
  Administered 2022-01-07 – 2022-01-08 (×3): 100 mg via ORAL
  Filled 2022-01-07 (×3): qty 1

## 2022-01-07 MED ORDER — POLYETHYLENE GLYCOL 3350 17 G PO PACK
17.0000 g | PACK | Freq: Two times a day (BID) | ORAL | Status: DC
  Administered 2022-01-07 – 2022-01-08 (×3): 17 g via ORAL
  Filled 2022-01-07 (×3): qty 1

## 2022-01-07 MED ORDER — METRONIDAZOLE 500 MG PO TABS
500.0000 mg | ORAL_TABLET | Freq: Two times a day (BID) | ORAL | Status: DC
  Administered 2022-01-07 – 2022-01-08 (×2): 500 mg via ORAL
  Filled 2022-01-07 (×2): qty 1

## 2022-01-07 NOTE — Progress Notes (Signed)
PHARMACIST - PHYSICIAN COMMUNICATION ? ?CONCERNING: Antibiotic IV to Oral Route Change Policy ? ?RECOMMENDATION: ?This patient is receiving metronidazole by the intravenous route.  Based on criteria approved by the Pharmacy and Therapeutics Committee, the antibiotic(s) is/are being converted to the equivalent oral dose form(s). ? ? ?DESCRIPTION: ?These criteria include: ?Patient being treated for a respiratory tract infection, urinary tract infection, cellulitis or clostridium difficile associated diarrhea if on metronidazole ?The patient is not neutropenic and does not exhibit a GI malabsorption state ?The patient is eating (either orally or via tube) and/or has been taking other orally administered medications for a least 24 hours ?The patient is improving clinically and has a Tmax < 100.5 ? ?If you have questions about this conversion, please contact the Pharmacy Department  ? ?[name]  ?01/07/22  ?

## 2022-01-07 NOTE — TOC Progression Note (Signed)
Transition of Care (TOC) - Progression Note  ? ? ?Patient Details  ?Name: [name] ?MRN: [id number] ?Date of Birth: 06-18-1951 ? ?Transition of Care (TOC) CM/SW Contact  ?Conception Oms, RN ?Phone Number: ?01/07/2022, 10:30 AM ? ?Clinical Narrative:    ? ?Met with the patient to review bed options and star review, Patient chose [facility], I notified Tam[facility] at [facility], Ins auth started and is pending, ref number [id number] ? ?  ?  ? ?Expected Discharge Plan and Services ?  ?  ?  ?  ?  ?                ?  ?  ?  ?  ?  ?  ?  ?  ?  ?  ? ? ?Social Determinants of Health (SDOH) Interventions ?  ? ?Readmission Risk Interventions ?   ? View : No data to display.  ?  ?  ?  ? ? ?

## 2022-01-07 NOTE — Progress Notes (Signed)
Physical Therapy Treatment ?Patient Details ?Name: [name] ?MRN: [id number] ?DOB: June 01, 1951 ?Today's Date: 01/07/2022 ? ? ?History of Present Illness [name] is a 72 y.o. male with medical history significant of PVD, diabetes mellitus, hypertension, hyperlipidemia, right leg DVT off Xarelto, CKD-3A, lymphedema, obesity with BMI 39.05, iron deficiency anemia, who presents with right foot infection. 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 ? ?  ?PT Comments  ? ? Pt received upright in recliner agreeable to PT services. Pt remains impulsive and quick to move prior to PT being ready to provide assist but pt is redirectable easily. Attempt to stand to RW with supervision but placing weight on RLE and with post LOB back into recliner. Pt educated pt is NWB on RLE even with post op shoe per orders. Pt unaware even with post op shoe donned stating he has been putting weight on his RLE with post op shoe when transferring. Pt educated on STS technique and is able to stand maintaining RLE precautions with minguard. Pt able to perform 3x3' bouts of hop to gait with RW with very heavy UE reliance on RW with frontal plane unsteadiness and with chair follow.  Pt quick to progress RW prior to hopping thus pt educated on sequencing of RW with hop to gait prior to last bout. Pt able to improve on last bout with better stability with no frontal plane sway but remains weak in UE's to progress past 3' bouts. Noted max HR up to 127 BPM  and increased WOB but improves with seated rest. Pt returned to seated in recliner in room with all needs in reach tolerating LE therex expressing fatigue. STR remains appropriate as pt has difficulty with NWB precautions and remains at high risk of falls despite use of RW and limited tolerance for gait to safely perform stairs to safely return home at this time. ?  ?Recommendations for follow up therapy are one component of a multi-disciplinary discharge planning process, led by  the attending physician.  Recommendations may be updated based on patient status, additional functional criteria and insurance authorization. ? ?Follow Up Recommendations ? Skilled nursing-short term rehab (<3 hours/day) ?  ?  ?Assistance Recommended at Discharge Intermittent Supervision/Assistance  ?Patient can return home with the following A lot of help with walking and/or transfers;A lot of help with bathing/dressing/bathroom;Assist for transportation;Help with stairs or ramp for entrance;Assistance with cooking/housework ?  ?Equipment Recommendations ? Other (comment) (tbd by next venue of care)  ?  ?Recommendations for Other Services   ? ? ?  ?Precautions / Restrictions Precautions ?Precautions: Fall ?Restrictions ?Weight Bearing Restrictions: Yes ?RLE Weight Bearing: Non weight bearing  ?  ? ?Mobility ? Bed Mobility ?  ?  ?  ?  ?  ?  ?  ?General bed mobility comments: NT. In recliner pre and post ?Patient Response: Cooperative ? ?Transfers ?Overall transfer level: Needs assistance ?Equipment used: Rolling walker (2 wheels) ?Transfers: Sit to/from Stand ?Sit to Stand: Min assist ?  ?  ?  ?  ?  ?General transfer comment: Requiring min VC's to prevent RLE WB when standing. Improved after initial cues with 100% success with future STS's after education an anterior scoot and trunk lean prior to stand. ?  ? ?Ambulation/Gait ?Ambulation/Gait assistance: Min guard ?Gait Distance (Feet): 9 Feet (3x3' bouts with chair follow and hop to gait) ?Assistive device: Rolling walker (2 wheels) ?Gait Pattern/deviations: Drifts right/left, Trunk flexed (hop to) ?  ?  ?  ?General Gait Details: chair  follow for hop to gait with RW with heavy UE reliance and poor progression of RW with L foot hops/ ? ? ?Stairs ?  ?  ?  ?  ?  ? ? ?Wheelchair Mobility ?  ? ?Modified Rankin (Stroke Patients Only) ?  ? ? ?  ?Balance Overall balance assessment: Needs assistance ?Sitting-balance support: No upper extremity supported ?Sitting  balance-Leahy Scale: Good ?  ?Postural control: Left lateral lean ?Standing balance support: Bilateral upper extremity supported, Reliant on assistive device for balance ?Standing balance-Leahy Scale: Poor ?Standing balance comment: Initial L lateral lean but able to correct after VC's ?  ?  ?  ?  ?  ?  ?  ?  ?  ?  ?  ?  ? ?  ?Cognition Arousal/Alertness: Awake/alert ?Behavior During Therapy: [facility] for tasks assessed/performed ?Overall Cognitive Status: Within Functional Limits for tasks assessed ?  ?  ?  ?  ?  ?  ?  ?  ?  ?  ?  ?  ?  ?  ?  ?  ?General Comments: Remains slightly impulsive but follows commands and is redirectable ?  ?  ? ?  ?Exercises General Exercises - Lower Extremity ?Long Arc Quad: AROM, Strengthening, Both, 15 reps, Seated ?Hip Flexion/Marching: AROM, Strengthening, Both, 15 reps, Seated ?Other Exercises ?Other Exercises: How to coordinate RW with Hop-to gait for improved balance ? ?  ?General Comments General comments (skin integrity, edema, etc.): Max HR up to 127 BPM with increased WOB with hop to gait ?  ?  ? ?Pertinent Vitals/Pain Pain Assessment ?Pain Assessment: Faces ?Faces Pain Scale: Hurts a little bit ?Pain Location: R foot ?Pain Intervention(s): Limited activity within patient's tolerance, Monitored during session, Repositioned  ? ? ?Home Living   ?  ?  ?  ?  ?  ?  ?  ?  ?  ?   ?  ?Prior Function    ?  ?  ?   ? ?PT Goals (current goals can now be found in the care plan section) Acute Rehab PT Goals ?Patient Stated Goal: to go home ?PT Goal Formulation: With patient ?Time For Goal Achievement: 01/20/22 ?Potential to Achieve Goals: Fair ?Progress towards PT goals: Progressing toward goals ? ?  ?Frequency ? ? ? 7X/week ? ? ? ?  ?PT Plan Current plan remains appropriate  ? ? ?Co-evaluation   ?  ?  ?  ?  ? ?  ?AM-PAC PT "6 Clicks" Mobility   ?Outcome Measure ? Help needed turning from your back to your side while in a flat bed without using bedrails?: A Little ?Help needed moving from lying  on your back to sitting on the side of a flat bed without using bedrails?: A Little ?Help needed moving to and from a bed to a chair (including a wheelchair)?: A Lot ?Help needed standing up from a chair using your arms (e.g., wheelchair or bedside chair)?: A Lot ?Help needed to walk in hospital room?: A Lot ?Help needed climbing 3-5 steps with a railing? : Total ?6 Click Score: 13 ? ?  ?End of Session Equipment Utilized During Treatment: Gait belt ?Activity Tolerance: Patient tolerated treatment well ?Patient left: in chair;with call bell/phone within reach ?Nurse Communication: Mobility status ?PT Visit Diagnosis: Unsteadiness on feet (R26.81);Other abnormalities of gait and mobility (R26.89);Muscle weakness (generalized) (M62.81) ?  ? ? ?Time: 0160-1093 ?PT Time Calculation (min) (ACUTE ONLY): 17 min ? ?Charges:  $Gait Training: 8-22 mins          ?          ? ?  Delphia GratesMilton M. Fairly IV, PT, DPT ?Physical Therapist- [place]  ?[facility]  ?01/07/2022, 11:01 AM ? ?

## 2022-01-07 NOTE — Progress Notes (Signed)
Daily Progress Note ? ? ?Subjective  - 2 Days Post-Op ? ?Follow-up transmetatarsal amputation right foot.  No complaint ? ?Objective ?Vitals:  ? 01/06/22 1631 01/06/22 2009 01/07/22 0518 01/07/22 0819  ?BP: 116/77 107/63 129/69 115/64  ?Pulse: 86 87 81 83  ?Resp: 18 17 20 19   ?Temp: 98.1 ?F (36.7 ?C) 99.2 ?F (37.3 ?C) 98.2 ?F (36.8 ?C) 98.8 ?F (37.1 ?C)  ?TempSrc: Oral Oral    ?SpO2: 97% 97% 98% 97%  ?Weight:      ?Height:      ? ? ?Physical Exam: ?The incision is coapting quite nicely.  He still has the partial residual ulceration to the dorsal lateral aspect of the foot from preoperatively.  Minimal drainage today. ? ?Laboratory ?CBC ?   ?Component Value Date/Time  ? WBC 5.1 01/04/2022 0603  ? HGB 9.7 (L) 01/04/2022 0603  ? HCT 29.3 (L) 01/04/2022 0603  ? PLT 350 01/04/2022 0603  ? ? ?BMET ?   ?Component Value Date/Time  ? NA 134 (L) 01/04/2022 0603  ? NA 140 04/04/2013 1238  ? K 5.1 01/04/2022 0603  ? K 4.1 04/04/2013 1238  ? CL 109 01/04/2022 0603  ? CL 108 (H) 04/04/2013 1238  ? CO2 19 (L) 01/04/2022 0603  ? CO2 30 04/04/2013 1238  ? GLUCOSE 131 (H) 01/04/2022 0603  ? GLUCOSE 98 04/04/2013 1238  ? BUN 20 01/04/2022 0603  ? BUN 19 (H) 04/04/2013 1238  ? CREATININE 1.13 01/07/2022 0317  ? CREATININE 0.87 04/04/2013 1238  ? CALCIUM 8.8 (L) 01/04/2022 0603  ? CALCIUM 8.8 04/04/2013 1238  ? GFRNONAA >60 01/07/2022 0317  ? GFRNONAA >60 04/04/2013 1238  ? GFRAA >60 05/22/2017 0853  ? GFRAA >60 04/04/2013 1238  ? ? ?Assessment/Planning: ?Status post transmetatarsal amputation right foot for osteomyelitis and gangrenous changes ? ?The wound looks good.  At this point dressings can be performed just 3 times a week with a dry dressing change upon discharge to SNF.  Should minimize weightbearing to the foot.  Only weight-bear to the heel if absolutely necessary.  Otherwise should remain nonweightbearing to the right foot. ?Should follow-up with podiatry in 2 weeks in the outpatient clinic.  Patient stable for discharge  from podiatry standpoint. ? ?06/04/2013 A ? ?01/07/2022, 12:33 PM ? ?

## 2022-01-07 NOTE — Progress Notes (Signed)
? ?  Date of Admission:  01/03/2022    ? ? ?ID: [name] is a 71 y.o. male  ?Principal Problem: ?  Diabetic foot infection ([place]) ?Active Problems: ?  Hyperlipidemia ?  Peripheral vascular disease ([place]) ?  HTN (hypertension) ?  Type II diabetes mellitus with renal manifestations ([place]) ?  Acute renal failure superimposed on stage 3a chronic kidney disease ([place]) ?  Iron deficiency anemia ?  Hyperkalemia ? ? ? ?Subjective: ?Pt doing better ?No specific complaints ? ?Medications:  ? atorvastatin  10 mg Oral Daily  ? docusate sodium  100 mg Oral BID  ? ferrous sulfate  325 mg Oral BID WC  ? heparin  5,000 Units Subcutaneous Q8H  ? insulin aspart  0-5 Units Subcutaneous QHS  ? insulin aspart  0-9 Units Subcutaneous TID WC  ? insulin glargine-yfgn  15 Units Subcutaneous QHS  ? multivitamin with minerals  1 tablet Oral Daily  ? polyethylene glycol  17 g Oral BID  ? ? ?Objective: ?Vital signs in last 24 hours: ?Temp:  [98.1 ?F (36.7 ?C)-99.2 ?F (37.3 ?C)] 98.8 ?F (37.1 ?C) (05/09 [id number]) ?Pulse Rate:  [81-87] 83 (05/09 0819) ?Resp:  [17-20] 19 (05/09 0819) ?BP: (107-129)/(63-77) 115/64 (05/09 [id number]) ?SpO2:  [97 %-98 %] 97 % (05/09 0819) ? ?LDA ?none ? ?PHYSICAL EXAM:  ?General: Alert, cooperative, no distress, appears stated age.  ?Lungs: Clear to auscultation bilaterally. No Wheezing or Rhonchi. No rales. ?Heart: Regular rate and rhythm, no murmur, rub or gallop. ?Abdomen: Soft, non-tender,not distended. Bowel sounds normal. No masses ?Extremities: atraumatic, no cyanosis. No edema. No clubbing ?Skin: No rashes or lesions. Or bruising ?Lymph: Cervical, supraclavicular normal. ?Neurologic: Grossly non-focal ? ?Lab Results ?Recent Labs  ?  01/07/22 ?0317  ?CREATININE 1.13  ? ? ?Microbiology: ?bc: 01/03/2022 blood cultures anaerobic gram-positive cocci ?01/05/2022 wound culture has group B streptococcus, few Enterococcus, gram-negative rods. ? ?Assessment/Plan: ? 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  Status post TMA.  Culture so far group B strep, few Enterococcus, gram-negative rods. ?Pathology pending. ? ?Anaerobic bacteremia in the blood culture.  Currently on ceftriaxone and Flagyl.  Awaiting ID and susceptibility.  Will need 2 weeks of IV antibiotics minimum.  Can get PICC but need to wait for final antibiotic recommendation. ? ?Diabetes mellitus and is on insulin ? ?AKI: Improved ? ?Anemia ? ?Discussed the management with the patient and hospitalist. ? ? ?  ?

## 2022-01-07 NOTE — Plan of Care (Signed)

## 2022-01-07 NOTE — Progress Notes (Addendum)
[place] at [facility] ? ? ?PATIENT NAME: [name]   ? ?MR#:  [id number] ? ?DATE OF BIRTH:  08-Nov-1950 ? ?SUBJECTIVE:  ?came in with complains of worsening wound to his right foot. He has been followed to the wound care center for some time. Has history of lymphedema and diabetes. ?MRI positive for osteomyelitis ? ? POD Right trans metatarsal amputation\ ?patient out in the chair earlier. Feels better. As postop wedge boot. ? ? ?VITALS:  ?Blood pressure 115/64, pulse 83, temperature 98.8 ?F (37.1 ?C), resp. rate 19, height 5\' 11"  (1.803 m), weight 127 kg, SpO2 97 %. ? ?PHYSICAL EXAMINATION:  ? ?GENERAL:  71 y.o.-year-old patient lying in the bed with no acute distress.  ?LUNGS: Normal breath sounds bilaterally ?CARDIOVASCULAR: S1, S2 normal. No murmurs  ?ABDOMEN: Soft, nontender, nondistended. ?Right foot :  ? ?    ?NEUROLOGIC: nonfocal  patient is alert and awake ? ?LABORATORY PANEL:  ?CBC ?Recent Labs  ?Lab 01/04/22 ?0603  ?WBC 5.1  ?HGB 9.7*  ?HCT 29.3*  ?PLT 350  ? ? ? ?Chemistries  ?Recent Labs  ?Lab 01/03/22 ?1158 01/04/22 ?0603 01/07/22 ?0317  ?NA 136 134*  --   ?K 5.2* 5.1  --   ?CL 106 109  --   ?CO2 18* 19*  --   ?GLUCOSE 133* 131*  --   ?BUN 29* 20  --   ?CREATININE 1.92* 1.47* 1.13  ?CALCIUM 9.1 8.8*  --   ?AST 19  --   --   ?ALT 12  --   --   ?ALKPHOS 74  --   --   ?BILITOT 0.5  --   --   ? ? ?Cardiac Enzymes ?No results for input(s): TROPONINI in the last 168 hours. ?RADIOLOGY:  ?[place] EKG SITE RITE ? ?Result Date: 01/07/2022 ?If [employer] not attached, placement could not be confirmed due to current cardiac rhythm.  ? ?Assessment and Plan ?[name] is a 71 y.o. male with medical history significant of PVD, diabetes mellitus, hypertension, hyperlipidemia, right leg DVT off Xarelto, CKD-3A, lymphedema, obesity with BMI 39.05, iron deficiency anemia, who presents with right foot infection. ? ?Diabetic right foot infection with osteomyelitis: Patient has  elevated lactic acid 2.8, but does not meets critical for sepsis.  ?-- Consulted Dr. Lucky Cowboy of vascular surgery and Dr. Vickki Muff of podiatry. ? ?- Empiric antimicrobial treatment with Cipro and Flagyl ?- PRN Zofran for nausea ?- Blood cultures gram-positive cocci, BCID negative ?- MRI-right foot 1. Septic arthritis and osteomyelitis of the fourth PIP joint. ?2. Severe bone marrow edema in the fourth distal phalanx most ?concerning for osteomyelitis. ?3. Mild bone marrow edema in the second and third middle and distal ?phalanx concerning for osteomyelitis. ?4. Prior fifth transmetatarsal amputation. Mild bone marrow edema in ?the fifth metatarsal stump concerning for residual osteomyelitis ? ?-- Dr. Vickki Muff plants trans metatarsal amputation 01/05/2022 ?-- PT initiated after discussion with podiatry. Not weight bearing status. ?-- TOC for discharge planning to rehab ?-- ID consultation for antibiotics-- IV Rocephin + IV Flagyl ?-- will get PICC line placed today. ?-- Okay from podiatry standpoint for discharge follow-up in two weeks. ?  ?Hyperkalemia ?Potassium 5.2 ?-received Lokelma ?  ?Iron deficiency anemia ?- Hemoglobin stable, 11.0 ?-Continue iron supplement ?  ?Acute renal failure superimposed on stage 3a chronic kidney disease ([place]) ?--Baseline creatinine 0.91 on 05/22/2017.  No recent baseline creatinine available.  His creatinine is 1.92, BUN 29. ?-Hold Prinzide ?-IV  fluid as above ?  ?Type II diabetes mellitus with renal manifestations ([place]) ?Recent A1c 8.7, well controlled.  Patient is taking Humalog, metformin and Lantus to 15 units daily ?-Sliding scale insulin ?-Glargine insulin 10 units daily ?  ?HTN (hypertension) ?- Hold lisinopril-HCTZ due to worsening renal function ?-IV hydralazine as needed ?  ?Peripheral vascular disease ([place]) ?- Consulted vascular surgery--will consider LE angiogram depending on blood flow noted during amputation ?-- per Dr. Vickki Muff good blood flow during surgery. Will hold off further  lower extremity angiogram for now. ?  ?Hyperlipidemia ?- Lipitor ?  ?  ? ?Procedures: right foot trans metatarsal amputation ?Family communication none ?Consults : ID, vascular surgery and podiatry ?CODE STATUS: full ?DVT Prophylaxis : heparin ?Level of care: Med-Surg ?Status is: Inpatient ?Remains inpatient appropriate because: right foot surgery.  ?[facility]. Awaiting insurance authorization. Will get PICC line placed. ? ?TOTAL TIME TAKING CARE OF THIS PATIENT: 35 minutes.  ?>50% time spent on counselling and coordination of care ? ?Note: This dictation was prepared with Dragon dictation along with smaller phrase technology. Any transcriptional errors that result from this process are unintentional. ? ?Fritzi Mandes M.D  ? ? ?[facility]  ? ?CC: ?Primary care physician; Frazier Richards, MD  ?

## 2022-01-07 NOTE — TOC Progression Note (Signed)
Transition of Care (TOC) - Progression Note  ? ? ?Patient Details  ?Name: [name] ?MRN: [id number] ?Date of Birth: [birth year] ? ?Transition of Care (TOC) CM/SW Contact  ?Marlowe Sax, RN ?Phone Number: ?01/07/2022, 10:07 AM ? ?Clinical Narrative:    ?Reviewed bed choices with the patient he chose [facility], I notified [facility] at [facility],  ?Ins auth pending ?  ?  ? ?Expected Discharge Plan and Services ?  ?  ?  ?  ?  ?                ?  ?  ?  ?  ?  ?  ?  ?  ?  ?  ? ? ?Social Determinants of Health (SDOH) Interventions ?  ? ?Readmission Risk Interventions ?   ? View : No data to display.  ?  ?  ?  ? ? ?

## 2022-01-07 NOTE — Progress Notes (Signed)
Pt has not had bowel movement since 01/03/2022. He is taking colace and miralax. Educated pt on other options such as suppository. Pt refused at this time. ?

## 2022-01-07 NOTE — Progress Notes (Signed)
PICC consent obtained. Patient wants PICC to be placed tomorrow morning. RN made aware. ?

## 2022-01-08 DIAGNOSIS — L089 Local infection of the skin and subcutaneous tissue, unspecified: Secondary | ICD-10-CM | POA: Diagnosis not present

## 2022-01-08 DIAGNOSIS — E11628 Type 2 diabetes mellitus with other skin complications: Secondary | ICD-10-CM | POA: Diagnosis not present

## 2022-01-08 LAB — GLUCOSE, CAPILLARY
Glucose-Capillary: 150 mg/dL — ABNORMAL HIGH (ref 70–99)
Glucose-Capillary: 188 mg/dL — ABNORMAL HIGH (ref 70–99)
Glucose-Capillary: 221 mg/dL — ABNORMAL HIGH (ref 70–99)

## 2022-01-08 LAB — CULTURE, BLOOD (ROUTINE X 2)
Special Requests: ADEQUATE
Special Requests: ADEQUATE

## 2022-01-08 LAB — SURGICAL PATHOLOGY

## 2022-01-08 MED ORDER — LISINOPRIL 5 MG PO TABS
5.0000 mg | ORAL_TABLET | Freq: Every day | ORAL | Status: DC
  Administered 2022-01-08: 5 mg via ORAL
  Filled 2022-01-08: qty 1

## 2022-01-08 MED ORDER — BISACODYL 10 MG RE SUPP
10.0000 mg | Freq: Once | RECTAL | Status: AC
  Administered 2022-01-08: 10 mg via RECTAL
  Filled 2022-01-08: qty 1

## 2022-01-08 MED ORDER — CHLORHEXIDINE GLUCONATE CLOTH 2 % EX PADS
6.0000 | MEDICATED_PAD | Freq: Every day | CUTANEOUS | Status: DC
  Administered 2022-01-08: 6 via TOPICAL

## 2022-01-08 MED ORDER — SODIUM CHLORIDE 0.9% FLUSH
10.0000 mL | Freq: Two times a day (BID) | INTRAVENOUS | Status: DC
  Administered 2022-01-08: 10 mL

## 2022-01-08 MED ORDER — LISINOPRIL 5 MG PO TABS
5.0000 mg | ORAL_TABLET | Freq: Every day | ORAL | 3 refills | Status: DC
Start: 2022-01-08 — End: 2024-05-31

## 2022-01-08 MED ORDER — SODIUM CHLORIDE 0.9% FLUSH: 10.0000 mL | INTRAVENOUS | Status: DC | PRN

## 2022-01-08 MED ORDER — ERTAPENEM IV (FOR PTA / DISCHARGE USE ONLY): 1.0000 g | INTRAVENOUS | 0 refills | Status: AC

## 2022-01-08 MED ORDER — SODIUM CHLORIDE 0.9 % IV SOLN
1.0000 g | INTRAVENOUS | Status: DC
  Administered 2022-01-08: 1000 mg via INTRAVENOUS
  Filled 2022-01-08: qty 1

## 2022-01-08 NOTE — TOC Progression Note (Signed)
Transition of Care (TOC) - Progression Note  ? ? ?Patient Details  ?Name: [name] ?MRN: [id number] ?Date of Birth: 1951-05-03 ? ?Transition of Care (TOC) CM/SW Contact  ?Marlowe Sax, RN ?Phone Number: ?01/08/2022, 11:05 AM ? ?Clinical Narrative:    ?Received Ins approval to go to [facility] They are able to manage Ertapenem per Tammy, Approval number [id number] start date 5/10 next review 5/12 ? ? ?Expected Discharge Plan: Skilled Nursing Facility ?Barriers to Discharge: Continued Medical Work up ? ?Expected Discharge Plan and Services ?Expected Discharge Plan: Skilled Nursing Facility ?  ?  ?  ?  ?                ?  ?  ?  ?  ?  ?  ?  ?  ?  ?  ? ? ?Social Determinants of Health (SDOH) Interventions ?  ? ?Readmission Risk Interventions ?   ? View : No data to display.  ?  ?  ?  ? ? ?

## 2022-01-08 NOTE — Progress Notes (Signed)
Report called to [facility] @ [facility]. Pt waiting on EMS. ?

## 2022-01-08 NOTE — Plan of Care (Signed)
?  Problem: Education: ?Goal: Knowledge of General Education information will improve ?Description: Including pain rating scale, medication(s)/side effects and non-pharmacologic comfort measures ?Outcome: Progressing ?  ?Problem: Health Behavior/Discharge Planning: ?Goal: Ability to manage health-related needs will improve ?Outcome: Progressing ?  ?Problem: Clinical Measurements: ?Goal: Ability to maintain clinical measurements within normal limits will improve ?Outcome: Progressing ?Goal: Will remain free from infection ?Outcome: Progressing ?Goal: Diagnostic test results will improve ?Outcome: Progressing ?Goal: Respiratory complications will improve ?Outcome: Progressing ?Goal: Cardiovascular complication will be avoided ?Outcome: Progressing ?  ?Problem: Activity: ?Goal: Risk for activity intolerance will decrease ?Outcome: Progressing ?  ?Problem: Nutrition: ?Goal: Adequate nutrition will be maintained ?Outcome: Progressing ?  ?Problem: Coping: ?Goal: Level of anxiety will decrease ?Outcome: Progressing ?  ?Problem: Elimination: ?Goal: Will not experience complications related to bowel motility ?Outcome: Not Progressing ?Goal: Will not experience complications related to urinary retention ?Outcome: Progressing ?  ?Problem: Pain Managment: ?Goal: General experience of comfort will improve ?Outcome: Progressing ?  ?Problem: Safety: ?Goal: Ability to remain free from injury will improve ?Outcome: Progressing ?  ?

## 2022-01-08 NOTE — Care Management Important Message (Signed)
Important Message ? ?Patient Details  ?Name: [name] ?MRN: [id number] ?Date of Birth: 1951-03-30 ? ? ?Medicare Important Message Given:  Yes ? ? ? ? ?Olegario Messier A Macy Lingenfelter ?01/08/2022, 1:33 PM ?

## 2022-01-08 NOTE — Discharge Summary (Signed)
?Physician Discharge Summary ?  ?Patient: [name] MRN: [id number] DOB: [birth year]  ?Admit date:     01/03/2022  ?Discharge date: 01/08/22  ?Discharge Physician: Fritzi Mandes  ? ?PCP: Frazier Richards, MD  ? ?Recommendations at discharge:  ? ? ?follow-up PCP in 1 to 2 weeks ?follow-up Dr. Steva Ready at 10:45 AM on 23rd of May ?follow-up podiatry Dr. Vickki Muff in two weeks ? 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 ?Hospital Course: ?ZYGMUNT MCGLINN is a 71 y.o. male with medical history significant of PVD, diabetes mellitus, hypertension, hyperlipidemia, right leg DVT off Xarelto, CKD-3A, lymphedema, obesity with BMI 39.05, iron deficiency anemia, who presents with right foot infection. ?  ?Diabetic right foot infection with osteomyelitis: Patient has elevated lactic acid 2.8, but does not meets critical for sepsis.  ?-- Consulted Dr. Lucky Cowboy of vascular surgery and Dr. Vickki Muff of podiatry. ?-- Empiric antimicrobial treatment with Cipro and Flagyl--changed to IV ertapenem ?- PRN Zofran for nausea ?- Blood cultures gram-positive cocci, BCID negative ?- MRI-right foot 1. Septic arthritis and osteomyelitis of the fourth PIP joint. ?2. Severe bone marrow edema in the fourth distal phalanx most ?concerning for osteomyelitis. ?3. Mild bone marrow edema in the second and third middle and distal ?phalanx concerning for osteomyelitis. ?4. Prior fifth transmetatarsal amputation. Mild bone marrow edema in ?the fifth metatarsal stump concerning for residual osteomyelitis ?  ?-- Dr. Vickki Muff plants trans metatarsal amputation 01/05/2022 ?-- PT initiated after discussion with podiatry. Not weight bearing status. ?-- TOC for discharge planning to rehab ?-- ID consultation for antibiotics ?-- s/p PICC line placement ?-- Okay from podiatry standpoint for discharge follow-up in two weeks. ? ?OPAT Orders ?Discharge antibiotics: ?Ertapenem 1 gram IV every 24 hours for 2 weeks ?End  Date: 01/21/22 ?  ?  ?[facility] Care Per Protocol: ?  ?Labs weekly while on IV antibiotics: ?_X_ CBC with differential ?  ?_X_ CMP ?_X_ CRP ?_X_ ESR ?  ?  ?_X_ Please pull PIC at completion of IV antibiotics ?_ ?Fax weekly labs to [phone] ?  ?Clinic Follow Up Appt: with Dr.Ravishankar 01/21/22 at 10.45 AM ?  ?  ?Call [phone] with any questions or critical value ?  ?Hyperkalemia ?Potassium 5.2 ?-received Lokelma ?  ?Iron deficiency anemia ?- Hemoglobin stable, 11.0 ?-Continue iron supplement ?  ?Acute renal failure superimposed on stage 3a chronic kidney disease ([place]) ?--Baseline creatinine 0.91 on 05/22/2017.  No recent baseline creatinine available.  His creatinine is 1.92, BUN 29. ?-Hold Prinzide ?-IV fluid as above ?  ?Type II diabetes mellitus with renal manifestations ([place]) ?Recent A1c 8.7, well controlled.  Patient is taking Humalog, metformin and Lantus to 15 units daily ?-Sliding scale insulin ? ?HTN (hypertension) ?- Hold lisinopril-HCTZ due to worsening renal function ?-IV hydralazine as needed ?--lisinopril ?  ?Peripheral vascular disease ([place]) ?--Consulted vascular surgery--will consider LE angiogram depending on blood flow noted during amputation ?-- per Dr. Vickki Muff good blood flow during surgery. Will hold off further lower extremity angiogram for now. ?  ?Hyperlipidemia ?- Lipitor ?  ?medically best at baseline for discharge to rehab. Patient in agreement. [facility] ?  ?Procedures: right foot trans metatarsal amputation ?Family communication none ?Consults : ID, vascular surgery and podiatry ?CODE STATUS: full ?DVT Prophylaxis : heparin ?Level of care: Med-Surg ?Status is: Inpatient ? ? ?  ? ? ? ?Disposition: Rehabilitation facility ?Diet recommendation:  ?Discharge Diet Orders (From admission, onward)  ? ?  Start     Ordered  ? 01/08/22 0000  Diet - low sodium heart healthy       ? 01/08/22 1111  ? ?  ?  ? ?  ? ?Cardiac and Carb modified diet ?DISCHARGE MEDICATION: ?Allergies  as of 01/08/2022   ? ?   Reactions  ? Sulfa Antibiotics Rash  ? Zosyn [piperacillin Sod-tazobactam So] Rash  ? ?  ? ?  ?Medication List  ?  ? ?STOP taking these medications   ? ?bacitracin ointment ?  ?doxycycline 100 MG tablet ?Commonly known as: VIBRA-TABS ?  ?empagliflozin 10 MG Tabs tablet ?Commonly known as: JARDIANCE ?  ?lisinopril-hydrochlorothiazide 20-25 MG tablet ?Commonly known as: ZESTORETIC ?  ?oxyCODONE 5 MG immediate release tablet ?Commonly known as: Oxy IR/ROXICODONE ?  ?Restore CalciCare Dressing Misc ?  ?rivaroxaban 20 MG Tabs tablet ?Commonly known as: XARELTO ?  ?silver sulfADIAZINE 1 % cream ?Commonly known as: SILVADENE ?  ? ?  ? ?TAKE these medications   ? ?Accu-Chek Aviva Plus test strip ?Generic drug: glucose blood ?  ?Accu-Chek Guide test strip ?Generic drug: glucose blood ?Use 1 strip via meter four times a day  to monitor blood glucose ?  ?Accu-Chek Softclix Lancets lancets ?  ?acetaminophen 325 MG tablet ?Commonly known as: TYLENOL ?Take 650 mg by mouth every 6 (six) hours as needed. ?  ?atorvastatin 10 MG tablet ?Commonly known as: LIPITOR ?TAKE 1 TABLET  EACH  EVENING FOR HIGH CHOLESTEROL ?What changed: Another medication with the same name was removed. Continue taking this medication, and follow the directions you see here. ?  ?CENTROVITE Tabs ?Take 1 tablet by mouth daily. ?  ?docusate sodium 100 MG capsule ?Commonly known as: COLACE ?Take 1 capsule (100 mg total) by mouth 2 (two) times daily. ?  ?ertapenem  IVPB ?Commonly known as: INVANZ ?Inject 1 g into the vein daily for 12 days. Indication:  anaerobic bacteremia and polymicrobial foot infection ?Last Day of Therapy:  01/21/2022 ?Labs - Once weekly:  CBC/D, ESR, CRP and CMP ?Please pull PIC at completion of IV antibiotics ?Fax weekly labs to [phone] ?Start taking on: Jan 09, 2022 ?  ?FeroSul 325 (65 FE) MG tablet ?Generic drug: ferrous sulfate ?Take 325 mg by mouth 2 (two) times daily with a meal. ?  ?GAS-X ULTRA STRENGTH  PO ?Take 1 capsule by mouth 4 (four) times daily as needed (for flatulence). ?  ?insulin glargine 100 UNIT/ML injection ?Commonly known as: LANTUS ?Inject 0.15 mLs (15 Units total) into the skin at bedtime. ?  ?insulin lispro 100 UNIT/ML injection ?Commonly known as: HUMALOG ?Inject 4 Units into the skin See admin instructions. Inject 4 units SQ with breakfast, inject 4 units SQ with lunch and inject 4 units SQ with dinner ?  ?Insulin Syringe-Needle U-100 31G X 5/16" 0.3 ML Misc ?  ?Droplet Insulin Syringe 31G X 5/16" 0.3 ML Misc ?Generic drug: Insulin Syringe-Needle U-100 ?USE FIVE TIMES DAILY AS DIRECTED ?  ?lisinopril 5 MG tablet ?Commonly known as: ZESTRIL ?Take 1 tablet (5 mg total) by mouth daily. ?  ?metFORMIN 1000 MG tablet ?Commonly known as: GLUCOPHAGE ?TAKE 1 TABLET TWICE DAILY FOR DIABETES ?What changed: Another medication with the same name was removed. Continue taking this medication, and follow the directions you see here. ?  ?polyethylene glycol 17 g packet ?Commonly known as: MIRALAX / GLYCOLAX ?Take 17 g by mouth daily. ?  ?senna 8.6 MG Tabs tablet ?Commonly known as: SENOKOT ?Take 1 tablet by mouth  2 (two) times daily. ?  ? ?  ? ?  ?  ? ? ?  ?Home Infusion Instuctions  ?(From admission, onward)  ?  ? ? ?  ? ?  Start     Ordered  ? 01/08/22 0000  Home infusion instructions       ?Question:  Instructions  Answer:  Flushing of vascular access device: 0.9% NaCl pre/post medication administration and prn patency; Heparin 100 u/ml, 76m for implanted ports and Heparin 10u/ml, 551mfor all other central venous catheters.  ? 01/08/22 1111  ? ?  ?  ? ?  ? ? ?  ?Discharge Care Instructions  ?(From admission, onward)  ?  ? ? ?  ? ?  Start     Ordered  ? 01/08/22 0000  Discharge wound care:       ?Comments: Per Dr FoVickki Muff?At this point dressings can be performed just 3 times a week with a dry dressing change upon discharge to SNF.  Should minimize weightbearing to the foot.  Only weight-bear to the heel if  absolutely necessary.  Otherwise should remain nonweightbearing to the right foot.  ? 01/08/22 1111  ? ?  ?  ? ?  ? ? Contact information for follow-up providers   ? ? FoSamara DeistDPM. Schedule an ap

## 2022-01-08 NOTE — Treatment Plan (Signed)
Diagnosis: ?Diabetic foot infection and anerobic coccil bacteremia ?Baseline Creatinine 1.13 ? ?Culture Result: anerococcus prevotii bacteremia ?Proteus, enterococcus  ? ?Allergies  ?Allergen Reactions  ? Sulfa Antibiotics Rash  ? Zosyn [Piperacillin Sod-Tazobactam So] Rash  ? ? ?OPAT Orders ?Discharge antibiotics: ?Ertapenem 1 gram IV every 24 hours for 2 weeks ?End Date: 01/21/22 ? ? ?[facility] Care Per Protocol: ? ?Labs weekly while on IV antibiotics: ?_X_ CBC with differential ? ?_X_ CMP ?_X_ CRP ?_X_ ESR ? ? ?_X_ Please pull PIC at completion of IV antibiotics ?_ ?Fax weekly labs to [phone] ? ?Clinic Follow Up Appt: with Dr.Jovi Zavadil 01/21/22 at 10.45 AM ? ? ?Call [phone] with any questions or critical value ? ?  ?

## 2022-01-08 NOTE — Progress Notes (Signed)
PHARMACY CONSULT NOTE FOR: ? ?OUTPATIENT  PARENTERAL ANTIBIOTIC THERAPY (OPAT) ? ?Indication: anaerobic bacteremia and diabetic foot infection ?Regimen: ertapenem 1gm IV q24h ?End date: 01/21/2022 ? ?IV antibiotic discharge orders are pended. ?To discharging provider:  please sign these orders via discharge navigator,  ?Select New Orders & click on the button choice - Manage This Unsigned Work.  ?  ? ?Thank you for allowing pharmacy to be a part of this patient's care. ? ?Juliette Alcide, PharmD, BCPS, BCIDP ?Work Cell: [phone] ?01/08/2022 10:48 AM ? ? ? ?

## 2022-01-08 NOTE — Progress Notes (Signed)
Peripherally Inserted Central Catheter Placement ? ?The IV Nurse has discussed with the patient and/or persons authorized to consent for the patient, the purpose of this procedure and the potential benefits and risks involved with this procedure.  The benefits include less needle sticks, lab draws from the catheter, and the patient may be discharged home with the catheter. Risks include, but not limited to, infection, bleeding, blood clot (thrombus formation), and puncture of an artery; nerve damage and irregular heartbeat and possibility to perform a PICC exchange if needed/ordered by physician.  Alternatives to this procedure were also discussed.  Bard Power PICC patient education guide, fact sheet on infection prevention and patient information card has been provided to patient /or left at bedside.   ? ?PICC Placement Documentation  ?PICC Single Lumen 01/08/22 Right Cephalic 42 cm 0 cm (Active)  ?Indication for Insertion or Continuance of Line Prolonged intravenous therapies 01/08/22 1103  ?Exposed Catheter (cm) 0 cm 01/08/22 1103  ?Site Assessment Clean, Dry, Intact 01/08/22 1103  ?Line Status Flushed;Saline locked;Blood return noted 01/08/22 1103  ?Dressing Type Transparent;Securing device 01/08/22 1103  ?Dressing Status Antimicrobial disc in place 01/08/22 1103  ?Dressing Intervention New dressing;Other (Comment) 01/08/22 1103  ?Dressing Change Due 01/15/22 01/08/22 1103  ? ? ? ? ? ?Reginia Forts Albarece ?01/08/2022, 11:05 AM ? ?

## 2022-01-08 NOTE — Progress Notes (Signed)
Physical Therapy Treatment ?Patient Details ?Name: [name] ?MRN: [id number] ?DOB: [birth year] ?Today's Date: 01/08/2022 ? ? ?History of Present Illness [name] is a 71 y.o. male with medical history significant of PVD, diabetes mellitus, hypertension, hyperlipidemia, right leg DVT off Xarelto, CKD-3A, lymphedema, obesity with BMI 39.05, iron deficiency anemia, who presents with right foot infection. 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 ? ?  ?PT Comments  ? ? Pt was sitting EOB upon arriving. He is A and O x 4 however author questions if he understands the severity of situation. Lengthy education on positioning to promote healing. Issued pt HEP for seated and supine there ex. He demonstrates ability to perform without difficulty. Encouraged pt to keep LE elevated as often as he can. He was able to stand and take a few steps to pivot to recliner but struggles with NWB status. He did use darco shoe. Mod assist to stand form low surface height. Min assist form elevated surface. He will benefit from SNF at DC to maximize independence prior to returning home. Acute PT will continue to follow as able per current POC. ?  ?Recommendations for follow up therapy are one component of a multi-disciplinary discharge planning process, led by the attending physician.  Recommendations may be updated based on patient status, additional functional criteria and insurance authorization. ? ?Follow Up Recommendations ? Skilled nursing-short term rehab (<3 hours/day) ?  ?  ?Assistance Recommended at Discharge Frequent or constant Supervision/Assistance  ?Patient can return home with the following A lot of help with walking and/or transfers;A lot of help with bathing/dressing/bathroom;Assist for transportation;Help with stairs or ramp for entrance;Assistance with cooking/housework ?  ?Equipment Recommendations ? Other (comment) (defer to next level of care. He states he has a wc)  ?  ?   ?Precautions /  Restrictions Precautions ?Precautions: Fall ?Restrictions ?Weight Bearing Restrictions: Yes ?RLE Weight Bearing: Non weight bearing  ?  ? ?Mobility ? Bed Mobility ? General bed mobility comments: pt was sitting EOB upon arriving ?  ? ?Transfers ?Overall transfer level: Needs assistance ?Equipment used: Rolling walker (2 wheels) ?Transfers: Sit to/from Stand ?Sit to Stand: Min assist, Mod assist, From elevated surface ?Stand pivot transfers: Min assist, From elevated surface ?  ?  ?  ?  ?General transfer comment: pt requires mod assist from lowest bed height however min assist form elevated surfaces. Vcs for imporved fwd wt shift and overall technique. Darco shoe donned throughout with vcs for NWB. pt stuggles to maintain ?  ? ?Ambulation/Gait ?Ambulation/Gait assistance: Min assist ?Gait Distance (Feet): 3 Feet ?Assistive device: Rolling walker (2 wheels) ?Gait Pattern/deviations: Trunk flexed ?Gait velocity: decreased ?  ?  ?  ?  ?Balance Overall balance assessment: Needs assistance ?Sitting-balance support: No upper extremity supported ?Sitting balance-Leahy Scale: Good ?  ?  ?Standing balance support: Bilateral upper extremity supported, Reliant on assistive device for balance ?Standing balance-Leahy Scale: Poor ?Standing balance comment: pt remains high fall risk due to insability in standing with constant VCs for maintaining NWB ?  ?   ?Cognition Arousal/Alertness: Awake/alert ?Behavior During Therapy: [facility] for tasks assessed/performed ?Overall Cognitive Status: Within Functional Limits for tasks assessed ?  ? General Comments: Pt is alert however author questions if he understand overall situation and concerns for foot healing. Discussed home environment and ways to promote wound healing. He states understanding and is planning to go to rehab prior to returning home with sister. ?  ?  ? ?  ?   ?General  Comments General comments (skin integrity, edema, etc.): author issued pt HEP for supine and seated ther ex.  Lengthy education on techniques to promote wound healing. recommended pt keep LE elevated to promote increased circulation. pt states understanding and was able to perform prescribed ther ex with vcs only ?  ?  ? ?Pertinent Vitals/Pain Pain Assessment ?Pain Assessment: No/denies pain ?Pain Score: 0-No pain ?Pain Descriptors / Indicators: Sore ?Pain Intervention(s): Limited activity within patient's tolerance, Monitored during session, Premedicated before session, Repositioned  ? ? ? ?PT Goals (current goals can now be found in the care plan section) Acute Rehab PT Goals ?Patient Stated Goal: rehab then home ?Progress towards PT goals: Progressing toward goals ? ?  ?Frequency ? ? ? 7X/week ? ? ? ?  ?PT Plan Current plan remains appropriate  ? ? ?   ?AM-PAC PT "6 Clicks" Mobility   ?Outcome Measure ? Help needed turning from your back to your side while in a flat bed without using bedrails?: A Little ?Help needed moving from lying on your back to sitting on the side of a flat bed without using bedrails?: A Little ?Help needed moving to and from a bed to a chair (including a wheelchair)?: A Lot ?Help needed standing up from a chair using your arms (e.g., wheelchair or bedside chair)?: A Lot ?Help needed to walk in hospital room?: A Lot ?Help needed climbing 3-5 steps with a railing? : Total ?6 Click Score: 13 ? ?  ?End of Session   ?Activity Tolerance: Patient tolerated treatment well ?Patient left: in chair;with call bell/phone within reach ?Nurse Communication: Mobility status ?PT Visit Diagnosis: Unsteadiness on feet (R26.81);Other abnormalities of gait and mobility (R26.89);Muscle weakness (generalized) (M62.81) ?  ? ? ?Time: 0932-3557 ?PT Time Calculation (min) (ACUTE ONLY): 23 min ? ?Charges:  $Therapeutic Exercise: 8-22 mins ?$Therapeutic Activity: 8-22 mins          ?          ? ?Jetta Lout PTA ?01/08/22, 8:25 AM  ? ?

## 2022-01-08 NOTE — TOC Progression Note (Addendum)
Transition of Care (TOC) - Progression Note  ? ? ?Patient Details  ?Name: [name] ?MRN: [id number] ?Date of Birth: 05-May-1951 ? ?Transition of Care (TOC) CM/SW Contact  ?Marlowe Sax, RN ?Phone Number: ?01/08/2022, 2:38 PM ? ?Clinical Narrative:    ? ?[facility] is going to [facility] room [facility] ?He will alert family ?EMS called and there is 1 ahead of him ? ?Expected Discharge Plan: Skilled Nursing Facility ?Barriers to Discharge: Continued Medical Work up ? ?Expected Discharge Plan and Services ?Expected Discharge Plan: Skilled Nursing Facility ?  ?  ?  ?  ?Expected Discharge Date: 01/08/22               ?  ?  ?  ?  ?  ?  ?  ?  ?  ?  ? ? ?Social Determinants of Health (SDOH) Interventions ?  ? ?Readmission Risk Interventions ?   ? View : No data to display.  ?  ?  ?  ? ? ?

## 2022-01-11 LAB — CULTURE, BLOOD (ROUTINE X 2)
Culture: NO GROWTH
Culture: NO GROWTH

## 2022-01-12 LAB — AEROBIC/ANAEROBIC CULTURE W GRAM STAIN (SURGICAL/DEEP WOUND): Gram Stain: NONE SEEN

## 2022-01-21 ENCOUNTER — Inpatient Hospital Stay: Payer: Medicare HMO | Admitting: Infectious Diseases

## 2022-01-23 ENCOUNTER — Ambulatory Visit: Payer: Medicare HMO | Attending: Infectious Diseases | Admitting: Infectious Diseases

## 2022-01-23 DIAGNOSIS — L0889 Other specified local infections of the skin and subcutaneous tissue: Secondary | ICD-10-CM | POA: Diagnosis present

## 2022-01-23 DIAGNOSIS — E11628 Type 2 diabetes mellitus with other skin complications: Secondary | ICD-10-CM | POA: Diagnosis not present

## 2022-01-23 DIAGNOSIS — E118 Type 2 diabetes mellitus with unspecified complications: Secondary | ICD-10-CM | POA: Insufficient documentation

## 2022-01-23 DIAGNOSIS — L089 Local infection of the skin and subcutaneous tissue, unspecified: Secondary | ICD-10-CM

## 2022-01-23 DIAGNOSIS — Z89431 Acquired absence of right foot: Secondary | ICD-10-CM | POA: Insufficient documentation

## 2022-01-23 DIAGNOSIS — B952 Enterococcus as the cause of diseases classified elsewhere: Secondary | ICD-10-CM | POA: Insufficient documentation

## 2022-01-23 DIAGNOSIS — Z09 Encounter for follow-up examination after completed treatment for conditions other than malignant neoplasm: Secondary | ICD-10-CM | POA: Diagnosis present

## 2022-01-23 DIAGNOSIS — B964 Proteus (mirabilis) (morganii) as the cause of diseases classified elsewhere: Secondary | ICD-10-CM | POA: Diagnosis not present

## 2022-01-23 DIAGNOSIS — B9689 Other specified bacterial agents as the cause of diseases classified elsewhere: Secondary | ICD-10-CM | POA: Diagnosis not present

## 2022-01-23 DIAGNOSIS — R7881 Bacteremia: Secondary | ICD-10-CM | POA: Insufficient documentation

## 2022-01-23 NOTE — Progress Notes (Signed)
The purpose of this virtual visit is to provide medical care while limiting exposure to the novel coronavirus (COVID19) for both patient and office staff.   Consent was obtained for video visit:  Yes.   Answered questions that patient had about telehealth interaction:  Yes.   I discussed the limitations, risks, security and privacy concerns of performing an evaluation and management service by telephone. I also discussed with the patient that there may be a patient responsible charge related to this service. The patient expressed understanding and agreed to proceed.   [facility] Provider Location: office People on the visit- [facility] from [facility], patient and provider Pt was being treated for diabetic foot infection rt foot and underwent T[facility] this [facility]-  : anerococcus prevotii bacteremia Proteus, enterococcus in wound culture It was therapeutic TMA and he got 2 weeks of IV ertapenem which he completed on 01/21/22 PICC has been removed The amputation site looks good- sutures well coapted- they need to be removed HE will follow up with podiatritst Follow PRN with me\ Discussed the management with aptient and his nurse in detail Total time spent on the call 12 min

## 2022-07-14 ENCOUNTER — Encounter (INDEPENDENT_AMBULATORY_CARE_PROVIDER_SITE_OTHER): Payer: Self-pay

## 2022-07-14 ENCOUNTER — Ambulatory Visit (INDEPENDENT_AMBULATORY_CARE_PROVIDER_SITE_OTHER): Payer: Medicare HMO | Admitting: Vascular Surgery

## 2022-07-14 DIAGNOSIS — I7025 Atherosclerosis of native arteries of other extremities with ulceration: Secondary | ICD-10-CM | POA: Insufficient documentation

## 2022-07-14 NOTE — Progress Notes (Deleted)
MRN : [id number]  [name] is a 71 y.o. (1951-01-03) male who presents with chief complaint of check circulation.  History of Present Illness:   The patient returns for 6 month follow up regarding a nonhealing wound to his right foot that is chronic and has been treated at the wound center. He was last seen by vascular on 01/04/2022.  He was seen by Dr. Ether Griffins for worsening infection in the [facility] ER  on 01/03/2022. The wound had a foul odor associated with it. Transmetatarsal amputation has been recommended which will be performed tomorrow.  The patient has a history of a right leg DVT in 2018. He has chronic lymphedema which has been managed by Dr. [name] with a lymph pump. He underwent angiography in 2018 and had three-vessel runoff with diffuse disease out onto the foot. No intervention was performed. The patient is a diabetic with stage III renal insufficiency. He is a former smoker. He takes a statin for hypercholesterolemia. He is medically managed for hypertension.   No outpatient medications have been marked as taking for the 07/14/22 encounter (Appointment) with [name], [name], MD.    Past Medical History:  Diagnosis Date   Arthritis    Bladder incontinence    Chronic kidney disease    Stage 3 Kidney disease   Dementia (HCC)    Diabetes mellitus without complication (HCC)    DVT of leg (deep venous thrombosis) (HCC)    H/O RIGHT LEG 2018   Dyspnea    occassional   Hyperlipidemia    Hypertension    Peripheral vascular disease (HCC)    Sepsis (HCC)     Past Surgical History:  Procedure Laterality Date   ABDOMINAL AORTOGRAM W/LOWER EXTREMITY N/A 10/10/2016   Procedure: Abdominal Aortogram w/Lower Extremity;  Surgeon: Renford Dills, MD;  Location: [facility] IN[facility];  Service: Cardiovascular;  Laterality: N/A;   CATARACT EXTRACTION W/PHACO Left 05/27/2018   Procedure: CATARACT EXTRACTION PHACO AND INTRAOCULAR LENS PLACEMENT (IOC);  Surgeon:  [place] Crane, MD;  Location: [facility] ORS;  Service: Ophthalmology;  Laterality: Left;  [place] 03:06.2 AP% 16.1 CDE 30.16 FLUID PACK LOT @ [id number] H   CIRCUMCISION  1998   EYE SURGERY Left    Retina Detachment   GRAFT APPLICATION Right 05/29/2017   Procedure: GRAFT APPLICATION ( RIGHT FOOT );  Surgeon: Renford Dills, MD;  Location: [facility] ORS;  Service: Vascular;  Laterality: Right;  graft taken from patients right thigh   IRRIGATION AND DEBRIDEMENT FOOT Right 10/03/2016   Procedure: IRRIGATION AND DEBRIDEMENT FOOT;  Surgeon: Gwyneth Revels, DPM;  Location: [facility] ORS;  Service: Podiatry;  Laterality: Right;   LOWER EXTREMITY INTERVENTION  10/10/2016   Procedure: Lower Extremity Intervention;  Surgeon: Renford Dills, MD;  Location: [facility];  Service: Cardiovascular;;   PERIPHERAL VASCULAR BALLOON ANGIOPLASTY Left 10/10/2016   Procedure: Peripheral Vascular Balloon Angioplasty;  Surgeon: Renford Dills, MD;  Location: [facility];  Service: Cardiovascular;  Laterality: Left;   TONSILLECTOMY     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/2022   Procedure: TRANSMETATARSAL AMPUTATION;  Surgeon: Gwyneth Revels, DPM;  Location: [facility] OR[facility];  Service: Podiatry;  Laterality: Right;   WOUND DEBRIDEMENT Right 11/07/2016   Procedure: DEBRIDEMENT OF WOUND AND BONE RIGHT FOOT AND APPLY WOUND VAC;  Surgeon: Gwyneth Revels, DPM;  Location: [facility] OR[facility];  Service: Podiatry;  Laterality: Right;    Social History  Social History   Tobacco Use   Smoking status: Former    Types: Pipe    Quit date: 05/23/1975    Years since quitting: 47.1   Smokeless tobacco: Never  Vaping Use   Vaping Use: Never used  Substance Use Topics   Alcohol use: No   Drug use: No    Family History Family History  Problem Relation Age of Onset   Diabetes Mother    Hypertension Mother    Diabetes Father    Hypertension Father     Allergies  Allergen Reactions   Sulfa Antibiotics Rash   Zosyn [Piperacillin  Sod-Tazobactam So] Rash     REVIEW OF SYSTEMS (Negative unless checked)  Constitutional: [] Weight loss  [] Fever  [] Chills Cardiac: [] Chest pain   [] Chest pressure   [] Palpitations   [] Shortness of breath when laying flat   [] Shortness of breath with exertion. Vascular:  [x] Pain in legs with walking   [] Pain in legs at rest  [] History of DVT   [] Phlebitis   [] Swelling in legs   [] Varicose veins   [] Non-healing ulcers Pulmonary:   [] Uses home oxygen   [] Productive cough   [] Hemoptysis   [] Wheeze  [] COPD   [] Asthma Neurologic:  [] Dizziness   [] Seizures   [] History of stroke   [] History of TIA  [] Aphasia   [] Vissual changes   [] Weakness or numbness in arm   [] Weakness or numbness in leg Musculoskeletal:   [] Joint swelling   [] Joint pain   [] Low back pain Hematologic:  [] Easy bruising  [] Easy bleeding   [] Hypercoagulable state   [] Anemic Gastrointestinal:  [] Diarrhea   [] Vomiting  [] Gastroesophageal reflux/heartburn   [] Difficulty swallowing. Genitourinary:  [] Chronic kidney disease   [] Difficult urination  [] Frequent urination   [] Blood in urine Skin:  [] Rashes   [] Ulcers  Psychological:  [] History of anxiety   []  History of major depression.  Physical Examination  There were no vitals filed for this visit. There is no height or weight on file to calculate BMI. Gen: WD/WN, NAD Head: [place]/AT, No temporalis wasting.  Ear/Nose/Throat: Hearing grossly intact, nares w/o erythema or drainage Eyes: PER, EOMI, sclera nonicteric.  Neck: Supple, no masses.  No bruit or JVD.  Pulmonary:  Good air movement, no audible wheezing, no use of accessory muscles.  Cardiac: RRR, normal S1, S2, no Murmurs. Vascular:  mild trophic changes, no open wounds Vessel Right Left  Radial Palpable Palpable  PT Not Palpable Not Palpable  DP Not Palpable Not Palpable  Gastrointestinal: soft, non-distended. No guarding/no peritoneal signs.  Musculoskeletal: M/S 5/5 throughout.  No visible deformity.  Neurologic: CN 2-12  intact. Pain and light touch intact in extremities.  Symmetrical.  Speech is fluent. Motor exam as listed above. Psychiatric: Judgment intact, Mood & affect appropriate for pt's clinical situation. Dermatologic: No rashes or ulcers noted.  No changes consistent with cellulitis.   CBC Lab Results  Component Value Date   WBC 5.1 01/04/2022   HGB 9.7 (L) 01/04/2022   HCT 29.3 (L) 01/04/2022   MCV 85.9 01/04/2022   PLT 350 01/04/2022    BMET    Component Value Date/Time   NA 134 (L) 01/04/2022 0603   NA 140 04/04/2013 1238   K 5.1 01/04/2022 0603   K 4.1 04/04/2013 1238   CL 109 01/04/2022 0603   CL 108 (H) 04/04/2013 1238   CO2 19 (L) 01/04/2022 0603   CO2 30 04/04/2013 1238   GLUCOSE 131 (H) 01/04/2022 0603   GLUCOSE 98 04/04/2013 1238  BUN 20 01/04/2022 0603   BUN 19 (H) 04/04/2013 1238   CREATININE 1.13 01/07/2022 0317   CREATININE 0.87 04/04/2013 1238   CALCIUM 8.8 (L) 01/04/2022 0603   CALCIUM 8.8 04/04/2013 1238   GFRNONAA >60 01/07/2022 0317   GFRNONAA >60 04/04/2013 1238   GFRAA >60 05/22/2017 0853   GFRAA >60 04/04/2013 1238   CrCl cannot be calculated (Patient's most recent lab result is older than the maximum 21 days allowed.).  COAG Lab Results  Component Value Date   INR 1.1 01/03/2022   INR 2.28 05/22/2017   INR 1.00 11/30/2016    Radiology No results found.   Assessment/Plan There are no diagnoses linked to this encounter.   Levora Dredge, MD  07/14/2022 12:33 PM

## 2023-01-27 IMAGING — CR DG FOOT COMPLETE 3+V*R*
1 series · 3 of 3 positions shown · non-contrast
Comparison: Right foot radiograph dated 10/03/2016.

CLINICAL DATA: Concern for infection.

EXAM:
RIGHT FOOT COMPLETE - 3+ VIEW

[Series 1: dg foot complete right · 0.14mm/px · 3 of 3 slices shown]
[im 1/3]
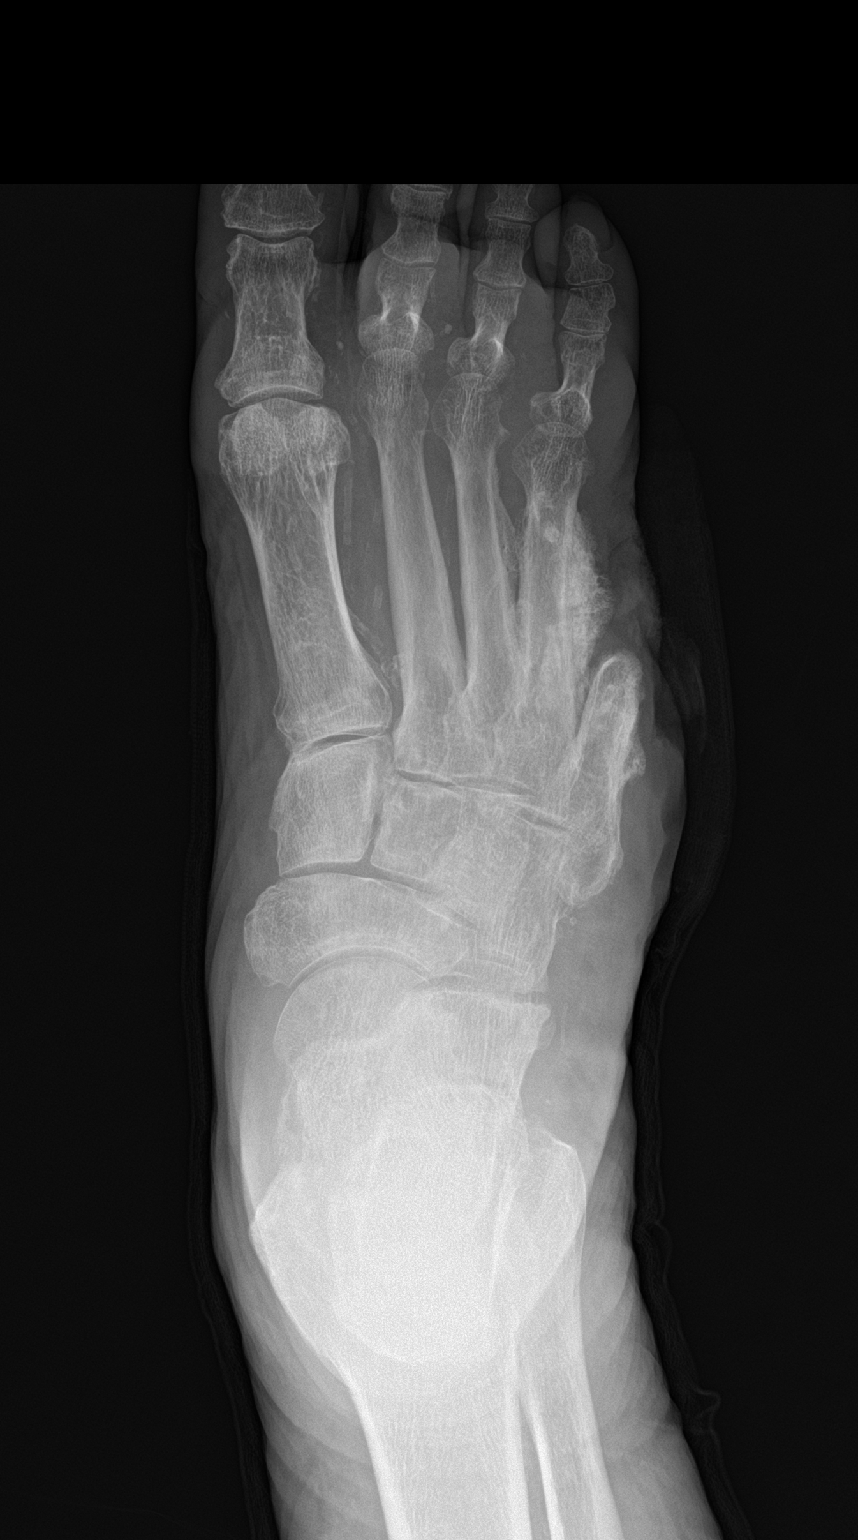
[im 2/3]
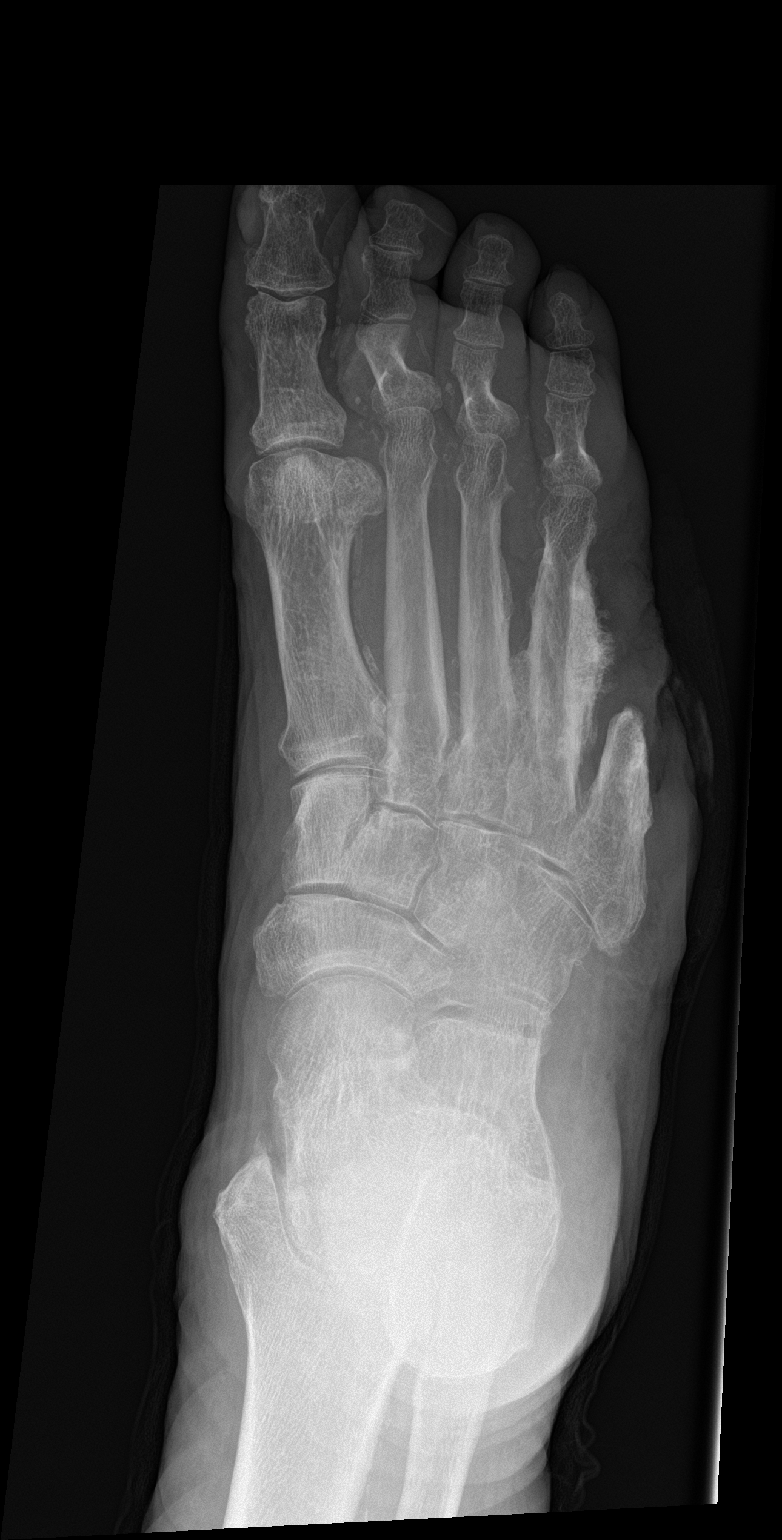
[im 3/3]
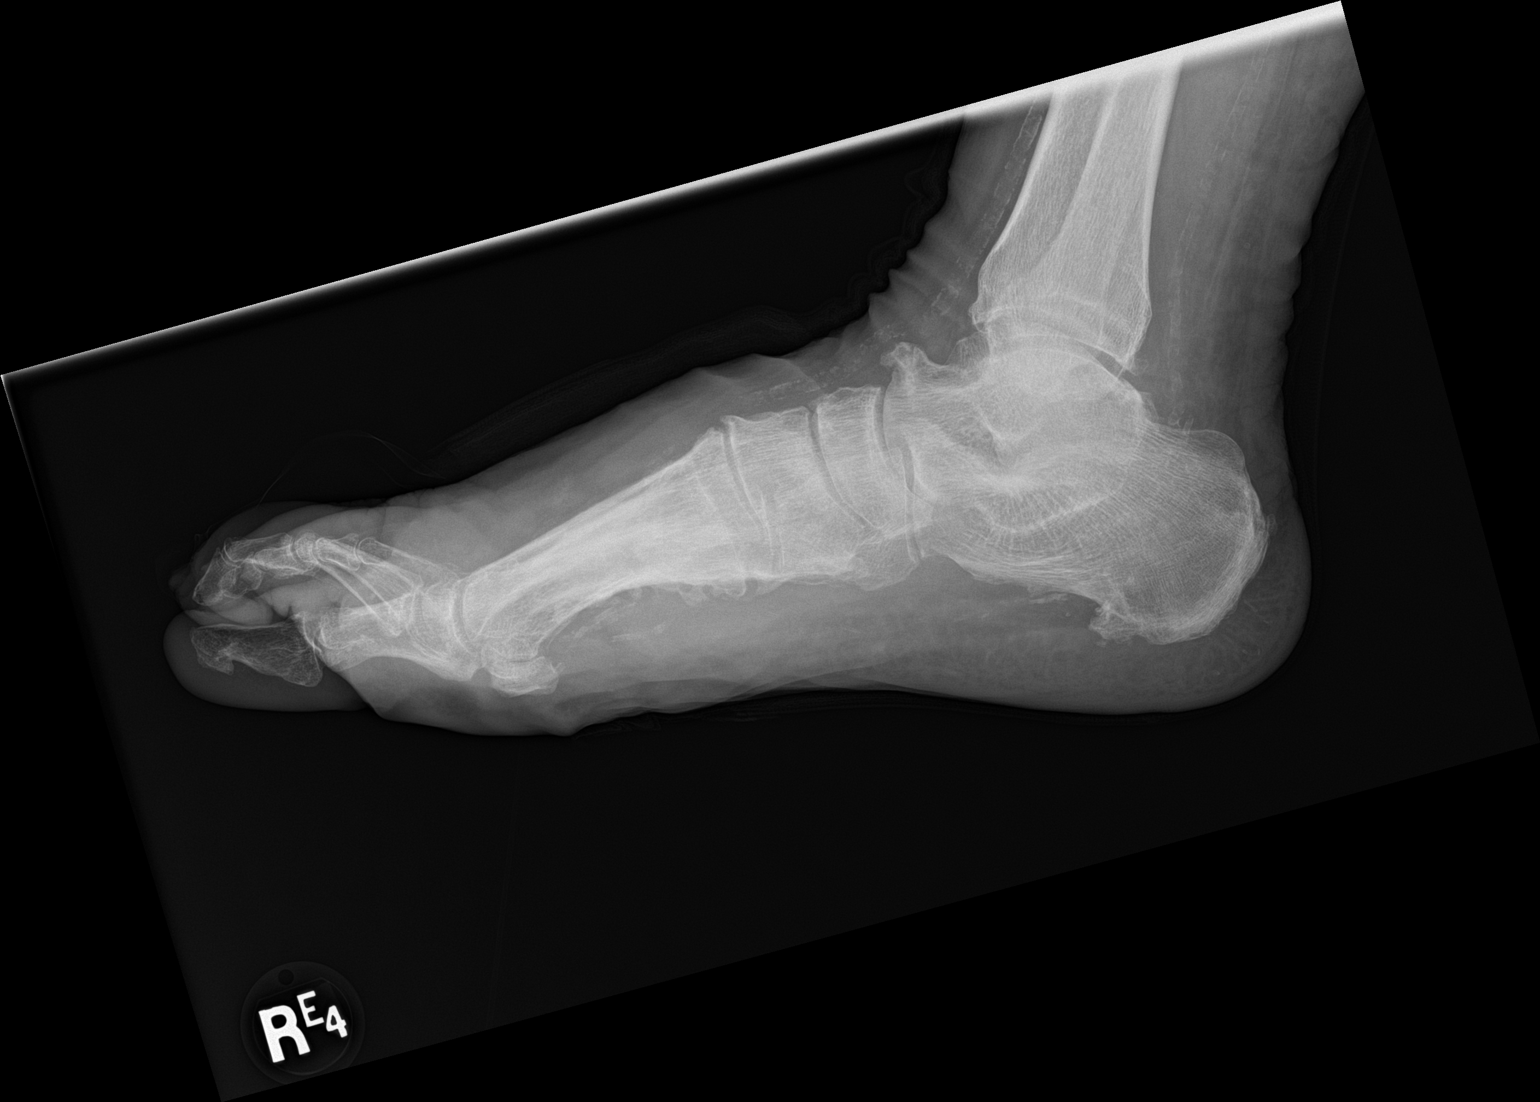

[3 of 3 positions shown; findings below may reference images not displayed]

FINDINGS: Amputation of the proximal fifth metatarsal. There is no acute
fracture or dislocation. The bones are osteopenic. No bone erosion
or periosteal elevation. Chronic periosteal thickening of the fourth
metatarsal. Soft tissue swelling of the foot. Vascular calcification
is noted. No soft tissue gas.
IMPRESSION: 1. No acute fracture or dislocation. No definite radiographic
findings of acute osteomyelitis.
2. Amputation of the proximal fifth metatarsal.
3. Soft tissue swelling of the foot.

## 2023-01-27 IMAGING — CR DG FOOT COMPLETE 3+V*L*
1 series · 3 of 3 positions shown · non-contrast
Comparison: Right foot radiograph dated 05/03/2021.

CLINICAL DATA: Bilateral wound of foot.

EXAM:
LEFT FOOT - COMPLETE 3+ VIEW

[Series 1: dg foot complete left · 0.14mm/px · 3 of 3 slices shown]
[im 1/3]
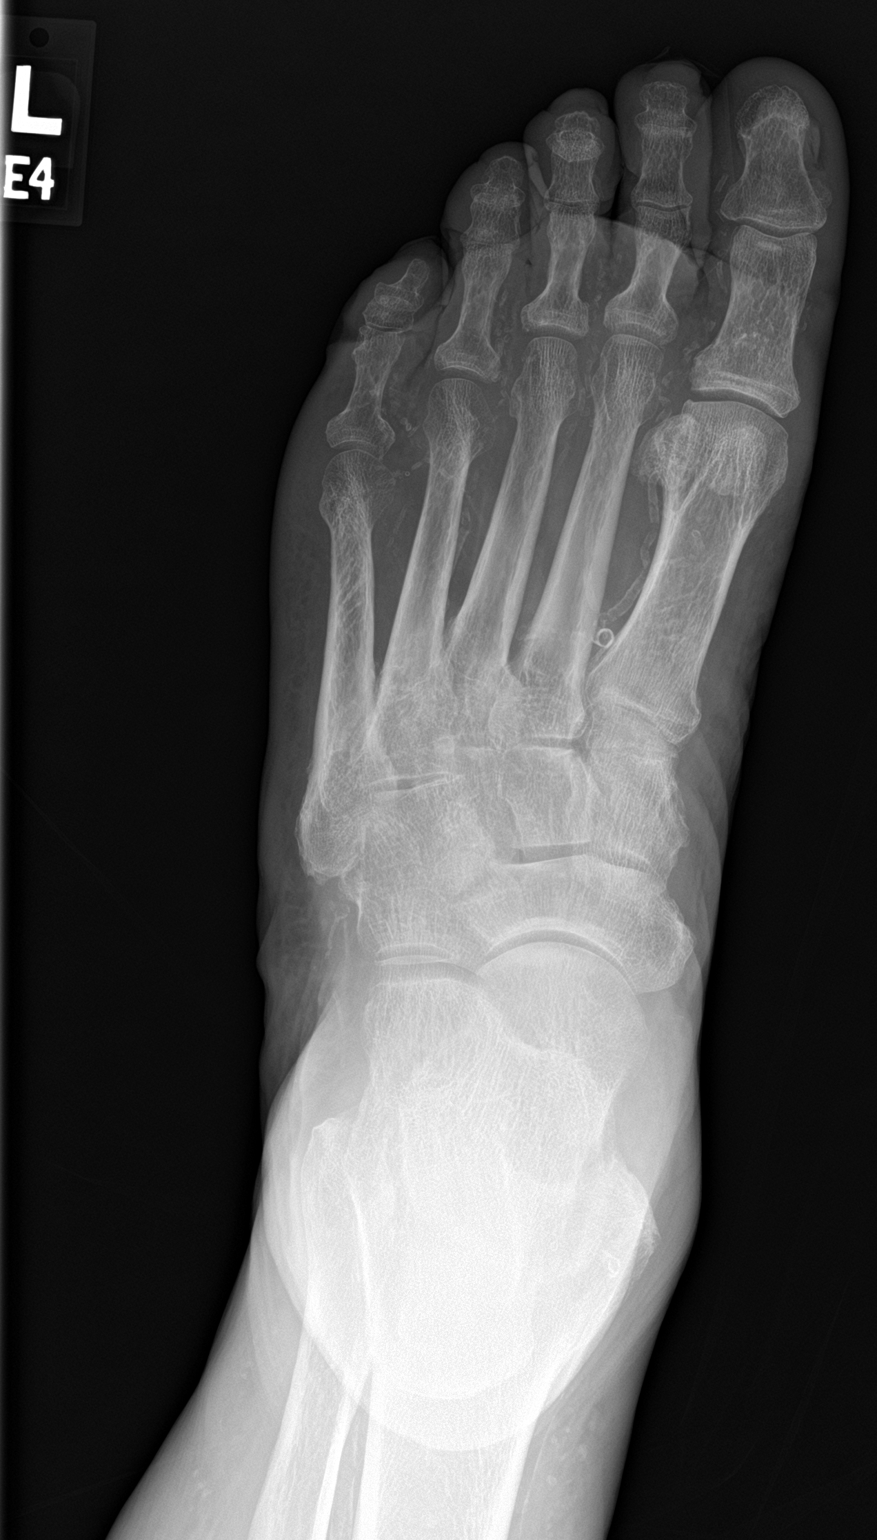
[im 2/3]
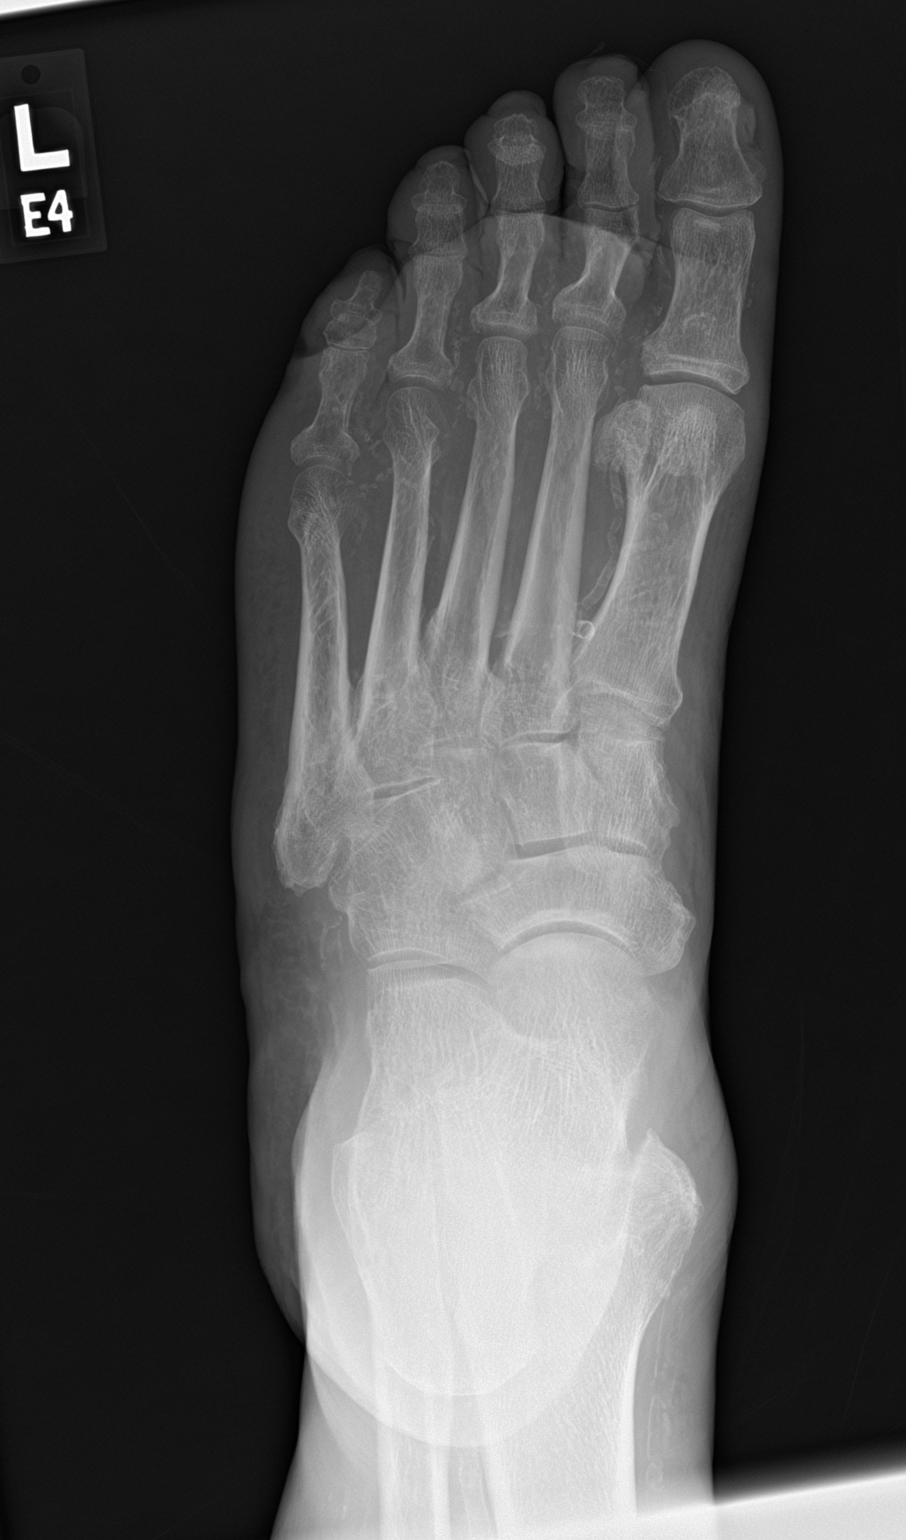
[im 3/3]
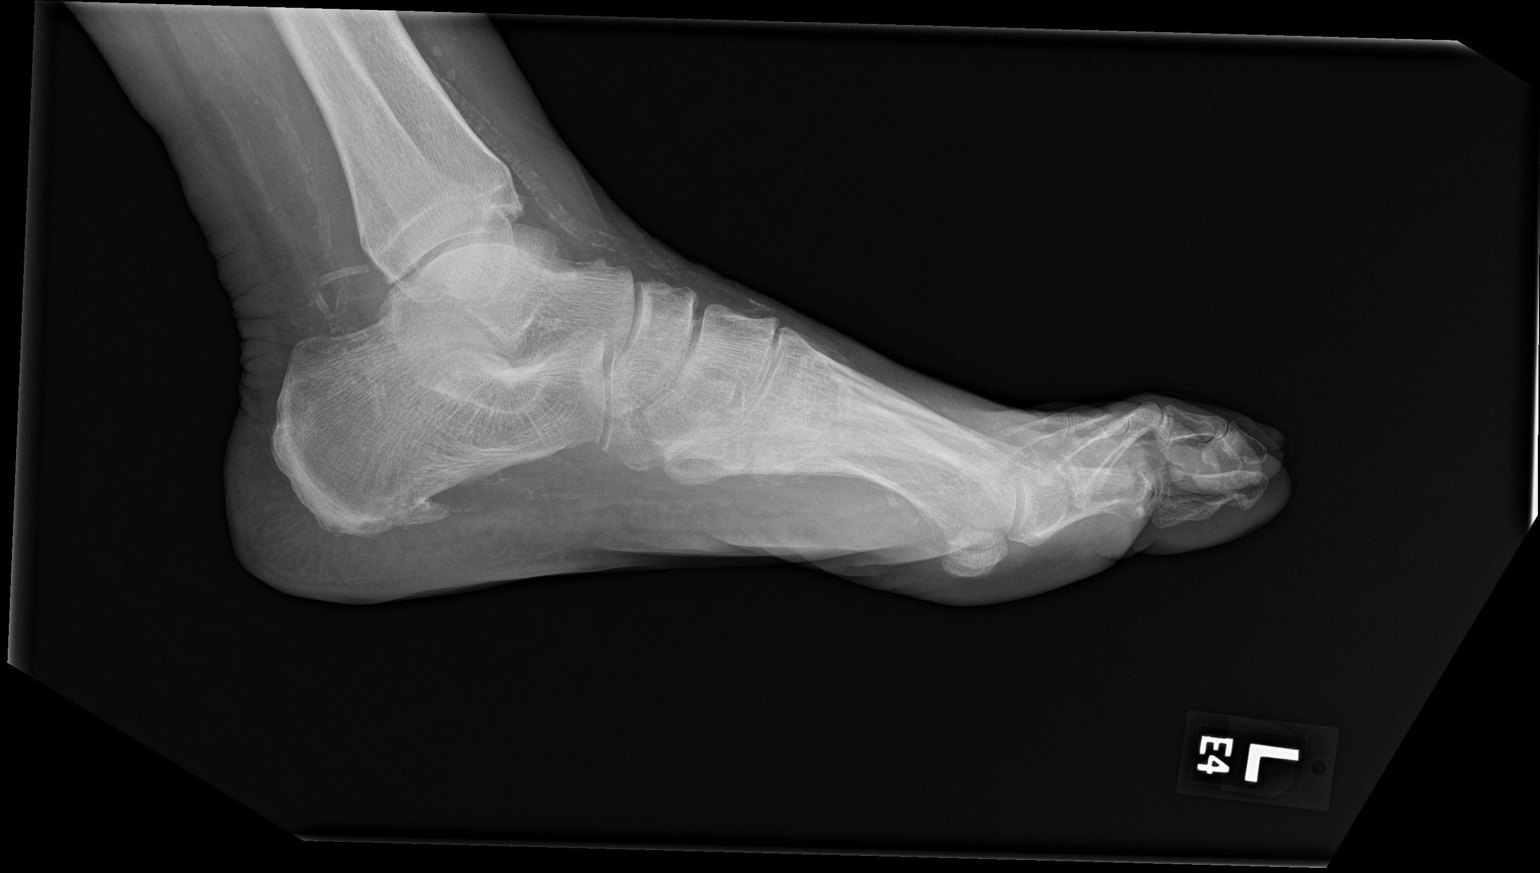

[3 of 3 positions shown; findings below may reference images not displayed]

FINDINGS: There is no acute fracture or dislocation. The bones are osteopenic.
No periosteal elevation or bone erosion. Vascular calcifications
noted. No soft tissue gas.
IMPRESSION: No acute fracture or dislocation. No radiographic evidence of acute
osteomyelitis.

## 2023-02-19 ENCOUNTER — Observation Stay
Admission: EM | Admit: 2023-02-19 | Discharge: 2023-02-24 | Disposition: A | Discharge status: Home / Self Care | Payer: Medicare HMO | Attending: Internal Medicine | Admitting: Internal Medicine

## 2023-02-19 ENCOUNTER — Emergency Department: Payer: Medicare HMO

## 2023-02-19 ENCOUNTER — Other Ambulatory Visit: Payer: Self-pay

## 2023-02-19 DIAGNOSIS — E11628 Type 2 diabetes mellitus with other skin complications: Principal | ICD-10-CM | POA: Diagnosis present

## 2023-02-19 DIAGNOSIS — E1142 Type 2 diabetes mellitus with diabetic polyneuropathy: Secondary | ICD-10-CM | POA: Diagnosis present

## 2023-02-19 DIAGNOSIS — E785 Hyperlipidemia, unspecified: Secondary | ICD-10-CM | POA: Diagnosis present

## 2023-02-19 DIAGNOSIS — I10 Essential (primary) hypertension: Secondary | ICD-10-CM | POA: Diagnosis present

## 2023-02-19 DIAGNOSIS — Z8249 Family history of ischemic heart disease and other diseases of the circulatory system: Secondary | ICD-10-CM | POA: Diagnosis not present

## 2023-02-19 DIAGNOSIS — L03115 Cellulitis of right lower limb: Secondary | ICD-10-CM | POA: Diagnosis present

## 2023-02-19 DIAGNOSIS — L89222 Pressure ulcer of left hip, stage 2: Secondary | ICD-10-CM | POA: Diagnosis present

## 2023-02-19 DIAGNOSIS — E11621 Type 2 diabetes mellitus with foot ulcer: Secondary | ICD-10-CM | POA: Diagnosis present

## 2023-02-19 DIAGNOSIS — Z882 Allergy status to sulfonamides status: Secondary | ICD-10-CM

## 2023-02-19 DIAGNOSIS — I129 Hypertensive chronic kidney disease with stage 1 through stage 4 chronic kidney disease, or unspecified chronic kidney disease: Secondary | ICD-10-CM | POA: Diagnosis not present

## 2023-02-19 DIAGNOSIS — L97519 Non-pressure chronic ulcer of other part of right foot with unspecified severity: Secondary | ICD-10-CM | POA: Diagnosis present

## 2023-02-19 DIAGNOSIS — L97929 Non-pressure chronic ulcer of unspecified part of left lower leg with unspecified severity: Secondary | ICD-10-CM | POA: Diagnosis present

## 2023-02-19 DIAGNOSIS — Z833 Family history of diabetes mellitus: Secondary | ICD-10-CM

## 2023-02-19 DIAGNOSIS — Z89431 Acquired absence of right foot: Secondary | ICD-10-CM | POA: Diagnosis not present

## 2023-02-19 DIAGNOSIS — Z6839 Body mass index (BMI) 39.0-39.9, adult: Secondary | ICD-10-CM | POA: Diagnosis not present

## 2023-02-19 DIAGNOSIS — F0390 Unspecified dementia without behavioral disturbance: Secondary | ICD-10-CM | POA: Diagnosis present

## 2023-02-19 DIAGNOSIS — I70202 Unspecified atherosclerosis of native arteries of extremities, left leg: Secondary | ICD-10-CM | POA: Diagnosis not present

## 2023-02-19 DIAGNOSIS — Z794 Long term (current) use of insulin: Secondary | ICD-10-CM | POA: Diagnosis not present

## 2023-02-19 DIAGNOSIS — I872 Venous insufficiency (chronic) (peripheral): Secondary | ICD-10-CM | POA: Diagnosis present

## 2023-02-19 DIAGNOSIS — L89312 Pressure ulcer of right buttock, stage 2: Secondary | ICD-10-CM | POA: Diagnosis present

## 2023-02-19 DIAGNOSIS — L89322 Pressure ulcer of left buttock, stage 2: Secondary | ICD-10-CM | POA: Diagnosis present

## 2023-02-19 DIAGNOSIS — I70235 Atherosclerosis of native arteries of right leg with ulceration of other part of foot: Secondary | ICD-10-CM | POA: Diagnosis not present

## 2023-02-19 DIAGNOSIS — N183 Chronic kidney disease, stage 3 unspecified: Secondary | ICD-10-CM | POA: Diagnosis present

## 2023-02-19 DIAGNOSIS — E669 Obesity, unspecified: Secondary | ICD-10-CM | POA: Diagnosis not present

## 2023-02-19 DIAGNOSIS — E1122 Type 2 diabetes mellitus with diabetic chronic kidney disease: Secondary | ICD-10-CM | POA: Diagnosis not present

## 2023-02-19 DIAGNOSIS — E7800 Pure hypercholesterolemia, unspecified: Secondary | ICD-10-CM | POA: Diagnosis present

## 2023-02-19 DIAGNOSIS — E1151 Type 2 diabetes mellitus with diabetic peripheral angiopathy without gangrene: Secondary | ICD-10-CM | POA: Diagnosis present

## 2023-02-19 DIAGNOSIS — E872 Acidosis, unspecified: Secondary | ICD-10-CM | POA: Diagnosis present

## 2023-02-19 DIAGNOSIS — L089 Local infection of the skin and subcutaneous tissue, unspecified: Secondary | ICD-10-CM | POA: Diagnosis present

## 2023-02-19 DIAGNOSIS — Z881 Allergy status to other antibiotic agents status: Secondary | ICD-10-CM

## 2023-02-19 DIAGNOSIS — Z79899 Other long term (current) drug therapy: Secondary | ICD-10-CM | POA: Diagnosis not present

## 2023-02-19 DIAGNOSIS — I708 Atherosclerosis of other arteries: Secondary | ICD-10-CM | POA: Diagnosis not present

## 2023-02-19 DIAGNOSIS — Z86718 Personal history of other venous thrombosis and embolism: Secondary | ICD-10-CM

## 2023-02-19 DIAGNOSIS — L89212 Pressure ulcer of right hip, stage 2: Secondary | ICD-10-CM | POA: Diagnosis present

## 2023-02-19 DIAGNOSIS — Z7984 Long term (current) use of oral hypoglycemic drugs: Secondary | ICD-10-CM

## 2023-02-19 DIAGNOSIS — Z87891 Personal history of nicotine dependence: Secondary | ICD-10-CM | POA: Diagnosis not present

## 2023-02-19 LAB — CBC WITH DIFFERENTIAL/PLATELET
Abs Immature Granulocytes: 0.01 10*3/uL (ref 0.00–0.07)
Basophils Absolute: 0 10*3/uL (ref 0.0–0.1)
Basophils Relative: 1 %
Eosinophils Absolute: 0.2 10*3/uL (ref 0.0–0.5)
Eosinophils Relative: 3 %
HCT: 36.1 % — ABNORMAL LOW (ref 39.0–52.0)
Hemoglobin: 11.7 g/dL — ABNORMAL LOW (ref 13.0–17.0)
Immature Granulocytes: 0 %
Lymphocytes Relative: 22 %
Lymphs Abs: 1.2 10*3/uL (ref 0.7–4.0)
MCH: 29.6 pg (ref 26.0–34.0)
MCHC: 32.4 g/dL (ref 30.0–36.0)
MCV: 91.4 fL (ref 80.0–100.0)
Monocytes Absolute: 0.8 10*3/uL (ref 0.1–1.0)
Monocytes Relative: 16 %
Neutro Abs: 3.1 10*3/uL (ref 1.7–7.7)
Neutrophils Relative %: 58 %
Platelets: 332 10*3/uL (ref 150–400)
RBC: 3.95 MIL/uL — ABNORMAL LOW (ref 4.22–5.81)
RDW: 14.9 % (ref 11.5–15.5)
WBC: 5.3 10*3/uL (ref 4.0–10.5)
nRBC: 0 % (ref 0.0–0.2)

## 2023-02-19 LAB — BASIC METABOLIC PANEL
Anion gap: 8 (ref 5–15)
BUN: 17 mg/dL (ref 8–23)
CO2: 24 mmol/L (ref 22–32)
Calcium: 9.2 mg/dL (ref 8.9–10.3)
Chloride: 106 mmol/L (ref 98–111)
Creatinine, Ser: 1.06 mg/dL (ref 0.61–1.24)
GFR, Estimated: >60 mL/min (ref >60)
Glucose, Bld: 120 mg/dL — ABNORMAL HIGH (ref 70–99)
Potassium: 4.8 mmol/L (ref 3.5–5.1)
Sodium: 138 mmol/L (ref 135–145)

## 2023-02-19 LAB — CBG MONITORING, ED: Glucose-Capillary: 127 mg/dL — ABNORMAL HIGH (ref 70–99)

## 2023-02-19 LAB — GLUCOSE, CAPILLARY: Glucose-Capillary: 175 mg/dL — ABNORMAL HIGH (ref 70–99)

## 2023-02-19 LAB — LACTIC ACID, PLASMA
Lactic Acid, Venous: 2.1 mmol/L (ref 0.5–1.9)
Lactic Acid, Venous: 1.4 mmol/L (ref 0.5–1.9)

## 2023-02-19 LAB — HEMOGLOBIN A1C
Hgb A1c MFr Bld: 6.4 % — ABNORMAL HIGH (ref 4.8–5.6)
Mean Plasma Glucose: 136.98 mg/dL

## 2023-02-19 MED ORDER — VANCOMYCIN HCL 2000 MG/400ML IV SOLN
2000.0000 mg | Freq: Once | INTRAVENOUS | Status: AC
Start: 2023-02-19 — End: 2023-02-20
  Administered 2023-02-19: 2000 mg via INTRAVENOUS
  Filled 2023-02-19 (×3): qty 400

## 2023-02-19 MED ORDER — SODIUM CHLORIDE 0.9 % IV BOLUS
1000.0000 mL | Freq: Once | INTRAVENOUS | Status: AC
  Administered 2023-02-19: 1000 mL via INTRAVENOUS

## 2023-02-19 MED ORDER — ACETAMINOPHEN 325 MG PO TABS
650.0000 mg | ORAL_TABLET | Freq: Four times a day (QID) | ORAL | Status: DC | PRN
  Administered 2023-02-19: 650 mg via ORAL
  Filled 2023-02-19: qty 2

## 2023-02-19 MED ORDER — HEPARIN SODIUM (PORCINE) 5000 UNIT/ML IJ SOLN
5000.0000 [IU] | Freq: Three times a day (TID) | INTRAMUSCULAR | Status: DC
  Administered 2023-02-19 – 2023-02-24 (×15): 5000 [IU] via SUBCUTANEOUS
  Filled 2023-02-19 (×15): qty 1

## 2023-02-19 MED ORDER — INSULIN GLARGINE-YFGN 100 UNIT/ML ~~LOC~~ SOLN
15.0000 [IU] | Freq: Every day | SUBCUTANEOUS | Status: DC
  Administered 2023-02-20 – 2023-02-23 (×5): 15 [IU] via SUBCUTANEOUS
  Filled 2023-02-19 (×7): qty 0.15

## 2023-02-19 MED ORDER — VANCOMYCIN HCL IN DEXTROSE 1-5 GM/200ML-% IV SOLN
1000.0000 mg | Freq: Once | INTRAVENOUS | Status: DC
  Filled 2023-02-19: qty 200

## 2023-02-19 MED ORDER — INSULIN ASPART 100 UNIT/ML IJ SOLN
0.0000 [IU] | Freq: Three times a day (TID) | INTRAMUSCULAR | Status: DC
  Administered 2023-02-20: 2 [IU] via SUBCUTANEOUS
  Administered 2023-02-20: 3 [IU] via SUBCUTANEOUS
  Administered 2023-02-21: 1 [IU] via SUBCUTANEOUS
  Administered 2023-02-22: 2 [IU] via SUBCUTANEOUS
  Administered 2023-02-23 (×2): 1 [IU] via SUBCUTANEOUS
  Administered 2023-02-23: 2 [IU] via SUBCUTANEOUS
  Administered 2023-02-24: 1 [IU] via SUBCUTANEOUS
  Administered 2023-02-24: 2 [IU] via SUBCUTANEOUS
  Filled 2023-02-19 (×9): qty 1

## 2023-02-19 MED ORDER — POLYETHYLENE GLYCOL 3350 17 G PO PACK
17.0000 g | PACK | Freq: Every day | ORAL | Status: DC | PRN
  Administered 2023-02-21 – 2023-02-23 (×3): 17 g via ORAL
  Filled 2023-02-19 (×3): qty 1

## 2023-02-19 MED ORDER — CEFEPIME HCL 1 G IJ SOLR
1.0000 g | Freq: Once | INTRAMUSCULAR | Status: DC
  Filled 2023-02-19: qty 10

## 2023-02-19 MED ORDER — VANCOMYCIN HCL 2000 MG/400ML IV SOLN: 2000.0000 mg | INTRAVENOUS | Status: DC

## 2023-02-19 MED ORDER — INSULIN ASPART 100 UNIT/ML IJ SOLN
10.0000 [IU] | Freq: Three times a day (TID) | INTRAMUSCULAR | Status: DC
  Administered 2023-02-20 – 2023-02-24 (×12): 10 [IU] via SUBCUTANEOUS
  Filled 2023-02-19 (×13): qty 1

## 2023-02-19 MED ORDER — SODIUM CHLORIDE 0.9 % IV SOLN
2.0000 g | Freq: Three times a day (TID) | INTRAVENOUS | Status: DC
  Administered 2023-02-19 – 2023-02-22 (×8): 2 g via INTRAVENOUS
  Filled 2023-02-19 (×9): qty 12.5

## 2023-02-19 MED ORDER — ACETAMINOPHEN 650 MG RE SUPP: 650.0000 mg | Freq: Four times a day (QID) | RECTAL | Status: DC | PRN

## 2023-02-19 NOTE — Consult Note (Signed)
Pharmacy Antibiotic Note  [name] is a 72 y.o. male admitted on 02/19/2023 with  DFI / OM .  Pharmacy has been consulted for vancomycin and cefepime dosing.  Plan:  Cefepime 2 g IV q8h  Vancomycin 2 g IV q24h --Calculated AUC: 515, Cmin: 10.8 --Daily Scr per protocol --Levels at steady state or as clinically indicated  Height: 5\' 11"  (180.3 cm) Weight: 129.3 kg (285 lb) IBW/kg (Calculated) : 75.3  Temp (24hrs), Avg:97.8 F (36.6 C), Min:97.8 F (36.6 C), Max:97.8 F (36.6 C)  Recent Labs  Lab 02/19/23 1327 02/19/23 1328  WBC 5.3  --   CREATININE 1.06  --   LATICACIDVEN  --  2.1*    Estimated Creatinine Clearance: 86.3 mL/min (by C-G formula based on SCr of 1.06 mg/dL).    Allergies  Allergen Reactions   Sulfa Antibiotics Rash   Zosyn [Piperacillin Sod-Tazobactam So] Rash    Antimicrobials this admission: Cefepime 6/20 >>  Vancomycin 6/20 >>   Dose adjustments this admission: N/A  Microbiology results: 6/20 BCx: pending  Thank you for allowing pharmacy to be a part of this patient's care.  Tressie Ellis 02/19/2023 5:47 PM

## 2023-02-19 NOTE — ED Provider Notes (Signed)
[facility] Provider Note    Event Date/Time   First MD Initiated Contact with Patient 02/19/23 1424     (approximate)   History   Chief Complaint Constipation and Skin Ulcer   HPI  [name] is a 72 y.o. male with past medical history of hypertension, hyperlipidemia, diabetes, CKD, PAD, and DVT who presents to the ED complaining of skin ulcer.  Patient reports that he had surgery to remove his toes on his right foot about 1 year ago, has had a wound to the side of his foot that has not completely healed since then.  He states that a few days ago he began to notice increasing pain around this area with a small amount of foul-smelling drainage.  He has still been able to bear weight on the foot and denies any new injuries.  He reports a small amount of swelling but denies any fevers.  He spoke with his podiatrist, who scheduled an appointment for next week, but when he presented to his PCP, they referred him to the ED.  Patient also reports constipation for the past 4 days, denies abdominal pain, nausea, or vomiting.     Physical Exam   Triage Vital Signs: ED Triage Vitals  Enc Vitals Group     BP 02/19/23 1325 118/66     Pulse Rate 02/19/23 1325 86     Resp 02/19/23 1325 16     Temp 02/19/23 1325 97.8 F (36.6 C)     Temp Source 02/19/23 1324 Oral     SpO2 02/19/23 1325 94 %     Weight 02/19/23 1324 285 lb (129.3 kg)     Height 02/19/23 1324 5\' 11"  (1.803 m)     Head Circumference --      Peak Flow --      Pain Score 02/19/23 1324 1     Pain Loc --      Pain Edu? --      Excl. in GC? --     Most recent vital signs: Vitals:   02/19/23 1325  BP: 118/66  Pulse: 86  Resp: 16  Temp: 97.8 F (36.6 C)  SpO2: 94%    Constitutional: Alert and oriented. Eyes: Conjunctivae are normal. Head: Atraumatic. Nose: No congestion/rhinnorhea. Mouth/Throat: Mucous membranes are moist.  Cardiovascular: Normal rate, regular rhythm. Grossly normal  heart sounds.  2+ radial pulses, 1+ DP pulses bilaterally. Respiratory: Normal respiratory effort.  No retractions. Lungs CTAB. Gastrointestinal: Soft and nontender. No distention. Musculoskeletal: Status post TMA on right with small nonhealing wound to the right side of his foot.  Mild erythema, edema, and warmth noted without significant tenderness or purulent drainage.  See picture below. Neurologic:  Normal speech and language. No gross focal neurologic deficits are appreciated.     ED Results / Procedures / Treatments   Labs (all labs ordered are listed, but only abnormal results are displayed) Labs Reviewed  CBC WITH DIFFERENTIAL/PLATELET - Abnormal; Notable for the following components:      Result Value   RBC 3.95 (*)    Hemoglobin 11.7 (*)    HCT 36.1 (*)    All other components within normal limits  BASIC METABOLIC PANEL - Abnormal; Notable for the following components:   Glucose, Bld 120 (*)    All other components within normal limits  LACTIC ACID, PLASMA - Abnormal; Notable for the following components:   Lactic Acid, Venous 2.1 (*)    All other components within normal  limits  CBG MONITORING, ED - Abnormal; Notable for the following components:   Glucose-Capillary 127 (*)    All other components within normal limits  CULTURE, BLOOD (ROUTINE X 2)  CULTURE, BLOOD (ROUTINE X 2)  LACTIC ACID, PLASMA    RADIOLOGY Foot x-ray reviewed and interpreted by me with bony cortical breakdown concerning for osteomyelitis.  PROCEDURES:  Critical Care performed: No  Procedures   MEDICATIONS ORDERED IN ED: Medications  ceFEPIme (MAXIPIME) 1 g in sodium chloride 0.9 % 100 mL IVPB (has no administration in time range)  vancomycin (VANCOCIN) IVPB 1000 mg/200 mL premix (has no administration in time range)  sodium chloride 0.9 % bolus 1,000 mL (1,000 mLs Intravenous New Bag/Given 02/19/23 1611)     IMPRESSION / MDM / ASSESSMENT AND PLAN / ED COURSE  I reviewed the triage  vital signs and the nursing notes.                              72 y.o. male with past medical history of hypertension, hyperlipidemia, diabetes, CKD, PAD, and DVT who presents to the ED with right foot pain and swelling with nonhealing wound for the past 4 days.  Also reports constipation for 4 days.  Patient's presentation is most consistent with acute presentation with potential threat to life or bodily function.  Differential diagnosis includes, but is not limited to, diabetic foot infection, abscess, cellulitis, osteomyelitis, constipation, bowel obstruction.  Patient well-appearing and in no acute distress, vital signs are unremarkable and do not appear concerning for sepsis.  He does have nonhealing ulceration to his right foot with small amount of erythema, edema, and warmth.  This does appear concerning for developing infection although no evidence of abscess on exam.  We will check x-ray for evidence of osteomyelitis but suspicion currently low for osteomyelitis.  Labs are reassuring with no significant anemia, leukocytosis, lecture abnormality, or AKI.  Patient does have mildly elevated lactic acid, but low suspicion for sepsis given reassuring vital signs with no leukocytosis.  Foot x-rays concerning for developing osteomyelitis and patient would benefit from admission for IV antibiotics as well as further assessment with MRI.  Case discussed with hospitalist for admission.      FINAL CLINICAL IMPRESSION(S) / ED DIAGNOSES   Final diagnoses:  Diabetic foot infection (HCC)     Rx / DC Orders   ED Discharge Orders     None        Note:  This document was prepared using Dragon voice recognition software and may include unintentional dictation errors.   Chesley Noon, MD 02/19/23 1723

## 2023-02-19 NOTE — ED Triage Notes (Signed)
Pt c/o constipation with incomplete bm x4 days. Pt also c/o right diabetic foot ulcer that is infected.

## 2023-02-19 NOTE — H&P (Addendum)
History and Physical:    [name]   ZOX:[id number] DOB: 06-Oct-1950 DOA: 02/19/2023  Referring MD/provider: Chesley Noon, MD PCP: Abram Sander, MD   Patient coming from: Home  Chief Complaint: Wound infection  History of Present Illness:   [name] is a 72 y.o. 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  He said he has had this ulcer since he has right transmetatarsal amputation in May 2023.  About 2 weeks ago, he noticed a foul smell from the right foot ulcer.  He also noticed that there was drainage from the ulcer.  He could not tell the color of the drainage because he said he cannot see the plantar aspect/bottom of his feet.  He noticed increased burning sensation on the right foot as well.  He was concerned about losing his foot so he presented to the emergency department for further management.  He reported diarrhea about 2 weeks ago and this was followed by constipation few days later.  ED Course:  The patient was given 1 L of normal saline in the emergency department.  ROS:   ROS all other systems reviewed were negative.  Past Medical History:   Past Medical History:  Diagnosis Date   Arthritis    Bladder incontinence    Chronic kidney disease    Stage 3 Kidney disease   Dementia (HCC)    Diabetes mellitus without complication (HCC)    DVT of leg (deep venous thrombosis) (HCC)    H/O RIGHT LEG 2018   Dyspnea    occassional   Hyperlipidemia    Hypertension    Peripheral vascular disease (HCC)    Sepsis (HCC)     Past Surgical History:   Past Surgical History:  Procedure  Laterality Date   ABDOMINAL AORTOGRAM W/LOWER EXTREMITY N/A 10/10/2016   Procedure: Abdominal Aortogram w/Lower Extremity;  Surgeon: Renford Dills, MD;  Location: [facility];  Service: Cardiovascular;  Laterality: N/A;   CATARACT EXTRACTION W/PHACO Left 05/27/2018   Procedure: CATARACT EXTRACTION PHACO AND INTRAOCULAR LENS PLACEMENT (IOC);  Surgeon: Nevada Crane, MD;  Location: [facility];  Service: Ophthalmology;  Laterality: Left;  [place] 03:06.2 AP% 16.1 CDE 30.16 FLUID PACK LOT @ [id number] H   CIRCUMCISION  1998   EYE SURGERY Left    Retina Detachment   GRAFT APPLICATION Right 05/29/2017   Procedure: GRAFT APPLICATION ( RIGHT FOOT );  Surgeon: Renford Dills, MD;  Location: [facility];  Service: Vascular;  Laterality: Right;  graft taken from patients right thigh   IRRIGATION AND DEBRIDEMENT FOOT Right 10/03/2016   Procedure: IRRIGATION AND DEBRIDEMENT FOOT;  Surgeon: Gwyneth Revels, DPM;  Location: [facility];  Service: Podiatry;  Laterality: Right;   LOWER EXTREMITY INTERVENTION  10/10/2016   Procedure: Lower Extremity Intervention;  Surgeon: Renford Dills, MD;  Location: [facility];  Service: Cardiovascular;;   PERIPHERAL VASCULAR BALLOON ANGIOPLASTY Left 10/10/2016   Procedure: Peripheral Vascular Balloon Angioplasty;  Surgeon: Renford Dills, MD;  Location: [facility];  Service: Cardiovascular;  Laterality: Left;   TONSILLECTOMY     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/2022   Procedure: TRANSMETATARSAL AMPUTATION;  Surgeon: Gwyneth Revels, DPM;  Location: [facility];  Service: Podiatry;  Laterality: Right;   WOUND DEBRIDEMENT Right 11/07/2016   Procedure: DEBRIDEMENT OF WOUND AND BONE RIGHT FOOT AND APPLY WOUND VAC;  Surgeon: Gwyneth Revels, DPM;  Location: [facility];  Service: Podiatry;  Laterality: Right;    Social History:   Social History   Socioeconomic History   Marital status: Single    Spouse name: Not on file   Number of children: Not on file    Years of education: Not on file   Highest education level: Not on file  Occupational History   Not on file  Tobacco Use   Smoking status: Former    Types: Pipe    Quit date: 05/23/1975    Years since quitting: 47.7   Smokeless tobacco: Never  Vaping Use   Vaping Use: Never used  Substance and Sexual Activity   Alcohol use: No   Drug use: No   Sexual activity: Not on file  Other Topics Concern   Not on file  Social History Narrative   Not on file   Social Determinants of Health   Financial Resource Strain: Not on file  Food Insecurity: Not on file  Transportation Needs: Not on file  Physical Activity: Not on file  Stress: Not on file  Social Connections: Not on file  Intimate Partner Violence: Not on file    Allergies   Sulfa antibiotics and Zosyn [piperacillin sod-tazobactam so]  Family history:   Family History  Problem Relation Age of Onset   Diabetes Mother    Hypertension Mother    Diabetes Father    Hypertension Father     Current Medications:   Prior to Admission medications   Medication Sig Start Date End Date Taking? Authorizing Provider  ACCU-CHEK GUIDE test strip Use 1 strip via meter four times a day  to monitor blood glucose 07/10/20   [provider]  Accu-Chek Softclix Lancets lancets  04/29/20   [provider]  acetaminophen (TYLENOL) 325 MG tablet Take 650 mg by mouth every 6 (six) hours as needed.    [provider]  atorvastatin (LIPITOR) 10 MG tablet TAKE 1 TABLET  EACH  EVENING FOR HIGH CHOLESTEROL 06/12/20   [provider]  docusate sodium (COLACE) 100 MG capsule Take 1 capsule (100 mg total) by mouth 2 (two) times daily. 10/13/16   Enid Baas, MD  DROPLET INSULIN SYRINGE 31G X 5/16" 0.3 ML MISC USE FIVE TIMES DAILY AS DIRECTED 06/12/20   [provider]  FEROSUL 325 (65 Fe) MG tablet Take 325 mg by mouth 2 (two) times daily with a meal. 12/26/21   [provider]  glucose blood  (ACCU-CHEK AVIVA PLUS) test strip  04/29/20   [provider]  insulin glargine (LANTUS) 100 UNIT/ML injection Inject 0.15 mLs (15 Units total) into the skin at bedtime. 10/13/16   Enid Baas, MD  insulin lispro (HUMALOG) 100 UNIT/ML injection Inject 4 Units into the skin See admin instructions. Inject 4 units SQ with breakfast, inject 4 units SQ with lunch and inject 4 units SQ with dinner    [provider]  Insulin Syringe-Needle U-100 31G X 5/16" 0.3 ML MISC  06/05/20   [provider]  lisinopril (ZESTRIL) 5 MG tablet Take 1 tablet (5 mg total) by mouth daily. 01/08/22   Enedina Finner, MD  metFORMIN (GLUCOPHAGE) 1000 MG tablet TAKE 1 TABLET TWICE DAILY FOR DIABETES 06/12/20   [provider]  Multiple Vitamins-Minerals (CENTROVITE) TABS Take 1 tablet by mouth daily.  [provider]  polyethylene glycol (MIRALAX / GLYCOLAX) packet Take 17 g by mouth daily.    [provider]  senna (SENOKOT) 8.6 MG TABS tablet Take 1 tablet by mouth 2 (two) times daily.    [provider]  Simethicone (GAS-X ULTRA STRENGTH PO) Take 1 capsule by mouth 4 (four) times daily as needed (for flatulence).    [provider]    Physical Exam:   Vitals:   02/19/23 1324 02/19/23 1325  BP:  118/66  Pulse:  86  Resp:  16  Temp:  97.8 F (36.6 C)  TempSrc: Oral Oral  SpO2:  94%  Weight: 129.3 kg   Height: 5\' 11"  (1.803 m)      Physical Exam: Blood pressure 118/66, pulse 86, temperature 97.8 F (36.6 C), temperature source Oral, resp. rate 16, height 5\' 11"  (1.803 m), weight 129.3 kg, SpO2 94 %. Gen: No acute distress. Head: Normocephalic, atraumatic. Eyes: Pupils equal, round and reactive to light. Extraocular movements intact.  Sclerae nonicteric.  Mouth: Moist mucous membranes.  No pharyngeal exudates or erythema Neck: Supple, no thyromegaly, no lymphadenopathy, no jugular venous distention. Chest: Lungs are clear to  auscultation with good air movement. No rales, rhonchi or wheezes.  CV: Heart sounds are regular with an S1, S2. No murmurs, rubs or gallops.  Abdomen: Soft, nontender, obese with normal active bowel sounds. No palpable masses. Extremities: Extremities are without clubbing, or cyanosis.  Unable to palpate pedal pulses at this time.  Right transmetatarsal amputee. Skin: Warm and dry.  Chronic erythematous changes on bilateral legs and feet.  Open wounds on the lateral border of the right foot.  No drainage from wounds noted at this time. Neuro: Alert and oriented times 3; grossly nonfocal.  Psych: Insight is good and judgment is appropriate. Mood and affect normal.            Data Review:    Labs: Basic Metabolic Panel: Recent Labs  Lab 02/19/23 1327  NA 138  K 4.8  CL 106  CO2 24  GLUCOSE 120*  BUN 17  CREATININE 1.06  CALCIUM 9.2   Liver Function Tests: No results for input(s): "AST", "ALT", "ALKPHOS", "BILITOT", "PROT", "ALBUMIN" in the last 168 hours. No results for input(s): "LIPASE", "AMYLASE" in the last 168 hours. No results for input(s): "AMMONIA" in the last 168 hours. CBC: Recent Labs  Lab 02/19/23 1327  WBC 5.3  NEUTROABS 3.1  HGB 11.7*  HCT 36.1*  MCV 91.4  PLT 332   Cardiac Enzymes: No results for input(s): "CKTOTAL", "CKMB", "CKMBINDEX", "TROPONINI" in the last 168 hours.  BNP (last 3 results) No results for input(s): "PROBNP" in the last 8760 hours. CBG: Recent Labs  Lab 02/19/23 1534  GLUCAP 127*    Urinalysis    Component Value Date/Time   COLORURINE AMBER (A) 10/03/2016 1342   APPEARANCEUR TURBID (A) 10/03/2016 1342   LABSPEC 1.026 10/03/2016 1342   PHURINE 5.0 10/03/2016 1342   GLUCOSEU 50 (A) 10/03/2016 1342   HGBUR SMALL (A) 10/03/2016 1342   BILIRUBINUR NEGATIVE 10/03/2016 1342   KETONESUR NEGATIVE 10/03/2016 1342   PROTEINUR 100 (A) 10/03/2016 1342   NITRITE NEGATIVE 10/03/2016 1342   LEUKOCYTESUR NEGATIVE 10/03/2016  1342      Radiographic Studies: DG Foot Complete Right  Result Date: 02/19/2023 CLINICAL DATA:  Infection EXAM: RIGHT FOOT COMPLETE - 3+ VIEW COMPARISON:  01/03/2022 FINDINGS: Status post trans metatarsal amputation first through fifth digits. Large wound or ulcer lateral aspect  of the foot at the level of fourth and fifth residual metatarsals. Suspected ulcer or wound lateral to the hindfoot. Vascular calcifications. Abundant soft tissue swelling. Periosteal thickening at the third fourth and fifth metatarsals. Gas surrounding the fifth metatarsal stump. Possible demineralization at the fifth metatarsal stump. 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 Large wound or ulcer lateral aspect of the foot at the level of fourth and fifth metatarsals. Abundant soft tissue swelling. Periosteal thickening at the third fourth and fifth metatarsals air some of which is chronic. Possible demineralization at the fifth metatarsal stump making it difficult to exclude osteomyelitis. Correlation with MRI is suggested. Electronically Signed   By: Jasmine Pang M.D.   On: 02/19/2023 16:06    EKG: No EKG on file   Assessment/Plan:   Principal Problem:   Diabetic infection of right foot (HCC) Active Problems:   Hyperlipidemia   Chronic venous insufficiency   Hypertension   Obesity    Body mass index is 39.75 kg/m.  (Obesity)    Right diabetic foot ulcer with infection: Admit to MedSurg.  X-ray of the right foot is unable to exclude osteomyelitis.  MRI of the right foot with contrast has been ordered for further evaluation.  Treat with IV cefepime and IV vancomycin.  Blood cultures have been obtained in the ED.  Consulted Dr. Excell Seltzer, podiatrist, and Dr. [name], vascular surgeon, to assist with management (Case discussed via secure chat).   Insulin-dependent diabetes mellitus: Patient said he takes metformin, Lantus 30 units nightly and Humalog 15 units 3 times daily  with meals.  Glucose levels are okay.  Will use Lantus 15 units nightly and Humalog 10 units 3 times daily with meals for now.  Use NovoLog as needed for hyperglycemia. Check hemoglobin A1c.   PVD: Dr. [name], vascular surgeon, has been consulted.   Lactic acidosis: Lactic acid was 2.1.  Patient is not septic.  Etiology is not clear but this could be from metformin.  Repeat lactic acid tomorrow.   Other comorbidities include hypertension, hyperlipidemia, obesity, chronic venous insufficiency  Other information:   DVT prophylaxis: heparin injection 5,000 Units Start: 02/19/23 2200  Code Status: Full code. Family Communication: Plan discussed with Harriett Sine, sister, at the bedside Disposition Plan: Plan to discharge home in 3 to 4 days Consults called: Podiatry, vascular surgeon Admission status: Inpatient  The medical decision making on this patient was of high complexity and the patient is at high risk for clinical deterioration, therefore this is a level 3 visit.    Linn Goetze [facility] Pager: Please check [email or web address]   How to contact the [facility] Attending or Consulting provider 7A - 7P or covering provider during after hours 7P -7A, for this patient?   Check the care team in [facility] and look for a) attending/consulting [facility] provider listed and b) the [facility] team listed Log into [email or web address] and use [place]'s universal password to access. If you do not have the password, please contact the hospital operator. Locate the [facility] provider you are looking for under [facility] and page to a number that you can be directly reached. If you still have difficulty reaching the provider, please page the [facility] (Director on Call) for the Hospitalists listed on amion for assistance.  02/19/2023, 6:04 PM

## 2023-02-19 NOTE — ED Notes (Signed)
Assisted patient with urinal. Collected and sent urine sample

## 2023-02-20 ENCOUNTER — Inpatient Hospital Stay: Payer: Medicare HMO

## 2023-02-20 ENCOUNTER — Other Ambulatory Visit: Payer: Medicare HMO

## 2023-02-20 DIAGNOSIS — E11628 Type 2 diabetes mellitus with other skin complications: Secondary | ICD-10-CM | POA: Diagnosis not present

## 2023-02-20 DIAGNOSIS — L089 Local infection of the skin and subcutaneous tissue, unspecified: Secondary | ICD-10-CM | POA: Diagnosis not present

## 2023-02-20 LAB — BASIC METABOLIC PANEL
Anion gap: 6 (ref 5–15)
BUN: 14 mg/dL (ref 8–23)
CO2: 22 mmol/L (ref 22–32)
Calcium: 8.1 mg/dL — ABNORMAL LOW (ref 8.9–10.3)
Chloride: 108 mmol/L (ref 98–111)
Creatinine, Ser: 0.78 mg/dL (ref 0.61–1.24)
GFR, Estimated: >60 mL/min (ref >60)
Glucose, Bld: 123 mg/dL — ABNORMAL HIGH (ref 70–99)
Potassium: 4 mmol/L (ref 3.5–5.1)
Sodium: 136 mmol/L (ref 135–145)

## 2023-02-20 LAB — SEDIMENTATION RATE: Sed Rate: 40 mm/hr — ABNORMAL HIGH (ref 0–20)

## 2023-02-20 LAB — GLUCOSE, CAPILLARY
Glucose-Capillary: 116 mg/dL — ABNORMAL HIGH (ref 70–99)
Glucose-Capillary: 151 mg/dL — ABNORMAL HIGH (ref 70–99)
Glucose-Capillary: 213 mg/dL — ABNORMAL HIGH (ref 70–99)
Glucose-Capillary: 137 mg/dL — ABNORMAL HIGH (ref 70–99)

## 2023-02-20 LAB — C-REACTIVE PROTEIN: CRP: 0.6 mg/dL (ref <1.0)

## 2023-02-20 LAB — LACTIC ACID, PLASMA: Lactic Acid, Venous: 0.8 mmol/L (ref 0.5–1.9)

## 2023-02-20 MED ORDER — GADOBUTROL 1 MMOL/ML IV SOLN
10.0000 mL | Freq: Once | INTRAVENOUS | Status: AC | PRN
  Administered 2023-02-20: 10 mL via INTRAVENOUS

## 2023-02-20 MED ORDER — VANCOMYCIN HCL 1250 MG/250ML IV SOLN
1250.0000 mg | Freq: Two times a day (BID) | INTRAVENOUS | Status: DC
  Administered 2023-02-20 – 2023-02-22 (×5): 1250 mg via INTRAVENOUS
  Filled 2023-02-20 (×5): qty 250

## 2023-02-20 NOTE — Consult Note (Signed)
[facility] Antibiotic Note  [name] is a 72 y.o. male admitted on 02/19/2023 with  DFI / OM .  Pharmacy has been consulted for vancomycin and cefepime dosing.  Discuss therapy with Dr Myriam Forehand. He wants to [facility] for podiatry assessment before making any Abx changes.  Serum creatinine deceased from 1.06 > 0.78.   Plan: Antibiotic Day#2 Continue Cefepime 2 g IV q8h Change  Vancomycin dose to 1250mg   IV q12h   Goal AUC 400-550  Expected AUC: 494.6  Cmin:14.0 SCr used: 0.8 (actual 0.78) Daily Scr per protocol Levels at steady state or as clinically indicated  Height: 5\' 11"  (180.3 cm) Weight: 129.3 kg (285 lb) IBW/kg (Calculated) : 75.3  Temp (24hrs), Avg:98 F (36.7 C), Min:97.7 F (36.5 C), Max:98.6 F (37 C)  Recent Labs  Lab 02/19/23 1319 02/19/23 1327 02/19/23 1328 02/20/23 0505  WBC  --  5.3  --   --   CREATININE  --  1.06  --  0.78  LATICACIDVEN 1.4  --  2.1* 0.8     Estimated Creatinine Clearance: 114.4 mL/min (by C-G formula based on SCr of 0.78 mg/dL).    Allergies  Allergen Reactions   Sulfa Antibiotics Rash   Zosyn [Piperacillin Sod-Tazobactam So] Rash    Antimicrobials this admission: Cefepime 6/20 >>  Vancomycin 6/20 >>   Dose adjustments this admission:N/A  Microbiology results: 6/20 BCx: NG < 24hrs  Thank you for allowing pharmacy to be a part of this patient's care.  Dariella Gillihan Rodriguez-Guzman PharmD, BCPS 02/20/2023 9:31 AM

## 2023-02-20 NOTE — H&P (View-Only) (Signed)
Hospital Consult    Reason for Consult:  Right foot wound/ulcer Requesting Physician:  Dr Bernard Ayiku MD MRN #:  [id number]  History of Present Illness: This is a 72 y.o. 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 He said he has had this ulcer since he has right transmetatarsal amputation in May 2023.    About 2 weeks ago, he noticed a foul smell from the right foot ulcer.  He also noticed that there was drainage from the ulcer.  He could not tell the color of the drainage because he said he cannot see the plantar aspect/bottom of his feet.  He noticed increased burning sensation on the right foot as well.  He was concerned about losing his foot so he presented to the emergency department for further management.   On exam today patient was resting comfortably in bed.  His right foot was wrapped with an Ace bandage which I did not remove.  Patient endorses he has had all of his toes amputated on his right foot prior.  He states his right foot is very painful at rest as well as walking.  Patient has no other complaints at this time.  Vitals all remain stable.  Past Medical History:  Diagnosis Date   Arthritis    Bladder incontinence    Chronic kidney disease    Stage 3 Kidney disease   Dementia (HCC)    Diabetes mellitus without complication (HCC)    DVT of leg (deep venous thrombosis) (HCC)    H/O RIGHT LEG 2018   Dyspnea    occassional   Hyperlipidemia    Hypertension    Peripheral vascular disease (HCC)    Sepsis (HCC)     Past Surgical History:  Procedure Laterality Date   ABDOMINAL AORTOGRAM W/LOWER EXTREMITY N/A  10/10/2016   Procedure: Abdominal Aortogram w/Lower Extremity;  Surgeon: Gregory G Schnier, MD;  Location: [facility];  Service: Cardiovascular;  Laterality: N/A;   CATARACT EXTRACTION W/PHACO Left 05/27/2018   Procedure: CATARACT EXTRACTION PHACO AND INTRAOCULAR LENS PLACEMENT (IOC);  Surgeon: King, Bradley Mark, MD;  Location: [facility];  Service: Ophthalmology;  Laterality: Left;  US 03:06.2 AP% 16.1 CDE 30.16 FLUID PACK LOT @ [id number]   CIRCUMCISION  1998   EYE SURGERY Left    Retina Detachment   GRAFT APPLICATION Right 05/29/2017   Procedure: GRAFT APPLICATION ( RIGHT FOOT );  Surgeon: Schnier, Gregory G, MD;  Location: [facility];  Service: Vascular;  Laterality: Right;  graft taken from patients right thigh   IRRIGATION AND DEBRIDEMENT FOOT Right 10/03/2016   Procedure: IRRIGATION AND DEBRIDEMENT FOOT;  Surgeon: Justin Fowler, DPM;  Location: [facility];  Service: Podiatry;  Laterality: Right;   LOWER EXTREMITY INTERVENTION  10/10/2016   Procedure: Lower Extremity Intervention;  Surgeon: Gregory G Schnier, MD;  Location: [facility];  Service: Cardiovascular;;   PERIPHERAL VASCULAR BALLOON ANGIOPLASTY Left 10/10/2016   Procedure: Peripheral Vascular Balloon Angioplasty;  Surgeon: Gregory G Schnier, MD;  Location: [facility];  Service: Cardiovascular;  Laterality: Left;   TONSILLECTOMY     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/2022   Procedure: TRANSMETATARSAL AMPUTATION;  Surgeon: Fowler, Justin, DPM;  Location: [facility];  Service: Podiatry;    Laterality: Right;   WOUND DEBRIDEMENT Right 11/07/2016   Procedure: DEBRIDEMENT OF WOUND AND BONE RIGHT FOOT AND APPLY WOUND VAC;  Surgeon: Justin Fowler, DPM;  Location: [facility];  Service: Podiatry;  Laterality: Right;    Allergies  Allergen Reactions   Sulfa Antibiotics Rash   Zosyn [Piperacillin Sod-Tazobactam So] Rash    Prior to Admission medications   Medication Sig Start Date End Date Taking? Authorizing Provider   acetaminophen (TYLENOL) 325 MG tablet Take 650 mg by mouth every 6 (six) hours as needed.   Yes [provider]  atorvastatin (LIPITOR) 10 MG tablet Take 10 mg by mouth every evening. 06/12/20  Yes [provider]  FEROSUL 325 (65 Fe) MG tablet Take 325 mg by mouth 2 (two) times daily with a meal. 12/26/21  Yes [provider]  insulin glargine (LANTUS) 100 UNIT/ML injection Inject 0.15 mLs (15 Units total) into the skin at bedtime. 10/13/16  Yes Kalisetti, Radhika, MD  insulin lispro (HUMALOG) 100 UNIT/ML injection Inject 4 Units into the skin See admin instructions. Inject 4 units SQ with breakfast, inject 4 units SQ with lunch and inject 4 units SQ with dinner   Yes [provider]  lisinopril (ZESTRIL) 5 MG tablet Take 1 tablet (5 mg total) by mouth daily. 01/08/22  Yes Patel, Sona, MD  metFORMIN (GLUCOPHAGE) 1000 MG tablet Take 1,000 mg by mouth 2 (two) times daily with a meal. 06/12/20  Yes [provider]  Multiple Vitamins-Minerals (CENTROVITE) TABS Take 1 tablet by mouth daily.   Yes [provider]  polyethylene glycol (MIRALAX / GLYCOLAX) packet Take 17 g by mouth daily.   Yes [provider]  senna (SENOKOT) 8.6 MG TABS tablet Take 1 tablet by mouth 2 (two) times daily.   Yes [provider]  Simethicone (GAS-X ULTRA STRENGTH PO) Take 1 capsule by mouth 4 (four) times daily as needed (for flatulence).   Yes [provider]  ACCU-CHEK GUIDE test strip Use 1 strip via meter four times a day  to monitor blood glucose 07/10/20   [provider]  Accu-Chek Softclix Lancets lancets  04/29/20   [provider]  docusate sodium (COLACE) 100 MG capsule Take 1 capsule (100 mg total) by mouth 2 (two) times daily. Patient not taking: Reported on 02/19/2023 10/13/16   Kalisetti, Radhika, MD  DROPLET INSULIN SYRINGE 31G X 5/16" 0.3 ML MISC USE FIVE TIMES DAILY AS DIRECTED 06/12/20   [provider]   glucose blood (ACCU-CHEK AVIVA PLUS) test strip  04/29/20   [provider]  Insulin Syringe-Needle U-100 31G X 5/16" 0.3 ML MISC  06/05/20   [provider]    Social History   Socioeconomic History   Marital status: Single    Spouse name: Not on file   Number of children: Not on file   Years of education: Not on file   Highest education level: Not on file  Occupational History   Not on file  Tobacco Use   Smoking status: Former    Types: Pipe    Quit date: 05/23/1975    Years since quitting: 47.7   Smokeless tobacco: Never  Vaping Use   Vaping Use: Never used  Substance and Sexual Activity   Alcohol use: No   Drug use: No   Sexual activity: Not on file  Other Topics Concern   Not on file  Social History Narrative   Not on file   Social Determinants of Health     Financial Resource Strain: Not on file  Food Insecurity: No Food Insecurity (02/19/2023)   Hunger Vital Sign    Worried About Running Out of Food in the Last Year: Never true    Ran Out of Food in the Last Year: Never true  Transportation Needs: No Transportation Needs (02/19/2023)   PRAPARE - Transportation    Lack of Transportation (Medical): No    Lack of Transportation (Non-Medical): No  Physical Activity: Not on file  Stress: Not on file  Social Connections: Not on file  Intimate Partner Violence: Not At Risk (02/19/2023)   Humiliation, Afraid, Rape, and Kick questionnaire    Fear of Current or Ex-Partner: No    Emotionally Abused: No    Physically Abused: No    Sexually Abused: No     Family History  Problem Relation Age of Onset   Diabetes Mother    Hypertension Mother    Diabetes Father    Hypertension Father     ROS: Otherwise negative unless mentioned in HPI  Physical Examination  Vitals:   02/20/23 0257 02/20/23 0734  BP: 104/61 120/66  Pulse: 80 78  Resp: 16 20  Temp: 98.6 F (37 C) 97.7 F (36.5 C)  SpO2: 97% 97%   Body mass index is 39.75  kg/m.  General:  WDWN in NAD Gait: Not observed HENT: WNL, normocephalic Pulmonary: normal non-labored breathing, without Rales, rhonchi,  wheezing Cardiac: regular, without  Murmurs, rubs or gallops; without carotid bruits Abdomen: Positive bowel sounds throughout, soft, NT/ND, no masses Skin: without rashes Vascular Exam/Pulses: Bilateral lower extremities +2 edema, unable to palpate pulses.  Both remain warm to touch.  Right lower extremity foot with bandage in place.  Clean dry and intact.  Slight tenderness with chronic erythematous changes on both lower extremities. Extremities: with ischemic changes, without Gangrene , without cellulitis; without open wounds;  Musculoskeletal: no muscle wasting or atrophy  Neurologic: A&O X 3;  No focal weakness or paresthesias are detected; speech is fluent/normal Psychiatric:  The pt has Normal affect. Lymph:  Unremarkable  CBC    Component Value Date/Time   WBC 5.3 02/19/2023 1327   RBC 3.95 (L) 02/19/2023 1327   HGB 11.7 (L) 02/19/2023 1327   HCT 36.1 (L) 02/19/2023 1327   PLT 332 02/19/2023 1327   MCV 91.4 02/19/2023 1327   MCH 29.6 02/19/2023 1327   MCHC 32.4 02/19/2023 1327   RDW 14.9 02/19/2023 1327   LYMPHSABS 1.2 02/19/2023 1327   MONOABS 0.8 02/19/2023 1327   EOSABS 0.2 02/19/2023 1327   BASOSABS 0.0 02/19/2023 1327    BMET    Component Value Date/Time   NA 136 02/20/2023 0505   NA 140 04/04/2013 1238   K 4.0 02/20/2023 0505   K 4.1 04/04/2013 1238   CL 108 02/20/2023 0505   CL 108 (H) 04/04/2013 1238   CO2 22 02/20/2023 0505   CO2 30 04/04/2013 1238   GLUCOSE 123 (H) 02/20/2023 0505   GLUCOSE 98 04/04/2013 1238   BUN 14 02/20/2023 0505   BUN 19 (H) 04/04/2013 1238   CREATININE 0.78 02/20/2023 0505   CREATININE 0.87 04/04/2013 1238   CALCIUM 8.1 (L) 02/20/2023 0505   CALCIUM 8.8 04/04/2013 1238   GFRNONAA >60 02/20/2023 0505   GFRNONAA >60 04/04/2013 1238   GFRAA >60 05/22/2017 0853   GFRAA >60 04/04/2013  1238    COAGS: Lab Results  Component Value Date   INR 1.1 01/03/2022   INR 2.28 05/22/2017   INR   1.00 11/30/2016     Non-Invasive Vascular Imaging:   EXAM:02/20/2023 MRI OF THE RIGHT FOREFOOT WITHOUT AND WITH CONTRAST   TECHNIQUE: Multiplanar, multisequence MR imaging of the right foot was performed before and after the administration of intravenous contrast.   CONTRAST:  10mL GADAVIST GADOBUTROL 1 MMOL/ML IV SOLN   COMPARISON:  Right foot x-rays from yesterday. MRI right foot dated Jan 03, 2022.   FINDINGS: Bones/Joint/Cartilage   Prior transmetatarsal amputation. Minimal marrow edema along the dorsal aspect of the distal residual fifth metatarsal (series 7, image 27). No fracture or dislocation. Midfoot degenerative changes. No joint effusion.   Ligaments   Lisfranc ligament is intact. Medial and lateral ankle ligaments are grossly intact.   Muscles and Tendons Postsurgical changes of the flexor and extensor tendons. No tenosynovitis. Complete fatty atrophy of the intrinsic muscles.   Soft tissue Diffuse soft tissue swelling. Soft tissue ulceration along lateral aspect of the amputation stump. No fluid collection. No soft tissue mass.   IMPRESSION: 1. Prior transmetatarsal amputation with soft tissue ulceration along the lateral aspect of the amputation stump. No abscess. 2. Minimal marrow edema along the dorsal aspect of the distal residual fifth metatarsal is nonspecific, although given its proximity to the soft tissue ulcer, early osteomyelitis is not excluded.  Statin:  Yes.   Beta Blocker:  No. Aspirin:  No. ACEI:  Yes.   ARB:  No. CCB use:  No Other antiplatelets/anticoagulants:  No.    ASSESSMENT/PLAN: This is a 72 y.o. male who presented to the hospital because of a nonhealing ulcer on the right foot. He said he has had this ulcer since he has right transmetatarsal amputation in May 2023. He has a history of right diabetic foot infection s/p  right transmetatarsal amputation in May 2023,   PLAN: Vascular surgery plans on taking the patient to the vascular lab on Tuesday, 02/24/2023 for a right lower extremity angiogram with possible intervention.  I discussed in detail with the patient the procedure, benefits, risks, and complications.  Patient verbalizes his understanding.  I answered all the patient's questions this afternoon.  Patient would like to proceed with the procedure as soon as possible.  Patient will be made n.p.o. Monday night at midnight.   -I discussed the plan in detail with Dr. Gregory Schnier MD and he agrees with the plan.   Henchy Mccauley R Kierre [facility] 02/20/2023 12:13 PM  

## 2023-02-20 NOTE — Consult Note (Signed)
PODIATRY / FOOT AND ANKLE SURGERY CONSULTATION NOTE  Requesting Physician: Dr. Myriam Forehand   Reason for consult: Right foot wound/possible infection  Chief Complaint: Right foot ulcer/infection   HPI: [name] is a 72 y.o. male who presents with a nonhealing ulceration to the plantar lateral aspect of the right foot.  He also noted drainage from the area and was concerned for infection so went to the emergency room.  Patient was subsequently admitted due to concerns for infection and osteomyelitis.  He did have x-ray imaging taken as well as an MRI which did not definitively show any osteomyelitis or abscess present.  Patient did appear to have some soft tissue gas present on x-ray but on MRI was not seen to have any.  Patient presents today resting in bed fairly comfortably.  He currently denies nausea, vomiting, fevers, chills.  PMHx:  Past Medical History:  Diagnosis Date   Arthritis    Bladder incontinence    Chronic kidney disease    Stage 3 Kidney disease   Dementia (HCC)    Diabetes mellitus without complication (HCC)    DVT of leg (deep venous thrombosis) (HCC)    H/O RIGHT LEG 2018   Dyspnea    occassional   Hyperlipidemia    Hypertension    Peripheral vascular disease (HCC)    Sepsis (HCC)     Surgical Hx:  Past Surgical History:  Procedure Laterality Date   ABDOMINAL AORTOGRAM W/LOWER EXTREMITY N/A 10/10/2016   Procedure: Abdominal Aortogram w/Lower Extremity;  Surgeon: Renford Dills, MD;  Location: [facility];  Service: Cardiovascular;  Laterality: N/A;   CATARACT EXTRACTION W/PHACO Left 05/27/2018   Procedure: CATARACT EXTRACTION PHACO AND INTRAOCULAR LENS PLACEMENT (IOC);  Surgeon: Nevada Crane, MD;  Location: [facility];  Service: Ophthalmology;  Laterality: Left;  [place] 03:06.2 AP% 16.1 CDE 30.16 FLUID PACK LOT @ [id number] H   CIRCUMCISION  1998   EYE SURGERY Left    Retina Detachment   GRAFT APPLICATION Right 05/29/2017   Procedure: GRAFT  APPLICATION ( RIGHT FOOT );  Surgeon: Renford Dills, MD;  Location: [facility];  Service: Vascular;  Laterality: Right;  graft taken from patients right thigh   IRRIGATION AND DEBRIDEMENT FOOT Right 10/03/2016   Procedure: IRRIGATION AND DEBRIDEMENT FOOT;  Surgeon: Gwyneth Revels, DPM;  Location: [facility];  Service: Podiatry;  Laterality: Right;   LOWER EXTREMITY INTERVENTION  10/10/2016   Procedure: Lower Extremity Intervention;  Surgeon: Renford Dills, MD;  Location: [facility];  Service: Cardiovascular;;   PERIPHERAL VASCULAR BALLOON ANGIOPLASTY Left 10/10/2016   Procedure: Peripheral Vascular Balloon Angioplasty;  Surgeon: Renford Dills, MD;  Location: [facility];  Service: Cardiovascular;  Laterality: Left;   TONSILLECTOMY     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 His smoking use included pipe. He has never used smokeless tobacco. He reports that he does not  drink alcohol and does not use drugs.  Allergies:  Allergies  Allergen Reactions   Sulfa Antibiotics Rash   Zosyn [Piperacillin Sod-Tazobactam So] Rash    Medications Prior to Admission  Medication Sig Dispense Refill   acetaminophen (TYLENOL) 325 MG tablet Take 650 mg by mouth every 6 (six) hours as needed.     atorvastatin (LIPITOR) 10 MG tablet Take 10 mg by mouth every evening.     FEROSUL 325 (65 Fe) MG tablet Take 325 mg by mouth 2 (two) times daily with a  meal.     insulin glargine (LANTUS) 100 UNIT/ML injection Inject 0.15 mLs (15 Units total) into the skin at bedtime. 10 mL 11   insulin lispro (HUMALOG) 100 UNIT/ML injection Inject 4 Units into the skin See admin instructions. Inject 4 units SQ with breakfast, inject 4 units SQ with lunch and inject 4 units SQ with dinner     lisinopril (ZESTRIL) 5 MG tablet Take 1 tablet (5 mg total) by mouth daily. 30 tablet 3   metFORMIN (GLUCOPHAGE) 1000 MG tablet Take 1,000 mg by mouth 2 (two) times daily with a meal.     Multiple Vitamins-Minerals (CENTROVITE) TABS Take 1 tablet by mouth daily.     polyethylene glycol (MIRALAX / GLYCOLAX) packet Take 17 g by mouth daily.     senna (SENOKOT) 8.6 MG TABS tablet Take 1 tablet by mouth 2 (two) times daily.     Simethicone (GAS-X ULTRA STRENGTH PO) Take 1 capsule by mouth 4 (four) times daily as needed (for flatulence).     ACCU-CHEK GUIDE test strip Use 1 strip via meter four times a day  to monitor blood glucose     Accu-Chek Softclix Lancets lancets      docusate sodium (COLACE) 100 MG capsule Take 1 capsule (100 mg total) by mouth 2 (two) times daily. (Patient not taking: Reported on 02/19/2023) 10 capsule 0   DROPLET INSULIN SYRINGE 31G X 5/16" 0.3 ML MISC USE FIVE TIMES DAILY AS DIRECTED     glucose blood (ACCU-CHEK AVIVA PLUS) test strip      Insulin Syringe-Needle U-100 31G X 5/16" 0.3 ML MISC       Physical Exam: General: Alert and oriented.  No apparent distress.  Vascular: DP/PT pulses nonpalpable bilateral.  Capillary fill time appears to be intact to digits of the left foot and flap to the right foot, no hair growth present to bilateral lower extremities.  Moderate to severe right lower extremity nonpitting edema, mild to moderate left lower extremity.  Erythema appears to be apparent to the anterior lateral ankle extending at the dorsal lateral foot.  Neuro: Light touch sensation reduced to bilateral lower extremities.  Derm: Open ulceration  present to the right fifth metatarsal area extending at the dorsal medial aspect to the plantar medial aspect somewhat circumferentially around the area, large amount of hyperkeratotic buildup, no obvious bone exposed at this time, mild surrounding erythema and edema that extends up the dorsal lateral foot, mild odor, no purulence, mild serous drainage.  Probes close to bone but appears to have soft tissue coverage over the area at this time.  A couple of small pinpoint areas at the lateral foot extending up close to the lateral ankle that appear to have open wounds that are very small with some minimal serous drainage, corresponding erythema present to this area.  MSK: Right transmetatarsal amputation.  Results for orders placed or performed during the hospital encounter of 02/19/23 (from the past 48  hour(s))  Lactic acid, plasma     Status: None   Collection Time: 02/19/23  1:19 PM  Result Value Ref Range   Lactic Acid, Venous 1.4 0.5 - 1.9 mmol/L    Comment: Performed at [facility], [address] [place], [place] [address]  CBC with Differential     Status: Abnormal   Collection Time: 02/19/23  1:27 PM  Result Value Ref Range   WBC 5.3 4.0 - 10.5 K/uL   RBC 3.95 (L) 4.22 - 5.81 MIL/uL   Hemoglobin 11.7 (L) 13.0 - 17.0 g/dL   HCT 60.4 (L) 54.0 - 98.1 %   MCV 91.4 80.0 - 100.0 fL   MCH 29.6 26.0 - 34.0 pg   MCHC 32.4 30.0 - 36.0 g/dL   RDW 19.1 47.8 - 29.5 %   Platelets 332 150 - 400 K/uL   nRBC 0.0 0.0 - 0.2 %   Neutrophils Relative % 58 %   Neutro Abs 3.1 1.7 - 7.7 K/uL   Lymphocytes Relative 22 %   Lymphs Abs 1.2 0.7 - 4.0 K/uL   Monocytes Relative 16 %   Monocytes Absolute 0.8 0.1 - 1.0 K/uL   Eosinophils Relative 3 %   Eosinophils Absolute 0.2 0.0 - 0.5 K/uL   Basophils Relative 1 %   Basophils Absolute 0.0 0.0 - 0.1 K/uL   Immature Granulocytes 0 %   Abs Immature Granulocytes 0.01 0.00 - 0.07 K/uL    Comment: Performed at [facility], [address], [place], [place] [address]  Basic metabolic panel     Status: Abnormal   Collection Time: 02/19/23  1:27 PM  Result Value Ref Range   Sodium 138 135 - 145 mmol/L   Potassium 4.8 3.5 - 5.1 mmol/L   Chloride 106 98 - 111 mmol/L   CO2 24 22 - 32 mmol/L   Glucose, Bld 120 (H) 70 - 99 mg/dL    Comment: Glucose reference range applies only to samples taken after fasting for at least 8 hours.   BUN 17 8 - 23 mg/dL   Creatinine, Ser 8.65 0.61 - 1.24 mg/dL   Calcium 9.2 8.9 - 78.4 mg/dL   GFR, Estimated >69 >62 mL/min    Comment: (NOTE) Calculated using the CKD-EPI Creatinine Equation (2021)    Anion gap 8 5 - 15    Comment: Performed at [facility], [address] [place], [place] [address]  Hemoglobin A1c     Status: Abnormal   Collection Time: 02/19/23  1:27 PM  Result Value Ref Range   Hgb A1c MFr Bld 6.4 (H) 4.8 - 5.6 %    Comment: (NOTE) Pre diabetes:          5.7%-6.4%  Diabetes:              >6.4%  Glycemic control for   <7.0% adults with diabetes    Mean Plasma Glucose 136.98 mg/dL    Comment: Performed at [facility], [address] [place], [place] [address]  Lactic acid, plasma     Status: Abnormal   Collection Time: 02/19/23  1:28 PM  Result Value Ref Range   Lactic Acid, Venous 2.1 (HH) 0.5 - 1.9 mmol/L    Comment: CRITICAL RESULT CALLED TO, READ BACK BY AND VERIFIED WITH BRANDY [facility] 02/19/23 1404 MW Performed at [facility], [address] [place], [place] [address]   CBG monitoring, ED     Status: Abnormal   Collection Time:  02/19/23  3:34 PM  Result Value Ref Range   Glucose-Capillary 127 (H) 70 - 99 mg/dL    Comment: Glucose reference range applies only to samples taken after fasting for at least 8 hours.  Culture, blood (routine x 2)     Status: None (Preliminary result)   Collection Time: 02/19/23  5:29 PM   Specimen: BLOOD  Result Value Ref Range   Specimen Description BLOOD BLOOD RIGHT FOREARM    Special Requests       BOTTLES DRAWN AEROBIC AND ANAEROBIC Blood Culture results may not be optimal due to an excessive volume of blood received in culture bottles   Culture      NO GROWTH < 24 HOURS Performed at [facility], [address], [place], [place] [address]    Report Status PENDING   Culture, blood (routine x 2)     Status: None (Preliminary result)   Collection Time: 02/19/23  5:31 PM   Specimen: BLOOD  Result Value Ref Range   Specimen Description BLOOD BLOOD RIGHT ARM    Special Requests      BOTTLES DRAWN AEROBIC AND ANAEROBIC Blood Culture adequate volume   Culture      NO GROWTH < 24 HOURS Performed at [facility], [address], [place], [place] [address]    Report Status PENDING   Glucose, capillary     Status: Abnormal   Collection Time: 02/19/23 11:14 PM  Result Value Ref Range   Glucose-Capillary 175 (H) 70 - 99 mg/dL    Comment: Glucose reference range applies only to samples taken after fasting for at least 8 hours.  Basic metabolic panel     Status: Abnormal   Collection Time: 02/20/23  5:05 AM  Result Value Ref Range   Sodium 136 135 - 145 mmol/L   Potassium 4.0 3.5 - 5.1 mmol/L   Chloride 108 98 - 111 mmol/L   CO2 22 22 - 32 mmol/L   Glucose, Bld 123 (H) 70 - 99 mg/dL    Comment: Glucose reference range applies only to samples taken after fasting for at least 8 hours.   BUN 14 8 - 23 mg/dL   Creatinine, Ser 8.65 0.61 - 1.24 mg/dL   Calcium 8.1 (L) 8.9 - 10.3 mg/dL   GFR, Estimated >78 >46 mL/min    Comment: (NOTE) Calculated using the CKD-EPI Creatinine Equation (2021)    Anion gap 6 5 - 15    Comment: Performed at [facility] [address] [place], [place] [address]  Lactic acid, plasma     Status: None   Collection Time: 02/20/23  5:05 AM  Result Value Ref Range   Lactic Acid, Venous 0.8 0.5 - 1.9 mmol/L    Comment: Performed at [facility], [address] [place], [place] [address]  Sedimentation rate      Status: Abnormal   Collection Time: 02/20/23  7:19 AM  Result Value Ref Range   Sed Rate 40 (H) 0 - 20 mm/hr    Comment: Performed at [facility], [address] [place], [place] [address]  C-reactive protein     Status: None   Collection Time: 02/20/23  7:19 AM  Result Value Ref Range   CRP 0.6 <1.0 mg/dL    Comment: Performed at [facility], [address], [place], [place] [address]  Glucose, capillary     Status: Abnormal   Collection Time: 02/20/23  7:36 AM  Result Value Ref Range  Glucose-Capillary 116 (H) 70 - 99 mg/dL    Comment: Glucose reference range applies only to samples taken after fasting for at least 8 hours.   MR FOOT RIGHT W WO CONTRAST  Result Date: 02/20/2023 CLINICAL DATA:  Right foot ulcer. History of transmetatarsal amputation. EXAM: MRI OF THE RIGHT FOREFOOT WITHOUT AND WITH CONTRAST TECHNIQUE: Multiplanar, multisequence MR imaging of the right foot was performed before and after the administration of intravenous contrast. CONTRAST:  10mL GADAVIST GADOBUTROL 1 MMOL/ML IV SOLN COMPARISON:  Right foot x-rays from yesterday. MRI right foot dated Jan 03, 2022. FINDINGS: Bones/Joint/Cartilage Prior transmetatarsal amputation. Minimal marrow edema along the dorsal aspect of the distal residual fifth metatarsal (series 7, image 27). No fracture or dislocation. Midfoot degenerative changes. No joint effusion. Ligaments Lisfranc ligament is intact. Medial and lateral ankle ligaments are grossly intact. Muscles and Tendons Postsurgical changes of the flexor and extensor tendons. No tenosynovitis. Complete fatty atrophy of the intrinsic muscles. Soft tissue Diffuse soft tissue swelling. Soft tissue ulceration along lateral aspect of the amputation stump. No fluid collection. No soft tissue mass. IMPRESSION: 1. Prior transmetatarsal amputation with soft tissue ulceration along the lateral aspect of the amputation stump. No abscess. 2. Minimal marrow edema along the  dorsal aspect of the distal residual fifth metatarsal is nonspecific, although given its proximity to the soft tissue ulcer, early osteomyelitis is not excluded. Electronically Signed   By: Obie Dredge M.D.   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 Large wound or ulcer lateral aspect of the foot at the level of fourth and fifth residual metatarsals. Suspected ulcer or wound lateral to the hindfoot. Vascular calcifications. Abundant soft tissue swelling. Periosteal thickening at the third fourth and fifth metatarsals. Gas surrounding the fifth metatarsal stump. Possible demineralization at the fifth metatarsal stump. 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 Large wound or ulcer lateral aspect of the foot at the level of fourth and fifth metatarsals. Abundant soft tissue swelling. Periosteal thickening at the third fourth and fifth metatarsals air some of which is chronic. Possible demineralization at the fifth metatarsal stump making it difficult to exclude osteomyelitis. Correlation with MRI is suggested. Electronically Signed   By: Jasmine Pang M.D.   On: 02/19/2023 16:06    Blood pressure 120/66, pulse 78, temperature 97.7 F (36.5 C), temperature source Oral, resp. rate 20, height 5\' 11"  (1.803 m), weight 129.3 kg, SpO2 97 %.  Assessment Right foot cellulitis secondary to diabetic foot ulceration Venous insufficiency PVD Diabetes type 2 with polyneuropathy  Plan -Patient seen and examined -MRI and x-ray imaging reviewed.  No definitive evidence for osteomyelitis, no abscess.  White blood cell count within normal limits, ESR elevated but less than it was when he had bone infection previously.  CRP within normal limits. -Wound appears to probe close to bone near  the fifth metatarsal distally at the level of the amputation site but appears to have coverage still at this time, no obvious bone exposed, mild serous drainage present from the wounds with mild odor.  Proximal lateral foot wounds appear to be stable at this time with minimal serous drainage. -Patient does appear to have erythema present around the wounds to the legs extending up the lateral ankle area.  Likely related cellulitic changes from the wounds themselves but patient  also has some degree of venous insufficiency with lower extremity edema in general.  Unable to palpate pulses in patient's previous ABIs appeared to be abnormal. -Consultation placed with vascular surgery.  Appreciate recommendations. -Wound culture taken and sent off. -Will revisit patient tomorrow and likely perform bedside debridement.  If appears to be doing well without any issues could potentially sign off at that point with oral antibiotics if redness and swelling improved.  Rosetta Posner, DPM 02/20/2023, 11:45 AM

## 2023-02-20 NOTE — Progress Notes (Addendum)
Progress Note    [name]  YSA:[id number] DOB: 11-21-1950  DOA: 02/19/2023 PCP: Abram Sander, MD      Brief Narrative:    Medical records reviewed and are as summarized below:  [name] is a 72 y.o. 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  He said he has had this ulcer since he has right transmetatarsal amputation in May 2023.  About 2 weeks ago, he noticed a foul smell from the right foot ulcer.  He also noticed that there was drainage from the ulcer.  He could not tell the color of the drainage because he said he cannot see the plantar aspect/bottom of his feet.  He noticed increased burning sensation on the right foot as well.  He was concerned about losing his foot so he presented to the emergency department for further management.  He reported diarrhea about 2 weeks ago and this was followed by constipation few days later.    He was admitted to the hospital for right diabetic foot ulcer with infection and suspected osteomyelitis of the right foot.    Assessment/Plan:   Principal Problem:   Diabetic infection of right foot (HCC) Active Problems:   Hyperlipidemia   Chronic venous insufficiency   Hypertension   Obesity   Body mass index is 39.75 kg/m.  (Obesity)   Right diabetic foot ulcer with infection: Patient was evaluated by Dr. Excell Seltzer, podiatrist. Per Dr. Excell Seltzer, no evidence of right foot abscess or osteomyelitis at this time.  Continue empiric IV antibiotics.  Follow-up with podiatrist. CRP was normal on ESR was only slightly elevated at 40.   Insulin-dependent diabetes mellitus:  Continue insulin glargine 15 units nightly, NovoLog 10 units 3 times daily with meals and NovoLog as needed for hyperglycemia. Patient said he takes metformin, Lantus 30 units nightly and Humalog 15 units 3 times daily with meals.  Hemoglobin A1c 6.4.    PVD: Follow-up with vascular surgeon..     Lactic acidosis: Resolved.    Stage II decubitus ulcers on bilateral buttocks, stage II decubitus ulcers on bilateral hips: All present on admission.  Continue local wound care.      Other comorbidities include hypertension, hyperlipidemia, obesity, chronic venous insufficiency    Diet Order             Diet Carb Modified Fluid consistency: Thin; Room service appropriate? Yes  Diet effective now                            Consultants: Podiatrist Vascular surgeon  Procedures: None    Medications:    heparin  5,000 Units Subcutaneous Q8H   insulin aspart  0-9 Units Subcutaneous TID WC   insulin aspart  10 Units Subcutaneous TID WC   insulin glargine-yfgn  15 Units Subcutaneous QHS   Continuous Infusions:  ceFEPime (MAXIPIME) IV 2 g (02/20/23 1522)   vancomycin 1,250 mg (02/20/23 1153)     Anti-infectives (From admission, onward)    Start     Dose/Rate Route Frequency Ordered Stop   02/20/23 2200  vancomycin (VANCOREADY) IVPB 2000 mg/400 mL  Status:  Discontinued  2,000 mg 200 mL/hr over 120 Minutes Intravenous Every 24 hours 02/19/23 1754 02/20/23 0940   02/20/23 1100  vancomycin (VANCOREADY) IVPB 1250 mg/250 mL        1,250 mg 166.7 mL/hr over 90 Minutes Intravenous Every 12 hours 02/20/23 0940     02/19/23 2000  ceFEPIme (MAXIPIME) 2 g in sodium chloride 0.9 % 100 mL IVPB        2 g 200 mL/hr over 30 Minutes Intravenous Every 8 hours 02/19/23 1749     02/19/23 1900  vancomycin (VANCOREADY) IVPB 2000 mg/400 mL        2,000 mg 200 mL/hr over 120 Minutes Intravenous  Once 02/19/23 1749 02/20/23 0107   02/19/23 1715  ceFEPIme (MAXIPIME) 1 g in  sodium chloride 0.9 % 100 mL IVPB  Status:  Discontinued        1 g 200 mL/hr over 30 Minutes Intravenous  Once 02/19/23 1711 02/19/23 1747   02/19/23 1715  vancomycin (VANCOCIN) IVPB 1000 mg/200 mL premix  Status:  Discontinued        1,000 mg 200 mL/hr over 60 Minutes Intravenous  Once 02/19/23 1711 02/19/23 1747              Family Communication/Anticipated D/C date and plan/Code Status   DVT prophylaxis: heparin injection 5,000 Units Start: 02/19/23 2200     Code Status: Full Code  Family Communication: None Disposition Plan: Plan to charge home in 2 to 3 days   Status is: Inpatient Remains inpatient appropriate because: Right foot infection on IV antibiotics       Subjective:   Interval events noted.  Burning pain in the right foot is better today.  No other complaints.  No shortness of breath or chest pain.  Objective:    Vitals:   02/19/23 1838 02/20/23 0257 02/20/23 0734 02/20/23 1605  BP: 119/67 104/61 120/66 129/67  Pulse: 90 80 78 79  Resp: 20 16 20 18   Temp:  98.6 F (37 C) 97.7 F (36.5 C) 97.9 F (36.6 C)  TempSrc:   Oral   SpO2: 100% 97% 97% 99%  Weight:      Height:       No data found.   Intake/Output Summary (Last 24 hours) at 02/20/2023 1703 Last data filed at 02/20/2023 1448 Gross per 24 hour  Intake 629.83 ml  Output 2100 ml  Net -1470.17 ml   Filed Weights   02/19/23 1324  Weight: 129.3 kg    Exam:  GEN: NAD SKIN: Warm and dry EYES: EOMI ENT: MMM CV: RRR PULM: CTA B ABD: soft, ND, NT, +BS CNS: AAO x 3, non focal EXT: Dressing on right foot wounds is clean, dry and intact.  No tenderness.  Some chronic erythematous changes on bilateral legs.    Pressure Injury 02/19/23 Buttocks Right;Left Stage 2 -  Partial thickness loss of dermis presenting as a shallow open injury with a red, pink wound bed without slough. Red/pink wound bed exposed (Active)  02/19/23 2000  Location: Buttocks  Location Orientation:  Right;Left  Staging: Stage 2 -  Partial thickness loss of dermis presenting as a shallow open injury with a red, pink wound bed without slough.  Wound Description (Comments): Red/pink wound bed exposed  Present on Admission: Yes  Dressing Type Foam - Lift dressing to assess site every shift 02/19/23 2000     Pressure Injury 02/19/23 Hip Anterior;Right Stage 2 -  Partial thickness loss of dermis presenting as a shallow open injury  with a red, pink wound bed without slough. red/pink wound bed (Active)  02/19/23 2000  Location: Hip  Location Orientation: Anterior;Right  Staging: Stage 2 -  Partial thickness loss of dermis presenting as a shallow open injury with a red, pink wound bed without slough.  Wound Description (Comments): red/pink wound bed  Present on Admission: Yes  Dressing Type Foam - Lift dressing to assess site every shift 02/19/23 2000     Pressure Injury 02/19/23 Hip Anterior;Left Stage 2 -  Partial thickness loss of dermis presenting as a shallow open injury with a red, pink wound bed without slough. red/pink wound base (Active)  02/19/23 2000  Location: Hip  Location Orientation: Anterior;Left  Staging: Stage 2 -  Partial thickness loss of dermis presenting as a shallow open injury with a red, pink wound bed without slough.  Wound Description (Comments): red/pink wound base  Present on Admission: Yes  Dressing Type Foam - Lift dressing to assess site every shift 02/19/23 2000     Data Reviewed:   I have personally reviewed following labs and imaging studies:  Labs: Labs show the following:   Basic Metabolic Panel: Recent Labs  Lab 02/19/23 1327 02/20/23 0505  NA 138 136  K 4.8 4.0  CL 106 108  CO2 24 22  GLUCOSE 120* 123*  BUN 17 14  CREATININE 1.06 0.78  CALCIUM 9.2 8.1*   GFR Estimated Creatinine Clearance: 114.4 mL/min (by C-G formula based on SCr of 0.78 mg/dL). Liver Function Tests: No results for input(s): "AST", "ALT", "ALKPHOS", "BILITOT",  "PROT", "ALBUMIN" in the last 168 hours. No results for input(s): "LIPASE", "AMYLASE" in the last 168 hours. No results for input(s): "AMMONIA" in the last 168 hours. Coagulation profile No results for input(s): "INR", "PROTIME" in the last 168 hours.  CBC: Recent Labs  Lab 02/19/23 1327  WBC 5.3  NEUTROABS 3.1  HGB 11.7*  HCT 36.1*  MCV 91.4  PLT 332   Cardiac Enzymes: No results for input(s): "CKTOTAL", "CKMB", "CKMBINDEX", "TROPONINI" in the last 168 hours. BNP (last 3 results) No results for input(s): "PROBNP" in the last 8760 hours. CBG: Recent Labs  Lab 02/19/23 1534 02/19/23 2314 02/20/23 0736 02/20/23 1155  GLUCAP 127* 175* 116* 151*   D-Dimer: No results for input(s): "DDIMER" in the last 72 hours. Hgb A1c: Recent Labs    02/19/23 1327  HGBA1C 6.4*   Lipid Profile: No results for input(s): "CHOL", "HDL", "LDLCALC", "TRIG", "CHOLHDL", "LDLDIRECT" in the last 72 hours. Thyroid function studies: No results for input(s): "TSH", "T4TOTAL", "T3FREE", "THYROIDAB" in the last 72 hours.  Invalid input(s): "FREET3" Anemia work up: No results for input(s): "VITAMINB12", "FOLATE", "FERRITIN", "TIBC", "IRON", "RETICCTPCT" in the last 72 hours. Sepsis Labs: Recent Labs  Lab 02/19/23 1319 02/19/23 1327 02/19/23 1328 02/20/23 0505  WBC  --  5.3  --   --   LATICACIDVEN 1.4  --  2.1* 0.8    Microbiology Recent Results (from the past 240 hour(s))  Culture, blood (routine x 2)     Status: None (Preliminary result)   Collection Time: 02/19/23  5:29 PM   Specimen: BLOOD  Result Value Ref Range Status   Specimen Description BLOOD BLOOD RIGHT FOREARM  Final   Special Requests   Final    BOTTLES DRAWN AEROBIC AND ANAEROBIC Blood Culture results may not be optimal due to an excessive volume of blood received in culture bottles   Culture   Final    NO GROWTH < 24 HOURS  Performed at [facility], [address] [place], [place] [address]    Report Status  PENDING  Incomplete  Culture, blood (routine x 2)     Status: None (Preliminary result)   Collection Time: 02/19/23  5:31 PM   Specimen: BLOOD  Result Value Ref Range Status   Specimen Description BLOOD BLOOD RIGHT ARM  Final   Special Requests   Final    BOTTLES DRAWN AEROBIC AND ANAEROBIC Blood Culture adequate volume   Culture   Final    NO GROWTH < 24 HOURS Performed at [facility], [address], [place], [place] [address]    Report Status PENDING  Incomplete    Procedures and diagnostic studies:  MR FOOT RIGHT W WO CONTRAST  Result Date: 02/20/2023 CLINICAL DATA:  Right foot ulcer. History of transmetatarsal amputation. EXAM: MRI OF THE RIGHT FOREFOOT WITHOUT AND WITH CONTRAST TECHNIQUE: Multiplanar, multisequence MR imaging of the right foot was performed before and after the administration of intravenous contrast. CONTRAST:  10mL GADAVIST GADOBUTROL 1 MMOL/ML IV SOLN COMPARISON:  Right foot x-rays from yesterday. MRI right foot dated Jan 03, 2022. FINDINGS: Bones/Joint/Cartilage Prior transmetatarsal amputation. Minimal marrow edema along the dorsal aspect of the distal residual fifth metatarsal (series 7, image 27). No fracture or dislocation. Midfoot degenerative changes. No joint effusion. Ligaments Lisfranc ligament is intact. Medial and lateral ankle ligaments are grossly intact. Muscles and Tendons Postsurgical changes of the flexor and extensor tendons. No tenosynovitis. Complete fatty atrophy of the intrinsic muscles. Soft tissue Diffuse soft tissue swelling. Soft tissue ulceration along lateral aspect of the amputation stump. No fluid collection. No soft tissue mass. IMPRESSION: 1. Prior transmetatarsal amputation with soft tissue ulceration along the lateral aspect of the amputation stump. No abscess. 2. Minimal marrow edema along the dorsal aspect of the distal residual fifth metatarsal is nonspecific, although given its proximity to the soft tissue ulcer, early  osteomyelitis is not excluded. Electronically Signed   By: Obie Dredge M.D.   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 Large wound or ulcer lateral aspect of the foot at the level of fourth and fifth residual metatarsals. Suspected ulcer or wound lateral to the hindfoot. Vascular calcifications. Abundant soft tissue swelling. Periosteal thickening at the third fourth and fifth metatarsals. Gas surrounding the fifth metatarsal stump. Possible demineralization at the fifth metatarsal stump. 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 Large wound or ulcer lateral aspect of the foot at the level of fourth and fifth metatarsals. Abundant soft tissue swelling. Periosteal thickening at the third fourth and fifth metatarsals air some of which is chronic. Possible demineralization at the fifth metatarsal stump making it difficult to exclude osteomyelitis. Correlation with MRI is suggested. Electronically Signed   By: Jasmine Pang M.D.   On: 02/19/2023 16:06               LOS: 1 day   Jakyah Bradby  [facility]   Pager on [email or web address]. If 7PM-7AM, please contact night-coverage at [email or web address]     02/20/2023, 5:03 PM

## 2023-02-20 NOTE — TOC Progression Note (Signed)
Transition of Care [facility] - Progression Note    Patient Details  Name: [name] MRN: [id number] Date of Birth: [birth year]  [facility]) CM/SW Contact  Marlowe Sax, RN Phone Number: 02/20/2023, 10:31 AM  Clinical Narrative:    The patient comes in from home sent by PCP He went to [facility] last year after toe amputation  Consults pending for vascular and podiatry, TOC to follow for DC planning and assistance   Expected Discharge Plan:  (tbd) Barriers to Discharge: Continued Medical Work up  Expected Discharge Plan and Services   Discharge Planning Services: CM Consult   Living arrangements for the past 2 months: Single Family Home                                       Social Determinants of Health (SDOH) Interventions SDOH Screenings   Food Insecurity: No Food Insecurity (02/19/2023)  Housing: Low Risk  (02/19/2023)  Transportation Needs: No Transportation Needs (02/19/2023)  Utilities: Not At Risk (02/19/2023)  Tobacco Use: Medium Risk (02/19/2023)    Readmission Risk Interventions     No data to display

## 2023-02-20 NOTE — Consult Note (Signed)
Hospital Consult    Reason for Consult:  Right foot wound/ulcer Requesting Physician:  Dr Lurene Shadow MD MRN #:  [id number]  History of Present Illness: This is a 72 y.o. 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 He said he has had this ulcer since he has right transmetatarsal amputation in May 2023.    About 2 weeks ago, he noticed a foul smell from the right foot ulcer.  He also noticed that there was drainage from the ulcer.  He could not tell the color of the drainage because he said he cannot see the plantar aspect/bottom of his feet.  He noticed increased burning sensation on the right foot as well.  He was concerned about losing his foot so he presented to the emergency department for further management.   On exam today patient was resting comfortably in bed.  His right foot was wrapped with an Ace bandage which I did not remove.  Patient endorses he has had all of his toes amputated on his right foot prior.  He states his right foot is very painful at rest as well as walking.  Patient has no other complaints at this time.  Vitals all remain stable.  Past Medical History:  Diagnosis Date   Arthritis    Bladder incontinence    Chronic kidney disease    Stage 3 Kidney disease   Dementia (HCC)    Diabetes mellitus without complication (HCC)    DVT of leg (deep venous thrombosis) (HCC)    H/O RIGHT LEG 2018   Dyspnea    occassional   Hyperlipidemia    Hypertension    Peripheral vascular disease (HCC)    Sepsis (HCC)     Past Surgical History:  Procedure Laterality Date   ABDOMINAL AORTOGRAM W/LOWER EXTREMITY N/A  10/10/2016   Procedure: Abdominal Aortogram w/Lower Extremity;  Surgeon: Renford Dills, MD;  Location: [facility];  Service: Cardiovascular;  Laterality: N/A;   CATARACT EXTRACTION W/PHACO Left 05/27/2018   Procedure: CATARACT EXTRACTION PHACO AND INTRAOCULAR LENS PLACEMENT (IOC);  Surgeon: Nevada Crane, MD;  Location: [facility];  Service: Ophthalmology;  Laterality: Left;  [place] 03:06.2 AP% 16.1 CDE 30.16 FLUID PACK LOT @ [id number] H   CIRCUMCISION  1998   EYE SURGERY Left    Retina Detachment   GRAFT APPLICATION Right 05/29/2017   Procedure: GRAFT APPLICATION ( RIGHT FOOT );  Surgeon: Renford Dills, MD;  Location: [facility];  Service: Vascular;  Laterality: Right;  graft taken from patients right thigh   IRRIGATION AND DEBRIDEMENT FOOT Right 10/03/2016   Procedure: IRRIGATION AND DEBRIDEMENT FOOT;  Surgeon: Gwyneth Revels, DPM;  Location: [facility];  Service: Podiatry;  Laterality: Right;   LOWER EXTREMITY INTERVENTION  10/10/2016   Procedure: Lower Extremity Intervention;  Surgeon: Renford Dills, MD;  Location: [facility];  Service: Cardiovascular;;   PERIPHERAL VASCULAR BALLOON ANGIOPLASTY Left 10/10/2016   Procedure: Peripheral Vascular Balloon Angioplasty;  Surgeon: Renford Dills, MD;  Location: [facility];  Service: Cardiovascular;  Laterality: Left;   TONSILLECTOMY     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/2022   Procedure: TRANSMETATARSAL AMPUTATION;  Surgeon: Gwyneth Revels, DPM;  Location: [facility];  Service: Podiatry;  Laterality: Right;   WOUND DEBRIDEMENT Right 11/07/2016   Procedure: DEBRIDEMENT OF WOUND AND BONE RIGHT FOOT AND APPLY WOUND VAC;  Surgeon: Gwyneth Revels, DPM;  Location: [facility];  Service: Podiatry;  Laterality: Right;    Allergies  Allergen Reactions   Sulfa Antibiotics Rash   Zosyn [Piperacillin Sod-Tazobactam So] Rash    Prior to Admission medications   Medication Sig Start Date End Date Taking? Authorizing Provider   acetaminophen (TYLENOL) 325 MG tablet Take 650 mg by mouth every 6 (six) hours as needed.   Yes [provider]  atorvastatin (LIPITOR) 10 MG tablet Take 10 mg by mouth every evening. 06/12/20  Yes [provider]  FEROSUL 325 (65 Fe) MG tablet Take 325 mg by mouth 2 (two) times daily with a meal. 12/26/21  Yes [provider]  insulin glargine (LANTUS) 100 UNIT/ML injection Inject 0.15 mLs (15 Units total) into the skin at bedtime. 10/13/16  Yes Enid Baas, MD  insulin lispro (HUMALOG) 100 UNIT/ML injection Inject 4 Units into the skin See admin instructions. Inject 4 units SQ with breakfast, inject 4 units SQ with lunch and inject 4 units SQ with dinner   Yes [provider]  lisinopril (ZESTRIL) 5 MG tablet Take 1 tablet (5 mg total) by mouth daily. 01/08/22  Yes Enedina Finner, MD  metFORMIN (GLUCOPHAGE) 1000 MG tablet Take 1,000 mg by mouth 2 (two) times daily with a meal. 06/12/20  Yes [provider]  Multiple Vitamins-Minerals (CENTROVITE) TABS Take 1 tablet by mouth daily.   Yes [provider]  polyethylene glycol (MIRALAX / GLYCOLAX) packet Take 17 g by mouth daily.   Yes [provider]  senna (SENOKOT) 8.6 MG TABS tablet Take 1 tablet by mouth 2 (two) times daily.   Yes [provider]  Simethicone (GAS-X ULTRA STRENGTH PO) Take 1 capsule by mouth 4 (four) times daily as needed (for flatulence).   Yes [provider]  ACCU-CHEK GUIDE test strip Use 1 strip via meter four times a day  to monitor blood glucose 07/10/20   [provider]  Accu-Chek Softclix Lancets lancets  04/29/20   [provider]  docusate sodium (COLACE) 100 MG capsule Take 1 capsule (100 mg total) by mouth 2 (two) times daily. Patient not taking: Reported on 02/19/2023 10/13/16   Enid Baas, MD  DROPLET INSULIN SYRINGE 31G X 5/16" 0.3 ML MISC USE FIVE TIMES DAILY AS DIRECTED 06/12/20   [provider]   glucose blood (ACCU-CHEK AVIVA PLUS) test strip  04/29/20   [provider]  Insulin Syringe-Needle U-100 31G X 5/16" 0.3 ML MISC  06/05/20   [provider]    Social History   Socioeconomic History   Marital status: Single    Spouse name: Not on file   Number of children: Not on file   Years of education: Not on file   Highest education level: Not on file  Occupational History   Not on file  Tobacco Use   Smoking status: Former    Types: Pipe    Quit date: 05/23/1975    Years since quitting: 47.7   Smokeless tobacco: Never  Vaping Use   Vaping Use: Never used  Substance and Sexual Activity   Alcohol use: No   Drug use: No   Sexual activity: Not on file  Other Topics Concern   Not on file  Social History Narrative   Not on file   Social Determinants of Health  Financial Resource Strain: Not on file  Food Insecurity: No Food Insecurity (02/19/2023)   Hunger Vital Sign    Worried About Running Out of Food in the Last Year: Never true    Ran Out of Food in the Last Year: Never true  Transportation Needs: No Transportation Needs (02/19/2023)   PRAPARE - Administrator, Civil Service (Medical): No    Lack of Transportation (Non-Medical): No  Physical Activity: Not on file  Stress: Not on file  Social Connections: Not on file  Intimate Partner Violence: Not At Risk (02/19/2023)   Humiliation, Afraid, Rape, and Kick questionnaire    Fear of Current or Ex-Partner: No    Emotionally Abused: No    Physically Abused: No    Sexually Abused: No     Family History  Problem Relation Age of Onset   Diabetes Mother    Hypertension Mother    Diabetes Father    Hypertension Father     ROS: Otherwise negative unless mentioned in HPI  Physical Examination  Vitals:   02/20/23 0257 02/20/23 0734  BP: 104/61 120/66  Pulse: 80 78  Resp: 16 20  Temp: 98.6 F (37 C) 97.7 F (36.5 C)  SpO2: 97% 97%   Body mass index is 39.75  kg/m.  General:  WDWN in NAD Gait: Not observed HENT: WNL, normocephalic Pulmonary: normal non-labored breathing, without Rales, rhonchi,  wheezing Cardiac: regular, without  Murmurs, rubs or gallops; without carotid bruits Abdomen: Positive bowel sounds throughout, soft, NT/ND, no masses Skin: without rashes Vascular Exam/Pulses: Bilateral lower extremities +2 edema, unable to palpate pulses.  Both remain warm to touch.  Right lower extremity foot with bandage in place.  Clean dry and intact.  Slight tenderness with chronic erythematous changes on both lower extremities. Extremities: with ischemic changes, without Gangrene , without cellulitis; without open wounds;  Musculoskeletal: no muscle wasting or atrophy  Neurologic: A&O X 3;  No focal weakness or paresthesias are detected; speech is fluent/normal Psychiatric:  The pt has Normal affect. Lymph:  Unremarkable  CBC    Component Value Date/Time   WBC 5.3 02/19/2023 1327   RBC 3.95 (L) 02/19/2023 1327   HGB 11.7 (L) 02/19/2023 1327   HCT 36.1 (L) 02/19/2023 1327   PLT 332 02/19/2023 1327   MCV 91.4 02/19/2023 1327   MCH 29.6 02/19/2023 1327   MCHC 32.4 02/19/2023 1327   RDW 14.9 02/19/2023 1327   LYMPHSABS 1.2 02/19/2023 1327   MONOABS 0.8 02/19/2023 1327   EOSABS 0.2 02/19/2023 1327   BASOSABS 0.0 02/19/2023 1327    BMET    Component Value Date/Time   NA 136 02/20/2023 0505   NA 140 04/04/2013 1238   K 4.0 02/20/2023 0505   K 4.1 04/04/2013 1238   CL 108 02/20/2023 0505   CL 108 (H) 04/04/2013 1238   CO2 22 02/20/2023 0505   CO2 30 04/04/2013 1238   GLUCOSE 123 (H) 02/20/2023 0505   GLUCOSE 98 04/04/2013 1238   BUN 14 02/20/2023 0505   BUN 19 (H) 04/04/2013 1238   CREATININE 0.78 02/20/2023 0505   CREATININE 0.87 04/04/2013 1238   CALCIUM 8.1 (L) 02/20/2023 0505   CALCIUM 8.8 04/04/2013 1238   GFRNONAA >60 02/20/2023 0505   GFRNONAA >60 04/04/2013 1238   GFRAA >60 05/22/2017 0853   GFRAA >60 04/04/2013  1238    COAGS: Lab Results  Component Value Date   INR 1.1 01/03/2022   INR 2.28 05/22/2017   INR  1.00 11/30/2016     Non-Invasive Vascular Imaging:   EXAM:02/20/2023 MRI OF THE RIGHT FOREFOOT WITHOUT AND WITH CONTRAST   TECHNIQUE: Multiplanar, multisequence MR imaging of the right foot was performed before and after the administration of intravenous contrast.   CONTRAST:  10mL GADAVIST GADOBUTROL 1 MMOL/ML IV SOLN   COMPARISON:  Right foot x-rays from yesterday. MRI right foot dated Jan 03, 2022.   FINDINGS: Bones/Joint/Cartilage   Prior transmetatarsal amputation. Minimal marrow edema along the dorsal aspect of the distal residual fifth metatarsal (series 7, image 27). No fracture or dislocation. Midfoot degenerative changes. No joint effusion.   Ligaments   Lisfranc ligament is intact. Medial and lateral ankle ligaments are grossly intact.   Muscles and Tendons Postsurgical changes of the flexor and extensor tendons. No tenosynovitis. Complete fatty atrophy of the intrinsic muscles.   Soft tissue Diffuse soft tissue swelling. Soft tissue ulceration along lateral aspect of the amputation stump. No fluid collection. No soft tissue mass.   IMPRESSION: 1. Prior transmetatarsal amputation with soft tissue ulceration along the lateral aspect of the amputation stump. No abscess. 2. Minimal marrow edema along the dorsal aspect of the distal residual fifth metatarsal is nonspecific, although given its proximity to the soft tissue ulcer, early osteomyelitis is not excluded.  Statin:  Yes.   Beta Blocker:  No. Aspirin:  No. ACEI:  Yes.   ARB:  No. CCB use:  No Other antiplatelets/anticoagulants:  No.    ASSESSMENT/PLAN: This is a 72 y.o. male who presented to the hospital because of a nonhealing ulcer on the right foot. He said he has had this ulcer since he has right transmetatarsal amputation in May 2023. He has a history of right diabetic foot infection s/p  right transmetatarsal amputation in May 2023,   PLAN: Vascular surgery plans on taking the patient to the vascular lab on Tuesday, 02/24/2023 for a right lower extremity angiogram with possible intervention.  I discussed in detail with the patient the procedure, benefits, risks, and complications.  Patient verbalizes his understanding.  I answered all the patient's questions this afternoon.  Patient would like to proceed with the procedure as soon as possible.  Patient will be made n.p.o. Monday night at midnight.   -I discussed the plan in detail with Dr. Levora Dredge MD and he agrees with the plan.   Marcie Bal [facility] 02/20/2023 12:13 PM

## 2023-02-20 NOTE — Plan of Care (Signed)

## 2023-02-21 DIAGNOSIS — L089 Local infection of the skin and subcutaneous tissue, unspecified: Secondary | ICD-10-CM | POA: Diagnosis not present

## 2023-02-21 DIAGNOSIS — E11628 Type 2 diabetes mellitus with other skin complications: Secondary | ICD-10-CM | POA: Diagnosis not present

## 2023-02-21 LAB — GLUCOSE, CAPILLARY
Glucose-Capillary: 119 mg/dL — ABNORMAL HIGH (ref 70–99)
Glucose-Capillary: 110 mg/dL — ABNORMAL HIGH (ref 70–99)
Glucose-Capillary: 137 mg/dL — ABNORMAL HIGH (ref 70–99)
Glucose-Capillary: 101 mg/dL — ABNORMAL HIGH (ref 70–99)

## 2023-02-21 LAB — AEROBIC/ANAEROBIC CULTURE W GRAM STAIN (SURGICAL/DEEP WOUND)

## 2023-02-21 NOTE — Plan of Care (Signed)

## 2023-02-21 NOTE — Progress Notes (Addendum)
PODIATRY / FOOT AND ANKLE SURGERY PROGRESS NOTE  Requesting Physician: Dr. Myriam Forehand   Reason for consult: Right foot wound/possible infection  Chief Complaint: Right foot ulcer/infection   HPI: [name] is a 72 y.o. male who presents today resting in bed comfortably.  He has a dressing in place to his right foot that appears to be clean, dry, and intact.  He is scheduled to have upcoming CT angiogram with possible intervention on Tuesday with vascular surgery service.  He notes minimal pain to the right foot and ankle at this time believes he is doing pretty well.  PMHx:  Past Medical History:  Diagnosis Date   Arthritis    Bladder incontinence    Chronic kidney disease    Stage 3 Kidney disease   Dementia (HCC)    Diabetes mellitus without complication (HCC)    DVT of leg (deep venous thrombosis) (HCC)    H/O RIGHT LEG 2018   Dyspnea    occassional   Hyperlipidemia    Hypertension    Peripheral vascular disease (HCC)    Sepsis (HCC)     Surgical Hx:  Past Surgical History:  Procedure Laterality Date   ABDOMINAL AORTOGRAM W/LOWER EXTREMITY N/A 10/10/2016   Procedure: Abdominal Aortogram w/Lower Extremity;  Surgeon: Renford Dills, MD;  Location: [facility];  Service: Cardiovascular;  Laterality: N/A;   CATARACT EXTRACTION W/PHACO Left 05/27/2018   Procedure: CATARACT EXTRACTION PHACO AND INTRAOCULAR LENS PLACEMENT (IOC);  Surgeon: Nevada Crane, MD;  Location: [facility];  Service: Ophthalmology;  Laterality: Left;  [place] 03:06.2 AP% 16.1 CDE 30.16 FLUID PACK LOT @ [id number] H   CIRCUMCISION  1998   EYE SURGERY Left    Retina Detachment   GRAFT APPLICATION Right 05/29/2017   Procedure: GRAFT APPLICATION ( RIGHT FOOT );  Surgeon: Renford Dills, MD;  Location: [facility];  Service: Vascular;  Laterality: Right;  graft taken from patients right thigh   IRRIGATION AND DEBRIDEMENT FOOT Right 10/03/2016   Procedure: IRRIGATION AND DEBRIDEMENT FOOT;  Surgeon: Gwyneth Revels, DPM;  Location: [facility];  Service: Podiatry;  Laterality: Right;   LOWER EXTREMITY INTERVENTION  10/10/2016   Procedure: Lower Extremity Intervention;  Surgeon: Renford Dills, MD;  Location: [facility];  Service: Cardiovascular;;   PERIPHERAL VASCULAR BALLOON ANGIOPLASTY Left 10/10/2016   Procedure: Peripheral Vascular Balloon Angioplasty;  Surgeon: Renford Dills, MD;  Location: [facility];  Service: Cardiovascular;  Laterality: Left;   TONSILLECTOMY     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 His smoking use included pipe. He has never used smokeless tobacco. He reports that he does not drink alcohol and does not use drugs.  Allergies:  Allergies  Allergen Reactions   Sulfa Antibiotics Rash   Zosyn [Piperacillin Sod-Tazobactam So] Rash    Medications Prior to Admission  Medication Sig Dispense Refill   acetaminophen (TYLENOL) 325  MG tablet Take 650 mg by mouth every 6 (six) hours as needed.     atorvastatin (LIPITOR) 10 MG tablet Take 10 mg by mouth every evening.     FEROSUL 325 (65 Fe) MG tablet Take 325 mg by mouth 2 (two) times daily with a meal.     insulin glargine (LANTUS) 100 UNIT/ML injection Inject 0.15 mLs (15 Units total) into the skin at bedtime. 10 mL 11   insulin lispro (HUMALOG) 100 UNIT/ML injection Inject 4 Units into the skin See admin instructions. Inject 4 units SQ with breakfast, inject 4  units SQ with lunch and inject 4 units SQ with dinner     lisinopril (ZESTRIL) 5 MG tablet Take 1 tablet (5 mg total) by mouth daily. 30 tablet 3   metFORMIN (GLUCOPHAGE) 1000 MG tablet Take 1,000 mg by mouth 2 (two) times daily with a meal.     Multiple Vitamins-Minerals (CENTROVITE) TABS Take 1 tablet by mouth daily.     polyethylene glycol (MIRALAX / GLYCOLAX) packet Take 17 g by mouth daily.     senna (SENOKOT) 8.6 MG TABS tablet Take 1 tablet by mouth 2 (two) times daily.     Simethicone (GAS-X ULTRA STRENGTH PO) Take 1 capsule by mouth 4 (four) times daily as needed (for flatulence).     ACCU-CHEK GUIDE test strip Use 1 strip via meter four times a day  to monitor blood glucose     Accu-Chek Softclix Lancets lancets      docusate sodium (COLACE) 100 MG capsule Take 1 capsule (100 mg total) by mouth 2 (two) times daily. (Patient not taking: Reported on 02/19/2023) 10 capsule 0   DROPLET INSULIN SYRINGE 31G X 5/16" 0.3 ML MISC USE FIVE TIMES DAILY AS DIRECTED     glucose blood (ACCU-CHEK AVIVA PLUS) test strip      Insulin Syringe-Needle U-100 31G X 5/16" 0.3 ML MISC       Physical Exam: General: Alert and oriented.  No apparent distress.  Vascular: DP/PT pulses nonpalpable bilateral.  Capillary fill time appears to be intact to digits of the left foot and flap to the right foot, no hair growth present to bilateral lower extremities.  Moderate to severe right lower extremity nonpitting edema, mild to moderate left lower extremity.  Erythema appears to be apparent to the anterior lateral ankle extending at the dorsal lateral foot.  Neuro: Light touch sensation reduced to bilateral lower extremities.  Derm:  Open ulcerations present to the dorsal lateral and plantar lateral aspects of the residual remaining fifth metatarsal distally.  Dorsal ulceration measures approximately 2.5 x 0.5 x 0.3 cm.  Plantar lateral ulceration measures approximately 1 cm x 1 cm x 0.3 cm.  Wound beds appear to be  fibrogranular at this time with hyperkeratotic buildup around the periphery.  Mild edema, minimal erythema, mild serous drainage.  3 scattered pinpoint ulcerations present to the dorsal lateral foot extending up the lateral ankle area, wound beds appear to be fibrogranular overall with mild edema present to the area, minimal erythema, mild serous drainage, minimal odor.  MSK: Right transmetatarsal amputation.  Results for orders placed or performed during the hospital encounter of 02/19/23 (from the past 48 hour(s))  Lactic acid, plasma     Status: None   Collection Time: 02/19/23  1:19 PM  Result Value Ref Range   Lactic Acid, Venous 1.4 0.5 - 1.9 mmol/L    Comment: Performed at [facility], [address] [place], [place] [address]  CBC with Differential     Status: Abnormal   Collection Time: 02/19/23  1:27 PM  Result Value Ref Range   WBC 5.3 4.0 - 10.5 K/uL   RBC 3.95 (L) 4.22 - 5.81 MIL/uL   Hemoglobin 11.7 (L) 13.0 - 17.0 g/dL   HCT 60.4 (L) 54.0 - 98.1 %   MCV 91.4 80.0 - 100.0 fL   MCH 29.6 26.0 - 34.0 pg   MCHC 32.4 30.0 - 36.0 g/dL   RDW 19.1 47.8 - 29.5 %   Platelets 332 150 - 400 K/uL   nRBC 0.0 0.0 - 0.2 %   Neutrophils Relative % 58 %   Neutro Abs 3.1 1.7 - 7.7 K/uL   Lymphocytes Relative 22 %   Lymphs Abs 1.2 0.7 - 4.0 K/uL   Monocytes Relative 16 %   Monocytes Absolute 0.8 0.1 - 1.0 K/uL   Eosinophils Relative 3 %   Eosinophils Absolute 0.2 0.0 - 0.5 K/uL   Basophils Relative 1 %   Basophils Absolute 0.0 0.0 - 0.1 K/uL   Immature Granulocytes 0 %   Abs Immature Granulocytes 0.01 0.00 - 0.07 K/uL    Comment: Performed at [facility], [address], [place], [place] [address]  Basic metabolic panel     Status: Abnormal   Collection Time: 02/19/23  1:27 PM  Result Value Ref Range   Sodium 138 135 - 145 mmol/L   Potassium 4.8 3.5 - 5.1 mmol/L   Chloride 106 98 - 111 mmol/L   CO2 24 22 - 32 mmol/L   Glucose, Bld 120 (H) 70 - 99 mg/dL     Comment: Glucose reference range applies only to samples taken after fasting for at least 8 hours.   BUN 17 8 - 23 mg/dL   Creatinine, Ser 8.65 0.61 - 1.24 mg/dL   Calcium 9.2 8.9 - 78.4 mg/dL   GFR, Estimated >69 >62 mL/min    Comment: (NOTE) Calculated using the CKD-EPI Creatinine Equation (2021)    Anion gap 8 5 - 15    Comment: Performed at [facility], [address] [place], [place] [address]  Hemoglobin A1c     Status: Abnormal   Collection Time: 02/19/23  1:27 PM  Result Value Ref Range   Hgb A1c MFr Bld 6.4 (H) 4.8 - 5.6 %    Comment: (NOTE) Pre diabetes:          5.7%-6.4%  Diabetes:              >6.4%  Glycemic control for   <7.0% adults with diabetes    Mean Plasma Glucose 136.98 mg/dL    Comment: Performed at [facility], [address], [place], [place] [address]  Lactic acid, plasma     Status: Abnormal   Collection Time: 02/19/23  1:28 PM  Result Value Ref Range   Lactic Acid, Venous 2.1 (HH) 0.5 - 1.9 mmol/L    Comment: CRITICAL RESULT CALLED TO, READ BACK BY AND VERIFIED WITH BRANDY [facility] 02/19/23 1404 MW Performed at [facility], [address] [place], [place] [address]   CBG monitoring, ED     Status: Abnormal   Collection Time: 02/19/23  3:34 PM  Result Value Ref Range   Glucose-Capillary 127 (H) 70 - 99 mg/dL    Comment: Glucose reference range applies only to samples taken after fasting for at least 8 hours.  Culture, blood (routine x 2)     Status: None (Preliminary result)  Collection Time: 02/19/23  5:29 PM   Specimen: BLOOD  Result Value Ref Range   Specimen Description BLOOD BLOOD RIGHT FOREARM    Special Requests      BOTTLES DRAWN AEROBIC AND ANAEROBIC Blood Culture results may not be optimal due to an excessive volume of blood received in culture bottles   Culture      NO GROWTH 2 DAYS Performed at [facility], [address], [place], [place] [address]    Report Status PENDING   Culture,  blood (routine x 2)     Status: None (Preliminary result)   Collection Time: 02/19/23  5:31 PM   Specimen: BLOOD  Result Value Ref Range   Specimen Description BLOOD BLOOD RIGHT ARM    Special Requests      BOTTLES DRAWN AEROBIC AND ANAEROBIC Blood Culture adequate volume   Culture      NO GROWTH 2 DAYS Performed at [facility], [address], [place], [place] [address]    Report Status PENDING   Glucose, capillary     Status: Abnormal   Collection Time: 02/19/23 11:14 PM  Result Value Ref Range   Glucose-Capillary 175 (H) 70 - 99 mg/dL    Comment: Glucose reference range applies only to samples taken after fasting for at least 8 hours.  Basic metabolic panel     Status: Abnormal   Collection Time: 02/20/23  5:05 AM  Result Value Ref Range   Sodium 136 135 - 145 mmol/L   Potassium 4.0 3.5 - 5.1 mmol/L   Chloride 108 98 - 111 mmol/L   CO2 22 22 - 32 mmol/L   Glucose, Bld 123 (H) 70 - 99 mg/dL    Comment: Glucose reference range applies only to samples taken after fasting for at least 8 hours.   BUN 14 8 - 23 mg/dL   Creatinine, Ser 0.98 0.61 - 1.24 mg/dL   Calcium 8.1 (L) 8.9 - 10.3 mg/dL   GFR, Estimated >11 >91 mL/min    Comment: (NOTE) Calculated using the CKD-EPI Creatinine Equation (2021)    Anion gap 6 5 - 15    Comment: Performed at [facility], [address] [place], [place] [address]  Lactic acid, plasma     Status: None   Collection Time: 02/20/23  5:05 AM  Result Value Ref Range   Lactic Acid, Venous 0.8 0.5 - 1.9 mmol/L    Comment: Performed at [facility], [address] [place], [place] [address]  Sedimentation rate     Status: Abnormal   Collection Time: 02/20/23  7:19 AM  Result Value Ref Range   Sed Rate 40 (H) 0 - 20 mm/hr    Comment: Performed at [facility], [address] [place], [place] [address]  C-reactive protein     Status: None   Collection Time: 02/20/23  7:19 AM  Result Value Ref Range   CRP  0.6 <1.0 mg/dL    Comment: Performed at [facility], [address], [place], [place] [address]  Glucose, capillary     Status: Abnormal   Collection Time: 02/20/23  7:36 AM  Result Value Ref Range   Glucose-Capillary 116 (H) 70 - 99 mg/dL    Comment: Glucose reference range applies only to samples taken after fasting for at least 8 hours.  Glucose, capillary     Status: Abnormal   Collection Time: 02/20/23 11:55 AM  Result Value Ref Range   Glucose-Capillary 151 (H) 70 -  99 mg/dL    Comment: Glucose reference range applies only to samples taken after fasting for at least 8 hours.  Glucose, capillary     Status: Abnormal   Collection Time: 02/20/23  6:37 PM  Result Value Ref Range   Glucose-Capillary 213 (H) 70 - 99 mg/dL    Comment: Glucose reference range applies only to samples taken after fasting for at least 8 hours.  Glucose, capillary     Status: Abnormal   Collection Time: 02/20/23  9:04 PM  Result Value Ref Range   Glucose-Capillary 137 (H) 70 - 99 mg/dL    Comment: Glucose reference range applies only to samples taken after fasting for at least 8 hours.  Glucose, capillary     Status: Abnormal   Collection Time: 02/21/23  8:00 AM  Result Value Ref Range   Glucose-Capillary 119 (H) 70 - 99 mg/dL    Comment: Glucose reference range applies only to samples taken after fasting for at least 8 hours.   MR FOOT RIGHT W WO CONTRAST  Result Date: 02/20/2023 CLINICAL DATA:  Right foot ulcer. History of transmetatarsal amputation. EXAM: MRI OF THE RIGHT FOREFOOT WITHOUT AND WITH CONTRAST TECHNIQUE: Multiplanar, multisequence MR imaging of the right foot was performed before and after the administration of intravenous contrast. CONTRAST:  10mL GADAVIST GADOBUTROL 1 MMOL/ML IV SOLN COMPARISON:  Right foot x-rays from yesterday. MRI right foot dated Jan 03, 2022. FINDINGS: Bones/Joint/Cartilage Prior transmetatarsal amputation. Minimal marrow edema along the dorsal aspect of the  distal residual fifth metatarsal (series 7, image 27). No fracture or dislocation. Midfoot degenerative changes. No joint effusion. Ligaments Lisfranc ligament is intact. Medial and lateral ankle ligaments are grossly intact. Muscles and Tendons Postsurgical changes of the flexor and extensor tendons. No tenosynovitis. Complete fatty atrophy of the intrinsic muscles. Soft tissue Diffuse soft tissue swelling. Soft tissue ulceration along lateral aspect of the amputation stump. No fluid collection. No soft tissue mass. IMPRESSION: 1. Prior transmetatarsal amputation with soft tissue ulceration along the lateral aspect of the amputation stump. No abscess. 2. Minimal marrow edema along the dorsal aspect of the distal residual fifth metatarsal is nonspecific, although given its proximity to the soft tissue ulcer, early osteomyelitis is not excluded. Electronically Signed   By: Obie Dredge M.D.   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 Large wound or ulcer lateral aspect of the foot at the level of fourth and fifth residual metatarsals. Suspected ulcer or wound lateral to the hindfoot. Vascular calcifications. Abundant soft tissue swelling. Periosteal thickening at the third fourth and fifth metatarsals. Gas surrounding the fifth metatarsal stump. Possible demineralization at the fifth metatarsal stump. 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 Large wound or ulcer lateral aspect of the foot at the level of fourth and fifth metatarsals. Abundant soft tissue swelling. Periosteal thickening at the third fourth and fifth metatarsals air some of which is chronic. Possible demineralization at the fifth metatarsal stump making it difficult to exclude osteomyelitis. Correlation with MRI is  suggested. Electronically Signed   By: Jasmine Pang M.D.   On: 02/19/2023 16:06    Blood pressure 115/64, pulse 76, temperature 98 F (36.7 C), resp. rate 18, height 5\' 11"  (1.803 m), weight 129.3 kg, SpO2 97 %.  Assessment Right foot cellulitis secondary to diabetic foot  ulceration Venous insufficiency PVD Diabetes type 2 with polyneuropathy  Plan -Patient seen and examined -MRI and x-ray imaging reviewed.  No definitive evidence for osteomyelitis, no abscess.  White blood cell count within normal limits, ESR elevated but less than it was when he had bone infection previously.  CRP within normal limits. -Wound appears to probe close to bone near the fifth metatarsal distally at the level of the amputation site but appears to have coverage still at this time, no obvious bone exposed, mild serous drainage present from the wounds with mild odor.  Proximal lateral foot wounds appear to be stable at this time with minimal serous drainage. -Wound debridements performed as described below.  Patient tolerated well. -Applied Betadine wet-to-dry dressing to the area with compression from the forefoot to below knee.  Recommend changing daily for now.  Appears to be improving his swelling in his leg. -Appreciate vascular recommendations.  Plan for CT angiogram early next week. -Wound culture taken and sent off.  Still awaiting result. -Postop shoe ordered for right foot. -Prevalon boot ordered for right foot/ankle.  Patient to wear this when resting in bed or chair to keep pressure off the outside of the foot.  Patient tends to rest his foot and externally rotated position which likely causes increased pressure to the lateral column which could be leading to the pressure type ulceration.  Podiatry team will monitor peripherally.  Will likely check back in on patient on Tuesday or Wednesday for another evaluation.  Appears to be doing well could consider discharge with oral antibiotics at that time based  on culture results.  Debridment of ulcer: Location: Dorsal lateral right foot Pre-debridement measurement: 2.5 x 0.5 x 0.3 cm Post-debridement measurement: Same Tissue removed:   Hyperkeratotic tissue, biofilm, fibrous tissue Ulcer was debrided sharply with combination of tissue nippers and scalpel blade into the subcutaneous tissue  Debridment of ulcer: Location: Plantar lateral right foot Pre-debridement measurement: 1 cm x 1 cm x 0.3 cm Post-debridement measurement: Same Tissue removed:   Hyperkeratotic tissue, biofilm, fibrous tissue Ulcer was debrided sharply with combination of tissue nippers and scalpel blade into the subcutaneous tissue  Rosetta Posner, DPM 02/21/2023, 8:33 AM

## 2023-02-21 NOTE — Progress Notes (Signed)
Progress Note    [name]  YQI:[id number] DOB: October 22, 1950  DOA: 02/19/2023 PCP: Abram Sander, MD      Brief Narrative:    Medical records reviewed and are as summarized below:  [name] is a 72 y.o. 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  He said he has had this ulcer since he has right transmetatarsal amputation in May 2023.  About 2 weeks ago, he noticed a foul smell from the right foot ulcer.  He also noticed that there was drainage from the ulcer.  He could not tell the color of the drainage because he said he cannot see the plantar aspect/bottom of his feet.  He noticed increased burning sensation on the right foot as well.  He was concerned about losing his foot so he presented to the emergency department for further management.  He reported diarrhea about 2 weeks ago and this was followed by constipation few days later.    He was admitted to the hospital for right diabetic foot ulcer with infection and suspected osteomyelitis of the right foot.    Assessment/Plan:   Principal Problem:   Diabetic infection of right foot (HCC) Active Problems:   Hyperlipidemia   Chronic venous insufficiency   Hypertension   Obesity   Body mass index is 39.75 kg/m.  (Obesity)   Right diabetic foot ulcer with infection: S/p right foot wound debridement on 02/21/2023.  Follow-up wound culture.  No growth on blood cultures thus far.  Continue IV cefepime and vancomycin..  Follow-up with podiatrist. CRP was normal on ESR was only slightly elevated at 40.    Insulin-dependent diabetes mellitus: Continue  insulin glargine 15 units nightly, NovoLog 10 units 3 times daily with meals and NovoLog as needed for hyperglycemia. Patient said he takes metformin, Lantus 30 units nightly and Humalog 15 units 3 times daily with meals.  Hemoglobin A1c 6.4.    PVD: Plan for lower extremity angiogram on 02/24/2023.  Follow-up with vascular surgeon.     Lactic acidosis: Resolved.    Stage II decubitus ulcers on bilateral buttocks, stage II decubitus ulcers on bilateral hips: All present on admission.  Continue local wound care.      Other comorbidities include hypertension, hyperlipidemia, obesity, chronic venous insufficiency    Diet Order             Diet Carb Modified Fluid consistency: Thin; Room service appropriate? Yes  Diet effective now                            Consultants: Podiatrist Vascular surgeon  Procedures: S/p wound debridement on 02/20/2023    Medications:    heparin  5,000 Units Subcutaneous Q8H   insulin aspart  0-9 Units Subcutaneous TID WC   insulin aspart  10 Units Subcutaneous TID WC   insulin glargine-yfgn  15 Units Subcutaneous QHS   Continuous Infusions:  ceFEPime (MAXIPIME) IV 2 g (02/21/23 0500)   vancomycin 1,250 mg (02/21/23 1108)     Anti-infectives (From admission, onward)    Start     Dose/Rate Route Frequency Ordered Stop   02/20/23 2200  vancomycin (VANCOREADY) IVPB  2000 mg/400 mL  Status:  Discontinued        2,000 mg 200 mL/hr over 120 Minutes Intravenous Every 24 hours 02/19/23 1754 02/20/23 0940   02/20/23 1100  vancomycin (VANCOREADY) IVPB 1250 mg/250 mL        1,250 mg 166.7 mL/hr over 90 Minutes Intravenous Every 12 hours 02/20/23 0940     02/19/23 2000  ceFEPIme (MAXIPIME) 2 g in sodium chloride 0.9 % 100 mL IVPB        2 g 200 mL/hr over 30 Minutes Intravenous Every 8 hours 02/19/23 1749     02/19/23 1900  vancomycin (VANCOREADY) IVPB 2000 mg/400 mL        2,000 mg 200 mL/hr over 120 Minutes Intravenous  Once  02/19/23 1749 02/20/23 0107   02/19/23 1715  ceFEPIme (MAXIPIME) 1 g in sodium chloride 0.9 % 100 mL IVPB  Status:  Discontinued        1 g 200 mL/hr over 30 Minutes Intravenous  Once 02/19/23 1711 02/19/23 1747   02/19/23 1715  vancomycin (VANCOCIN) IVPB 1000 mg/200 mL premix  Status:  Discontinued        1,000 mg 200 mL/hr over 60 Minutes Intravenous  Once 02/19/23 1711 02/19/23 1747              Family Communication/Anticipated D/C date and plan/Code Status   DVT prophylaxis: heparin injection 5,000 Units Start: 02/19/23 2200     Code Status: Full Code  Family Communication: None Disposition Plan: Plan to charge home in 4 to 5 days   Status is: Inpatient Remains inpatient appropriate because: Right foot infection on IV antibiotics       Subjective:   Interval events noted.  He has no complaints.  Pain in right foot is better.  Dressing on right foot was changed by podiatrist this morning.  Objective:    Vitals:   02/20/23 0734 02/20/23 1605 02/20/23 2344 02/21/23 0758  BP: 120/66 129/67 (!) 112/56 115/64  Pulse: 78 79 81 76  Resp: 20 18 20 18   Temp: 97.7 F (36.5 C) 97.9 F (36.6 C) 99.5 F (37.5 C) 98 F (36.7 C)  TempSrc: Oral     SpO2: 97% 99% 96% 97%  Weight:      Height:       No data found.   Intake/Output Summary (Last 24 hours) at 02/21/2023 1150 Last data filed at 02/21/2023 1125 Gross per 24 hour  Intake 1032.99 ml  Output 3775 ml  Net -2742.01 ml   Filed Weights   02/19/23 1324  Weight: 129.3 kg    Exam:  GEN: NAD SKIN: Warm and dry EYES: EOMI ENT: MMM CV: RRR PULM: CTA B ABD: soft, obese, NT, +BS CNS: AAO x 3, non focal EXT: Dressing on right foot is clean, dry and intact.   Pressure Injury 02/19/23 Buttocks Right;Left Stage 2 -  Partial thickness loss of dermis presenting as a shallow open injury with a red, pink wound bed without slough. Red/pink wound bed exposed (Active)  02/19/23 2000  Location: Buttocks   Location Orientation: Right;Left  Staging: Stage 2 -  Partial thickness loss of dermis presenting as a shallow open injury with a red, pink wound bed without slough.  Wound Description (Comments): Red/pink wound bed exposed  Present on Admission: Yes  Dressing Type Foam - Lift dressing to assess site every shift 02/21/23 0810     Pressure Injury 02/19/23 Hip Anterior;Right Stage 2 -  Partial thickness loss of dermis  presenting as a shallow open injury with a red, pink wound bed without slough. red/pink wound bed (Active)  02/19/23 2000  Location: Hip  Location Orientation: Anterior;Right  Staging: Stage 2 -  Partial thickness loss of dermis presenting as a shallow open injury with a red, pink wound bed without slough.  Wound Description (Comments): red/pink wound bed  Present on Admission: Yes  Dressing Type Foam - Lift dressing to assess site every shift 02/21/23 0810     Pressure Injury 02/19/23 Hip Anterior;Left Stage 2 -  Partial thickness loss of dermis presenting as a shallow open injury with a red, pink wound bed without slough. red/pink wound base (Active)  02/19/23 2000  Location: Hip  Location Orientation: Anterior;Left  Staging: Stage 2 -  Partial thickness loss of dermis presenting as a shallow open injury with a red, pink wound bed without slough.  Wound Description (Comments): red/pink wound base  Present on Admission: Yes  Dressing Type Foam - Lift dressing to assess site every shift 02/21/23 0810     Data Reviewed:   I have personally reviewed following labs and imaging studies:  Labs: Labs show the following:   Basic Metabolic Panel: Recent Labs  Lab 02/19/23 1327 02/20/23 0505  NA 138 136  K 4.8 4.0  CL 106 108  CO2 24 22  GLUCOSE 120* 123*  BUN 17 14  CREATININE 1.06 0.78  CALCIUM 9.2 8.1*   GFR Estimated Creatinine Clearance: 114.4 mL/min (by C-G formula based on SCr of 0.78 mg/dL). Liver Function Tests: No results for input(s): "AST", "ALT",  "ALKPHOS", "BILITOT", "PROT", "ALBUMIN" in the last 168 hours. No results for input(s): "LIPASE", "AMYLASE" in the last 168 hours. No results for input(s): "AMMONIA" in the last 168 hours. Coagulation profile No results for input(s): "INR", "PROTIME" in the last 168 hours.  CBC: Recent Labs  Lab 02/19/23 1327  WBC 5.3  NEUTROABS 3.1  HGB 11.7*  HCT 36.1*  MCV 91.4  PLT 332   Cardiac Enzymes: No results for input(s): "CKTOTAL", "CKMB", "CKMBINDEX", "TROPONINI" in the last 168 hours. BNP (last 3 results) No results for input(s): "PROBNP" in the last 8760 hours. CBG: Recent Labs  Lab 02/20/23 1155 02/20/23 1837 02/20/23 2104 02/21/23 0800 02/21/23 1124  GLUCAP 151* 213* 137* 119* 110*   D-Dimer: No results for input(s): "DDIMER" in the last 72 hours. Hgb A1c: Recent Labs    02/19/23 1327  HGBA1C 6.4*   Lipid Profile: No results for input(s): "CHOL", "HDL", "LDLCALC", "TRIG", "CHOLHDL", "LDLDIRECT" in the last 72 hours. Thyroid function studies: No results for input(s): "TSH", "T4TOTAL", "T3FREE", "THYROIDAB" in the last 72 hours.  Invalid input(s): "FREET3" Anemia work up: No results for input(s): "VITAMINB12", "FOLATE", "FERRITIN", "TIBC", "IRON", "RETICCTPCT" in the last 72 hours. Sepsis Labs: Recent Labs  Lab 02/19/23 1319 02/19/23 1327 02/19/23 1328 02/20/23 0505  WBC  --  5.3  --   --   LATICACIDVEN 1.4  --  2.1* 0.8    Microbiology Recent Results (from the past 240 hour(s))  Culture, blood (routine x 2)     Status: None (Preliminary result)   Collection Time: 02/19/23  5:29 PM   Specimen: BLOOD  Result Value Ref Range Status   Specimen Description BLOOD BLOOD RIGHT FOREARM  Final   Special Requests   Final    BOTTLES DRAWN AEROBIC AND ANAEROBIC Blood Culture results may not be optimal due to an excessive volume of blood received in culture bottles   Culture  Final    NO GROWTH 2 DAYS Performed at [facility], [address]  [place], [place] [address]    Report Status PENDING  Incomplete  Culture, blood (routine x 2)     Status: None (Preliminary result)   Collection Time: 02/19/23  5:31 PM   Specimen: BLOOD  Result Value Ref Range Status   Specimen Description BLOOD BLOOD RIGHT ARM  Final   Special Requests   Final    BOTTLES DRAWN AEROBIC AND ANAEROBIC Blood Culture adequate volume   Culture   Final    NO GROWTH 2 DAYS Performed at [facility], [address], [place], [place] [address]    Report Status PENDING  Incomplete    Procedures and diagnostic studies:  MR FOOT RIGHT W WO CONTRAST  Result Date: 02/20/2023 CLINICAL DATA:  Right foot ulcer. History of transmetatarsal amputation. EXAM: MRI OF THE RIGHT FOREFOOT WITHOUT AND WITH CONTRAST TECHNIQUE: Multiplanar, multisequence MR imaging of the right foot was performed before and after the administration of intravenous contrast. CONTRAST:  10mL GADAVIST GADOBUTROL 1 MMOL/ML IV SOLN COMPARISON:  Right foot x-rays from yesterday. MRI right foot dated Jan 03, 2022. FINDINGS: Bones/Joint/Cartilage Prior transmetatarsal amputation. Minimal marrow edema along the dorsal aspect of the distal residual fifth metatarsal (series 7, image 27). No fracture or dislocation. Midfoot degenerative changes. No joint effusion. Ligaments Lisfranc ligament is intact. Medial and lateral ankle ligaments are grossly intact. Muscles and Tendons Postsurgical changes of the flexor and extensor tendons. No tenosynovitis. Complete fatty atrophy of the intrinsic muscles. Soft tissue Diffuse soft tissue swelling. Soft tissue ulceration along lateral aspect of the amputation stump. No fluid collection. No soft tissue mass. IMPRESSION: 1. Prior transmetatarsal amputation with soft tissue ulceration along the lateral aspect of the amputation stump. No abscess. 2. Minimal marrow edema along the dorsal aspect of the distal residual fifth metatarsal is nonspecific, although given its proximity  to the soft tissue ulcer, early osteomyelitis is not excluded. Electronically Signed   By: Obie Dredge M.D.   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 Large wound or ulcer lateral aspect of the foot at the level of fourth and fifth residual metatarsals. Suspected ulcer or wound lateral to the hindfoot. Vascular calcifications. Abundant soft tissue swelling. Periosteal thickening at the third fourth and fifth metatarsals. Gas surrounding the fifth metatarsal stump. Possible demineralization at the fifth metatarsal stump. 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 Large wound or ulcer lateral aspect of the foot at the level of fourth and fifth metatarsals. Abundant soft tissue swelling. Periosteal thickening at the third fourth and fifth metatarsals air some of which is chronic. Possible demineralization at the fifth metatarsal stump making it difficult to exclude osteomyelitis. Correlation with MRI is suggested. Electronically Signed   By: Jasmine Pang M.D.   On: 02/19/2023 16:06               LOS: 2 days   Dore Oquin  [facility]   Pager on [email or web address]. If 7PM-7AM, please contact night-coverage at [email or web address]     02/21/2023, 11:50 AM

## 2023-02-22 DIAGNOSIS — L089 Local infection of the skin and subcutaneous tissue, unspecified: Secondary | ICD-10-CM | POA: Diagnosis not present

## 2023-02-22 DIAGNOSIS — E11628 Type 2 diabetes mellitus with other skin complications: Secondary | ICD-10-CM | POA: Diagnosis not present

## 2023-02-22 LAB — GLUCOSE, CAPILLARY
Glucose-Capillary: 102 mg/dL — ABNORMAL HIGH (ref 70–99)
Glucose-Capillary: 174 mg/dL — ABNORMAL HIGH (ref 70–99)
Glucose-Capillary: 93 mg/dL (ref 70–99)
Glucose-Capillary: 195 mg/dL — ABNORMAL HIGH (ref 70–99)

## 2023-02-22 MED ORDER — ORAL CARE MOUTH RINSE: 15.0000 mL | OROMUCOSAL | Status: DC | PRN

## 2023-02-22 MED ORDER — DOXYCYCLINE HYCLATE 100 MG PO TABS
100.0000 mg | ORAL_TABLET | Freq: Two times a day (BID) | ORAL | Status: DC
  Administered 2023-02-22 – 2023-02-24 (×4): 100 mg via ORAL
  Filled 2023-02-22 (×4): qty 1

## 2023-02-22 MED ORDER — CEFADROXIL 500 MG PO CAPS
500.0000 mg | ORAL_CAPSULE | Freq: Two times a day (BID) | ORAL | Status: DC
  Administered 2023-02-22 – 2023-02-24 (×5): 500 mg via ORAL
  Filled 2023-02-22 (×6): qty 1

## 2023-02-22 NOTE — Progress Notes (Signed)
Progress Note    [name]  ZOX:[id number] DOB: 09-06-1950  DOA: 02/19/2023 PCP: Abram Sander, MD      Brief Narrative:    Medical records reviewed and are as summarized below:  [name] is a 72 y.o. 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  He said he has had this ulcer since he has right transmetatarsal amputation in May 2023.  About 2 weeks ago, he noticed a foul smell from the right foot ulcer.  He also noticed that there was drainage from the ulcer.  He could not tell the color of the drainage because he said he cannot see the plantar aspect/bottom of his feet.  He noticed increased burning sensation on the right foot as well.  He was concerned about losing his foot so he presented to the emergency department for further management.  He reported diarrhea about 2 weeks ago and this was followed by constipation few days later.    He was admitted to the hospital for right diabetic foot ulcer with infection and suspected osteomyelitis of the right foot.    Assessment/Plan:   Principal Problem:   Diabetic infection of right foot (HCC) Active Problems:   Hyperlipidemia   Chronic venous insufficiency   Hypertension   Obesity   Body mass index is 39.75 kg/m.  (Obesity)   Right diabetic foot ulcer with infection: S/p right foot wound debridement on 02/21/2023.  Follow-up wound culture.  No growth on blood cultures thus far.  Discontinue IV cefepime and vancomycin.  Start doxycycline and cefadroxil.  Follow-up with podiatrist. CRP was normal on ESR was only slightly elevated at 40.     Insulin-dependent diabetes mellitus: Continue insulin glargine 15 units nightly, NovoLog 10 units 3 times daily with meals and NovoLog as needed for hyperglycemia. Patient said he takes metformin, Lantus 30 units nightly and Humalog 15 units 3 times daily with meals.  Hemoglobin A1c 6.4.    PVD: Plan for lower extremity angiogram on 02/24/2023.  Follow-up with vascular surgeon.     Lactic acidosis: Resolved.    Stage II decubitus ulcers on bilateral buttocks, stage II decubitus ulcers on bilateral hips: All present on admission.  Continue local wound care.      Other comorbidities include hypertension, hyperlipidemia, obesity, chronic venous insufficiency    Diet Order             Diet Carb Modified Fluid consistency: Thin; Room service appropriate? Yes  Diet effective now                            Consultants: Podiatrist Vascular surgeon  Procedures: S/p wound debridement on 02/20/2023    Medications:    cefadroxil  500 mg Oral BID   doxycycline  100 mg Oral Q12H   heparin  5,000 Units Subcutaneous Q8H   insulin aspart  0-9 Units Subcutaneous TID WC   insulin aspart  10 Units Subcutaneous TID WC   insulin glargine-yfgn  15 Units Subcutaneous QHS   Continuous Infusions:     Anti-infectives (From admission, onward)    Start     Dose/Rate Route Frequency Ordered Stop  02/22/23 2200  doxycycline (VIBRA-TABS) tablet 100 mg        100 mg Oral Every 12 hours 02/22/23 1153     02/22/23 1245  cefadroxil (DURICEF) capsule 500 mg        500 mg Oral 2 times daily 02/22/23 1153     02/20/23 2200  vancomycin (VANCOREADY) IVPB 2000 mg/400 mL  Status:  Discontinued        2,000 mg 200 mL/hr over 120 Minutes Intravenous Every 24 hours 02/19/23 1754 02/20/23 0940   02/20/23 1100  vancomycin (VANCOREADY) IVPB 1250 mg/250 mL  Status:  Discontinued        1,250 mg 166.7 mL/hr over 90 Minutes Intravenous Every 12 hours 02/20/23 0940 02/22/23 1154   02/19/23  2000  ceFEPIme (MAXIPIME) 2 g in sodium chloride 0.9 % 100 mL IVPB  Status:  Discontinued        2 g 200 mL/hr over 30 Minutes Intravenous Every 8 hours 02/19/23 1749 02/22/23 1149   02/19/23 1900  vancomycin (VANCOREADY) IVPB 2000 mg/400 mL        2,000 mg 200 mL/hr over 120 Minutes Intravenous  Once 02/19/23 1749 02/20/23 0107   02/19/23 1715  ceFEPIme (MAXIPIME) 1 g in sodium chloride 0.9 % 100 mL IVPB  Status:  Discontinued        1 g 200 mL/hr over 30 Minutes Intravenous  Once 02/19/23 1711 02/19/23 1747   02/19/23 1715  vancomycin (VANCOCIN) IVPB 1000 mg/200 mL premix  Status:  Discontinued        1,000 mg 200 mL/hr over 60 Minutes Intravenous  Once 02/19/23 1711 02/19/23 1747              Family Communication/Anticipated D/C date and plan/Code Status   DVT prophylaxis: heparin injection 5,000 Units Start: 02/19/23 2200     Code Status: Full Code  Family Communication: None Disposition Plan: Plan to charge home in 3 to 4 days   Status is: Inpatient Remains inpatient appropriate because: Right foot infection on IV antibiotics       Subjective:   No acute events overnight.  No complaints.  Pain in right foot is better.  Objective:    Vitals:   02/21/23 1642 02/21/23 2318 02/22/23 0821 02/22/23 1529  BP: (!) 108/56 119/67 112/65 110/85  Pulse: 96 88 80 81  Resp: 18 20 16 18   Temp: 97.9 F (36.6 C) 97.8 F (36.6 C) 97.8 F (36.6 C) 97.7 F (36.5 C)  TempSrc:      SpO2: 98% 98% 98% 99%  Weight:      Height:       No data found.   Intake/Output Summary (Last 24 hours) at 02/22/2023 1538 Last data filed at 02/22/2023 1433 Gross per 24 hour  Intake 1417.01 ml  Output 2680 ml  Net -1262.99 ml   Filed Weights   02/19/23 1324  Weight: 129.3 kg    Exam:  GEN: NAD SKIN: Warm and dry EYES: EOMI ENT: MMM CV: RRR PULM: CTA B ABD: soft, obese, NT, +BS CNS: AAO x 3, non focal EXT: Dressing on right foot is clean, dry and intact     Data  Reviewed:   I have personally reviewed following labs and imaging studies:  Labs: Labs show the following:   Basic Metabolic Panel: Recent Labs  Lab 02/19/23 1327 02/20/23 0505  NA 138 136  K 4.8 4.0  CL 106 108  CO2 24 22  GLUCOSE 120* 123*  BUN 17  14  CREATININE 1.06 0.78  CALCIUM 9.2 8.1*   GFR Estimated Creatinine Clearance: 114.4 mL/min (by C-G formula based on SCr of 0.78 mg/dL). Liver Function Tests: No results for input(s): "AST", "ALT", "ALKPHOS", "BILITOT", "PROT", "ALBUMIN" in the last 168 hours. No results for input(s): "LIPASE", "AMYLASE" in the last 168 hours. No results for input(s): "AMMONIA" in the last 168 hours. Coagulation profile No results for input(s): "INR", "PROTIME" in the last 168 hours.  CBC: Recent Labs  Lab 02/19/23 1327  WBC 5.3  NEUTROABS 3.1  HGB 11.7*  HCT 36.1*  MCV 91.4  PLT 332   Cardiac Enzymes: No results for input(s): "CKTOTAL", "CKMB", "CKMBINDEX", "TROPONINI" in the last 168 hours. BNP (last 3 results) No results for input(s): "PROBNP" in the last 8760 hours. CBG: Recent Labs  Lab 02/21/23 1124 02/21/23 1720 02/21/23 2130 02/22/23 0810 02/22/23 1120  GLUCAP 110* 137* 101* 102* 174*   D-Dimer: No results for input(s): "DDIMER" in the last 72 hours. Hgb A1c: No results for input(s): "HGBA1C" in the last 72 hours.  Lipid Profile: No results for input(s): "CHOL", "HDL", "LDLCALC", "TRIG", "CHOLHDL", "LDLDIRECT" in the last 72 hours. Thyroid function studies: No results for input(s): "TSH", "T4TOTAL", "T3FREE", "THYROIDAB" in the last 72 hours.  Invalid input(s): "FREET3" Anemia work up: No results for input(s): "VITAMINB12", "FOLATE", "FERRITIN", "TIBC", "IRON", "RETICCTPCT" in the last 72 hours. Sepsis Labs: Recent Labs  Lab 02/19/23 1319 02/19/23 1327 02/19/23 1328 02/20/23 0505  WBC  --  5.3  --   --   LATICACIDVEN 1.4  --  2.1* 0.8    Microbiology Recent Results (from the past 240 hour(s))   Culture, blood (routine x 2)     Status: None (Preliminary result)   Collection Time: 02/19/23  5:29 PM   Specimen: BLOOD  Result Value Ref Range Status   Specimen Description BLOOD BLOOD RIGHT FOREARM  Final   Special Requests   Final    BOTTLES DRAWN AEROBIC AND ANAEROBIC Blood Culture results may not be optimal due to an excessive volume of blood received in culture bottles   Culture   Final    NO GROWTH 3 DAYS Performed at [facility], [address] [place], [place] [address]    Report Status PENDING  Incomplete  Culture, blood (routine x 2)     Status: None (Preliminary result)   Collection Time: 02/19/23  5:31 PM   Specimen: BLOOD  Result Value Ref Range Status   Specimen Description BLOOD BLOOD RIGHT ARM  Final   Special Requests   Final    BOTTLES DRAWN AEROBIC AND ANAEROBIC Blood Culture adequate volume   Culture   Final    NO GROWTH 3 DAYS Performed at [facility], [address] [place], [place] [address]    Report Status PENDING  Incomplete  Aerobic/Anaerobic Culture w Gram Stain (surgical/deep wound)     Status: None (Preliminary result)   Collection Time: 02/21/23  8:24 AM   Specimen: Wound  Result Value Ref Range Status   Specimen Description   Final    WOUND Performed at [facility], [address], [place], [place] [address]    Special Requests   Final    NONE Performed at [facility], [address] [place], [place] [address]    Gram Stain   Final    FEW SQUAMOUS EPITHELIAL CELLS PRESENT ABUNDANT GRAM POSITIVE RODS ABUNDANT GRAM POSITIVE COCCI ABUNDANT GRAM NEGATIVE RODS    Culture  Final    CULTURE REINCUBATED FOR BETTER GROWTH Performed at [facility], [address] [place], [place] [address]    Report Status PENDING  Incomplete    Procedures and diagnostic studies:  No results found.             LOS: 3 days   Vonetta Foulk  [facility]   Pager on [email or web address].  If 7PM-7AM, please contact night-coverage at [email or web address]     02/22/2023, 3:38 PM

## 2023-02-22 NOTE — Plan of Care (Signed)

## 2023-02-23 DIAGNOSIS — E11628 Type 2 diabetes mellitus with other skin complications: Secondary | ICD-10-CM | POA: Diagnosis not present

## 2023-02-23 DIAGNOSIS — L089 Local infection of the skin and subcutaneous tissue, unspecified: Secondary | ICD-10-CM | POA: Diagnosis not present

## 2023-02-23 LAB — GLUCOSE, CAPILLARY
Glucose-Capillary: 130 mg/dL — ABNORMAL HIGH (ref 70–99)
Glucose-Capillary: 147 mg/dL — ABNORMAL HIGH (ref 70–99)
Glucose-Capillary: 158 mg/dL — ABNORMAL HIGH (ref 70–99)
Glucose-Capillary: 188 mg/dL — ABNORMAL HIGH (ref 70–99)

## 2023-02-23 NOTE — Care Management Important Message (Signed)
Important Message  Patient Details  Name: [name] MRN: [id number] Date of Birth: 1951/07/15   Medicare Important Message Given:  N/A - LOS <3 / Initial given by admissions     Olegario Messier A Casmira Cramer 02/23/2023, 9:19 AM

## 2023-02-23 NOTE — Progress Notes (Signed)
   02/23/23 1600  Spiritual Encounters  Type of Visit Initial  Care provided to: Patient  Reason for visit Routine spiritual support  OnCall Visit No  Spiritual Framework  Presenting Themes Courage hope and growth  Community/Connection Family  Interventions  Spiritual Care Interventions Made Established relationship of care and support;Prayer;Compassionate presence;Reflective listening;Encouragement  Intervention Outcomes  Outcomes Connection to spiritual care  Spiritual Care Plan  Spiritual Care Issues Still Outstanding No further spiritual care needs at this time (see row info)   Chaplain provided spiritual support with compassionate presence. Pleasant mood and affect. Chaplain available for spiritual support as need arises.

## 2023-02-23 NOTE — Progress Notes (Signed)
Progress Note    [name]  ZOX:[id number] DOB: [birth year]  DOA: 02/19/2023 PCP: Abram Sander, MD      Brief Narrative:    Medical records reviewed and are as summarized below:  [name] is a 72 y.o. 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  He said he has had this ulcer since he has right transmetatarsal amputation in May 2023.  About 2 weeks ago, he noticed a foul smell from the right foot ulcer.  He also noticed that there was drainage from the ulcer.  He could not tell the color of the drainage because he said he cannot see the plantar aspect/bottom of his feet.  He noticed increased burning sensation on the right foot as well.  He was concerned about losing his foot so he presented to the emergency department for further management.  He reported diarrhea about 2 weeks ago and this was followed by constipation few days later.    He was admitted to the hospital for right diabetic foot ulcer with infection and suspected osteomyelitis of the right foot.    Assessment/Plan:   Principal Problem:   Diabetic infection of right foot (HCC) Active Problems:   Hyperlipidemia   Chronic venous insufficiency   Hypertension   Obesity   Body mass index is 39.75 kg/m.  (Obesity)   Right diabetic foot ulcer with infection: S/p right foot wound debridement on 02/21/2023.  No growth on wound cultures thus far.  Continue doxycycline and cefadroxil.  Follow-up with podiatrist. CRP was normal on ESR was only slightly elevated at 40.    Insulin-dependent diabetes mellitus: Glucose levels have been optimal.   Continue insulin glargine 15 units nightly, NovoLog 10 units 3 times daily with meals and NovoLog as needed for hyperglycemia. Patient said he takes metformin, Lantus 30 units nightly and Humalog 15 units 3 times daily with meals.  Hemoglobin A1c 6.4.    PVD: Plan for lower extremity angiogram tomorrow..  Follow-up with vascular surgeon.     Lactic acidosis: Resolved.    Stage II decubitus ulcers on bilateral buttocks, stage II decubitus ulcers on bilateral hips: All present on admission.  Continue local wound care.      Other comorbidities include hypertension, hyperlipidemia, obesity, chronic venous insufficiency    Diet Order             Diet Carb Modified Fluid consistency: Thin; Room service appropriate? Yes  Diet effective now                            Consultants: Podiatrist Vascular surgeon  Procedures: S/p wound debridement on 02/20/2023    Medications:    cefadroxil  500 mg Oral BID   doxycycline  100 mg Oral Q12H   heparin  5,000 Units Subcutaneous Q8H   insulin aspart  0-9 Units Subcutaneous TID WC   insulin aspart  10 Units Subcutaneous TID WC   insulin glargine-yfgn  15 Units Subcutaneous QHS   Continuous Infusions:     Anti-infectives (From admission, onward)    Start     Dose/Rate Route Frequency Ordered Stop   02/22/23 2200  doxycycline (VIBRA-TABS)  tablet 100 mg        100 mg Oral Every 12 hours 02/22/23 1153     02/22/23 1245  cefadroxil (DURICEF) capsule 500 mg        500 mg Oral 2 times daily 02/22/23 1153     02/20/23 2200  vancomycin (VANCOREADY) IVPB 2000 mg/400 mL  Status:  Discontinued        2,000 mg 200 mL/hr over 120 Minutes Intravenous Every 24 hours 02/19/23 1754 02/20/23 0940   02/20/23 1100  vancomycin (VANCOREADY) IVPB 1250 mg/250 mL  Status:  Discontinued        1,250 mg 166.7 mL/hr over 90 Minutes Intravenous Every 12 hours 02/20/23 0940 02/22/23 1154   02/19/23 2000  ceFEPIme (MAXIPIME) 2 g in sodium  chloride 0.9 % 100 mL IVPB  Status:  Discontinued        2 g 200 mL/hr over 30 Minutes Intravenous Every 8 hours 02/19/23 1749 02/22/23 1149   02/19/23 1900  vancomycin (VANCOREADY) IVPB 2000 mg/400 mL        2,000 mg 200 mL/hr over 120 Minutes Intravenous  Once 02/19/23 1749 02/20/23 0107   02/19/23 1715  ceFEPIme (MAXIPIME) 1 g in sodium chloride 0.9 % 100 mL IVPB  Status:  Discontinued        1 g 200 mL/hr over 30 Minutes Intravenous  Once 02/19/23 1711 02/19/23 1747   02/19/23 1715  vancomycin (VANCOCIN) IVPB 1000 mg/200 mL premix  Status:  Discontinued        1,000 mg 200 mL/hr over 60 Minutes Intravenous  Once 02/19/23 1711 02/19/23 1747              Family Communication/Anticipated D/C date and plan/Code Status   DVT prophylaxis: heparin injection 5,000 Units Start: 02/19/23 2200     Code Status: Full Code  Family Communication: None Disposition Plan: Plan to discharge home in 2 to 3 days   Status is: Inpatient Remains inpatient appropriate because: Awaiting right lower extremity angiogram       Subjective:   Interval events noted.  He has no complaints.  No pain in the right foot.  Objective:    Vitals:   02/22/23 0821 02/22/23 1529 02/23/23 0011 02/23/23 0735  BP: 112/65 110/85 110/60 128/72  Pulse: 80 81 79 98  Resp: 16 18 (!) 24 20  Temp: 97.8 F (36.6 C) 97.7 F (36.5 C) 98.3 F (36.8 C) 97.6 F (36.4 C)  TempSrc:    Oral  SpO2: 98% 99% 98% 98%  Weight:      Height:       No data found.   Intake/Output Summary (Last 24 hours) at 02/23/2023 1117 Last data filed at 02/23/2023 1000 Gross per 24 hour  Intake 480 ml  Output 2090 ml  Net -1610 ml   Filed Weights   02/19/23 1324  Weight: 129.3 kg    Exam:  GEN: NAD SKIN: No rash EYES: EOMI ENT: MMM CV: RRR PULM: CTA B ABD: soft, obese, NT, +BS CNS: AAO x 3, non focal EXT: Right foot dressing is clean, dry and intact.  No drainage noted.     Data Reviewed:   I have  personally reviewed following labs and imaging studies:  Labs: Labs show the following:   Basic Metabolic Panel: Recent Labs  Lab 02/19/23 1327 02/20/23 0505  NA 138 136  K 4.8 4.0  CL 106 108  CO2 24 22  GLUCOSE 120* 123*  BUN 17 14  CREATININE  1.06 0.78  CALCIUM 9.2 8.1*   GFR Estimated Creatinine Clearance: 114.4 mL/min (by C-G formula based on SCr of 0.78 mg/dL). Liver Function Tests: No results for input(s): "AST", "ALT", "ALKPHOS", "BILITOT", "PROT", "ALBUMIN" in the last 168 hours. No results for input(s): "LIPASE", "AMYLASE" in the last 168 hours. No results for input(s): "AMMONIA" in the last 168 hours. Coagulation profile No results for input(s): "INR", "PROTIME" in the last 168 hours.  CBC: Recent Labs  Lab 02/19/23 1327  WBC 5.3  NEUTROABS 3.1  HGB 11.7*  HCT 36.1*  MCV 91.4  PLT 332   Cardiac Enzymes: No results for input(s): "CKTOTAL", "CKMB", "CKMBINDEX", "TROPONINI" in the last 168 hours. BNP (last 3 results) No results for input(s): "PROBNP" in the last 8760 hours. CBG: Recent Labs  Lab 02/22/23 0810 02/22/23 1120 02/22/23 1656 02/22/23 2116 02/23/23 0801  GLUCAP 102* 174* 93 195* 130*   D-Dimer: No results for input(s): "DDIMER" in the last 72 hours. Hgb A1c: No results for input(s): "HGBA1C" in the last 72 hours.  Lipid Profile: No results for input(s): "CHOL", "HDL", "LDLCALC", "TRIG", "CHOLHDL", "LDLDIRECT" in the last 72 hours. Thyroid function studies: No results for input(s): "TSH", "T4TOTAL", "T3FREE", "THYROIDAB" in the last 72 hours.  Invalid input(s): "FREET3" Anemia work up: No results for input(s): "VITAMINB12", "FOLATE", "FERRITIN", "TIBC", "IRON", "RETICCTPCT" in the last 72 hours. Sepsis Labs: Recent Labs  Lab 02/19/23 1319 02/19/23 1327 02/19/23 1328 02/20/23 0505  WBC  --  5.3  --   --   LATICACIDVEN 1.4  --  2.1* 0.8    Microbiology Recent Results (from the past 240 hour(s))  Culture, blood (routine x  2)     Status: None (Preliminary result)   Collection Time: 02/19/23  5:29 PM   Specimen: BLOOD  Result Value Ref Range Status   Specimen Description BLOOD BLOOD RIGHT FOREARM  Final   Special Requests   Final    BOTTLES DRAWN AEROBIC AND ANAEROBIC Blood Culture results may not be optimal due to an excessive volume of blood received in culture bottles   Culture   Final    NO GROWTH 4 DAYS Performed at [facility], [address], [place], [place] [address]    Report Status PENDING  Incomplete  Culture, blood (routine x 2)     Status: None (Preliminary result)   Collection Time: 02/19/23  5:31 PM   Specimen: BLOOD  Result Value Ref Range Status   Specimen Description BLOOD BLOOD RIGHT ARM  Final   Special Requests   Final    BOTTLES DRAWN AEROBIC AND ANAEROBIC Blood Culture adequate volume   Culture   Final    NO GROWTH 4 DAYS Performed at [facility], [address] [place], [place] [address]    Report Status PENDING  Incomplete  Aerobic/Anaerobic Culture w Gram Stain (surgical/deep wound)     Status: None (Preliminary result)   Collection Time: 02/21/23  8:24 AM   Specimen: Wound  Result Value Ref Range Status   Specimen Description   Final    WOUND Performed at [facility], [address], [place], [place] [address]    Special Requests   Final    NONE Performed at [facility], [address], [place], [place] [address]    Gram Stain   Final    FEW SQUAMOUS EPITHELIAL CELLS PRESENT ABUNDANT GRAM POSITIVE RODS ABUNDANT GRAM POSITIVE COCCI ABUNDANT GRAM NEGATIVE RODS    Culture   Final  CULTURE REINCUBATED FOR BETTER GROWTH Performed at [facility], [address], [place], [place] [address]    Report Status PENDING  Incomplete    Procedures and diagnostic studies:  No results found.             LOS: 4 days   Lakshmi Sundeen  [facility]   Pager on [email or web address]. If 7PM-7AM, please contact  night-coverage at [email or web address]     02/23/2023, 11:17 AM

## 2023-02-23 NOTE — Plan of Care (Signed)

## 2023-02-24 ENCOUNTER — Encounter
Admission: EM | Disposition: A | Discharge status: Home / Self Care | Payer: Self-pay | Source: Home / Self Care | Attending: Emergency Medicine

## 2023-02-24 ENCOUNTER — Encounter: Payer: Self-pay | Admitting: Vascular Surgery

## 2023-02-24 DIAGNOSIS — I70235 Atherosclerosis of native arteries of right leg with ulceration of other part of foot: Secondary | ICD-10-CM

## 2023-02-24 DIAGNOSIS — E11628 Type 2 diabetes mellitus with other skin complications: Secondary | ICD-10-CM | POA: Diagnosis not present

## 2023-02-24 DIAGNOSIS — I708 Atherosclerosis of other arteries: Secondary | ICD-10-CM

## 2023-02-24 DIAGNOSIS — L97519 Non-pressure chronic ulcer of other part of right foot with unspecified severity: Secondary | ICD-10-CM

## 2023-02-24 DIAGNOSIS — L089 Local infection of the skin and subcutaneous tissue, unspecified: Secondary | ICD-10-CM | POA: Diagnosis not present

## 2023-02-24 DIAGNOSIS — I70202 Unspecified atherosclerosis of native arteries of extremities, left leg: Secondary | ICD-10-CM

## 2023-02-24 HISTORY — PX: LOWER EXTREMITY ANGIOGRAPHY: CATH118251

## 2023-02-24 LAB — CULTURE, BLOOD (ROUTINE X 2)
Culture: NO GROWTH
Culture: NO GROWTH
Special Requests: ADEQUATE

## 2023-02-24 LAB — GLUCOSE, CAPILLARY
Glucose-Capillary: 130 mg/dL — ABNORMAL HIGH (ref 70–99)
Glucose-Capillary: 128 mg/dL — ABNORMAL HIGH (ref 70–99)
Glucose-Capillary: 112 mg/dL — ABNORMAL HIGH (ref 70–99)
Glucose-Capillary: 192 mg/dL — ABNORMAL HIGH (ref 70–99)

## 2023-02-24 LAB — AEROBIC/ANAEROBIC CULTURE W GRAM STAIN (SURGICAL/DEEP WOUND)

## 2023-02-24 SURGERY — LOWER EXTREMITY ANGIOGRAPHY
Anesthesia: Moderate Sedation | Laterality: Right

## 2023-02-24 MED ORDER — CIPROFLOXACIN HCL 500 MG PO TABS: 500.0000 mg | ORAL_TABLET | Freq: Two times a day (BID) | ORAL | 0 refills | Status: AC

## 2023-02-24 MED ORDER — SODIUM CHLORIDE 0.9 % IV SOLN: 250.0000 mL | INTRAVENOUS | Status: DC | PRN

## 2023-02-24 MED ORDER — SODIUM CHLORIDE 0.9% FLUSH
3.0000 mL | Freq: Two times a day (BID) | INTRAVENOUS | Status: DC
  Administered 2023-02-24: 3 mL via INTRAVENOUS

## 2023-02-24 MED ORDER — METHYLPREDNISOLONE SODIUM SUCC 125 MG IJ SOLR: 125.0000 mg | Freq: Once | INTRAMUSCULAR | Status: DC | PRN

## 2023-02-24 MED ORDER — DIPHENHYDRAMINE HCL 50 MG/ML IJ SOLN: 50.0000 mg | Freq: Once | INTRAMUSCULAR | Status: DC | PRN

## 2023-02-24 MED ORDER — HEPARIN SODIUM (PORCINE) 1000 UNIT/ML IJ SOLN
INTRAMUSCULAR | Status: AC
  Filled 2023-02-24: qty 10

## 2023-02-24 MED ORDER — LIDOCAINE HCL (PF) 1 % IJ SOLN
INTRAMUSCULAR | Status: DC | PRN
  Administered 2023-02-24: 10 mL

## 2023-02-24 MED ORDER — MORPHINE SULFATE (PF) 4 MG/ML IV SOLN: 2.0000 mg | INTRAVENOUS | Status: DC | PRN

## 2023-02-24 MED ORDER — MIDAZOLAM HCL 2 MG/2ML IJ SOLN
INTRAMUSCULAR | Status: DC | PRN
  Administered 2023-02-24: 1 mg via INTRAVENOUS
  Administered 2023-02-24 (×2): .5 mg via INTRAVENOUS
  Administered 2023-02-24: 1 mg via INTRAVENOUS

## 2023-02-24 MED ORDER — LABETALOL HCL 5 MG/ML IV SOLN: 10.0000 mg | INTRAVENOUS | Status: DC | PRN

## 2023-02-24 MED ORDER — FENTANYL CITRATE (PF) 100 MCG/2ML IJ SOLN
INTRAMUSCULAR | Status: DC | PRN
  Administered 2023-02-24: 25 ug via INTRAVENOUS
  Administered 2023-02-24: 12.5 ug via INTRAVENOUS
  Administered 2023-02-24: 50 ug via INTRAVENOUS

## 2023-02-24 MED ORDER — ONDANSETRON HCL 4 MG/2ML IJ SOLN: 4.0000 mg | Freq: Four times a day (QID) | INTRAMUSCULAR | Status: DC | PRN

## 2023-02-24 MED ORDER — CLINDAMYCIN HCL 300 MG PO CAPS: 300.0000 mg | ORAL_CAPSULE | Freq: Three times a day (TID) | ORAL | 0 refills | Status: AC

## 2023-02-24 MED ORDER — FAMOTIDINE 20 MG PO TABS: 40.0000 mg | ORAL_TABLET | Freq: Once | ORAL | Status: DC | PRN

## 2023-02-24 MED ORDER — MIDAZOLAM HCL 2 MG/ML PO SYRP: 8.0000 mg | ORAL_SOLUTION | Freq: Once | ORAL | Status: DC | PRN

## 2023-02-24 MED ORDER — SODIUM CHLORIDE 0.9% FLUSH: 3.0000 mL | INTRAVENOUS | Status: DC | PRN

## 2023-02-24 MED ORDER — VANCOMYCIN HCL IN DEXTROSE 1-5 GM/200ML-% IV SOLN
INTRAVENOUS | Status: AC
  Filled 2023-02-24: qty 200

## 2023-02-24 MED ORDER — SODIUM CHLORIDE 0.9 % IV SOLN: INTRAVENOUS | Status: DC

## 2023-02-24 MED ORDER — HYDROMORPHONE HCL 1 MG/ML IJ SOLN: 1.0000 mg | Freq: Once | INTRAMUSCULAR | Status: DC | PRN

## 2023-02-24 MED ORDER — FENTANYL CITRATE (PF) 100 MCG/2ML IJ SOLN
INTRAMUSCULAR | Status: AC
  Filled 2023-02-24: qty 2

## 2023-02-24 MED ORDER — OXYCODONE HCL 5 MG PO TABS: 5.0000 mg | ORAL_TABLET | ORAL | Status: DC | PRN

## 2023-02-24 MED ORDER — IODIXANOL 320 MG/ML IV SOLN
INTRAVENOUS | Status: DC | PRN
  Administered 2023-02-24: 50 mL

## 2023-02-24 MED ORDER — MIDAZOLAM HCL 5 MG/5ML IJ SOLN
INTRAMUSCULAR | Status: AC
  Filled 2023-02-24: qty 5

## 2023-02-24 MED ORDER — SODIUM CHLORIDE 0.9 % IV SOLN: INTRAVENOUS | Status: AC

## 2023-02-24 MED ORDER — HYDRALAZINE HCL 20 MG/ML IJ SOLN: 5.0000 mg | INTRAMUSCULAR | Status: DC | PRN

## 2023-02-24 MED ORDER — ACETAMINOPHEN 325 MG PO TABS: 650.0000 mg | ORAL_TABLET | ORAL | Status: DC | PRN

## 2023-02-24 MED ORDER — HEPARIN SODIUM (PORCINE) 1000 UNIT/ML IJ SOLN
INTRAMUSCULAR | Status: DC | PRN
  Administered 2023-02-24: 6000 [IU] via INTRAVENOUS

## 2023-02-24 MED ORDER — VANCOMYCIN HCL 1500 MG/300ML IV SOLN
1500.0000 mg | INTRAVENOUS | Status: DC
  Administered 2023-02-24: 1500 mg via INTRAVENOUS
  Filled 2023-02-24: qty 300

## 2023-02-24 MED ORDER — CLINDAMYCIN HCL 300 MG PO CAPS: 300.0000 mg | ORAL_CAPSULE | Freq: Three times a day (TID) | ORAL | 0 refills | Status: DC

## 2023-02-24 MED ORDER — CIPROFLOXACIN HCL 500 MG PO TABS: 500.0000 mg | ORAL_TABLET | Freq: Two times a day (BID) | ORAL | 0 refills | Status: DC

## 2023-02-24 MED ORDER — HEPARIN (PORCINE) IN NACL 1000-0.9 UT/500ML-% IV SOLN
INTRAVENOUS | Status: DC | PRN
  Administered 2023-02-24: 1000 mL

## 2023-02-24 MED ORDER — FENTANYL CITRATE PF 50 MCG/ML IJ SOSY: 12.5000 ug | PREFILLED_SYRINGE | Freq: Once | INTRAMUSCULAR | Status: DC | PRN

## 2023-02-24 SURGICAL SUPPLY — 23 items
CATH ANGIO 5F PIGTAIL 65CM (CATHETERS) IMPLANT
CATH MICROCATH PRGRT 2.8F 110 (CATHETERS) IMPLANT
CATH TEMPO 5F RIM 65CM (CATHETERS) IMPLANT
CATH VS15FR (CATHETERS) IMPLANT
COVER PROBE ULTRASOUND 5X96 (MISCELLANEOUS) IMPLANT
DEVICE STARCLOSE SE CLOSURE (Vascular Products) IMPLANT
GLIDECATH 4FR STR (CATHETERS) IMPLANT
GLIDEWIRE ADV .035X260CM (WIRE) IMPLANT
GLIDEWIRE ANGLED SS 035X260CM (WIRE) IMPLANT
GOWN STRL REUS W/ TWL LRG LVL3 (GOWN DISPOSABLE) ×1 IMPLANT
GOWN STRL REUS W/TWL LRG LVL3 (GOWN DISPOSABLE) ×1
GUIDEWIRE ANGLED .035X260CM (WIRE) IMPLANT
GUIDEWIRE PFTE-COATED .018X300 (WIRE) IMPLANT
MICROCATH PROGREAT 2.8F 110 CM (CATHETERS) ×1 IMPLANT
NDL ENTRY 21GA 7CM ECHOTIP (NEEDLE) IMPLANT
NEEDLE ENTRY 21GA 7CM ECHOTIP (NEEDLE) ×1 IMPLANT
PACK ANGIOGRAPHY (CUSTOM PROCEDURE TRAY) ×1 IMPLANT
SET INTRO CAPELLA COAXIAL (SET/KITS/TRAYS/PACK) IMPLANT
SHEATH BRITE TIP 5FRX11 (SHEATH) IMPLANT
SYR MEDRAD MARK 7 150ML (SYRINGE) IMPLANT
TUBING CONTRAST HIGH PRESS 72 (TUBING) IMPLANT
WIRE G V18X300CM (WIRE) IMPLANT
WIRE GUIDERIGHT .035X150 (WIRE) IMPLANT

## 2023-02-24 NOTE — Interval H&P Note (Signed)
History and Physical Interval Note:  02/24/2023 10:46 AM  [name]  has presented today for surgery, with the diagnosis of PAD.  The various methods of treatment have been discussed with the patient and family. After consideration of risks, benefits and other options for treatment, the patient has consented to  Procedure(s): Lower Extremity Angiography (Right) as a surgical intervention.  The patient's history has been reviewed, patient examined, no change in status, stable for surgery.  I have reviewed the patient's chart and labs.  Questions were answered to the patient's satisfaction.     [name]

## 2023-02-24 NOTE — Progress Notes (Cosign Needed)
CBG at 1315 was 112 mg/dl per NT Alayna. No insulin required, will inform [facility].  Isac Sarna Student PN

## 2023-02-24 NOTE — Plan of Care (Signed)
  Problem: Activity: Goal: Risk for activity intolerance will decrease Outcome: Progressing   Problem: Safety: Goal: Ability to remain free from injury will improve Outcome: Progressing   Problem: Skin Integrity: Goal: Risk for impaired skin integrity will decrease Outcome: Progressing   

## 2023-02-24 NOTE — Discharge Summary (Signed)
Physician Discharge Summary   Patient: [name] MRN: [id number] DOB: 28-May-1951  Admit date:     02/19/2023  Discharge date: 02/24/23  Discharge Physician: Lurene Shadow   PCP: Abram Sander, MD   Recommendations at discharge:   Follow-up with PCP in 1 week Follow-up with Dr. Ether Griffins, podiatrist, in 2 weeks  Discharge Diagnoses: Principal Problem:   Diabetic infection of right foot (HCC) Active Problems:   Hyperlipidemia   Chronic venous insufficiency   Hypertension   Obesity  Resolved Problems:   * No resolved hospital problems. [facility] Course:  [name] is a 72 y.o. 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  He said he has had this ulcer since he has right transmetatarsal amputation in May 2023.  About 2 weeks ago, he noticed a foul smell from the right foot ulcer.  He also noticed that there was drainage from the ulcer.  He could not tell the color of the drainage because he said he cannot see the plantar aspect/bottom of his feet.  He noticed increased burning sensation on the right foot as well.  He was concerned about losing his foot so he presented to the emergency department for further management.  He reported diarrhea about 2 weeks ago and this was followed by constipation few days later.      He was admitted to the hospital for right diabetic foot ulcer with infection.  Osteomyelitis was ruled out by podiatrist.  Assessment and Plan:  Right diabetic foot ulcer with infection: S/p right foot wound debridement on 02/21/2023.  Wound culture showed Enterobacter cloacae, Serratia marcescens,  Bacteroides fragilis and Staphylococcus aureus.  Sensitivity report for Staphylococcus aureus is still pending.  Reengaged podiatrist (secure chat sent to Dr. Ether Griffins and Dr. Excell Seltzer).  Dr. Excell Seltzer recommended Augmentin and ciprofloxacin for 7 days.  Patient found to have allergy to Zosyn (penicillin).  Patient will be discharged on clindamycin and ciprofloxacin for 7 days. Discharge plan discussed with Dr. Ether Griffins.  He will follow-up sensitivity reports for Staph aureus and adjust antibiotic as needed. CRP was normal on ESR was only slightly elevated at 40. Of note, patient is allergic to Bactrim which is effective against both Enterobacter cloacae and Serratia marcescens.     Insulin-dependent diabetes mellitus: Resume Lantus 30 units nightly, Humalog NovoLog 15 units 3 times daily with meals and metformin at discharge.  Hemoglobin A1c 6.4.     PVD: S/p right lower extremity angiogram on 02/24/2023.  There was no evidence of hemodynamically significant stenosis.     Lactic acidosis: Resolved.      Stage II decubitus ulcers on bilateral buttocks, stage II decubitus ulcers on bilateral hips: All present on admission.  Continue local wound care.       Other comorbidities include hypertension, hyperlipidemia, obesity, chronic venous insufficiency     His condition has improved and is deemed stable for discharge to home today.  Discharge plan was discussed with the patient and he verbalized understanding.         Consultants: Podiatrist, vascular surgeon Procedures performed: S/p wound debridement on 02/20/2023 S/p right lower extremity angiogram 02/24/2023  Disposition: Home Diet recommendation:  Discharge Diet Orders (From admission, onward)  Start     Ordered   02/24/23 0000  Diet - low sodium heart healthy        02/24/23 1728   02/24/23 0000  Diet Carb Modified        02/24/23 1728           Cardiac and Carb modified diet DISCHARGE MEDICATION: Allergies as of 02/24/2023        Reactions   Sulfa Antibiotics Rash   Zosyn [piperacillin Sod-tazobactam So] Rash        Medication List     STOP taking these medications    docusate sodium 100 MG capsule Commonly known as: COLACE       TAKE these medications    Accu-Chek Aviva Plus test strip Generic drug: glucose blood   Accu-Chek Guide test strip Generic drug: glucose blood Use 1 strip via meter four times a day  to monitor blood glucose   Accu-Chek Softclix Lancets lancets   acetaminophen 325 MG tablet Commonly known as: TYLENOL Take 650 mg by mouth every 6 (six) hours as needed.   atorvastatin 10 MG tablet Commonly known as: LIPITOR Take 10 mg by mouth every evening.   CENTROVITE Tabs Take 1 tablet by mouth daily.   ciprofloxacin 500 MG tablet Commonly known as: Cipro Take 1 tablet (500 mg total) by mouth 2 (two) times daily for 7 days.   clindamycin 300 MG capsule Commonly known as: CLEOCIN Take 1 capsule (300 mg total) by mouth 3 (three) times daily for 7 days.   FeroSul 325 (65 FE) MG tablet Generic drug: ferrous sulfate Take 325 mg by mouth 2 (two) times daily with a meal.   GAS-X ULTRA STRENGTH PO Take 1 capsule by mouth 4 (four) times daily as needed (for flatulence).   insulin glargine 100 UNIT/ML injection Commonly known as: LANTUS Inject 0.15 mLs (15 Units total) into the skin at bedtime.   insulin lispro 100 UNIT/ML injection Commonly known as: HUMALOG Inject 4 Units into the skin See admin instructions. Inject 4 units SQ with breakfast, inject 4 units SQ with lunch and inject 4 units SQ with dinner   Insulin Syringe-Needle U-100 31G X 5/16" 0.3 ML Misc   Droplet Insulin Syringe 31G X 5/16" 0.3 ML Misc Generic drug: Insulin Syringe-Needle U-100 USE FIVE TIMES DAILY AS DIRECTED   lisinopril 5 MG tablet Commonly known as: ZESTRIL Take 1 tablet (5 mg total) by mouth daily.   metFORMIN 1000 MG tablet Commonly known as: GLUCOPHAGE Take 1,000 mg by mouth 2  (two) times daily with a meal.   polyethylene glycol 17 g packet Commonly known as: MIRALAX / GLYCOLAX Take 17 g by mouth daily.   senna 8.6 MG Tabs tablet Commonly known as: SENOKOT Take 1 tablet by mouth 2 (two) times daily.               Discharge Care Instructions  (From admission, onward)           Start     Ordered   02/24/23 0000  Discharge wound care:       Comments: Cover wounds with Mepilex border.  Avoid laying on back or sitting your buttocks for too long   02/24/23 1728            Discharge Exam: Filed Weights   02/19/23 1324  Weight: 129.3 kg   S/p wound debridement on 02/20/2023 S/p right lower extremity angiogram 02/24/2023  Condition at discharge: good  The results of significant diagnostics  from this hospitalization (including imaging, microbiology, ancillary and laboratory) are listed below for reference.   Imaging Studies: PERIPHERAL VASCULAR CATHETERIZATION  Result Date: 02/24/2023 See surgical note for result.  MR FOOT RIGHT W WO CONTRAST  Result Date: 02/20/2023 CLINICAL DATA:  Right foot ulcer. History of transmetatarsal amputation. EXAM: MRI OF THE RIGHT FOREFOOT WITHOUT AND WITH CONTRAST TECHNIQUE: Multiplanar, multisequence MR imaging of the right foot was performed before and after the administration of intravenous contrast. CONTRAST:  10mL GADAVIST GADOBUTROL 1 MMOL/ML IV SOLN COMPARISON:  Right foot x-rays from yesterday. MRI right foot dated Jan 03, 2022. FINDINGS: Bones/Joint/Cartilage Prior transmetatarsal amputation. Minimal marrow edema along the dorsal aspect of the distal residual fifth metatarsal (series 7, image 27). No fracture or dislocation. Midfoot degenerative changes. No joint effusion. Ligaments Lisfranc ligament is intact. Medial and lateral ankle ligaments are grossly intact. Muscles and Tendons Postsurgical changes of the flexor and extensor tendons. No tenosynovitis. Complete fatty atrophy of the intrinsic muscles.  Soft tissue Diffuse soft tissue swelling. Soft tissue ulceration along lateral aspect of the amputation stump. No fluid collection. No soft tissue mass. IMPRESSION: 1. Prior transmetatarsal amputation with soft tissue ulceration along the lateral aspect of the amputation stump. No abscess. 2. Minimal marrow edema along the dorsal aspect of the distal residual fifth metatarsal is nonspecific, although given its proximity to the soft tissue ulcer, early osteomyelitis is not excluded. Electronically Signed   By: Obie Dredge M.D.   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 Large wound or ulcer lateral aspect of the foot at the level of fourth and fifth residual metatarsals. Suspected ulcer or wound lateral to the hindfoot. Vascular calcifications. Abundant soft tissue swelling. Periosteal thickening at the third fourth and fifth metatarsals. Gas surrounding the fifth metatarsal stump. Possible demineralization at the fifth metatarsal stump. 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 Large wound or ulcer lateral aspect of the foot at the level of fourth and fifth metatarsals. Abundant soft tissue swelling. Periosteal thickening at the third fourth and fifth metatarsals air some of which is chronic. Possible demineralization at the fifth metatarsal stump making it difficult to exclude osteomyelitis. Correlation with MRI is suggested. Electronically Signed   By: Jasmine Pang M.D.   On: 02/19/2023 16:06    Microbiology: Results for orders placed or performed during the hospital encounter of 02/19/23  Culture, blood (routine x 2)     Status: None   Collection Time: 02/19/23  5:29 PM   Specimen: BLOOD  Result Value Ref Range Status   Specimen Description BLOOD BLOOD RIGHT FOREARM   Final   Special Requests   Final    BOTTLES DRAWN AEROBIC AND ANAEROBIC Blood Culture results may not be optimal due to an excessive volume of blood received in culture bottles   Culture   Final    NO GROWTH 5 DAYS Performed at [facility], [address], [place], [place] [address]    Report Status 02/24/2023 FINAL  Final  Culture, blood (routine x 2)     Status: None   Collection Time: 02/19/23  5:31 PM   Specimen: BLOOD  Result Value Ref Range Status   Specimen Description BLOOD BLOOD RIGHT ARM  Final   Special Requests   Final    BOTTLES DRAWN AEROBIC AND ANAEROBIC  Blood Culture adequate volume   Culture   Final    NO GROWTH 5 DAYS Performed at [facility], [address] [place], [place] [address]    Report Status 02/24/2023 FINAL  Final  Aerobic/Anaerobic Culture w Gram Stain (surgical/deep wound)     Status: None (Preliminary result)   Collection Time: 02/21/23  8:24 AM   Specimen: Wound  Result Value Ref Range Status   Specimen Description   Final    WOUND Performed at [facility], [address] [place], [place] [address]    Special Requests   Final    NONE Performed at [facility], [address], [place], [place] [address]    Gram Stain   Final    FEW SQUAMOUS EPITHELIAL CELLS PRESENT ABUNDANT GRAM POSITIVE RODS ABUNDANT GRAM POSITIVE COCCI ABUNDANT GRAM NEGATIVE RODS Performed at [facility], [address] [place], [place] [address]    Culture   Final    RARE ENTEROBACTER CLOACAE RARE SERRATIA MARCESCENS ABUNDANT STAPHYLOCOCCUS AUREUS MODERATE BACTEROIDES FRAGILIS    Report Status PENDING  Incomplete   Organism ID, Bacteria SERRATIA MARCESCENS  Final   Organism ID, Bacteria ENTEROBACTER CLOACAE  Final      Susceptibility   Enterobacter cloacae - MIC*    CEFEPIME <=0.12 SENSITIVE Sensitive     CEFTAZIDIME <=1 SENSITIVE Sensitive     CIPROFLOXACIN <=0.25 SENSITIVE Sensitive     GENTAMICIN <=1  SENSITIVE Sensitive     IMIPENEM <=0.25 SENSITIVE Sensitive     TRIMETH/SULFA <=20 SENSITIVE Sensitive     PIP/TAZO 8 SENSITIVE Sensitive     * RARE ENTEROBACTER CLOACAE   Serratia marcescens - MIC*    CEFEPIME <=0.12 SENSITIVE Sensitive     CEFTAZIDIME <=1 SENSITIVE Sensitive     CEFTRIAXONE <=0.25 SENSITIVE Sensitive     CIPROFLOXACIN <=0.25 SENSITIVE Sensitive     GENTAMICIN <=1 SENSITIVE Sensitive     TRIMETH/SULFA <=20 SENSITIVE Sensitive     * RARE SERRATIA MARCESCENS    Labs: CBC: Recent Labs  Lab 02/19/23 1327  WBC 5.3  NEUTROABS 3.1  HGB 11.7*  HCT 36.1*  MCV 91.4  PLT 332   Basic Metabolic Panel: Recent Labs  Lab 02/19/23 1327 02/20/23 0505  NA 138 136  K 4.8 4.0  CL 106 108  CO2 24 22  GLUCOSE 120* 123*  BUN 17 14  CREATININE 1.06 0.78  CALCIUM 9.2 8.1*   Liver Function Tests: No results for input(s): "AST", "ALT", "ALKPHOS", "BILITOT", "PROT", "ALBUMIN" in the last 168 hours. CBG: Recent Labs  Lab 02/23/23 1603 02/23/23 2139 02/24/23 0807 02/24/23 1044 02/24/23 1314  GLUCAP 158* 188* 130* 128* 112*    Discharge time spent: greater than 30 minutes.  Signed: Lurene Shadow, MD [facility] 02/24/2023

## 2023-02-24 NOTE — Progress Notes (Signed)
Follow-up right lateral foot wound.  Patient is status post Angiogram.  No severe occlusions noted.  The lateral foot wound is stable.  Granulation tissue is noted.  No active severe drainage.  No purulence.  From podiatry standpoint patient is stable for discharge.  He has been performing home bandages.  He can follow-up with me in 2 weeks in the outpatient clinic.

## 2023-02-24 NOTE — Op Note (Signed)
[facility]  Percutaneous Study/Intervention Procedural Note   Date of Surgery: 02/24/2023,12:03 PM  Surgeon:Lennyx Verdell, [name]   Pre-operative Diagnosis: Atherosclerotic occlusive disease bilateral lower extremities with ulceration of the right foot  Post-operative diagnosis:  Same  Procedure(s) Performed:  1.  Abdominal aortogram  2.  Selective injection of the right lower extremity third order catheter placement  3.  Ultrasound-guided access to the left common femoral artery  4.  StarClose left femoral artery    Anesthesia: Conscious sedation was administered by the interventional radiology RN under my direct supervision. IV Versed plus fentanyl were utilized. Continuous ECG, pulse oximetry and blood pressure was monitored throughout the entire procedure.  Conscious sedation was administered for a total of 56 minutes.  Sheath: 5 French 11 cm Pinnacle sheath retrograde left common femoral  Contrast: 50 cc   Fluoroscopy Time: 14.1 minutes  Indications:  The patient presents to [facility] with atherosclerotic occlusive disease bilateral lower extremities with ulcerations that are nonhealing.  Pedal pulses are nonpalpable bilaterally suggesting hemodynamically significant atherosclerotic occlusive disease.  The risks and benefits as well as alternative therapies for lower extremity revascularization are reviewed with the patient all questions are answered the patient agrees to proceed.  The patient is therefore undergoing angiography with the hope for intervention for limb salvage.   Procedure:  Jerry Lunt Farrishis a 72 y.o. male who was identified and appropriate procedural time out was performed.  The patient was then placed supine on the table and prepped and draped in the usual sterile fashion.  Ultrasound was used to evaluate the left common femoral artery.  It was echolucent and pulsatile indicating it is patent .  An ultrasound image was acquired for  the permanent record.  A micropuncture needle was used to access the left common femoral artery under direct ultrasound guidance.  The microwire was then advanced under fluoroscopic guidance without difficulty followed by the micro-sheath.  A 0.035 J wire was advanced without resistance and a 5Fr sheath was placed.    Pigtail catheter was then advanced to the level of T12 and AP projection of the aorta was obtained. Pigtail catheter was then repositioned to above the bifurcation and LAO view of the pelvis was obtained.  Numerous catheters and wires were utilized to attempt to cross the bifurcation with the sheath.  These included an advantage wire and both 018 as well as 035.  Stiff angled glide wires and V18 wires.  This also included both the pigtail catheter a rim catheter as well as a V S1 catheter.  Ultimately I was able to get the straight glide catheter down to the distal right external iliac artery where RAO projection of the femoral bifurcation was obtained.  I then introduced a prograde catheter through the straight glide catheter negotiated the prograde catheter into the SFA where distal runoff was obtained.  Distal imaging was inadequate and ultimately using a combination of the 018 advantage wire with the prograde and the straight glide catheter was able to negotiate the straight glide catheter into the proximal SFA.  Hand-injection of contrast through the straight glide catheter did yield adequate imaging distally.    After review of the images the catheter was removed over wire and an LAO view of the groin was obtained. StarClose device was deployed without difficulty.   Findings:   Aortogram: The abdominal aorta is opacified with a bolus injection contrast.  Single renal arteries are noted bilaterally with normal nephrograms.  No evidence of  hemodynamically significant renal artery stenosis.  There are no hemodynamically significant stenoses identified within the aorta.  The aortic  bifurcation is mildly diseased but widely patent it has a very straight up and down steep alike anatomy which made it very difficult to cross ultimately I was able to get a catheter crossed but never a 035 stiff wire and therefore never able to get a sheath up and over..  Bilateral common internal and external iliac arteries are free of hemodynamically significant lesions.  Right lower Extremity: The right common femoral, profunda femoris superficial femoral and popliteal arteries demonstrate mild atherosclerotic changes but there are no hemodynamically significant lesions.  Trifurcation is patent.  There is 3 vessel runoff to the foot although the posterior tibial is smaller and does have more atherosclerotic changes than the anterior tibial it is patent without a hemodynamically significant stenosis.  The anterior tibial is quite large 3-1/2 mm or more all the way to the foot filling the dorsalis pedis and the pedal arch.  SUMMARY: Based on these images no intervention is performed at this time.    Disposition: Patient was taken to the recovery room in stable condition having tolerated the procedure well.  [name] 02/24/2023,12:03 PM

## 2023-02-24 NOTE — Care Management Important Message (Signed)
Important Message  Patient Details  Name: [name] MRN: [id number] Date of Birth: Aug 28, 1951   Medicare Important Message Given:  N/A - LOS <3 / Initial given by admissions     [name] 02/24/2023, 9:55 AM

## 2023-02-24 NOTE — Progress Notes (Addendum)
Progress Note    [name]  ZOX:[id number] DOB: 01-09-1951  DOA: 02/19/2023 PCP: Abram Sander, MD      Brief Narrative:    Medical records reviewed and are as summarized below:  [name] is a 72 y.o. 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  He said he has had this ulcer since he has right transmetatarsal amputation in May 2023.  About 2 weeks ago, he noticed a foul smell from the right foot ulcer.  He also noticed that there was drainage from the ulcer.  He could not tell the color of the drainage because he said he cannot see the plantar aspect/bottom of his feet.  He noticed increased burning sensation on the right foot as well.  He was concerned about losing his foot so he presented to the emergency department for further management.  He reported diarrhea about 2 weeks ago and this was followed by constipation few days later.    He was admitted to the hospital for right diabetic foot ulcer with infection and suspected osteomyelitis of the right foot.    Assessment/Plan:   Principal Problem:   Diabetic infection of right foot (HCC) Active Problems:   Hyperlipidemia   Chronic venous insufficiency   Hypertension   Obesity   Body mass index is 39.75 kg/m.  (Obesity)   Right diabetic foot ulcer with infection: S/p right foot wound debridement on 02/21/2023.  Wound culture showed Enterobacter cloacae, Serratia marcescens, Bacteroides fragilis and Staphylococcus aureus.  Sensitivity report for Staphylococcus aureus is still pending.  Reengaged podiatrist (secure chat sent to Dr. Ether Griffins and Dr.  Excell Seltzer).  Dr. Excell Seltzer recommended Augmentin and ciprofloxacin for 7 days.  Dr. Follow-up will see the patient later today. CRP was normal on ESR was only slightly elevated at 40. Of note, patient is allergic to Bactrim which is effective against both Enterobacter cloacae and Serratia marcescens.   Insulin-dependent diabetes mellitus: Glucose levels have been optimal.  Continue insulin glargine 15 units nightly, NovoLog 10 units 3 times daily with meals and NovoLog as needed for hyperglycemia. Patient said he takes metformin, Lantus 30 units nightly and Humalog 15 units 3 times daily with meals.  Hemoglobin A1c 6.4.    PVD: S/p right lower extremity angiogram on 02/24/2023.  There was no evidence of hemodynamically significant stenosis.     Lactic acidosis: Resolved.    Stage II decubitus ulcers on bilateral buttocks, stage II decubitus ulcers on bilateral hips: All present on admission.  Continue local wound care.      Other comorbidities include hypertension, hyperlipidemia, obesity, chronic venous insufficiency    Diet Order             Diet Carb Modified Fluid consistency: Thin; Room service appropriate? Yes  Diet effective now                            Consultants: Podiatrist Vascular surgeon  Procedures: S/p wound debridement on 02/20/2023 S/p right lower extremity angiogram 02/24/2023    Medications:    cefadroxil  500 mg Oral BID   doxycycline  100 mg Oral Q12H   heparin  5,000 Units  Subcutaneous Q8H   insulin aspart  0-9 Units Subcutaneous TID WC   insulin aspart  10 Units Subcutaneous TID WC   insulin glargine-yfgn  15 Units Subcutaneous QHS   sodium chloride flush  3 mL Intravenous Q12H   Continuous Infusions:  sodium chloride     sodium chloride        Anti-infectives (From admission, onward)    Start     Dose/Rate Route Frequency Ordered Stop   02/24/23 1007  vancomycin (VANCOREADY) IVPB 1500 mg/300 mL  Status:  Discontinued         1,500 mg 150 mL/hr over 120 Minutes Intravenous 120 min pre-op 02/24/23 1007 02/24/23 1219   02/22/23 2200  doxycycline (VIBRA-TABS) tablet 100 mg        100 mg Oral Every 12 hours 02/22/23 1153     02/22/23 1245  cefadroxil (DURICEF) capsule 500 mg        500 mg Oral 2 times daily 02/22/23 1153     02/20/23 2200  vancomycin (VANCOREADY) IVPB 2000 mg/400 mL  Status:  Discontinued        2,000 mg 200 mL/hr over 120 Minutes Intravenous Every 24 hours 02/19/23 1754 02/20/23 0940   02/20/23 1100  vancomycin (VANCOREADY) IVPB 1250 mg/250 mL  Status:  Discontinued        1,250 mg 166.7 mL/hr over 90 Minutes Intravenous Every 12 hours 02/20/23 0940 02/22/23 1154   02/19/23 2000  ceFEPIme (MAXIPIME) 2 g in sodium chloride 0.9 % 100 mL IVPB  Status:  Discontinued        2 g 200 mL/hr over 30 Minutes Intravenous Every 8 hours 02/19/23 1749 02/22/23 1149   02/19/23 1900  vancomycin (VANCOREADY) IVPB 2000 mg/400 mL        2,000 mg 200 mL/hr over 120 Minutes Intravenous  Once 02/19/23 1749 02/20/23 0107   02/19/23 1715  ceFEPIme (MAXIPIME) 1 g in sodium chloride 0.9 % 100 mL IVPB  Status:  Discontinued        1 g 200 mL/hr over 30 Minutes Intravenous  Once 02/19/23 1711 02/19/23 1747   02/19/23 1715  vancomycin (VANCOCIN) IVPB 1000 mg/200 mL premix  Status:  Discontinued        1,000 mg 200 mL/hr over 60 Minutes Intravenous  Once 02/19/23 1711 02/19/23 1747              Family Communication/Anticipated D/C date and plan/Code Status   DVT prophylaxis: heparin injection 5,000 Units Start: 02/19/23 2200     Code Status: Full Code  Family Communication: None Disposition Plan: Plan to discharge home tomorrow   Status is: Inpatient Remains inpatient appropriate because: Awaiting final wound culture sensitivity report of Staphylococcus aureus      Subjective:   No acute events overnight.  Pain in right foot is better.  Dressing on right foot was changed this morning.  Objective:     Vitals:   02/24/23 1215 02/24/23 1216 02/24/23 1230 02/24/23 1536  BP: 118/71 117/89 126/65 110/64  Pulse: 73 93 72 81  Resp: 15 13 14 16   Temp:    97.8 F (36.6 C)  TempSrc:      SpO2: 94% 96% 95% 100%  Weight:      Height:       No data found.   Intake/Output Summary (Last 24 hours) at 02/24/2023 1606 Last data filed at 02/24/2023 0755 Gross per 24 hour  Intake --  Output 1850 ml  Net -1850 ml  Filed Weights   02/19/23 1324  Weight: 129.3 kg    Exam:  GEN: NAD SKIN: Warm and dry EYES: EOMI ENT: MMM CV: RRR PULM: CTA B ABD: soft, obese, NT, +BS CNS: AAO x 3, non focal EXT: Edema of right leg has improved.  Dressing on right foot wound is clean, dry and intact.    Data Reviewed:   I have personally reviewed following labs and imaging studies:  Labs: Labs show the following:   Basic Metabolic Panel: Recent Labs  Lab 02/19/23 1327 02/20/23 0505  NA 138 136  K 4.8 4.0  CL 106 108  CO2 24 22  GLUCOSE 120* 123*  BUN 17 14  CREATININE 1.06 0.78  CALCIUM 9.2 8.1*   GFR Estimated Creatinine Clearance: 114.4 mL/min (by C-G formula based on SCr of 0.78 mg/dL). Liver Function Tests: No results for input(s): "AST", "ALT", "ALKPHOS", "BILITOT", "PROT", "ALBUMIN" in the last 168 hours. No results for input(s): "LIPASE", "AMYLASE" in the last 168 hours. No results for input(s): "AMMONIA" in the last 168 hours. Coagulation profile No results for input(s): "INR", "PROTIME" in the last 168 hours.  CBC: Recent Labs  Lab 02/19/23 1327  WBC 5.3  NEUTROABS 3.1  HGB 11.7*  HCT 36.1*  MCV 91.4  PLT 332   Cardiac Enzymes: No results for input(s): "CKTOTAL", "CKMB", "CKMBINDEX", "TROPONINI" in the last 168 hours. BNP (last 3 results) No results for input(s): "PROBNP" in the last 8760 hours. CBG: Recent Labs  Lab 02/23/23 1212 02/23/23 1603 02/23/23 2139 02/24/23 0807 02/24/23 1044  GLUCAP 147* 158* 188* 130* 128*   D-Dimer: No results for  input(s): "DDIMER" in the last 72 hours. Hgb A1c: No results for input(s): "HGBA1C" in the last 72 hours.  Lipid Profile: No results for input(s): "CHOL", "HDL", "LDLCALC", "TRIG", "CHOLHDL", "LDLDIRECT" in the last 72 hours. Thyroid function studies: No results for input(s): "TSH", "T4TOTAL", "T3FREE", "THYROIDAB" in the last 72 hours.  Invalid input(s): "FREET3" Anemia work up: No results for input(s): "VITAMINB12", "FOLATE", "FERRITIN", "TIBC", "IRON", "RETICCTPCT" in the last 72 hours. Sepsis Labs: Recent Labs  Lab 02/19/23 1319 02/19/23 1327 02/19/23 1328 02/20/23 0505  WBC  --  5.3  --   --   LATICACIDVEN 1.4  --  2.1* 0.8    Microbiology Recent Results (from the past 240 hour(s))  Culture, blood (routine x 2)     Status: None   Collection Time: 02/19/23  5:29 PM   Specimen: BLOOD  Result Value Ref Range Status   Specimen Description BLOOD BLOOD RIGHT FOREARM  Final   Special Requests   Final    BOTTLES DRAWN AEROBIC AND ANAEROBIC Blood Culture results may not be optimal due to an excessive volume of blood received in culture bottles   Culture   Final    NO GROWTH 5 DAYS Performed at [facility], [address], [place], [place] [address]    Report Status 02/24/2023 FINAL  Final  Culture, blood (routine x 2)     Status: None   Collection Time: 02/19/23  5:31 PM   Specimen: BLOOD  Result Value Ref Range Status   Specimen Description BLOOD BLOOD RIGHT ARM  Final   Special Requests   Final    BOTTLES DRAWN AEROBIC AND ANAEROBIC Blood Culture adequate volume   Culture   Final    NO GROWTH 5 DAYS Performed at [facility], [address] [place], [place] [address]    Report Status 02/24/2023 FINAL  Final  Aerobic/Anaerobic Culture w Gram Stain (surgical/deep wound)     Status: None (Preliminary result)   Collection Time: 02/21/23  8:24 AM   Specimen: Wound  Result Value Ref Range Status   Specimen Description   Final    WOUND Performed at  [facility], [address] [place], [place] [address]    Special Requests   Final    NONE Performed at [facility], [address] [place], [place] [address]    Gram Stain   Final    FEW SQUAMOUS EPITHELIAL CELLS PRESENT ABUNDANT GRAM POSITIVE RODS ABUNDANT GRAM POSITIVE COCCI ABUNDANT GRAM NEGATIVE RODS Performed at [facility], [address], [place], [place] [address]    Culture   Final    RARE ENTEROBACTER CLOACAE RARE SERRATIA MARCESCENS ABUNDANT STAPHYLOCOCCUS AUREUS MODERATE BACTEROIDES FRAGILIS    Report Status PENDING  Incomplete   Organism ID, Bacteria SERRATIA MARCESCENS  Final   Organism ID, Bacteria ENTEROBACTER CLOACAE  Final      Susceptibility   Enterobacter cloacae - MIC*    CEFEPIME <=0.12 SENSITIVE Sensitive     CEFTAZIDIME <=1 SENSITIVE Sensitive     CIPROFLOXACIN <=0.25 SENSITIVE Sensitive     GENTAMICIN <=1 SENSITIVE Sensitive     IMIPENEM <=0.25 SENSITIVE Sensitive     TRIMETH/SULFA <=20 SENSITIVE Sensitive     PIP/TAZO 8 SENSITIVE Sensitive     * RARE ENTEROBACTER CLOACAE   Serratia marcescens - MIC*    CEFEPIME <=0.12 SENSITIVE Sensitive     CEFTAZIDIME <=1 SENSITIVE Sensitive     CEFTRIAXONE <=0.25 SENSITIVE Sensitive     CIPROFLOXACIN <=0.25 SENSITIVE Sensitive     GENTAMICIN <=1 SENSITIVE Sensitive     TRIMETH/SULFA <=20 SENSITIVE Sensitive     * RARE SERRATIA MARCESCENS    Procedures and diagnostic studies:  PERIPHERAL VASCULAR CATHETERIZATION  Result Date: 02/24/2023 See surgical note for result.              LOS: 5 days   Jazmyne Beauchesne  [facility]   Pager on [email or web address]. If 7PM-7AM, please contact night-coverage at [email or web address]     02/24/2023, 4:06 PM

## 2023-02-25 LAB — AEROBIC/ANAEROBIC CULTURE W GRAM STAIN (SURGICAL/DEEP WOUND)

## 2023-07-03 ENCOUNTER — Encounter: Payer: Self-pay | Admitting: Ophthalmology

## 2023-07-07 NOTE — Anesthesia Preprocedure Evaluation (Addendum)
Anesthesia Evaluation  Patient identified by MRN, date of birth, ID band Patient awake    Reviewed: Allergy & Precautions, H&P , NPO status , Patient's Chart, lab work & pertinent test results  Airway Mallampati: II  TM Distance: >3 FB Neck ROM: Full    Dental no notable dental hx. (+) Missing, Poor Dentition   Pulmonary shortness of breath, former smoker   Pulmonary exam normal breath sounds clear to auscultation       Cardiovascular hypertension, + Peripheral Vascular Disease  Normal cardiovascular exam Rhythm:Regular Rate:Normal  02--06-18  Echo WNL   Neuro/Psych negative neurological ROS  negative psych ROS   GI/Hepatic negative GI ROS, Neg liver ROS,,,  Endo/Other  diabetes    Renal/GU Renal disease  negative genitourinary   Musculoskeletal  (+) Arthritis ,    Abdominal   Peds negative pediatric ROS (+)  Hematology  (+) Blood dyscrasia, anemia   Anesthesia Other Findings   Medical History Obesity  Diabetes mellitus without complication Hypertension Peripheral vascular disease  Chronic kidney disease Hyperlipidemia  Sepsis  Bladder incontinence  Arthritis Dyspnea  DVT of leg (deep venous thrombosis)     Reproductive/Obstetrics negative OB ROS                             Anesthesia Physical Anesthesia Plan  ASA: 3  Anesthesia Plan: MAC   Post-op Pain Management:    Induction: Intravenous  PONV Risk Score and Plan:   Airway Management Planned: Natural Airway and Nasal Cannula  Additional Equipment:   Intra-op Plan:   Post-operative Plan:   Informed Consent: I have reviewed the patients History and Physical, chart, labs and discussed the procedure including the risks, benefits and alternatives for the proposed anesthesia with the patient or authorized representative who has indicated his/her understanding and acceptance.     Dental Advisory Given  Plan  Discussed with: Anesthesiologist, CRNA and Surgeon  Anesthesia Plan Comments: (Patient consented for risks of anesthesia including but not limited to:  - adverse reactions to medications - damage to eyes, teeth, lips or other oral mucosa - nerve damage due to positioning  - sore throat or hoarseness - Damage to heart, brain, nerves, lungs, other parts of body or loss of life  Patient voiced understanding and assent.)        Anesthesia Quick Evaluation

## 2023-07-09 NOTE — Discharge Instructions (Signed)

## 2023-07-13 ENCOUNTER — Other Ambulatory Visit: Payer: Self-pay

## 2023-07-13 ENCOUNTER — Encounter
Admission: RE | Disposition: A | Discharge status: Home / Self Care | Payer: Self-pay | Source: Home / Self Care | Attending: Ophthalmology

## 2023-07-13 ENCOUNTER — Ambulatory Visit
Admission: RE | Admit: 2023-07-13 | Discharge: 2023-07-13 | Disposition: A | Discharge status: Home / Self Care | Payer: Medicare HMO | Attending: Ophthalmology | Admitting: Ophthalmology

## 2023-07-13 ENCOUNTER — Ambulatory Visit: Payer: Medicare HMO | Admitting: Anesthesiology

## 2023-07-13 ENCOUNTER — Encounter: Payer: Self-pay | Admitting: Ophthalmology

## 2023-07-13 DIAGNOSIS — I129 Hypertensive chronic kidney disease with stage 1 through stage 4 chronic kidney disease, or unspecified chronic kidney disease: Secondary | ICD-10-CM | POA: Diagnosis not present

## 2023-07-13 DIAGNOSIS — Z7984 Long term (current) use of oral hypoglycemic drugs: Secondary | ICD-10-CM | POA: Diagnosis not present

## 2023-07-13 DIAGNOSIS — N189 Chronic kidney disease, unspecified: Secondary | ICD-10-CM | POA: Diagnosis not present

## 2023-07-13 DIAGNOSIS — Z87891 Personal history of nicotine dependence: Secondary | ICD-10-CM | POA: Diagnosis not present

## 2023-07-13 DIAGNOSIS — E1151 Type 2 diabetes mellitus with diabetic peripheral angiopathy without gangrene: Secondary | ICD-10-CM | POA: Insufficient documentation

## 2023-07-13 DIAGNOSIS — Z794 Long term (current) use of insulin: Secondary | ICD-10-CM | POA: Diagnosis not present

## 2023-07-13 DIAGNOSIS — E1122 Type 2 diabetes mellitus with diabetic chronic kidney disease: Secondary | ICD-10-CM | POA: Insufficient documentation

## 2023-07-13 DIAGNOSIS — E1136 Type 2 diabetes mellitus with diabetic cataract: Secondary | ICD-10-CM | POA: Insufficient documentation

## 2023-07-13 DIAGNOSIS — H2511 Age-related nuclear cataract, right eye: Secondary | ICD-10-CM | POA: Diagnosis not present

## 2023-07-13 HISTORY — PX: CATARACT EXTRACTION W/PHACO: SHX586

## 2023-07-13 LAB — GLUCOSE, CAPILLARY: Glucose-Capillary: 129 mg/dL — ABNORMAL HIGH (ref 70–99)

## 2023-07-13 SURGERY — PHACOEMULSIFICATION, CATARACT, WITH IOL INSERTION
Anesthesia: Monitor Anesthesia Care | Laterality: Right

## 2023-07-13 MED ORDER — ARMC OPHTHALMIC DILATING DROPS
1.0000 | OPHTHALMIC | Status: DC | PRN
  Administered 2023-07-13 (×3): 1 via OPHTHALMIC

## 2023-07-13 MED ORDER — TETRACAINE HCL 0.5 % OP SOLN
1.0000 [drp] | OPHTHALMIC | Status: DC | PRN
  Administered 2023-07-13 (×3): 1 [drp] via OPHTHALMIC

## 2023-07-13 MED ORDER — MIDAZOLAM HCL 2 MG/2ML IJ SOLN
INTRAMUSCULAR | Status: DC | PRN
  Administered 2023-07-13 (×2): 1 mg via INTRAVENOUS

## 2023-07-13 MED ORDER — SODIUM CHLORIDE 0.9% FLUSH
INTRAVENOUS | Status: DC | PRN
  Administered 2023-07-13 (×2): 10 mL via INTRAVENOUS

## 2023-07-13 MED ORDER — LIDOCAINE HCL (PF) 2 % IJ SOLN
INTRAOCULAR | Status: DC | PRN
  Administered 2023-07-13: 1 mL via INTRAOCULAR

## 2023-07-13 MED ORDER — SIGHTPATH DOSE#1 BSS IO SOLN
INTRAOCULAR | Status: DC | PRN
  Administered 2023-07-13: 15 mL

## 2023-07-13 MED ORDER — MIDAZOLAM HCL 2 MG/2ML IJ SOLN
INTRAMUSCULAR | Status: AC
Start: 2023-07-13 — End: ?
  Filled 2023-07-13: qty 2

## 2023-07-13 MED ORDER — SIGHTPATH DOSE#1 NA HYALUR & NA CHOND-NA HYALUR IO KIT
PACK | INTRAOCULAR | Status: DC | PRN
  Administered 2023-07-13: 1 via OPHTHALMIC

## 2023-07-13 MED ORDER — FENTANYL CITRATE (PF) 100 MCG/2ML IJ SOLN
INTRAMUSCULAR | Status: AC
  Filled 2023-07-13: qty 2

## 2023-07-13 MED ORDER — TETRACAINE HCL 0.5 % OP SOLN
OPHTHALMIC | Status: AC
  Filled 2023-07-13: qty 4

## 2023-07-13 MED ORDER — FENTANYL CITRATE (PF) 100 MCG/2ML IJ SOLN
INTRAMUSCULAR | Status: DC | PRN
  Administered 2023-07-13: 25 ug via INTRAVENOUS

## 2023-07-13 MED ORDER — MOXIFLOXACIN HCL 0.5 % OP SOLN
OPHTHALMIC | Status: DC | PRN
  Administered 2023-07-13: .2 mL via OPHTHALMIC

## 2023-07-13 MED ORDER — SIGHTPATH DOSE#1 BSS IO SOLN
INTRAOCULAR | Status: DC | PRN
  Administered 2023-07-13: 103 mL via OPHTHALMIC

## 2023-07-13 MED ORDER — ARMC OPHTHALMIC DILATING DROPS
OPHTHALMIC | Status: AC
  Filled 2023-07-13: qty 0.5

## 2023-07-13 SURGICAL SUPPLY — 11 items
CATARACT SUITE SIGHTPATH (MISCELLANEOUS) ×1
DISSECTOR HYDRO NUCLEUS 50X22 (MISCELLANEOUS) ×1 IMPLANT
FEE CATARACT SUITE SIGHTPATH (MISCELLANEOUS) ×1 IMPLANT
GLOVE SURG GAMMEX PI TX LF 7.5 (GLOVE) ×1 IMPLANT
GLOVE SURG SYN 8.5 E (GLOVE) ×1
GLOVE SURG SYN 8.5 PF PI (GLOVE) ×1 IMPLANT
LENS IOL TECNIS EYHANCE 21.0 (Intraocular Lens) IMPLANT
NDL FILTER BLUNT 18X1 1/2 (NEEDLE) ×1 IMPLANT
NEEDLE FILTER BLUNT 18X1 1/2 (NEEDLE) ×1
SYR 3ML LL SCALE MARK (SYRINGE) ×1 IMPLANT
SYR 5ML LL (SYRINGE) ×1 IMPLANT

## 2023-07-13 NOTE — Op Note (Signed)
OPERATIVE NOTE  [name] [id number] 07/13/2023   PREOPERATIVE DIAGNOSIS:  Nuclear sclerotic cataract right eye.  H25.11   POSTOPERATIVE DIAGNOSIS:    Nuclear sclerotic cataract right eye.     PROCEDURE:  Phacoemusification with posterior chamber intraocular lens placement of the right eye   LENS:   Implant Name Type Inv. Item Serial No. Manufacturer Lot No. LRB No. Used Action  LENS IOL TECNIS EYHANCE 21.0 - [id number] Intraocular Lens LENS IOL TECNIS EYHANCE 21.0 [id number] [employer]  Right 1 Implanted       Procedure(s): CATARACT EXTRACTION PHACO AND INTRAOCULAR LENS PLACEMENT (IOC) RIGHT DIABETIC 4.00 00:36.6 (Right)  SURGEON:  Willey Blade, MD, MPH  ANESTHESIOLOGIST: Anesthesiologist: Marisue Humble, MD CRNA: Genia Del, CRNA   ANESTHESIA:  Topical with tetracaine drops augmented with 1% preservative-free intracameral lidocaine.  ESTIMATED BLOOD LOSS: less than 1 mL.   COMPLICATIONS:  None.   DESCRIPTION OF PROCEDURE:  The patient was identified in the holding room and transported to the operating room and placed in the supine position under the operating microscope.  The right eye was identified as the operative eye and it was prepped and draped in the usual sterile ophthalmic fashion.   A 1.0 millimeter clear-corneal paracentesis was made at the 10:30 position. 0.5 ml of preservative-free 1% lidocaine with epinephrine was injected into the anterior chamber.  The anterior chamber was filled with viscoelastic.  A 2.4 millimeter keratome was used to make a near-clear corneal incision at the 8:00 position.  A curvilinear capsulorrhexis was made with a cystotome and capsulorrhexis forceps.  Balanced salt solution was used to hydrodissect and hydrodelineate the nucleus.   Phacoemulsification was then used in stop and chop fashion to remove the lens nucleus and epinucleus.  The remaining cortex was then removed using the irrigation and aspiration handpiece.  Viscoelastic was then placed into the capsular bag to distend it for lens placement.  A lens was then injected into the capsular bag.  The remaining viscoelastic was aspirated.   Wounds were hydrated with balanced salt solution.  The anterior chamber was inflated to a physiologic pressure with balanced salt solution.   Intracameral vigamox 0.1 mL undiluted was injected into the eye and a drop placed onto the ocular surface.  No wound leaks were noted.  The patient was taken to the recovery room in stable condition without complications of anesthesia or surgery  Willey Blade 07/13/2023, 8:22 AM

## 2023-07-13 NOTE — Transfer of Care (Signed)
Immediate Anesthesia Transfer of Care Note  Patient: [name]  Procedure(s) Performed: CATARACT EXTRACTION PHACO AND INTRAOCULAR LENS PLACEMENT (IOC) RIGHT DIABETIC 4.00 00:36.6 (Right)  Patient Location: PACU  Anesthesia Type:MAC  Level of Consciousness: awake and alert   Airway & Oxygen Therapy: Patient Spontanous Breathing  Post-op Assessment: Report given to RN and Post -op Vital signs reviewed and stable  Post vital signs: Reviewed and stable  Last Vitals: See PACU flow sheet for temp. Vitals Value Taken Time  BP 130/71 07/13/23 0824  Temp    Pulse 81 07/13/23 0825  Resp 22 07/13/23 0825  SpO2 98 % 07/13/23 0825  Vitals shown include unfiled device data.  Last Pain:  Vitals:   07/13/23 0638  TempSrc: Temporal  PainSc: 0-No pain      Patients Stated Pain Goal: 0 (07/13/23 1610)  Complications: No notable events documented.

## 2023-07-13 NOTE — H&P (Signed)
[facility]   Primary Care Physician:  Abram Sander, MD Ophthalmologist: Dr. Willey Blade  Pre-Procedure History & Physical: HPI:  [name] is a 72 y.o. male here for cataract surgery.   Past Medical History:  Diagnosis Date   Arthritis    Bladder incontinence    Chronic kidney disease    Stage 3 Kidney disease   Diabetes mellitus without complication (HCC)    DVT of leg (deep venous thrombosis) (HCC)    H/O RIGHT LEG 2018   Dyspnea    occassional   Hyperlipidemia    Hypertension    Peripheral vascular disease (HCC)    Sepsis (HCC)     Past Surgical History:  Procedure Laterality Date   ABDOMINAL AORTOGRAM W/LOWER EXTREMITY N/A 10/10/2016   Procedure: Abdominal Aortogram w/Lower Extremity;  Surgeon: Renford Dills, MD;  Location: [facility] INVASIVE CV [facility];  Service: Cardiovascular;  Laterality: N/A;   CATARACT EXTRACTION W/PHACO Left 05/27/2018   Procedure: CATARACT EXTRACTION PHACO AND INTRAOCULAR LENS PLACEMENT (IOC);  Surgeon: Nevada Crane, MD;  Location: [facility] ORS;  Service: Ophthalmology;  Laterality: Left;  [place] 03:06.2 AP% 16.1 CDE 30.16 FLUID PACK LOT @ [id number] H   CIRCUMCISION  1998   EYE SURGERY Left    Retina Detachment   GRAFT APPLICATION Right 05/29/2017   Procedure: GRAFT APPLICATION ( RIGHT FOOT );  Surgeon: Renford Dills, MD;  Location: [facility] ORS;  Service: Vascular;  Laterality: Right;  graft taken from patients right thigh   IRRIGATION AND DEBRIDEMENT FOOT Right 10/03/2016   Procedure: IRRIGATION AND DEBRIDEMENT FOOT;  Surgeon: Gwyneth Revels, DPM;  Location: [facility] OR[facility];  Service: Podiatry;  Laterality: Right;   LOWER EXTREMITY ANGIOGRAPHY Right 02/24/2023   Procedure: Lower Extremity Angiography;  Surgeon: Renford Dills, MD;  Location: [facility];  Service: Cardiovascular;  Laterality: Right;   LOWER EXTREMITY INTERVENTION  10/10/2016   Procedure: Lower Extremity Intervention;  Surgeon: Renford Dills, MD;  Location: [facility]  INVASIVE CV LAB;  Service: Cardiovascular;;   PERIPHERAL VASCULAR BALLOON ANGIOPLASTY Left 10/10/2016   Procedure: Peripheral Vascular Balloon Angioplasty;  Surgeon: Renford Dills, MD;  Location: [facility];  Service: Cardiovascular;  Laterality: Left;   TONSILLECTOMY     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/2022   Procedure: TRANSMETATARSAL AMPUTATION;  Surgeon: Gwyneth Revels, DPM;  Location: [facility] OR[facility];  Service: Podiatry;  Laterality: Right;   WOUND DEBRIDEMENT Right 11/07/2016   Procedure: DEBRIDEMENT OF WOUND AND BONE RIGHT FOOT AND APPLY WOUND VAC;  Surgeon: Gwyneth Revels, DPM;  Location: [facility] OR[facility];  Service: Podiatry;  Laterality: Right;    Prior to Admission medications   Medication Sig Start Date End Date Taking? Authorizing Provider  ACCU-CHEK GUIDE test strip Use 1 strip via meter four times a day  to monitor blood glucose 07/10/20  Yes [provider]  Accu-Chek Softclix Lancets lancets  04/29/20  Yes [provider]  acetaminophen (TYLENOL) 325 MG tablet Take 650 mg by mouth every 6 (six) hours as needed.   Yes [provider]  atorvastatin (LIPITOR) 10 MG tablet Take 10 mg by mouth every evening. 06/12/20  Yes [provider]  FEROSUL 325 (65 Fe) MG tablet Take 325 mg by mouth 2 (two) times daily with a meal. 12/26/21  Yes [provider]  glucose blood (ACCU-CHEK AVIVA PLUS) test strip  04/29/20  Yes [provider]  insulin glargine (LANTUS) 100 UNIT/ML injection Inject 0.15 mLs (15 Units total) into the skin  at bedtime. 10/13/16  Yes Enid Baas, MD  insulin lispro (HUMALOG) 100 UNIT/ML injection Inject 4 Units into the skin See admin instructions. Inject 4 units SQ with breakfast, inject 4 units SQ with lunch and inject 4 units SQ with dinner   Yes [provider]  Insulin Syringe-Needle U-100 31G X 5/16" 0.3 ML MISC  06/05/20  Yes [provider]  lisinopril (ZESTRIL) 5 MG tablet Take 1 tablet (5  mg total) by mouth daily. 01/08/22  Yes Enedina Finner, MD  metFORMIN (GLUCOPHAGE) 1000 MG tablet Take 1,000 mg by mouth 2 (two) times daily with a meal. 06/12/20  Yes [provider]  polyethylene glycol (MIRALAX / GLYCOLAX) packet Take 17 g by mouth daily.   Yes [provider]  Simethicone (GAS-X ULTRA STRENGTH PO) Take 1 capsule by mouth 4 (four) times daily as needed (for flatulence).   Yes [provider]  DROPLET INSULIN SYRINGE 31G X 5/16" 0.3 ML MISC USE FIVE TIMES DAILY AS DIRECTED 06/12/20   [provider]  Multiple Vitamins-Minerals (CENTROVITE) TABS Take 1 tablet by mouth daily. Patient not taking: Reported on 07/03/2023    [provider]  senna (SENOKOT) 8.6 MG TABS tablet Take 1 tablet by mouth 2 (two) times daily. Patient not taking: Reported on 07/03/2023    [provider]    Allergies as of 06/05/2023 - Review Complete 02/24/2023  Allergen Reaction Noted   Sulfa antibiotics Rash 10/10/2016   Zosyn [piperacillin sod-tazobactam so] Rash 10/10/2016    Family History  Problem Relation Age of Onset   Diabetes Mother    Hypertension Mother    Diabetes Father    Hypertension Father     Social History   Socioeconomic History   Marital status: Single    Spouse name: Not on file   Number of children: Not on file   Years of education: Not on file   Highest education level: Not on file  Occupational History   Not on file  Tobacco Use   Smoking status: Former    Types: Pipe    Quit date: 05/23/1975    Years since quitting: 48.1   Smokeless tobacco: Never  Vaping Use   Vaping status: Never Used  Substance and Sexual Activity   Alcohol use: No   Drug use: No   Sexual activity: Not on file  Other Topics Concern   Not on file  Social History Narrative   Not on file   Social Determinants of Health   Financial Resource Strain: Not on file  Food Insecurity: No Food Insecurity (02/19/2023)   Hunger Vital Sign     Worried About Running Out of Food in the Last Year: Never true    Ran Out of Food in the Last Year: Never true  Transportation Needs: No Transportation Needs (02/19/2023)   PRAPARE - Administrator, Civil Service (Medical): No    Lack of Transportation (Non-Medical): No  Physical Activity: Not on file  Stress: Not on file  Social Connections: Not on file  Intimate Partner Violence: Not At Risk (02/19/2023)   Humiliation, Afraid, Rape, and Kick questionnaire    Fear of Current or Ex-Partner: No    Emotionally Abused: No    Physically Abused: No    Sexually Abused: No    Review of Systems: See HPI, otherwise negative ROS  Physical Exam: BP 133/70   Pulse 86   Temp (!) 97.3 F (36.3 C) (Temporal)   Resp 17  Ht 5\' 11"  (1.803 m)   Wt 129.3 kg   SpO2 97%   BMI 39.75 kg/m  General:   Alert, cooperative in NAD Head:  Normocephalic and atraumatic. Respiratory:  Normal work of breathing. Cardiovascular:  RRR  Impression/Plan: [name] is here for cataract surgery.  Risks, benefits, limitations, and alternatives regarding cataract surgery have been reviewed with the patient.  Questions have been answered.  All parties agreeable.   Willey Blade, MD  07/13/2023, 7:55 AM

## 2023-07-13 NOTE — Anesthesia Postprocedure Evaluation (Signed)
Anesthesia Post Note  Patient: [name]  Procedure(s) Performed: CATARACT EXTRACTION PHACO AND INTRAOCULAR LENS PLACEMENT (IOC) RIGHT DIABETIC 4.00 00:36.6 (Right)  Patient location during evaluation: PACU Anesthesia Type: MAC Level of consciousness: awake and alert Pain management: pain level controlled Vital Signs Assessment: post-procedure vital signs reviewed and stable Respiratory status: spontaneous breathing, nonlabored ventilation, respiratory function stable and patient connected to nasal cannula oxygen Cardiovascular status: stable and blood pressure returned to baseline Postop Assessment: no apparent nausea or vomiting Anesthetic complications: no   No notable events documented.   Last Vitals:  Vitals:   07/13/23 0824 07/13/23 0830  BP: 130/71 131/71  Pulse: 82 84  Resp: (!) 21 (!) 22  Temp: (!) 36.3 C (!) 36.3 C  SpO2: 98% 97%    Last Pain:  Vitals:   07/13/23 0830  TempSrc:   PainSc: 0-No pain                 Jospeh Mangel C Jinna Weinman

## 2023-07-15 ENCOUNTER — Encounter: Payer: Self-pay | Admitting: Ophthalmology

## 2023-07-28 ENCOUNTER — Telehealth: Payer: Self-pay

## 2023-07-28 ENCOUNTER — Other Ambulatory Visit: Payer: Self-pay

## 2023-07-28 DIAGNOSIS — Z1211 Encounter for screening for malignant neoplasm of colon: Secondary | ICD-10-CM

## 2023-07-28 MED ORDER — GOLYTELY 236 G PO SOLR: 4000.0000 mL | Freq: Once | ORAL | 0 refills | Status: AC

## 2023-07-28 NOTE — Telephone Encounter (Signed)
Gastroenterology Pre-Procedure Review  Request Date: 08/20/23 Requesting Physician: Dr. Allegra Lai  PATIENT REVIEW QUESTIONS: The patient responded to the following health history questions as indicated:    1. Are you having any GI issues? no 2. Do you have a personal history of Polyps? no 3. Do you have a family history of Colon Cancer or Polyps? no 4. Diabetes Mellitus? yes (takes insulin and metformin has been advise to take half of the usual amount of insulin the day before and stop metformin 2 days before colonoscopy) 5. Joint replacements in the past 12 months?no 6. Major health problems in the past 3 months?no 7. Any artificial heart valves, MVP, or defibrillator?no    MEDICATIONS & ALLERGIES:    Patient reports the following regarding taking any anticoagulation/antiplatelet therapy:   Plavix, Coumadin, Eliquis, Xarelto, Lovenox, Pradaxa, Brilinta, or Effient? no Aspirin? 81 mg daily  Patient confirms/reports the following medications:  Current Outpatient Medications  Medication Sig Dispense Refill   polyethylene glycol (GOLYTELY) 236 g solution Take 4,000 mLs by mouth once for 1 dose. 4000 mL 0   ACCU-CHEK GUIDE test strip Use 1 strip via meter four times a day  to monitor blood glucose     Accu-Chek Softclix Lancets lancets      acetaminophen (TYLENOL) 325 MG tablet Take 650 mg by mouth every 6 (six) hours as needed.     atorvastatin (LIPITOR) 10 MG tablet Take 10 mg by mouth every evening.     DROPLET INSULIN SYRINGE 31G X 5/16" 0.3 ML MISC USE FIVE TIMES DAILY AS DIRECTED     FEROSUL 325 (65 Fe) MG tablet Take 325 mg by mouth 2 (two) times daily with a meal.     glucose blood (ACCU-CHEK AVIVA PLUS) test strip      insulin glargine (LANTUS) 100 UNIT/ML injection Inject 0.15 mLs (15 Units total) into the skin at bedtime. 10 mL 11   insulin lispro (HUMALOG) 100 UNIT/ML injection Inject 4 Units into the skin See admin instructions. Inject 4 units SQ with breakfast, inject 4 units SQ  with lunch and inject 4 units SQ with dinner     Insulin Syringe-Needle U-100 31G X 5/16" 0.3 ML MISC      lisinopril (ZESTRIL) 5 MG tablet Take 1 tablet (5 mg total) by mouth daily. 30 tablet 3   metFORMIN (GLUCOPHAGE) 1000 MG tablet Take 1,000 mg by mouth 2 (two) times daily with a meal.     Multiple Vitamins-Minerals (CENTROVITE) TABS Take 1 tablet by mouth daily. (Patient not taking: Reported on 07/03/2023)     polyethylene glycol (MIRALAX / GLYCOLAX) packet Take 17 g by mouth daily.     senna (SENOKOT) 8.6 MG TABS tablet Take 1 tablet by mouth 2 (two) times daily. (Patient not taking: Reported on 07/03/2023)     Simethicone (GAS-X ULTRA STRENGTH PO) Take 1 capsule by mouth 4 (four) times daily as needed (for flatulence).     No current facility-administered medications for this visit.    Patient confirms/reports the following allergies:  No Known Allergies  No orders of the defined types were placed in this encounter.   AUTHORIZATION INFORMATION Primary Insurance: 1D#: Group #:  Secondary Insurance: 1D#: Group #:  SCHEDULE INFORMATION: Date: 08/20/23 Time: Location: [facility]

## 2023-08-20 ENCOUNTER — Ambulatory Visit: Payer: Medicare HMO | Admitting: Anesthesiology

## 2023-08-20 ENCOUNTER — Ambulatory Visit
Admission: RE | Admit: 2023-08-20 | Discharge: 2023-08-20 | Disposition: A | Discharge status: Home / Self Care | Payer: Medicare HMO | Source: Ambulatory Visit | Attending: Gastroenterology | Admitting: Gastroenterology

## 2023-08-20 ENCOUNTER — Encounter
Admission: RE | Disposition: A | Discharge status: Home / Self Care | Payer: Self-pay | Source: Ambulatory Visit | Attending: Gastroenterology

## 2023-08-20 DIAGNOSIS — Z1211 Encounter for screening for malignant neoplasm of colon: Secondary | ICD-10-CM

## 2023-08-20 DIAGNOSIS — E1122 Type 2 diabetes mellitus with diabetic chronic kidney disease: Secondary | ICD-10-CM | POA: Insufficient documentation

## 2023-08-20 DIAGNOSIS — K635 Polyp of colon: Secondary | ICD-10-CM

## 2023-08-20 DIAGNOSIS — Z5986 Financial insecurity: Secondary | ICD-10-CM | POA: Insufficient documentation

## 2023-08-20 DIAGNOSIS — Z87891 Personal history of nicotine dependence: Secondary | ICD-10-CM | POA: Diagnosis not present

## 2023-08-20 DIAGNOSIS — K562 Volvulus: Secondary | ICD-10-CM

## 2023-08-20 DIAGNOSIS — I129 Hypertensive chronic kidney disease with stage 1 through stage 4 chronic kidney disease, or unspecified chronic kidney disease: Secondary | ICD-10-CM | POA: Insufficient documentation

## 2023-08-20 DIAGNOSIS — Z794 Long term (current) use of insulin: Secondary | ICD-10-CM | POA: Insufficient documentation

## 2023-08-20 DIAGNOSIS — K6389 Other specified diseases of intestine: Secondary | ICD-10-CM | POA: Diagnosis not present

## 2023-08-20 DIAGNOSIS — E1151 Type 2 diabetes mellitus with diabetic peripheral angiopathy without gangrene: Secondary | ICD-10-CM | POA: Insufficient documentation

## 2023-08-20 DIAGNOSIS — D123 Benign neoplasm of transverse colon: Secondary | ICD-10-CM | POA: Diagnosis not present

## 2023-08-20 DIAGNOSIS — Z7984 Long term (current) use of oral hypoglycemic drugs: Secondary | ICD-10-CM | POA: Diagnosis not present

## 2023-08-20 DIAGNOSIS — N183 Chronic kidney disease, stage 3 unspecified: Secondary | ICD-10-CM | POA: Insufficient documentation

## 2023-08-20 HISTORY — PX: COLONOSCOPY WITH PROPOFOL: SHX5780

## 2023-08-20 LAB — GLUCOSE, CAPILLARY: Glucose-Capillary: 99 mg/dL (ref 70–99)

## 2023-08-20 SURGERY — COLONOSCOPY WITH PROPOFOL
Anesthesia: General

## 2023-08-20 MED ORDER — LIDOCAINE HCL (PF) 2 % IJ SOLN
INTRAMUSCULAR | Status: AC
  Filled 2023-08-20: qty 5

## 2023-08-20 MED ORDER — SODIUM CHLORIDE 0.9 % IV SOLN: INTRAVENOUS | Status: DC

## 2023-08-20 MED ORDER — LIDOCAINE HCL (CARDIAC) PF 100 MG/5ML IV SOSY
PREFILLED_SYRINGE | INTRAVENOUS | Status: DC | PRN
  Administered 2023-08-20: 20 mg via INTRAVENOUS

## 2023-08-20 MED ORDER — PROPOFOL 500 MG/50ML IV EMUL
INTRAVENOUS | Status: DC | PRN
  Administered 2023-08-20: 120 ug/kg/min via INTRAVENOUS

## 2023-08-20 MED ORDER — PROPOFOL 10 MG/ML IV BOLUS
INTRAVENOUS | Status: DC | PRN
  Administered 2023-08-20: 50 mg via INTRAVENOUS
  Administered 2023-08-20: 30 mg via INTRAVENOUS

## 2023-08-20 NOTE — Op Note (Signed)
[facility] Gastroenterology Patient Name: [name] Procedure Date: 08/20/2023 11:47 AM MRN: [id number] Account #: [id number] Date of Birth: 1951/02/22 Admit Type: Outpatient Age: 72 Room: [facility] ENDO ROOM 3 Gender: Male Note Status: Finalized Instrument Name: Nelda Marseille [id number] Procedure:             Colonoscopy Indications:           Screening for colorectal malignant neoplasm, Last                         colonoscopy 10 years ago Providers:             Toney Reil MD, MD Referring MD:          Jeris Penta. Adamo (Referring MD) Medicines:             General Anesthesia Complications:         No immediate complications. Estimated blood loss: None. Procedure:             Pre-Anesthesia Assessment:                        - Prior to the procedure, a History and Physical was                         performed, and patient medications and allergies were                         reviewed. The patient is competent. The risks and                         benefits of the procedure and the sedation options and                         risks were discussed with the patient. All questions                         were answered and informed consent was obtained.                         Patient identification and proposed procedure were                         verified by the physician, the nurse, the                         anesthesiologist, the anesthetist and the technician                         in the pre-procedure area in the procedure room in the                         endoscopy suite. Mental Status Examination: alert and                         oriented. Airway Examination: normal oropharyngeal                         airway and neck mobility. Respiratory Examination:  clear to auscultation. CV Examination: normal.                         Prophylactic Antibiotics: The patient does not require                         prophylactic  antibiotics. Prior Anticoagulants: The                         patient has taken no anticoagulant or antiplatelet                         agents. ASA Grade Assessment: III - A patient with                         severe systemic disease. After reviewing the risks and                         benefits, the patient was deemed in satisfactory                         condition to undergo the procedure. The anesthesia                         plan was to use general anesthesia. Immediately prior                         to administration of medications, the patient was                         re-assessed for adequacy to receive sedatives. The                         heart rate, respiratory rate, oxygen saturations,                         blood pressure, adequacy of pulmonary ventilation, and                         response to care were monitored throughout the                         procedure. The physical status of the patient was                         re-assessed after the procedure.                        After obtaining informed consent, the colonoscope was                         passed under direct vision. Throughout the procedure,                         the patient's blood pressure, pulse, and oxygen                         saturations were monitored continuously. The  Colonoscope was introduced through the anus and                         advanced to the the cecum, identified by appendiceal                         orifice and ileocecal valve. The colonoscopy was                         unusually difficult due to inadequate bowel prep,                         significant looping and the patient's body habitus.                         Successful completion of the procedure was aided by                         applying abdominal pressure. The patient tolerated the                         procedure well. The quality of the bowel preparation                          was poor. The ileocecal valve, appendiceal orifice,                         and rectum were photographed. Findings:      The perianal and digital rectal examinations were normal. Pertinent       negatives include normal sphincter tone and no palpable rectal lesions.      Three sessile polyps were found in the transverse colon and ascending       colon. The polyps were 4 to 5 mm in size. These polyps were removed with       a cold snare. Resection and retrieval were complete. Estimated blood       loss: none.      The retroflexed view of the distal rectum and anal verge was normal and       showed no anal or rectal abnormalities.      Copious quantities of liquid stool was found in the entire colon,       precluding visualization. Impression:            - Preparation of the colon was poor.                        - Three 4 to 5 mm polyps in the transverse colon and                         in the ascending colon, removed with a cold snare.                         Resected and retrieved.                        - The distal rectum and anal verge are normal on  retroflexion view.                        - Stool in the entire examined colon. Recommendation:        - Discharge patient to home (with escort).                        - Resume previous diet today.                        - Continue present medications.                        - Await pathology results.                        - Repeat colonoscopy in 6 months with 2day prep                         because the bowel preparation was suboptimal. Procedure Code(s):     --- Professional ---                        [phone], Colonoscopy, flexible; with removal of                         tumor(s), polyp(s), or other lesion(s) by snare                         technique Diagnosis Code(s):     --- Professional ---                        Z12.11, Encounter for screening for malignant neoplasm                         of colon                         D12.4, Benign neoplasm of descending colon                        D12.3, Benign neoplasm of transverse colon (hepatic                         flexure or splenic flexure)                        D12.2, Benign neoplasm of ascending colon CPT copyright 2022 American [employer]. All rights reserved. The codes documented in this report are preliminary and upon coder review may  be revised to meet current compliance requirements. Dr. Libby Maw Toney Reil MD, MD 08/20/2023 12:28:33 PM This report has been signed electronically. Number of Addenda: 0 Note Initiated On: 08/20/2023 11:47 AM Scope Withdrawal Time: 0 hours 7 minutes 57 seconds  Total Procedure Duration: 0 hours 14 minutes 59 seconds  Estimated Blood Loss:  Estimated blood loss: none.      [facility]

## 2023-08-20 NOTE — Transfer of Care (Signed)
Immediate Anesthesia Transfer of Care Note  Patient: [name]  Procedure(s) Performed: COLONOSCOPY WITH PROPOFOL  Patient Location: PACU  Anesthesia Type:General  Level of Consciousness: sedated  Airway & Oxygen Therapy: Patient Spontanous Breathing and Patient connected to nasal cannula oxygen  Post-op Assessment: Report given to RN and Post -op Vital signs reviewed and stable  Post vital signs: Reviewed and stable  Last Vitals:  Vitals Value Taken Time  BP 117/74 08/20/23 1223  Temp 35.8 C 08/20/23 1223  Pulse 76 08/20/23 1223  Resp 19 08/20/23 1223  SpO2 95 % 08/20/23 1223    Last Pain:  Vitals:   08/20/23 1223  TempSrc: Temporal  PainSc: Asleep         Complications: No notable events documented.

## 2023-08-20 NOTE — H&P (Signed)
Arlyss Repress, MD [address]  [address]  [place], [place] [address]  Main: [phone]  Fax: [phone] Pager: [phone]  Primary Care Physician:  Abram Sander, MD Primary Gastroenterologist:  Dr. Arlyss Repress  Pre-Procedure History & Physical: HPI:  [name] is a 72 y.o. male is here for an colonoscopy.   Past Medical History:  Diagnosis Date   Arthritis    Bladder incontinence    Chronic kidney disease    Stage 3 Kidney disease   Diabetes mellitus without complication (HCC)    DVT of leg (deep venous thrombosis) (HCC)    H/O RIGHT LEG 2018   Dyspnea    occassional   Hyperlipidemia    Hypertension    Peripheral vascular disease (HCC)    Sepsis (HCC)     Past Surgical History:  Procedure Laterality Date   ABDOMINAL AORTOGRAM W/LOWER EXTREMITY N/A 10/10/2016   Procedure: Abdominal Aortogram w/Lower Extremity;  Surgeon: Renford Dills, MD;  Location: [facility];  Service: Cardiovascular;  Laterality: N/A;   CATARACT EXTRACTION W/PHACO Left 05/27/2018   Procedure: CATARACT EXTRACTION PHACO AND INTRAOCULAR LENS PLACEMENT (IOC);  Surgeon: Nevada Crane, MD;  Location: [facility];  Service: Ophthalmology;  Laterality: Left;  [place] 03:06.2 AP% 16.1 CDE 30.16 FLUID PACK LOT @ [id number] H   CATARACT EXTRACTION W/PHACO Right 07/13/2023   Procedure: CATARACT EXTRACTION PHACO AND INTRAOCULAR LENS PLACEMENT (IOC) RIGHT DIABETIC 4.00 00:36.6;  Surgeon: Nevada Crane, MD;  Location: [facility];  Service: Ophthalmology;  Laterality: Right;   CIRCUMCISION  1998   EYE SURGERY Left    Retina Detachment   GRAFT APPLICATION Right 05/29/2017   Procedure: GRAFT APPLICATION ( RIGHT FOOT );  Surgeon: Renford Dills, MD;  Location: [facility];  Service: Vascular;  Laterality: Right;  graft taken from patients right thigh   IRRIGATION AND DEBRIDEMENT FOOT Right 10/03/2016   Procedure: IRRIGATION AND DEBRIDEMENT FOOT;  Surgeon: Gwyneth Revels, DPM;   Location: [facility];  Service: Podiatry;  Laterality: Right;   LOWER EXTREMITY ANGIOGRAPHY Right 02/24/2023   Procedure: Lower Extremity Angiography;  Surgeon: Renford Dills, MD;  Location: [facility];  Service: Cardiovascular;  Laterality: Right;   LOWER EXTREMITY INTERVENTION  10/10/2016   Procedure: Lower Extremity Intervention;  Surgeon: Renford Dills, MD;  Location: [facility];  Service: Cardiovascular;;   PERIPHERAL VASCULAR BALLOON ANGIOPLASTY Left 10/10/2016   Procedure: Peripheral Vascular Balloon Angioplasty;  Surgeon: Renford Dills, MD;  Location: [facility];  Service: Cardiovascular;  Laterality: Left;   TONSILLECTOMY     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/2022   Procedure: TRANSMETATARSAL AMPUTATION;  Surgeon: Gwyneth Revels, DPM;  Location: [facility];  Service: Podiatry;  Laterality: Right;   WOUND DEBRIDEMENT Right 11/07/2016   Procedure: DEBRIDEMENT OF WOUND AND BONE RIGHT FOOT AND APPLY WOUND VAC;  Surgeon: Gwyneth Revels, DPM;  Location: [facility];  Service: Podiatry;  Laterality: Right;    Prior to Admission medications   Medication Sig Start Date End Date Taking? Authorizing Provider  aspirin EC 81 MG tablet Take 81 mg by mouth daily. Swallow whole.   Yes [provider]  atorvastatin (LIPITOR) 10 MG tablet Take 10 mg by mouth every evening. 06/12/20  Yes [provider]  glucose blood (ACCU-CHEK AVIVA PLUS) test strip  04/29/20  Yes [provider]  insulin glargine (LANTUS) 100 UNIT/ML injection Inject 0.15 mLs (15 Units total) into the skin at bedtime. 10/13/16  Yes Enid Baas, MD  insulin lispro (HUMALOG) 100 UNIT/ML injection Inject 4 Units into the skin See admin instructions. Inject 4 units SQ with breakfast, inject 4 units SQ with lunch and inject 4 units SQ with dinner   Yes [provider]  lisinopril (ZESTRIL) 5 MG tablet Take 1 tablet (5 mg total) by mouth daily. 01/08/22  Yes Enedina Finner,  MD  metFORMIN (GLUCOPHAGE) 1000 MG tablet Take 1,000 mg by mouth 2 (two) times daily with a meal. 06/12/20  Yes [provider]  ACCU-CHEK GUIDE test strip Use 1 strip via meter four times a day  to monitor blood glucose 07/10/20   [provider]  Accu-Chek Softclix Lancets lancets  04/29/20   [provider]  acetaminophen (TYLENOL) 325 MG tablet Take 650 mg by mouth every 6 (six) hours as needed.    [provider]  DROPLET INSULIN SYRINGE 31G X 5/16" 0.3 ML MISC USE FIVE TIMES DAILY AS DIRECTED 06/12/20   [provider]  FEROSUL 325 (65 Fe) MG tablet Take 325 mg by mouth 2 (two) times daily with a meal. 12/26/21   [provider]  Insulin Syringe-Needle U-100 31G X 5/16" 0.3 ML MISC  06/05/20   [provider]  Multiple Vitamins-Minerals (CENTROVITE) TABS Take 1 tablet by mouth daily. Patient not taking: Reported on 07/03/2023    [provider]  polyethylene glycol (MIRALAX / GLYCOLAX) packet Take 17 g by mouth daily.    [provider]  senna (SENOKOT) 8.6 MG TABS tablet Take 1 tablet by mouth 2 (two) times daily. Patient not taking: Reported on 07/03/2023    [provider]  Simethicone (GAS-X ULTRA STRENGTH PO) Take 1 capsule by mouth 4 (four) times daily as needed (for flatulence).    [provider]    Allergies as of 07/28/2023   (No Known Allergies)    Family History  Problem Relation Age of Onset   Diabetes Mother    Hypertension Mother    Diabetes Father    Hypertension Father     Social History   Socioeconomic History   Marital status: Single    Spouse name: Not on file   Number of children: Not on file   Years of education: Not on file   Highest education level: Not on file  Occupational History   Not on file  Tobacco Use   Smoking status: Former    Types: Pipe    Quit date: 05/23/1975    Years since quitting: 48.2   Smokeless tobacco: Never  Vaping Use   Vaping  status: Never Used  Substance and Sexual Activity   Alcohol use: No   Drug use: No   Sexual activity: Not on file  Other Topics Concern   Not on file  Social History Narrative   Not on file   Social Drivers of Health   Financial Resource Strain: Medium Risk (08/06/2023)   Received from [facility]   Overall Financial Resource Strain (CARDIA)    Difficulty of Paying Living Expenses: Somewhat hard  Food Insecurity: Food Insecurity Present (08/06/2023)   Received from [facility]   Hunger Vital Sign    Worried About Running Out of Food in the Last Year: Never true    Ran Out of Food in the Last Year: Sometimes true  Transportation Needs: No Transportation Needs (08/06/2023)   Received from [facility] - Transportation    In the  past 12 months, has lack of transportation kept you from medical appointments or from getting medications?: No    Lack of Transportation (Non-Medical): No  Physical Activity: Not on file  Stress: Not on file  Social Connections: Not on file  Intimate Partner Violence: Not At Risk (02/19/2023)   Humiliation, Afraid, Rape, and Kick questionnaire    Fear of Current or Ex-Partner: No    Emotionally Abused: No    Physically Abused: No    Sexually Abused: No    Review of Systems: See HPI, otherwise negative ROS  Physical Exam: BP 137/77   Pulse 76   Temp (!) 97 F (36.1 C) (Temporal)   Resp 20   Ht 5\' 11"  (1.803 m)   Wt (!) 139.3 kg   SpO2 98%   BMI 42.82 kg/m  General:   Alert,  pleasant and cooperative in NAD Head:  Normocephalic and atraumatic. Neck:  Supple; no masses or thyromegaly. Lungs:  Clear throughout to auscultation.    Heart:  Regular rate and rhythm. Abdomen:  Soft, nontender and nondistended. Normal bowel sounds, without guarding, and without rebound.   Neurologic:  Alert and  oriented x4;  grossly normal neurologically.  Impression/Plan: [name] is here for  an colonoscopy to be performed for colon cancer screening  Risks, benefits, limitations, and alternatives regarding  colonoscopy have been reviewed with the patient.  Questions have been answered.  All parties agreeable.   Lannette Donath, MD  08/20/2023, 11:12 AM

## 2023-08-20 NOTE — Anesthesia Preprocedure Evaluation (Signed)
Anesthesia Evaluation  Patient identified by MRN, date of birth, ID band Patient awake    Reviewed: Allergy & Precautions, NPO status , Patient's Chart, lab work & pertinent test results  History of Anesthesia Complications Negative for: history of anesthetic complications  Airway Mallampati: III  TM Distance: >3 FB Neck ROM: full    Dental  (+) Chipped, Poor Dentition, Missing   Pulmonary neg shortness of breath, former smoker   Pulmonary exam normal        Cardiovascular Exercise Tolerance: Good hypertension, (-) angina Normal cardiovascular exam     Neuro/Psych negative neurological ROS  negative psych ROS   GI/Hepatic negative GI ROS, Neg liver ROS,,,  Endo/Other  diabetes, Type 2    Renal/GU Renal disease  negative genitourinary   Musculoskeletal   Abdominal   Peds  Hematology negative hematology ROS (+)   Anesthesia Other Findings Past Medical History: No date: Arthritis No date: Bladder incontinence No date: Chronic kidney disease     Comment:  Stage 3 Kidney disease No date: Diabetes mellitus without complication (HCC) No date: DVT of leg (deep venous thrombosis) (HCC)     Comment:  H/O RIGHT LEG 2018 No date: Dyspnea     Comment:  occassional No date: Hyperlipidemia No date: Hypertension No date: Peripheral vascular disease (HCC) No date: Sepsis (HCC)  Past Surgical History: 10/10/2016: ABDOMINAL AORTOGRAM W/LOWER EXTREMITY; N/A     Comment:  Procedure: Abdominal Aortogram w/Lower Extremity;                Surgeon: Renford Dills, MD;  Location: [facility]               [facility];  Service: Cardiovascular;  Laterality: N/A; 05/27/2018: CATARACT EXTRACTION W/PHACO; Left     Comment:  Procedure: CATARACT EXTRACTION PHACO AND INTRAOCULAR               LENS PLACEMENT (IOC);  Surgeon: Nevada Crane, MD;                Location: [facility];  Service: Ophthalmology;  Laterality:               Left;  [place] 03:06.2 AP% 16.1 CDE 30.16 FLUID PACK LOT @               [id number] H 07/13/2023: CATARACT EXTRACTION W/PHACO; Right     Comment:  Procedure: CATARACT EXTRACTION PHACO AND INTRAOCULAR               LENS PLACEMENT (IOC) RIGHT DIABETIC 4.00 00:36.6;                Surgeon: Nevada Crane, MD;  Location: [facility];  Service: Ophthalmology;  Laterality:               Right; 1998: CIRCUMCISION No date: EYE SURGERY; Left     Comment:  Retina Detachment 05/29/2017: GRAFT APPLICATION; Right     Comment:  Procedure: GRAFT APPLICATION ( RIGHT FOOT );  Surgeon:               Renford Dills, MD;  Location: [facility];  Service:               Vascular;  Laterality: Right;  graft taken from patients               right thigh 10/03/2016: IRRIGATION AND DEBRIDEMENT FOOT; Right  Comment:  Procedure: IRRIGATION AND DEBRIDEMENT FOOT;  Surgeon:               Gwyneth Revels, DPM;  Location: [facility];  Service:               Podiatry;  Laterality: Right; 02/24/2023: LOWER EXTREMITY ANGIOGRAPHY; Right     Comment:  Procedure: Lower Extremity Angiography;  Surgeon:               Renford Dills, MD;  Location: [facility];               Service: Cardiovascular;  Laterality: Right; 10/10/2016: LOWER EXTREMITY INTERVENTION     Comment:  Procedure: Lower Extremity Intervention;  Surgeon:               Renford Dills, MD;  Location: [facility];                Service: Cardiovascular;; 10/10/2016: PERIPHERAL VASCULAR BALLOON ANGIOPLASTY; Left     Comment:  Procedure: Peripheral Vascular Balloon Angioplasty;                Surgeon: Renford Dills, MD;  Location: [facility]               CV LAB;  Service: Cardiovascular;  Laterality: Left; No date: TONSILLECTOMY 01/05/2022: TRANSMETATARSAL AMPUTATION; Right     Comment:  Procedure: TRANSMETATARSAL AMPUTATION;  Surgeon: Gwyneth Revels, DPM;  Location: [facility];  Service: Podiatry;                 Laterality: Right; 11/07/2016: WOUND DEBRIDEMENT; Right     Comment:  Procedure: DEBRIDEMENT OF WOUND AND BONE RIGHT FOOT AND               APPLY WOUND VAC;  Surgeon: Gwyneth Revels, DPM;  Location:              [facility];  Service: Podiatry;  Laterality: Right;     Reproductive/Obstetrics negative OB ROS                             Anesthesia Physical Anesthesia Plan  ASA: 3  Anesthesia Plan: General   Post-op Pain Management:    Induction: Intravenous  PONV Risk Score and Plan: Propofol infusion and TIVA  Airway Management Planned: Natural Airway and Nasal Cannula  Additional Equipment:   Intra-op Plan:   Post-operative Plan:   Informed Consent: I have reviewed the patients History and Physical, chart, labs and discussed the procedure including the risks, benefits and alternatives for the proposed anesthesia with the patient or authorized representative who has indicated his/her understanding and acceptance.     Dental Advisory Given  Plan Discussed with: Anesthesiologist, CRNA and Surgeon  Anesthesia Plan Comments: (Patient consented for risks of anesthesia including but not limited to:  - adverse reactions to medications - risk of airway placement if required - damage to eyes, teeth, lips or other oral mucosa - nerve damage due to positioning  - sore throat or hoarseness - Damage to heart, brain, nerves, lungs, other parts of body or loss of life  Patient voiced understanding and assent.)       Anesthesia Quick Evaluation

## 2023-08-20 NOTE — Anesthesia Postprocedure Evaluation (Signed)
Anesthesia Post Note  Patient: [name]  Procedure(s) Performed: COLONOSCOPY WITH PROPOFOL  Patient location during evaluation: Endoscopy Anesthesia Type: General Level of consciousness: awake and alert Pain management: pain level controlled Vital Signs Assessment: post-procedure vital signs reviewed and stable Respiratory status: spontaneous breathing, nonlabored ventilation, respiratory function stable and patient connected to nasal cannula oxygen Cardiovascular status: blood pressure returned to baseline and stable Postop Assessment: no apparent nausea or vomiting Anesthetic complications: no   No notable events documented.   Last Vitals:  Vitals:   08/20/23 1223 08/20/23 1233  BP: 117/74 112/74  Pulse: 76   Resp: 19 (!) 24  Temp: (!) 35.8 C   SpO2: 95% 96%    Last Pain:  Vitals:   08/20/23 1233  TempSrc:   PainSc: 0-No pain                 Cleda Mccreedy Larayah Clute

## 2023-08-21 ENCOUNTER — Encounter: Payer: Self-pay | Admitting: Gastroenterology

## 2023-08-21 LAB — SURGICAL PATHOLOGY

## 2023-08-25 ENCOUNTER — Encounter: Payer: Self-pay | Admitting: Gastroenterology

## 2024-01-28 NOTE — Progress Notes (Signed)
 Chief Complaint:   Chief Complaint  Patient presents with  . Follow-up    Recheck bilateral ulcers     Subjective  HPI  [name] is a 73 y.o. male who presents for Follow-up (Recheck bilateral ulcers ) History of Present Illness The patient presents with a wound on the leg and lymphedema.  He has an open wound on the leg that has been present for a while but is now smaller than before. The wound is draining fluid, which is expected with an open wound. No symptoms of infection such as increased redness, pus, swelling, or foul odor.  He reports numbness in the foot and a lump on his leg.  Review of Systems  Patient Active Problem List  Diagnosis  . Chronic kidney disease (CKD) stage G1/A3, glomerular filtration rate (GFR) equal to or greater than 90 mL/min/1.73 square meter and albuminuria creatinine ratio greater than 300 mg/g  . Male erectile dysfunction  . HLD (hyperlipidemia)  . Hypertension  . Obesity  . Septic shock (CMS/HHS-HCC)  . Acute renal failure superimposed on stage 3a chronic kidney disease (CMS/HHS-HCC)  . Atherosclerosis of native arteries of extremity with intermittent claudication ()  . Chronic venous insufficiency  . Cramp and spasm  . Diabetes (CMS/HHS-HCC)  . Encounter for general adult medical examination without abnormal findings  . Hyperkalemia  . Iron  deficiency anemia  . Lymphedema  . Onychomycosis  . Osteoarthritis, knee  . Pressure injury of skin  . 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 Venous ulcer (CMS/HHS-HCC)  . Atherosclerosis of native arteries of the extremities with ulceration (CMS/HHS-HCC)  . Polyp of ascending colon    Outpatient Medications Prior to Visit  Medication Sig Dispense Refill  . ACCU-CHEK AVIVA PLUS TEST STRP test strip     . ACCU-CHEK SOFTCLIX LANCETS lancets     . aspirin  81 MG EC tablet Take 81 mg by mouth once daily    . atorvastatin  (LIPITOR) 10 MG tablet     .  calcium  alginate 4 X 4  Bndg APPLY TO WOUNDS THEN COVER WITH FOAM DRESSING EVERY OTHER DAY 30 each 1  . docusate (COLACE) 100 MG capsule Take by mouth.    . DROPLET INSULIN  SYRINGE 0.3 mL 31 gauge x 5/16 syringe     . ferrous sulfate  325 (65 FE) MG tablet Take by mouth    . HUMALOG U-100 INSULIN  injection (concentration 100 units/mL)     . insulin  GLARGINE (LANTUS ) injection (concentration 100 units/mL) Inject subcutaneously.    . insulin  LISPRO PROTAMINE-LISPRO (HUMALOG KWIKPEN MIX 75/25) 100 unit/mL (75-25) pen injector Inject subcutaneously 2 (two) times daily before meals    . JARDIANCE 10 mg tablet     . lisinopriL  (ZESTRIL ) 10 MG tablet Take 10 mg by mouth once daily    . lisinopriL  (ZESTRIL ) 5 MG tablet Take 5 mg by mouth once daily    . lisinopril -hydrochlorothiazide (PRINZIDE,ZESTORETIC) 20-25 mg tablet Take 1 tablet by mouth once daily.    . metFORMIN (GLUCOPHAGE) 1000 MG tablet Take by mouth.    . silver  sulfADIAZINE  (SSD) 1 % cream APPLY TO RIGHT FOOT ULCER TWICE A DAY    . TRUE METRIX AIR GLUCOSE METER kit     . TRUEPLUS INSULIN  0.5 mL 31 gauge x 5/16 syringe      No facility-administered medications prior to visit.      Objective   Vitals:   01/28/24 1354  BP: 112/61  Weight: (!) 129.3 kg (  285 lb 0.9 oz)  Height: 180.3 cm (5' 10.98)  PainSc: 0-No pain   Body mass index is 39.78 kg/m.  Home Vitals:     Physical Exam Physical Exam EXTREMITIES: Lymphedema with fluid accumulation above the sock line. SKIN: Wound on leg with dead tissue trimmed, measuring 1.8 cm by 1.1 cm, stable, not infected.    The following labs were personally reviewed: -No results found for: WBC, HGB, HCT, PLT -No results found for: NA, K, CL, CO2, BUN, CREATININE, GLUCOSE -No results found for: NA, K, CL, CO2, BUN, CREATININE, CALCIUM , TP, ALB, TBILI, ALKPHOS, AST, ALT, GLUCOSE, GFR - Lab Results  Component Value Date   HGBA1C 8.7  12/23/2021      Results Procedure: Debridement of wound Debridment of ulcer: Location: Plantar right foot Pre-debridement measurement: 1.8 x 1.0 cm Post-debridement measurement: 1.8 x 1.1 cm Tissue removed:   Fibrotic nonviable and necrotic Ulcer was debrided sharply with combination of tissue nippers and scalpel blade to and including Subcutaneous Tissue       Assessment/Plan:   Assessment & Plan Ulcer of right foot with fat layer exposed Chronic ulcer on the right foot with exposed fat layer, measuring 1.8 cm by 1.1 cm. The wound is well-managed, with no signs of infection such as erythema, purulent discharge, edema, or malodor. Surgical intervention to shave bone is deferred due to the risk of creating a new wound and the current stability of the ulcer. - Trim necrotic tissue as needed to promote healing. - Apply a pad bandage to the ulcer. - Avoid surgical intervention unless necessary due to the risk of creating a new wound.  Lymphedema Lymphedema exacerbated by socks pushing fluid above the compression area. Compression stockings are not used due to the foot ulcer. - Consider compression therapy once the ulcer is healed.  Neuropathy Significant neuropathy in the lower extremities, contributing to numbness in the foot. - Continue current management of neuropathy.  Diagnoses and all orders for this visit:  Ulcer of right foot with fat layer exposed (CMS/HHS-HCC)  Type 2 diabetes mellitus with foot ulcer, without long-term current use of insulin  (CMS/HHS-HCC)  Status post amputation of toe of right foot (CMS/HHS-HCC)          Future Appointments     Date/Time Provider Department Center Visit Type   03/10/2024 1:45 PM Ashley Eva Dawn, DPM [facility] RETURN PODIATRY       There are no Patient Instructions on file for this visit.   This note has been created using automated tools and reviewed for accuracy by JUSTIN ALLEN FOWLER.

## 2024-02-22 ENCOUNTER — Other Ambulatory Visit: Payer: Self-pay

## 2024-02-22 ENCOUNTER — Telehealth: Payer: Self-pay

## 2024-02-22 DIAGNOSIS — Z860100 Personal history of colon polyps, unspecified: Secondary | ICD-10-CM

## 2024-02-22 MED ORDER — PEG 3350-KCL-NA BICARB-NACL 420 G PO SOLR: 4000.0000 mL | Freq: Once | ORAL | 0 refills | Status: AC

## 2024-02-22 NOTE — Telephone Encounter (Signed)
 Gastroenterology Pre-Procedure Review  Request Date: 05/17/24 Requesting Physician: Dr. Jinny  PATIENT REVIEW QUESTIONS: The patient responded to the following health history questions as indicated:    1. Are you having any GI issues? no 2. Do you have a personal history of Polyps? yes (last colonoscopy performed by Dr. Unk 08/19/24 recommended repeat in 6 months.) 3. Do you have a family history of Colon Cancer or Polyps? no 4. Diabetes Mellitus? yes (takes metformin and lantus  and humalog pt advised of stop dates verbally and noted instructions for insulin  and metformin in instructions) 5. Joint replacements in the past 12 months?no 6. Major health problems in the past 3 months?no 7. Any artificial heart valves, MVP, or defibrillator?no    MEDICATIONS & ALLERGIES:    Patient reports the following regarding taking any anticoagulation/antiplatelet therapy:   Plavix, Coumadin, Eliquis, Xarelto, Lovenox, Pradaxa, Brilinta, or Effient? no Aspirin ? yes (81 mg daily)  Patient confirms/reports the following medications:  Current Outpatient Medications  Medication Sig Dispense Refill   ACCU-CHEK GUIDE test strip Use 1 strip via meter four times a day  to monitor blood glucose     Accu-Chek Softclix Lancets lancets      acetaminophen  (TYLENOL ) 325 MG tablet Take 650 mg by mouth every 6 (six) hours as needed.     aspirin  EC 81 MG tablet Take 81 mg by mouth daily. Swallow whole.     atorvastatin  (LIPITOR) 10 MG tablet Take 10 mg by mouth every evening.     DROPLET INSULIN  SYRINGE 31G X 5/16 0.3 ML MISC USE FIVE TIMES DAILY AS DIRECTED     FEROSUL 325 (65 Fe) MG tablet Take 325 mg by mouth 2 (two) times daily with a meal.     glucose blood (ACCU-CHEK AVIVA PLUS) test strip      insulin  glargine (LANTUS ) 100 UNIT/ML injection Inject 0.15 mLs (15 Units total) into the skin at bedtime. 10 mL 11   insulin  lispro (HUMALOG) 100 UNIT/ML injection Inject 4 Units into the skin See admin instructions.  Inject 4 units SQ with breakfast, inject 4 units SQ with lunch and inject 4 units SQ with dinner     Insulin  Syringe-Needle U-100 31G X 5/16 0.3 ML MISC      lisinopril  (ZESTRIL ) 5 MG tablet Take 1 tablet (5 mg total) by mouth daily. 30 tablet 3   metFORMIN (GLUCOPHAGE) 1000 MG tablet Take 1,000 mg by mouth 2 (two) times daily with a meal.     Multiple Vitamins-Minerals (CENTROVITE) TABS Take 1 tablet by mouth daily. (Patient not taking: Reported on 07/03/2023)     polyethylene glycol (MIRALAX  / GLYCOLAX ) packet Take 17 g by mouth daily.     senna (SENOKOT) 8.6 MG TABS tablet Take 1 tablet by mouth 2 (two) times daily. (Patient not taking: Reported on 07/03/2023)     Simethicone  (GAS-X ULTRA STRENGTH PO) Take 1 capsule by mouth 4 (four) times daily as needed (for flatulence).     No current facility-administered medications for this visit.    Patient confirms/reports the following allergies:  No Known Allergies  No orders of the defined types were placed in this encounter.   AUTHORIZATION INFORMATION Primary Insurance: 1D#: Group #:  Secondary Insurance: 1D#: Group #:  SCHEDULE INFORMATION: Date: 05/17/24 Time: Location: [facility]

## 2024-03-10 NOTE — Progress Notes (Signed)
 Chief Complaint:   Chief Complaint  Patient presents with  . Follow-up    Right foot ulcer     Subjective  HPI  [name] is a 73 y.o. male who presents for Follow-up (Right foot ulcer ) History of Present Illness [name] is a 73 year old male who presents with a chronic foot ulcer.  He has a chronic ulcer on his foot. The healing process has been slow. He took a week off from his usual routine in June.  He is concerned about the duration of bleeding from the ulcer and is unsure when it might stop completely.  He recalls that his last visit was six weeks ago, although there is some confusion about the exact timing of the previous appointment.  Review of Systems  Patient Active Problem List  Diagnosis  . Chronic kidney disease (CKD) stage G1/A3, glomerular filtration rate (GFR) equal to or greater than 90 mL/min/1.73 square meter and albuminuria creatinine ratio greater than 300 mg/g  . Male erectile dysfunction  . HLD (hyperlipidemia)  . Hypertension  . Obesity  . Septic shock (CMS/HHS-HCC)  . Acute renal failure superimposed on stage 3a chronic kidney disease (CMS/HHS-HCC)  . Atherosclerosis of native arteries of extremity with intermittent claudication ()  . Chronic venous insufficiency  . Cramp and spasm  . Diabetes (CMS/HHS-HCC)  . Encounter for general adult medical examination without abnormal findings  . Hyperkalemia  . Iron  deficiency anemia  . Lymphedema  . Onychomycosis  . Osteoarthritis, knee  . Pressure injury of skin  . 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 Venous ulcer (CMS/HHS-HCC)  . Atherosclerosis of native arteries of the extremities with ulceration (CMS/HHS-HCC)  . Polyp of ascending colon    Outpatient Medications Prior to Visit  Medication Sig Dispense Refill  . ACCU-CHEK AVIVA PLUS TEST STRP test strip     . ACCU-CHEK SOFTCLIX LANCETS lancets     . aspirin  81 MG EC tablet Take 81 mg by  mouth once daily    . atorvastatin  (LIPITOR) 10 MG tablet     . calcium  alginate 4 X 4  Bndg APPLY TO WOUNDS THEN COVER WITH FOAM DRESSING EVERY OTHER DAY 30 each 1  . docusate (COLACE) 100 MG capsule Take by mouth.    . DROPLET INSULIN  SYRINGE 0.3 mL 31 gauge x 5/16 syringe     . ferrous sulfate  325 (65 FE) MG tablet Take by mouth    . HUMALOG U-100 INSULIN  injection (concentration 100 units/mL)     . insulin  GLARGINE (LANTUS ) injection (concentration 100 units/mL) Inject subcutaneously.    . insulin  LISPRO PROTAMINE-LISPRO (HUMALOG KWIKPEN MIX 75/25) 100 unit/mL (75-25) pen injector Inject subcutaneously 2 (two) times daily before meals    . JARDIANCE 10 mg tablet     . lisinopriL  (ZESTRIL ) 10 MG tablet Take 10 mg by mouth once daily    . lisinopriL  (ZESTRIL ) 5 MG tablet Take 5 mg by mouth once daily    . lisinopril -hydrochlorothiazide (PRINZIDE,ZESTORETIC) 20-25 mg tablet Take 1 tablet by mouth once daily.    . metFORMIN (GLUCOPHAGE) 1000 MG tablet Take by mouth.    . silver  sulfADIAZINE  (SSD) 1 % cream APPLY TO RIGHT FOOT ULCER TWICE A DAY    . TRUE METRIX AIR GLUCOSE METER kit     . TRUEPLUS INSULIN  0.5 mL 31 gauge x 5/16 syringe      No facility-administered medications prior to visit.      Objective  Vitals:   03/10/24 1348  BP: 134/79  Weight: (!) 129.3 kg (285 lb 0.9 oz)  Height: 180.3 cm (5' 10.98)  PainSc: 0-No pain   Body mass index is 39.78 kg/m.  Home Vitals:     1.5 x 1.0 cm to the plantar aspect of the right foot.  Healing nicely.  No signs of infection.  Surrounding nonviable hyperkeratotic tissue.     The following labs were personally reviewed: -No results found for: WBC, HGB, HCT, PLT -No results found for: NA, K, CL, CO2, BUN, CREATININE, GLUCOSE -No results found for: NA, K, CL, CO2, BUN, CREATININE, CALCIUM , TP, ALB, TBILI, ALKPHOS, AST, ALT, GLUCOSE, GFR - Lab Results  Component Value Date    HGBA1C 8.7 12/23/2021      Results Procedure: Wound debridement Description: Trimmed the edge of the ulcer. Ulcer measured approximately 1.5 centimeters in width and 1 centimeter in diameter. Ulcer appeared to be healing and filled in quite a bit.  DIAGNOSTIC (03/10/2024)     Assessment/Plan:   Assessment & Plan Ulcer of right foot The ulcer on the right foot is healing slowly but steadily, with a reduction in size to approximately 1.5 cm in width and 1 cm in diameter. It is located on the plantar surface, a challenging area for healing. The wound has filled in significantly, and the edges were trimmed during the visit. - Continue using antibacterial soap, such as Dial, to wash the foot. - Bandage the ulcer appropriately. - Schedule follow-up appointment in two months to monitor healing progress.  Diagnoses and all orders for this visit:  Ulcer of right foot with fat layer exposed (CMS/HHS-HCC)  Type 2 diabetes mellitus with foot ulcer, without long-term current use of insulin  (CMS/HHS-HCC)          Future Appointments   This patient does not currently have any appointments scheduled.     There are no Patient Instructions on file for this visit.   This note has been created using automated tools and reviewed for accuracy by JUSTIN ALLEN FOWLER.

## 2024-04-14 NOTE — Progress Notes (Signed)
 [facility] Care Management - Unable To Reach Enrolled Patient  [facility] Coordinator attempted to contact [name] to discuss community resources. Contact information left on voicemail.  Follow up: [facility] Coordinator will continue to attempt to reach patient.  JODY  JACKSON    [address]; [place], [place] [address] l  [email or web address] l [phone]   For more information on [facility] services, click here.

## 2024-04-16 ENCOUNTER — Emergency Department

## 2024-04-16 ENCOUNTER — Inpatient Hospital Stay
Admission: EM | Admit: 2024-04-16 | Discharge: 2024-04-23 | DRG: 477 | Disposition: A | Discharge status: Skilled Nursing Facility | Attending: Internal Medicine | Admitting: Internal Medicine

## 2024-04-16 ENCOUNTER — Encounter: Payer: Self-pay | Admitting: Intensive Care

## 2024-04-16 ENCOUNTER — Emergency Department
Admission: EM | Admit: 2024-04-16 | Discharge: 2024-04-16 | Disposition: A | Discharge status: Home / Self Care | Attending: Emergency Medicine | Admitting: Emergency Medicine

## 2024-04-16 ENCOUNTER — Other Ambulatory Visit: Payer: Self-pay

## 2024-04-16 DIAGNOSIS — I129 Hypertensive chronic kidney disease with stage 1 through stage 4 chronic kidney disease, or unspecified chronic kidney disease: Secondary | ICD-10-CM | POA: Diagnosis present

## 2024-04-16 DIAGNOSIS — Z860100 Personal history of colon polyps, unspecified: Secondary | ICD-10-CM

## 2024-04-16 DIAGNOSIS — Z8249 Family history of ischemic heart disease and other diseases of the circulatory system: Secondary | ICD-10-CM

## 2024-04-16 DIAGNOSIS — Y92512 Supermarket, store or market as the place of occurrence of the external cause: Secondary | ICD-10-CM | POA: Insufficient documentation

## 2024-04-16 DIAGNOSIS — E119 Type 2 diabetes mellitus without complications: Secondary | ICD-10-CM

## 2024-04-16 DIAGNOSIS — D649 Anemia, unspecified: Secondary | ICD-10-CM | POA: Insufficient documentation

## 2024-04-16 DIAGNOSIS — C61 Malignant neoplasm of prostate: Secondary | ICD-10-CM | POA: Diagnosis present

## 2024-04-16 DIAGNOSIS — D62 Acute posthemorrhagic anemia: Secondary | ICD-10-CM

## 2024-04-16 DIAGNOSIS — M25551 Pain in right hip: Secondary | ICD-10-CM | POA: Insufficient documentation

## 2024-04-16 DIAGNOSIS — K683 Retroperitoneal hematoma: Secondary | ICD-10-CM | POA: Diagnosis present

## 2024-04-16 DIAGNOSIS — L89312 Pressure ulcer of right buttock, stage 2: Secondary | ICD-10-CM | POA: Diagnosis present

## 2024-04-16 DIAGNOSIS — E538 Deficiency of other specified B group vitamins: Secondary | ICD-10-CM | POA: Diagnosis present

## 2024-04-16 DIAGNOSIS — D539 Nutritional anemia, unspecified: Secondary | ICD-10-CM | POA: Diagnosis present

## 2024-04-16 DIAGNOSIS — S72009A Fracture of unspecified part of neck of unspecified femur, initial encounter for closed fracture: Secondary | ICD-10-CM | POA: Diagnosis not present

## 2024-04-16 DIAGNOSIS — Z5986 Financial insecurity: Secondary | ICD-10-CM

## 2024-04-16 DIAGNOSIS — S91301A Unspecified open wound, right foot, initial encounter: Secondary | ICD-10-CM | POA: Insufficient documentation

## 2024-04-16 DIAGNOSIS — W19XXXA Unspecified fall, initial encounter: Secondary | ICD-10-CM

## 2024-04-16 DIAGNOSIS — S329XXA Fracture of unspecified parts of lumbosacral spine and pelvis, initial encounter for closed fracture: Principal | ICD-10-CM

## 2024-04-16 DIAGNOSIS — E1151 Type 2 diabetes mellitus with diabetic peripheral angiopathy without gangrene: Secondary | ICD-10-CM | POA: Diagnosis present

## 2024-04-16 DIAGNOSIS — T148XXA Other injury of unspecified body region, initial encounter: Secondary | ICD-10-CM | POA: Insufficient documentation

## 2024-04-16 DIAGNOSIS — L89322 Pressure ulcer of left buttock, stage 2: Secondary | ICD-10-CM | POA: Diagnosis present

## 2024-04-16 DIAGNOSIS — Z7984 Long term (current) use of oral hypoglycemic drugs: Secondary | ICD-10-CM | POA: Insufficient documentation

## 2024-04-16 DIAGNOSIS — Z79899 Other long term (current) drug therapy: Secondary | ICD-10-CM

## 2024-04-16 DIAGNOSIS — D6869 Other thrombophilia: Secondary | ICD-10-CM | POA: Diagnosis present

## 2024-04-16 DIAGNOSIS — M84550A Pathological fracture in neoplastic disease, pelvis, initial encounter for fracture: Secondary | ICD-10-CM | POA: Diagnosis not present

## 2024-04-16 DIAGNOSIS — Z7982 Long term (current) use of aspirin: Secondary | ICD-10-CM | POA: Insufficient documentation

## 2024-04-16 DIAGNOSIS — C7951 Secondary malignant neoplasm of bone: Secondary | ICD-10-CM | POA: Diagnosis present

## 2024-04-16 DIAGNOSIS — Z6841 Body Mass Index (BMI) 40.0 and over, adult: Secondary | ICD-10-CM

## 2024-04-16 DIAGNOSIS — K5900 Constipation, unspecified: Secondary | ICD-10-CM | POA: Diagnosis present

## 2024-04-16 DIAGNOSIS — E669 Obesity, unspecified: Secondary | ICD-10-CM | POA: Diagnosis present

## 2024-04-16 DIAGNOSIS — I739 Peripheral vascular disease, unspecified: Secondary | ICD-10-CM | POA: Diagnosis present

## 2024-04-16 DIAGNOSIS — Z794 Long term (current) use of insulin: Secondary | ICD-10-CM

## 2024-04-16 DIAGNOSIS — E1165 Type 2 diabetes mellitus with hyperglycemia: Secondary | ICD-10-CM | POA: Insufficient documentation

## 2024-04-16 DIAGNOSIS — E66813 Obesity, class 3: Secondary | ICD-10-CM | POA: Diagnosis present

## 2024-04-16 DIAGNOSIS — Z833 Family history of diabetes mellitus: Secondary | ICD-10-CM

## 2024-04-16 DIAGNOSIS — D509 Iron deficiency anemia, unspecified: Secondary | ICD-10-CM | POA: Diagnosis present

## 2024-04-16 DIAGNOSIS — I10 Essential (primary) hypertension: Secondary | ICD-10-CM | POA: Diagnosis present

## 2024-04-16 DIAGNOSIS — Z86718 Personal history of other venous thrombosis and embolism: Secondary | ICD-10-CM

## 2024-04-16 DIAGNOSIS — Z87891 Personal history of nicotine dependence: Secondary | ICD-10-CM

## 2024-04-16 DIAGNOSIS — E11621 Type 2 diabetes mellitus with foot ulcer: Secondary | ICD-10-CM | POA: Diagnosis present

## 2024-04-16 DIAGNOSIS — L97519 Non-pressure chronic ulcer of other part of right foot with unspecified severity: Secondary | ICD-10-CM | POA: Diagnosis present

## 2024-04-16 DIAGNOSIS — M8440XA Pathological fracture, unspecified site, initial encounter for fracture: Secondary | ICD-10-CM

## 2024-04-16 DIAGNOSIS — E1122 Type 2 diabetes mellitus with diabetic chronic kidney disease: Secondary | ICD-10-CM | POA: Diagnosis present

## 2024-04-16 DIAGNOSIS — E785 Hyperlipidemia, unspecified: Secondary | ICD-10-CM | POA: Diagnosis present

## 2024-04-16 DIAGNOSIS — N1831 Chronic kidney disease, stage 3a: Secondary | ICD-10-CM | POA: Diagnosis present

## 2024-04-16 DIAGNOSIS — Z23 Encounter for immunization: Secondary | ICD-10-CM

## 2024-04-16 DIAGNOSIS — N400 Enlarged prostate without lower urinary tract symptoms: Secondary | ICD-10-CM | POA: Diagnosis present

## 2024-04-16 LAB — CBC WITH DIFFERENTIAL/PLATELET
Abs Immature Granulocytes: 0.03 K/uL (ref 0.00–0.07)
Abs Immature Granulocytes: 0.03 K/uL (ref 0.00–0.07)
Basophils Absolute: 0 K/uL (ref 0.0–0.1)
Basophils Absolute: 0 K/uL (ref 0.0–0.1)
Basophils Relative: 0 %
Basophils Relative: 0 %
Eosinophils Absolute: 0 K/uL (ref 0.0–0.5)
Eosinophils Absolute: 0 K/uL (ref 0.0–0.5)
Eosinophils Relative: 1 %
Eosinophils Relative: 0 %
HCT: 31.1 % — ABNORMAL LOW (ref 39.0–52.0)
HCT: 27.2 % — ABNORMAL LOW (ref 39.0–52.0)
Hemoglobin: 9.9 g/dL — ABNORMAL LOW (ref 13.0–17.0)
Hemoglobin: 8.9 g/dL — ABNORMAL LOW (ref 13.0–17.0)
Immature Granulocytes: 1 %
Immature Granulocytes: 1 %
Lymphocytes Relative: 18 %
Lymphocytes Relative: 13 %
Lymphs Abs: 1 K/uL (ref 0.7–4.0)
Lymphs Abs: 0.8 K/uL (ref 0.7–4.0)
MCH: 28.6 pg (ref 26.0–34.0)
MCH: 29.5 pg (ref 26.0–34.0)
MCHC: 31.8 g/dL (ref 30.0–36.0)
MCHC: 32.7 g/dL (ref 30.0–36.0)
MCV: 89.9 fL (ref 80.0–100.0)
MCV: 90.1 fL (ref 80.0–100.0)
Monocytes Absolute: 0.4 K/uL (ref 0.1–1.0)
Monocytes Absolute: 0.6 K/uL (ref 0.1–1.0)
Monocytes Relative: 8 %
Monocytes Relative: 10 %
Neutro Abs: 4.2 K/uL (ref 1.7–7.7)
Neutro Abs: 4.5 K/uL (ref 1.7–7.7)
Neutrophils Relative %: 72 %
Neutrophils Relative %: 76 %
Platelets: 276 K/uL (ref 150–400)
Platelets: 225 K/uL (ref 150–400)
RBC: 3.46 MIL/uL — ABNORMAL LOW (ref 4.22–5.81)
RBC: 3.02 MIL/uL — ABNORMAL LOW (ref 4.22–5.81)
RDW: 15.1 % (ref 11.5–15.5)
RDW: 15.1 % (ref 11.5–15.5)
WBC: 5.8 K/uL (ref 4.0–10.5)
WBC: 6 K/uL (ref 4.0–10.5)
nRBC: 0 % (ref 0.0–0.2)
nRBC: 0 % (ref 0.0–0.2)

## 2024-04-16 LAB — BASIC METABOLIC PANEL WITH GFR
Anion gap: 8 (ref 5–15)
BUN: 23 mg/dL (ref 8–23)
CO2: 24 mmol/L (ref 22–32)
Calcium: 8.9 mg/dL (ref 8.9–10.3)
Chloride: 105 mmol/L (ref 98–111)
Creatinine, Ser: 0.92 mg/dL (ref 0.61–1.24)
GFR, Estimated: >60 mL/min (ref >60)
Glucose, Bld: 219 mg/dL — ABNORMAL HIGH (ref 70–99)
Potassium: 4.2 mmol/L (ref 3.5–5.1)
Sodium: 137 mmol/L (ref 135–145)

## 2024-04-16 LAB — COMPREHENSIVE METABOLIC PANEL WITH GFR
ALT: 13 U/L (ref 0–44)
AST: 23 U/L (ref 15–41)
Albumin: 3.5 g/dL (ref 3.5–5.0)
Alkaline Phosphatase: 85 U/L (ref 38–126)
Anion gap: 11 (ref 5–15)
BUN: 22 mg/dL (ref 8–23)
CO2: 26 mmol/L (ref 22–32)
Calcium: 9.4 mg/dL (ref 8.9–10.3)
Chloride: 105 mmol/L (ref 98–111)
Creatinine, Ser: 0.97 mg/dL (ref 0.61–1.24)
GFR, Estimated: >60 mL/min (ref >60)
Glucose, Bld: 229 mg/dL — ABNORMAL HIGH (ref 70–99)
Potassium: 4.3 mmol/L (ref 3.5–5.1)
Sodium: 142 mmol/L (ref 135–145)
Total Bilirubin: 0.7 mg/dL (ref 0.0–1.2)
Total Protein: 7.8 g/dL (ref 6.5–8.1)

## 2024-04-16 LAB — CBG MONITORING, ED: Glucose-Capillary: 233 mg/dL — ABNORMAL HIGH (ref 70–99)

## 2024-04-16 MED ORDER — ATORVASTATIN CALCIUM 20 MG PO TABS
10.0000 mg | ORAL_TABLET | Freq: Every evening | ORAL | Status: DC
  Administered 2024-04-17 – 2024-04-22 (×6): 10 mg via ORAL
  Filled 2024-04-16 (×6): qty 1

## 2024-04-16 MED ORDER — HYDROCODONE-ACETAMINOPHEN 5-325 MG PO TABS
2.0000 | ORAL_TABLET | Freq: Once | ORAL | Status: AC
  Administered 2024-04-16: 2 via ORAL
  Filled 2024-04-16: qty 2

## 2024-04-16 MED ORDER — FERROUS SULFATE 325 (65 FE) MG PO TABS
325.0000 mg | ORAL_TABLET | Freq: Two times a day (BID) | ORAL | Status: DC
  Administered 2024-04-17 – 2024-04-23 (×12): 325 mg via ORAL
  Filled 2024-04-16 (×12): qty 1

## 2024-04-16 MED ORDER — HYDROCODONE-ACETAMINOPHEN 5-325 MG PO TABS: 1.0000 | ORAL_TABLET | Freq: Four times a day (QID) | ORAL | 0 refills | Status: DC | PRN

## 2024-04-16 MED ORDER — OXYCODONE-ACETAMINOPHEN 5-325 MG PO TABS
1.0000 | ORAL_TABLET | Freq: Once | ORAL | Status: AC
  Administered 2024-04-16: 1 via ORAL
  Filled 2024-04-16: qty 1

## 2024-04-16 MED ORDER — FENTANYL CITRATE PF 50 MCG/ML IJ SOSY
50.0000 ug | PREFILLED_SYRINGE | Freq: Once | INTRAMUSCULAR | Status: DC
  Filled 2024-04-16: qty 1

## 2024-04-16 MED ORDER — LISINOPRIL 5 MG PO TABS: 5.0000 mg | ORAL_TABLET | Freq: Every day | ORAL | Status: DC

## 2024-04-16 MED ORDER — INSULIN GLARGINE-YFGN 100 UNIT/ML ~~LOC~~ SOLN
10.0000 [IU] | Freq: Every day | SUBCUTANEOUS | Status: DC
  Administered 2024-04-17 – 2024-04-18 (×3): 10 [IU] via SUBCUTANEOUS
  Filled 2024-04-16 (×3): qty 0.1

## 2024-04-16 MED ORDER — ASPIRIN 81 MG PO TBEC: 81.0000 mg | DELAYED_RELEASE_TABLET | Freq: Every day | ORAL | Status: DC

## 2024-04-16 NOTE — ED Triage Notes (Signed)
 Fall yesterday around 2pm at grocery store. Reports his right leg just gave out. Worsening right hip pain since fall. Non ambulatory since getting home from store.   Since fall has to be assisted to transfer   Current wound on bottom of right foot that he is seeing [facility] for. Last appointment was 03/11/24  History diabetes  EMS vitals: CBG 220 108HR 94% RA 133/78 b/p

## 2024-04-16 NOTE — ED Provider Notes (Signed)
 [facility] Provider Note    Event Date/Time   First MD Initiated Contact with Patient 04/16/24 2105     (approximate)   History   Groin Pain   HPI  [name] is a 73 y.o. 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  Patient ambulates with a walker at baseline.  He fell yesterday after losing his balance at the grocery store and landed on his right hip.  He was subsequently able to ambulate.  They became more difficult this morning.  He presented to our facility and was evaluated with an x-ray of the hip which demonstrated no acute abnormality.  He was subsequently discharged but patient states that he has been unable to ambulate since or bear weight on that hip.  Patient has a known ulceration on the sole of his foot.  He reports that this is followed by podiatry.  He denies any abdominal pain chest pain headache neck pain or pain anywhere else      Physical Exam   Triage Vital Signs: ED Triage Vitals  Encounter Vitals Group     BP 04/16/24 1912 136/61     Girls Systolic BP Percentile --      Girls Diastolic BP Percentile --      Boys Systolic BP Percentile --      Boys Diastolic BP Percentile --      Pulse Rate 04/16/24 1915 (!) 110     Resp 04/16/24 1912 17     Temp 04/16/24 1912 98.2 F (36.8 C)     Temp src --      SpO2 04/16/24 1912 94 %     Weight 04/16/24 1914 285 lb (129.3 kg)     Height 04/16/24 1914 5' 11 (1.803 m)     Head Circumference --      Peak Flow --      Pain Score 04/16/24 1914 7     Pain Loc --      Pain Education --      Exclude from Growth Chart --     Most recent  vital signs: Vitals:   04/16/24 1915 04/16/24 2145  BP:  115/60  Pulse: (!) 110 96  Resp:  16  Temp:    SpO2:  91%    Nursing Triage Note reviewed. Vital signs reviewed and patients oxygen saturation is normoxic  General: Patient is well nourished, well developed, awake and alert, resting comfortably in no acute distress Head: Normocephalic and atraumatic Eyes: Normal inspection, extraocular muscles intact, no conjunctival pallor Ear, nose, throat: Normal external exam Neck: Normal range of motion Respiratory: Patient is in no respiratory distress, lungs CTAB Cardiovascular: Patient is not tachycardic, RRR without murmur appreciated GI: Abd SNT with no guarding or rebound  Back: Normal inspection of the back with good strength and range of motion throughout all ext No T or L-spine tenderness to palpation Patient is tender to palpation over his right hip and pelvis but I do not see any skin findings Extremities: pulses intact with good cap refills, no LE pitting edema or calf tenderness Patient does have an ulceration on the sole of his right foot however this appears clean dry and intact without infection  Neuro: The patient is alert and oriented to person, place, and time, appropriately conversive, with 5/5 bilat UE/LE strength, no gross motor or sensory defects noted. Coordination appears to be adequate. Skin: Warm, dry, and intact Psych: normal mood and affect, no SI or HI  ED Results / Procedures / Treatments   Labs (all labs ordered are listed, but only abnormal results are displayed) Labs Reviewed  CBG MONITORING, ED - Abnormal; Notable for the following components:      Result Value   Glucose-Capillary 233 (*)    All other components within normal limits  CBC WITH DIFFERENTIAL/PLATELET  BASIC METABOLIC PANEL WITH GFR     EKG None  RADIOLOGY CT pelvis: Community and displaced fracture of the right iliac wing possible pathologic on my independent review  interpretation radiologist agrees There is a very small retroperitoneal hematoma likely reactive from the iliac wing fracture CT femur: No femur fracture   PROCEDURES:  Critical Care performed: No  Procedures   MEDICATIONS ORDERED IN ED: Medications  oxyCODONE -acetaminophen  (PERCOCET/ROXICET) 5-325 MG per tablet 1 tablet (1 tablet Oral Given 04/16/24 2142)     IMPRESSION / MDM / ASSESSMENT AND PLAN / ED COURSE                                Differential diagnosis includes, but is not limited to, pelvic fracture, femur fracture, contusion  ED course: Patient's right lower extremity is neurovascularly intact.  There is no skin findings.  However patient continues to be unable to bear weight.  Given this I opted for CT noncontrast which did demonstrate a comminuted displaced iliac wing fracture.  There was a small retroperitoneal hematoma which the radiologist thinks is secondary from this pelvic fracture and given patient's stability as this fracture is now more than 73 years old I do feel that he is safe to admit at this facility as opposed to any sort of trauma center given that this is his only issue.  Patient is aware that this may be considered a pathological fracture although we do not have a source yet.  I have sent an epic chat message to the orthopedics on-call but I do not feel that he needs to be waking up for this and case discussed with the hospitalist for admission   Clinical Course as of 04/16/24 2305  Sat Apr 16, 2024  2239 Glucose-Capillary(!): 233 [HD]  2249 CT PELVIS WO CONTRAST Demonstrates a comminuted fracture of the iliac crest will talk with orthopedics [HD]  2259 Epic message sent to Dr. Kayla Pinal to hopefully see in the AM, will discuss admission with hospitalist.  [HD]    Clinical Course User Index [HD] Nicholaus Rolland BRAVO, MD     FINAL CLINICAL IMPRESSION(S) / ED DIAGNOSES   Final diagnoses:  Closed displaced fracture of pelvis, unspecified part of  pelvis, initial encounter [facility])  Fall from standing, initial encounter     Rx / DC Orders   ED Discharge Orders     None        Note:  This document was prepared using Dragon voice recognition software and may include unintentional dictation errors.   Nicholaus Rolland BRAVO, MD 04/16/24 2325

## 2024-04-16 NOTE — ED Provider Notes (Signed)
 [place] EMERGENCY DEPARTMENT AT [facility] Provider Note   CSN: [id number] Arrival date & time: 04/16/24  1126     Patient presents with: Jerry   SEMAJ Horn is a 73 y.o. male.   Patient here for fall with hip injury.  Yesterday fell the grocery store as he lost balance did not hit head or lose consciousness at any point.  No chest pain or shortness of breath.  Did sustain injury to right hip now worse with ambulation.  Seen [facility] orthopedics for a right foot.  No numbness or weakness.   Fall Pertinent negatives include no chest pain, no abdominal pain and no shortness of breath.       Prior to Admission medications   Medication Sig Start Date End Date Taking? Authorizing Provider  HYDROcodone -acetaminophen  (NORCO/VICODIN) 5-325 MG tablet Take 1 tablet by mouth every 6 (six) hours as needed for up to 5 days for moderate pain (pain score 4-6). 04/16/24 04/21/24 Yes Kingston Mallick, PA-C  ACCU-CHEK GUIDE test strip Use 1 strip via meter four times a day  to monitor blood glucose 07/10/20   [provider]  Accu-Chek Softclix Lancets lancets  04/29/20   [provider]  acetaminophen  (TYLENOL ) 325 MG tablet Take 650 mg by mouth every 6 (six) hours as needed.    [provider]  aspirin  EC 81 MG tablet Take 81 mg by mouth daily. Swallow whole.    [provider]  atorvastatin  (LIPITOR) 10 MG tablet Take 10 mg by mouth every evening. 06/12/20   [provider]  DROPLET INSULIN  SYRINGE 31G X 5/16 0.3 ML MISC USE FIVE TIMES DAILY AS DIRECTED 06/12/20   [provider]  FEROSUL 325 (65 Fe) MG tablet Take 325 mg by mouth 2 (two) times daily with a meal. 12/26/21   [provider]  glucose blood (ACCU-CHEK AVIVA PLUS) test strip  04/29/20   [provider]  insulin  glargine (LANTUS ) 100 UNIT/ML injection Inject 0.15 mLs (15 Units total) into the skin at bedtime. 10/13/16   Sherial Bail, MD  insulin   lispro (HUMALOG) 100 UNIT/ML injection Inject 4 Units into the skin See admin instructions. Inject 4 units SQ with breakfast, inject 4 units SQ with lunch and inject 4 units SQ with dinner    [provider]  Insulin  Syringe-Needle U-100 31G X 5/16 0.3 ML MISC  06/05/20   [provider]  lisinopril  (ZESTRIL ) 5 MG tablet Take 1 tablet (5 mg total) by mouth daily. 01/08/22   Patel, Sona, MD  metFORMIN (GLUCOPHAGE) 1000 MG tablet Take 1,000 mg by mouth 2 (two) times daily with a meal. 06/12/20   [provider]  Multiple Vitamins-Minerals (CENTROVITE) TABS Take 1 tablet by mouth daily. Patient not taking: Reported on 07/03/2023    [provider]  polyethylene glycol (MIRALAX  / GLYCOLAX ) packet Take 17 g by mouth daily.    [provider]  senna (SENOKOT) 8.6 MG TABS tablet Take 1 tablet by mouth 2 (two) times daily. Patient not taking: Reported on 07/03/2023    [provider]  Simethicone  (GAS-X ULTRA STRENGTH PO) Take 1 capsule by mouth 4 (four) times daily as needed (for flatulence).    [provider]    Allergies: Patient has no known allergies.    Review of Systems  Constitutional:  Negative for chills and fever.  HENT:  Negative for congestion.   Respiratory:  Negative for cough and shortness of breath.   Cardiovascular:  Negative for chest pain.  Gastrointestinal:  Negative for abdominal pain.  Genitourinary:  Negative for dysuria.  Musculoskeletal:  Positive for arthralgias.  Skin:  Negative for wound.  Neurological:  Negative for weakness and numbness.    Updated Vital Signs BP (!) 144/69 (BP Location: Left Arm)   Pulse (!) 104   Temp 97.9 F (36.6 C) (Oral)   Resp 16   Ht 5' 11 (1.803 m)   Wt 129.3 kg   SpO2 94%   BMI 39.75 kg/m   Physical Exam Vitals reviewed.  Constitutional:      Appearance: Normal appearance.  HENT:     Head: Normocephalic and atraumatic.     Nose: Nose normal.  Cardiovascular:      Pulses: Normal pulses.  Pulmonary:     Effort: Pulmonary effort is normal.  Musculoskeletal:     Cervical back: Normal range of motion.     Comments: Right leg with tenderness decreased range of motion mild external rotation.  Right foot with walking boot and gauze in place.  Chronic venous stasis changes lower extremity.  Neurological:     Mental Status: He is alert and oriented to person, place, and time. Mental status is at baseline.  Psychiatric:        Mood and Affect: Mood normal.        Behavior: Behavior normal.     (all labs ordered are listed, but only abnormal results are displayed) Labs Reviewed  CBC WITH DIFFERENTIAL/PLATELET - Abnormal; Notable for the following components:      Result Value   RBC 3.46 (*)    Hemoglobin 9.9 (*)    HCT 31.1 (*)    All other components within normal limits  COMPREHENSIVE METABOLIC PANEL WITH GFR - Abnormal; Notable for the following components:   Glucose, Bld 229 (*)    All other components within normal limits    EKG: None  Radiology: DG Hip Unilat  With Pelvis 2-3 Views Right Result Date: 04/16/2024 EXAM: 2 or 3 VIEW(S) XRAY OF THE PELVIS AND RIGHT HIP 04/16/2024 01:33:00 PM COMPARISON: None available. CLINICAL HISTORY: Fall yesterday around 2pm at grocery store. Reports his right leg just gave out. Worsening right hip pain since fall. Non ambulatory since getting home from store. FINDINGS: JOINTS: SI joints are symmetric. No acute fracture. Bilateral hips demonstrate normal alignment. Mild bilateral hip osteoarthritis. SOFT TISSUES: The soft tissues are unremarkable. DISCS/DEGENERATIVE CHANGES: Lumbar degenerative disc disease. IMPRESSION: 1. No acute abnormality of the right hip. 2. Mild bilateral hip osteoarthritis. Electronically signed by: Waddell Calk MD 04/16/2024 01:47 PM EDT RP Workstation: [id number]     Procedures   Medications Ordered in the ED  HYDROcodone -acetaminophen  (NORCO/VICODIN) 5-325 MG per tablet 2 tablet  (2 tablets Oral Given 04/16/24 1414)                                    Medical Decision Making Patient here for fall had a mechanical fall yesterday with hip injury.  No head injury no loss of consciousness.  I am concerned for hip fracture.  No evidence of head injury or syncope.  X-ray is reassuring.  No acute fracture.  Labs overall unremarkable chronic anemia mild hyperglycemia.  Will provide pain medication follow-up with Ortho.  Amount and/or Complexity of Data Reviewed Labs: ordered. Radiology: ordered.  Risk Prescription drug management.        Final diagnoses:  Fall, initial  encounter  Right hip pain    ED Discharge Orders          Ordered    HYDROcodone -acetaminophen  (NORCO/VICODIN) 5-325 MG tablet  Every 6 hours PRN        04/16/24 1419               Kingston Mallick, PA-C 04/16/24 1422    Jacolyn Pae, MD 04/16/24 [phone]

## 2024-04-16 NOTE — ED Triage Notes (Signed)
 Fall yesterday afternoon, seen here this AM and d/c with no injuries. Pt states he is unable to walk due to right groin pain.

## 2024-04-17 DIAGNOSIS — M8440XA Pathological fracture, unspecified site, initial encounter for fracture: Secondary | ICD-10-CM

## 2024-04-17 DIAGNOSIS — D539 Nutritional anemia, unspecified: Secondary | ICD-10-CM | POA: Diagnosis present

## 2024-04-17 DIAGNOSIS — E66813 Obesity, class 3: Secondary | ICD-10-CM | POA: Diagnosis present

## 2024-04-17 DIAGNOSIS — Z23 Encounter for immunization: Secondary | ICD-10-CM | POA: Diagnosis present

## 2024-04-17 DIAGNOSIS — S72009A Fracture of unspecified part of neck of unspecified femur, initial encounter for closed fracture: Secondary | ICD-10-CM | POA: Diagnosis present

## 2024-04-17 DIAGNOSIS — L89322 Pressure ulcer of left buttock, stage 2: Secondary | ICD-10-CM | POA: Diagnosis present

## 2024-04-17 DIAGNOSIS — E1122 Type 2 diabetes mellitus with diabetic chronic kidney disease: Secondary | ICD-10-CM | POA: Diagnosis present

## 2024-04-17 DIAGNOSIS — Z7982 Long term (current) use of aspirin: Secondary | ICD-10-CM | POA: Diagnosis not present

## 2024-04-17 DIAGNOSIS — T148XXA Other injury of unspecified body region, initial encounter: Secondary | ICD-10-CM | POA: Insufficient documentation

## 2024-04-17 DIAGNOSIS — Z6841 Body Mass Index (BMI) 40.0 and over, adult: Secondary | ICD-10-CM | POA: Diagnosis not present

## 2024-04-17 DIAGNOSIS — E1165 Type 2 diabetes mellitus with hyperglycemia: Secondary | ICD-10-CM | POA: Diagnosis present

## 2024-04-17 DIAGNOSIS — S91301A Unspecified open wound, right foot, initial encounter: Secondary | ICD-10-CM | POA: Insufficient documentation

## 2024-04-17 DIAGNOSIS — L97519 Non-pressure chronic ulcer of other part of right foot with unspecified severity: Secondary | ICD-10-CM | POA: Diagnosis present

## 2024-04-17 DIAGNOSIS — E11621 Type 2 diabetes mellitus with foot ulcer: Secondary | ICD-10-CM | POA: Diagnosis present

## 2024-04-17 DIAGNOSIS — N400 Enlarged prostate without lower urinary tract symptoms: Secondary | ICD-10-CM | POA: Diagnosis present

## 2024-04-17 DIAGNOSIS — C61 Malignant neoplasm of prostate: Secondary | ICD-10-CM | POA: Diagnosis present

## 2024-04-17 DIAGNOSIS — I129 Hypertensive chronic kidney disease with stage 1 through stage 4 chronic kidney disease, or unspecified chronic kidney disease: Secondary | ICD-10-CM | POA: Diagnosis present

## 2024-04-17 DIAGNOSIS — K683 Retroperitoneal hematoma: Secondary | ICD-10-CM | POA: Diagnosis present

## 2024-04-17 DIAGNOSIS — D62 Acute posthemorrhagic anemia: Secondary | ICD-10-CM

## 2024-04-17 DIAGNOSIS — D6869 Other thrombophilia: Secondary | ICD-10-CM

## 2024-04-17 DIAGNOSIS — Z79899 Other long term (current) drug therapy: Secondary | ICD-10-CM | POA: Diagnosis not present

## 2024-04-17 DIAGNOSIS — E785 Hyperlipidemia, unspecified: Secondary | ICD-10-CM | POA: Diagnosis present

## 2024-04-17 DIAGNOSIS — N1831 Chronic kidney disease, stage 3a: Secondary | ICD-10-CM | POA: Diagnosis present

## 2024-04-17 DIAGNOSIS — L89312 Pressure ulcer of right buttock, stage 2: Secondary | ICD-10-CM | POA: Diagnosis present

## 2024-04-17 DIAGNOSIS — Z794 Long term (current) use of insulin: Secondary | ICD-10-CM | POA: Diagnosis not present

## 2024-04-17 DIAGNOSIS — C7951 Secondary malignant neoplasm of bone: Secondary | ICD-10-CM | POA: Diagnosis present

## 2024-04-17 DIAGNOSIS — Z7984 Long term (current) use of oral hypoglycemic drugs: Secondary | ICD-10-CM | POA: Diagnosis not present

## 2024-04-17 DIAGNOSIS — E1151 Type 2 diabetes mellitus with diabetic peripheral angiopathy without gangrene: Secondary | ICD-10-CM | POA: Diagnosis present

## 2024-04-17 DIAGNOSIS — M84550A Pathological fracture in neoplastic disease, pelvis, initial encounter for fracture: Secondary | ICD-10-CM | POA: Diagnosis present

## 2024-04-17 DIAGNOSIS — D509 Iron deficiency anemia, unspecified: Secondary | ICD-10-CM | POA: Diagnosis present

## 2024-04-17 DIAGNOSIS — E538 Deficiency of other specified B group vitamins: Secondary | ICD-10-CM | POA: Diagnosis not present

## 2024-04-17 LAB — CBC
HCT: 26.5 % — ABNORMAL LOW (ref 39.0–52.0)
Hemoglobin: 8.7 g/dL — ABNORMAL LOW (ref 13.0–17.0)
MCH: 29 pg (ref 26.0–34.0)
MCHC: 32.8 g/dL (ref 30.0–36.0)
MCV: 88.3 fL (ref 80.0–100.0)
Platelets: 243 K/uL (ref 150–400)
RBC: 3 MIL/uL — ABNORMAL LOW (ref 4.22–5.81)
RDW: 14.9 % (ref 11.5–15.5)
WBC: 5.4 K/uL (ref 4.0–10.5)
nRBC: 0 % (ref 0.0–0.2)

## 2024-04-17 LAB — GLUCOSE, CAPILLARY
Glucose-Capillary: 138 mg/dL — ABNORMAL HIGH (ref 70–99)
Glucose-Capillary: 141 mg/dL — ABNORMAL HIGH (ref 70–99)
Glucose-Capillary: 157 mg/dL — ABNORMAL HIGH (ref 70–99)
Glucose-Capillary: 163 mg/dL — ABNORMAL HIGH (ref 70–99)
Glucose-Capillary: 188 mg/dL — ABNORMAL HIGH (ref 70–99)
Glucose-Capillary: 292 mg/dL — ABNORMAL HIGH (ref 70–99)

## 2024-04-17 LAB — COMPREHENSIVE METABOLIC PANEL WITH GFR
ALT: 10 U/L (ref 0–44)
AST: 19 U/L (ref 15–41)
Albumin: 3.2 g/dL — ABNORMAL LOW (ref 3.5–5.0)
Alkaline Phosphatase: 72 U/L (ref 38–126)
Anion gap: 8 (ref 5–15)
BUN: 21 mg/dL (ref 8–23)
CO2: 25 mmol/L (ref 22–32)
Calcium: 8.9 mg/dL (ref 8.9–10.3)
Chloride: 106 mmol/L (ref 98–111)
Creatinine, Ser: 0.82 mg/dL (ref 0.61–1.24)
GFR, Estimated: >60 mL/min (ref >60)
Glucose, Bld: 185 mg/dL — ABNORMAL HIGH (ref 70–99)
Potassium: 3.8 mmol/L (ref 3.5–5.1)
Sodium: 139 mmol/L (ref 135–145)
Total Bilirubin: 0.9 mg/dL (ref 0.0–1.2)
Total Protein: 7.3 g/dL (ref 6.5–8.1)

## 2024-04-17 LAB — TSH: TSH: 1.573 u[IU]/mL (ref 0.350–4.500)

## 2024-04-17 LAB — PSA: Prostatic Specific Antigen: 1123.11 ng/mL — ABNORMAL HIGH (ref 0.00–4.00)

## 2024-04-17 LAB — CBG MONITORING, ED: Glucose-Capillary: 190 mg/dL — ABNORMAL HIGH (ref 70–99)

## 2024-04-17 LAB — APTT: aPTT: 35 s (ref 24–36)

## 2024-04-17 LAB — PROTIME-INR
INR: 1.3 — ABNORMAL HIGH (ref 0.8–1.2)
Prothrombin Time: 16.6 s — ABNORMAL HIGH (ref 11.4–15.2)

## 2024-04-17 MED ORDER — PNEUMOCOCCAL 20-VAL CONJ VACC 0.5 ML IM SUSY
0.5000 mL | PREFILLED_SYRINGE | INTRAMUSCULAR | Status: AC
  Administered 2024-04-20: 0.5 mL via INTRAMUSCULAR
  Filled 2024-04-17: qty 0.5

## 2024-04-17 MED ORDER — ACETAMINOPHEN 325 MG PO TABS: 650.0000 mg | ORAL_TABLET | Freq: Four times a day (QID) | ORAL | Status: DC | PRN

## 2024-04-17 MED ORDER — ACETAMINOPHEN 650 MG RE SUPP
650.0000 mg | Freq: Four times a day (QID) | RECTAL | Status: DC | PRN
Start: 2024-04-17 — End: 2024-04-23

## 2024-04-17 MED ORDER — POLYETHYLENE GLYCOL 3350 17 G PO PACK
17.0000 g | PACK | Freq: Every day | ORAL | Status: DC | PRN
  Administered 2024-04-19 – 2024-04-22 (×3): 17 g via ORAL
  Filled 2024-04-17 (×3): qty 1

## 2024-04-17 MED ORDER — INSULIN ASPART 100 UNIT/ML IJ SOLN
0.0000 [IU] | INTRAMUSCULAR | Status: DC
  Administered 2024-04-17 (×2): 3 [IU] via SUBCUTANEOUS
  Administered 2024-04-17 (×2): 2 [IU] via SUBCUTANEOUS
  Administered 2024-04-17 – 2024-04-18 (×3): 3 [IU] via SUBCUTANEOUS
  Administered 2024-04-18: 8 [IU] via SUBCUTANEOUS
  Administered 2024-04-18: 3 [IU] via SUBCUTANEOUS
  Administered 2024-04-18: 5 [IU] via SUBCUTANEOUS
  Administered 2024-04-18: 3 [IU] via SUBCUTANEOUS
  Administered 2024-04-18: 2 [IU] via SUBCUTANEOUS
  Administered 2024-04-19: 3 [IU] via SUBCUTANEOUS
  Administered 2024-04-19 (×3): 5 [IU] via SUBCUTANEOUS
  Filled 2024-04-17 (×8): qty 1
  Filled 2024-04-17: qty 3
  Filled 2024-04-17 (×5): qty 1

## 2024-04-17 MED ORDER — DM-GUAIFENESIN ER 30-600 MG PO TB12
1.0000 | ORAL_TABLET | Freq: Two times a day (BID) | ORAL | Status: DC
  Administered 2024-04-17 – 2024-04-23 (×12): 1 via ORAL
  Filled 2024-04-17 (×13): qty 1

## 2024-04-17 MED ORDER — OXYCODONE HCL 5 MG PO TABS
5.0000 mg | ORAL_TABLET | ORAL | Status: DC | PRN
  Administered 2024-04-17 – 2024-04-23 (×17): 5 mg via ORAL
  Filled 2024-04-17 (×17): qty 1

## 2024-04-17 NOTE — Plan of Care (Signed)

## 2024-04-17 NOTE — Assessment & Plan Note (Signed)
 Right foot chronic wound Continue regular cleaning and bandage as per outpatient note 03/10/2024 Does not appear to be contributing to his current presentation

## 2024-04-17 NOTE — Assessment & Plan Note (Signed)
 Peripheral arterial disease Continue the patient's home aspirin  Continue home atorvastatin  10 mg Currently no rest pain

## 2024-04-17 NOTE — Assessment & Plan Note (Signed)
 Secondary hypercoagulable state Secondary to immobility from hip fracture currently holding off of pharmacological DVT prophylaxis given hemoglobin went from 9.9 down to 8.9 from 11 AM this morning remains hemodynamically stable if Hb stable in the a.m. starting subcu heparin , suspect due to the patient's hematoma, SCDs for now

## 2024-04-17 NOTE — Assessment & Plan Note (Signed)
 Hypertension Continue lisinopril  5 mg daily

## 2024-04-17 NOTE — Progress Notes (Addendum)
 Interim progress note not for billing.  I have seen and examined the patient, reviewed the chart, and agree with assessment and plan as stipulated.  Since admission psa has returned markedly elevated, thus suspect prostate cancer though if this is cancer not clear if lucency seen on CT represents met; alk phos is not elevated. Advised patient he will need outpatient urology f/u.  Currently npo pending orthopedics eval, will order pt/ot consults when ortho gives the go ahead.  Agree with plan to monitor hematomas clinically and with serial labs, though will hold home aspirin .

## 2024-04-17 NOTE — Assessment & Plan Note (Signed)
 Hematoma Hemoglobin from 9.9 down to 8.9 CT: Considerable soft tissue stranding and contusion involving the right hip. Small volume right retroperitoneal and pelvic hematoma. No significant overlying skin changes or evidence of developing compartment syndrome Patient not on chronic anticoagulation Plan: Follow serial hemoglobin, rating coagulation studies, holding DVT prophylaxis for now until hemoglobin stabilizes

## 2024-04-17 NOTE — Consult Note (Signed)
 ORTHOPAEDIC CONSULTATION  REQUESTING PHYSICIAN: Kandis Devaughn Sayres, MD  Chief Complaint: Right hip pain  HPI: [name] is a 73 y.o. male who complains of right hip pain after a fall at a grocery store on Friday, 04/15/2024.  He presented to the [facility] emergency room yesterday morning and plain x-rays did not show any hip or lower pelvic fracture.  He was discharged home but a few hours later came back and further exam including CT scan of the pelvis revealed a fracture of the right superior iliac crest.  He was admitted for medical evaluation and rehabilitation.  There is a concern possible malignancy on the CT scan.  His PSA level is elevated.  Medical workup for malignancy will be undertaken.  Past Medical History:  Diagnosis Date   Arthritis    Bladder incontinence    Chronic kidney disease    Stage 3 Kidney disease   Diabetes mellitus without complication (HCC)    DVT of leg (deep venous thrombosis) (HCC)    H/O RIGHT LEG 2018   Dyspnea    occassional   Hyperlipidemia    Hypertension    Peripheral vascular disease (HCC)    Sepsis (HCC)    Past Surgical History:  Procedure Laterality Date   ABDOMINAL AORTOGRAM W/LOWER EXTREMITY N/A 10/10/2016   Procedure: Abdominal Aortogram w/Lower Extremity;  Surgeon: Cordella KANDICE Shawl, MD;  Location: [facility];  Service: Cardiovascular;  Laterality: N/A;   CATARACT EXTRACTION W/PHACO Left 05/27/2018   Procedure: CATARACT EXTRACTION PHACO AND INTRAOCULAR LENS PLACEMENT (IOC);  Surgeon: Myrna Adine Anes, MD;  Location: [facility];  Service: Ophthalmology;  Laterality: Left;  US  03:06.2 AP% 16.1 CDE 30.16 FLUID PACK LOT @ [id number] H   CATARACT EXTRACTION W/PHACO Right 07/13/2023   Procedure: CATARACT EXTRACTION PHACO AND INTRAOCULAR LENS PLACEMENT (IOC) RIGHT DIABETIC 4.00 00:36.6;  Surgeon: Myrna Adine Anes, MD;  Location: [facility];  Service: Ophthalmology;  Laterality: Right;   CIRCUMCISION  1998   COLONOSCOPY WITH  PROPOFOL  N/A 08/20/2023   Procedure: COLONOSCOPY WITH PROPOFOL ;  Surgeon: Unk Corinn Skiff, MD;  Location: [facility];  Service: Gastroenterology;  Laterality: N/A;   EYE SURGERY Left    Retina Detachment   GRAFT APPLICATION Right 05/29/2017   Procedure: GRAFT APPLICATION ( RIGHT FOOT );  Surgeon: Shawl Cordella KANDICE, MD;  Location: [facility];  Service: Vascular;  Laterality: Right;  graft taken from patients right thigh   IRRIGATION AND DEBRIDEMENT FOOT Right 10/03/2016   Procedure: IRRIGATION AND DEBRIDEMENT FOOT;  Surgeon: Eva Gay, DPM;  Location: [facility];  Service: Podiatry;  Laterality: Right;   LOWER EXTREMITY ANGIOGRAPHY Right 02/24/2023   Procedure: Lower Extremity Angiography;  Surgeon: Shawl Cordella KANDICE, MD;  Location: [facility];  Service: Cardiovascular;  Laterality: Right;   LOWER EXTREMITY INTERVENTION  10/10/2016   Procedure: Lower Extremity Intervention;  Surgeon: Cordella KANDICE Shawl, MD;  Location: [facility];  Service: Cardiovascular;;   PERIPHERAL VASCULAR BALLOON ANGIOPLASTY Left 10/10/2016   Procedure: Peripheral Vascular Balloon Angioplasty;  Surgeon: Cordella KANDICE Shawl, MD;  Location: [facility];  Service: Cardiovascular;  Laterality: Left;   TONSILLECTOMY     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/2022   Procedure: TRANSMETATARSAL AMPUTATION;  Surgeon: Gay Eva, DPM;  Location: [facility];  Service: Podiatry;  Laterality: Right;   WOUND DEBRIDEMENT Right 11/07/2016   Procedure: DEBRIDEMENT OF WOUND AND BONE RIGHT FOOT AND APPLY WOUND VAC;  Surgeon: Eva Gay, DPM;  Location: [facility];  Service:  Podiatry;  Laterality: Right;   Social History   Socioeconomic History   Marital status: Single    Spouse name: Not on file   Number of children: Not on file   Years of education: Not on file   Highest education level: Not on file  Occupational History   Not on file  Tobacco Use   Smoking status: Former    Types: Pipe, Cigarettes     Quit date: 05/23/1975    Years since quitting: 48.9   Smokeless tobacco: Never  Vaping Use   Vaping status: Never Used  Substance and Sexual Activity   Alcohol use: No   Drug use: No   Sexual activity: Not on file  Other Topics Concern   Not on file  Social History Narrative   Not on file   Social Drivers of Health   Financial Resource Strain: Low Risk  (08/28/2023)   Received from [facility]   Overall Financial Resource Strain (CARDIA)    Difficulty of Paying Living Expenses: Not hard at all  Recent Concern: Financial Resource Strain - Medium Risk (08/06/2023)   Received from [facility]   Overall Financial Resource Strain (CARDIA)    Difficulty of Paying Living Expenses: Somewhat hard  Food Insecurity: No Food Insecurity (04/17/2024)   Hunger Vital Sign    Worried About Running Out of Food in the Last Year: Never true    Ran Out of Food in the Last Year: Never true  Transportation Needs: No Transportation Needs (04/17/2024)   PRAPARE - Administrator, Civil Service (Medical): No    Lack of Transportation (Non-Medical): No  Physical Activity: Inactive (08/28/2023)   Received from [facility]   Exercise Vital Sign    On average, how many days per week do you engage in moderate to strenuous exercise (like a brisk walk)?: 0 days    On average, how many minutes do you engage in exercise at this level?: 0 min  Stress: No Stress Concern Present (08/28/2023)   Received from [facility] - Occupational Stress Questionnaire    Feeling of Stress : Not at all  Social Connections: Moderately Integrated (04/17/2024)   Social Connection and Isolation Panel    Frequency of Communication with Friends and Family: Three times a week    Frequency of Social Gatherings with Friends and Family: Three times a week    Attends Religious Services: 1 to 4 times per year     Active Member of Clubs or Organizations: Yes    Attends Banker Meetings: 1 to 4 times per year    Marital Status: Never married   Family History  Problem Relation Age of Onset   Diabetes Mother    Hypertension Mother    Diabetes Father    Hypertension Father    No Known Allergies Prior to Admission medications   Medication Sig Start Date End Date Taking? Authorizing Provider  ACCU-CHEK GUIDE test strip Use 1 strip via meter four times a day  to monitor blood glucose 07/10/20   [provider]  Accu-Chek Softclix Lancets lancets  04/29/20   [provider]  acetaminophen  (TYLENOL ) 325 MG tablet Take 650 mg by mouth every 6 (six) hours as needed.    [provider]  aspirin  EC 81 MG tablet Take 81 mg by mouth daily. Swallow whole.    [provider]  atorvastatin  (LIPITOR) 10  MG tablet Take 10 mg by mouth every evening. 06/12/20   [provider]  DROPLET INSULIN  SYRINGE 31G X 5/16 0.3 ML MISC USE FIVE TIMES DAILY AS DIRECTED 06/12/20   [provider]  FEROSUL 325 (65 Fe) MG tablet Take 325 mg by mouth 2 (two) times daily with a meal. 12/26/21   [provider]  glucose blood (ACCU-CHEK AVIVA PLUS) test strip  04/29/20   [provider]  HYDROcodone -acetaminophen  (NORCO/VICODIN) 5-325 MG tablet Take 1 tablet by mouth every 6 (six) hours as needed for up to 5 days for moderate pain (pain score 4-6). 04/16/24 04/21/24  Kingston Mallick, PA-C  insulin  glargine (LANTUS ) 100 UNIT/ML injection Inject 0.15 mLs (15 Units total) into the skin at bedtime. 10/13/16   Sherial Bail, MD  insulin  lispro (HUMALOG) 100 UNIT/ML injection Inject 4 Units into the skin See admin instructions. Inject 4 units SQ with breakfast, inject 4 units SQ with lunch and inject 4 units SQ with dinner    [provider]  Insulin  Syringe-Needle U-100 31G X 5/16 0.3 ML MISC  06/05/20   [provider]  lisinopril  (ZESTRIL ) 5 MG  tablet Take 1 tablet (5 mg total) by mouth daily. 01/08/22   Patel, Sona, MD  metFORMIN (GLUCOPHAGE) 1000 MG tablet Take 1,000 mg by mouth 2 (two) times daily with a meal. 06/12/20   [provider]  Multiple Vitamins-Minerals (CENTROVITE) TABS Take 1 tablet by mouth daily. Patient not taking: Reported on 07/03/2023    [provider]  polyethylene glycol (MIRALAX  / GLYCOLAX ) packet Take 17 g by mouth daily.    [provider]  senna (SENOKOT) 8.6 MG TABS tablet Take 1 tablet by mouth 2 (two) times daily. Patient not taking: Reported on 07/03/2023    [provider]  Simethicone  (GAS-X ULTRA STRENGTH PO) Take 1 capsule by mouth 4 (four) times daily as needed (for flatulence).    [provider]   CT FEMUR RIGHT WO CONTRAST Result Date: 04/16/2024 CLINICAL DATA:  Fall nonweightbearing EXAM: CT OF THE LOWER RIGHT EXTREMITY WITHOUT CONTRAST TECHNIQUE: Multidetector CT imaging of the right lower extremity was performed according to the standard protocol. RADIATION DOSE REDUCTION: This exam was performed according to the departmental dose-optimization program which includes automated exposure control, adjustment of the mA and/or kV according to patient size and/or use of iterative reconstruction technique. COMPARISON:  Radiograph 04/16/2024 FINDINGS: Bones/Joint/Cartilage Partially visualized acute comminuted and displaced right iliac wing fracture. Pubic symphysis and right rami appear intact. No fracture or malalignment involving the right femur. Moderate advanced arthritis at the right knee. No significant knee effusion. Ligaments Suboptimally assessed by CT. Muscles and Tendons No intramuscular fluid collections. Mild fatty atrophy of the hamstring muscles. Soft tissues Edema within the right gluteus and proximal hip muscles. Edema within the subcutaneous soft tissues lateral aspect of the right thigh and imaged portions of the proximal calf. Vascular  calcifications. Mildly enlarged right inguinal nodes measuring up to 12 mm. IMPRESSION: 1. Partially visualized acute comminuted and displaced right iliac wing fracture. 2. No acute osseous abnormality of the right femur. 3. Edema within the right gluteus and proximal hip muscles, with edema in the lateral thigh soft tissues. 4. Mildly enlarged right inguinal nodes, nonspecific Electronically Signed   By: Luke Bun M.D.   On: 04/16/2024 22:53   CT PELVIS WO CONTRAST Result Date: 04/16/2024 CLINICAL DATA:  Fall, unable to bear weight EXAM: CT PELVIS WITHOUT CONTRAST TECHNIQUE: Multidetector CT imaging of the  pelvis was performed following the standard protocol without intravenous contrast. RADIATION DOSE REDUCTION: This exam was performed according to the departmental dose-optimization program which includes automated exposure control, adjustment of the mA and/or kV according to patient size and/or use of iterative reconstruction technique. COMPARISON:  Radiograph 04/16/2024 FINDINGS: Urinary Tract:  Urinary bladder is unremarkable Bowel:  No acute bowel wall thickening. Vascular/Lymphatic: No aneurysm.  No suspicious lymph nodes. Reproductive:  Enlarged prostate. Other:  Negative for pelvic effusion or free air. Musculoskeletal: Acute comminuted fracture involving the right iliac wing with displaced iliac crest fragment, the fragment is displaced posteriorly and laterally by about 2 cm. There is possible lucent change within the underlying bone raising possibility of pathologic fracture. Pubic symphysis appears intact. No rami fracture. The SI joints are non widened. There is considerable soft tissue stranding and contusion involving the right hip. Small volume right retroperitoneal and pelvic hematoma. IMPRESSION: 1. Acute comminuted and displaced fracture involving the right iliac wing with displaced iliac crest fragment. There is possible lucent change within the underlying bone raising possibility of  pathologic fracture. 2. Considerable soft tissue stranding and contusion involving the right hip. Small volume right retroperitoneal and pelvic hematoma. Electronically Signed   By: Luke Bun M.D.   On: 04/16/2024 22:43   DG Hip Unilat  With Pelvis 2-3 Views Right Result Date: 04/16/2024 EXAM: 2 or 3 VIEW(S) XRAY OF THE PELVIS AND RIGHT HIP 04/16/2024 01:33:00 PM COMPARISON: None available. CLINICAL HISTORY: Fall yesterday around 2pm at grocery store. Reports his right leg just gave out. Worsening right hip pain since fall. Non ambulatory since getting home from store. FINDINGS: JOINTS: SI joints are symmetric. No acute fracture. Bilateral hips demonstrate normal alignment. Mild bilateral hip osteoarthritis. SOFT TISSUES: The soft tissues are unremarkable. DISCS/DEGENERATIVE CHANGES: Lumbar degenerative disc disease. IMPRESSION: 1. No acute abnormality of the right hip. 2. Mild bilateral hip osteoarthritis. Electronically signed by: Waddell Calk MD 04/16/2024 01:47 PM EDT RP Workstation: [id number]    Positive ROS: All other systems have been reviewed and were otherwise negative with the exception of those mentioned in the HPI and as above.  Physical Exam: General: Alert, no acute distress Cardiovascular: No pedal edema Respiratory: No cyanosis, no use of accessory musculature GI: No organomegaly, abdomen is soft and non-tender Skin: No lesions in the area of chief complaint Neurologic: Sensation intact distally Psychiatric: Patient is competent for consent with normal mood and affect Lymphatic: No axillary or cervical lymphadenopathy  MUSCULOSKELETAL: Patient is alert cooperative.  Grossly overweight.  Movement of the right hip is possible.  It produces some pain in the pelvis mostly.  He has significant bruising in the right flank and hip region from the knee iliac crest fracture.  He is tender in this area.  The left leg is unremarkable to exam.  The upper extremities are  normal.  Assessment: Displaced fracture right superior iliac crest with possible underlying malignancy  Plan: Conservative treatment for this.  Physical therapy as tolerated.  Workup for malignancy.  Radiology guided biopsy of the area can be done if felt to be necessary.  He may well need skilled nursing care.    Kayla FORBES Pinal, MD [phone]   04/17/2024 1:25 PM

## 2024-04-17 NOTE — H&P (Addendum)
 History and Physical    Patient: [name] FMW:[id number] DOB: May 24, 1951 DOA: 04/16/2024 DOS: the patient was seen and examined on 04/16/2024 PCP: Adina Buel HERO, MD  Patient coming from: Home  Chief Complaint:  Chief Complaint  Patient presents with   Groin Pain   HPI:  73 year old gentleman with a past medical history of hyperlipidemia, hypertension, iron  deficiency anemia, chronic venous insufficiency, type 2 diabetes mellitus, iron  deficiency anemia, peripheral arterial disease and DVT in 2018 not on chronic anticoagulation, chronic kidney disease stage IIIa who  presents to the emergency department after being discharged earlier today with unremarkable hip x-rays.  Namely the patient reports to me on Friday, 04/15/2024 he fell while at [employer] on his right hip and then had ambulatory dysfunction still had difficulties and right hip pain and once he was released from the hospital came back where subsequent CT revealed fracture.  He denies any malignancy history any urinary symptoms back pain and reports screening colonoscopies in the past.  Case discussed with the emergency department physician and patient subsequently admitted.  Past medical records reviewed and summarized: Patient's outpatient visit with podiatry on 03/10/2024 patient on chronic follow-up for right foot ulcer with the wound care directions for just bandage and regular foot washing with follow-up advised 05/11/2024 (2 months)  Review of Systems:  Constitutional: Denies Weight Loss, Fever, Chills or Night Sweats Eyes: Denies Blurry Vision, Eye Pain or Decreased Vision ENT: Denies Sore Throat , Tinnitus, Hearing Loss Respiratory: Denies Shortness of Breath, Cough, Hemoptysis, Wheezing, Pleurisy Cardiovascular: Denies Chest Pain, Paroxsymal Nocturnal Dyspnea, Palpitations, Edema Gastrointestinal: Denies Nausea, Vomiting, Diarrhea, Hematemesis, Melena Genitourinary: Denies hematuria, dysuria, hesitancy,  incontinence Musculoskeletal: Reports Joint Swelling Skin: Denies Rash, Pruritis, Sores, Nail Changes or Skin Thickening All other systems were reviewed and are negative   Past Medical History:  Diagnosis Date   Arthritis    Bladder incontinence    Chronic kidney disease    Stage 3 Kidney disease   Diabetes mellitus without complication (HCC)    DVT of leg (deep venous thrombosis) (HCC)    H/O RIGHT LEG 2018   Dyspnea    occassional   Hyperlipidemia    Hypertension    Peripheral vascular disease (HCC)    Sepsis (HCC)    Past Surgical History:  Procedure Laterality Date   ABDOMINAL AORTOGRAM W/LOWER EXTREMITY N/A 10/10/2016   Procedure: Abdominal Aortogram w/Lower Extremity;  Surgeon: Cordella KANDICE Shawl, MD;  Location: [facility];  Service: Cardiovascular;  Laterality: N/A;   CATARACT EXTRACTION W/PHACO Left 05/27/2018   Procedure: CATARACT EXTRACTION PHACO AND INTRAOCULAR LENS PLACEMENT (IOC);  Surgeon: Myrna Adine Anes, MD;  Location: [facility];  Service: Ophthalmology;  Laterality: Left;  US  03:06.2 AP% 16.1 CDE 30.16 FLUID PACK LOT @ [id number] H   CATARACT EXTRACTION W/PHACO Right 07/13/2023   Procedure: CATARACT EXTRACTION PHACO AND INTRAOCULAR LENS PLACEMENT (IOC) RIGHT DIABETIC 4.00 00:36.6;  Surgeon: Myrna Adine Anes, MD;  Location: [facility];  Service: Ophthalmology;  Laterality: Right;   CIRCUMCISION  1998   COLONOSCOPY WITH PROPOFOL  N/A 08/20/2023   Procedure: COLONOSCOPY WITH PROPOFOL ;  Surgeon: Unk Corinn Skiff, MD;  Location: [facility];  Service: Gastroenterology;  Laterality: N/A;   EYE SURGERY Left    Retina Detachment   GRAFT APPLICATION Right 05/29/2017   Procedure: GRAFT APPLICATION ( RIGHT FOOT );  Surgeon: Shawl Cordella KANDICE, MD;  Location: [facility];  Service: Vascular;  Laterality: Right;  graft taken from patients right thigh  IRRIGATION AND DEBRIDEMENT FOOT Right 10/03/2016   Procedure: IRRIGATION AND DEBRIDEMENT FOOT;  Surgeon:  Eva Gay, DPM;  Location: [facility];  Service: Podiatry;  Laterality: Right;   LOWER EXTREMITY ANGIOGRAPHY Right 02/24/2023   Procedure: Lower Extremity Angiography;  Surgeon: Jama Cordella MATSU, MD;  Location: [facility];  Service: Cardiovascular;  Laterality: Right;   LOWER EXTREMITY INTERVENTION  10/10/2016   Procedure: Lower Extremity Intervention;  Surgeon: Cordella MATSU Jama, MD;  Location: [facility];  Service: Cardiovascular;;   PERIPHERAL VASCULAR BALLOON ANGIOPLASTY Left 10/10/2016   Procedure: Peripheral Vascular Balloon Angioplasty;  Surgeon: Cordella MATSU Jama, MD;  Location: [facility];  Service: Cardiovascular;  Laterality: Left;   TONSILLECTOMY     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/2022   Procedure: TRANSMETATARSAL AMPUTATION;  Surgeon: Gay Eva, DPM;  Location: [facility];  Service: Podiatry;  Laterality: Right;   WOUND DEBRIDEMENT Right 11/07/2016   Procedure: DEBRIDEMENT OF WOUND AND BONE RIGHT FOOT AND APPLY WOUND VAC;  Surgeon: Eva Gay, DPM;  Location: [facility];  Service: Podiatry;  Laterality: Right;   Social History:  reports that he quit smoking about 48 years ago. His smoking use included pipe and cigarettes. He has never used smokeless tobacco. He reports that he does not drink alcohol and does not use drugs.  No Known Allergies  Family History  Problem Relation Age of Onset   Diabetes Mother    Hypertension Mother    Diabetes Father    Hypertension Father     Prior to Admission medications   Medication Sig Start Date End Date Taking? Authorizing Provider  ACCU-CHEK GUIDE test strip Use 1 strip via meter four times a day  to monitor blood glucose 07/10/20   [provider]  Accu-Chek Softclix Lancets lancets  04/29/20   [provider]  acetaminophen  (TYLENOL ) 325 MG tablet Take 650 mg by mouth every 6 (six) hours as needed.    [provider]  aspirin  EC 81 MG tablet Take 81 mg by mouth daily.  Swallow whole.    [provider]  atorvastatin  (LIPITOR) 10 MG tablet Take 10 mg by mouth every evening. 06/12/20   [provider]  DROPLET INSULIN  SYRINGE 31G X 5/16 0.3 ML MISC USE FIVE TIMES DAILY AS DIRECTED 06/12/20   [provider]  FEROSUL 325 (65 Fe) MG tablet Take 325 mg by mouth 2 (two) times daily with a meal. 12/26/21   [provider]  glucose blood (ACCU-CHEK AVIVA PLUS) test strip  04/29/20   [provider]  HYDROcodone -acetaminophen  (NORCO/VICODIN) 5-325 MG tablet Take 1 tablet by mouth every 6 (six) hours as needed for up to 5 days for moderate pain (pain score 4-6). 04/16/24 04/21/24  Kingston Mallick, PA-C  insulin  glargine (LANTUS ) 100 UNIT/ML injection Inject 0.15 mLs (15 Units total) into the skin at bedtime. 10/13/16   Sherial Bail, MD  insulin  lispro (HUMALOG) 100 UNIT/ML injection Inject 4 Units into the skin See admin instructions. Inject 4 units SQ with breakfast, inject 4 units SQ with lunch and inject 4 units SQ with dinner    [provider]  Insulin  Syringe-Needle U-100 31G X 5/16 0.3 ML MISC  06/05/20   [provider]  lisinopril  (ZESTRIL ) 5 MG tablet Take 1 tablet (5 mg total) by mouth daily. 01/08/22   Patel, Sona, MD  metFORMIN (GLUCOPHAGE) 1000 MG tablet Take 1,000 mg by mouth 2 (two) times daily with a meal. 06/12/20   [provider]  Multiple Vitamins-Minerals (CENTROVITE) TABS Take 1 tablet by mouth daily. Patient not taking: Reported on 07/03/2023    [provider]  polyethylene glycol (MIRALAX  / GLYCOLAX ) packet Take 17 g by mouth daily.    [provider]  senna (SENOKOT) 8.6 MG TABS tablet Take 1 tablet by mouth 2 (two) times daily. Patient not taking: Reported on 07/03/2023    [provider]  Simethicone  (GAS-X ULTRA STRENGTH PO) Take 1 capsule by mouth 4 (four) times daily as needed (for flatulence).    [provider]    Physical Exam: Vitals:    04/16/24 1914 04/16/24 1915 04/16/24 2145 04/16/24 2320  BP:   115/60 128/66  Pulse:  (!) 110 96   Resp:   16 16  Temp:    98.9 F (37.2 C)  TempSrc:    Oral  SpO2:   91% 96%  Weight: 129.3 kg     Height: 5' 11 (1.803 m)     Patient seen and examined approximately 11:35 PM on 04/16/2024 in hallway bed 5 of the emergency department Constitutional:  Vital Signs as per [facility] than three noted] No Acute Distress Eyes:  Pink Conjunctiva and no Ptosis ENMT:   External Appearance of Ears and Nose without obvious deformity, masses or scar             Examination of teeth lips and gums: Very Poor Dentition with multiple missing teeth Neck:     Trachea Midline, Neck Symmetric             Thyroid without tenderness, palpable masses or nodules Respiratory:   Respiratory Effort Normal: No Use of Respiratory Muscles,No  Intercostal Retractions             Lungs Clear to Auscultation Bilaterally Cardiovascular:   Heart Auscultated: Regular Regular without any added sounds or murmurs              Bilateral lower extremity stigmata of venous stasis with the right leg specifically with multiple toes missing and dressing clean and dry from his chronic wound care which shows no signs of secondary infection patient has no sensation bilateral lower extremities at baseline due to his neuropathy capillary fill time  3-4 seconds bilateral lower extremities slight swelling but no skin changes around the right hip region, RLE  slightly out-turned, swelling around right hip but no overlying skin changes , tender to touch  Gastrointestinal:  Abdomen soft and nontender without palpable masses, guarding or rebound  No Palpable Splenomegaly or Hepatomegaly Psychiatric:  Patient Orientated to Time, Place and Person Patient with appropriate mood and affect Recent and Remote Memory Intact  Data Reviewed: Labs, Radiology, ECG as detailed in HPI and A/P   Assessment and Plan: * Hip fracture (HCC) Hip  fracture CT:1. Partially visualized acute comminuted and displaced right iliac wing fracture.2. No acute osseous abnormality of the right femur. 3. Edema within the right gluteus and proximal hip muscles, with edema in the lateral thigh soft tissues. 4. Mildly enlarged right inguinal nodes, nonspecific 1. Acute comminuted and displaced fracture involving the right iliacwing with displaced iliac crest fragment. There is possible lucentchange within the underlying bone raising possibility of pathologic fracture. Plan: N.p.o. pending orthopedics consultation operative versus nonoperative management (secure chat sent to Dr. Cleotilde by ED also sent EMR order), order ECG for potential preoperative setting, depending on nature of management PT OT consultation if nonoperative as interval   Pathological fracture Concern for pathological source of fracture  Needs outpatient contrasted CT thorax abdomen and pelvis, ordering ionized calcium  PSA CEA and TSH as inpatient, will need outpatient serum protein electrophoresis as well   Secondary hypercoagulable state (HCC) Secondary hypercoagulable state Secondary to immobility from hip fracture currently holding off of pharmacological DVT prophylaxis given hemoglobin went from 9.9 down to 8.9 from 11 AM this morning remains hemodynamically stable if Hb stable in the a.m. starting subcu heparin , suspect due to the patient's hematoma, SCDs for now   Acute blood loss anemia Concern for acute blood loss anemia Secondary to hematoma, on chronic iron  supplementation at home due to chronic iron  deficiency anemia Hemoglobin 8.9 (from 9.9 earlier today) and hemodynamically stable Given patient does not have prolonged immobility would like to prioritize DVT prophylaxis however decreased from 9.9 earlier today if hemoglobin stable in the a.m. plan for DVT prophylax with heparin   Open wound of right foot Right foot chronic wound Continue regular cleaning and bandage as per  outpatient note 03/10/2024 Does not appear to be contributing to his current presentation  Hematoma Hematoma Hemoglobin from 9.9 down to 8.9 CT: Considerable soft tissue stranding and contusion involving the right hip. Small volume right retroperitoneal and pelvic hematoma. No significant overlying skin changes or evidence of developing compartment syndrome Patient not on chronic anticoagulation Plan: Follow serial hemoglobin, rating coagulation studies, holding DVT prophylaxis for now until hemoglobin stabilizes  HTN (hypertension) Hypertension lisinopril  5 mg daily holding  for now given softer pressures  PAD (peripheral artery disease) (HCC) Peripheral arterial disease Continue the patient's home aspirin  Continue home atorvastatin  10 mg Currently no rest pain  DM (diabetes mellitus), type 2 (HCC) Type 2 diabetes mellitus with hyperglycemia Glucose currently in the 220s Hold home metformin, continue home Lantus  15 units at decreased dose 10 units while n.p.o. and hold his home 4 units with meals and add sliding scale every 4 hours while n.p.o.      Advance Care Planning:   Code Status: Full Code   Consults: Orthopedic Surgery (EMR Order placed and secure chat sent)  Family Communication: No answer sister Nichole:  [phone] @12 :17 AM 8/17    Severity of Illness: The appropriate patient status for this patient is INPATIENT. Inpatient status is judged to be reasonable and necessary in order to provide the required intensity of service to ensure the patient's safety. The patient's presenting symptoms, physical exam findings, and initial radiographic and laboratory data in the context of their chronic comorbidities is felt to place them at high risk for further clinical deterioration. Furthermore, it is not anticipated that the patient will be medically stable for discharge from the hospital within 2 midnights of admission.   * I certify that at the point of admission it is my  clinical judgment that the patient will require inpatient hospital care spanning beyond 2 midnights from the point of admission due to high intensity of service, high risk for further deterioration and high frequency of surveillance required.*  Author: Prentice JAYSON Lowenstein, MD 04/17/2024 12:15 AM  For on call review [email or web address].

## 2024-04-17 NOTE — Assessment & Plan Note (Signed)
 Concern for acute blood loss anemia Secondary to hematoma, on chronic iron  supplementation at home due to chronic iron  deficiency anemia Hemoglobin 8.9 (from 9.9 earlier today) and hemodynamically stable Given patient does not have prolonged immobility would like to prioritize DVT prophylaxis however decreased from 9.9 earlier today if hemoglobin stable in the a.m. plan for DVT prophylax with heparin 

## 2024-04-17 NOTE — Assessment & Plan Note (Signed)
 Concern for pathological source of fracture Needs outpatient contrasted CT thorax abdomen and pelvis, ordering ionized calcium  PSA CEA and TSH as inpatient, will need outpatient serum protein electrophoresis as well

## 2024-04-17 NOTE — Assessment & Plan Note (Signed)
 Hip fracture CT:1. Partially visualized acute comminuted and displaced right iliac wing fracture.2. No acute osseous abnormality of the right femur. 3. Edema within the right gluteus and proximal hip muscles, with edema in the lateral thigh soft tissues. 4. Mildly enlarged right inguinal nodes, nonspecific 1. Acute comminuted and displaced fracture involving the right iliacwing with displaced iliac crest fragment. There is possible lucentchange within the underlying bone raising possibility of pathologic fracture. Plan: N.p.o. pending orthopedics consultation operative versus nonoperative management (secure chat sent to Dr. Cleotilde by ED also sent EMR order), order ECG for potential preoperative setting, depending on nature of management PT OT consultation if nonoperative as interval

## 2024-04-17 NOTE — Hospital Course (Signed)
 73 y.o. M with chronic right foot ulcer, CKD IIIa, HTN, HLD, MO, DM and anemia who presented after a fall in a [employer] parking lot, initially seen in the ER for hip pain, radiographs normal, discharged, then returned and CT showed a iliac crest fracture.  Ortho consulted, confirmed that this was indeed a nonoperative fracture, patient reported he could not walk and so was kept for SNF placement.  Incidentally, CT was suspicious for pathologic fracture and PSA was markedly elevated.

## 2024-04-17 NOTE — Assessment & Plan Note (Signed)
 Type 2 diabetes mellitus with hyperglycemia Glucose currently in the 220s Hold home metformin, continue home Lantus  15 units at decreased dose 10 units while n.p.o. and hold his home 4 units with meals and add sliding scale every 4 hours while n.p.o.

## 2024-04-17 NOTE — Plan of Care (Signed)
  Problem: Metabolic: Goal: Ability to maintain appropriate glucose levels will improve Outcome: Progressing   Problem: Nutritional: Goal: Maintenance of adequate nutrition will improve Outcome: Progressing   Problem: Tissue Perfusion: Goal: Adequacy of tissue perfusion will improve Outcome: Progressing   Problem: Education: Goal: Knowledge of General Education information will improve Description: Including pain rating scale, medication(s)/side effects and non-pharmacologic comfort measures Outcome: Progressing

## 2024-04-18 DIAGNOSIS — S72009A Fracture of unspecified part of neck of unspecified femur, initial encounter for closed fracture: Secondary | ICD-10-CM | POA: Diagnosis not present

## 2024-04-18 LAB — GLUCOSE, CAPILLARY
Glucose-Capillary: 155 mg/dL — ABNORMAL HIGH (ref 70–99)
Glucose-Capillary: 124 mg/dL — ABNORMAL HIGH (ref 70–99)
Glucose-Capillary: 104 mg/dL — ABNORMAL HIGH (ref 70–99)
Glucose-Capillary: 168 mg/dL — ABNORMAL HIGH (ref 70–99)
Glucose-Capillary: 214 mg/dL — ABNORMAL HIGH (ref 70–99)
Glucose-Capillary: 190 mg/dL — ABNORMAL HIGH (ref 70–99)

## 2024-04-18 LAB — HEMOGLOBIN A1C
Hgb A1c MFr Bld: 6.7 % — ABNORMAL HIGH (ref 4.8–5.6)
Mean Plasma Glucose: 146 mg/dL

## 2024-04-18 LAB — CALCIUM, IONIZED: Calcium, Ionized, Serum: 5.2 mg/dL (ref 4.5–5.6)

## 2024-04-18 MED ORDER — LISINOPRIL 5 MG PO TABS
5.0000 mg | ORAL_TABLET | Freq: Every day | ORAL | Status: DC
  Administered 2024-04-19 – 2024-04-23 (×4): 5 mg via ORAL
  Filled 2024-04-18 (×4): qty 1

## 2024-04-18 MED ORDER — ASPIRIN 81 MG PO TBEC
81.0000 mg | DELAYED_RELEASE_TABLET | Freq: Every day | ORAL | Status: DC
  Administered 2024-04-19 – 2024-04-23 (×5): 81 mg via ORAL
  Filled 2024-04-18 (×5): qty 1

## 2024-04-18 MED ORDER — MEDIHONEY WOUND/BURN DRESSING EX PSTE
1.0000 | PASTE | Freq: Every day | CUTANEOUS | Status: DC
  Administered 2024-04-18 – 2024-04-23 (×6): 1 via TOPICAL
  Filled 2024-04-18: qty 44

## 2024-04-18 NOTE — Evaluation (Signed)
 Physical Therapy Evaluation Patient Details Name: [name] MRN: [id number] DOB: 1950/12/15 Today's Date: 04/18/2024  History of Present Illness  Pt is a 73 y.o. male presenting to hospital 04/16/24 s/p fall at grocery store.  Imaging showing acute comminuted and displaced fx involving R iliac wing with displaced iliac crest fragment; small volume R retroperitoneal and pelvic hematoma.  Pt admitted with acute comminuted and displaced fx involving  the R iliac wing with displaced iliac crest fragment, concern for pathological source of fx, secondary hypercoagulable state, acute blood loss anemia, open wound of R foot, hematoma.  Per ortho, plan for conservative treatment.  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  Clinical Impression  PT/OT co-evaluation performed.  Prior to recent medical concerns, pt reports using manual w/c within home; uses RW to walk short distance to/from car and in/out of stores (uses electric cart in stores); lives with his sister on main level of home with a few steps to enter (pt reports normally able to navigate steps on his own).  Currently pt is 1-2 assist with bed mobility; mod to max assist x2 to stand up to RW (x3 trials from significantly elevated bed height); pt unable to take any steps with use of walker and 2 assist (heavy UE support noted through RW); and close SBA for lateral scooting to L along bed (elevated bed height).  Pt reporting cramping type pain in R side/hip/gluteal area during standing activities.  Pt would currently benefit from skilled PT to address noted impairments and functional limitations (see below for any additional details).  Upon hospital discharge, pt would benefit from ongoing therapy.     If plan is discharge home, recommend the following: Two people to help with walking and/or transfers;A lot of help with  bathing/dressing/bathroom;Assistance with cooking/housework;Assist for transportation;Help with stairs or ramp for entrance   Can travel by private vehicle    No    Equipment Recommendations Other (comment) (Pt has RW and manual w/c at home already)  Recommendations for Other Services       Functional Status Assessment Patient has had a recent decline in their functional status and demonstrates the ability to make significant improvements in function in a reasonable and predictable amount of time.     Precautions / Restrictions Precautions Precautions: Fall Recall of Precautions/Restrictions: Impaired Restrictions Weight Bearing Restrictions Per Provider Order: Yes RLE Weight Bearing Per Provider Order: Weight bearing as tolerated Other Position/Activity Restrictions: WBAT R LE per secure message with MD Cleotilde and MD Jonel 04/18/24.  Post op shoe R LE.      Mobility  Bed Mobility Overal bed mobility: Needs Assistance Bed Mobility: Supine to Sit, Sit to Supine     Supine to sit: Max assist, +2 for safety/equipment Sit to supine: Max assist, +2 for physical assistance   General bed mobility comments: vc's for technique    Transfers Overall transfer level: Needs assistance Equipment used: Rolling walker (2 wheels) Transfers: Sit to/from Stand Sit to Stand: From elevated surface, Max assist, Mod assist, +2 physical assistance           General transfer comment: x3 trials standing; max assist x2 1st trial and mod assist x2 2nd and 3rd trials; vc's for UE positioning and overall technique; pt with difficulty fully standing upright (heavy UE support noted on RW)    Ambulation/Gait Ambulation/Gait assistance: +2 physical assistance Gait  Distance (Feet): 0 Feet Assistive device: Rolling walker (2 wheels)         General Gait Details: pt unable to take any steps with 2 assist and cueing; heavy UE noted through RW  Stairs            Wheelchair Mobility      Tilt Bed    Modified Rankin (Stroke Patients Only)       Balance Overall balance assessment: Needs assistance Sitting-balance support: Bilateral upper extremity supported, Feet supported Sitting balance-Leahy Scale: Fair Sitting balance - Comments: steady static sitting   Standing balance support: Bilateral upper extremity supported, Reliant on assistive device for balance Standing balance-Leahy Scale: Poor Standing balance comment: posterior lean noted in standing and difficulty obtaining full upright positioning; heavy UE support on RW                             Pertinent Vitals/Pain Pain Assessment Pain Assessment: Faces Faces Pain Scale: Hurts even more Pain Location: R side/hip/glutes Pain Descriptors / Indicators: Cramping Pain Intervention(s): Limited activity within patient's tolerance, Monitored during session, Premedicated before session, Repositioned    Home Living Family/patient expects to be discharged to:: Private residence Living Arrangements: Other relatives (Sister) Available Help at Discharge: Family;Available 24 hours/day Type of Home: House Home Access: Stairs to enter   [employer] of Steps: 2 (B railings) plus 1 (no railing)   Home Layout: One level (Doesn't need to go to basement) Home Equipment: Grab bars - tub/shower;Shower Counsellor (2 wheels);Wheelchair - manual;Hand held shower head;BSC/3in1      Prior Function Prior Level of Function : Needs assist             Mobility Comments: Uses manual w/c in house; uses RW going to car or in community short distances (parking lot to store); uses [employer] in stores.  Only 1 recent fall.  Walks about 20 feet before getting SOB (10-15 minutes to recover d/t SOB).  Navigates stairs on his own.  Sleeps in recliner. ADLs Comments: Sister (who doesn't live with pt) assists with driving. Independent with medication, dressing, and showers (sits on shower chair).      Extremity/Trunk Assessment   Upper Extremity Assessment Upper Extremity Assessment: Defer to OT evaluation    Lower Extremity Assessment Lower Extremity Assessment: Generalized weakness (R TMA)    Cervical / Trunk Assessment Cervical / Trunk Assessment: Other exceptions Cervical / Trunk Exceptions: forward head/shoulders  Communication   Communication Communication: No apparent difficulties    Cognition Arousal: Alert Behavior During Therapy: WFL for tasks assessed/performed   PT - Cognitive impairments: No family/caregiver present to determine baseline, Memory, Other, Problem solving                       PT - Cognition Comments: Decreased recall Following commands: Impaired Following commands impaired: Follows multi-step commands with increased time     Cueing Cueing Techniques: Verbal cues, Visual cues     General Comments General comments (skin integrity, edema, etc.): SpO2 sats 87-92% on room air during session (92% end of session at rest with HR 120 bpm) and HR up to 127 bpm with activity; nurse notified regarding pt's HR and SpO2 vitals during session.  Nursing cleared pt for participation in physical therapy.  Pt agreeable to PT/OT session.    Exercises     Assessment/Plan    PT Assessment Patient needs continued PT services  PT  Problem List Decreased strength;Decreased activity tolerance;Decreased balance;Decreased mobility;Decreased knowledge of precautions;Cardiopulmonary status limiting activity;Pain       PT Treatment Interventions DME instruction;Gait training;Stair training;Functional mobility training;Therapeutic activities;Therapeutic exercise;Balance training;Patient/family education    PT Goals (Current goals can be found in the Care Plan section)  Acute Rehab PT Goals Patient Stated Goal: to improve strength and walking PT Goal Formulation: With patient Time For Goal Achievement: 05/02/24 Potential to Achieve Goals: Good     Frequency Min 2X/week     Co-evaluation PT/OT/SLP Co-Evaluation/Treatment: Yes Reason for Co-Treatment: For patient/therapist safety;To address functional/ADL transfers PT goals addressed during session: Mobility/safety with mobility;Balance;Proper use of DME OT goals addressed during session: ADL's and self-care       AM-PAC PT 6 Clicks Mobility  Outcome Measure Help needed turning from your back to your side while in a flat bed without using bedrails?: A Lot Help needed moving from lying on your back to sitting on the side of a flat bed without using bedrails?: A Lot Help needed moving to and from a bed to a chair (including a wheelchair)?: Total Help needed standing up from a chair using your arms (e.g., wheelchair or bedside chair)?: Total Help needed to walk in hospital room?: Total Help needed climbing 3-5 steps with a railing? : Total 6 Click Score: 8    End of Session Equipment Utilized During Treatment: Gait belt Activity Tolerance: Patient limited by fatigue Patient left: in bed;with call bell/phone within reach;with bed alarm set;with nursing/sitter in room;Other (comment) (NT present obtaining pt's vitals) Nurse Communication: Mobility status;Precautions;Weight bearing status;Other (comment) (Pt's pain and HR/SpO2 sats during session) PT Visit Diagnosis: Other abnormalities of gait and mobility (R26.89);Muscle weakness (generalized) (M62.81);History of falling (Z91.81);Pain Pain - Right/Left: Right Pain - part of body: Hip    Time: 8561-8491 PT Time Calculation (min) (ACUTE ONLY): 30 min   Charges:   PT Evaluation $PT Eval Low Complexity: 1 Low PT Treatments $Therapeutic Activity: 8-22 mins PT General Charges $$ ACUTE PT VISIT: 1 Visit        Damien Caulk, PT 04/18/24, 3:36 PM

## 2024-04-18 NOTE — Progress Notes (Signed)
  Progress Note   Patient: [name] FMW:[id number] DOB: [birth year] DOA: 04/16/2024     1 DOS: the patient was seen and examined on 04/18/2024 at 10:45AM      Brief hospital course: 73 y.o. M with chronic right foot ulcer, CKD IIIa, HTN, HLD, MO, DM and anemia who presented after a fall in a [employer] parking lot, initially seen in the ER for hip pain, radiographs normal, discharged, then returned and CT showed a iliac crest fracture.  Ortho consulted, confirmed that this was indeed a nonoperative fracture, patient reported he could not walk and so was kept for SNF placement.  Incidentally, CT was suspicious for pathologic fracture and PSA was markedly elevated.     Assessment and Plan: *Iliac crest fracture Suspected pathologic fracture Admitted and evaluated by orthopedics.  They recommend nonoperative management, workup for possible malignancy given lucency on imaging.  It appears from notes that this workup for malignancy was going to include SPEP, outpatient referral for colonoscopy, but PSA appears to be markedly elevated - IR consulted, they recommend MRI pelvis prior to biopsy -Plan for urology evaluation after discharge   Normocytic anemia Acute blood loss anemia ruled out Based on labs from last Oct, this appears to be new. -Continue oral iron  - Check iron  studies, B12, folate, SPEP  Chronic right foot ulcer -Continue wound care  Chronic kidney disease stage IIIa Creatinine stable relative to baseline  Hypertension Hyperlipidemia Peripheral vascular disease BP near normal  -Resume aspirin , lisinopril  - Continue atorvastatin   Morbid obesity BMI 41.9, complicates care, class III obesity  Diabetes Glucose controlled -Hold metformin - Continue glargine - Continue sliding scale corrections              Subjective: Patient is having pain in his hip.  Unable to walk.  He is incontinent.  He has no confusion or fever.     Physical Exam: BP  (!) 147/76 (BP Location: Right Arm)   Pulse (!) 108   Temp 98 F (36.7 C) (Oral)   Resp 20   Ht 5' 11 (1.803 m)   Wt (!) 136.5 kg   SpO2 93%   BMI 41.97 kg/m   Obese adult male, lying in bed, interactive and appropriate Tachycardic, regular, no murmurs, no peripheral edema Respiratory rate normal, lungs clear without rales or wheezes He appears sedentary and has discomfort with movement.  Attention is normal, oriented to person, place, time, situation, speech fluent    Data Reviewed: Discussed with interventional radiology CBC shows stable anemia     Family Communication:      Disposition: Status is: Inpatient Elderly adult male, admitted for fall and found to have pathologic iliac crest fracture.  PT recommends SNF  We will begin workup for malignancy while in the hospital, hopefully can obtain biopsy prior to discharge.  If not, he will need urology and oncology follow-up and plans for biopsy after discharge        Author: Lonni SHAUNNA Dalton, MD 04/18/2024 4:26 PM  For on call review [email or web address].

## 2024-04-18 NOTE — Plan of Care (Signed)

## 2024-04-18 NOTE — Consult Note (Signed)
 WOC Nurse Consult Note: Reason for Consult: Requested to assess a lateral R foot wound. Performed remotely evaluating photo and notes. Wound type: DFU Pressure Injury POA: NA Measurement: aprox. 2.5 cm x 1 cm Wound bed: 100% pale. Keratosis peri-wound with dark necrosis. Drainage (amount, consistency, odor) Scant amount. Periwound: keratosis. Dressing procedure/placement/frequency: Cleanse the wound with Vashe #[id number], pat dry the periwound skin. Apply Medihoney to the wound bed. Apply thin layer of barrier cream on the hyperkeratosis. Cover the wound bed and the keratosis with the foam dressing. Change daily or PRN.  WOC team will not plan to follow further. Please reconsult if further assistance is needed. Thank-you,  Lela Holm RN, CNS, [employer], MSN.  (Phone [phone])

## 2024-04-18 NOTE — Evaluation (Signed)
 Occupational Therapy Evaluation Patient Details Name: [name] MRN: [id number] DOB: [birth year] Today's Date: 04/18/2024   History of Present Illness   Pt is a 73 y.o. male presenting to hospital 04/16/24 s/p fall at grocery store.  Imaging showing acute comminuted and displaced fx involving R iliac wing with displaced iliac crest fragment; small volume R retroperitoneal and pelvic hematoma.  Pt admitted with acute comminuted and displaced fx involving  the R iliac wing with displaced iliac crest fragment, concern for pathological source of fx, secondary hypercoagulable state, acute blood loss anemia, open wound of R foot, hematoma.  Per ortho, plan for conservative treatment.  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     Clinical Impressions Pt was seen for OT evaluation and co-tx with PT this date. Prior to hospital admission, pt reports being mod indep with ADL, cooking, and cleaning at home. Sister assisting PRN with IADL and another sister helps with transportation. Pt denies additional falls. Pt presents with deficits in strength, balance, activity tolerance, cardiopulm status, and questionable memory, affecting safe and optimal ADL completion. Pt currently requires heavy +2 assist for ADL mobility, MAX A for LB ADL in sitting, and endorsed feeling SOB and 8/10 PRE after limited exertion. Pt would benefit from skilled OT services to address noted impairments and functional limitations (see below for any additional details) in order to maximize safety and independence while minimizing future risk of falls, injury, and readmission. Anticipate the need for follow up OT services upon acute hospital DC.    If plan is discharge home, recommend the following:   Two people to help with walking and/or transfers;A lot of help with bathing/dressing/bathroom;Assist for  transportation;Assistance with cooking/housework;Help with stairs or ramp for entrance     Functional Status Assessment   Patient has had a recent decline in their functional status and demonstrates the ability to make significant improvements in function in a reasonable and predictable amount of time.     Equipment Recommendations   Other (comment) (defer)     Recommendations for Other Services         Precautions/Restrictions   Precautions Precautions: Fall Recall of Precautions/Restrictions: Impaired Restrictions Weight Bearing Restrictions Per Provider Order: Yes RLE Weight Bearing Per Provider Order: Weight bearing as tolerated Other Position/Activity Restrictions: WBAT R LE per secure message with MD Cleotilde and MD Jonel 04/18/24.  Post op shoe R LE.     Mobility Bed Mobility Overal bed mobility: Needs Assistance Bed Mobility: Supine to Sit, Sit to Supine     Supine to sit: Max assist, +2 for safety/equipment Sit to supine: Max assist, +2 for physical assistance   General bed mobility comments: vc's for technique    Transfers Overall transfer level: Needs assistance Equipment used: Rolling walker (2 wheels) Transfers: Sit to/from Stand, Bed to chair/wheelchair/BSC Sit to Stand: From elevated surface, Max assist, Mod assist, +2 physical assistance          Lateral/Scoot Transfers: Min assist, Contact guard assist General transfer comment: x3 trials standing; max assist x2 1st trial and mod assist x2 2nd and 3rd trials; vc's for UE positioning and overall technique; pt with difficulty fully standing upright (heavy UE support noted on RW)      Balance Overall balance assessment: Needs assistance Sitting-balance support: Bilateral upper extremity supported, Feet supported Sitting balance-Leahy Scale: Fair Sitting balance - Comments: steady static sitting  Standing balance support: Bilateral upper extremity supported, Reliant on assistive device for  balance Standing balance-Leahy Scale: Poor Standing balance comment: posterior lean noted in standing and difficulty obtaining full upright positioning; heavy UE support on RW                           ADL either performed or assessed with clinical judgement   ADL Overall ADL's : Needs assistance/impaired     Grooming: Sitting;Set up               Lower Body Dressing: Sitting/lateral leans;Sit to/from stand;Maximal assistance Lower Body Dressing Details (indicate cue type and reason): donning socks seated EOB, would require +2 for LB dressing when including STS transfers                     Vision         Perception         Praxis         Pertinent Vitals/Pain Pain Assessment Pain Assessment: Faces Faces Pain Scale: Hurts even more Pain Location: R side/hip/glutes Pain Descriptors / Indicators: Cramping Pain Intervention(s): Limited activity within patient's tolerance, Monitored during session, Premedicated before session, Repositioned     Extremity/Trunk Assessment Upper Extremity Assessment Upper Extremity Assessment: Generalized weakness   Lower Extremity Assessment Lower Extremity Assessment: Generalized weakness (R TMA)   Cervical / Trunk Assessment Cervical / Trunk Assessment: Other exceptions Cervical / Trunk Exceptions: forward head/shoulders   Communication Communication Communication: No apparent difficulties   Cognition Arousal: Alert Behavior During Therapy: WFL for tasks assessed/performed Cognition: No family/caregiver present to determine baseline             OT - Cognition Comments: Pt demo's difficulty with immediate and delayed recall                 Following commands: Impaired Following commands impaired: Follows multi-step commands with increased time, Follows multi-step commands inconsistently     Cueing  General Comments   Cueing Techniques: Verbal cues;Gestural cues;Tactile cues;Visual  cues  SpO2 sats 87-92% on room air during session (92% end of session at rest with HR 120bpm) and HR up to 127 bpm with activity; nurse notified regarding pt's HR and SpO2 vitals during session   Exercises     Shoulder Instructions      Home Living Family/patient expects to be discharged to:: Private residence Living Arrangements: Other relatives (Sister) Available Help at Discharge: Family;Available 24 hours/day Type of Home: House Home Access: Stairs to enter [employer] of Steps: 2 (B railings) plus 1 (no railing)   Home Layout: One level (Doesn't need to go to basement)     Bathroom Shower/Tub: Tub/shower unit   Bathroom Toilet: Standard (BSC over toilet)     Home Equipment: Grab bars - tub/shower;Shower Counsellor (2 wheels);Wheelchair - manual;Hand held shower head;BSC/3in1          Prior Functioning/Environment Prior Level of Function : Needs assist             Mobility Comments: Uses manual w/c in house; uses RW going to car or in community short distances (parking lot to store); uses [employer] in stores.  Only 1 recent fall.  Walks about 20 feet before getting SOB (10-15 minutes to recover d/t SOB).  Navigates stairs on his own.  Sleeps in recliner. ADLs Comments: Sister (who doesn't live with pt) assists with driving. Independent with medication, dressing, and showers (  sits on shower chair).    OT Problem List: Decreased strength;Pain;Cardiopulmonary status limiting activity;Decreased range of motion;Decreased safety awareness;Decreased activity tolerance;Impaired balance (sitting and/or standing);Decreased knowledge of use of DME or AE;Obesity   OT Treatment/Interventions: Self-care/ADL training;Therapeutic exercise;Therapeutic activities;DME and/or AE instruction;Energy conservation;Patient/family education;Balance training      OT Goals(Current goals can be found in the care plan section)   Acute Rehab OT Goals Patient Stated  Goal: get better and go home OT Goal Formulation: With patient Time For Goal Achievement: 05/02/24 Potential to Achieve Goals: Good ADL Goals Pt Will Perform Lower Body Dressing: with adaptive equipment;sit to/from stand;sitting/lateral leans;with mod assist Pt Will Transfer to Toilet: with mod assist;bedside commode (LRAD) Pt Will Perform Toileting - Clothing Manipulation and hygiene: with adaptive equipment;sitting/lateral leans;sit to/from stand;with supervision   OT Frequency:  Min 2X/week    Co-evaluation PT/OT/SLP Co-Evaluation/Treatment: Yes Reason for Co-Treatment: For patient/therapist safety;To address functional/ADL transfers PT goals addressed during session: Mobility/safety with mobility;Balance;Proper use of DME OT goals addressed during session: ADL's and self-care      AM-PAC OT 6 Clicks Daily Activity     Outcome Measure Help from another person eating meals?: None Help from another person taking care of personal grooming?: A Little Help from another person toileting, which includes using toliet, bedpan, or urinal?: A Lot Help from another person bathing (including washing, rinsing, drying)?: A Lot Help from another person to put on and taking off regular upper body clothing?: A Little Help from another person to put on and taking off regular lower body clothing?: A Lot 6 Click Score: 16   End of Session Equipment Utilized During Treatment: Gait belt;Rolling walker (2 wheels) Nurse Communication: Mobility status;Other (comment) (vitals)  Activity Tolerance: Patient tolerated treatment well Patient left: in bed;with call bell/phone within reach;with bed alarm set;with nursing/sitter in room  OT Visit Diagnosis: Other abnormalities of gait and mobility (R26.89);Muscle weakness (generalized) (M62.81);History of falling (Z91.81);Pain Pain - Right/Left: Right Pain - part of body: Hip                Time: 8562-8491 OT Time Calculation (min): 31 min Charges:  OT  General Charges $OT Visit: 1 Visit OT Evaluation $OT Eval Moderate Complexity: 1 Mod  Warren SAUNDERS., MPH, MS, OTR/L ascom [phone] 04/18/24, 4:18 PM

## 2024-04-19 ENCOUNTER — Inpatient Hospital Stay

## 2024-04-19 DIAGNOSIS — N1831 Chronic kidney disease, stage 3a: Secondary | ICD-10-CM | POA: Diagnosis not present

## 2024-04-19 DIAGNOSIS — I10 Essential (primary) hypertension: Secondary | ICD-10-CM

## 2024-04-19 DIAGNOSIS — S72009A Fracture of unspecified part of neck of unspecified femur, initial encounter for closed fracture: Secondary | ICD-10-CM | POA: Diagnosis not present

## 2024-04-19 DIAGNOSIS — M84550A Pathological fracture in neoplastic disease, pelvis, initial encounter for fracture: Secondary | ICD-10-CM | POA: Diagnosis not present

## 2024-04-19 DIAGNOSIS — E538 Deficiency of other specified B group vitamins: Secondary | ICD-10-CM | POA: Diagnosis not present

## 2024-04-19 LAB — BASIC METABOLIC PANEL WITH GFR
Anion gap: 10 (ref 5–15)
BUN: 22 mg/dL (ref 8–23)
CO2: 25 mmol/L (ref 22–32)
Calcium: 8.9 mg/dL (ref 8.9–10.3)
Chloride: 101 mmol/L (ref 98–111)
Creatinine, Ser: 1 mg/dL (ref 0.61–1.24)
GFR, Estimated: >60 mL/min (ref >60)
Glucose, Bld: 238 mg/dL — ABNORMAL HIGH (ref 70–99)
Potassium: 4.4 mmol/L (ref 3.5–5.1)
Sodium: 136 mmol/L (ref 135–145)

## 2024-04-19 LAB — CBC
HCT: 25.4 % — ABNORMAL LOW (ref 39.0–52.0)
Hemoglobin: 8.4 g/dL — ABNORMAL LOW (ref 13.0–17.0)
MCH: 29.2 pg (ref 26.0–34.0)
MCHC: 33.1 g/dL (ref 30.0–36.0)
MCV: 88.2 fL (ref 80.0–100.0)
Platelets: 243 K/uL (ref 150–400)
RBC: 2.88 MIL/uL — ABNORMAL LOW (ref 4.22–5.81)
RDW: 14.6 % (ref 11.5–15.5)
WBC: 5.3 K/uL (ref 4.0–10.5)
nRBC: 0 % (ref 0.0–0.2)

## 2024-04-19 LAB — IRON AND TIBC
Iron: 31 ug/dL — ABNORMAL LOW (ref 45–182)
Saturation Ratios: 16 % — ABNORMAL LOW (ref 17.9–39.5)
TIBC: 200 ug/dL — ABNORMAL LOW (ref 250–450)
UIBC: 169 ug/dL

## 2024-04-19 LAB — FOLATE: Folate: 12.1 ng/mL (ref >5.9)

## 2024-04-19 LAB — VITAMIN B12: Vitamin B-12: 132 pg/mL — ABNORMAL LOW (ref 180–914)

## 2024-04-19 LAB — GLUCOSE, CAPILLARY
Glucose-Capillary: 229 mg/dL — ABNORMAL HIGH (ref 70–99)
Glucose-Capillary: 221 mg/dL — ABNORMAL HIGH (ref 70–99)
Glucose-Capillary: 230 mg/dL — ABNORMAL HIGH (ref 70–99)
Glucose-Capillary: 230 mg/dL — ABNORMAL HIGH (ref 70–99)

## 2024-04-19 LAB — CEA: CEA: 4.1 ng/mL (ref 0.0–4.7)

## 2024-04-19 LAB — FERRITIN: Ferritin: 482 ng/mL — ABNORMAL HIGH (ref 24–336)

## 2024-04-19 MED ORDER — GADOBUTROL 1 MMOL/ML IV SOLN
10.0000 mL | Freq: Once | INTRAVENOUS | Status: AC | PRN
  Administered 2024-04-19: 10 mL via INTRAVENOUS

## 2024-04-19 MED ORDER — INSULIN ASPART 100 UNIT/ML IJ SOLN
3.0000 [IU] | Freq: Three times a day (TID) | INTRAMUSCULAR | Status: DC
  Administered 2024-04-19 – 2024-04-22 (×6): 3 [IU] via SUBCUTANEOUS
  Filled 2024-04-19 (×6): qty 1

## 2024-04-19 MED ORDER — VITAMIN B-12 1000 MCG PO TABS
1000.0000 ug | ORAL_TABLET | Freq: Every day | ORAL | Status: DC
  Administered 2024-04-19 – 2024-04-23 (×5): 1000 ug via ORAL
  Filled 2024-04-19 (×5): qty 1

## 2024-04-19 MED ORDER — INSULIN ASPART 100 UNIT/ML IJ SOLN
0.0000 [IU] | Freq: Three times a day (TID) | INTRAMUSCULAR | Status: DC
  Administered 2024-04-19: 5 [IU] via SUBCUTANEOUS
  Administered 2024-04-20: 3 [IU] via SUBCUTANEOUS
  Filled 2024-04-19 (×2): qty 1

## 2024-04-19 MED ORDER — ORAL CARE MOUTH RINSE: 15.0000 mL | OROMUCOSAL | Status: DC | PRN

## 2024-04-19 MED ORDER — CYANOCOBALAMIN 1000 MCG/ML IJ SOLN
1000.0000 ug | Freq: Every day | INTRAMUSCULAR | Status: DC
  Administered 2024-04-19: 1000 ug via INTRAMUSCULAR
  Filled 2024-04-19 (×2): qty 1

## 2024-04-19 MED ORDER — INSULIN GLARGINE 100 UNIT/ML ~~LOC~~ SOLN
15.0000 [IU] | Freq: Every day | SUBCUTANEOUS | Status: DC
  Administered 2024-04-19 – 2024-04-21 (×3): 15 [IU] via SUBCUTANEOUS
  Filled 2024-04-19 (×3): qty 0.15

## 2024-04-19 NOTE — Inpatient Diabetes Management (Signed)
 Inpatient Diabetes Program Recommendations  AACE/ADA: New Consensus Statement on Inpatient Glycemic Control (2015)  Target Ranges:  Prepandial:   less than 140 mg/dL      Peak postprandial:   less than 180 mg/dL (1-2 hours)      Critically ill patients:  140 - 180 mg/dL   Lab Results  Component Value Date   GLUCAP 229 (H) 04/19/2024   HGBA1C 6.7 (H) 04/17/2024    Latest Reference Range & Units 04/17/24 23:49 04/18/24 03:32 04/18/24 08:13 04/18/24 11:43 04/18/24 16:16 04/18/24 20:24 04/18/24 23:21 04/19/24 08:16  Glucose-Capillary 70 - 99 mg/dL 707 (H) Novolog  8 units @ 0124 155 (H) Novolog  3 units  124 (H) Novolog  2 units  104 (H) 168 (H) Novolog  3 units  214 (H) Novolog  5 units  190 (H) Novolog  3 units  Novolog  3 units @ 0448 229 (H) Novolog  5 units   (H): Data is abnormally high  Diabetes history: DM2 Outpatient Diabetes medications: Lantus  15 units daily, Humalog 4 units tid meal coverage, Metformin 1 gm bid Current orders for Inpatient glycemic control: Semglee  10 units daily, Novolog  0-15 units q 4 hrs.  Inpatient Diabetes Program Recommendations:   Patient has received total of Novolog  22 units over the past 24 hrs. Please consider: -Increase Semglee  to 15 units daily -Add Novolog  3-4 units tid meal coverage if eats 50% meal -Decrease Novolog  correction to 0-9 units tid, 0-5 units hs   Thank you, Lucion Dilger E. Tamberlyn Midgley, RN, MSN, CDCES  Diabetes Coordinator Inpatient Glycemic Control Team Team Pager [phone] (8am-5pm) 04/19/2024 11:15 AM

## 2024-04-19 NOTE — NC FL2 (Signed)
 [place]  MEDICAID FL2 LEVEL OF CARE FORM     IDENTIFICATION  Patient Name: [name] Birthdate: [birth year] Sex: male Admission Date (Current Location): 04/16/2024  [facility] and [place] Number:  Chiropodist and Address:  [facility], [address], [place], [place] [address]      Provider Number: [id number]  Attending Physician Name and Address:  Tobie Calix, MD  Relative Name and Phone Number:       Current Level of Care: Hospital Recommended Level of Care: Skilled Nursing Facility Prior Approval Number:    Date Approved/Denied:   PASRR Number: [id number]  Discharge Plan: SNF    Current Diagnoses: Patient Active Problem List   Diagnosis Date Noted   Hip fracture (HCC) 04/17/2024   Pathological fracture 04/17/2024   Secondary hypercoagulable state (HCC) 04/17/2024   Hematoma 04/17/2024   Acute blood loss anemia 04/17/2024   Open wound of right foot 04/17/2024   CKD stage 3a, GFR 45-59 ml/min (HCC) 04/17/2024   Encounter for screening colonoscopy 08/20/2023   Polyp of transverse colon 08/20/2023   Polyp of ascending colon 08/20/2023   Diabetic infection of right foot (HCC) 02/19/2023   Atherosclerosis of native arteries of the extremities with ulceration (HCC) 07/14/2022   Diabetic foot infection (HCC) 01/03/2022   Hyperkalemia 01/03/2022   PAD (peripheral artery disease) (HCC)    HTN (hypertension)    Type II diabetes mellitus with renal manifestations (HCC)    Acute renal failure superimposed on stage 3a chronic kidney disease (HCC)    Iron  deficiency anemia    Diabetes 1.5, 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   Lymphedema 01/11/2018   Chronic venous insufficiency 01/11/2018   Venous ulcer (HCC) 09/28/2017   Ulcer of foot with necrosis of bone (HCC) 10/30/2016   DM (diabetes mellitus), type 2 (HCC) 10/30/2016   Hyperlipidemia 10/30/2016   Chronic kidney disease (CKD) stage G1/A3, glomerular filtration rate (GFR)  equal to or greater than 90 mL/min/1.73 square meter and albuminuria creatinine ratio greater than 300 mg/g 10/27/2016   Pressure injury of skin 10/04/2016   Septic shock (HCC) 10/03/2016   Erectile dysfunction 07/20/2014   Obesity 12/18/2010   Hypertension 02/19/2007    Orientation RESPIRATION BLADDER Height & Weight     Self, Time, Situation, Place  Normal Continent Weight: (!) 300 lb 14.9 oz (136.5 kg) Height:  5' 11 (180.3 cm)  BEHAVIORAL SYMPTOMS/MOOD NEUROLOGICAL BOWEL NUTRITION STATUS      Continent Diet (Carb Modified)  AMBULATORY STATUS COMMUNICATION OF NEEDS Skin   Limited Assist Verbally Bruising (Pressure injury right and left Buttock, Diabetic Ulcer Right Heel, Pretibial Distal Right anterior)                       Personal Care Assistance Level of Assistance  Bathing, Dressing, Feeding Bathing Assistance: Limited assistance Feeding assistance: Independent Dressing Assistance: Limited assistance     Functional Limitations Info             SPECIAL CARE FACTORS FREQUENCY  PT (By licensed PT), OT (By licensed OT)     PT Frequency: 5x/week OT Frequency: 5x/week            Contractures      Additional Factors Info  Code Status, Allergies Code Status Info: full Allergies Info: NKA           Current Medications (04/19/2024):  This is the current hospital active medication list Current Facility-Administered Medications  Medication Dose Route Frequency  Provider Last Rate Last Admin   acetaminophen  (TYLENOL ) tablet 650 mg  650 mg Oral Q6H PRN Core, Prentice BROCKS, MD       Or   acetaminophen  (TYLENOL ) suppository 650 mg  650 mg Rectal Q6H PRN Core, Prentice BROCKS, MD       aspirin  EC tablet 81 mg  81 mg Oral Daily Danford, Christopher P, MD   81 mg at 04/19/24 9070   atorvastatin  (LIPITOR) tablet 10 mg  10 mg Oral QPM Core, Prentice BROCKS, MD   10 mg at 04/18/24 1711   dextromethorphan -guaiFENesin  (MUCINEX  DM) 30-600 MG per 12 hr tablet 1 tablet  1 tablet Oral BID  Kandis Devaughn Sayres, MD   1 tablet at 04/19/24 0930   ferrous sulfate  tablet 325 mg  325 mg Oral BID WC Core, Prentice BROCKS, MD   325 mg at 04/19/24 9070   insulin  aspart (novoLOG ) injection 0-15 Units  0-15 Units Subcutaneous Q4H Core, Prentice BROCKS, MD   5 Units at 04/19/24 1202   insulin  aspart (novoLOG ) injection 3 Units  3 Units Subcutaneous TID WC Patel, Sona, MD       insulin  glargine (LANTUS ) injection 15 Units  15 Units Subcutaneous QHS Patel, Sona, MD       leptospermum manuka honey (MEDIHONEY) paste 1 Application  1 Application Topical Daily Danford, Christopher P, MD   1 Application at 04/19/24 0932   lisinopril  (ZESTRIL ) tablet 5 mg  5 mg Oral Daily Danford, Christopher P, MD   5 mg at 04/19/24 9070   oxyCODONE  (Oxy IR/ROXICODONE ) immediate release tablet 5 mg  5 mg Oral Q4H PRN Core, Prentice BROCKS, MD   5 mg at 04/19/24 1420   pneumococcal 20-valent conjugate vaccine (PREVNAR 20 ) injection 0.5 mL  0.5 mL Intramuscular Tomorrow-1000 Wouk, Devaughn Sayres, MD       polyethylene glycol (MIRALAX  / GLYCOLAX ) packet 17 g  17 g Oral Daily PRN Core, Prentice BROCKS, MD   17 g at 04/19/24 0532     Discharge Medications: Please see discharge summary for a list of discharge medications.  Relevant Imaging Results:  Relevant Lab Results:   Additional Information SSN: [id number]  Alvaro Louder, LCSW

## 2024-04-19 NOTE — Progress Notes (Signed)
 PT Cancellation Note  Patient Details Name: ORDEAN FOUTS MRN: [id number] DOB: 1951-03-15   Cancelled Treatment:    Reason Eval/Treat Not Completed: Other (comment).  MR pelvis imaging showing Widespread osseous metastatic disease or multiple myeloma with innumerable enhancing osseous lesions throughout the lumbar spine, pelvis and both proximal femurs as described. Large lesions in both femoral necks and intertrochanteric regions are prone to fracture.  D/t concerning imaging results, currently pending clarification regarding B LE WB'ing precautions/implications from ortho (secure message sent to MD Tobie and MD Cleotilde to address concerns).  Will monitor pt's status and for WB'ing recommendations; will re-attempt PT session at a later date/time.  Damien Caulk, PT 04/19/24, 3:54 PM

## 2024-04-19 NOTE — Consult Note (Signed)
 Chief Complaint: Patient was seen in consultation today for  Chief Complaint  Patient presents with   Groin Pain   Referring Physician(s): Dr. Jonel   Supervising Physician: Jenna Hacker  Patient Status: [facility] - In-pt  History of Present Illness: [name] is a 73 y.o. male with a past medical history significant for HTN, anemia, venous insufficiency, DM2, DVT and chronic kidney disease. He presented to the [facility] ED 04/16/24 after he fell and had right hip pain with difficulty ambulating. A CT scan revealed a right iliac wing fracture and further imaging showed osseous lesions concerning for metastatic disease.   MR Pelvis 04/19/24 IMPRESSION: 1. 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 Large lesions in both femoral necks and intertrochanteric regions are prone to fracture. 2. Displaced pathologic fracture of the right iliac bone with probable slightly enlarged hematoma between the displaced fracture fragments. 3. Pelvic and inguinal adenopathy, likely metastatic. 4. Mild enlargement of the prostate gland with heterogeneous central gland enlargement and a 1.9 cm lesion in the posterior peripheral zone near the apex. Findings could certainly be secondary to metastatic prostate cancer, and the patient's serum PSA level was markedly elevated (1,123) on recent lab analysis.  Interventional Radiology has been asked to evaluate this patient for an image-guided bone lesion biopsy. Imaging reviewed and procedure approved by Dr. Jenna.   Past Medical History:  Diagnosis Date   Arthritis    Bladder incontinence    Chronic kidney disease    Stage 3 Kidney disease   Diabetes mellitus without complication (HCC)    DVT of leg (deep venous thrombosis) (HCC)    H/O RIGHT LEG 2018   Dyspnea    occassional   Hyperlipidemia     Hypertension    Peripheral vascular disease (HCC)    Sepsis (HCC)     Past Surgical History:  Procedure Laterality Date   ABDOMINAL AORTOGRAM W/LOWER EXTREMITY N/A 10/10/2016   Procedure: Abdominal Aortogram w/Lower Extremity;  Surgeon: Hacker KANDICE Shawl, MD;  Location: [facility] INVASIVE CV LAB;  Service: Cardiovascular;  Laterality: N/A;   CATARACT EXTRACTION W/PHACO Left 05/27/2018   Procedure: CATARACT EXTRACTION PHACO AND INTRAOCULAR LENS PLACEMENT (IOC);  Surgeon: Myrna Adine Anes, MD;  Location: [facility] ORS;  Service: Ophthalmology;  Laterality: Left;  US  03:06.2 AP% 16.1 CDE 30.16 FLUID PACK LOT @ [id number] H   CATARACT EXTRACTION W/PHACO Right 07/13/2023   Procedure: CATARACT EXTRACTION PHACO AND INTRAOCULAR LENS PLACEMENT (IOC) RIGHT DIABETIC 4.00 00:36.6;  Surgeon: Myrna Adine Anes, MD;  Location: [facility];  Service: Ophthalmology;  Laterality: Right;   CIRCUMCISION  1998   COLONOSCOPY WITH PROPOFOL  N/A 08/20/2023   Procedure: COLONOSCOPY WITH PROPOFOL ;  Surgeon: Unk Corinn Skiff, MD;  Location: [facility] END[facility]COPY;  Service: Gastroenterology;  Laterality: N/A;   EYE SURGERY Left    Retina Detachment   GRAFT APPLICATION Right 05/29/2017   Procedure: GRAFT APPLICATION ( RIGHT FOOT );  Surgeon: Shawl Hacker KANDICE, MD;  Location: [facility] ORS;  Service: Vascular;  Laterality: Right;  graft taken from patients right thigh   IRRIGATION AND DEBRIDEMENT FOOT Right 10/03/2016   Procedure: IRRIGATION AND DEBRIDEMENT FOOT;  Surgeon: Eva Gay, DPM;  Location: [facility] ORS;  Service: Podiatry;  Laterality: Right;   LOWER EXTREMITY ANGIOGRAPHY Right 02/24/2023   Procedure: Lower Extremity Angiography;  Surgeon: Shawl Hacker KANDICE, MD;  Location: [facility] INVASIVE CV LAB;  Service: Cardiovascular;  Laterality: Right;  LOWER EXTREMITY INTERVENTION  10/10/2016   Procedure: Lower Extremity Intervention;  Surgeon: Cordella KANDICE Shawl, MD;  Location: [facility] INVASIVE CV LAB;  Service: Cardiovascular;;    PERIPHERAL VASCULAR BALLOON ANGIOPLASTY Left 10/10/2016   Procedure: Peripheral Vascular Balloon Angioplasty;  Surgeon: Cordella KANDICE Shawl, MD;  Location: [facility] INVASIVE CV LAB;  Service: Cardiovascular;  Laterality: Left;   TONSILLECTOMY     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/2022   Procedure: TRANSMETATARSAL AMPUTATION;  Surgeon: Ashley Soulier, DPM;  Location: [facility] ORS;  Service: Podiatry;  Laterality: Right;   WOUND DEBRIDEMENT Right 11/07/2016   Procedure: DEBRIDEMENT OF WOUND AND BONE RIGHT FOOT AND APPLY WOUND VAC;  Surgeon: Soulier Ashley, DPM;  Location: [facility] ORS;  Service: Podiatry;  Laterality: Right;    Allergies: Patient has no known allergies.  Medications: Prior to Admission medications   Medication Sig Start Date End Date Taking? Authorizing Provider  ACCU-CHEK GUIDE test strip Use 1 strip via meter four times a day  to monitor blood glucose 07/10/20   [provider]  Accu-Chek Softclix Lancets lancets  04/29/20   [provider]  acetaminophen  (TYLENOL ) 325 MG tablet Take 650 mg by mouth every 6 (six) hours as needed.    [provider]  aspirin  EC 81 MG tablet Take 81 mg by mouth daily. Swallow whole.    [provider]  atorvastatin  (LIPITOR) 10 MG tablet Take 10 mg by mouth every evening. 06/12/20   [provider]  DROPLET INSULIN  SYRINGE 31G X 5/16 0.3 ML MISC USE FIVE TIMES DAILY AS DIRECTED 06/12/20   [provider]  FEROSUL 325 (65 Fe) MG tablet Take 325 mg by mouth 2 (two) times daily with a meal. 12/26/21   [provider]  glucose blood (ACCU-CHEK AVIVA PLUS) test strip  04/29/20   [provider]  HYDROcodone -acetaminophen  (NORCO/VICODIN) 5-325 MG tablet Take 1 tablet by mouth every 6 (six) hours as needed for up to 5 days for moderate pain (pain score 4-6). 04/16/24 04/21/24  Kingston Mallick, PA-C  insulin  glargine (LANTUS ) 100 UNIT/ML injection Inject 0.15 mLs (15 Units total) into the skin at bedtime.  10/13/16   Sherial Bail, MD  insulin  lispro (HUMALOG) 100 UNIT/ML injection Inject 4 Units into the skin See admin instructions. Inject 4 units SQ with breakfast, inject 4 units SQ with lunch and inject 4 units SQ with dinner    [provider]  Insulin  Syringe-Needle U-100 31G X 5/16 0.3 ML MISC  06/05/20   [provider]  lisinopril  (ZESTRIL ) 5 MG tablet Take 1 tablet (5 mg total) by mouth daily. 01/08/22   Patel, Sona, MD  metFORMIN (GLUCOPHAGE) 1000 MG tablet Take 1,000 mg by mouth 2 (two) times daily with a meal. 06/12/20   [provider]  Multiple Vitamins-Minerals (CENTROVITE) TABS Take 1 tablet by mouth daily. Patient not taking: Reported on 07/03/2023    [provider]  polyethylene glycol (MIRALAX  / GLYCOLAX ) packet Take 17 g by mouth daily.    [provider]  senna (SENOKOT) 8.6 MG TABS tablet Take 1 tablet by mouth 2 (two) times daily. Patient not taking: Reported on 07/03/2023    [provider]  Simethicone  (GAS-X ULTRA STRENGTH PO) Take 1 capsule by mouth 4 (four) times daily as needed (for flatulence).    [provider]     Family History  Problem Relation Age of Onset   Diabetes Mother    Hypertension Mother    Diabetes Father    Hypertension  Father     Social History   Socioeconomic History   Marital status: Single    Spouse name: Not on file   Number of children: Not on file   Years of education: Not on file   Highest education level: Not on file  Occupational History   Not on file  Tobacco Use   Smoking status: Former    Types: Pipe, Cigarettes    Quit date: 05/23/1975    Years since quitting: 48.9   Smokeless tobacco: Never  Vaping Use   Vaping status: Never Used  Substance and Sexual Activity   Alcohol use: No   Drug use: No   Sexual activity: Not on file  Other Topics Concern   Not on file  Social History Narrative   Not on file   Social Drivers of Health   Financial Resource  Strain: Low Risk  (08/28/2023)   Received from [facility]   Overall Financial Resource Strain (CARDIA)    Difficulty of Paying Living Expenses: Not hard at all  Recent Concern: Financial Resource Strain - Medium Risk (08/06/2023)   Received from [facility]   Overall Financial Resource Strain (CARDIA)    Difficulty of Paying Living Expenses: Somewhat hard  Food Insecurity: No Food Insecurity (04/17/2024)   Hunger Vital Sign    Worried About Running Out of Food in the Last Year: Never true    Ran Out of Food in the Last Year: Never true  Transportation Needs: No Transportation Needs (04/17/2024)   PRAPARE - Administrator, Civil Service (Medical): No    Lack of Transportation (Non-Medical): No  Physical Activity: Inactive (08/28/2023)   Received from [facility]   Exercise Vital Sign    On average, how many days per week do you engage in moderate to strenuous exercise (like a brisk walk)?: 0 days    On average, how many minutes do you engage in exercise at this level?: 0 min  Stress: No Stress Concern Present (08/28/2023)   Received from [name] - Occupational Stress Questionnaire    Feeling of Stress : Not at all  Social Connections: Moderately Integrated (04/17/2024)   Social Connection and Isolation Panel    Frequency of Communication with Friends and Family: Three times a week    Frequency of Social Gatherings with Friends and Family: Three times a week    Attends Religious Services: 1 to 4 times per year    Active Member of Clubs or Organizations: Yes    Attends Banker Meetings: 1 to 4 times per year    Marital Status: Never married    Review of Systems: A 12 point ROS discussed and pertinent positives are indicated in the HPI above.  All other systems are negative.  Review of Systems  All other systems reviewed and are  negative.   Vital Signs: BP (!) 115/54 (BP Location: Left Arm)   Pulse (!) 103   Temp 98.3 F (36.8 C)   Resp 18   Ht 5' 11 (1.803 m)   Wt (!) 300 lb 14.9 oz (136.5 kg)   SpO2 92%   BMI 41.97 kg/m   Physical Exam Constitutional:      General: He is not in acute distress.    Appearance: He is obese. He is not ill-appearing.  HENT:     Mouth/Throat:     Mouth: Mucous membranes are moist.  Pharynx: Oropharynx is clear.  Cardiovascular:     Rate and Rhythm: Tachycardia present.  Pulmonary:     Effort: Pulmonary effort is normal.  Abdominal:     Tenderness: There is no abdominal tenderness.  Skin:    General: Skin is warm and dry.  Neurological:     Mental Status: He is alert and oriented to person, place, and time.     Labs:  CBC: Recent Labs    04/16/24 1142 04/16/24 2313 04/17/24 0236 04/19/24 0619  WBC 5.8 6.0 5.4 5.3  HGB 9.9* 8.9* 8.7* 8.4*  HCT 31.1* 27.2* 26.5* 25.4*  PLT 276 225 243 243    COAGS: Recent Labs    04/17/24 0236  INR 1.3*  APTT 35    BMP: Recent Labs    04/16/24 1142 04/16/24 2313 04/17/24 0236 04/19/24 0619  NA 142 137 139 136  K 4.3 4.2 3.8 4.4  CL 105 105 106 101  CO2 26 24 25 25   GLUCOSE 229* 219* 185* 238*  BUN 22 23 21 22   CALCIUM  9.4 8.9 8.9 8.9  CREATININE 0.97 0.92 0.82 1.00  GFRNONAA >60 >60 >60 >60    LIVER FUNCTION TESTS: Recent Labs    04/16/24 1142 04/17/24 0236  BILITOT 0.7 0.9  AST 23 19  ALT 13 10  ALKPHOS 85 72  PROT 7.8 7.3  ALBUMIN 3.5 3.2*    TUMOR MARKERS: No results for input(s): AFPTM, CEA, CA199, CHROMGRNA in the last 8760 hours.  Assessment and Plan:  Widespread osseous lesions concerning for metastatic disease: Madaline FLETCHER Zagal, 73 year old male, is tentatively scheduled 04/20/24 for an image-guided bone lesion biopsy.   Risks and benefits of this procedure were discussed with the patient and/or patient's family including, but not limited to bleeding, infection,  damage to adjacent structures or low yield requiring additional tests.  All of the questions were answered and there is agreement to proceed. He will be NPO at midnight, morning labs ordered. He takes a daily 81 mg aspirin .   Consent signed and in chart.  Thank you for this interesting consult.  I greatly enjoyed meeting KAIROS PANETTA and look forward to participating in their care.  A copy of this report was sent to the requesting provider on this date.  Electronically Signed: Warren Dais, AGACNP-BC 04/19/2024, 3:18 PM   I spent a total of 20 Minutes    in face to face in clinical consultation, greater than 50% of which was counseling/coordinating care for metastatic disease.

## 2024-04-19 NOTE — Plan of Care (Signed)
   Problem: Coping: Goal: Ability to adjust to condition or change in health will improve Outcome: Progressing   Problem: Nutritional: Goal: Maintenance of adequate nutrition will improve Outcome: Progressing

## 2024-04-19 NOTE — Progress Notes (Addendum)
 [facility]  - [place] [facility]   PATIENT NAME: [name]    MR#:  [id number]  DATE OF BIRTH:  31-Jul-1951  SUBJECTIVE:  no family at bedside. Attempting to do PT. Does have some pain. Denies any other complaint    VITALS:  Blood pressure (!) 115/54, pulse (!) 103, temperature 98.3 F (36.8 C), resp. rate 18, height 5' 11 (1.803 m), weight (!) 136.5 kg, SpO2 92%.  PHYSICAL EXAMINATION:   GENERAL:  73 y.o.-year-old patient with no acute distress. Morbid obesity LUNGS: Normal breath sounds bilaterally, no wheezing CARDIOVASCULAR: S1, S2 normal. No murmur   ABDOMEN: Soft, nontender, nondistended. Bowel sounds present.  EXTREMITIES: + edema b/l.    NEUROLOGIC: nonfocal  patient is alert and awake SKIN:  per RN     LABORATORY PANEL:  CBC Recent Labs  Lab 04/19/24 0619  WBC 5.3  HGB 8.4*  HCT 25.4*  PLT 243    Chemistries  Recent Labs  Lab 04/17/24 0236 04/19/24 0619  NA 139 136  K 3.8 4.4  CL 106 101  CO2 25 25  GLUCOSE 185* 238*  BUN 21 22  CREATININE 0.82 1.00  CALCIUM  8.9 8.9  AST 19  --   ALT 10  --   ALKPHOS 72  --   BILITOT 0.9  --    Cardiac Enzymes No results for input(s): TROPONINI in the last 168 hours. RADIOLOGY:  MR PELVIS W WO CONTRAST Result Date: 04/19/2024 CLINICAL DATA:  Bone mass or bone pain, pelvis, aggressive features on xray Right iliac bone fracture on recent CT with possible pathologic features. History of multiple medical problems, including diabetes and chronic renal insufficiency. No given history of malignancy. EXAM: MRI PELVIS WITHOUT AND WITH CONTRAST TECHNIQUE: Multiplanar multisequence MR imaging of the pelvis was performed both before and after administration of intravenous contrast. CONTRAST:  10mL GADAVIST  GADOBUTROL  1 MMOL/ML IV SOLN COMPARISON:  Pelvic CT 04/16/2024. FINDINGS: Technical note: Despite efforts by the technologist and patient, mild to moderate motion artifact is present on today's  exam and could not be eliminated. This reduces exam sensitivity and specificity. Urinary Tract: The visualized distal ureters and bladder appear unremarkable. Bowel: No bowel wall thickening, distention or surrounding inflammation identified within the pelvis. Bowel assessment limited by motion. Vascular/Lymphatic: Multiple enlarged pelvic and inguinal lymph nodes bilaterally. There is a left pelvic sidewall node measuring up to 1.8 cm short axis on image 27/11. Largest right pelvic sidewall node measures 1.6 cm on image 28/11. Inguinal lymph nodes measure up to 1.1 cm short axis on the right (image 35/11). No acute vascular findings are identified. There is mild iliac atherosclerosis. Reproductive: Mild enlargement of the prostate gland with mild heterogeneous central gland enlargement. There is also a 1.9 cm in the posterior peripheral zone near the apex (image 39/11). Other: Retroperitoneal and perirectal edema without significant ascites. Musculoskeletal: As seen on recent pelvic CT, there is a moderately displaced fracture of the right iliac bone. Between the displaced fracture fragments, there is a probable enhancing fluid collection which extends superiorly into the retroperitoneum, measuring approximately 6.1 x 13.5 x 4.6 cm. This likely represents a hematoma and may be slightly enlarged compared with the recent CT. This fracture is likely pathologic as there are innumerable enhancing osseous lesions consistent with widespread metastatic disease or multiple myeloma. Lesions are demonstrated within the lumbar spine, sacrum, both iliac bones, both pubic bones and both proximal femurs. Intra-articular, there are large lesions in both femoral necks and  intertrochanteric regions which are prone to fracture. These measure up to 4.6 cm on the right and 3.0 cm on the left. In addition to the edema and enhancement surrounding the right iliac fracture, there is some edema within the proximal quadriceps musculature  bilaterally. No focal soft tissue masses or other pathologic fractures are identified. IMPRESSION: 1. 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 Large lesions in both femoral necks and intertrochanteric regions are prone to fracture. 2. Displaced pathologic fracture of the right iliac bone with probable slightly enlarged hematoma between the displaced fracture fragments. 3. Pelvic and inguinal adenopathy, likely metastatic. 4. Mild enlargement of the prostate gland with heterogeneous central gland enlargement and a 1.9 cm lesion in the posterior peripheral zone near the apex. Findings could certainly be secondary to metastatic prostate cancer, and the patient's serum PSA level was markedly elevated (1,123) on recent lab analysis. Electronically Signed   By: Elsie Perone M.D.   On: 04/19/2024 10:48    Assessment and Plan  73 y.o. M with chronic right foot ulcer, CKD IIIa, HTN, HLD, MO, DM and anemia who presented after a fall in a [employer] parking lot, initially seen in the ER for hip pain, radiographs normal, discharged, then returned and CT showed a iliac crest fracture.  Ortho consulted, confirmed that this was indeed a nonoperative fracture, patient reported he could not walk and so was kept for SNF placement.  Incidentally, CT was suspicious for pathologic fracture and PSA was markedly elevated.   Iliac crest fracture Suspected pathologic fracture/metastatic disease ? Suspected prostate --Admitted and evaluated by orthopedics Dr. Cleotilde they recommend nonoperative management, workup for possible malignancy given lucency on imaging. -- PSA appears to be markedly elevated >1100 - IR consulted, they recommend MRI pelvis prior to biopsy--scheduled for 04/20/24 -- discussed with Dr. Rennie oncology to see patient. -- MRI pelvis Widespread osseous metastatic disease  or multiple myeloma with innumerable enhancing osseous lesions throughout the lumbar spine, pelvis and both proximal femurs as described. Large lesions in both femoral necks and intertrochanteric regions are prone to fracture. 2. Displaced pathologic fracture of the right iliac bone with probable slightly enlarged hematoma between the displaced fracture fragments. 3. Pelvic and inguinal adenopathy, likely metastatic. 4. Mild enlargement of the prostate gland with heterogeneous central gland enlargement and a 1.9 cm lesion in the posterior peripheral zone near the apex. Findings could certainly be secondary to metastatic prostate cancer, and the patient's serum PSA level was markedly elevated (1,123) on recent lab analysis.  Normocytic anemia/B12 deficiency --Acute blood loss anemia ruled out --Based on labs from last Oct, this appears to be new. -Continue oral iron  and start IM  B12 x3 days and cont po -B-12 132, folate wnl  Chronic right foot ulcer -Continue wound care   Chronic kidney disease stage IIIa --Creatinine stable relative to baseline   Hypertension Hyperlipidemia Peripheral vascular disease BP near normal  -Resume aspirin , lisinopril  - Continue atorvastatin    Morbid obesity BMI 41.9, complicates care, class III obesity   Diabetes Glucose controlled -Hold metformin - Continue glargine - Continue sliding scale corrections          Procedures: Family communication :left VM for sister Inocente standing Consults : orthopedic, oncology CODE STATUS: full DVT Prophylaxis : consider starting after biopsy Level of care: Med-Surg Status is: Inpatient Remains inpatient appropriate because: abnormal MRI. IR to do biopsy tomorrow. Oncology consultation  pending    TOTAL TIME TAKING CARE OF THIS PATIENT: 40 minutes.  >50% time spent on counselling and coordination of care  Note: This dictation was prepared with Dragon dictation along with smaller phrase technology.  Any transcriptional errors that result from this process are unintentional.  Leita Blanch M.D    [facility]   CC: Primary care physician; Adina Buel HERO, MD

## 2024-04-19 NOTE — Plan of Care (Signed)
  Problem: Education: Goal: Ability to describe self-care measures that may prevent or decrease complications (Diabetes Survival Skills Education) will improve Outcome: Progressing Goal: Individualized Educational Video(s) Outcome: Progressing   Problem: Coping: Goal: Ability to adjust to condition or change in health will improve Outcome: Progressing   Problem: Fluid Volume: Goal: Ability to maintain a balanced intake and output will improve Outcome: Progressing   Problem: Health Behavior/Discharge Planning: Goal: Ability to identify and utilize available resources and services will improve Outcome: Progressing Goal: Ability to manage health-related needs will improve Outcome: Progressing   Problem: Metabolic: Goal: Ability to maintain appropriate glucose levels will improve Outcome: Progressing   Problem: Nutritional: Goal: Maintenance of adequate nutrition will improve Outcome: Progressing Goal: Progress toward achieving an optimal weight will improve Outcome: Progressing   Problem: Education: Goal: Knowledge of General Education information will improve Description: Including pain rating scale, medication(s)/side effects and non-pharmacologic comfort measures Outcome: Progressing   Problem: Health Behavior/Discharge Planning: Goal: Ability to manage health-related needs will improve Outcome: Progressing   Problem: Clinical Measurements: Goal: Ability to maintain clinical measurements within normal limits will improve Outcome: Progressing Goal: Will remain free from infection Outcome: Progressing Goal: Diagnostic test results will improve Outcome: Progressing Goal: Respiratory complications will improve Outcome: Progressing Goal: Cardiovascular complication will be avoided Outcome: Progressing   Problem: Nutrition: Goal: Adequate nutrition will be maintained Outcome: Progressing   Problem: Coping: Goal: Level of anxiety will decrease Outcome: Progressing    Problem: Elimination: Goal: Will not experience complications related to bowel motility Outcome: Progressing Goal: Will not experience complications related to urinary retention Outcome: Progressing   Problem: Safety: Goal: Ability to remain free from injury will improve Outcome: Progressing   Problem: Skin Integrity: Goal: Risk for impaired skin integrity will decrease Outcome: Not Progressing   Problem: Tissue Perfusion: Goal: Adequacy of tissue perfusion will improve Outcome: Not Progressing   Problem: Activity: Goal: Risk for activity intolerance will decrease Outcome: Not Progressing   Problem: Pain Managment: Goal: General experience of comfort will improve and/or be controlled Outcome: Not Progressing   Problem: Skin Integrity: Goal: Risk for impaired skin integrity will decrease Outcome: Not Progressing

## 2024-04-19 NOTE — Progress Notes (Signed)
 OT Cancellation Note  Patient Details Name: [name] MRN: [id number] DOB: 10/23/1950   Cancelled Treatment:    Reason Eval/Treat Not Completed: Other (comment). Recent imaging demonstrates concerning results. Pending clarification regarding weightbearing precautions/implications from ortho. Secure message sent to MD Tobie and MD Cleotilde to address concerns. Will follow acutely and re-attempt as appropriate.   Davie Claud R., MPH, MS, OTR/L ascom [phone] 04/19/24, 3:14 PM

## 2024-04-19 NOTE — TOC Initial Note (Signed)
 Transition of Care [facility]) - Initial/Assessment Note    Patient Details  Name: [name] MRN: [id number] Date of Birth: Sep 11, 1950  [facility] CM/SW Contact:    Alvaro Louder, LCSW Phone Number: 04/19/2024, 4:30 PM  Clinical Narrative:        ISRAEL Cowboy out information to SNF's in [place]. LCSWA will present Facilities to patient at the bedside.          TOC to follow for discharge        Patient Goals and CMS Choice            Expected Discharge Plan and Services                                              Prior Living Arrangements/Services                       Activities of Daily Living   ADL Screening (condition at time of admission) Independently performs ADLs?: No Does the patient have a NEW difficulty with bathing/dressing/toileting/self-feeding that is expected to last >3 days?: Yes (Initiates electronic notice to provider for possible OT consult) Does the patient have a NEW difficulty with getting in/out of bed, walking, or climbing stairs that is expected to last >3 days?: Yes (Initiates electronic notice to provider for possible PT consult) Does the patient have a NEW difficulty with communication that is expected to last >3 days?: No Is the patient deaf or have difficulty hearing?: No Does the patient have difficulty seeing, even when wearing glasses/contacts?: No Does the patient have difficulty concentrating, remembering, or making decisions?: No  Permission Sought/Granted                  Emotional Assessment              Admission diagnosis:  Hip fracture (HCC) [S72.009A] Fall from standing, initial encounter [W19.XXXA] Closed displaced fracture of pelvis, unspecified part of pelvis, initial encounter (HCC) [S32.9XXA] Patient Active Problem List   Diagnosis Date Noted   B12 deficiency 04/19/2024   Hip fracture (HCC) 04/17/2024   Pathological fracture 04/17/2024   Secondary hypercoagulable  state (HCC) 04/17/2024   Hematoma 04/17/2024   Acute blood loss anemia 04/17/2024   Open wound of right foot 04/17/2024   CKD stage 3a, GFR 45-59 ml/min (HCC) 04/17/2024   Encounter for screening colonoscopy 08/20/2023   Polyp of transverse colon 08/20/2023   Polyp of ascending colon 08/20/2023   Diabetic infection of right foot (HCC) 02/19/2023   Atherosclerosis of native arteries of the extremities with ulceration (HCC) 07/14/2022   Diabetic foot infection (HCC) 01/03/2022   Hyperkalemia 01/03/2022   PAD (peripheral artery disease) (HCC)    HTN (hypertension)    Type II diabetes mellitus with renal manifestations (HCC)    Acute renal failure superimposed on stage 3a chronic kidney disease (HCC)    Iron  deficiency anemia    Diabetes 1.5, 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   Lymphedema 01/11/2018   Chronic venous insufficiency 01/11/2018   Venous ulcer (HCC) 09/28/2017   Ulcer of foot with necrosis of bone (HCC) 10/30/2016   DM (diabetes mellitus), type 2 (HCC) 10/30/2016   Hyperlipidemia 10/30/2016   Chronic kidney disease (CKD) stage G1/A3, glomerular filtration rate (GFR) equal to or greater than 90 mL/min/1.73 square meter and albuminuria creatinine  ratio greater than 300 mg/g 10/27/2016   Pressure injury of skin 10/04/2016   Septic shock (HCC) 10/03/2016   Erectile dysfunction 07/20/2014   Obesity 12/18/2010   Hypertension 02/19/2007   PCP:  Adina Buel HERO, MD Pharmacy:   [facility] - [place], [place] - [address] [place] [address] Phone: [phone] Fax: [phone]  [facility] [address], [place] - [address] [place] [address] Phone: [phone] Fax: [phone]     Social Drivers of Health (SDOH) Social History: SDOH Screenings   Food Insecurity: No Food Insecurity (04/17/2024)  Housing: Low Risk  (04/17/2024)  Transportation Needs: No Transportation Needs (04/17/2024)  Utilities: Not At Risk  (04/17/2024)  Financial Resource Strain: Low Risk  (08/28/2023)   Received from [facility]  Recent Concern: Financial Resource Strain - Medium Risk (08/06/2023)   Received from [facility]  Physical Activity: Inactive (08/28/2023)   Received from [facility]  Social Connections: Moderately Integrated (04/17/2024)  Stress: No Stress Concern Present (08/28/2023)   Received from [facility]  Tobacco Use: Medium Risk (04/16/2024)  Health Literacy: Adequate Health Literacy (08/28/2023)   Received from [facility]   SDOH Interventions:     Readmission Risk Interventions     No data to display

## 2024-04-20 ENCOUNTER — Inpatient Hospital Stay

## 2024-04-20 DIAGNOSIS — S72009A Fracture of unspecified part of neck of unspecified femur, initial encounter for closed fracture: Secondary | ICD-10-CM | POA: Diagnosis not present

## 2024-04-20 DIAGNOSIS — C61 Malignant neoplasm of prostate: Secondary | ICD-10-CM | POA: Diagnosis not present

## 2024-04-20 DIAGNOSIS — C7951 Secondary malignant neoplasm of bone: Secondary | ICD-10-CM

## 2024-04-20 LAB — CBC
HCT: 24.7 % — ABNORMAL LOW (ref 39.0–52.0)
Hemoglobin: 7.9 g/dL — ABNORMAL LOW (ref 13.0–17.0)
MCH: 28.7 pg (ref 26.0–34.0)
MCHC: 32 g/dL (ref 30.0–36.0)
MCV: 89.8 fL (ref 80.0–100.0)
Platelets: 274 K/uL (ref 150–400)
RBC: 2.75 MIL/uL — ABNORMAL LOW (ref 4.22–5.81)
RDW: 14.6 % (ref 11.5–15.5)
WBC: 4.7 K/uL (ref 4.0–10.5)
nRBC: 0.4 % — ABNORMAL HIGH (ref 0.0–0.2)

## 2024-04-20 LAB — PROTIME-INR
INR: 1.2 (ref 0.8–1.2)
Prothrombin Time: 15.9 s — ABNORMAL HIGH (ref 11.4–15.2)

## 2024-04-20 LAB — PROTEIN ELECTROPHORESIS, SERUM
A/G Ratio: 0.8 (ref 0.7–1.7)
Albumin ELP: 2.7 g/dL — ABNORMAL LOW (ref 2.9–4.4)
Alpha-1-Globulin: 0.4 g/dL (ref 0.0–0.4)
Alpha-2-Globulin: 1 g/dL (ref 0.4–1.0)
Beta Globulin: 0.9 g/dL (ref 0.7–1.3)
Gamma Globulin: 1.3 g/dL (ref 0.4–1.8)
Globulin, Total: 3.6 g/dL (ref 2.2–3.9)
Total Protein ELP: 6.3 g/dL (ref 6.0–8.5)

## 2024-04-20 LAB — GLUCOSE, CAPILLARY
Glucose-Capillary: 183 mg/dL — ABNORMAL HIGH (ref 70–99)
Glucose-Capillary: 159 mg/dL — ABNORMAL HIGH (ref 70–99)
Glucose-Capillary: 182 mg/dL — ABNORMAL HIGH (ref 70–99)
Glucose-Capillary: 249 mg/dL — ABNORMAL HIGH (ref 70–99)

## 2024-04-20 MED ORDER — MIDAZOLAM HCL 2 MG/2ML IJ SOLN
INTRAMUSCULAR | Status: AC
Start: 2024-04-20 — End: 2024-04-21
  Filled 2024-04-20: qty 2

## 2024-04-20 MED ORDER — SODIUM CHLORIDE 0.9 % IV SOLN
300.0000 mg | INTRAVENOUS | Status: DC
  Administered 2024-04-20: 300 mg via INTRAVENOUS
  Filled 2024-04-20: qty 300
  Filled 2024-04-20: qty 15

## 2024-04-20 MED ORDER — FENTANYL CITRATE (PF) 100 MCG/2ML IJ SOLN
INTRAMUSCULAR | Status: AC
  Filled 2024-04-20: qty 2

## 2024-04-20 MED ORDER — INSULIN ASPART 100 UNIT/ML IJ SOLN
0.0000 [IU] | Freq: Every day | INTRAMUSCULAR | Status: DC
  Administered 2024-04-20: 2 [IU] via SUBCUTANEOUS
  Administered 2024-04-21: 4 [IU] via SUBCUTANEOUS
  Filled 2024-04-20 (×2): qty 1

## 2024-04-20 MED ORDER — INSULIN ASPART 100 UNIT/ML IJ SOLN
0.0000 [IU] | Freq: Three times a day (TID) | INTRAMUSCULAR | Status: DC
  Administered 2024-04-20 – 2024-04-21 (×2): 3 [IU] via SUBCUTANEOUS
  Administered 2024-04-21 (×2): 5 [IU] via SUBCUTANEOUS
  Administered 2024-04-22 – 2024-04-23 (×4): 3 [IU] via SUBCUTANEOUS
  Filled 2024-04-20 (×6): qty 1

## 2024-04-20 MED ORDER — FENTANYL CITRATE (PF) 100 MCG/2ML IJ SOLN
INTRAMUSCULAR | Status: AC | PRN
  Administered 2024-04-20: 50 ug via INTRAVENOUS

## 2024-04-20 NOTE — Progress Notes (Signed)
 PROGRESS NOTE    [name]  FMW:[id number] DOB: 1950/12/22 DOA: 04/16/2024 PCP: Adina Buel HERO, MD    Brief Narrative:   73 y.o. M with chronic right foot ulcer, CKD IIIa, HTN, HLD, MO, DM and anemia who presented after a fall in a [employer] parking lot, initially seen in the ER for hip pain, radiographs normal, discharged, then returned and CT showed a iliac crest fracture.  Ortho consulted, confirmed that this was indeed a nonoperative fracture, patient reported he could not walk and so was kept for SNF placement.  Incidentally, CT was suspicious for pathologic fracture and PSA was markedly elevated.   Assessment & Plan:   Principal Problem:   Hip fracture (HCC) Active Problems:   Pathological fracture   Secondary hypercoagulable state (HCC)   Acute blood loss anemia   DM (diabetes mellitus), type 2 (HCC)   Obesity   PAD (peripheral artery disease) (HCC)   HTN (hypertension)   Hematoma   Open wound of right foot   CKD stage 3a, GFR 45-59 ml/min (HCC)   B12 deficiency   Prostate cancer metastatic to bone [facility])  Iliac crest fracture Suspected pathologic fracture Admitted and evaluated by orthopedics.  They recommend nonoperative management, workup for possible malignancy given lucency on imaging. Plan: Nonoperative management for iliac crest fracture.  High suspicion for metastatic prostate carcinoma with osseous metastasis.  IR consulted and will perform image guided biopsy today.  Oncology consulted.  Case discussed with Dr. Rennie   Normocytic anemia Acute blood loss anemia ruled out Based on labs from last Oct, this appears to be new B12 low Plan: Daily B12 Daily oral iron   Chronic right foot ulcer Local wound care   Chronic kidney disease stage IIIa Stable   Hypertension Hyperlipidemia Peripheral vascular disease BP near normal  Continue aspirin , lisinopril , atorvastatin    Morbid obesity BMI 41.9.  Complicates overall care and prognosis.    Diabetes Glucose controlled Hold home metformin.   Glargine 15 units nightly NovoLog  3 units 3 times daily with meals Moderate sliding scale and nightly coverage   DVT prophylaxis: SCDs Code Status: Full Family Communication: Family member at bedside 8/20 Disposition Plan: Status is: Inpatient Remains inpatient appropriate because: Pending biopsy.  Anticipate medical readiness for discharge 8/21.  Will need skilled nursing facility.   Level of care: Med-Surg  Consultants:  IR Medical oncology  Procedures:  CT-guided biopsy  Antimicrobials: None   Subjective: Seen and examined peer resting in bed.  No visible distress.  Answer questions appropriately.  Objective: Vitals:   04/20/24 1405 04/20/24 1427 04/20/24 1431 04/20/24 1440  BP: (!) 149/55 (!) 149/63 (!) 120/105 132/74  Pulse: 85 86 87 86  Resp: 14 14 14 14   Temp:    98.4 F (36.9 C)  TempSrc:      SpO2: 95% 97% 96% 98%  Weight:      Height:        Intake/Output Summary (Last 24 hours) at 04/20/2024 1449 Last data filed at 04/20/2024 0542 Gross per 24 hour  Intake --  Output 550 ml  Net -550 ml   Filed Weights   04/16/24 1914 04/17/24 0124  Weight: 129.3 kg (!) 136.5 kg    Examination:  General exam: Appears calm and comfortable  Respiratory system: Clear to auscultation. Respiratory effort normal. Cardiovascular system: S1 S2, RRR, no murmurs, no pedal edema Gastrointestinal system: Soft, NT/ND, normal bowel sounds Central nervous system: Alert and oriented. No focal neurological deficits. Extremities: Pain bilateral  lower extremities.  Gait not assessed.  Decreased ROM. Skin: No rashes, lesions or ulcers Psychiatry: Judgement and insight appear normal. Mood & affect appropriate.     Data Reviewed: I have personally reviewed following labs and imaging studies  CBC: Recent Labs  Lab 04/16/24 1142 04/16/24 2313 04/17/24 0236 04/19/24 0619 04/20/24 0532  WBC 5.8 6.0 5.4 5.3 4.7   NEUTROABS 4.2 4.5  --   --   --   HGB 9.9* 8.9* 8.7* 8.4* 7.9*  HCT 31.1* 27.2* 26.5* 25.4* 24.7*  MCV 89.9 90.1 88.3 88.2 89.8  PLT 276 225 243 243 274   Basic Metabolic Panel: Recent Labs  Lab 04/16/24 1142 04/16/24 2313 04/17/24 0236 04/19/24 0619  NA 142 137 139 136  K 4.3 4.2 3.8 4.4  CL 105 105 106 101  CO2 26 24 25 25   GLUCOSE 229* 219* 185* 238*  BUN 22 23 21 22   CREATININE 0.97 0.92 0.82 1.00  CALCIUM  9.4 8.9 8.9 8.9   GFR: Estimated Creatinine Clearance: 92.9 mL/min (by C-G formula based on SCr of 1 mg/dL). Liver Function Tests: Recent Labs  Lab 04/16/24 1142 04/17/24 0236  AST 23 19  ALT 13 10  ALKPHOS 85 72  BILITOT 0.7 0.9  PROT 7.8 7.3  ALBUMIN 3.5 3.2*   No results for input(s): LIPASE, AMYLASE in the last 168 hours. No results for input(s): AMMONIA in the last 168 hours. Coagulation Profile: Recent Labs  Lab 04/17/24 0236 04/20/24 0532  INR 1.3* 1.2   Cardiac Enzymes: No results for input(s): CKTOTAL, CKMB, CKMBINDEX, TROPONINI in the last 168 hours. BNP (last 3 results) No results for input(s): PROBNP in the last 8760 hours. HbA1C: No results for input(s): HGBA1C in the last 72 hours. CBG: Recent Labs  Lab 04/19/24 1159 04/19/24 1631 04/19/24 2115 04/20/24 0809 04/20/24 1201  GLUCAP 221* 230* 230* 183* 159*   Lipid Profile: No results for input(s): CHOL, HDL, LDLCALC, TRIG, CHOLHDL, LDLDIRECT in the last 72 hours. Thyroid Function Tests: No results for input(s): TSH, T4TOTAL, FREET4, T3FREE, THYROIDAB in the last 72 hours. Anemia Panel: Recent Labs    04/19/24 0619  VITAMINB12 132*  FOLATE 12.1  FERRITIN 482*  TIBC 200*  IRON  31*   Sepsis Labs: No results for input(s): PROCALCITON, LATICACIDVEN in the last 168 hours.  No results found for this or any previous visit (from the past 240 hours).       Radiology Studies: MR PELVIS W WO CONTRAST Result Date:  04/19/2024 CLINICAL DATA:  Bone mass or bone pain, pelvis, aggressive features on xray Right iliac bone fracture on recent CT with possible pathologic features. History of multiple medical problems, including diabetes and chronic renal insufficiency. No given history of malignancy. EXAM: MRI PELVIS WITHOUT AND WITH CONTRAST TECHNIQUE: Multiplanar multisequence MR imaging of the pelvis was performed both before and after administration of intravenous contrast. CONTRAST:  10mL GADAVIST  GADOBUTROL  1 MMOL/ML IV SOLN COMPARISON:  Pelvic CT 04/16/2024. FINDINGS: Technical note: Despite efforts by the technologist and patient, mild to moderate motion artifact is present on today's exam and could not be eliminated. This reduces exam sensitivity and specificity. Urinary Tract: The visualized distal ureters and bladder appear unremarkable. Bowel: No bowel wall thickening, distention or surrounding inflammation identified within the pelvis. Bowel assessment limited by motion. Vascular/Lymphatic: Multiple enlarged pelvic and inguinal lymph nodes bilaterally. There is a left pelvic sidewall node measuring up to 1.8 cm short axis on image 27/11. Largest right pelvic sidewall node measures  1.6 cm on image 28/11. Inguinal lymph nodes measure up to 1.1 cm short axis on the right (image 35/11). No acute vascular findings are identified. There is mild iliac atherosclerosis. Reproductive: Mild enlargement of the prostate gland with mild heterogeneous central gland enlargement. There is also a 1.9 cm in the posterior peripheral zone near the apex (image 39/11). Other: Retroperitoneal and perirectal edema without significant ascites. Musculoskeletal: As seen on recent pelvic CT, there is a moderately displaced fracture of the right iliac bone. Between the displaced fracture fragments, there is a probable enhancing fluid collection which extends superiorly into the retroperitoneum, measuring approximately 6.1 x 13.5 x 4.6 cm. This likely  represents a hematoma and may be slightly enlarged compared with the recent CT. This fracture is likely pathologic as there are innumerable enhancing osseous lesions consistent with widespread metastatic disease or multiple myeloma. Lesions are demonstrated within the lumbar spine, sacrum, both iliac bones, both pubic bones and both proximal femurs. Intra-articular, there are large lesions in both femoral necks and intertrochanteric regions which are prone to fracture. These measure up to 4.6 cm on the right and 3.0 cm on the left. In addition to the edema and enhancement surrounding the right iliac fracture, there is some edema within the proximal quadriceps musculature bilaterally. No focal soft tissue masses or other pathologic fractures are identified. IMPRESSION: 1. 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 Large lesions in both femoral necks and intertrochanteric regions are prone to fracture. 2. Displaced pathologic fracture of the right iliac bone with probable slightly enlarged hematoma between the displaced fracture fragments. 3. Pelvic and inguinal adenopathy, likely metastatic. 4. Mild enlargement of the prostate gland with heterogeneous central gland enlargement and a 1.9 cm lesion in the posterior peripheral zone near the apex. Findings could certainly be secondary to metastatic prostate cancer, and the patient's serum PSA level was markedly elevated (1,123) on recent lab analysis. Electronically Signed   By: Elsie Perone M.D.   On: 04/19/2024 10:48        Scheduled Meds:  aspirin  EC  81 mg Oral Daily   atorvastatin   10 mg Oral QPM   cyanocobalamin   1,000 mcg Intramuscular Daily   vitamin B-12  1,000 mcg Oral Daily   dextromethorphan -guaiFENesin   1 tablet Oral BID   fentaNYL        ferrous sulfate   325 mg Oral BID WC   insulin  aspart  0-15 Units  Subcutaneous TID WC   insulin  aspart  0-5 Units Subcutaneous QHS   insulin  aspart  3 Units Subcutaneous TID WC   insulin  glargine  15 Units Subcutaneous QHS   leptospermum manuka honey  1 Application Topical Daily   lisinopril   5 mg Oral Daily   midazolam        pneumococcal 20-valent conjugate vaccine  0.5 mL Intramuscular Tomorrow-1000   Continuous Infusions:  iron  sucrose       LOS: 3 days    Calvin KATHEE Robson, MD [facility]   If 7PM-7AM, please contact night-coverage  04/20/2024, 2:49 PM

## 2024-04-20 NOTE — Progress Notes (Signed)
 PT Cancellation Note  Patient Details Name: [name] MRN: [id number] DOB: 07-Nov-1950   Cancelled Treatment:    Reason Eval/Treat Not Completed: Patient at procedure or test/unavailable Pt out of room for imaging, will maintain on caseload and attempt to see as available and appropriate.    [name], DPT 04/20/2024, 2:07 PM

## 2024-04-20 NOTE — Progress Notes (Signed)
 Occupational Therapy Treatment Patient Details Name: [name] MRN: [id number] DOB: [birth year] Today's Date: 04/20/2024   History of present illness Pt is a 73 y.o. male presenting to hospital 04/16/24 s/p fall at grocery store.  Imaging showing acute comminuted and displaced fx involving R iliac wing with displaced iliac crest fragment; small volume R retroperitoneal and pelvic hematoma.  Pt admitted with acute comminuted and displaced fx involving  the R iliac wing with displaced iliac crest fragment, concern for pathological source of fx, secondary hypercoagulable state, acute blood loss anemia, open wound of R foot, hematoma.  Per ortho, plan for conservative treatment.  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   OT comments  [name] was seen for OT treatment on this date. Upon arrival to room pt supine in bed, agreeable to OT Tx session. OT facilitated limited ADL management with education and assistance as described below. See ADL section for additional details regarding occupational performance. Session concluded as hospital transport staff arrived to take pt to CT. Pt continues to be functionally limited by generalized weakness, decreased activity tolerance, and increased pain with mobility. Pt return verbalizes understanding of education provided t/o session. Pt is progressing toward OT goals and continues to benefit from skilled OT services to maximize return to PLOF and minimize risk of future falls, injury, and readmission. Will continue to follow POC as written. Discharge recommendation remains appropriate.        If plan is discharge home, recommend the following:  Two people to help with walking and/or transfers;A lot of help with bathing/dressing/bathroom;Assist for transportation;Assistance with cooking/housework;Help with stairs or ramp for entrance   Equipment  Recommendations  Other (comment) (Defer)    Recommendations for Other Services      Precautions / Restrictions Precautions Precautions: Fall Recall of Precautions/Restrictions: Impaired Restrictions Weight Bearing Restrictions Per Provider Order: Yes RLE Weight Bearing Per Provider Order: Weight bearing as tolerated Other Position/Activity Restrictions: WBAT R LE per secure message with MD Cleotilde and MD Jonel 04/18/24.  Post op shoe R LE; awaiting further clarification 2/2 MR pelivic imaging on 8/19.       Mobility Bed Mobility Overal bed mobility: Needs Assistance Bed Mobility: Rolling Rolling: Max assist, Used rails         General bed mobility comments: Educated on log-roll technique.    Transfers                   General transfer comment: NT, hospital transport in room to take pt to CT prior to further mobility attempts.     Balance Overall balance assessment: Needs assistance                                         ADL either performed or assessed with clinical judgement   ADL Overall ADL's : Needs assistance/impaired                                     Functional mobility during ADLs: Maximal assistance;+2 for physical assistance General ADL Comments: Session limited 2/2 hospital transport arriving to take pt to CT. OT initiated bed mobility and educated on log-roll for transfer technique, but was unable to complete beyond  initial shifting toward EOB and rolling onto L side. Pt requires MAX A to advance BLE toward EOB and rolling.    Extremity/Trunk Assessment Upper Extremity Assessment Upper Extremity Assessment: Generalized weakness   Lower Extremity Assessment Lower Extremity Assessment: Generalized weakness   Cervical / Trunk Assessment Cervical / Trunk Assessment: Other exceptions Cervical / Trunk Exceptions: forward head/shoulders    Vision       Perception     Praxis     Communication  Communication Communication: No apparent difficulties   Cognition Arousal: Alert Behavior During Therapy: WFL for tasks assessed/performed Cognition: No family/caregiver present to determine baseline                               Following commands: Impaired Following commands impaired: Follows multi-step commands with increased time, Follows multi-step commands inconsistently, Only follows one step commands consistently      Cueing   Cueing Techniques: Verbal cues, Gestural cues, Tactile cues, Visual cues  Exercises Other Exercises Other Exercises: Pt educated on role of OT in acute setting, bed mobility techniques, and importance of regular functional activity during hospital stay. Pt very motivated and eager to do what he can. Would benefit from futher reinfrocement/review.    Shoulder Instructions       General Comments      Pertinent Vitals/ Pain       Pain Assessment Pain Assessment: Faces Faces Pain Scale: Hurts even more Pain Location: R side/hip/glutes Pain Descriptors / Indicators: Aching, Sore, Grimacing Pain Intervention(s): Limited activity within patient's tolerance, Monitored during session, Repositioned  Home Living                                          Prior Functioning/Environment              Frequency  Min 2X/week        Progress Toward Goals  OT Goals(current goals can now be found in the care plan section)  Progress towards OT goals: Progressing toward goals  Acute Rehab OT Goals Patient Stated Goal: to get better and go home OT Goal Formulation: With patient Time For Goal Achievement: 05/02/24 Potential to Achieve Goals: Good  Plan      Co-evaluation                 AM-PAC OT 6 Clicks Daily Activity     Outcome Measure   Help from another person eating meals?: None Help from another person taking care of personal grooming?: A Little Help from another person toileting, which includes  using toliet, bedpan, or urinal?: A Lot Help from another person bathing (including washing, rinsing, drying)?: A Lot Help from another person to put on and taking off regular upper body clothing?: A Little Help from another person to put on and taking off regular lower body clothing?: A Lot 6 Click Score: 16    End of Session    OT Visit Diagnosis: Other abnormalities of gait and mobility (R26.89);Muscle weakness (generalized) (M62.81);History of falling (Z91.81);Pain Pain - Right/Left: Right Pain - part of body: Hip   Activity Tolerance Patient tolerated treatment well;Other (comment)   Patient Left in bed;with call bell/phone within reach;Other (comment) (With hospital staff preparing to take pt down for CT)   Nurse Communication          Time: [phone]  OT Time Calculation (min): 8 min  Charges: OT General Charges $OT Visit: 1 Visit OT Treatments $Self Care/Home Management : 8-22 mins  [name], M.S., OTR/L 04/20/24, 2:35 PM

## 2024-04-20 NOTE — Progress Notes (Signed)
 Patient clinically stable post Ct Bone lesion biopsy per Dr Karalee, tolerated well. Only local with Fentanyl  50 mcg IV given for procedure. Vitals stable pre and post procedure. Report given to [facility] Rn/156 post procedure. Patient awake/alert and oriented post procedure.

## 2024-04-20 NOTE — Progress Notes (Signed)
 Physical Therapy Treatment Patient Details Name: [name] MRN: [id number] DOB: December 09, 1950 Today's Date: 04/20/2024   History of Present Illness Pt is a 73 y.o. male presenting to hospital 04/16/24 s/p fall at grocery store.  Imaging showing acute comminuted and displaced fx involving R iliac wing with displaced iliac crest fragment; small volume R retroperitoneal and pelvic hematoma.  Pt admitted with acute comminuted and displaced fx involving  the R iliac wing with displaced iliac crest fragment, concern for pathological source of fx, secondary hypercoagulable state, acute blood loss anemia, open wound of R foot, hematoma.  Per ortho, plan for conservative treatment.  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    PT Comments  Pt pleasant and motivated, but ultimately very functionally limited 2/2 weakness, pain.  He reports he has been essentially w/c bound for 8 years, but in that time regularly walks a few feet out of the home, down the steps and until recently was able to do a little driving.  Pt with very limited tolerance for R hip movement today (P, AA, AROM) and displays b/l lack of terminal knee extension, R>L.   Overall pt showed good effort with supine LE exercises and attempt to roll in bed but all were pain and fatigue limited.  Pt with DOE with modest mobility efforts with SpO2 dropping to the 80s.  Pt will benefit from ongoing PT, continue with POC.    If plan is discharge home, recommend the following: Two people to help with walking and/or transfers;A lot of help with bathing/dressing/bathroom;Assistance with cooking/housework;Assist for transportation;Help with stairs or ramp for entrance   Can travel by private vehicle     No  Equipment Recommendations  Other (comment)    Recommendations for Other Services       Precautions / Restrictions  Precautions Precautions: Fall Recall of Precautions/Restrictions: Impaired Precaution/Restrictions Comments: Displaced pathologic fracture of the right iliac bone, b/l intertrocanteric metistatic lesions prone to fracture - officially no mobility restrictions Restrictions Weight Bearing Restrictions Per Provider Order: Yes RLE Weight Bearing Per Provider Order: Weight bearing as tolerated Other Position/Activity Restrictions: WBAT R LE per secure message with MD Cleotilde and MD Jonel 04/18/24.  Post op shoe R LE; awaiting further clarification 2/2 MR pelivic imaging on 8/19.     Mobility  Bed Mobility Overal bed mobility: Needs Assistance Bed Mobility: Rolling Rolling: Max assist, Used rails         General bed mobility comments: attempted scooting LEs toward EOB and trying to roll to the L.  Pt with increased R hip pain and very much struggling to use it to help roll - deferred further mobility to focus on LE ROM and strengthening.  Pt's O2 down to 89% with significant DOE just trying to turn in bed    Transfers                        Ambulation/Gait                   Stairs             Wheelchair Mobility     Tilt Bed    Modified Rankin (Stroke Patients Only)       Balance  Communication Communication Communication: No apparent difficulties  Cognition Arousal: Alert Behavior During Therapy: WFL for tasks assessed/performed                             Following commands: Impaired Following commands impaired: Only follows one step commands consistently    Cueing Cueing Techniques: Verbal cues, Gestural cues, Tactile cues, Visual cues  Exercises General Exercises - Lower Extremity Ankle Circles/Pumps: AROM, 10 reps Quad Sets: Strengthening, 10 reps (pt lacks TKE b/l) Short Arc Quad: AAROM, AROM (able to give some AROM on L, PROM only on R) Heel Slides: AAROM, 10  reps (lightly resisted leg ext, poor tolerance on R) Hip ABduction/ADduction: AAROM, AROM, Strengthening (strengthening on L, AAROM on R) Straight Leg Raises: AROM, Left, 10 reps (unable to tolerate on R) Other Exercises Other Exercises: educated on LE positioning, TKE issues/ramifications    General Comments        Pertinent Vitals/Pain Pain Assessment Pain Assessment: 0-10 Pain Score: 6  Pain Location: R hip Pain Intervention(s): Limited activity within patient's tolerance    Home Living                          Prior Function            PT Goals (current goals can now be found in the care plan section) Acute Rehab PT Goals PT Goal Formulation: With patient Time For Goal Achievement: 05/03/24 Potential to Achieve Goals: Fair Progress towards PT goals:  (slow progress)    Frequency    Min 2X/week      PT Plan      Co-evaluation              AM-PAC PT 6 Clicks Mobility   Outcome Measure  Help needed turning from your back to your side while in a flat bed without using bedrails?: A Lot Help needed moving from lying on your back to sitting on the side of a flat bed without using bedrails?: A Lot Help needed moving to and from a bed to a chair (including a wheelchair)?: Total Help needed standing up from a chair using your arms (e.g., wheelchair or bedside chair)?: Total Help needed to walk in hospital room?: Total Help needed climbing 3-5 steps with a railing? : Total 6 Click Score: 8    End of Session Equipment Utilized During Treatment: Gait belt Activity Tolerance: Patient limited by fatigue Patient left: in bed;with call bell/phone within reach;with bed alarm set;with nursing/sitter in room;Other (comment) Nurse Communication: Mobility status;Precautions;Weight bearing status;Other (comment) PT Visit Diagnosis: Other abnormalities of gait and mobility (R26.89);Muscle weakness (generalized) (M62.81);History of falling (Z91.81);Pain Pain -  Right/Left: Right Pain - part of body: Hip     Time: 8445-8380 PT Time Calculation (min) (ACUTE ONLY): 25 min  Charges:    $Therapeutic Exercise: 8-22 mins $Therapeutic Activity: 8-22 mins PT General Charges $$ ACUTE PT VISIT: 1 Visit                     [name], DPT 04/20/2024, 5:10 PM

## 2024-04-20 NOTE — Procedures (Signed)
 Interventional Radiology Procedure Note  Procedure: Successful CT guided core biopsy of left iliac bone  Complications: None  Estimated Blood Loss: None  Recommendations: - Path sent   Signed,  Wilkie LOIS Lent, MD

## 2024-04-20 NOTE — Consult Note (Signed)
 [facility] CONSULT NOTE  Patient Care Team: Adina Buel HERO, MD as PCP - General (Family Medicine)  CHIEF COMPLAINTS/PURPOSE OF CONSULTATION: prostate cancer  Oncology History   No history exists.    HISTORY OF PRESENTING ILLNESS: Patient in bed. e/Accompanied by family- nephew  [name] 73 y.o.  male pleasant patient with multiple medical problems including  type 2 diabetes mellitus, iron  deficiency anemia, peripheral arterial disease and DVT in 2018 not on chronic anticoagulation, chronic kidney disease stage IIIa is currently admitted to hospital for recent fall.  On further evaluation patient noted to have elevated PSA > 1000; multiple bony lesions in the pelvis and enlarged prostate concerning for metastatic prostate cancer.   Orthopedics has evaluated the patient recommend conservative measures.  Patient is currently pending biopsy of his pelvic mass.  Patient denies any blood in stools denies any black-colored stool.  However does admit to increased frequency of urination and his density.  No blood in urine.   He does admit to increased pain in his bones overall.  At baseline he goes around in a wheelchair given his obesity and arthritis etc.  Review of Systems  Constitutional:  Positive for malaise/fatigue. Negative for chills, diaphoresis, fever and weight loss.  HENT:  Negative for nosebleeds and sore throat.   Eyes:  Negative for double vision.  Respiratory:  Positive for shortness of breath. Negative for cough, hemoptysis, sputum production and wheezing.   Cardiovascular:  Negative for chest pain, palpitations, orthopnea and leg swelling.  Gastrointestinal:  Positive for constipation. Negative for abdominal pain, blood in stool, diarrhea, heartburn, melena, nausea and vomiting.  Genitourinary:  Negative for dysuria, frequency and urgency.  Musculoskeletal:  Positive for back pain and joint pain.  Skin: Negative.  Negative for itching and rash.   Neurological:  Positive for weakness. Negative for dizziness, tingling, focal weakness and headaches.  Endo/Heme/Allergies:  Does not bruise/bleed easily.  Psychiatric/Behavioral:  Negative for depression. The patient is not nervous/anxious and does not have insomnia.     MEDICAL HISTORY:  Past Medical History:  Diagnosis Date   Arthritis    Bladder incontinence    Chronic kidney disease    Stage 3 Kidney disease   Diabetes mellitus without complication (HCC)    DVT of leg (deep venous thrombosis) (HCC)    H/O RIGHT LEG 2018   Dyspnea    occassional   Hyperlipidemia    Hypertension    Peripheral vascular disease (HCC)    Sepsis (HCC)     SURGICAL HISTORY: Past Surgical History:  Procedure Laterality Date   ABDOMINAL AORTOGRAM W/LOWER EXTREMITY N/A 10/10/2016   Procedure: Abdominal Aortogram w/Lower Extremity;  Surgeon: Cordella KANDICE Shawl, MD;  Location: [facility];  Service: Cardiovascular;  Laterality: N/A;   CATARACT EXTRACTION W/PHACO Left 05/27/2018   Procedure: CATARACT EXTRACTION PHACO AND INTRAOCULAR LENS PLACEMENT (IOC);  Surgeon: Myrna Adine Anes, MD;  Location: [facility];  Service: Ophthalmology;  Laterality: Left;  US  03:06.2 AP% 16.1 CDE 30.16 FLUID PACK LOT @ [id number] H   CATARACT EXTRACTION W/PHACO Right 07/13/2023   Procedure: CATARACT EXTRACTION PHACO AND INTRAOCULAR LENS PLACEMENT (IOC) RIGHT DIABETIC 4.00 00:36.6;  Surgeon: Myrna Adine Anes, MD;  Location: [facility];  Service: Ophthalmology;  Laterality: Right;   CIRCUMCISION  1998   COLONOSCOPY WITH PROPOFOL  N/A 08/20/2023   Procedure: COLONOSCOPY WITH PROPOFOL ;  Surgeon: Unk Corinn Skiff, MD;  Location: [facility];  Service: Gastroenterology;  Laterality: N/A;   EYE SURGERY Left  Retina Detachment   GRAFT APPLICATION Right 05/29/2017   Procedure: GRAFT APPLICATION ( RIGHT FOOT );  Surgeon: Jama Cordella MATSU, MD;  Location: [facility];  Service: Vascular;  Laterality: Right;  graft  taken from patients right thigh   IRRIGATION AND DEBRIDEMENT FOOT Right 10/03/2016   Procedure: IRRIGATION AND DEBRIDEMENT FOOT;  Surgeon: Eva Gay, DPM;  Location: [facility];  Service: Podiatry;  Laterality: Right;   LOWER EXTREMITY ANGIOGRAPHY Right 02/24/2023   Procedure: Lower Extremity Angiography;  Surgeon: Jama Cordella MATSU, MD;  Location: [facility];  Service: Cardiovascular;  Laterality: Right;   LOWER EXTREMITY INTERVENTION  10/10/2016   Procedure: Lower Extremity Intervention;  Surgeon: Cordella MATSU Jama, MD;  Location: [facility];  Service: Cardiovascular;;   PERIPHERAL VASCULAR BALLOON ANGIOPLASTY Left 10/10/2016   Procedure: Peripheral Vascular Balloon Angioplasty;  Surgeon: Cordella MATSU Jama, MD;  Location: [facility];  Service: Cardiovascular;  Laterality: Left;   TONSILLECTOMY     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/2022   Procedure: TRANSMETATARSAL AMPUTATION;  Surgeon: Gay Eva, DPM;  Location: [facility];  Service: Podiatry;  Laterality: Right;   WOUND DEBRIDEMENT Right 11/07/2016   Procedure: DEBRIDEMENT OF WOUND AND BONE RIGHT FOOT AND APPLY WOUND VAC;  Surgeon: Eva Gay, DPM;  Location: [facility];  Service: Podiatry;  Laterality: Right;    SOCIAL HISTORY: Social History   Socioeconomic History   Marital status: Single    Spouse name: Not on file   Number of children: Not on file   Years of education: Not on file   Highest education level: Not on file  Occupational History   Not on file  Tobacco Use   Smoking status: Former    Types: Pipe, Cigarettes    Quit date: 05/23/1975    Years since quitting: 48.9   Smokeless tobacco: Never  Vaping Use   Vaping status: Never Used  Substance and Sexual Activity   Alcohol use: No   Drug use: No   Sexual activity: Not on file  Other Topics Concern   Not on file  Social History Narrative   Not on file   Social Drivers of Health   Financial Resource Strain: Low Risk  (08/28/2023)    Received from [facility]   Overall Financial Resource Strain (CARDIA)    Difficulty of Paying Living Expenses: Not hard at all  Recent Concern: Financial Resource Strain - Medium Risk (08/06/2023)   Received from [facility]   Overall Financial Resource Strain (CARDIA)    Difficulty of Paying Living Expenses: Somewhat hard  Food Insecurity: No Food Insecurity (04/17/2024)   Hunger Vital Sign    Worried About Running Out of Food in the Last Year: Never true    Ran Out of Food in the Last Year: Never true  Transportation Needs: No Transportation Needs (04/17/2024)   PRAPARE - Administrator, Civil Service (Medical): No    Lack of Transportation (Non-Medical): No  Physical Activity: Inactive (08/28/2023)   Received from [facility]   Exercise Vital Sign    On average, how many days per week do you engage in moderate to strenuous exercise (like a brisk walk)?: 0 days    On average, how many minutes do you engage in exercise at this level?: 0 min  Stress: No Stress Concern Present (08/28/2023)   Received from [facility] - Occupational Stress Questionnaire  Feeling of Stress : Not at all  Social Connections: Moderately Integrated (04/17/2024)   Social Connection and Isolation Panel    Frequency of Communication with Friends and Family: Three times a week    Frequency of Social Gatherings with Friends and Family: Three times a week    Attends Religious Services: 1 to 4 times per year    Active Member of Clubs or Organizations: Yes    Attends Banker Meetings: 1 to 4 times per year    Marital Status: Never married  Intimate Partner Violence: Not At Risk (04/17/2024)   Humiliation, Afraid, Rape, and Kick questionnaire    Fear of Current or Ex-Partner: No    Emotionally Abused: No    Physically Abused: No    Sexually Abused: No    FAMILY  HISTORY: Family History  Problem Relation Age of Onset   Diabetes Mother    Hypertension Mother    Diabetes Father    Hypertension Father     ALLERGIES:  has no known allergies.  MEDICATIONS:  Current Facility-Administered Medications  Medication Dose Route Frequency Provider Last Rate Last Admin   acetaminophen  (TYLENOL ) tablet 650 mg  650 mg Oral Q6H PRN Core, Prentice BROCKS, MD       Or   acetaminophen  (TYLENOL ) suppository 650 mg  650 mg Rectal Q6H PRN Core, Prentice BROCKS, MD       aspirin  EC tablet 81 mg  81 mg Oral Daily Danford, Christopher P, MD   81 mg at 04/20/24 9080   atorvastatin  (LIPITOR) tablet 10 mg  10 mg Oral QPM Core, Prentice BROCKS, MD   10 mg at 04/19/24 1711   cyanocobalamin  (VITAMIN B12) injection 1,000 mcg  1,000 mcg Intramuscular Daily Patel, Sona, MD   1,000 mcg at 04/19/24 1825   cyanocobalamin  (VITAMIN B12) tablet 1,000 mcg  1,000 mcg Oral Daily Patel, Sona, MD   1,000 mcg at 04/20/24 9080   dextromethorphan -guaiFENesin  (MUCINEX  DM) 30-600 MG per 12 hr tablet 1 tablet  1 tablet Oral BID Kandis Devaughn Sayres, MD   1 tablet at 04/20/24 0919   ferrous sulfate  tablet 325 mg  325 mg Oral BID WC Core, Prentice BROCKS, MD   325 mg at 04/20/24 9080   insulin  aspart (novoLOG ) injection 0-15 Units  0-15 Units Subcutaneous TID WC Sreenath, Sudheer B, MD       insulin  aspart (novoLOG ) injection 0-5 Units  0-5 Units Subcutaneous QHS Sreenath, Sudheer B, MD       insulin  aspart (novoLOG ) injection 3 Units  3 Units Subcutaneous TID WC Patel, Sona, MD   3 Units at 04/19/24 1710   insulin  glargine (LANTUS ) injection 15 Units  15 Units Subcutaneous QHS Patel, Sona, MD   15 Units at 04/19/24 2200   leptospermum manuka honey (MEDIHONEY) paste 1 Application  1 Application Topical Daily Danford, Christopher P, MD   1 Application at 04/20/24 9077   lisinopril  (ZESTRIL ) tablet 5 mg  5 mg Oral Daily Jonel Lonni SQUIBB, MD   5 mg at 04/19/24 9070   Oral care mouth rinse  15 mL Mouth Rinse PRN Tobie Calix,  MD       oxyCODONE  (Oxy IR/ROXICODONE ) immediate release tablet 5 mg  5 mg Oral Q4H PRN Core, Prentice BROCKS, MD   5 mg at 04/19/24 1958   pneumococcal 20-valent conjugate vaccine (PREVNAR 20 ) injection 0.5 mL  0.5 mL Intramuscular Tomorrow-1000 Wouk, Devaughn Sayres, MD       polyethylene glycol (MIRALAX  / GLYCOLAX )  packet 17 g  17 g Oral Daily PRN Core, Prentice BROCKS, MD   17 g at 04/19/24 0532    PHYSICAL EXAMINATION:   Vitals:   04/20/24 0414 04/20/24 0805  BP: (!) 112/59 (!) 92/38  Pulse: 90 90  Resp: 20 17  Temp: 98.1 F (36.7 C) 98.4 F (36.9 C)  SpO2: 91% 94%   Filed Weights   04/16/24 1914 04/17/24 0124  Weight: 285 lb (129.3 kg) (!) 300 lb 14.9 oz (136.5 kg)    Physical Exam Vitals and nursing note reviewed.  HENT:     Head: Normocephalic and atraumatic.     Mouth/Throat:     Pharynx: Oropharynx is clear.  Eyes:     Extraocular Movements: Extraocular movements intact.     Pupils: Pupils are equal, round, and reactive to light.  Cardiovascular:     Rate and Rhythm: Normal rate and regular rhythm.  Pulmonary:     Comments: Decreased breath sounds bilaterally.  Abdominal:     Palpations: Abdomen is soft.  Musculoskeletal:        General: Normal range of motion.     Cervical back: Normal range of motion.  Skin:    General: Skin is warm.  Neurological:     General: No focal deficit present.     Mental Status: He is alert and oriented to person, place, and time.  Psychiatric:        Behavior: Behavior normal.        Judgment: Judgment normal.     LABORATORY DATA:  I have reviewed the data as listed Lab Results  Component Value Date   WBC 4.7 04/20/2024   HGB 7.9 (L) 04/20/2024   HCT 24.7 (L) 04/20/2024   MCV 89.8 04/20/2024   PLT 274 04/20/2024   Recent Labs    04/16/24 1142 04/16/24 2313 04/17/24 0236 04/19/24 0619  NA 142 137 139 136  K 4.3 4.2 3.8 4.4  CL 105 105 106 101  CO2 26 24 25 25   GLUCOSE 229* 219* 185* 238*  BUN 22 23 21 22   CREATININE 0.97  0.92 0.82 1.00  CALCIUM  9.4 8.9 8.9 8.9  GFRNONAA >60 >60 >60 >60  PROT 7.8  --  7.3  --   ALBUMIN 3.5  --  3.2*  --   AST 23  --  19  --   ALT 13  --  10  --   ALKPHOS 85  --  72  --   BILITOT 0.7  --  0.9  --     RADIOGRAPHIC STUDIES: I have personally reviewed the radiological images as listed and agreed with the findings in the report. MR PELVIS W WO CONTRAST Result Date: 04/19/2024 CLINICAL DATA:  Bone mass or bone pain, pelvis, aggressive features on xray Right iliac bone fracture on recent CT with possible pathologic features. History of multiple medical problems, including diabetes and chronic renal insufficiency. No given history of malignancy. EXAM: MRI PELVIS WITHOUT AND WITH CONTRAST TECHNIQUE: Multiplanar multisequence MR imaging of the pelvis was performed both before and after administration of intravenous contrast. CONTRAST:  10mL GADAVIST  GADOBUTROL  1 MMOL/ML IV SOLN COMPARISON:  Pelvic CT 04/16/2024. FINDINGS: Technical note: Despite efforts by the technologist and patient, mild to moderate motion artifact is present on today's exam and could not be eliminated. This reduces exam sensitivity and specificity. Urinary Tract: The visualized distal ureters and bladder appear unremarkable. Bowel: No bowel wall thickening, distention or surrounding inflammation identified within the pelvis. Bowel  assessment limited by motion. Vascular/Lymphatic: Multiple enlarged pelvic and inguinal lymph nodes bilaterally. There is a left pelvic sidewall node measuring up to 1.8 cm short axis on image 27/11. Largest right pelvic sidewall node measures 1.6 cm on image 28/11. Inguinal lymph nodes measure up to 1.1 cm short axis on the right (image 35/11). No acute vascular findings are identified. There is mild iliac atherosclerosis. Reproductive: Mild enlargement of the prostate gland with mild heterogeneous central gland enlargement. There is also a 1.9 cm in the posterior peripheral zone near the apex  (image 39/11). Other: Retroperitoneal and perirectal edema without significant ascites. Musculoskeletal: As seen on recent pelvic CT, there is a moderately displaced fracture of the right iliac bone. Between the displaced fracture fragments, there is a probable enhancing fluid collection which extends superiorly into the retroperitoneum, measuring approximately 6.1 x 13.5 x 4.6 cm. This likely represents a hematoma and may be slightly enlarged compared with the recent CT. This fracture is likely pathologic as there are innumerable enhancing osseous lesions consistent with widespread metastatic disease or multiple myeloma. Lesions are demonstrated within the lumbar spine, sacrum, both iliac bones, both pubic bones and both proximal femurs. Intra-articular, there are large lesions in both femoral necks and intertrochanteric regions which are prone to fracture. These measure up to 4.6 cm on the right and 3.0 cm on the left. In addition to the edema and enhancement surrounding the right iliac fracture, there is some edema within the proximal quadriceps musculature bilaterally. No focal soft tissue masses or other pathologic fractures are identified. IMPRESSION: 1. 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 Large lesions in both femoral necks and intertrochanteric regions are prone to fracture. 2. Displaced pathologic fracture of the right iliac bone with probable slightly enlarged hematoma between the displaced fracture fragments. 3. Pelvic and inguinal adenopathy, likely metastatic. 4. Mild enlargement of the prostate gland with heterogeneous central gland enlargement and a 1.9 cm lesion in the posterior peripheral zone near the apex. Findings could certainly be secondary to metastatic prostate cancer, and the patient's serum PSA level was markedly elevated (1,123) on recent lab analysis.  Electronically Signed   By: Elsie Perone M.D.   On: 04/19/2024 10:48   CT FEMUR RIGHT WO CONTRAST Result Date: 04/16/2024 CLINICAL DATA:  Fall nonweightbearing EXAM: CT OF THE LOWER RIGHT EXTREMITY WITHOUT CONTRAST TECHNIQUE: Multidetector CT imaging of the right lower extremity was performed according to the standard protocol. RADIATION DOSE REDUCTION: This exam was performed according to the departmental dose-optimization program which includes automated exposure control, adjustment of the mA and/or kV according to patient size and/or use of iterative reconstruction technique. COMPARISON:  Radiograph 04/16/2024 FINDINGS: Bones/Joint/Cartilage Partially visualized acute comminuted and displaced right iliac wing fracture. Pubic symphysis and right rami appear intact. No fracture or malalignment involving the right femur. Moderate advanced arthritis at the right knee. No significant knee effusion. Ligaments Suboptimally assessed by CT. Muscles and Tendons No intramuscular fluid collections. Mild fatty atrophy of the hamstring muscles. Soft tissues Edema within the right gluteus and proximal hip muscles. Edema within the subcutaneous soft tissues lateral aspect of the right thigh and imaged portions of the proximal calf. Vascular calcifications. Mildly enlarged right inguinal nodes measuring up to 12 mm. IMPRESSION: 1. Partially visualized acute comminuted and displaced right iliac wing fracture. 2. No acute osseous abnormality of the right femur. 3. Edema within the right gluteus and proximal  hip muscles, with edema in the lateral thigh soft tissues. 4. Mildly enlarged right inguinal nodes, nonspecific Electronically Signed   By: Luke Bun M.D.   On: 04/16/2024 22:53   CT PELVIS WO CONTRAST Result Date: 04/16/2024 CLINICAL DATA:  Fall, unable to bear weight EXAM: CT PELVIS WITHOUT CONTRAST TECHNIQUE: Multidetector CT imaging of the pelvis was performed following the standard protocol without intravenous  contrast. RADIATION DOSE REDUCTION: This exam was performed according to the departmental dose-optimization program which includes automated exposure control, adjustment of the mA and/or kV according to patient size and/or use of iterative reconstruction technique. COMPARISON:  Radiograph 04/16/2024 FINDINGS: Urinary Tract:  Urinary bladder is unremarkable Bowel:  No acute bowel wall thickening. Vascular/Lymphatic: No aneurysm.  No suspicious lymph nodes. Reproductive:  Enlarged prostate. Other:  Negative for pelvic effusion or free air. Musculoskeletal: Acute comminuted fracture involving the right iliac wing with displaced iliac crest fragment, the fragment is displaced posteriorly and laterally by about 2 cm. There is possible lucent change within the underlying bone raising possibility of pathologic fracture. Pubic symphysis appears intact. No rami fracture. The SI joints are non widened. There is considerable soft tissue stranding and contusion involving the right hip. Small volume right retroperitoneal and pelvic hematoma. IMPRESSION: 1. Acute comminuted and displaced fracture involving the right iliac wing with displaced iliac crest fragment. There is possible lucent change within the underlying bone raising possibility of pathologic fracture. 2. Considerable soft tissue stranding and contusion involving the right hip. Small volume right retroperitoneal and pelvic hematoma. Electronically Signed   By: Luke Bun M.D.   On: 04/16/2024 22:43   DG Hip Unilat  With Pelvis 2-3 Views Right Result Date: 04/16/2024 EXAM: 2 or 3 VIEW(S) XRAY OF THE PELVIS AND RIGHT HIP 04/16/2024 01:33:00 PM COMPARISON: None available. CLINICAL HISTORY: Fall yesterday around 2pm at grocery store. Reports his right leg just gave out. Worsening right hip pain since fall. Non ambulatory since getting home from store. FINDINGS: JOINTS: SI joints are symmetric. No acute fracture. Bilateral hips demonstrate normal alignment. Mild  bilateral hip osteoarthritis. SOFT TISSUES: The soft tissues are unremarkable. DISCS/DEGENERATIVE CHANGES: Lumbar degenerative disc disease. IMPRESSION: 1. No acute abnormality of the right hip. 2. Mild bilateral hip osteoarthritis. Electronically signed by: Waddell Calk MD 04/16/2024 01:47 PM EDT RP Workstation: [id number]    # 73 year old male patient with multiple medical problems-including obesity-CKD PVD prior history of DVT [2018 not on anticoagulation] with multiple bone lesions concerning for prostate cancer.  # Multiple bone lesions-including involvement of the femur bilateral-lumbar spine pelvis-without any obvious pathologic fracture.  Right iliac bone fracture-likely traumatic.  Likely secondary to prostate cancer-proceed with biopsy as planned.  # Anemia-iron  deficiency B12 deficiency.  Etiology of iron  deficiency is unclear. Pt awaiting colo as out pt.   # DM-CKD/PVD  # Borderline performance status-arthritis obesity- ECOG 2 at baseline.   Recommendations:  # Patient most likely has metastatic prostate cancer high-volume disease.  Patient would benefit from antiandrogen therapy plus androgen receptor blockers.  Patient is a poor candidate for any systemic chemotherapy.  # Recommend iron  infusions and also B12 injections.   # Thank you Dr.Sreenath for allowing me to participate in the care of your pleasant patient. Please do not hesitate to contact me with questions or concerns in the interim.  Above plan of care was discussed with the patient, his sister Inocente and also nephew in detail.  Patient will have appointment as outpatient discussed with pathology and the treatment plan.  Cindy JONELLE Joe, MD 04/20/2024 1:36 PM

## 2024-04-20 NOTE — Plan of Care (Signed)
  Problem: Fluid Volume: Goal: Ability to maintain a balanced intake and output will improve Outcome: Progressing   Problem: Health Behavior/Discharge Planning: Goal: Ability to identify and utilize available resources and services will improve Outcome: Progressing   Problem: Nutritional: Goal: Maintenance of adequate nutrition will improve Outcome: Progressing   Problem: Skin Integrity: Goal: Risk for impaired skin integrity will decrease Outcome: Progressing   Problem: Clinical Measurements: Goal: Will remain free from infection Outcome: Progressing   Problem: Nutrition: Goal: Adequate nutrition will be maintained Outcome: Progressing   Problem: Coping: Goal: Level of anxiety will decrease Outcome: Progressing

## 2024-04-21 DIAGNOSIS — S72009A Fracture of unspecified part of neck of unspecified femur, initial encounter for closed fracture: Secondary | ICD-10-CM | POA: Diagnosis not present

## 2024-04-21 LAB — GLUCOSE, CAPILLARY
Glucose-Capillary: 168 mg/dL — ABNORMAL HIGH (ref 70–99)
Glucose-Capillary: 236 mg/dL — ABNORMAL HIGH (ref 70–99)
Glucose-Capillary: 203 mg/dL — ABNORMAL HIGH (ref 70–99)
Glucose-Capillary: 305 mg/dL — ABNORMAL HIGH (ref 70–99)

## 2024-04-21 MED ORDER — IRON SUCROSE 300 MG IVPB - SIMPLE MED
300.0000 mg | Status: AC
  Administered 2024-04-21 – 2024-04-22 (×2): 300 mg via INTRAVENOUS
  Filled 2024-04-21 (×2): qty 300

## 2024-04-21 NOTE — Progress Notes (Signed)
 Occupational Therapy Treatment Patient Details Name: [name] MRN: [id number] DOB: 1951-01-24 Today's Date: 04/21/2024   History of present illness Pt is a 73 y.o. male presenting to hospital 04/16/24 s/p fall at grocery store.  Imaging showing acute comminuted and displaced fx involving R iliac wing with displaced iliac crest fragment; small volume R retroperitoneal and pelvic hematoma.  Pt admitted with acute comminuted and displaced fx involving  the R iliac wing with displaced iliac crest fragment, concern for pathological source of fx, secondary hypercoagulable state, acute blood loss anemia, open wound of R foot, hematoma.  Per ortho, plan for conservative treatment.  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   OT comments  Pt seen for co-tx with PT to optimize mobility efforts. Pt eager to participate and endorses only mild discomfort today. Pt demo's improvement in bed mobility and ADL transfers overall compared to previous sessions. Pt instructed in PLB and activity pacing to support safety and breath recovery. After mobility attempts, pt returned to bed for grooming tasks. Pt required MOD A for thoroughness with shaving his face, set up for washing his face and teeth brushing. Pt progressing and continues to benefit from skilled OT services.       If plan is discharge home, recommend the following:  Two people to help with walking and/or transfers;A lot of help with bathing/dressing/bathroom;Assist for transportation;Assistance with cooking/housework;Help with stairs or ramp for entrance   Equipment Recommendations  Other (comment) (defer)       Precautions / Restrictions Precautions Precautions: Fall Recall of Precautions/Restrictions: Impaired Precaution/Restrictions Comments: Displaced pathologic fracture of the right iliac bone, b/l intertrocanteric metistatic  lesions prone to fracture - officially no mobility restrictions Restrictions Weight Bearing Restrictions Per Provider Order: Yes RLE Weight Bearing Per Provider Order: Weight bearing as tolerated Other Position/Activity Restrictions: WBAT R LE per secure message with MD Cleotilde and MD Jonel 04/18/24.  Post op shoe R LE?       Mobility Bed Mobility Overal bed mobility: Needs Assistance Bed Mobility: Rolling, Sit to Supine, Supine to Sit Rolling: Min assist, Mod assist   Supine to sit: Mod assist, +2 for physical assistance Sit to supine: Mod assist, Max assist, +2 for physical assistance   General bed mobility comments: Pt much better able to scoot LEs toward EOB w/o heavy assist, HHA to pull up to sitting EOB, pt with much better fluidity of movement with less pain today    Transfers Overall transfer level: Needs assistance Equipment used: Rolling walker (2 wheels) Transfers: Sit to/from Stand, Bed to chair/wheelchair/BSC Sit to Stand: From elevated surface, Max assist, Mod assist, +2 physical assistance          Lateral/Scoot Transfers: Contact guard assist General transfer comment: Pt showed great effort with 2 standing bouts.  Heavily reliant on the walker - adjusted to more appropriate height on second attempt with better tolerance and UE biomechanical advantage.     Balance Overall balance assessment: Needs assistance Sitting-balance support: Bilateral upper extremity supported, Feet supported Sitting balance-Leahy Scale: Good Sitting balance - Comments: steady static sitting   Standing balance support: Bilateral upper extremity supported, Reliant on assistive device for balance Standing balance-Leahy Scale: Fair Standing balance comment: needed constant assist to insure safety, but once up over the walker did maintain static balance without excessive assistance.         ADL either performed  or assessed with clinical judgement   ADL Overall ADL's : Needs  assistance/impaired     Grooming: Bed level;Moderate assistance;Oral care;Wash/dry face Grooming Details (indicate cue type and reason): MOD A for thoroughness with shaving his face, set up for washing his face and teeth brushing         Communication Communication Communication: No apparent difficulties   Cognition Arousal: Alert Behavior During Therapy: WFL for tasks assessed/performed     Following commands: Intact        Cueing   Cueing Techniques: Verbal cues, Gestural cues, Tactile cues, Visual cues  Exercises Other Exercises Other Exercises: Pt instructed in PLB and required intermittent VCs for utilizing to support breath recovery    Shoulder Instructions       General Comments      Pertinent Vitals/ Pain       Pain Assessment Pain Assessment: Faces Faces Pain Scale: Hurts a little bit Pain Location: R hip Pain Descriptors / Indicators: Aching, Sore, Grimacing Pain Intervention(s): Limited activity within patient's tolerance, Monitored during session, Repositioned   Frequency  Min 2X/week        Progress Toward Goals  OT Goals(current goals can now be found in the care plan section)  Progress towards OT goals: Progressing toward goals  Acute Rehab OT Goals Patient Stated Goal: get better and go home OT Goal Formulation: With patient Time For Goal Achievement: 05/02/24 Potential to Achieve Goals: Good  Plan      Co-evaluation    PT/OT/SLP Co-Evaluation/Treatment: Yes Reason for Co-Treatment: For patient/therapist safety;To address functional/ADL transfers PT goals addressed during session: Mobility/safety with mobility;Balance;Proper use of DME OT goals addressed during session: ADL's and self-care      AM-PAC OT 6 Clicks Daily Activity     Outcome Measure   Help from another person eating meals?: None Help from another person taking care of personal grooming?: A Little Help from another person toileting, which includes using  toliet, bedpan, or urinal?: A Lot Help from another person bathing (including washing, rinsing, drying)?: A Lot Help from another person to put on and taking off regular upper body clothing?: A Little Help from another person to put on and taking off regular lower body clothing?: A Lot 6 Click Score: 16    End of Session Equipment Utilized During Treatment: Gait belt;Rolling walker (2 wheels);Oxygen  OT Visit Diagnosis: Other abnormalities of gait and mobility (R26.89);Muscle weakness (generalized) (M62.81);History of falling (Z91.81);Pain Pain - Right/Left: Right Pain - part of body: Hip   Activity Tolerance Patient tolerated treatment well   Patient Left in bed;with call bell/phone within reach;with bed alarm set   Nurse Communication          Time: 8870-8841 OT Time Calculation (min): 29 min  Charges: OT General Charges $OT Visit: 1 Visit OT Treatments $Self Care/Home Management : 8-22 mins  Warren SAUNDERS., MPH, MS, OTR/L ascom [phone] 04/21/24, 3:03 PM

## 2024-04-21 NOTE — Progress Notes (Signed)
 PROGRESS NOTE    JODEN BONSALL  FMW:[id number] DOB: 06-Mar-1951 DOA: 04/16/2024 PCP: Adina Buel HERO, MD    Brief Narrative:   73 y.o. M with chronic right foot ulcer, CKD IIIa, HTN, HLD, MO, DM and anemia who presented after a fall in a [employer] parking lot, initially seen in the ER for hip pain, radiographs normal, discharged, then returned and CT showed a iliac crest fracture.  Ortho consulted, confirmed that this was indeed a nonoperative fracture, patient reported he could not walk and so was kept for SNF placement.  Incidentally, CT was suspicious for pathologic fracture and PSA was markedly elevated.   Assessment & Plan:   Principal Problem:   Hip fracture (HCC) Active Problems:   Pathological fracture   Secondary hypercoagulable state (HCC)   Acute blood loss anemia   DM (diabetes mellitus), type 2 (HCC)   Obesity   PAD (peripheral artery disease) (HCC)   HTN (hypertension)   Hematoma   Open wound of right foot   CKD stage 3a, GFR 45-59 ml/min (HCC)   B12 deficiency   Prostate cancer metastatic to bone [facility])  Iliac crest fracture Suspected pathologic fracture Admitted and evaluated by orthopedics.  They recommend nonoperative management, workup for possible malignancy given lucency on imaging. Plan: Nonoperative management for iliac crest fracture.  High suspicion for metastatic prostate carcinoma with osseous metastasis.  IR consulted.  Status post image guided iliac biopsy on 8/20.  Tolerated procedure well.  Oncology consulted as well.  Case discussed with Dr. Rennie.   Normocytic anemia Acute blood loss anemia ruled out Based on labs from last Oct, this appears to be new B12 low Plan: Continue Daily B12 Continue Daily oral iron   Chronic right foot ulcer Local wound care   Chronic kidney disease stage IIIa Stable   Hypertension Hyperlipidemia Peripheral vascular disease BP near normal  Continue aspirin , lisinopril , atorvastatin    Morbid  obesity BMI 41.9.  Complicates overall care and prognosis.   Diabetes Glucose controlled Hold home metformin.   Glargine 15 units nightly NovoLog  3 units 3 times daily with meals Moderate sliding scale and nightly coverage   DVT prophylaxis: SCDs Code Status: Full Family Communication: Family member at bedside 8/20 Disposition Plan: Status is: Inpatient Remains inpatient appropriate because: Unsafe discharge plan.  Need skilled nursing facility.  Medically stable for discharge.   Level of care: Med-Surg  Consultants:  IR Medical oncology  Procedures:  CT-guided biopsy  Antimicrobials: None   Subjective: And examined.  Resting in bed.  No visible distress.  No complaints of pain.  Objective: Vitals:   04/20/24 2119 04/20/24 2151 04/21/24 0401 04/21/24 0946  BP:   (!) 102/52 (!) 114/57  Pulse: 100 96 85 86  Resp: (!) 21 20 17    Temp:   98.2 F (36.8 C) 98.2 F (36.8 C)  TempSrc:   Oral Oral  SpO2: 90% 92% 95% 92%  Weight:      Height:        Intake/Output Summary (Last 24 hours) at 04/21/2024 1134 Last data filed at 04/21/2024 0900 Gross per 24 hour  Intake 720 ml  Output 400 ml  Net 320 ml   Filed Weights   04/16/24 1914 04/17/24 0124  Weight: 129.3 kg (!) 136.5 kg    Examination:  General exam: NAD Respiratory system:  Lungs clear.  Normal work of breathing.  Room air Cardiovascular system: S1 S2, RRR, no murmurs, no pedal edema Gastrointestinal system: Soft, NT/ND, normal bowel sounds  Central nervous system: Alert and oriented. No focal neurological deficits. Extremities: Pain bilateral lower extremities.  Gait not assessed.  Decreased ROM. Skin: No rashes, lesions or ulcers Psychiatry: Judgement and insight appear normal. Mood & affect appropriate.     Data Reviewed: I have personally reviewed following labs and imaging studies  CBC: Recent Labs  Lab 04/16/24 1142 04/16/24 2313 04/17/24 0236 04/19/24 0619 04/20/24 0532  WBC 5.8 6.0  5.4 5.3 4.7  NEUTROABS 4.2 4.5  --   --   --   HGB 9.9* 8.9* 8.7* 8.4* 7.9*  HCT 31.1* 27.2* 26.5* 25.4* 24.7*  MCV 89.9 90.1 88.3 88.2 89.8  PLT 276 225 243 243 274   Basic Metabolic Panel: Recent Labs  Lab 04/16/24 1142 04/16/24 2313 04/17/24 0236 04/19/24 0619  NA 142 137 139 136  K 4.3 4.2 3.8 4.4  CL 105 105 106 101  CO2 26 24 25 25   GLUCOSE 229* 219* 185* 238*  BUN 22 23 21 22   CREATININE 0.97 0.92 0.82 1.00  CALCIUM  9.4 8.9 8.9 8.9   GFR: Estimated Creatinine Clearance: 92.9 mL/min (by C-G formula based on SCr of 1 mg/dL). Liver Function Tests: Recent Labs  Lab 04/16/24 1142 04/17/24 0236  AST 23 19  ALT 13 10  ALKPHOS 85 72  BILITOT 0.7 0.9  PROT 7.8 7.3  ALBUMIN 3.5 3.2*   No results for input(s): LIPASE, AMYLASE in the last 168 hours. No results for input(s): AMMONIA in the last 168 hours. Coagulation Profile: Recent Labs  Lab 04/17/24 0236 04/20/24 0532  INR 1.3* 1.2   Cardiac Enzymes: No results for input(s): CKTOTAL, CKMB, CKMBINDEX, TROPONINI in the last 168 hours. BNP (last 3 results) No results for input(s): PROBNP in the last 8760 hours. HbA1C: No results for input(s): HGBA1C in the last 72 hours. CBG: Recent Labs  Lab 04/20/24 1201 04/20/24 1649 04/20/24 1952 04/21/24 0743 04/21/24 1116  GLUCAP 159* 182* 249* 168* 236*   Lipid Profile: No results for input(s): CHOL, HDL, LDLCALC, TRIG, CHOLHDL, LDLDIRECT in the last 72 hours. Thyroid Function Tests: No results for input(s): TSH, T4TOTAL, FREET4, T3FREE, THYROIDAB in the last 72 hours. Anemia Panel: Recent Labs    04/19/24 0619  VITAMINB12 132*  FOLATE 12.1  FERRITIN 482*  TIBC 200*  IRON  31*   Sepsis Labs: No results for input(s): PROCALCITON, LATICACIDVEN in the last 168 hours.  No results found for this or any previous visit (from the past 240 hours).       Radiology Studies: CT BONE TROCAR/NEEDLE BIOPSY DEEP Result  Date: 04/20/2024 INDICATION: 73 year old male with multifocal lytic osseous lesions and a pathologic fracture of the right iliac bone. MRI demonstrates lesions throughout the skeleton. Due to patient's morbid obesity and difficulty with positioning, the only accessible biopsy location is the left iliac bone. EXAM: CT-guided bone biopsy TECHNIQUE: Multidetector CT imaging of the pelvis was performed following the standard protocol without IV contrast. RADIATION DOSE REDUCTION: This exam was performed according to the departmental dose-optimization program which includes automated exposure control, adjustment of the mA and/or kV according to patient size and/or use of iterative reconstruction technique. MEDICATIONS: None. ANESTHESIA/SEDATION: None COMPLICATIONS: None immediate. PROCEDURE: Informed written consent was obtained from the patient after a thorough discussion of the procedural risks, benefits and alternatives. All questions were addressed. Maximal Sterile Barrier Technique was utilized including caps, mask, sterile gowns, sterile gloves, sterile drape, hand hygiene and skin antiseptic. A timeout was performed prior to the initiation of the  procedure. A planning axial CT scan was performed. The left iliac bone was successfully identified. A suitable skin entry site was selected and marked. Local anesthesia was attained by infiltration with 1% lidocaine . A small dermatotomy was made. The 6 inch trocar needle was carefully advanced to the cortex of the left iliac bone. A core biopsy was then obtained. The biopsy specimen was placed in formalin and delivered to pathology for further analysis. IMPRESSION: Successful CT-guided biopsy of the left iliac bone. Electronically Signed   By: Wilkie Lent M.D.   On: 04/20/2024 16:07        Scheduled Meds:  aspirin  EC  81 mg Oral Daily   atorvastatin   10 mg Oral QPM   vitamin B-12  1,000 mcg Oral Daily   dextromethorphan -guaiFENesin   1 tablet Oral BID    ferrous sulfate   325 mg Oral BID WC   insulin  aspart  0-15 Units Subcutaneous TID WC   insulin  aspart  0-5 Units Subcutaneous QHS   insulin  aspart  3 Units Subcutaneous TID WC   insulin  glargine  15 Units Subcutaneous QHS   leptospermum manuka honey  1 Application Topical Daily   lisinopril   5 mg Oral Daily   Continuous Infusions:  iron  sucrose       LOS: 4 days    Calvin KATHEE Robson, MD [facility]   If 7PM-7AM, please contact night-coverage  04/21/2024, 11:34 AM

## 2024-04-21 NOTE — Care Management Important Message (Signed)
 Important Message  Patient Details  Name: [name] MRN: [id number] Date of Birth: 04-May-1951   Important Message Given:  Yes - Medicare IM     Zariah Jost W, CMA 04/21/2024, 10:57 AM

## 2024-04-21 NOTE — Plan of Care (Signed)
  Problem: Coping: Goal: Ability to adjust to condition or change in health will improve Outcome: Progressing   Problem: Coping: Goal: Level of anxiety will decrease Outcome: Progressing   Problem: Elimination: Goal: Will not experience complications related to urinary retention Outcome: Progressing

## 2024-04-21 NOTE — Progress Notes (Signed)
 Physical Therapy Treatment Patient Details Name: [name] MRN: [id number] DOB: [birth year] Today's Date: 04/21/2024   History of Present Illness Pt is a 73 y.o. male presenting to hospital 04/16/24 s/p fall at grocery store.  Imaging showing acute comminuted and displaced fx involving R iliac wing with displaced iliac crest fragment; small volume R retroperitoneal and pelvic hematoma.  Pt admitted with acute comminuted and displaced fx involving  the R iliac wing with displaced iliac crest fragment, concern for pathological source of fx, secondary hypercoagulable state, acute blood loss anemia, open wound of R foot, hematoma.  Per ortho, plan for conservative treatment.  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    PT Comments  Co-treat with OT, pt with less pain and better overall tolerance today with mobility and activity. He was able to assist much more with getting up to sitting EOB, and was able to do 2 bouts of EOB standing, the second of which he was able to do some side shuffling/heel-toe to the side.  Pt needing constant direct assist (and heavy assist to get back to supine after the effort) but overall showed good improvement and tolerance this session.  Will benefit from ongoing PT, continue with POC.    If plan is discharge home, recommend the following: Two people to help with walking and/or transfers;A lot of help with bathing/dressing/bathroom;Assistance with cooking/housework;Assist for transportation;Help with stairs or ramp for entrance   Can travel by private vehicle     No  Equipment Recommendations  Other (comment)    Recommendations for Other Services       Precautions / Restrictions Precautions Precautions: Fall Restrictions RLE Weight Bearing Per Provider Order: Weight bearing as tolerated Other Position/Activity Restrictions: WBAT R LE per secure  message with MD Cleotilde and MD Jonel 04/18/24.  Post op shoe R LE?     Mobility  Bed Mobility Overal bed mobility: Needs Assistance Bed Mobility: Rolling, Sit to Supine, Supine to Sit Rolling: Min assist, Mod assist   Supine to sit: Mod assist, +2 for physical assistance Sit to supine: Mod assist, Max assist, +2 for physical assistance   General bed mobility comments: Pt much better able to scoot LEs toward EOB w/o heavy assist, HHA to pull up to sitting EOB, pt with much better fluidity of movement with less pain today    Transfers Overall transfer level: Needs assistance Equipment used: Rolling walker (2 wheels) Transfers: Sit to/from Stand, Bed to chair/wheelchair/BSC Sit to Stand: From elevated surface, Max assist, Mod assist, +2 physical assistance          Lateral/Scoot Transfers: Contact guard assist General transfer comment: Pt showed great effort with 2 standing bouts.  Heavily reliant on the walker - adjusted to more appropriate height on second attempt with better tolerance and UE biomechanical advantage.    Ambulation/Gait               General Gait Details: no true ambulation away from EOB, but able to do some labored but effective heel-toe shuffles to the L along EOB with +2 modA, assist with walker manipulation   Stairs             Wheelchair Mobility     Tilt Bed    Modified Rankin (Stroke Patients Only)       Balance Overall balance assessment: Needs assistance Sitting-balance support: Bilateral upper extremity supported,  Feet supported Sitting balance-Leahy Scale: Good Sitting balance - Comments: steady static sitting   Standing balance support: Bilateral upper extremity supported, Reliant on assistive device for balance Standing balance-Leahy Scale: Fair Standing balance comment: needed constant assist to insure safety, but once up over the walker did maintain static balance without excessive assistance.                             Communication Communication Communication: No apparent difficulties  Cognition Arousal: Alert Behavior During Therapy: WFL for tasks assessed/performed   PT - Cognitive impairments: No family/caregiver present to determine baseline                         Following commands: Intact      Cueing    Exercises      General Comments        Pertinent Vitals/Pain Pain Assessment Pain Assessment: Faces Faces Pain Scale: Hurts a little bit Pain Location: R hip (pt reports very little pain today)    Home Living                          Prior Function            PT Goals (current goals can now be found in the care plan section) Progress towards PT goals: Progressing toward goals    Frequency    Min 2X/week      PT Plan      Co-evaluation PT/OT/SLP Co-Evaluation/Treatment: Yes Reason for Co-Treatment: For patient/therapist safety;To address functional/ADL transfers PT goals addressed during session: Mobility/safety with mobility;Balance;Proper use of DME        AM-PAC PT 6 Clicks Mobility   Outcome Measure  Help needed turning from your back to your side while in a flat bed without using bedrails?: A Lot Help needed moving from lying on your back to sitting on the side of a flat bed without using bedrails?: A Lot Help needed moving to and from a bed to a chair (including a wheelchair)?: Total Help needed standing up from a chair using your arms (e.g., wheelchair or bedside chair)?: Total Help needed to walk in hospital room?: Total Help needed climbing 3-5 steps with a railing? : Total 6 Click Score: 8    End of Session Equipment Utilized During Treatment: Gait belt Activity Tolerance: Patient limited by fatigue Patient left: with call bell/phone within reach;with bed alarm set;Other (comment) (OT handoff) Nurse Communication: Mobility status PT Visit Diagnosis: Other abnormalities of gait and mobility (R26.89);Muscle  weakness (generalized) (M62.81);History of falling (Z91.81);Pain Pain - Right/Left: Right Pain - part of body: Hip     Time: 8870-8851 PT Time Calculation (min) (ACUTE ONLY): 19 min  Charges:    $Therapeutic Activity: 8-22 mins PT General Charges $$ ACUTE PT VISIT: 1 Visit                     [name], DPT 04/21/2024, 1:59 PM

## 2024-04-21 NOTE — Plan of Care (Signed)
  Problem: Coping: Goal: Ability to adjust to condition or change in health will improve Outcome: Progressing   Problem: Fluid Volume: Goal: Ability to maintain a balanced intake and output will improve Outcome: Progressing   Problem: Health Behavior/Discharge Planning: Goal: Ability to identify and utilize available resources and services will improve Outcome: Progressing   Problem: Nutritional: Goal: Maintenance of adequate nutrition will improve Outcome: Progressing   Problem: Activity: Goal: Risk for activity intolerance will decrease Outcome: Progressing

## 2024-04-22 ENCOUNTER — Other Ambulatory Visit: Payer: Self-pay

## 2024-04-22 ENCOUNTER — Telehealth: Payer: Self-pay | Admitting: Internal Medicine

## 2024-04-22 ENCOUNTER — Other Ambulatory Visit: Payer: Self-pay | Admitting: Internal Medicine

## 2024-04-22 ENCOUNTER — Other Ambulatory Visit: Payer: Self-pay | Admitting: *Deleted

## 2024-04-22 DIAGNOSIS — C61 Malignant neoplasm of prostate: Secondary | ICD-10-CM | POA: Diagnosis not present

## 2024-04-22 DIAGNOSIS — S72009A Fracture of unspecified part of neck of unspecified femur, initial encounter for closed fracture: Secondary | ICD-10-CM | POA: Diagnosis not present

## 2024-04-22 DIAGNOSIS — D509 Iron deficiency anemia, unspecified: Secondary | ICD-10-CM

## 2024-04-22 LAB — GLUCOSE, CAPILLARY
Glucose-Capillary: 174 mg/dL — ABNORMAL HIGH (ref 70–99)
Glucose-Capillary: 198 mg/dL — ABNORMAL HIGH (ref 70–99)
Glucose-Capillary: 157 mg/dL — ABNORMAL HIGH (ref 70–99)
Glucose-Capillary: 192 mg/dL — ABNORMAL HIGH (ref 70–99)

## 2024-04-22 MED ORDER — FLEET ENEMA RE ENEM
1.0000 | ENEMA | Freq: Once | RECTAL | Status: AC
  Administered 2024-04-22: 1 via RECTAL

## 2024-04-22 MED ORDER — CYANOCOBALAMIN 1000 MCG PO TABS: 1000.0000 ug | ORAL_TABLET | Freq: Every day | ORAL | Status: DC

## 2024-04-22 MED ORDER — OXYCODONE HCL 5 MG PO TABS: 5.0000 mg | ORAL_TABLET | ORAL | 0 refills | Status: DC | PRN

## 2024-04-22 MED ORDER — INSULIN ASPART 100 UNIT/ML IJ SOLN
4.0000 [IU] | Freq: Three times a day (TID) | INTRAMUSCULAR | Status: DC
  Administered 2024-04-22 (×2): 4 [IU] via SUBCUTANEOUS
  Filled 2024-04-22 (×3): qty 1

## 2024-04-22 MED ORDER — INSULIN GLARGINE 100 UNIT/ML ~~LOC~~ SOLN
18.0000 [IU] | Freq: Every day | SUBCUTANEOUS | Status: DC
  Administered 2024-04-22: 18 [IU] via SUBCUTANEOUS
  Filled 2024-04-22: qty 0.18

## 2024-04-22 NOTE — Telephone Encounter (Signed)
 follow-up SEP 2nd- -MD;  venofer ; firm[facility]. 30 mins- labs CBC CMP;HOLD tube possibleD-2 1 unit blood;  If cannot reach pt- [name]- sister good contact. GB

## 2024-04-22 NOTE — Progress Notes (Signed)
 PROGRESS NOTE    [name]  FMW:[id number] DOB: [birth year] DOA: 04/16/2024 PCP: Adina Buel HERO, MD    Brief Narrative:   73 y.o. M with chronic right foot ulcer, CKD IIIa, HTN, HLD, MO, DM and anemia who presented after a fall in a [employer] parking lot, initially seen in the ER for hip pain, radiographs normal, discharged, then returned and CT showed a iliac crest fracture.  Ortho consulted, confirmed that this was indeed a nonoperative fracture, patient reported he could not walk and so was kept for SNF placement.  Incidentally, CT was suspicious for pathologic fracture and PSA was markedly elevated.   Assessment & Plan:   Principal Problem:   Hip fracture (HCC) Active Problems:   Pathological fracture   Secondary hypercoagulable state (HCC)   Acute blood loss anemia   DM (diabetes mellitus), type 2 (HCC)   Obesity   PAD (peripheral artery disease) (HCC)   HTN (hypertension)   Hematoma   Open wound of right foot   CKD stage 3a, GFR 45-59 ml/min (HCC)   B12 deficiency   Prostate cancer metastatic to bone [facility])  Iliac crest fracture Suspected pathologic fracture Admitted and evaluated by orthopedics.  They recommend nonoperative management, workup for possible malignancy given lucency on imaging. Plan: Nonoperative management for iliac crest fracture.  High suspicion for metastatic prostate carcinoma with osseous metastasis.  IR consulted.  Status post image guided iliac biopsy on 8/20.  Tolerated procedure well.  Oncology consulted as well.  Case discussed with Dr. Rennie.  Patient is medically stable for discharge.  Will need SNF Will NOT be receiving chemotherapy while at SNF.  Will need transport to the cancer center for monthly antitestosterone shots   Normocytic anemia Acute blood loss anemia ruled out Based on labs from last Oct, this appears to be new B12 low Plan: Continue Daily B12 Continue Daily oral iron   Chronic right foot ulcer Local wound  care   Chronic kidney disease stage IIIa Stable   Hypertension Hyperlipidemia Peripheral vascular disease BP near normal  Continue aspirin , lisinopril , atorvastatin    Morbid obesity BMI 41.9.  Complicates overall care and prognosis.   Diabetes Glucose controlled Hold home metformin.   Glargine 18 units nightly NovoLog  4 units 3 times daily with meals Moderate sliding scale and nightly coverage   DVT prophylaxis: SCDs Code Status: Full Family Communication: Family member at bedside 8/20 Disposition Plan: Status is: Inpatient Remains inpatient appropriate because: Unsafe discharge plan.  Need skilled nursing facility.  Medically stable for discharge.   Level of care: Med-Surg  Consultants:  IR Medical oncology  Procedures:  CT-guided biopsy  Antimicrobials: None   Subjective: Seen and examined.  Resting comfortably in bed.  No visible distress.  No complaints of pain.  Objective: Vitals:   04/21/24 0946 04/21/24 2048 04/22/24 0242 04/22/24 0724  BP: (!) 114/57 130/63 (!) 105/55 (!) 113/54  Pulse: 86 (!) 105 94 89  Resp:  18 18 16   Temp: 98.2 F (36.8 C) 98.9 F (37.2 C) 98.4 F (36.9 C) 98 F (36.7 C)  TempSrc: Oral   Oral  SpO2: 92% 92% 93% 92%  Weight:      Height:        Intake/Output Summary (Last 24 hours) at 04/22/2024 1402 Last data filed at 04/22/2024 1003 Gross per 24 hour  Intake 970 ml  Output 1275 ml  Net -305 ml   Filed Weights   04/16/24 1914 04/17/24 0124  Weight: 129.3 kg ROLLEN)  136.5 kg    Examination:  General exam: No acute distress Respiratory system:  Lungs clear.  Normal work of breathing.  Room air Cardiovascular system: S1 S2, RRR, no murmurs, no pedal edema Gastrointestinal system: Soft, NT/ND, normal bowel sounds Central nervous system: Alert and oriented. No focal neurological deficits. Extremities: Pain bilateral lower extremities.  Gait not assessed.  Decreased ROM. Skin: No rashes, lesions or ulcers Psychiatry:  Judgement and insight appear normal. Mood & affect appropriate.     Data Reviewed: I have personally reviewed following labs and imaging studies  CBC: Recent Labs  Lab 04/16/24 1142 04/16/24 2313 04/17/24 0236 04/19/24 0619 04/20/24 0532  WBC 5.8 6.0 5.4 5.3 4.7  NEUTROABS 4.2 4.5  --   --   --   HGB 9.9* 8.9* 8.7* 8.4* 7.9*  HCT 31.1* 27.2* 26.5* 25.4* 24.7*  MCV 89.9 90.1 88.3 88.2 89.8  PLT 276 225 243 243 274   Basic Metabolic Panel: Recent Labs  Lab 04/16/24 1142 04/16/24 2313 04/17/24 0236 04/19/24 0619  NA 142 137 139 136  K 4.3 4.2 3.8 4.4  CL 105 105 106 101  CO2 26 24 25 25   GLUCOSE 229* 219* 185* 238*  BUN 22 23 21 22   CREATININE 0.97 0.92 0.82 1.00  CALCIUM  9.4 8.9 8.9 8.9   GFR: Estimated Creatinine Clearance: 92.9 mL/min (by C-G formula based on SCr of 1 mg/dL). Liver Function Tests: Recent Labs  Lab 04/16/24 1142 04/17/24 0236  AST 23 19  ALT 13 10  ALKPHOS 85 72  BILITOT 0.7 0.9  PROT 7.8 7.3  ALBUMIN 3.5 3.2*   No results for input(s): LIPASE, AMYLASE in the last 168 hours. No results for input(s): AMMONIA in the last 168 hours. Coagulation Profile: Recent Labs  Lab 04/17/24 0236 04/20/24 0532  INR 1.3* 1.2   Cardiac Enzymes: No results for input(s): CKTOTAL, CKMB, CKMBINDEX, TROPONINI in the last 168 hours. BNP (last 3 results) No results for input(s): PROBNP in the last 8760 hours. HbA1C: No results for input(s): HGBA1C in the last 72 hours. CBG: Recent Labs  Lab 04/21/24 1116 04/21/24 1653 04/21/24 2202 04/22/24 0717 04/22/24 1152  GLUCAP 236* 203* 305* 174* 198*   Lipid Profile: No results for input(s): CHOL, HDL, LDLCALC, TRIG, CHOLHDL, LDLDIRECT in the last 72 hours. Thyroid Function Tests: No results for input(s): TSH, T4TOTAL, FREET4, T3FREE, THYROIDAB in the last 72 hours. Anemia Panel: No results for input(s): VITAMINB12, FOLATE, FERRITIN, TIBC, IRON ,  RETICCTPCT in the last 72 hours.  Sepsis Labs: No results for input(s): PROCALCITON, LATICACIDVEN in the last 168 hours.  No results found for this or any previous visit (from the past 240 hours).       Radiology Studies: CT BONE TROCAR/NEEDLE BIOPSY DEEP Result Date: 04/20/2024 INDICATION: 73 year old male with multifocal lytic osseous lesions and a pathologic fracture of the right iliac bone. MRI demonstrates lesions throughout the skeleton. Due to patient's morbid obesity and difficulty with positioning, the only accessible biopsy location is the left iliac bone. EXAM: CT-guided bone biopsy TECHNIQUE: Multidetector CT imaging of the pelvis was performed following the standard protocol without IV contrast. RADIATION DOSE REDUCTION: This exam was performed according to the departmental dose-optimization program which includes automated exposure control, adjustment of the mA and/or kV according to patient size and/or use of iterative reconstruction technique. MEDICATIONS: None. ANESTHESIA/SEDATION: None COMPLICATIONS: None immediate. PROCEDURE: Informed written consent was obtained from the patient after a thorough discussion of the procedural risks, benefits and alternatives.  All questions were addressed. Maximal Sterile Barrier Technique was utilized including caps, mask, sterile gowns, sterile gloves, sterile drape, hand hygiene and skin antiseptic. A timeout was performed prior to the initiation of the procedure. A planning axial CT scan was performed. The left iliac bone was successfully identified. A suitable skin entry site was selected and marked. Local anesthesia was attained by infiltration with 1% lidocaine . A small dermatotomy was made. The 6 inch trocar needle was carefully advanced to the cortex of the left iliac bone. A core biopsy was then obtained. The biopsy specimen was placed in formalin and delivered to pathology for further analysis. IMPRESSION: Successful CT-guided biopsy of  the left iliac bone. Electronically Signed   By: Wilkie Lent M.D.   On: 04/20/2024 16:07        Scheduled Meds:  aspirin  EC  81 mg Oral Daily   atorvastatin   10 mg Oral QPM   vitamin B-12  1,000 mcg Oral Daily   dextromethorphan -guaiFENesin   1 tablet Oral BID   ferrous sulfate   325 mg Oral BID WC   insulin  aspart  0-15 Units Subcutaneous TID WC   insulin  aspart  0-5 Units Subcutaneous QHS   insulin  aspart  4 Units Subcutaneous TID WC   insulin  glargine  18 Units Subcutaneous QHS   leptospermum manuka honey  1 Application Topical Daily   lisinopril   5 mg Oral Daily   sodium phosphate   1 enema Rectal Once   Continuous Infusions:  iron  sucrose 300 mg (04/21/24 1650)     LOS: 5 days    Jerry KATHEE Robson, MD [facility]   If 7PM-7AM, please contact night-coverage  04/22/2024, 2:02 PM

## 2024-04-22 NOTE — Plan of Care (Signed)

## 2024-04-22 NOTE — Progress Notes (Signed)
 Kenly Xiao Regner   DOB:27-Feb-1951   FM#:[id number]    Subjective: Patient resting the bed having lunch.  Denies any worsening pain.  Constipated.  Awaiting enema.  Denies any blood in stools or black-colored stools.  Objective:  Vitals:   04/22/24 0242 04/22/24 0724  BP: (!) 105/55 (!) 113/54  Pulse: 94 89  Resp: 18 16  Temp: 98.4 F (36.9 C) 98 F (36.7 C)  SpO2: 93% 92%     Intake/Output Summary (Last 24 hours) at 04/22/2024 1225 Last data filed at 04/22/2024 1003 Gross per 24 hour  Intake 1210 ml  Output 1275 ml  Net -65 ml    Physical Exam Vitals and nursing note reviewed.  HENT:     Head: Normocephalic and atraumatic.     Mouth/Throat:     Pharynx: Oropharynx is clear.  Eyes:     Extraocular Movements: Extraocular movements intact.     Pupils: Pupils are equal, round, and reactive to light.  Cardiovascular:     Rate and Rhythm: Normal rate and regular rhythm.  Pulmonary:     Comments: Decreased breath sounds bilaterally.  Abdominal:     Palpations: Abdomen is soft.  Musculoskeletal:        General: Normal range of motion.     Cervical back: Normal range of motion.  Skin:    General: Skin is warm.  Neurological:     General: No focal deficit present.     Mental Status: He is alert and oriented to person, place, and time.  Psychiatric:        Behavior: Behavior normal.        Judgment: Judgment normal.      Labs:  Lab Results  Component Value Date   WBC 4.7 04/20/2024   HGB 7.9 (L) 04/20/2024   HCT 24.7 (L) 04/20/2024   MCV 89.8 04/20/2024   PLT 274 04/20/2024   NEUTROABS 4.5 04/16/2024    Lab Results  Component Value Date   NA 136 04/19/2024   K 4.4 04/19/2024   CL 101 04/19/2024   CO2 25 04/19/2024    Studies:  CT BONE TROCAR/NEEDLE BIOPSY DEEP Result Date: 04/20/2024 INDICATION: 73 year old male with multifocal lytic osseous lesions and a pathologic fracture of the right iliac bone. MRI demonstrates lesions throughout the skeleton. Due to  patient's morbid obesity and difficulty with positioning, the only accessible biopsy location is the left iliac bone. EXAM: CT-guided bone biopsy TECHNIQUE: Multidetector CT imaging of the pelvis was performed following the standard protocol without IV contrast. RADIATION DOSE REDUCTION: This exam was performed according to the departmental dose-optimization program which includes automated exposure control, adjustment of the mA and/or kV according to patient size and/or use of iterative reconstruction technique. MEDICATIONS: None. ANESTHESIA/SEDATION: None COMPLICATIONS: None immediate. PROCEDURE: Informed written consent was obtained from the patient after a thorough discussion of the procedural risks, benefits and alternatives. All questions were addressed. Maximal Sterile Barrier Technique was utilized including caps, mask, sterile gowns, sterile gloves, sterile drape, hand hygiene and skin antiseptic. A timeout was performed prior to the initiation of the procedure. A planning axial CT scan was performed. The left iliac bone was successfully identified. A suitable skin entry site was selected and marked. Local anesthesia was attained by infiltration with 1% lidocaine . A small dermatotomy was made. The 6 inch trocar needle was carefully advanced to the cortex of the left iliac bone. A core biopsy was then obtained. The biopsy specimen was placed in formalin and delivered to pathology  for further analysis. IMPRESSION: Successful CT-guided biopsy of the left iliac bone. Electronically Signed   By: Wilkie Lent M.D.   On: 04/20/2024 16:07    Successful CT-guided biopsy of the left iliac bone   # 84 -year-old male patient with multiple medical problems-including obesity-CKD PVD prior history of DVT [2018 not on anticoagulation] with multiple bone lesions concerning for prostate cancer.   # Multiple bone lesions-including involvement of the femur bilateral-lumbar spine pelvis-without any obvious  pathologic fracture.  Right iliac bone fracture-likely traumatic.  Likely secondary to prostate cancer-status post biopsy pathology pending.  PSA greater than thousand.  Discussed the role of antitestosterone therapy.  Plan [facility] next visit/out pt. Consider darolutamide-as patient not a good candidate for chemotherapy.   # Anemia-iron  deficiency B12 deficiency.  Etiology of iron  deficiency is unclear. Pt awaiting colo as out pt.    # DM-CKD/PVD   # Borderline performance status-arthritis obesity- ECOG 2 at baseline.   # Patient currently awaiting discharge to rehab-discussed with patient's Sister Inocente.  Will plan follow-up SEP 2nd- -MD;  venofer ; firmagon. 30 mins- labs CBC CMP;HOLD tube possibleD-2 1 unit blood;   Cindy JONELLE Joe, MD 04/22/2024  12:25 PM

## 2024-04-22 NOTE — Telephone Encounter (Signed)
 Labs entered.

## 2024-04-22 NOTE — Inpatient Diabetes Management (Signed)
 Inpatient Diabetes Program Recommendations  AACE/ADA: New Consensus Statement on Inpatient Glycemic Control (2015)  Target Ranges:  Prepandial:   less than 140 mg/dL      Peak postprandial:   less than 180 mg/dL (1-2 hours)      Critically ill patients:  140 - 180 mg/dL   Lab Results  Component Value Date   GLUCAP 174 (H) 04/22/2024   HGBA1C 6.7 (H) 04/17/2024    Latest Reference Range & Units 04/21/24 07:43 04/21/24 11:16 04/21/24 16:53 04/21/24 22:02 04/22/24 07:17  Glucose-Capillary 70 - 99 mg/dL 831 (H) 763 (H) 796 (H) 305 (H) 174 (H)   Diabetes history: DM2 Outpatient Diabetes medications: Lantus  15 units daily, Humalog 4 units tid meal coverage, Metformin 1 gm bid Current orders for Inpatient glycemic control: Lantus  15 units daily, Novolog  0-15 units tid with meals and HS, Novolog  3 units tid with meals  Inpatient Diabetes Program Recommendations:    Please consider increasing Lantus  to 18 units daily and increase Novolog  meal coverage to 4 units tid with meals.   Thanks,  Randall Bullocks, RN, BC-ADM Inpatient Diabetes Coordinator Pager [phone]  (8a-5p)

## 2024-04-22 NOTE — Discharge Summary (Addendum)
 Physician Discharge Summary  [name] FMW:[id number] DOB: July 30, 1951 DOA: 04/16/2024  PCP: Adina Buel HERO, MD  Admit date: 04/16/2024 Discharge date: 04/23/2024  Admitted From: Home Disposition:  SNF  Recommendations for Outpatient Follow-up:  Follow up with PCP in 1-2 weeks Follow-up in cancer center as directed by oncology  Home Health: No Equipment/Devices: None  Discharge Condition: Stable CODE STATUS: Full Diet recommendation: Carb modified  Brief/Interim Summary:   73 y.o. M with chronic right foot ulcer, CKD IIIa, HTN, HLD, MO, DM and anemia who presented after a fall in a [employer] parking lot, initially seen in the ER for hip pain, radiographs normal, discharged, then returned and CT showed a iliac crest fracture.  Ortho consulted, confirmed that this was indeed a nonoperative fracture, patient reported he could not walk and so was kept for SNF placement.  Incidentally, CT was suspicious for pathologic fracture and PSA was markedly elevated.      Discharge Diagnoses:  Principal Problem:   Hip fracture (HCC) Active Problems:   Pathological fracture   Secondary hypercoagulable state (HCC)   Acute blood loss anemia   DM (diabetes mellitus), type 2 (HCC)   Obesity   PAD (peripheral artery disease) (HCC)   HTN (hypertension)   Hematoma   Open wound of right foot   CKD stage 3a, GFR 45-59 ml/min (HCC)   B12 deficiency   Prostate cancer metastatic to bone [facility]) Iliac crest fracture Suspected pathologic fracture Admitted and evaluated by orthopedics.  They recommend nonoperative management, workup for possible malignancy given lucency on imaging. Plan: Nonoperative management for iliac crest fracture.  High suspicion for metastatic prostate carcinoma with osseous metastasis.  IR consulted.  Status post image guided iliac biopsy on 8/20.  Tolerated procedure well.  Oncology consulted as well.  Case discussed with Dr. Rennie.  Patient is medically stable for  discharge.  Will need SNF  Will NOT be receiving chemotherapy while at SNF.  Will need transport to the cancer center for monthly antitestosterone shots Normocytic anemia Acute blood loss anemia ruled out Based on labs from last Oct, this appears to be new B12 low Plan: Continue Daily B12 Continue Daily oral iron    Chronic right foot ulcer Local wound care   Chronic kidney disease stage IIIa Stable   Hypertension Hyperlipidemia Peripheral vascular disease BP near normal  Continue aspirin , lisinopril , atorvastatin    Morbid obesity BMI 41.9.  Complicates overall care and prognosis.   Diabetes Glucose controlled Resume home glycemic regimen    Discharge Instructions  Discharge Instructions     Diet - low sodium heart healthy   Complete by: As directed    Discharge wound care:   Complete by: As directed    Wound care  Every shift      Comments: R foot: Cleanse the wound with Vashe #[id number], pat dry the periwound skin. Apply Medihoney to the wound bed. Apply thin layer of barrier cream on the hyperkeratosis. Cover the wound bed and the keratosis with the foam dressing. Change daily or PRN.   Increase activity slowly   Complete by: As directed       Allergies as of 04/22/2024   No Known Allergies      Medication List     STOP taking these medications    CENTROVITE Tabs   HYDROcodone -acetaminophen  5-325 MG tablet Commonly known as: NORCO/VICODIN   senna 8.6 MG Tabs tablet Commonly known as: SENOKOT       TAKE these medications  Accu-Chek Aviva Plus test strip Generic drug: glucose blood   Accu-Chek Guide test strip Generic drug: glucose blood Use 1 strip via meter four times a day  to monitor blood glucose   Accu-Chek Softclix Lancets lancets   acetaminophen  325 MG tablet Commonly known as: TYLENOL  Take 650 mg by mouth every 6 (six) hours as needed.   aspirin  EC 81 MG tablet Take 81 mg by mouth daily. Swallow whole.   atorvastatin  10 MG  tablet Commonly known as: LIPITOR Take 10 mg by mouth every evening.   cyanocobalamin  1000 MCG tablet Take 1 tablet (1,000 mcg total) by mouth daily. Start taking on: April 23, 2024   FeroSul 325 (65 Fe) MG tablet Generic drug: ferrous sulfate  Take 325 mg by mouth 2 (two) times daily with a meal.   GAS-X ULTRA STRENGTH PO Take 1 capsule by mouth 4 (four) times daily as needed (for flatulence).   insulin  glargine 100 UNIT/ML injection Commonly known as: LANTUS  Inject 0.15 mLs (15 Units total) into the skin at bedtime.   insulin  lispro 100 UNIT/ML injection Commonly known as: HUMALOG Inject 4 Units into the skin See admin instructions. Inject 4 units SQ with breakfast, inject 4 units SQ with lunch and inject 4 units SQ with dinner   Insulin  Syringe-Needle U-100 31G X 5/16 0.3 ML Misc   Droplet Insulin  Syringe 31G X 5/16 0.3 ML Misc Generic drug: Insulin  Syringe-Needle U-100 USE FIVE TIMES DAILY AS DIRECTED   lisinopril  5 MG tablet Commonly known as: ZESTRIL  Take 1 tablet (5 mg total) by mouth daily.   metFORMIN 1000 MG tablet Commonly known as: GLUCOPHAGE Take 1,000 mg by mouth 2 (two) times daily with a meal.   oxyCODONE  5 MG immediate release tablet Commonly known as: Oxy IR/ROXICODONE  Take 1 tablet (5 mg total) by mouth every 4 (four) hours as needed for moderate pain (pain score 4-6). SNF use only.  Refills per SNF MD   polyethylene glycol 17 g packet Commonly known as: MIRALAX  / GLYCOLAX  Take 17 g by mouth daily.               Discharge Care Instructions  (From admission, onward)           Start     Ordered   04/22/24 0000  Discharge wound care:       Comments: Wound care  Every shift      Comments: R foot: Cleanse the wound with Vashe #[id number], pat dry the periwound skin. Apply Medihoney to the wound bed. Apply thin layer of barrier cream on the hyperkeratosis. Cover the wound bed and the keratosis with the foam dressing. Change daily or PRN.    04/22/24 1409            Contact information for after-discharge care     Destination     [facility]   Service: Skilled Nursing Contact information: [address] [place]  [address] [phone]                    No Known Allergies  Consultations: Oncology Interventional radiology   Procedures/Studies: CT BONE TROCAR/NEEDLE BIOPSY DEEP Result Date: 04/20/2024 INDICATION: 73 year old male with multifocal lytic osseous lesions and a pathologic fracture of the right iliac bone. MRI demonstrates lesions throughout the skeleton. Due to patient's morbid obesity and difficulty with positioning, the only accessible biopsy location is the left iliac bone. EXAM: CT-guided bone biopsy TECHNIQUE: Multidetector CT imaging of the pelvis was  performed following the standard protocol without IV contrast. RADIATION DOSE REDUCTION: This exam was performed according to the departmental dose-optimization program which includes automated exposure control, adjustment of the mA and/or kV according to patient size and/or use of iterative reconstruction technique. MEDICATIONS: None. ANESTHESIA/SEDATION: None COMPLICATIONS: None immediate. PROCEDURE: Informed written consent was obtained from the patient after a thorough discussion of the procedural risks, benefits and alternatives. All questions were addressed. Maximal Sterile Barrier Technique was utilized including caps, mask, sterile gowns, sterile gloves, sterile drape, hand hygiene and skin antiseptic. A timeout was performed prior to the initiation of the procedure. A planning axial CT scan was performed. The left iliac bone was successfully identified. A suitable skin entry site was selected and marked. Local anesthesia was attained by infiltration with 1% lidocaine . A small dermatotomy was made. The 6 inch trocar needle was carefully advanced to the cortex of the left iliac bone. A core biopsy was then  obtained. The biopsy specimen was placed in formalin and delivered to pathology for further analysis. IMPRESSION: Successful CT-guided biopsy of the left iliac bone. Electronically Signed   By: Wilkie Lent M.D.   On: 04/20/2024 16:07   MR PELVIS W WO CONTRAST Result Date: 04/19/2024 CLINICAL DATA:  Bone mass or bone pain, pelvis, aggressive features on xray Right iliac bone fracture on recent CT with possible pathologic features. History of multiple medical problems, including diabetes and chronic renal insufficiency. No given history of malignancy. EXAM: MRI PELVIS WITHOUT AND WITH CONTRAST TECHNIQUE: Multiplanar multisequence MR imaging of the pelvis was performed both before and after administration of intravenous contrast. CONTRAST:  10mL GADAVIST  GADOBUTROL  1 MMOL/ML IV SOLN COMPARISON:  Pelvic CT 04/16/2024. FINDINGS: Technical note: Despite efforts by the technologist and patient, mild to moderate motion artifact is present on today's exam and could not be eliminated. This reduces exam sensitivity and specificity. Urinary Tract: The visualized distal ureters and bladder appear unremarkable. Bowel: No bowel wall thickening, distention or surrounding inflammation identified within the pelvis. Bowel assessment limited by motion. Vascular/Lymphatic: Multiple enlarged pelvic and inguinal lymph nodes bilaterally. There is a left pelvic sidewall node measuring up to 1.8 cm short axis on image 27/11. Largest right pelvic sidewall node measures 1.6 cm on image 28/11. Inguinal lymph nodes measure up to 1.1 cm short axis on the right (image 35/11). No acute vascular findings are identified. There is mild iliac atherosclerosis. Reproductive: Mild enlargement of the prostate gland with mild heterogeneous central gland enlargement. There is also a 1.9 cm in the posterior peripheral zone near the apex (image 39/11). Other: Retroperitoneal and perirectal edema without significant ascites. Musculoskeletal: As seen on  recent pelvic CT, there is a moderately displaced fracture of the right iliac bone. Between the displaced fracture fragments, there is a probable enhancing fluid collection which extends superiorly into the retroperitoneum, measuring approximately 6.1 x 13.5 x 4.6 cm. This likely represents a hematoma and may be slightly enlarged compared with the recent CT. This fracture is likely pathologic as there are innumerable enhancing osseous lesions consistent with widespread metastatic disease or multiple myeloma. Lesions are demonstrated within the lumbar spine, sacrum, both iliac bones, both pubic bones and both proximal femurs. Intra-articular, there are large lesions in both femoral necks and intertrochanteric regions which are prone to fracture. These measure up to 4.6 cm on the right and 3.0 cm on the left. In addition to the edema and enhancement surrounding the right iliac fracture, there is some edema within the proximal quadriceps musculature  bilaterally. No focal soft tissue masses or other pathologic fractures are identified. IMPRESSION: 1. 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 Large lesions in both femoral necks and intertrochanteric regions are prone to fracture. 2. Displaced pathologic fracture of the right iliac bone with probable slightly enlarged hematoma between the displaced fracture fragments. 3. Pelvic and inguinal adenopathy, likely metastatic. 4. Mild enlargement of the prostate gland with heterogeneous central gland enlargement and a 1.9 cm lesion in the posterior peripheral zone near the apex. Findings could certainly be secondary to metastatic prostate cancer, and the patient's serum PSA level was markedly elevated (1,123) on recent lab analysis. Electronically Signed   By: Elsie Perone M.D.   On: 04/19/2024 10:48   CT FEMUR RIGHT WO CONTRAST Result  Date: 04/16/2024 CLINICAL DATA:  Fall nonweightbearing EXAM: CT OF THE LOWER RIGHT EXTREMITY WITHOUT CONTRAST TECHNIQUE: Multidetector CT imaging of the right lower extremity was performed according to the standard protocol. RADIATION DOSE REDUCTION: This exam was performed according to the departmental dose-optimization program which includes automated exposure control, adjustment of the mA and/or kV according to patient size and/or use of iterative reconstruction technique. COMPARISON:  Radiograph 04/16/2024 FINDINGS: Bones/Joint/Cartilage Partially visualized acute comminuted and displaced right iliac wing fracture. Pubic symphysis and right rami appear intact. No fracture or malalignment involving the right femur. Moderate advanced arthritis at the right knee. No significant knee effusion. Ligaments Suboptimally assessed by CT. Muscles and Tendons No intramuscular fluid collections. Mild fatty atrophy of the hamstring muscles. Soft tissues Edema within the right gluteus and proximal hip muscles. Edema within the subcutaneous soft tissues lateral aspect of the right thigh and imaged portions of the proximal calf. Vascular calcifications. Mildly enlarged right inguinal nodes measuring up to 12 mm. IMPRESSION: 1. Partially visualized acute comminuted and displaced right iliac wing fracture. 2. No acute osseous abnormality of the right femur. 3. Edema within the right gluteus and proximal hip muscles, with edema in the lateral thigh soft tissues. 4. Mildly enlarged right inguinal nodes, nonspecific Electronically Signed   By: Luke Bun M.D.   On: 04/16/2024 22:53   CT PELVIS WO CONTRAST Result Date: 04/16/2024 CLINICAL DATA:  Fall, unable to bear weight EXAM: CT PELVIS WITHOUT CONTRAST TECHNIQUE: Multidetector CT imaging of the pelvis was performed following the standard protocol without intravenous contrast. RADIATION DOSE REDUCTION: This exam was performed according to the departmental dose-optimization  program which includes automated exposure control, adjustment of the mA and/or kV according to patient size and/or use of iterative reconstruction technique. COMPARISON:  Radiograph 04/16/2024 FINDINGS: Urinary Tract:  Urinary bladder is unremarkable Bowel:  No acute bowel wall thickening. Vascular/Lymphatic: No aneurysm.  No suspicious lymph nodes. Reproductive:  Enlarged prostate. Other:  Negative for pelvic effusion or free air. Musculoskeletal: Acute comminuted fracture involving the right iliac wing with displaced iliac crest fragment, the fragment is displaced posteriorly and laterally by about 2 cm. There is possible lucent change within the underlying bone raising possibility of pathologic fracture. Pubic symphysis appears intact. No rami fracture. The SI joints are non widened. There is considerable soft tissue stranding and contusion involving the right hip. Small volume right retroperitoneal and pelvic hematoma. IMPRESSION: 1. Acute comminuted and displaced fracture involving the right iliac wing with displaced iliac crest fragment. There is possible lucent change within the underlying bone raising possibility of pathologic fracture. 2. Considerable soft tissue stranding and contusion involving the  right hip. Small volume right retroperitoneal and pelvic hematoma. Electronically Signed   By: Luke Bun M.D.   On: 04/16/2024 22:43   DG Hip Unilat  With Pelvis 2-3 Views Right Result Date: 04/16/2024 EXAM: 2 or 3 VIEW(S) XRAY OF THE PELVIS AND RIGHT HIP 04/16/2024 01:33:00 PM COMPARISON: None available. CLINICAL HISTORY: Fall yesterday around 2pm at grocery store. Reports his right leg just gave out. Worsening right hip pain since fall. Non ambulatory since getting home from store. FINDINGS: JOINTS: SI joints are symmetric. No acute fracture. Bilateral hips demonstrate normal alignment. Mild bilateral hip osteoarthritis. SOFT TISSUES: The soft tissues are unremarkable. DISCS/DEGENERATIVE CHANGES:  Lumbar degenerative disc disease. IMPRESSION: 1. No acute abnormality of the right hip. 2. Mild bilateral hip osteoarthritis. Electronically signed by: Waddell Calk MD 04/16/2024 01:47 PM EDT RP Workstation: [id number]      Subjective: Seen and examined on the day of discharge.  Stable no distress.  Appropriate for discharge home.  Discharge Exam: Vitals:   04/22/24 0242 04/22/24 0724  BP: (!) 105/55 (!) 113/54  Pulse: 94 89  Resp: 18 16  Temp: 98.4 F (36.9 C) 98 F (36.7 C)  SpO2: 93% 92%   Vitals:   04/21/24 0946 04/21/24 2048 04/22/24 0242 04/22/24 0724  BP: (!) 114/57 130/63 (!) 105/55 (!) 113/54  Pulse: 86 (!) 105 94 89  Resp:  18 18 16   Temp: 98.2 F (36.8 C) 98.9 F (37.2 C) 98.4 F (36.9 C) 98 F (36.7 C)  TempSrc: Oral   Oral  SpO2: 92% 92% 93% 92%  Weight:      Height:        General: Pt is alert, awake, not in acute distress Cardiovascular: RRR, S1/S2 +, no rubs, no gallops Respiratory: CTA bilaterally, no wheezing, no rhonchi Abdominal: Soft, NT, ND, bowel sounds + Extremities: no edema, no cyanosis    The results of significant diagnostics from this hospitalization (including imaging, microbiology, ancillary and laboratory) are listed below for reference.     Microbiology: No results found for this or any previous visit (from the past 240 hours).   Labs: BNP (last 3 results) No results for input(s): BNP in the last 8760 hours. Basic Metabolic Panel: Recent Labs  Lab 04/16/24 1142 04/16/24 2313 04/17/24 0236 04/19/24 0619  NA 142 137 139 136  K 4.3 4.2 3.8 4.4  CL 105 105 106 101  CO2 26 24 25 25   GLUCOSE 229* 219* 185* 238*  BUN 22 23 21 22   CREATININE 0.97 0.92 0.82 1.00  CALCIUM  9.4 8.9 8.9 8.9   Liver Function Tests: Recent Labs  Lab 04/16/24 1142 04/17/24 0236  AST 23 19  ALT 13 10  ALKPHOS 85 72  BILITOT 0.7 0.9  PROT 7.8 7.3  ALBUMIN 3.5 3.2*   No results for input(s): LIPASE, AMYLASE in the last 168 hours. No  results for input(s): AMMONIA in the last 168 hours. CBC: Recent Labs  Lab 04/16/24 1142 04/16/24 2313 04/17/24 0236 04/19/24 0619 04/20/24 0532  WBC 5.8 6.0 5.4 5.3 4.7  NEUTROABS 4.2 4.5  --   --   --   HGB 9.9* 8.9* 8.7* 8.4* 7.9*  HCT 31.1* 27.2* 26.5* 25.4* 24.7*  MCV 89.9 90.1 88.3 88.2 89.8  PLT 276 225 243 243 274   Cardiac Enzymes: No results for input(s): CKTOTAL, CKMB, CKMBINDEX, TROPONINI in the last 168 hours. BNP: Invalid input(s): POCBNP CBG: Recent Labs  Lab 04/21/24 1116 04/21/24 1653 04/21/24 2202 04/22/24 0717 04/22/24  1152  GLUCAP 236* 203* 305* 174* 198*   D-Dimer No results for input(s): DDIMER in the last 72 hours. Hgb A1c No results for input(s): HGBA1C in the last 72 hours. Lipid Profile No results for input(s): CHOL, HDL, LDLCALC, TRIG, CHOLHDL, LDLDIRECT in the last 72 hours. Thyroid function studies No results for input(s): TSH, T4TOTAL, T3FREE, THYROIDAB in the last 72 hours.  Invalid input(s): FREET3 Anemia work up No results for input(s): VITAMINB12, FOLATE, FERRITIN, TIBC, IRON , RETICCTPCT in the last 72 hours. Urinalysis    Component Value Date/Time   COLORURINE AMBER (A) 10/03/2016 1342   APPEARANCEUR TURBID (A) 10/03/2016 1342   LABSPEC 1.026 10/03/2016 1342   PHURINE 5.0 10/03/2016 1342   GLUCOSEU 50 (A) 10/03/2016 1342   HGBUR SMALL (A) 10/03/2016 1342   BILIRUBINUR NEGATIVE 10/03/2016 1342   KETONESUR NEGATIVE 10/03/2016 1342   PROTEINUR 100 (A) 10/03/2016 1342   NITRITE NEGATIVE 10/03/2016 1342   LEUKOCYTESUR NEGATIVE 10/03/2016 1342   Sepsis Labs Recent Labs  Lab 04/16/24 2313 04/17/24 0236 04/19/24 0619 04/20/24 0532  WBC 6.0 5.4 5.3 4.7   Microbiology No results found for this or any previous visit (from the past 240 hours).   Time coordinating discharge: 40 minutes  SIGNED:   Calvin KATHEE Robson, MD  [facility] 04/23/2024 Pager   If  7PM-7AM, please contact night-coverage

## 2024-04-22 NOTE — Plan of Care (Signed)
   Problem: Fluid Volume: Goal: Ability to maintain a balanced intake and output will improve Outcome: Progressing   Problem: Education: Goal: Knowledge of General Education information will improve Description: Including pain rating scale, medication(s)/side effects and non-pharmacologic comfort measures Outcome: Progressing   Problem: Pain Managment: Goal: General experience of comfort will improve and/or be controlled Outcome: Progressing

## 2024-04-23 DIAGNOSIS — S72009A Fracture of unspecified part of neck of unspecified femur, initial encounter for closed fracture: Secondary | ICD-10-CM | POA: Diagnosis not present

## 2024-04-23 LAB — GLUCOSE, CAPILLARY: Glucose-Capillary: 157 mg/dL — ABNORMAL HIGH (ref 70–99)

## 2024-04-23 MED ORDER — SODIUM CHLORIDE 0.9 % IV SOLN: INTRAVENOUS | Status: DC

## 2024-04-23 NOTE — Plan of Care (Signed)

## 2024-04-23 NOTE — Progress Notes (Signed)
 Gave report to charge nurse at [facility]. Charge Nurse acknowledged understanding. Patient transported to [facility] via [facility].

## 2024-04-25 LAB — SURGICAL PATHOLOGY

## 2024-05-03 ENCOUNTER — Inpatient Hospital Stay

## 2024-05-03 ENCOUNTER — Encounter: Payer: Self-pay | Admitting: Internal Medicine

## 2024-05-03 ENCOUNTER — Inpatient Hospital Stay: Attending: Internal Medicine | Admitting: Internal Medicine

## 2024-05-03 VITALS — BP 111/62 | HR 101 | Temp 99.1°F | Resp 16 | Ht 71.0 in | Wt 289.0 lb

## 2024-05-03 DIAGNOSIS — D649 Anemia, unspecified: Secondary | ICD-10-CM | POA: Diagnosis not present

## 2024-05-03 DIAGNOSIS — G893 Neoplasm related pain (acute) (chronic): Secondary | ICD-10-CM | POA: Diagnosis not present

## 2024-05-03 DIAGNOSIS — C61 Malignant neoplasm of prostate: Secondary | ICD-10-CM

## 2024-05-03 DIAGNOSIS — C7951 Secondary malignant neoplasm of bone: Secondary | ICD-10-CM | POA: Insufficient documentation

## 2024-05-03 DIAGNOSIS — E1122 Type 2 diabetes mellitus with diabetic chronic kidney disease: Secondary | ICD-10-CM | POA: Insufficient documentation

## 2024-05-03 DIAGNOSIS — D509 Iron deficiency anemia, unspecified: Secondary | ICD-10-CM

## 2024-05-03 DIAGNOSIS — N1831 Chronic kidney disease, stage 3a: Secondary | ICD-10-CM | POA: Insufficient documentation

## 2024-05-03 DIAGNOSIS — I739 Peripheral vascular disease, unspecified: Secondary | ICD-10-CM | POA: Insufficient documentation

## 2024-05-03 LAB — CBC WITH DIFFERENTIAL (CANCER CENTER ONLY)
Abs Immature Granulocytes: 0.02 K/uL (ref 0.00–0.07)
Basophils Absolute: 0 K/uL (ref 0.0–0.1)
Basophils Relative: 0 %
Eosinophils Absolute: 0.1 K/uL (ref 0.0–0.5)
Eosinophils Relative: 2 %
HCT: 29.9 % — ABNORMAL LOW (ref 39.0–52.0)
Hemoglobin: 9.6 g/dL — ABNORMAL LOW (ref 13.0–17.0)
Immature Granulocytes: 0 %
Lymphocytes Relative: 14 %
Lymphs Abs: 0.8 K/uL (ref 0.7–4.0)
MCH: 28.9 pg (ref 26.0–34.0)
MCHC: 32.1 g/dL (ref 30.0–36.0)
MCV: 90.1 fL (ref 80.0–100.0)
Monocytes Absolute: 0.6 K/uL (ref 0.1–1.0)
Monocytes Relative: 10 %
Neutro Abs: 4.3 K/uL (ref 1.7–7.7)
Neutrophils Relative %: 74 %
Platelet Count: 286 K/uL (ref 150–400)
RBC: 3.32 MIL/uL — ABNORMAL LOW (ref 4.22–5.81)
RDW: 15.7 % — ABNORMAL HIGH (ref 11.5–15.5)
WBC Count: 5.9 K/uL (ref 4.0–10.5)
nRBC: 0 % (ref 0.0–0.2)

## 2024-05-03 LAB — SAMPLE TO BLOOD BANK

## 2024-05-03 LAB — CMP (CANCER CENTER ONLY)
ALT: 12 U/L (ref 0–44)
AST: 18 U/L (ref 15–41)
Albumin: 3.5 g/dL (ref 3.5–5.0)
Alkaline Phosphatase: 155 U/L — ABNORMAL HIGH (ref 38–126)
Anion gap: 8 (ref 5–15)
BUN: 24 mg/dL — ABNORMAL HIGH (ref 8–23)
CO2: 25 mmol/L (ref 22–32)
Calcium: 8.9 mg/dL (ref 8.9–10.3)
Chloride: 97 mmol/L — ABNORMAL LOW (ref 98–111)
Creatinine: 0.99 mg/dL (ref 0.61–1.24)
GFR, Estimated: >60 mL/min (ref >60)
Glucose, Bld: 145 mg/dL — ABNORMAL HIGH (ref 70–99)
Potassium: 5.2 mmol/L — ABNORMAL HIGH (ref 3.5–5.1)
Sodium: 130 mmol/L — ABNORMAL LOW (ref 135–145)
Total Bilirubin: 0.8 mg/dL (ref 0.0–1.2)
Total Protein: 8.3 g/dL — ABNORMAL HIGH (ref 6.5–8.1)

## 2024-05-03 MED ORDER — DEGARELIX ACETATE(240 MG DOSE) 120 MG/VIAL ~~LOC~~ SOLR
240.0000 mg | Freq: Once | SUBCUTANEOUS | Status: AC
  Administered 2024-05-03: 240 mg via SUBCUTANEOUS
  Filled 2024-05-03: qty 6

## 2024-05-03 NOTE — Assessment & Plan Note (Addendum)
#   AUG 2025- [hospital]- Metastatic prostate cancer-castrate sensitive de novo; high volume- order Tempus. Bx  IR:  METASTIC PROSTATE ADENOCARCINOMA  #I had a long discussion regarding the concerning findings progressive disease.  Again discussed at length the importance of starting ADT-to treat his progressive prostate cancer.  I reviewed the mechanism of action of ADT/blocking testosterone.  Again reviewed the potential side effects including but not limited to-fatigue hot flashes loss of libido.    # Also reviewed that bone health/cardiovascular as long-term complications.  I would recommend Firmagon .  Understands treatments are palliative not curative.   # Patient has high risk- high-volume disease-however poor candidate for Taxotere chemotherapy.  I discussed other options including androgen receptor blockers-in addition to ADT.  Patient is a borderline candidate for Zytiga given the need for steroids and also history of diabetes on insulin .  Discussed with pharmacy.  Recommended PET scan for further evaluation.  # Anemia-likely secondary to anemia of chronic disease/underlying prostate cancer.  # Multiple bone metastases-ongoing pain high risk for pathologic fracture.  Will refer to radiation oncology.   # Chronic kidney disease stage IIIa Stable   #  Peripheral vascular disease: Needing wound care stable.    # Diabetes monitor closely on current therapy.  # ACP-recommend evaluation with Josh Borders next visit.  I tried to reach the patient's sister Inocente but unavailable.   # DISPOSITION;  # labs today # Firmagon  today # PET scan # referral to radiatio oncology re: prostate cancer- mets to bone # referaal to Josh at next visit re: palliative care # follow up in 4 weeks- MD; labs- cbc/cmp;psa; firmagon - Dr.B  # 40 minutes face-to-face with the patient discussing the above plan of care; more than 50% of time spent on prognosis/ natural history; counseling and coordination.

## 2024-05-03 NOTE — Progress Notes (Signed)
 [name] CONSULT NOTE  Patient Care Team: Adamo, Buel HERO, MD as PCP - General (Family Medicine) [name], MD as Consulting Physician (Oncology) Lenn Aran, MD as Consulting Physician (Radiation Oncology)  CHIEF COMPLAINTS/PURPOSE OF CONSULTATION: Prostate cancer  Oncology History  Prostate cancer metastatic to multiple sites [facility])  04/20/2024 Initial Diagnosis   Prostate cancer metastatic to multiple sites [facility])   05/10/2024 Cancer Staging   Staging form: Prostate, AJCC 8th Edition - Clinical: Stage IVB (cTX, cNX, pM1b) - Signed by [name], MD on 05/10/2024     Latest Reference Range & Units 04/17/24 02:36  Prostatic Specific Antigen 0.00 - 4.00 ng/mL 1,123.11 (H)  (H): Data is abnormally high  HISTORY OF PRESENTING ILLNESS: Patient ambulating- independently.   Alone.   [name] 73 y.o.  male pleasant patient with a chronic right foot ulcer, CKD IIIa, HTN, HLD, MO, DM and anemia who presented after a fall in a [employer] parking lot, initially seen in the ER for hip pain, radiographs normal, discharged, then returned and CT showed a iliac crest fracture. Ortho consulted, confirmed that this was indeed a nonoperative fracture, patient reported he could not walk and so was kept for SNF placement. Incidentally, CT was suspicious for pathologic fracture and PSA was markedly elevated.   Patient subsequently underwent a biopsy of his bone lesion.  Patient is here to review the results of his pathology.  Review of Systems  Constitutional:  Positive for malaise/fatigue. Negative for chills, diaphoresis, fever and weight loss.  HENT:  Negative for nosebleeds and sore throat.   Eyes:  Negative for double vision.  Respiratory:  Negative for cough, hemoptysis, sputum production, shortness of breath and wheezing.   Cardiovascular:  Negative for chest pain, palpitations, orthopnea and leg swelling.  Gastrointestinal:  Negative for abdominal  pain, blood in stool, constipation, diarrhea, heartburn, melena, nausea and vomiting.  Genitourinary:  Negative for dysuria, frequency and urgency.  Musculoskeletal:  Positive for back pain and joint pain.  Skin: Negative.  Negative for itching and rash.  Neurological:  Negative for dizziness, tingling, focal weakness, weakness and headaches.  Endo/Heme/Allergies:  Does not bruise/bleed easily.  Psychiatric/Behavioral:  Negative for depression. The patient is not nervous/anxious and does not have insomnia.     MEDICAL HISTORY:  Past Medical History:  Diagnosis Date   Arthritis    Bladder incontinence    Chronic kidney disease    Stage 3 Kidney disease   Diabetes mellitus without complication (HCC)    DVT of leg (deep venous thrombosis) (HCC)    H/O RIGHT LEG 2018   Dyspnea    occassional   Hyperlipidemia    Hypertension    Peripheral vascular disease (HCC)    Sepsis (HCC)     SURGICAL HISTORY: Past Surgical History:  Procedure Laterality Date   ABDOMINAL AORTOGRAM W/LOWER EXTREMITY N/A 10/10/2016   Procedure: Abdominal Aortogram w/Lower Extremity;  Surgeon: Cordella KANDICE Shawl, MD;  Location: [facility];  Service: Cardiovascular;  Laterality: N/A;   CATARACT EXTRACTION W/PHACO Left 05/27/2018   Procedure: CATARACT EXTRACTION PHACO AND INTRAOCULAR LENS PLACEMENT (IOC);  Surgeon: Myrna Adine Anes, MD;  Location: [facility] ORS;  Service: Ophthalmology;  Laterality: Left;  US  03:06.2 AP% 16.1 CDE 30.16 FLUID PACK LOT @ [id number] H   CATARACT EXTRACTION W/PHACO Right 07/13/2023   Procedure: CATARACT EXTRACTION PHACO AND INTRAOCULAR LENS PLACEMENT (IOC) RIGHT DIABETIC 4.00 00:36.6;  Surgeon: Myrna Adine Anes, MD;  Location: [facility];  Service: Ophthalmology;  Laterality: Right;   CIRCUMCISION  1998   COLONOSCOPY WITH PROPOFOL  N/A 08/20/2023   Procedure: COLONOSCOPY WITH PROPOFOL ;  Surgeon: Unk Corinn Skiff, MD;  Location: [facility] END[facility];  Service: Gastroenterology;   Laterality: N/A;   EYE SURGERY Left    Retina Detachment   GRAFT APPLICATION Right 05/29/2017   Procedure: GRAFT APPLICATION ( RIGHT FOOT );  Surgeon: Jama Cordella MATSU, MD;  Location: [facility] ORS;  Service: Vascular;  Laterality: Right;  graft taken from patients right thigh   IRRIGATION AND DEBRIDEMENT FOOT Right 10/03/2016   Procedure: IRRIGATION AND DEBRIDEMENT FOOT;  Surgeon: Eva Gay, DPM;  Location: [facility] OR[facility];  Service: Podiatry;  Laterality: Right;   LOWER EXTREMITY ANGIOGRAPHY Right 02/24/2023   Procedure: Lower Extremity Angiography;  Surgeon: Jama Cordella MATSU, MD;  Location: [facility];  Service: Cardiovascular;  Laterality: Right;   LOWER EXTREMITY INTERVENTION  10/10/2016   Procedure: Lower Extremity Intervention;  Surgeon: Cordella MATSU Jama, MD;  Location: [facility];  Service: Cardiovascular;;   PERIPHERAL VASCULAR BALLOON ANGIOPLASTY Left 10/10/2016   Procedure: Peripheral Vascular Balloon Angioplasty;  Surgeon: Cordella MATSU Jama, MD;  Location: [facility];  Service: Cardiovascular;  Laterality: Left;   TONSILLECTOMY     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/2022   Procedure: TRANSMETATARSAL AMPUTATION;  Surgeon: Gay Eva, DPM;  Location: [facility];  Service: Podiatry;  Laterality: Right;   WOUND DEBRIDEMENT Right 11/07/2016   Procedure: DEBRIDEMENT OF WOUND AND BONE RIGHT FOOT AND APPLY WOUND VAC;  Surgeon: Eva Gay, DPM;  Location: [facility];  Service: Podiatry;  Laterality: Right;    SOCIAL HISTORY: Social History   Socioeconomic History   Marital status: Single    Spouse name: Not on file   Number of children: Not on file   Years of education: Not on file   Highest education level: Not on file  Occupational History   Not on file  Tobacco Use   Smoking status: Former    Types: Pipe, Cigarettes    Quit date: 05/23/1975    Years since quitting: 49.0   Smokeless tobacco: Never  Vaping Use   Vaping status: Never Used  Substance and  Sexual Activity   Alcohol use: No   Drug use: No   Sexual activity: Not on file  Other Topics Concern   Not on file  Social History Narrative   Not on file   Social Drivers of Health   Financial Resource Strain: Low Risk  (08/28/2023)   Received from [facility]   Overall Financial Resource Strain (CARDIA)    Difficulty of Paying Living Expenses: Not hard at all  Recent Concern: Financial Resource Strain - Medium Risk (08/06/2023)   Received from [facility]   Overall Financial Resource Strain (CARDIA)    Difficulty of Paying Living Expenses: Somewhat hard  Food Insecurity: No Food Insecurity (05/03/2024)   Hunger Vital Sign    Worried About Running Out of Food in the Last Year: Never true    Ran Out of Food in the Last Year: Never true  Transportation Needs: No Transportation Needs (05/03/2024)   PRAPARE - Administrator, Civil Service (Medical): No    Lack of Transportation (Non-Medical): No  Physical Activity: Inactive (08/28/2023)   Received from [facility]   Exercise Vital Sign    On average, how many days per week do you engage in moderate to strenuous exercise (like a brisk walk)?: 0  days    On average, how many minutes do you engage in exercise at this level?: 0 min  Stress: No Stress Concern Present (08/28/2023)   Received from [facility] - Occupational Stress Questionnaire    Feeling of Stress : Not at all  Social Connections: Moderately Integrated (04/17/2024)   Social Connection and Isolation Panel    Frequency of Communication with Friends and Family: Three times a week    Frequency of Social Gatherings with Friends and Family: Three times a week    Attends Religious Services: 1 to 4 times per year    Active Member of Clubs or Organizations: Yes    Attends Banker Meetings: 1 to 4 times per year    Marital Status: Never married   Intimate Partner Violence: Not At Risk (05/03/2024)   Humiliation, Afraid, Rape, and Kick questionnaire    Fear of Current or Ex-Partner: No    Emotionally Abused: No    Physically Abused: No    Sexually Abused: No    FAMILY HISTORY: Family History  Problem Relation Age of Onset   Diabetes Mother    Hypertension Mother    Diabetes Father    Hypertension Father     ALLERGIES:  is allergic to sulfa antibiotics and piperacillin -tazobactam in dex.  MEDICATIONS:  Current Outpatient Medications  Medication Sig Dispense Refill   ACCU-CHEK GUIDE test strip Use 1 strip via meter four times a day  to monitor blood glucose     Accu-Chek Softclix Lancets lancets      acetaminophen  (TYLENOL ) 325 MG tablet Take 650 mg by mouth every 6 (six) hours as needed.     aspirin  EC 81 MG tablet Take 81 mg by mouth daily. Swallow whole.     atorvastatin  (LIPITOR) 10 MG tablet Take 10 mg by mouth every evening.     cyanocobalamin  1000 MCG tablet Take 1 tablet (1,000 mcg total) by mouth daily.     DROPLET INSULIN  SYRINGE 31G X 5/16 0.3 ML MISC USE FIVE TIMES DAILY AS DIRECTED     FEROSUL 325 (65 Fe) MG tablet Take 325 mg by mouth 2 (two) times daily with a meal.     furosemide (LASIX) 20 MG tablet Take 20 mg by mouth daily.     glucose blood (ACCU-CHEK AVIVA PLUS) test strip      insulin  glargine (LANTUS ) 100 UNIT/ML injection Inject 0.15 mLs (15 Units total) into the skin at bedtime. 10 mL 11   insulin  lispro (HUMALOG) 100 UNIT/ML injection Inject 4 Units into the skin See admin instructions. Inject 4 units SQ with breakfast, inject 4 units SQ with lunch and inject 4 units SQ with dinner     Insulin  Syringe-Needle U-100 31G X 5/16 0.3 ML MISC      lisinopril  (ZESTRIL ) 5 MG tablet Take 1 tablet (5 mg total) by mouth daily. 30 tablet 3   metFORMIN (GLUCOPHAGE) 1000 MG tablet Take 1,000 mg by mouth 2 (two) times daily with a meal.     ondansetron  (ZOFRAN ) 4 MG tablet Take 4 mg by mouth every 8 (eight)  hours as needed for nausea or vomiting.     oxyCODONE  (OXY IR/ROXICODONE ) 5 MG immediate release tablet Take 1 tablet (5 mg total) by mouth every 4 (four) hours as needed for moderate pain (pain score 4-6). SNF use only.  Refills per SNF MD 10 tablet 0   polyethylene glycol (MIRALAX  / GLYCOLAX ) packet Take  17 g by mouth daily.     senna (SENOKOT) 8.6 MG TABS tablet Take 1 tablet by mouth 2 (two) times daily.     Simethicone  (GAS-X ULTRA STRENGTH PO) Take 1 capsule by mouth 4 (four) times daily as needed (for flatulence).     Wound Cleansers (VASHE WOUND THERAPY EX) Apply 1 Application topically daily after breakfast.     No current facility-administered medications for this visit.    PHYSICAL EXAMINATION:   Vitals:   05/03/24 1352  BP: 111/62  Pulse: (!) 101  Resp: 16  Temp: 99.1 F (37.3 C)  SpO2: 99%   Filed Weights   05/03/24 1352  Weight: 289 lb (131.1 kg)    Physical Exam Vitals and nursing note reviewed.  HENT:     Head: Normocephalic and atraumatic.     Mouth/Throat:     Pharynx: Oropharynx is clear.  Eyes:     Extraocular Movements: Extraocular movements intact.     Pupils: Pupils are equal, round, and reactive to light.  Cardiovascular:     Rate and Rhythm: Normal rate and regular rhythm.  Pulmonary:     Comments: Decreased breath sounds bilaterally.  Abdominal:     Palpations: Abdomen is soft.  Musculoskeletal:        General: Normal range of motion.     Cervical back: Normal range of motion.  Skin:    General: Skin is warm.  Neurological:     General: No focal deficit present.     Mental Status: He is alert and oriented to person, place, and time.  Psychiatric:        Behavior: Behavior normal.        Judgment: Judgment normal.     LABORATORY DATA:  I have reviewed the data as listed Lab Results  Component Value Date   WBC 5.9 05/03/2024   HGB 9.6 (L) 05/03/2024   HCT 29.9 (L) 05/03/2024   MCV 90.1 05/03/2024   PLT 286 05/03/2024   Recent  Labs    04/16/24 1142 04/16/24 2313 04/17/24 0236 04/19/24 0619 05/03/24 1331  NA 142   < > 139 136 130*  K 4.3   < > 3.8 4.4 5.2*  CL 105   < > 106 101 97*  CO2 26   < > 25 25 25   GLUCOSE 229*   < > 185* 238* 145*  BUN 22   < > 21 22 24*  CREATININE 0.97   < > 0.82 1.00 0.99  CALCIUM  9.4   < > 8.9 8.9 8.9  GFRNONAA >60   < > >60 >60 >60  PROT 7.8  --  7.3  --  8.3*  ALBUMIN 3.5  --  3.2*  --  3.5  AST 23  --  19  --  18  ALT 13  --  10  --  12  ALKPHOS 85  --  72  --  155*  BILITOT 0.7  --  0.9  --  0.8   < > = values in this interval not displayed.    RADIOGRAPHIC STUDIES: I have personally reviewed the radiological images as listed and agreed with the findings in the report. CT BONE TROCAR/NEEDLE BIOPSY DEEP Result Date: 04/20/2024 INDICATION: 72 year old male with multifocal lytic osseous lesions and a pathologic fracture of the right iliac bone. MRI demonstrates lesions throughout the skeleton. Due to patient's morbid obesity and difficulty with positioning, the only accessible biopsy location is the left iliac bone. EXAM: CT-guided bone  biopsy TECHNIQUE: Multidetector CT imaging of the pelvis was performed following the standard protocol without IV contrast. RADIATION DOSE REDUCTION: This exam was performed according to the departmental dose-optimization program which includes automated exposure control, adjustment of the mA and/or kV according to patient size and/or use of iterative reconstruction technique. MEDICATIONS: None. ANESTHESIA/SEDATION: None COMPLICATIONS: None immediate. PROCEDURE: Informed written consent was obtained from the patient after a thorough discussion of the procedural risks, benefits and alternatives. All questions were addressed. Maximal Sterile Barrier Technique was utilized including caps, mask, sterile gowns, sterile gloves, sterile drape, hand hygiene and skin antiseptic. A timeout was performed prior to the initiation of the procedure. A planning  axial CT scan was performed. The left iliac bone was successfully identified. A suitable skin entry site was selected and marked. Local anesthesia was attained by infiltration with 1% lidocaine . A small dermatotomy was made. The 6 inch trocar needle was carefully advanced to the cortex of the left iliac bone. A core biopsy was then obtained. The biopsy specimen was placed in formalin and delivered to pathology for further analysis. IMPRESSION: Successful CT-guided biopsy of the left iliac bone. Electronically Signed   By: Wilkie Lent M.D.   On: 04/20/2024 16:07   MR PELVIS W WO CONTRAST Result Date: 04/19/2024 CLINICAL DATA:  Bone mass or bone pain, pelvis, aggressive features on xray Right iliac bone fracture on recent CT with possible pathologic features. History of multiple medical problems, including diabetes and chronic renal insufficiency. No given history of malignancy. EXAM: MRI PELVIS WITHOUT AND WITH CONTRAST TECHNIQUE: Multiplanar multisequence MR imaging of the pelvis was performed both before and after administration of intravenous contrast. CONTRAST:  10mL GADAVIST  GADOBUTROL  1 MMOL/ML IV SOLN COMPARISON:  Pelvic CT 04/16/2024. FINDINGS: Technical note: Despite efforts by the technologist and patient, mild to moderate motion artifact is present on today's exam and could not be eliminated. This reduces exam sensitivity and specificity. Urinary Tract: The visualized distal ureters and bladder appear unremarkable. Bowel: No bowel wall thickening, distention or surrounding inflammation identified within the pelvis. Bowel assessment limited by motion. Vascular/Lymphatic: Multiple enlarged pelvic and inguinal lymph nodes bilaterally. There is a left pelvic sidewall node measuring up to 1.8 cm short axis on image 27/11. Largest right pelvic sidewall node measures 1.6 cm on image 28/11. Inguinal lymph nodes measure up to 1.1 cm short axis on the right (image 35/11). No acute vascular findings are  identified. There is mild iliac atherosclerosis. Reproductive: Mild enlargement of the prostate gland with mild heterogeneous central gland enlargement. There is also a 1.9 cm in the posterior peripheral zone near the apex (image 39/11). Other: Retroperitoneal and perirectal edema without significant ascites. Musculoskeletal: As seen on recent pelvic CT, there is a moderately displaced fracture of the right iliac bone. Between the displaced fracture fragments, there is a probable enhancing fluid collection which extends superiorly into the retroperitoneum, measuring approximately 6.1 x 13.5 x 4.6 cm. This likely represents a hematoma and may be slightly enlarged compared with the recent CT. This fracture is likely pathologic as there are innumerable enhancing osseous lesions consistent with widespread metastatic disease or multiple myeloma. Lesions are demonstrated within the lumbar spine, sacrum, both iliac bones, both pubic bones and both proximal femurs. Intra-articular, there are large lesions in both femoral necks and intertrochanteric regions which are prone to fracture. These measure up to 4.6 cm on the right and 3.0 cm on the left. In addition to the edema and enhancement surrounding the right iliac fracture,  there is some edema within the proximal quadriceps musculature bilaterally. No focal soft tissue masses or other pathologic fractures are identified. IMPRESSION: 1. 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 Large lesions in both femoral necks and intertrochanteric regions are prone to fracture. 2. Displaced pathologic fracture of the right iliac bone with probable slightly enlarged hematoma between the displaced fracture fragments. 3. Pelvic and inguinal adenopathy, likely metastatic. 4. Mild enlargement of the prostate gland with heterogeneous central gland enlargement  and a 1.9 cm lesion in the posterior peripheral zone near the apex. Findings could certainly be secondary to metastatic prostate cancer, and the patient's serum PSA level was markedly elevated (1,123) on recent lab analysis. Electronically Signed   By: Elsie Perone M.D.   On: 04/19/2024 10:48   CT FEMUR RIGHT WO CONTRAST Result Date: 04/16/2024 CLINICAL DATA:  Fall nonweightbearing EXAM: CT OF THE LOWER RIGHT EXTREMITY WITHOUT CONTRAST TECHNIQUE: Multidetector CT imaging of the right lower extremity was performed according to the standard protocol. RADIATION DOSE REDUCTION: This exam was performed according to the departmental dose-optimization program which includes automated exposure control, adjustment of the mA and/or kV according to patient size and/or use of iterative reconstruction technique. COMPARISON:  Radiograph 04/16/2024 FINDINGS: Bones/Joint/Cartilage Partially visualized acute comminuted and displaced right iliac wing fracture. Pubic symphysis and right rami appear intact. No fracture or malalignment involving the right femur. Moderate advanced arthritis at the right knee. No significant knee effusion. Ligaments Suboptimally assessed by CT. Muscles and Tendons No intramuscular fluid collections. Mild fatty atrophy of the hamstring muscles. Soft tissues Edema within the right gluteus and proximal hip muscles. Edema within the subcutaneous soft tissues lateral aspect of the right thigh and imaged portions of the proximal calf. Vascular calcifications. Mildly enlarged right inguinal nodes measuring up to 12 mm. IMPRESSION: 1. Partially visualized acute comminuted and displaced right iliac wing fracture. 2. No acute osseous abnormality of the right femur. 3. Edema within the right gluteus and proximal hip muscles, with edema in the lateral thigh soft tissues. 4. Mildly enlarged right inguinal nodes, nonspecific Electronically Signed   By: Luke Bun M.D.   On: 04/16/2024 22:53   CT PELVIS WO  CONTRAST Result Date: 04/16/2024 CLINICAL DATA:  Fall, unable to bear weight EXAM: CT PELVIS WITHOUT CONTRAST TECHNIQUE: Multidetector CT imaging of the pelvis was performed following the standard protocol without intravenous contrast. RADIATION DOSE REDUCTION: This exam was performed according to the departmental dose-optimization program which includes automated exposure control, adjustment of the mA and/or kV according to patient size and/or use of iterative reconstruction technique. COMPARISON:  Radiograph 04/16/2024 FINDINGS: Urinary Tract:  Urinary bladder is unremarkable Bowel:  No acute bowel wall thickening. Vascular/Lymphatic: No aneurysm.  No suspicious lymph nodes. Reproductive:  Enlarged prostate. Other:  Negative for pelvic effusion or free air. Musculoskeletal: Acute comminuted fracture involving the right iliac wing with displaced iliac crest fragment, the fragment is displaced posteriorly and laterally by about 2 cm. There is possible lucent change within the underlying bone raising possibility of pathologic fracture. Pubic symphysis appears intact. No rami fracture. The SI joints are non widened. There is considerable soft tissue stranding and contusion involving the right hip. Small volume right retroperitoneal and pelvic hematoma. IMPRESSION: 1. Acute comminuted and displaced fracture involving the right iliac wing with displaced iliac crest fragment. There is possible lucent change within the underlying bone raising possibility of pathologic fracture.  2. Considerable soft tissue stranding and contusion involving the right hip. Small volume right retroperitoneal and pelvic hematoma. Electronically Signed   By: Luke Bun M.D.   On: 04/16/2024 22:43   DG Hip Unilat  With Pelvis 2-3 Views Right Result Date: 04/16/2024 EXAM: 2 or 3 VIEW(S) XRAY OF THE PELVIS AND RIGHT HIP 04/16/2024 01:33:00 PM COMPARISON: None available. CLINICAL HISTORY: Fall yesterday around 2pm at grocery store. Reports  his right leg just gave out. Worsening right hip pain since fall. Non ambulatory since getting home from store. FINDINGS: JOINTS: SI joints are symmetric. No acute fracture. Bilateral hips demonstrate normal alignment. Mild bilateral hip osteoarthritis. SOFT TISSUES: The soft tissues are unremarkable. DISCS/DEGENERATIVE CHANGES: Lumbar degenerative disc disease. IMPRESSION: 1. No acute abnormality of the right hip. 2. Mild bilateral hip osteoarthritis. Electronically signed by: Waddell Calk MD 04/16/2024 01:47 PM EDT RP Workstation: [id number]     Prostate cancer metastatic to multiple sites [facility]) # AUG 2025- [hospital]- Metastatic prostate cancer-castrate sensitive de novo; high volume- order Tempus. Bx  IR:  METASTIC PROSTATE ADENOCARCINOMA  #I had a long discussion regarding the concerning findings progressive disease.  Again discussed at length the importance of starting ADT-to treat his progressive prostate cancer.  I reviewed the mechanism of action of ADT/blocking testosterone.  Again reviewed the potential side effects including but not limited to-fatigue hot flashes loss of libido.    # Also reviewed that bone health/cardiovascular as long-term complications.  I would recommend Firmagon .  Understands treatments are palliative not curative.   # Patient has high risk- high-volume disease-however poor candidate for Taxotere chemotherapy.  I discussed other options including androgen receptor blockers-in addition to ADT.  Patient is a borderline candidate for Zytiga given the need for steroids and also history of diabetes on insulin .  Discussed with pharmacy.  Recommended PET scan for further evaluation.  # Anemia-likely secondary to anemia of chronic disease/underlying prostate cancer.  # Multiple bone metastases-ongoing pain high risk for pathologic fracture.  Will refer to radiation oncology.   # Chronic kidney disease stage IIIa Stable   #  Peripheral vascular disease: Needing wound  care stable.    # Diabetes monitor closely on current therapy.  # ACP-recommend evaluation with Josh Borders next visit.  I tried to reach the patient's sister Inocente but unavailable.   # DISPOSITION;  # labs today # Firmagon  today # PET scan # referral to radiatio oncology re: prostate cancer- mets to bone # referaal to Josh at next visit re: palliative care # follow up in 4 weeks- MD; labs- cbc/cmp;psa; firmagon - Dr.B  # 40 minutes face-to-face with the patient discussing the above plan of care; more than 50% of time spent on prognosis/ natural history; counseling and coordination.   Above plan of care was discussed with patient/family in detail.  My contact information was given to the patient/family.     Cindy JONELLE Joe, MD 05/11/2024 1:14 PM

## 2024-05-04 ENCOUNTER — Inpatient Hospital Stay

## 2024-05-11 ENCOUNTER — Encounter: Payer: Self-pay | Admitting: Internal Medicine

## 2024-05-16 ENCOUNTER — Telehealth: Payer: Self-pay | Admitting: Internal Medicine

## 2024-05-16 NOTE — Telephone Encounter (Signed)
 Per secure chat- called to cancel patient appointment for PET Scan 9/16 due to no insurance authorization. Spoke to [employer] who stated patient is at [facility] and she was notified has COVID and was not going to make it to appointment anyway.

## 2024-05-17 ENCOUNTER — Ambulatory Visit: Admit: 2024-05-17 | Admitting: Gastroenterology

## 2024-05-17 ENCOUNTER — Ambulatory Visit

## 2024-05-17 SURGERY — COLONOSCOPY
Anesthesia: General

## 2024-05-23 ENCOUNTER — Ambulatory Visit: Admitting: Radiation Oncology

## 2024-05-31 ENCOUNTER — Ambulatory Visit: Admission: RE | Admit: 2024-05-31 | Source: Ambulatory Visit | Admitting: Radiation Oncology

## 2024-05-31 ENCOUNTER — Encounter: Payer: Self-pay | Admitting: Internal Medicine

## 2024-05-31 ENCOUNTER — Inpatient Hospital Stay

## 2024-05-31 ENCOUNTER — Inpatient Hospital Stay: Admitting: Internal Medicine

## 2024-05-31 ENCOUNTER — Inpatient Hospital Stay (HOSPITAL_BASED_OUTPATIENT_CLINIC_OR_DEPARTMENT_OTHER): Admitting: Hospice and Palliative Medicine

## 2024-05-31 VITALS — BP 101/51 | HR 80 | Temp 97.5°F | Resp 24 | Ht 71.0 in | Wt 284.0 lb

## 2024-05-31 DIAGNOSIS — C61 Malignant neoplasm of prostate: Secondary | ICD-10-CM

## 2024-05-31 DIAGNOSIS — Z515 Encounter for palliative care: Secondary | ICD-10-CM | POA: Diagnosis not present

## 2024-05-31 LAB — CBC WITH DIFFERENTIAL (CANCER CENTER ONLY)
Abs Immature Granulocytes: 0.01 K/uL (ref 0.00–0.07)
Basophils Absolute: 0 K/uL (ref 0.0–0.1)
Basophils Relative: 1 %
Eosinophils Absolute: 0.1 K/uL (ref 0.0–0.5)
Eosinophils Relative: 4 %
HCT: 26.8 % — ABNORMAL LOW (ref 39.0–52.0)
Hemoglobin: 8.6 g/dL — ABNORMAL LOW (ref 13.0–17.0)
Immature Granulocytes: 0 %
Lymphocytes Relative: 34 %
Lymphs Abs: 1 K/uL (ref 0.7–4.0)
MCH: 28.6 pg (ref 26.0–34.0)
MCHC: 32.1 g/dL (ref 30.0–36.0)
MCV: 89 fL (ref 80.0–100.0)
Monocytes Absolute: 0.3 K/uL (ref 0.1–1.0)
Monocytes Relative: 10 %
Neutro Abs: 1.5 K/uL — ABNORMAL LOW (ref 1.7–7.7)
Neutrophils Relative %: 51 %
Platelet Count: 280 K/uL (ref 150–400)
RBC: 3.01 MIL/uL — ABNORMAL LOW (ref 4.22–5.81)
RDW: 15.9 % — ABNORMAL HIGH (ref 11.5–15.5)
WBC Count: 3 K/uL — ABNORMAL LOW (ref 4.0–10.5)
nRBC: 0 % (ref 0.0–0.2)

## 2024-05-31 LAB — CMP (CANCER CENTER ONLY)
ALT: 9 U/L (ref 0–44)
AST: 18 U/L (ref 15–41)
Albumin: 3.1 g/dL — ABNORMAL LOW (ref 3.5–5.0)
Alkaline Phosphatase: 139 U/L — ABNORMAL HIGH (ref 38–126)
Anion gap: 8 (ref 5–15)
BUN: 16 mg/dL (ref 8–23)
CO2: 23 mmol/L (ref 22–32)
Calcium: 8.7 mg/dL — ABNORMAL LOW (ref 8.9–10.3)
Chloride: 105 mmol/L (ref 98–111)
Creatinine: 0.72 mg/dL (ref 0.61–1.24)
GFR, Estimated: >60 mL/min (ref >60)
Glucose, Bld: 134 mg/dL — ABNORMAL HIGH (ref 70–99)
Potassium: 4.6 mmol/L (ref 3.5–5.1)
Sodium: 136 mmol/L (ref 135–145)
Total Bilirubin: 0.6 mg/dL (ref 0.0–1.2)
Total Protein: 7.4 g/dL (ref 6.5–8.1)

## 2024-05-31 LAB — PSA: Prostatic Specific Antigen: >1500 ng/mL — ABNORMAL HIGH (ref 0.00–4.00)

## 2024-05-31 MED ORDER — DEGARELIX ACETATE 80 MG ~~LOC~~ SOLR
80.0000 mg | Freq: Once | SUBCUTANEOUS | Status: AC
  Administered 2024-05-31: 80 mg via SUBCUTANEOUS
  Filled 2024-05-31: qty 4

## 2024-05-31 NOTE — Assessment & Plan Note (Addendum)
#   AUG 2025- [hospital]- Metastatic prostate cancer-castrate sensitive de novo; high volume-awaiting r Tempus. Bx  IR:  METASTIC PROSTATE ADENOCARCINOMA  # on Firmagon  q 4 weeks. awaiting  radiatio oncology re: prostate cancer- mets to bone  #  since Patient has high risk- high-volume disease-however poor candidate for Taxotere chemotherapy.  I discussed other options including androgen receptor blockers-in addition to ADT.  Patient is a borderline candidate for Zytiga given the need for steroids and also history of diabetes on insulin .  Discussed with pharmacy- re: ARPI.  Awaiting  PET scan for further evaluation.  # Anemia-likely secondary to anemia of chronic disease/underlying prostate cancer/ Chronic kidney disease stage IIIa- stable; LOW I sat- consider- venofer  at next week-   # Multiple bone metastases-ongoing pain high risk for pathologic fracture. Awaiting radiation oncology today-    # Chronic kidney disease stage IIIa- Stable   # Peripheral vascular disease: Needing wound care stable.    # Diabetes monitor closely on current therapy.  # ACP-recommend evaluation with Josh Borders today.   # DISPOSITION: # Give the pt schedule for PET scan tomorrow-  # Firmagon  today # follow up in 5 weeks- MD; labs- cbc/cmp;psa; firmagon ; possible venofer - Dr.B

## 2024-05-31 NOTE — Progress Notes (Signed)
 Pt has a blister on his lt heel that's being treated.

## 2024-05-31 NOTE — Progress Notes (Signed)
 [facility] CONSULT NOTE  Patient Care Team: Adina Buel HERO, MD as PCP - General (Family Medicine) [name], MD as Consulting Physician (Oncology) Lenn Aran, MD as Consulting Physician (Radiation Oncology)  CHIEF COMPLAINTS/PURPOSE OF CONSULTATION: Prostate cancer  Oncology History Overview Note  # AUG 2025- [hospital]- Metastatic prostate cancer-castrate sensitive de novo; high volume-awaiting r Tempus. Bx  IR:  METASTIC PROSTATE ADENOCARCINOMA  # on Firmagon  q 4 weeks. awaiting  radiatio oncology re: prostate cancer- mets to bone  #    Prostate cancer metastatic to multiple sites [facility])  04/20/2024 Initial Diagnosis   Prostate cancer metastatic to multiple sites [facility])   05/10/2024 Cancer Staging   Staging form: Prostate, AJCC 8th Edition - Clinical: Stage IVB (cTX, cNX, pM1b) - Signed by [name], MD on 05/10/2024     Latest Reference Range & Units 04/17/24 02:36  Prostatic Specific Antigen 0.00 - 4.00 ng/mL 1,123.11 (H)  (H): Data is abnormally high  HISTORY OF PRESENTING ILLNESS: Patient ambulating- independently.   Alone.   [name] 73 y.o.  male pleasant patient with  with metastatic castrate sensitive prostate cancer  high risk/high volume-on ADT chronic right foot ulcer, CKD IIIa, HTN, DM and anemia - is here for a follow up.   Pt has a blister on his lt heel that's being treated. Currently on Firmagon . No hot flashes.   Review of Systems  Constitutional:  Positive for malaise/fatigue. Negative for chills, diaphoresis, fever and weight loss.  HENT:  Negative for nosebleeds and sore throat.   Eyes:  Negative for double vision.  Respiratory:  Negative for cough, hemoptysis, sputum production, shortness of breath and wheezing.   Cardiovascular:  Negative for chest pain, palpitations, orthopnea and leg swelling.  Gastrointestinal:  Negative for abdominal pain, blood in stool, constipation, diarrhea, heartburn, melena,  nausea and vomiting.  Genitourinary:  Negative for dysuria, frequency and urgency.  Musculoskeletal:  Positive for back pain and joint pain.  Skin: Negative.  Negative for itching and rash.  Neurological:  Negative for dizziness, tingling, focal weakness, weakness and headaches.  Endo/Heme/Allergies:  Does not bruise/bleed easily.  Psychiatric/Behavioral:  Negative for depression. The patient is not nervous/anxious and does not have insomnia.     MEDICAL HISTORY:  Past Medical History:  Diagnosis Date   Arthritis    Bladder incontinence    Chronic kidney disease    Stage 3 Kidney disease   Diabetes mellitus without complication (HCC)    DVT of leg (deep venous thrombosis) (HCC)    H/O RIGHT LEG 2018   Dyspnea    occassional   Hyperlipidemia    Hypertension    Peripheral vascular disease    Sepsis (HCC)     SURGICAL HISTORY: Past Surgical History:  Procedure Laterality Date   ABDOMINAL AORTOGRAM W/LOWER EXTREMITY N/A 10/10/2016   Procedure: Abdominal Aortogram w/Lower Extremity;  Surgeon: Cordella KANDICE Shawl, MD;  Location: [facility] INVASIVE CV LAB;  Service: Cardiovascular;  Laterality: N/A;   CATARACT EXTRACTION W/PHACO Left 05/27/2018   Procedure: CATARACT EXTRACTION PHACO AND INTRAOCULAR LENS PLACEMENT (IOC);  Surgeon: Myrna Adine Anes, MD;  Location: [facility] ORS;  Service: Ophthalmology;  Laterality: Left;  US  03:06.2 AP% 16.1 CDE 30.16 FLUID PACK LOT @ [id number] H   CATARACT EXTRACTION W/PHACO Right 07/13/2023   Procedure: CATARACT EXTRACTION PHACO AND INTRAOCULAR LENS PLACEMENT (IOC) RIGHT DIABETIC 4.00 00:36.6;  Surgeon: Myrna Adine Anes, MD;  Location: [facility];  Service: Ophthalmology;  Laterality: Right;   CIRCUMCISION  1998   COLONOSCOPY WITH PROPOFOL  N/A 08/20/2023   Procedure: COLONOSCOPY WITH PROPOFOL ;  Surgeon: Unk Corinn Skiff, MD;  Location: [facility] END[facility]COPY;  Service: Gastroenterology;  Laterality: N/A;   EYE SURGERY Left    Retina Detachment   GRAFT  APPLICATION Right 05/29/2017   Procedure: GRAFT APPLICATION ( RIGHT FOOT );  Surgeon: Jama Cordella MATSU, MD;  Location: [facility] ORS;  Service: Vascular;  Laterality: Right;  graft taken from patients right thigh   IRRIGATION AND DEBRIDEMENT FOOT Right 10/03/2016   Procedure: IRRIGATION AND DEBRIDEMENT FOOT;  Surgeon: Eva Gay, DPM;  Location: [facility] ORS;  Service: Podiatry;  Laterality: Right;   LOWER EXTREMITY ANGIOGRAPHY Right 02/24/2023   Procedure: Lower Extremity Angiography;  Surgeon: Jama Cordella MATSU, MD;  Location: [facility] CV LAB;  Service: Cardiovascular;  Laterality: Right;   LOWER EXTREMITY INTERVENTION  10/10/2016   Procedure: Lower Extremity Intervention;  Surgeon: Cordella MATSU Jama, MD;  Location: [facility];  Service: Cardiovascular;;   PERIPHERAL VASCULAR BALLOON ANGIOPLASTY Left 10/10/2016   Procedure: Peripheral Vascular Balloon Angioplasty;  Surgeon: Cordella MATSU Jama, MD;  Location: [facility];  Service: Cardiovascular;  Laterality: Left;   TONSILLECTOMY     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/2022   Procedure: TRANSMETATARSAL AMPUTATION;  Surgeon: Gay Eva, DPM;  Location: [facility] ORS;  Service: Podiatry;  Laterality: Right;   WOUND DEBRIDEMENT Right 11/07/2016   Procedure: DEBRIDEMENT OF WOUND AND BONE RIGHT FOOT AND APPLY WOUND VAC;  Surgeon: Eva Gay, DPM;  Location: [facility] ORS;  Service: Podiatry;  Laterality: Right;    SOCIAL HISTORY: Social History   Socioeconomic History   Marital status: Single    Spouse name: Not on file   Number of children: Not on file   Years of education: Not on file   Highest education level: Not on file  Occupational History   Not on file  Tobacco Use   Smoking status: Former    Types: Pipe, Cigarettes    Quit date: 05/23/1975    Years since quitting: 49.0   Smokeless tobacco: Never  Vaping Use   Vaping status: Never Used  Substance and Sexual Activity   Alcohol use: No   Drug use: No   Sexual  activity: Not on file  Other Topics Concern   Not on file  Social History Narrative   Not on file   Social Drivers of Health   Financial Resource Strain: Low Risk  (08/28/2023)   Received from [facility]   Overall Financial Resource Strain (CARDIA)    Difficulty of Paying Living Expenses: Not hard at all  Recent Concern: Financial Resource Strain - Medium Risk (08/06/2023)   Received from [facility]   Overall Financial Resource Strain (CARDIA)    Difficulty of Paying Living Expenses: Somewhat hard  Food Insecurity: No Food Insecurity (05/03/2024)   Hunger Vital Sign    Worried About Running Out of Food in the Last Year: Never true    Ran Out of Food in the Last Year: Never true  Transportation Needs: No Transportation Needs (05/03/2024)   PRAPARE - Administrator, Civil Service (Medical): No    Lack of Transportation (Non-Medical): No  Physical Activity: Inactive (08/28/2023)   Received from [facility]   Exercise Vital Sign    On average, how many days per week do you engage in moderate to strenuous exercise (like a brisk walk)?: 0 days    On average, how many minutes  do you engage in exercise at this level?: 0 min  Stress: No Stress Concern Present (08/28/2023)   Received from [facility] - Occupational Stress Questionnaire    Feeling of Stress : Not at all  Social Connections: Moderately Integrated (04/17/2024)   Social Connection and Isolation Panel    Frequency of Communication with Friends and Family: Three times a week    Frequency of Social Gatherings with Friends and Family: Three times a week    Attends Religious Services: 1 to 4 times per year    Active Member of Clubs or Organizations: Yes    Attends Banker Meetings: 1 to 4 times per year    Marital Status: Never married  Intimate Partner Violence: Not At Risk (05/03/2024)    Humiliation, Afraid, Rape, and Kick questionnaire    Fear of Current or Ex-Partner: No    Emotionally Abused: No    Physically Abused: No    Sexually Abused: No    FAMILY HISTORY: Family History  Problem Relation Age of Onset   Diabetes Mother    Hypertension Mother    Diabetes Father    Hypertension Father     ALLERGIES:  is allergic to sulfa antibiotics and piperacillin -tazobactam in dex.  MEDICATIONS:  Current Outpatient Medications  Medication Sig Dispense Refill   ACCU-CHEK GUIDE test strip Use 1 strip via meter four times a day  to monitor blood glucose     Accu-Chek Softclix Lancets lancets      acetaminophen  (TYLENOL ) 325 MG tablet Take 650 mg by mouth every 6 (six) hours as needed.     aspirin  EC 81 MG tablet Take 81 mg by mouth daily. Swallow whole.     atorvastatin  (LIPITOR) 10 MG tablet Take 10 mg by mouth every evening.     cefTRIAXone  (ROCEPHIN ) IVPB 2 g daily.     cetirizine (ZYRTEC) 10 MG tablet Take 10 mg by mouth daily.     cyanocobalamin  1000 MCG tablet Take 1 tablet (1,000 mcg total) by mouth daily.     DROPLET INSULIN  SYRINGE 31G X 5/16 0.3 ML MISC USE FIVE TIMES DAILY AS DIRECTED     FEROSUL 325 (65 Fe) MG tablet Take 325 mg by mouth 2 (two) times daily with a meal.     glucose blood (ACCU-CHEK AVIVA PLUS) test strip      insulin  glargine (LANTUS ) 100 UNIT/ML injection Inject 0.15 mLs (15 Units total) into the skin at bedtime. 10 mL 11   insulin  lispro (HUMALOG) 100 UNIT/ML injection Inject 4 Units into the skin See admin instructions. Inject 4 units SQ with breakfast, inject 4 units SQ with lunch and inject 4 units SQ with dinner     Insulin  Syringe-Needle U-100 31G X 5/16 0.3 ML MISC      LORazepam (ATIVAN) 1 MG tablet Take 1 mg by mouth every 8 (eight) hours.     metFORMIN (GLUCOPHAGE) 1000 MG tablet Take 1,000 mg by mouth 2 (two) times daily with a meal.     molnupiravir EUA (LAGEVRIO) 200 MG CAPS capsule Take 4 capsules by mouth 2 (two) times daily.      ondansetron  (ZOFRAN ) 4 MG tablet Take 4 mg by mouth every 8 (eight) hours as needed for nausea or vomiting.     oxyCODONE  (OXY IR/ROXICODONE ) 5 MG immediate release tablet Take 1 tablet (5 mg total) by mouth every 4 (four) hours as needed for moderate pain (pain score 4-6).  SNF use only.  Refills per SNF MD 10 tablet 0   polyethylene glycol (MIRALAX  / GLYCOLAX ) packet Take 17 g by mouth daily.     senna (SENOKOT) 8.6 MG TABS tablet Take 1 tablet by mouth 2 (two) times daily.     Simethicone  (GAS-X ULTRA STRENGTH PO) Take 1 capsule by mouth 4 (four) times daily as needed (for flatulence).     Wound Cleansers (VASHE WOUND THERAPY EX) Apply 1 Application topically daily after breakfast.     No current facility-administered medications for this visit.    PHYSICAL EXAMINATION:   Vitals:   05/31/24 0821  BP: (!) 101/51  Pulse: 80  Resp: (!) 24  Temp: (!) 97.5 F (36.4 C)  SpO2: 100%   Filed Weights   05/31/24 0821  Weight: 284 lb (128.8 kg)    Physical Exam Vitals and nursing note reviewed.  HENT:     Head: Normocephalic and atraumatic.     Mouth/Throat:     Pharynx: Oropharynx is clear.  Eyes:     Extraocular Movements: Extraocular movements intact.     Pupils: Pupils are equal, round, and reactive to light.  Cardiovascular:     Rate and Rhythm: Normal rate and regular rhythm.  Pulmonary:     Comments: Decreased breath sounds bilaterally.  Abdominal:     Palpations: Abdomen is soft.  Musculoskeletal:        General: Normal range of motion.     Cervical back: Normal range of motion.  Skin:    General: Skin is warm.  Neurological:     General: No focal deficit present.     Mental Status: He is alert and oriented to person, place, and time.  Psychiatric:        Behavior: Behavior normal.        Judgment: Judgment normal.     LABORATORY DATA:  I have reviewed the data as listed Lab Results  Component Value Date   WBC 3.0 (L) 05/31/2024   HGB 8.6 (L) 05/31/2024    HCT 26.8 (L) 05/31/2024   MCV 89.0 05/31/2024   PLT 280 05/31/2024   Recent Labs    04/17/24 0236 04/19/24 0619 05/03/24 1331 05/31/24 0817  NA 139 136 130* 136  K 3.8 4.4 5.2* 4.6  CL 106 101 97* 105  CO2 25 25 25 23   GLUCOSE 185* 238* 145* 134*  BUN 21 22 24* 16  CREATININE 0.82 1.00 0.99 0.72  CALCIUM  8.9 8.9 8.9 8.7*  GFRNONAA >60 >60 >60 >60  PROT 7.3  --  8.3* 7.4  ALBUMIN 3.2*  --  3.5 3.1*  AST 19  --  18 18  ALT 10  --  12 9  ALKPHOS 72  --  155* 139*  BILITOT 0.9  --  0.8 0.6    RADIOGRAPHIC STUDIES: I have personally reviewed the radiological images as listed and agreed with the findings in the report. No results found.    Prostate cancer metastatic to multiple sites [facility]) # AUG 2025- [hospital]- Metastatic prostate cancer-castrate sensitive de novo; high volume-awaiting r Tempus. Bx  IR:  METASTIC PROSTATE ADENOCARCINOMA  # on Firmagon  q 4 weeks. awaiting  radiatio oncology re: prostate cancer- mets to bone  #  since Patient has high risk- high-volume disease-however poor candidate for Taxotere chemotherapy.  I discussed other options including androgen receptor blockers-in addition to ADT.  Patient is a borderline candidate for Zytiga given the need for steroids and also history of diabetes on  insulin .  Discussed with pharmacy- re: ARPI.  Awaiting  PET scan for further evaluation.  # Anemia-likely secondary to anemia of chronic disease/underlying prostate cancer/ Chronic kidney disease stage IIIa- stable; LOW I sat- consider- venofer  at next week-   # Multiple bone metastases-ongoing pain high risk for pathologic fracture. Awaiting radiation oncology today-    # Chronic kidney disease stage IIIa- Stable   # Peripheral vascular disease: Needing wound care stable.    # Diabetes monitor closely on current therapy.  # ACP-recommend evaluation with Josh Borders today.   # DISPOSITION: # Give the pt schedule for PET scan tomorrow-  # Firmagon  today #  follow up in 5 weeks- MD; labs- cbc/cmp;psa; firmagon ; possible venofer - Dr.B   Above plan of care was discussed with patient/family in detail.  My contact information was given to the patient/family.     Cindy JONELLE Joe, MD 05/31/2024 10:10 AM

## 2024-05-31 NOTE — Progress Notes (Signed)
 Palliative Medicine [facility] at [facility] Telephone:([phone]) [phone] Fax:(336) [phone]   Name: [name] Date: 05/31/2024 MRN: [id number]  DOB: Dec 28, 1950  Patient Care Team: Adina Buel HERO, MD as PCP - General (Family Medicine) [name], MD as Consulting Physician (Oncology) Lenn Aran, MD as Consulting Physician (Radiation Oncology)    REASON FOR CONSULTATION: [name] is a 73 y.o. 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  Patient was referred to palliative care to address goals and manage ongoing symptoms.  SOCIAL HISTORY:     reports that he quit smoking about 49 years ago. His smoking use included pipe and cigarettes. He has never used smokeless tobacco. He reports that he does not drink alcohol and does not use drugs.  Patient was never married and has no children.  He lives with his sister.  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  He completed 3 years of education at [employer] where he studied religion.  Patient dropped out of college to care for his family when his father became ill.  ADVANCE DIRECTIVES:  Not on file  CODE STATUS: Full code  PAST MEDICAL HISTORY: Past Medical History:  Diagnosis Date   Arthritis    Bladder incontinence    Chronic kidney disease    Stage 3 Kidney disease   Diabetes mellitus without complication (HCC)    DVT of leg (deep venous thrombosis) (HCC)    H/O RIGHT LEG 2018   Dyspnea    occassional   Hyperlipidemia    Hypertension    Peripheral vascular disease    Sepsis (HCC)     PAST SURGICAL HISTORY:  Past Surgical History:  Procedure Laterality Date   ABDOMINAL AORTOGRAM W/LOWER EXTREMITY N/A 10/10/2016   Procedure: Abdominal Aortogram w/Lower Extremity;  Surgeon: Cordella KANDICE Shawl, MD;  Location: [facility] INVASIVE CV LAB;  Service:  Cardiovascular;  Laterality: N/A;   CATARACT EXTRACTION W/PHACO Left 05/27/2018   Procedure: CATARACT EXTRACTION PHACO AND INTRAOCULAR LENS PLACEMENT (IOC);  Surgeon: Myrna Adine Anes, MD;  Location: [facility] ORS;  Service: Ophthalmology;  Laterality: Left;  US  03:06.2 AP% 16.1 CDE 30.16 FLUID PACK LOT @ [id number] H   CATARACT EXTRACTION W/PHACO Right 07/13/2023   Procedure: CATARACT EXTRACTION PHACO AND INTRAOCULAR LENS PLACEMENT (IOC) RIGHT DIABETIC 4.00 00:36.6;  Surgeon: Myrna Adine Anes, MD;  Location: [facility];  Service: Ophthalmology;  Laterality: Right;   CIRCUMCISION  1998   COLONOSCOPY WITH PROPOFOL  N/A 08/20/2023   Procedure: COLONOSCOPY WITH PROPOFOL ;  Surgeon: Unk Corinn Skiff, MD;  Location: [facility] ENDOSCOPY;  Service: Gastroenterology;  Laterality: N/A;   EYE SURGERY Left    Retina Detachment   GRAFT APPLICATION Right 05/29/2017   Procedure: GRAFT APPLICATION ( RIGHT FOOT );  Surgeon: Shawl Cordella KANDICE, MD;  Location: [facility] ORS;  Service: Vascular;  Laterality: Right;  graft taken from patients right thigh   IRRIGATION AND DEBRIDEMENT FOOT Right 10/03/2016   Procedure: IRRIGATION AND DEBRIDEMENT FOOT;  Surgeon: Eva Gay, DPM;  Location: [facility] ORS;  Service: Podiatry;  Laterality: Right;   LOWER EXTREMITY ANGIOGRAPHY Right 02/24/2023   Procedure: Lower Extremity Angiography;  Surgeon: Shawl Cordella KANDICE, MD;  Location: [facility] INVASIVE CV LAB;  Service: Cardiovascular;  Laterality: Right;   LOWER EXTREMITY INTERVENTION  10/10/2016   Procedure: Lower Extremity Intervention;  Surgeon: Cordella KANDICE Shawl, MD;  Location: [facility] INVASIVE  CV LAB;  Service: Cardiovascular;;   PERIPHERAL VASCULAR BALLOON ANGIOPLASTY Left 10/10/2016   Procedure: Peripheral Vascular Balloon Angioplasty;  Surgeon: Cordella KANDICE Shawl, MD;  Location: [facility] INVASIVE CV LAB;  Service: Cardiovascular;  Laterality: Left;   TONSILLECTOMY     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/2022   Procedure: TRANSMETATARSAL  AMPUTATION;  Surgeon: Ashley Soulier, DPM;  Location: [facility] ORS;  Service: Podiatry;  Laterality: Right;   WOUND DEBRIDEMENT Right 11/07/2016   Procedure: DEBRIDEMENT OF WOUND AND BONE RIGHT FOOT AND APPLY WOUND VAC;  Surgeon: Soulier Ashley, DPM;  Location: [facility] ORS;  Service: Podiatry;  Laterality: Right;    HEMATOLOGY/ONCOLOGY HISTORY:  Oncology History  Prostate cancer metastatic to multiple sites [facility])  04/20/2024 Initial Diagnosis   Prostate cancer metastatic to multiple sites [facility])   05/10/2024 Cancer Staging   Staging form: Prostate, AJCC 8th Edition - Clinical: Stage IVB (cTX, cNX, pM1b) - Signed by [name], MD on 05/10/2024     ALLERGIES:  is allergic to sulfa antibiotics and piperacillin -tazobactam in dex.  MEDICATIONS:  Current Outpatient Medications  Medication Sig Dispense Refill   ACCU-CHEK GUIDE test strip Use 1 strip via meter four times a day  to monitor blood glucose     Accu-Chek Softclix Lancets lancets      acetaminophen  (TYLENOL ) 325 MG tablet Take 650 mg by mouth every 6 (six) hours as needed.     aspirin  EC 81 MG tablet Take 81 mg by mouth daily. Swallow whole.     atorvastatin  (LIPITOR) 10 MG tablet Take 10 mg by mouth every evening.     cefTRIAXone  (ROCEPHIN ) IVPB 2 g daily.     cetirizine (ZYRTEC) 10 MG tablet Take 10 mg by mouth daily.     cyanocobalamin  1000 MCG tablet Take 1 tablet (1,000 mcg total) by mouth daily.     DROPLET INSULIN  SYRINGE 31G X 5/16 0.3 ML MISC USE FIVE TIMES DAILY AS DIRECTED     FEROSUL 325 (65 Fe) MG tablet Take 325 mg by mouth 2 (two) times daily with a meal.     glucose blood (ACCU-CHEK AVIVA PLUS) test strip      insulin  glargine (LANTUS ) 100 UNIT/ML injection Inject 0.15 mLs (15 Units total) into the skin at bedtime. 10 mL 11   insulin  lispro (HUMALOG) 100 UNIT/ML injection Inject 4 Units into the skin See admin instructions. Inject 4 units SQ with breakfast, inject 4 units SQ with lunch and inject 4 units SQ with  dinner     Insulin  Syringe-Needle U-100 31G X 5/16 0.3 ML MISC      LORazepam (ATIVAN) 1 MG tablet Take 1 mg by mouth every 8 (eight) hours.     metFORMIN (GLUCOPHAGE) 1000 MG tablet Take 1,000 mg by mouth 2 (two) times daily with a meal.     molnupiravir EUA (LAGEVRIO) 200 MG CAPS capsule Take 4 capsules by mouth 2 (two) times daily.     ondansetron  (ZOFRAN ) 4 MG tablet Take 4 mg by mouth every 8 (eight) hours as needed for nausea or vomiting.     oxyCODONE  (OXY IR/ROXICODONE ) 5 MG immediate release tablet Take 1 tablet (5 mg total) by mouth every 4 (four) hours as needed for moderate pain (pain score 4-6). SNF use only.  Refills per SNF MD 10 tablet 0   polyethylene glycol (MIRALAX  / GLYCOLAX ) packet Take 17 g by mouth daily.     senna (SENOKOT) 8.6 MG TABS tablet Take 1 tablet by mouth 2 (two) times daily.  Simethicone  (GAS-X ULTRA STRENGTH PO) Take 1 capsule by mouth 4 (four) times daily as needed (for flatulence).     Wound Cleansers (VASHE WOUND THERAPY EX) Apply 1 Application topically daily after breakfast.     No current facility-administered medications for this visit.    VITAL SIGNS: There were no vitals taken for this visit. There were no vitals filed for this visit.  Estimated body mass index is 39.61 kg/m as calculated from the following:   Height as of an earlier encounter on 05/31/24: 5' 11 (1.803 m).   Weight as of an earlier encounter on 05/31/24: 284 lb (128.8 kg).  LABS: CBC:    Component Value Date/Time   WBC 3.0 (L) 05/31/2024 0817   WBC 4.7 04/20/2024 0532   HGB 8.6 (L) 05/31/2024 0817   HCT 26.8 (L) 05/31/2024 0817   PLT 280 05/31/2024 0817   MCV 89.0 05/31/2024 0817   NEUTROABS 1.5 (L) 05/31/2024 0817   LYMPHSABS 1.0 05/31/2024 0817   MONOABS 0.3 05/31/2024 0817   EOSABS 0.1 05/31/2024 0817   BASOSABS 0.0 05/31/2024 0817   Comprehensive Metabolic Panel:    Component Value Date/Time   NA 136 05/31/2024 0817   NA 140 04/04/2013 1238   K 4.6  05/31/2024 0817   K 4.1 04/04/2013 1238   CL 105 05/31/2024 0817   CL 108 (H) 04/04/2013 1238   CO2 23 05/31/2024 0817   CO2 30 04/04/2013 1238   BUN 16 05/31/2024 0817   BUN 19 (H) 04/04/2013 1238   CREATININE 0.72 05/31/2024 0817   CREATININE 0.87 04/04/2013 1238   GLUCOSE 134 (H) 05/31/2024 0817   GLUCOSE 98 04/04/2013 1238   CALCIUM  8.7 (L) 05/31/2024 0817   CALCIUM  8.8 04/04/2013 1238   AST 18 05/31/2024 0817   ALT 9 05/31/2024 0817   ALKPHOS 139 (H) 05/31/2024 0817   BILITOT 0.6 05/31/2024 0817   PROT 7.4 05/31/2024 0817   ALBUMIN 3.1 (L) 05/31/2024 0817    RADIOGRAPHIC STUDIES: No results found.  PERFORMANCE STATUS (ECOG) : 2 - Symptomatic, <50% confined to bed  Review of Systems Unless otherwise noted, a complete review of systems is negative.  Physical Exam General: NAD Cardiovascular: regular rate and rhythm Pulmonary: clear ant fields Abdomen: soft, nontender, + bowel sounds GU: no suprapubic tenderness Extremities: no edema, no joint deformities Skin: no rashes Neurological: Weakness but otherwise nonfocal  IMPRESSION: Patient seen after his visit with Dr. Rennie.  Patient recently diagnosed with stage IV prostate cancer.  He has been started on ADT.  He is not felt to be a candidate for chemotherapy due to his other comorbidities and poor performance status.  Patient is being referred for XRT.  Patient currently at rehab but says that he is going home tomorrow to live with his sister.  Prior to his recent hospitalization and fall, patient says that he was functionally independent with his own care.  He is now ambulating with use of a walker but patient feels that he is getting stronger each day.  Symptomatically, patient says he is doing well.  He denies severe pain at present.  He denies other distressing symptoms.  Overall, patient says he feels good.  Patient says he recognizes that he has stage IV cancer and that it is ultimately incurable.   However, patient says he is optimistic.  He says he has strong faith in God and believes in healing.  He cites his mother being told that his younger sister would not survive childhood.  His sister is now 92.  Patient says he has previously completed advance directives.  He has appointed his younger sister, Inocente, to be his HCPOA.  I requested that he bring in a copy of these documents to be scanned into the chart.  Patient says that he does wish to remain a full code for now.  However, he says that he would be willing to reconsider in the event that he is declining or if care appeared to be futile.  Right now, he feels he has good quality of life.  PLAN: - Continue current scope of treatment - Patient to bring in ACP documents - Referral to community palliative care - RTC 1 month   Patient expressed understanding and was in agreement with this plan. He also understands that He can call the clinic at any time with any questions, concerns, or complaints.     Time Total: 25 minutes  Visit consisted of counseling and education dealing with the complex and emotionally intense issues of symptom management and palliative care in the setting of serious and potentially life-threatening illness.Greater than 50%  of this time was spent counseling and coordinating care related to the above assessment and plan.  Signed by: Fonda Mower, PhD, NP-C

## 2024-06-01 ENCOUNTER — Other Ambulatory Visit (HOSPITAL_COMMUNITY): Payer: Self-pay

## 2024-06-01 ENCOUNTER — Telehealth: Payer: Self-pay | Admitting: Pharmacist

## 2024-06-01 ENCOUNTER — Ambulatory Visit
Admission: RE | Admit: 2024-06-01 | Discharge: 2024-06-01 | Disposition: A | Discharge status: Home / Self Care | Source: Ambulatory Visit | Attending: Internal Medicine | Admitting: Internal Medicine

## 2024-06-01 ENCOUNTER — Telehealth: Payer: Self-pay | Admitting: Pharmacy Technician

## 2024-06-01 ENCOUNTER — Encounter: Payer: Self-pay | Admitting: Internal Medicine

## 2024-06-01 DIAGNOSIS — C7951 Secondary malignant neoplasm of bone: Secondary | ICD-10-CM | POA: Diagnosis not present

## 2024-06-01 DIAGNOSIS — C61 Malignant neoplasm of prostate: Secondary | ICD-10-CM

## 2024-06-01 DIAGNOSIS — N200 Calculus of kidney: Secondary | ICD-10-CM | POA: Diagnosis not present

## 2024-06-01 DIAGNOSIS — K8020 Calculus of gallbladder without cholecystitis without obstruction: Secondary | ICD-10-CM | POA: Insufficient documentation

## 2024-06-01 MED ORDER — FLOTUFOLASTAT F 18 GALLIUM 296-5846 MBQ/ML IV SOLN
8.0000 | Freq: Once | INTRAVENOUS | Status: AC
Start: 2024-06-01 — End: 2024-06-01
  Administered 2024-06-01: 8.15 via INTRAVENOUS
  Filled 2024-06-01: qty 8

## 2024-06-01 NOTE — Telephone Encounter (Signed)
 Oral Oncology Patient Advocate Encounter   New authorization   Received notification that prior authorization for Erleada is required.   PA submitted on CMM via Latent [employer] [id number] Status is pending     Miette Molenda ([name]) [name], CPhT  [facility], [name], [facility] - Oral Chemotherapy Patient Advocate Specialist III Phone: [phone]  Fax: [phone]

## 2024-06-01 NOTE — Telephone Encounter (Signed)
 Clinical Pharmacist Practitioner Encounter   Received new prescription for Erleada (apalutamide) for the treatment of metastatic prostate cancer-castrate sensitive in conjunction with ADT, planned duration until disease progression or unacceptable drug toxicity.  Prescription dose and frequency assessed.   Current medication list in Epic reviewed, a few DDIs with apalutamide identified: Apalutamide may decrease the serum concentration of atorvastatin , ondansetron , and oxycodone . Monitor patient for decreased effectiveness of the listed medications. No baseline dose adjustment needed.   Evaluated chart and no patient barriers to medication adherence identified.   Oral Oncology Clinic will continue to follow for insurance authorization, copayment issues, initial counseling and start date.   Sanora Cunanan N. Marjorie Lussier, PharmD, BCOP, CPP Hematology/Oncology Clinical Pharmacist [facility] Oral Chemotherapy Navigation Clinic [phone]  06/01/2024 3:03 PM

## 2024-06-01 NOTE — Telephone Encounter (Signed)
 Oral Oncology Patient Advocate Encounter   Began application for assistance for Erleada through [name].   Application will be submitted upon completion of necessary supporting documentation.   [name] Jerry Patient Assistance Program phone number [phone].   Application has been faxed to [facility], their fax number is [phone], to have the patient complete his portion of the application.  The provider portion of the application has been set on Dr. Damaris desk.   Japleen Tornow ([name]) [name], CPhT  [facility], [name], [facility] - Oral Chemotherapy Patient Advocate Specialist III Phone: [phone]  Fax: [phone]

## 2024-06-01 NOTE — Telephone Encounter (Signed)
 Oral Oncology Patient Advocate Encounter  Prior Authorization for [name] has been approved.    PA# [id number] Effective dates: 09/02/2023 through 08/31/2025  Patients co-pay is $1,302.65.    [name] ([name]) [name], CPhT  [facility], [name], [facility] - Oral Chemotherapy Patient Advocate Specialist III Phone: [phone]  Fax: [phone]

## 2024-06-02 NOTE — Telephone Encounter (Signed)
 Oral Oncology Patient Advocate Encounter  Per Alyson, patient has not received the application. Alyson stated that the patient's sister, Inocente, said the patient should be going home tomorrow, 10/03.   Alyson has given Inocente my phone number so she can either have the patient call me or she will call me to give me his phone number.  Alyson stated the patient and his sister are aware of the application process.  I will have the patient sign the application once I am able to touch base with him.   Mollyann Halbert ([name]) [name], CPhT  [facility], [name], [facility] - Oral Chemotherapy Patient Advocate Specialist III Phone: [phone]  Fax: [phone]

## 2024-06-03 NOTE — Telephone Encounter (Signed)
 Patient's sister [name] called back, she said [name] told her he actually had one more day of therapy and would be going home tomorrow 06/04/24. She was able to provider his cell phone number of [phone] (cell). She said he would have access to this when he was back home

## 2024-06-07 NOTE — Telephone Encounter (Signed)
 Oral Oncology Patient Advocate Encounter  Per Maurice, RN, patient has changed his appointment to Wednesday 10/15.  He will sign the application at that time.  Jackline Castilla ([name]) [name], CPhT  [facility], [name], [facility] - Oral Chemotherapy Patient Advocate Specialist III Phone: [phone]  Fax: [phone]

## 2024-06-07 NOTE — Telephone Encounter (Signed)
 Oral Oncology Patient Advocate Encounter  I have been having a hard time getting in contact with the patient now that he is discharged from [facility].  The patient has an appointment with Dr. Lenn tomorrow, 10/8, and her team has agreed to get Mr. Mckinnon signature.  Dr.B's signature has been obtained and provider portion is complete.  Reily Ilic ([name]) [name], CPhT  [facility], [name], [facility] - Oral Chemotherapy Patient Advocate Specialist III Phone: [phone]  Fax: [phone]

## 2024-06-07 NOTE — Telephone Encounter (Signed)
 Oral Oncology Patient Advocate Encounter  Mr. Speir called me back today and said he would be canceling his appointment tomorrow, he stated I am not able to walk down the steps so I cannot leave the house.  I will be mailing the application to the patient since he does not have an email address for me to send it to him virtually.  Thus far there has been a lot of barriers and obstacles for the patient to receive his medication/care.  Rajvi Armentor ([name]) [name], CPhT  [facility], [name], [facility] - Oral Chemotherapy Patient Advocate Specialist III Phone: [phone]  Fax: [phone]

## 2024-06-08 ENCOUNTER — Ambulatory Visit: Admitting: Radiation Oncology

## 2024-06-09 ENCOUNTER — Encounter: Payer: Self-pay | Admitting: Internal Medicine

## 2024-06-09 ENCOUNTER — Telehealth: Payer: Self-pay | Admitting: Pharmacy Technician

## 2024-06-09 NOTE — Telephone Encounter (Addendum)
 Oral Oncology Patient Advocate Encounter  [employer] funding opened while waiting for PAP requirements. Please see new encounter by me for further details.  Andreka Stucki ([name]) [name], CPhT  [facility], [name], [facility] - Oral Chemotherapy Patient Advocate Specialist III Phone: [phone]  Fax: [phone]

## 2024-06-09 NOTE — Telephone Encounter (Signed)
 Oral Oncology Patient Advocate Encounter   Was successful in securing patient a $3500 grant from [employer] (PAF) to provide copayment coverage for Erleada.  This will keep the out of pocket expense at $0.     The billing information is as follows and has been shared with [facility].   RxBin: [id number] PCN:  [id number] Member ID: [id number] Group ID: [id number] Dates of Eligibility: 12/12/2023 through 06/09/2025    Barbette ([name]) [name], CPhT  [facility], [name], [facility] - Oral Chemotherapy Patient Advocate Specialist III Phone: [phone]  Fax: [phone]

## 2024-06-10 ENCOUNTER — Telehealth: Payer: Self-pay | Admitting: Pharmacy Technician

## 2024-06-10 ENCOUNTER — Other Ambulatory Visit: Payer: Self-pay | Admitting: Pharmacy Technician

## 2024-06-10 ENCOUNTER — Other Ambulatory Visit (HOSPITAL_COMMUNITY): Payer: Self-pay

## 2024-06-10 ENCOUNTER — Other Ambulatory Visit: Payer: Self-pay

## 2024-06-10 ENCOUNTER — Encounter: Payer: Self-pay | Admitting: Internal Medicine

## 2024-06-10 MED ORDER — APALUTAMIDE 60 MG PO TABS
240.0000 mg | ORAL_TABLET | Freq: Every day | ORAL | 0 refills | Status: DC
  Filled 2024-06-10: qty 120, 30d supply, fill #0

## 2024-06-10 NOTE — Progress Notes (Signed)
 Oral Chemotherapy Pharmacist Encounter  Patient was counseled under telephone encounter from 06/01/24.  Asberry Macintosh, PharmD, BCPS, BCOP Hematology/Oncology Clinical Pharmacist [facility] and [facility] Oral Chemotherapy Navigation Clinics [phone] 06/10/2024 9:48 AM

## 2024-06-10 NOTE — Progress Notes (Signed)
 Specialty Pharmacy Initial Fill Coordination Note  [name] is a 72 y.o. male contacted today regarding refills of specialty medication(s) Apalutamide (ERLEADA) .  Patient requested Delivery  on 06/14/24  to verified address [address] [place] [address]   Medication will be filled on 10/13.   Patient is aware of $0 copayment. Leisure centre manager.  [name] ([name]) [name], CPhT  [facility], [name], [facility] - Oral Chemotherapy Patient Advocate Specialist III Phone: [phone]  Fax: [phone]

## 2024-06-10 NOTE — Telephone Encounter (Signed)
 Oral Oncology Patient Advocate Encounter  Patient successfully OnBoarded and drug education provided by pharmacist. Medication scheduled to be shipped on 10/13 for delivery on 10/14 from [facility] to patient's address. Patient also knows to call me at [phone] with any questions or concerns regarding receiving medication or if there is any unexpected change in co-pay.    [name] ([name]) [name], CPhT  [facility], [name], [facility] - Oral Chemotherapy Patient Advocate Specialist III Phone: [phone]  Fax: [phone]

## 2024-06-10 NOTE — Telephone Encounter (Signed)
 Oral Chemotherapy Pharmacist Encounter  I spoke with patient for overview of: Erleada (apalutamide) for the treatment of metastatic, castration-sensitive prostate cancer, in conjunction with ADT, planned duration until disease progression or unacceptable toxicity.   Treatment goal: Palliative  Counseled patient on administration, dosing, side effects, monitoring, drug-food interactions, safe handling, storage, and disposal.  Patient will take Erleada 60mg  tablets, 4 tablets (240mg ) by mouth once daily without regard to food.  Erleada start date: 06/15/24  Adverse effects include but are not limited to: rash, peripheral edema, GI upset, hypertension, hot flashes, fatigue, and arthralgias.    Reviewed with patient importance of keeping a medication schedule and plan for any missed doses. No barriers to medication adherence identified.  Medication reconciliation performed and medication/allergy list updated.  Distress thermometer flowsheet: Distress thermometer completed during telephone call and reviewed with patient. Due to score, social work referral has not been sent.  Communication and Learning Assessment Primary learner: Patient Barriers to learning: No barriers Preferred language: English Learning preferences: Listening Reading  All questions answered.  [name] voiced understanding and appreciation.   Medication education handout placed in mail for patient. Patient knows to call the office with questions or concerns. Oral Chemotherapy Clinic phone number provided to patient.   [name], PharmD, BCPS, BCOP Hematology/Oncology Clinical Pharmacist [phone] 06/10/2024 9:44 AM

## 2024-06-15 ENCOUNTER — Ambulatory Visit
Admission: RE | Admit: 2024-06-15 | Discharge: 2024-06-15 | Disposition: A | Discharge status: Home / Self Care | Source: Ambulatory Visit | Attending: Radiation Oncology | Admitting: Radiation Oncology

## 2024-06-15 ENCOUNTER — Encounter: Payer: Self-pay | Admitting: Radiation Oncology

## 2024-06-15 VITALS — BP 107/63 | HR 102 | Temp 97.3°F | Resp 16 | Wt 271.0 lb

## 2024-06-15 DIAGNOSIS — C61 Malignant neoplasm of prostate: Secondary | ICD-10-CM | POA: Insufficient documentation

## 2024-06-15 DIAGNOSIS — Z510 Encounter for antineoplastic radiation therapy: Secondary | ICD-10-CM | POA: Insufficient documentation

## 2024-06-15 DIAGNOSIS — C7951 Secondary malignant neoplasm of bone: Secondary | ICD-10-CM | POA: Insufficient documentation

## 2024-06-15 NOTE — Consult Note (Signed)
 NEW PATIENT EVALUATION  Name: [name]  MRN: [id number]  Date:   06/15/2024     DOB: 1950-09-07   This 73 y.o. male patient presents to the clinic for initial evaluation of widespread bone metastasis and patient with known stage IV castrate sensitive prostate cancer.  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   PREVIOUS INVESTIGATIONS:  PSMA PET scan reviewed Clinical notes reviewed Pathology reviewed  HPI: Patient is a 73 year old male with stage IV castrate sensitive prostate cancer.  He has been complaining of significant low back pain although recently that is improved somewhat.  He has multiple medical comorbidities including chronic right foot ulcer CKD 3 AA hypertension DM and anemia.  His PSMA PET scan was performed showing widespread bony metastasis especially in the spinal region with significant disease in the lumbar spine and SI joint region.  He is seen today for consideration of palliative radiation therapy to those areas.  PLANNED TREATMENT REGIMEN: Palliative radiation therapy to L-spine SI joints  PAST MEDICAL HISTORY:  has a past medical history of Arthritis, Bladder incontinence, Chronic kidney disease, Diabetes mellitus without complication (HCC), DVT of leg (deep venous thrombosis) (HCC), Dyspnea, Hyperlipidemia, Hypertension, Peripheral vascular disease, and Sepsis (HCC).    PAST SURGICAL HISTORY:  Past Surgical History:  Procedure Laterality Date   ABDOMINAL AORTOGRAM W/LOWER EXTREMITY N/A 10/10/2016   Procedure: Abdominal Aortogram w/Lower Extremity;  Surgeon: Cordella KANDICE Shawl, MD;  Location: [facility] INVASIVE CV LAB;  Service: Cardiovascular;  Laterality: N/A;   CATARACT EXTRACTION W/PHACO Left 05/27/2018   Procedure: CATARACT EXTRACTION PHACO AND INTRAOCULAR LENS PLACEMENT (IOC);   Surgeon: Myrna Adine Anes, MD;  Location: [facility] ORS;  Service: Ophthalmology;  Laterality: Left;  US  03:06.2 AP% 16.1 CDE 30.16 FLUID PACK LOT @ [id number] H   CATARACT EXTRACTION W/PHACO Right 07/13/2023   Procedure: CATARACT EXTRACTION PHACO AND INTRAOCULAR LENS PLACEMENT (IOC) RIGHT DIABETIC 4.00 00:36.6;  Surgeon: Myrna Adine Anes, MD;  Location: [facility];  Service: Ophthalmology;  Laterality: Right;   CIRCUMCISION  1998   COLONOSCOPY WITH PROPOFOL  N/A 08/20/2023   Procedure: COLONOSCOPY WITH PROPOFOL ;  Surgeon: Unk Corinn Skiff, MD;  Location: [facility] ENDOSCOPY;  Service: Gastroenterology;  Laterality: N/A;   EYE SURGERY Left    Retina Detachment   GRAFT APPLICATION Right 05/29/2017   Procedure: GRAFT APPLICATION ( RIGHT FOOT );  Surgeon: Shawl Cordella KANDICE, MD;  Location: [facility] ORS;  Service: Vascular;  Laterality: Right;  graft taken from patients right thigh   IRRIGATION AND DEBRIDEMENT FOOT Right 10/03/2016   Procedure: IRRIGATION AND DEBRIDEMENT FOOT;  Surgeon: Eva Gay, DPM;  Location: [facility] ORS;  Service: Podiatry;  Laterality: Right;   LOWER EXTREMITY ANGIOGRAPHY Right 02/24/2023   Procedure: Lower Extremity Angiography;  Surgeon: Shawl Cordella KANDICE, MD;  Location: [facility];  Service: Cardiovascular;  Laterality: Right;   LOWER EXTREMITY INTERVENTION  10/10/2016   Procedure: Lower Extremity Intervention;  Surgeon: Cordella KANDICE Shawl, MD;  Location: [facility];  Service: Cardiovascular;;   PERIPHERAL VASCULAR BALLOON ANGIOPLASTY Left 10/10/2016   Procedure: Peripheral Vascular Balloon Angioplasty;  Surgeon: Cordella KANDICE Shawl, MD;  Location: [facility] CV [facility];  Service: Cardiovascular;  Laterality: Left;   TONSILLECTOMY     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/2022   Procedure: TRANSMETATARSAL AMPUTATION;  Surgeon: Gay Eva, DPM;  Location: [facility] ORS;  Service: Podiatry;  Laterality: Right;   WOUND DEBRIDEMENT Right 11/07/2016   Procedure:  DEBRIDEMENT OF WOUND AND BONE RIGHT FOOT AND APPLY WOUND VAC;  Surgeon: Eva Gay, DPM;  Location: [facility] ORS;  Service: Podiatry;  Laterality: Right;    FAMILY HISTORY: family history includes Diabetes in his father and mother; Hypertension in his father and mother.  SOCIAL HISTORY:  reports that he quit smoking about 49 years ago. His smoking use included pipe and cigarettes. He has never used smokeless tobacco. He reports that he does not drink alcohol and does not use drugs.  ALLERGIES: Sulfa antibiotics and Piperacillin -tazobactam in dex  MEDICATIONS:  Current Outpatient Medications  Medication Sig Dispense Refill   ACCU-CHEK GUIDE test strip Use 1 strip via meter four times a day  to monitor blood glucose     Accu-Chek Softclix Lancets lancets      acetaminophen  (TYLENOL ) 325 MG tablet Take 650 mg by mouth every 6 (six) hours as needed.     apalutamide (ERLEADA) 60 MG tablet Take 4 tablets (240 mg total) by mouth daily. 120 tablet 0   aspirin  EC 81 MG tablet Take 81 mg by mouth daily. Swallow whole.     atorvastatin  (LIPITOR) 10 MG tablet Take 10 mg by mouth every evening.     cefTRIAXone  (ROCEPHIN ) IVPB 2 g daily.     cetirizine (ZYRTEC) 10 MG tablet Take 10 mg by mouth daily.     cyanocobalamin  1000 MCG tablet Take 1 tablet (1,000 mcg total) by mouth daily.     DROPLET INSULIN  SYRINGE 31G X 5/16 0.3 ML MISC USE FIVE TIMES DAILY AS DIRECTED     FEROSUL 325 (65 Fe) MG tablet Take 325 mg by mouth 2 (two) times daily with a meal.     glucose blood (ACCU-CHEK AVIVA PLUS) test strip      insulin  glargine (LANTUS ) 100 UNIT/ML injection Inject 0.15 mLs (15 Units total) into the skin at bedtime. 10 mL 11   insulin  lispro (HUMALOG) 100 UNIT/ML injection Inject 4 Units into the skin See admin instructions. Inject 4 units SQ with breakfast, inject 4 units SQ with lunch and inject 4 units SQ with dinner     Insulin  Syringe-Needle U-100 31G X 5/16 0.3 ML MISC      LORazepam (ATIVAN) 1 MG  tablet Take 1 mg by mouth every 8 (eight) hours.     metFORMIN (GLUCOPHAGE) 1000 MG tablet Take 1,000 mg by mouth 2 (two) times daily with a meal.     molnupiravir EUA (LAGEVRIO) 200 MG CAPS capsule Take 4 capsules by mouth 2 (two) times daily.     ondansetron  (ZOFRAN ) 4 MG tablet Take 4 mg by mouth every 8 (eight) hours as needed for nausea or vomiting.     oxyCODONE  (OXY IR/ROXICODONE ) 5 MG immediate release tablet Take 1 tablet (5 mg total) by mouth every 4 (four) hours as needed for moderate pain (pain score 4-6). SNF use only.  Refills per SNF MD 10 tablet 0   polyethylene glycol (MIRALAX  / GLYCOLAX ) packet Take 17 g by mouth daily.     senna (SENOKOT) 8.6 MG TABS tablet Take 1 tablet by mouth 2 (two) times daily.     Simethicone  (GAS-X ULTRA STRENGTH PO) Take 1 capsule by mouth 4 (four) times daily as needed (for flatulence).     Wound Cleansers (VASHE WOUND THERAPY EX) Apply 1 Application topically daily after breakfast.     No current facility-administered medications for this encounter.  ECOG PERFORMANCE STATUS:  2 - Symptomatic, <50% confined to bed  REVIEW OF SYSTEMS: Patient has significant comorbidities including arthritis bladder incontinence chronic renal disease diabetes mellitus DVT morbid obesity hypertension Patient denies any weight loss, fatigue, weakness, fever, chills or night sweats. Patient denies any loss of vision, blurred vision. Patient denies any ringing  of the ears or hearing loss. No irregular heartbeat. Patient denies heart murmur or history of fainting. Patient denies any chest pain or pain radiating to her upper extremities. Patient denies any shortness of breath, difficulty breathing at night, cough or hemoptysis. Patient denies any swelling in the lower legs. Patient denies any nausea vomiting, vomiting of blood, or coffee ground material in the vomitus. Patient denies any stomach pain. Patient states has had normal bowel movements no significant constipation  or diarrhea. Patient denies any dysuria, hematuria or significant nocturia. Patient denies any problems walking, swelling in the joints or loss of balance. Patient denies any skin changes, loss of hair or loss of weight. Patient denies any excessive worrying or anxiety or significant depression. Patient denies any problems with insomnia. Patient denies excessive thirst, polyuria, polydipsia. Patient denies any swollen glands, patient denies easy bruising or easy bleeding. Patient denies any recent infections, allergies or URI. Patient s visual fields have not changed significantly in recent time.    PHYSICAL EXAM: BP 107/63   Pulse (!) 102   Temp (!) 97.3 F (36.3 C) (Tympanic)   Resp 16   Wt 271 lb (122.9 kg)   BMI 37.80 kg/m  Wheelchair-bound male in NAD.  Deep palpation of the spine does not elicit pain.  Motor and sensory levels of the lower extremities are equal symmetric range of motion of his lower extremities does not elicit pain.  Well-developed well-nourished patient in NAD. HEENT reveals PERLA, EOMI, discs not visualized.  Oral cavity is clear. No oral mucosal lesions are identified. Neck is clear without evidence of cervical or supraclavicular adenopathy. Lungs are clear to A&P. Cardiac examination is essentially unremarkable with regular rate and rhythm without murmur rub or thrill. Abdomen is benign with no organomegaly or masses noted. Motor sensory and DTR levels are equal and symmetric in the upper and lower extremities. Cranial nerves II through XII are grossly intact. Proprioception is intact. No peripheral adenopathy or edema is identified. No motor or sensory levels are noted. Crude visual fields are within normal range.  LABORATORY DATA: Labs and pathology reviewed    RADIOLOGY RESULTS: PSMA PET scan reviewed compatible with above-stated findings   IMPRESSION: Widespread metastatic disease from stage IV prostate cancer with the chief complaint of lower back pain and  73 year old male  PLAN: At this time is to go with palliative radiation therapy to his lumbar spine and SI joints.  Will plan on delivering 30 Gray in 10 fractions.  Risks and benefits of treatment including skin reaction fatigue alteration blood counts possible diarrhea all were described in detail to the patient.  I have personally set up and ordered CT simulation for early next week.  Patient comprehends my recommendations well.  I would like to take this opportunity to thank you for allowing me to participate in the care of your patient.SABRA Marcey Penton, MD

## 2024-06-20 ENCOUNTER — Ambulatory Visit
Admission: RE | Admit: 2024-06-20 | Discharge: 2024-06-20 | Disposition: A | Discharge status: Home / Self Care | Source: Ambulatory Visit | Attending: Radiation Oncology | Admitting: Radiation Oncology

## 2024-06-20 DIAGNOSIS — Z510 Encounter for antineoplastic radiation therapy: Secondary | ICD-10-CM | POA: Diagnosis not present

## 2024-06-21 DIAGNOSIS — Z510 Encounter for antineoplastic radiation therapy: Secondary | ICD-10-CM | POA: Diagnosis not present

## 2024-06-22 ENCOUNTER — Ambulatory Visit
Admission: RE | Admit: 2024-06-22 | Discharge: 2024-06-22 | Disposition: A | Discharge status: Home / Self Care | Source: Ambulatory Visit | Attending: Radiation Oncology | Admitting: Radiation Oncology

## 2024-06-22 DIAGNOSIS — Z510 Encounter for antineoplastic radiation therapy: Secondary | ICD-10-CM | POA: Diagnosis not present

## 2024-06-23 ENCOUNTER — Ambulatory Visit
Admission: RE | Admit: 2024-06-23 | Discharge: 2024-06-23 | Disposition: A | Discharge status: Home / Self Care | Source: Ambulatory Visit | Attending: Radiation Oncology | Admitting: Radiation Oncology

## 2024-06-23 ENCOUNTER — Other Ambulatory Visit: Payer: Self-pay

## 2024-06-23 DIAGNOSIS — Z510 Encounter for antineoplastic radiation therapy: Secondary | ICD-10-CM | POA: Diagnosis not present

## 2024-06-23 LAB — RAD ONC ARIA SESSION SUMMARY
Course Elapsed Days: 0
Plan Fractions Treated to Date: 1
Plan Prescribed Dose Per Fraction: 3 Gy
Plan Total Fractions Prescribed: 10
Plan Total Prescribed Dose: 30 Gy
Reference Point Dosage Given to Date: 3 Gy
Reference Point Session Dosage Given: 3 Gy
Session Number: 1

## 2024-06-24 ENCOUNTER — Other Ambulatory Visit: Payer: Self-pay

## 2024-06-24 ENCOUNTER — Ambulatory Visit
Admission: RE | Admit: 2024-06-24 | Discharge: 2024-06-24 | Disposition: A | Discharge status: Home / Self Care | Source: Ambulatory Visit | Attending: Radiation Oncology | Admitting: Radiation Oncology

## 2024-06-24 DIAGNOSIS — Z510 Encounter for antineoplastic radiation therapy: Secondary | ICD-10-CM | POA: Diagnosis not present

## 2024-06-24 LAB — RAD ONC ARIA SESSION SUMMARY
Course Elapsed Days: 1
Plan Fractions Treated to Date: 2
Plan Prescribed Dose Per Fraction: 3 Gy
Plan Total Fractions Prescribed: 10
Plan Total Prescribed Dose: 30 Gy
Reference Point Dosage Given to Date: 6 Gy
Reference Point Session Dosage Given: 3 Gy
Session Number: 2

## 2024-06-27 ENCOUNTER — Ambulatory Visit
Admission: RE | Admit: 2024-06-27 | Discharge: 2024-06-27 | Disposition: A | Discharge status: Home / Self Care | Source: Ambulatory Visit | Attending: Radiation Oncology | Admitting: Radiation Oncology

## 2024-06-27 ENCOUNTER — Other Ambulatory Visit: Payer: Self-pay

## 2024-06-27 DIAGNOSIS — Z510 Encounter for antineoplastic radiation therapy: Secondary | ICD-10-CM | POA: Diagnosis not present

## 2024-06-27 LAB — RAD ONC ARIA SESSION SUMMARY
Course Elapsed Days: 4
Plan Fractions Treated to Date: 3
Plan Prescribed Dose Per Fraction: 3 Gy
Plan Total Fractions Prescribed: 10
Plan Total Prescribed Dose: 30 Gy
Reference Point Dosage Given to Date: 9 Gy
Reference Point Session Dosage Given: 3 Gy
Session Number: 3

## 2024-06-28 ENCOUNTER — Ambulatory Visit

## 2024-06-29 ENCOUNTER — Ambulatory Visit
Admission: RE | Admit: 2024-06-29 | Discharge: 2024-06-29 | Disposition: A | Discharge status: Home / Self Care | Source: Ambulatory Visit | Attending: Radiation Oncology | Admitting: Radiation Oncology

## 2024-06-29 ENCOUNTER — Inpatient Hospital Stay

## 2024-06-29 ENCOUNTER — Other Ambulatory Visit: Payer: Self-pay

## 2024-06-29 DIAGNOSIS — Z510 Encounter for antineoplastic radiation therapy: Secondary | ICD-10-CM | POA: Diagnosis not present

## 2024-06-29 LAB — RAD ONC ARIA SESSION SUMMARY
Course Elapsed Days: 6
Plan Fractions Treated to Date: 4
Plan Prescribed Dose Per Fraction: 3 Gy
Plan Total Fractions Prescribed: 10
Plan Total Prescribed Dose: 30 Gy
Reference Point Dosage Given to Date: 12 Gy
Reference Point Session Dosage Given: 3 Gy
Session Number: 4

## 2024-06-30 ENCOUNTER — Other Ambulatory Visit: Payer: Self-pay

## 2024-06-30 ENCOUNTER — Ambulatory Visit
Admission: RE | Admit: 2024-06-30 | Discharge: 2024-06-30 | Disposition: A | Discharge status: Home / Self Care | Source: Ambulatory Visit | Attending: Radiation Oncology | Admitting: Radiation Oncology

## 2024-06-30 DIAGNOSIS — Z510 Encounter for antineoplastic radiation therapy: Secondary | ICD-10-CM | POA: Diagnosis not present

## 2024-06-30 LAB — RAD ONC ARIA SESSION SUMMARY
Course Elapsed Days: 7
Plan Fractions Treated to Date: 5
Plan Prescribed Dose Per Fraction: 3 Gy
Plan Total Fractions Prescribed: 10
Plan Total Prescribed Dose: 30 Gy
Reference Point Dosage Given to Date: 15 Gy
Reference Point Session Dosage Given: 3 Gy
Session Number: 5

## 2024-07-01 ENCOUNTER — Other Ambulatory Visit: Payer: Self-pay

## 2024-07-01 ENCOUNTER — Ambulatory Visit
Admission: RE | Admit: 2024-07-01 | Discharge: 2024-07-01 | Disposition: A | Discharge status: Home / Self Care | Source: Ambulatory Visit | Attending: Radiation Oncology | Admitting: Radiation Oncology

## 2024-07-01 DIAGNOSIS — Z510 Encounter for antineoplastic radiation therapy: Secondary | ICD-10-CM | POA: Diagnosis not present

## 2024-07-01 LAB — RAD ONC ARIA SESSION SUMMARY
Course Elapsed Days: 8
Plan Fractions Treated to Date: 6
Plan Prescribed Dose Per Fraction: 3 Gy
Plan Total Fractions Prescribed: 10
Plan Total Prescribed Dose: 30 Gy
Reference Point Dosage Given to Date: 18 Gy
Reference Point Session Dosage Given: 3 Gy
Session Number: 6

## 2024-07-04 ENCOUNTER — Other Ambulatory Visit: Payer: Self-pay

## 2024-07-04 ENCOUNTER — Ambulatory Visit
Admission: RE | Admit: 2024-07-04 | Discharge: 2024-07-04 | Disposition: A | Discharge status: Home / Self Care | Source: Ambulatory Visit | Attending: Radiation Oncology | Admitting: Radiation Oncology

## 2024-07-04 DIAGNOSIS — Z510 Encounter for antineoplastic radiation therapy: Secondary | ICD-10-CM | POA: Diagnosis present

## 2024-07-04 DIAGNOSIS — C61 Malignant neoplasm of prostate: Secondary | ICD-10-CM | POA: Diagnosis present

## 2024-07-04 DIAGNOSIS — C7951 Secondary malignant neoplasm of bone: Secondary | ICD-10-CM | POA: Diagnosis present

## 2024-07-04 LAB — RAD ONC ARIA SESSION SUMMARY
Course Elapsed Days: 11
Plan Fractions Treated to Date: 7
Plan Prescribed Dose Per Fraction: 3 Gy
Plan Total Fractions Prescribed: 10
Plan Total Prescribed Dose: 30 Gy
Reference Point Dosage Given to Date: 21 Gy
Reference Point Session Dosage Given: 3 Gy
Session Number: 7

## 2024-07-05 ENCOUNTER — Other Ambulatory Visit: Payer: Self-pay

## 2024-07-05 ENCOUNTER — Ambulatory Visit
Admission: RE | Admit: 2024-07-05 | Discharge: 2024-07-05 | Disposition: A | Discharge status: Home / Self Care | Source: Ambulatory Visit | Attending: Radiation Oncology | Admitting: Radiation Oncology

## 2024-07-05 DIAGNOSIS — Z510 Encounter for antineoplastic radiation therapy: Secondary | ICD-10-CM | POA: Diagnosis not present

## 2024-07-05 LAB — RAD ONC ARIA SESSION SUMMARY
Course Elapsed Days: 12
Plan Fractions Treated to Date: 8
Plan Prescribed Dose Per Fraction: 3 Gy
Plan Total Fractions Prescribed: 10
Plan Total Prescribed Dose: 30 Gy
Reference Point Dosage Given to Date: 24 Gy
Reference Point Session Dosage Given: 3 Gy
Session Number: 8

## 2024-07-06 ENCOUNTER — Other Ambulatory Visit: Payer: Self-pay

## 2024-07-06 ENCOUNTER — Ambulatory Visit
Admission: RE | Admit: 2024-07-06 | Discharge: 2024-07-06 | Disposition: A | Source: Ambulatory Visit | Attending: Radiation Oncology | Admitting: Radiation Oncology

## 2024-07-06 ENCOUNTER — Other Ambulatory Visit (HOSPITAL_COMMUNITY): Payer: Self-pay

## 2024-07-06 ENCOUNTER — Other Ambulatory Visit: Payer: Self-pay | Admitting: Internal Medicine

## 2024-07-06 DIAGNOSIS — C61 Malignant neoplasm of prostate: Secondary | ICD-10-CM

## 2024-07-06 DIAGNOSIS — Z510 Encounter for antineoplastic radiation therapy: Secondary | ICD-10-CM | POA: Diagnosis not present

## 2024-07-06 LAB — RAD ONC ARIA SESSION SUMMARY
Course Elapsed Days: 13
Plan Fractions Treated to Date: 9
Plan Prescribed Dose Per Fraction: 3 Gy
Plan Total Fractions Prescribed: 10
Plan Total Prescribed Dose: 30 Gy
Reference Point Dosage Given to Date: 27 Gy
Reference Point Session Dosage Given: 3 Gy
Session Number: 9

## 2024-07-06 MED ORDER — APALUTAMIDE 60 MG PO TABS
240.0000 mg | ORAL_TABLET | Freq: Every day | ORAL | 0 refills | Status: DC
  Filled 2024-07-06 – 2024-07-07 (×2): qty 120, 30d supply, fill #0

## 2024-07-07 ENCOUNTER — Other Ambulatory Visit

## 2024-07-07 ENCOUNTER — Other Ambulatory Visit: Payer: Self-pay

## 2024-07-07 ENCOUNTER — Encounter: Payer: Self-pay | Admitting: Nurse Practitioner

## 2024-07-07 ENCOUNTER — Ambulatory Visit

## 2024-07-07 ENCOUNTER — Ambulatory Visit
Admission: RE | Admit: 2024-07-07 | Discharge: 2024-07-07 | Disposition: A | Source: Ambulatory Visit | Attending: Radiation Oncology | Admitting: Radiation Oncology

## 2024-07-07 ENCOUNTER — Other Ambulatory Visit: Payer: Self-pay | Admitting: *Deleted

## 2024-07-07 ENCOUNTER — Encounter: Admitting: Hospice and Palliative Medicine

## 2024-07-07 ENCOUNTER — Inpatient Hospital Stay

## 2024-07-07 ENCOUNTER — Ambulatory Visit: Admitting: Internal Medicine

## 2024-07-07 ENCOUNTER — Inpatient Hospital Stay: Admitting: Nurse Practitioner

## 2024-07-07 ENCOUNTER — Inpatient Hospital Stay (HOSPITAL_BASED_OUTPATIENT_CLINIC_OR_DEPARTMENT_OTHER): Admitting: Hospice and Palliative Medicine

## 2024-07-07 VITALS — BP 108/58 | HR 90 | Temp 96.7°F | Resp 18 | Ht 71.0 in | Wt 285.0 lb

## 2024-07-07 VITALS — BP 112/60 | HR 99 | Temp 98.6°F | Resp 19

## 2024-07-07 DIAGNOSIS — C61 Malignant neoplasm of prostate: Secondary | ICD-10-CM

## 2024-07-07 DIAGNOSIS — Z87891 Personal history of nicotine dependence: Secondary | ICD-10-CM | POA: Insufficient documentation

## 2024-07-07 DIAGNOSIS — Z79899 Other long term (current) drug therapy: Secondary | ICD-10-CM | POA: Diagnosis not present

## 2024-07-07 DIAGNOSIS — D649 Anemia, unspecified: Secondary | ICD-10-CM | POA: Insufficient documentation

## 2024-07-07 DIAGNOSIS — N1831 Chronic kidney disease, stage 3a: Secondary | ICD-10-CM | POA: Insufficient documentation

## 2024-07-07 DIAGNOSIS — Z515 Encounter for palliative care: Secondary | ICD-10-CM | POA: Insufficient documentation

## 2024-07-07 DIAGNOSIS — C7951 Secondary malignant neoplasm of bone: Secondary | ICD-10-CM | POA: Insufficient documentation

## 2024-07-07 DIAGNOSIS — E1122 Type 2 diabetes mellitus with diabetic chronic kidney disease: Secondary | ICD-10-CM | POA: Insufficient documentation

## 2024-07-07 DIAGNOSIS — Z510 Encounter for antineoplastic radiation therapy: Secondary | ICD-10-CM | POA: Diagnosis not present

## 2024-07-07 DIAGNOSIS — Z923 Personal history of irradiation: Secondary | ICD-10-CM | POA: Insufficient documentation

## 2024-07-07 DIAGNOSIS — I739 Peripheral vascular disease, unspecified: Secondary | ICD-10-CM | POA: Insufficient documentation

## 2024-07-07 DIAGNOSIS — D509 Iron deficiency anemia, unspecified: Secondary | ICD-10-CM

## 2024-07-07 LAB — CMP (CANCER CENTER ONLY)
ALT: 15 U/L (ref 0–44)
AST: 19 U/L (ref 15–41)
Albumin: 3.6 g/dL (ref 3.5–5.0)
Alkaline Phosphatase: 176 U/L — ABNORMAL HIGH (ref 38–126)
Anion gap: 8 (ref 5–15)
BUN: 17 mg/dL (ref 8–23)
CO2: 23 mmol/L (ref 22–32)
Calcium: 8.6 mg/dL — ABNORMAL LOW (ref 8.9–10.3)
Chloride: 105 mmol/L (ref 98–111)
Creatinine: 0.84 mg/dL (ref 0.61–1.24)
GFR, Estimated: >60 mL/min (ref >60)
Glucose, Bld: 62 mg/dL — ABNORMAL LOW (ref 70–99)
Potassium: 4.2 mmol/L (ref 3.5–5.1)
Sodium: 136 mmol/L (ref 135–145)
Total Bilirubin: 0.5 mg/dL (ref 0.0–1.2)
Total Protein: 7.7 g/dL (ref 6.5–8.1)

## 2024-07-07 LAB — CBC WITH DIFFERENTIAL (CANCER CENTER ONLY)
Abs Immature Granulocytes: 0.01 K/uL (ref 0.00–0.07)
Basophils Absolute: 0 K/uL (ref 0.0–0.1)
Basophils Relative: 0 %
Eosinophils Absolute: 0.2 K/uL (ref 0.0–0.5)
Eosinophils Relative: 8 %
HCT: 28.1 % — ABNORMAL LOW (ref 39.0–52.0)
Hemoglobin: 9 g/dL — ABNORMAL LOW (ref 13.0–17.0)
Immature Granulocytes: 0 %
Lymphocytes Relative: 7 %
Lymphs Abs: 0.2 K/uL — ABNORMAL LOW (ref 0.7–4.0)
MCH: 28.8 pg (ref 26.0–34.0)
MCHC: 32 g/dL (ref 30.0–36.0)
MCV: 89.8 fL (ref 80.0–100.0)
Monocytes Absolute: 0.3 K/uL (ref 0.1–1.0)
Monocytes Relative: 10 %
Neutro Abs: 2.1 K/uL (ref 1.7–7.7)
Neutrophils Relative %: 75 %
Platelet Count: 227 K/uL (ref 150–400)
RBC: 3.13 MIL/uL — ABNORMAL LOW (ref 4.22–5.81)
RDW: 17.7 % — ABNORMAL HIGH (ref 11.5–15.5)
WBC Count: 2.7 K/uL — ABNORMAL LOW (ref 4.0–10.5)
nRBC: 0 % (ref 0.0–0.2)

## 2024-07-07 LAB — RAD ONC ARIA SESSION SUMMARY
Course Elapsed Days: 14
Plan Fractions Treated to Date: 10
Plan Prescribed Dose Per Fraction: 3 Gy
Plan Total Fractions Prescribed: 10
Plan Total Prescribed Dose: 30 Gy
Reference Point Dosage Given to Date: 30 Gy
Reference Point Session Dosage Given: 3 Gy
Session Number: 10

## 2024-07-07 LAB — PSA: Prostatic Specific Antigen: 189.21 ng/mL — ABNORMAL HIGH (ref 0.00–4.00)

## 2024-07-07 MED ORDER — DEGARELIX ACETATE 80 MG ~~LOC~~ SOLR
80.0000 mg | Freq: Once | SUBCUTANEOUS | Status: AC
  Administered 2024-07-07: 80 mg via SUBCUTANEOUS
  Filled 2024-07-07: qty 4

## 2024-07-07 MED ORDER — IRON SUCROSE 20 MG/ML IV SOLN
200.0000 mg | Freq: Once | INTRAVENOUS | Status: AC
  Administered 2024-07-07: 200 mg via INTRAVENOUS
  Filled 2024-07-07: qty 10

## 2024-07-07 NOTE — Progress Notes (Signed)
 Palliative Medicine [facility] Telephone:([phone] Fax:([phone]) [phone]   Name: [name] Date: 07/07/2024 MRN: [id number]  DOB: [birth year]  Patient Care Team: Adina Buel HERO, MD as PCP - General (Family Medicine) [name], MD as Consulting Physician (Oncology) Lenn Aran, MD as Consulting Physician (Radiation Oncology)    REASON FOR CONSULTATION: [name] is a 74 y.o. 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  Patient was referred to palliative care to address goals and manage ongoing symptoms.  SOCIAL HISTORY:     reports that he quit smoking about 49 years ago. His smoking use included pipe and cigarettes. He has never used smokeless tobacco. He reports that he does not drink alcohol and does not use drugs.  Patient was never married and has no children.  He lives with his sister.  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  He completed 3 years of education at [employer] where he studied religion.  Patient dropped out of college to care for his family when his father became ill.  ADVANCE DIRECTIVES:  Not on file  CODE STATUS: Full code  PAST MEDICAL HISTORY: Past Medical History:  Diagnosis Date   Arthritis    Bladder incontinence    Chronic kidney disease    Stage 3 Kidney disease   Diabetes mellitus without complication (HCC)    DVT of leg (deep venous thrombosis) (HCC)    H/O RIGHT LEG 2018   Dyspnea    occassional   Hyperlipidemia    Hypertension    Peripheral vascular disease    Sepsis (HCC)     PAST SURGICAL HISTORY:  Past Surgical History:  Procedure Laterality Date   ABDOMINAL AORTOGRAM W/LOWER EXTREMITY N/A 10/10/2016   Procedure: Abdominal Aortogram w/Lower Extremity;  Surgeon: Cordella KANDICE Shawl, MD;  Location: [facility] INV[facility] CV [facility];  Service:  Cardiovascular;  Laterality: N/A;   CATARACT EXTRACTION W/PHACO Left 05/27/2018   Procedure: CATARACT EXTRACTION PHACO AND INTRAOCULAR LENS PLACEMENT (IOC);  Surgeon: Myrna Adine Anes, MD;  Location: [facility] ORS;  Service: Ophthalmology;  Laterality: Left;  US  03:06.2 AP% 16.1 CDE 30.16 FLUID PACK LOT @ [id number] H   CATARACT EXTRACTION W/PHACO Right 07/13/2023   Procedure: CATARACT EXTRACTION PHACO AND INTRAOCULAR LENS PLACEMENT (IOC) RIGHT DIABETIC 4.00 00:36.6;  Surgeon: Myrna Adine Anes, MD;  Location: [facility];  Service: Ophthalmology;  Laterality: Right;   CIRCUMCISION  1998   COLONOSCOPY WITH PROPOFOL  N/A 08/20/2023   Procedure: COLONOSCOPY WITH PROPOFOL ;  Surgeon: Unk Corinn Skiff, MD;  Location: [facility] ENDOSCOPY;  Service: Gastroenterology;  Laterality: N/A;   EYE SURGERY Left    Retina Detachment   GRAFT APPLICATION Right 05/29/2017   Procedure: GRAFT APPLICATION ( RIGHT FOOT );  Surgeon: Shawl Cordella KANDICE, MD;  Location: [facility] ORS;  Service: Vascular;  Laterality: Right;  graft taken from patients right thigh   IRRIGATION AND DEBRIDEMENT FOOT Right 10/03/2016   Procedure: IRRIGATION AND DEBRIDEMENT FOOT;  Surgeon: Eva Gay, DPM;  Location: [facility] ORS;  Service: Podiatry;  Laterality: Right;   LOWER EXTREMITY ANGIOGRAPHY Right 02/24/2023   Procedure: Lower Extremity Angiography;  Surgeon: Shawl Cordella KANDICE, MD;  Location: [facility] CV LAB;  Service: Cardiovascular;  Laterality: Right;   LOWER EXTREMITY INTERVENTION  10/10/2016   Procedure: Lower Extremity Intervention;  Surgeon: Cordella KANDICE Shawl, MD;  [facility] INVASIVE  CV LAB;  Service: Cardiovascular;;   PERIPHERAL VASCULAR BALLOON ANGIOPLASTY Left 10/10/2016   Procedure: Peripheral Vascular Balloon Angioplasty;  Surgeon: Cordella KANDICE Shawl, MD;  Location: [facility] INVASIVE CV LAB;  Service: Cardiovascular;  Laterality: Left;   TONSILLECTOMY     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/2022   Procedure: TRANSMETATARSAL  AMPUTATION;  Surgeon: Ashley Soulier, DPM;  Location: [facility] ORS;  Service: Podiatry;  Laterality: Right;   WOUND DEBRIDEMENT Right 11/07/2016   Procedure: DEBRIDEMENT OF WOUND AND BONE RIGHT FOOT AND APPLY WOUND VAC;  Surgeon: Soulier Ashley, DPM;  Location: [facility] ORS;  Service: Podiatry;  Laterality: Right;    HEMATOLOGY/ONCOLOGY HISTORY:  Oncology History Overview Note  # AUG 2025- [hospital]- Metastatic prostate cancer-castrate sensitive de novo; high volume-awaiting r Tempus. Bx  IR:  METASTIC PROSTATE ADENOCARCINOMA  # on Firmagon  q 4 weeks. awaiting  radiatio oncology re: prostate cancer- mets to bone  #    Prostate cancer metastatic to multiple sites [facility])  04/20/2024 Initial Diagnosis   Prostate cancer metastatic to multiple sites [facility])   05/10/2024 Cancer Staging   Staging form: Prostate, AJCC 8th Edition - Clinical: Stage IVB (cTX, cNX, pM1b) - Signed by [name], MD on 05/10/2024     ALLERGIES:  is allergic to sulfa antibiotics and piperacillin -tazobactam in dex.  MEDICATIONS:  Current Outpatient Medications  Medication Sig Dispense Refill   ACCU-CHEK GUIDE test strip Use 1 strip via meter four times a day  to monitor blood glucose     Accu-Chek Softclix Lancets lancets      acetaminophen  (TYLENOL ) 325 MG tablet Take 650 mg by mouth every 6 (six) hours as needed. (Patient not taking: Reported on 07/07/2024)     apalutamide (ERLEADA) 60 MG tablet Take 4 tablets (240 mg total) by mouth daily. 120 tablet 0   aspirin  EC 81 MG tablet Take 81 mg by mouth daily. Swallow whole.     atorvastatin  (LIPITOR) 10 MG tablet Take 10 mg by mouth every evening.     cetirizine (ZYRTEC) 10 MG tablet Take 10 mg by mouth daily.     cyanocobalamin  1000 MCG tablet Take 1 tablet (1,000 mcg total) by mouth daily. (Patient not taking: Reported on 07/07/2024)     DROPLET INSULIN  SYRINGE 31G X 5/16 0.3 ML MISC USE FIVE TIMES DAILY AS DIRECTED     FEROSUL 325 (65 Fe) MG tablet Take 325 mg by  mouth 2 (two) times daily with a meal. (Patient not taking: Reported on 07/07/2024)     glucose blood (ACCU-CHEK AVIVA PLUS) test strip      insulin  glargine (LANTUS ) 100 UNIT/ML injection Inject 0.15 mLs (15 Units total) into the skin at bedtime. 10 mL 11   insulin  lispro (HUMALOG) 100 UNIT/ML injection Inject 4 Units into the skin See admin instructions. Inject 4 units SQ with breakfast, inject 4 units SQ with lunch and inject 4 units SQ with dinner     Insulin  Syringe-Needle U-100 31G X 5/16 0.3 ML MISC      LORazepam (ATIVAN) 1 MG tablet Take 1 mg by mouth every 8 (eight) hours.     metFORMIN (GLUCOPHAGE) 1000 MG tablet Take 1,000 mg by mouth 2 (two) times daily with a meal.     ondansetron  (ZOFRAN ) 4 MG tablet Take 4 mg by mouth every 8 (eight) hours as needed for nausea or vomiting. (Patient not taking: Reported on 07/07/2024)     oxyCODONE  (OXY IR/ROXICODONE ) 5 MG immediate release tablet Take 1 tablet (5 mg total)  by mouth every 4 (four) hours as needed for moderate pain (pain score 4-6). SNF use only.  Refills per SNF MD (Patient not taking: Reported on 07/07/2024) 10 tablet 0   polyethylene glycol (MIRALAX  / GLYCOLAX ) packet Take 17 g by mouth daily.     senna (SENOKOT) 8.6 MG TABS tablet Take 1 tablet by mouth 2 (two) times daily.     Simethicone  (GAS-X ULTRA STRENGTH PO) Take 1 capsule by mouth 4 (four) times daily as needed (for flatulence).     Wound Cleansers (VASHE WOUND THERAPY EX) Apply 1 Application topically daily after breakfast.     No current facility-administered medications for this visit.   Facility-Administered Medications Ordered in Other Visits  Medication Dose Route Frequency Provider Last Rate Last Admin   degarelix  (FIRMAGON ) injection 80 mg  80 mg Subcutaneous Once Brahmanday, Govinda R, MD       iron  sucrose (VENOFER ) injection 200 mg  200 mg Intravenous Once Brahmanday, Govinda R, MD        VITAL SIGNS: There were no vitals taken for this visit. There were no  vitals filed for this visit.  Estimated body mass index is 39.75 kg/m as calculated from the following:   Height as of an earlier encounter on 07/07/24: 5' 11 (1.803 m).   Weight as of an earlier encounter on 07/07/24: 285 lb (129.3 kg).  LABS: CBC:    Component Value Date/Time   WBC 2.7 (L) 07/07/2024 1319   WBC 4.7 04/20/2024 0532   HGB 9.0 (L) 07/07/2024 1319   HCT 28.1 (L) 07/07/2024 1319   PLT 227 07/07/2024 1319   MCV 89.8 07/07/2024 1319   NEUTROABS 2.1 07/07/2024 1319   LYMPHSABS 0.2 (L) 07/07/2024 1319   MONOABS 0.3 07/07/2024 1319   EOSABS 0.2 07/07/2024 1319   BASOSABS 0.0 07/07/2024 1319   Comprehensive Metabolic Panel:    Component Value Date/Time   NA 136 07/07/2024 1319   NA 140 04/04/2013 1238   K 4.2 07/07/2024 1319   K 4.1 04/04/2013 1238   CL 105 07/07/2024 1319   CL 108 (H) 04/04/2013 1238   CO2 23 07/07/2024 1319   CO2 30 04/04/2013 1238   BUN 17 07/07/2024 1319   BUN 19 (H) 04/04/2013 1238   CREATININE 0.84 07/07/2024 1319   CREATININE 0.87 04/04/2013 1238   GLUCOSE 62 (L) 07/07/2024 1319   GLUCOSE 98 04/04/2013 1238   CALCIUM  8.6 (L) 07/07/2024 1319   CALCIUM  8.8 04/04/2013 1238   AST 19 07/07/2024 1319   ALT 15 07/07/2024 1319   ALKPHOS 176 (H) 07/07/2024 1319   BILITOT 0.5 07/07/2024 1319   PROT 7.7 07/07/2024 1319   ALBUMIN 3.6 07/07/2024 1319    RADIOGRAPHIC STUDIES: No results found.  PERFORMANCE STATUS (ECOG) : 2 - Symptomatic, <50% confined to bed  Review of Systems Unless otherwise noted, a complete review of systems is negative.  Physical Exam General: NAD Pulmonary: unlabored Extremities: no edema, no joint deformities Skin: no rashes Neurological: Weakness but otherwise nonfocal  IMPRESSION: Follow up visit.   Patient doing well. Says he has moved back in with his sister. He is receiving home health PT. Mostly still in wheelchair but is at times ambulatory with use of a walker. He requests home health aide but plans  to discuss availability with his home health company. Will refer to community palliative care.   Patient says pain has resolved with XRT. He denies any symptomatic complaints or concerns today.   PLAN: -  Continue current scope of treatment - Referral to community palliative care - Follow up telephone visit 6 months   Patient expressed understanding and was in agreement with this plan. He also understands that He can call the clinic at any time with any questions, concerns, or complaints.     Time Total: 15 minutes  Visit consisted of counseling and education dealing with the complex and emotionally intense issues of symptom management and palliative care in the setting of serious and potentially life-threatening illness.Greater than 50%  of this time was spent counseling and coordinating care related to the above assessment and plan.  Signed by: Fonda Mower, PhD, NP-C

## 2024-07-07 NOTE — Progress Notes (Signed)
 [facility] CONSULT NOTE  Patient Care Team: Adina Buel HERO, MD as PCP - General (Family Medicine) [name], MD as Consulting Physician (Oncology) Lenn Aran, MD as Consulting Physician (Radiation Oncology)  CHIEF COMPLAINTS/PURPOSE OF CONSULTATION: Prostate cancer  Oncology History Overview Note  # AUG 2025- [hospital]- Metastatic prostate cancer-castrate sensitive de novo; high volume-awaiting r Tempus. Bx  IR:  METASTIC PROSTATE ADENOCARCINOMA  # on Firmagon  q 4 weeks. awaiting  radiatio oncology re: prostate cancer- mets to bone  #    Prostate cancer metastatic to multiple sites [facility])  04/20/2024 Initial Diagnosis   Prostate cancer metastatic to multiple sites [facility])   05/10/2024 Cancer Staging   Staging form: Prostate, AJCC 8th Edition - Clinical: Stage IVB (cTX, cNX, pM1b) - Signed by [name], MD on 05/10/2024     Latest Reference Range & Units 04/17/24 02:36  Prostatic Specific Antigen 0.00 - 4.00 ng/mL 1,123.11 (H)  (H): Data is abnormally high  HISTORY OF PRESENTING ILLNESS: Patient ambulating- independently.   Alone.   [name] 73 y.o.  male pleasant patient with metastatic castrate sensitive prostate cancer, high risk/high volume, on ADT/firmagon , now on apalutamide, started 06/14/24, who returns to clinic for follow up. He has medical history significant for chronic right foot ulcer, CKD IIIa, HTN, DM and anemia. Back pain has now resolved. He completed radiation today. Tolerating erleadawell without significant side effects. Denies complaints today.   Review of Systems  Constitutional:  Positive for malaise/fatigue. Negative for chills, diaphoresis, fever and weight loss.  HENT:  Negative for nosebleeds and sore throat.   Eyes:  Negative for double vision.  Respiratory:  Negative for cough, hemoptysis, sputum production, shortness of breath and wheezing.   Cardiovascular:  Negative for chest pain, palpitations,  orthopnea and leg swelling.  Gastrointestinal:  Negative for abdominal pain, blood in stool, constipation, diarrhea, heartburn, melena, nausea and vomiting.  Genitourinary:  Negative for dysuria, frequency and urgency.  Musculoskeletal:  Positive for back pain and joint pain.  Skin: Negative.  Negative for itching and rash.  Neurological:  Negative for dizziness, tingling, focal weakness, weakness and headaches.  Endo/Heme/Allergies:  Does not bruise/bleed easily.  Psychiatric/Behavioral:  Negative for depression. The patient is not nervous/anxious and does not have insomnia.    MEDICAL HISTORY:  Past Medical History:  Diagnosis Date   Arthritis    Bladder incontinence    Chronic kidney disease    Stage 3 Kidney disease   Diabetes mellitus without complication (HCC)    DVT of leg (deep venous thrombosis) (HCC)    H/O RIGHT LEG 2018   Dyspnea    occassional   Hyperlipidemia    Hypertension    Peripheral vascular disease    Sepsis (HCC)     SURGICAL HISTORY: Past Surgical History:  Procedure Laterality Date   ABDOMINAL AORTOGRAM W/LOWER EXTREMITY N/A 10/10/2016   Procedure: Abdominal Aortogram w/Lower Extremity;  Surgeon: Cordella KANDICE Shawl, MD;  Location: [facility] INVASIVE CV LAB;  Service: Cardiovascular;  Laterality: N/A;   CATARACT EXTRACTION W/PHACO Left 05/27/2018   Procedure: CATARACT EXTRACTION PHACO AND INTRAOCULAR LENS PLACEMENT (IOC);  Surgeon: Myrna Adine Anes, MD;  Location: [facility] ORS;  Service: Ophthalmology;  Laterality: Left;  US  03:06.2 AP% 16.1 CDE 30.16 FLUID PACK LOT @ [id number] H   CATARACT EXTRACTION W/PHACO Right 07/13/2023   Procedure: CATARACT EXTRACTION PHACO AND INTRAOCULAR LENS PLACEMENT (IOC) RIGHT DIABETIC 4.00 00:36.6;  Surgeon: Myrna Adine Anes, MD;  Location: [facility];  Service:  Ophthalmology;  Laterality: Right;   CIRCUMCISION  1998   COLONOSCOPY WITH PROPOFOL  N/A 08/20/2023   Procedure: COLONOSCOPY WITH PROPOFOL ;  Surgeon: Unk Corinn Skiff, MD;  Location: [facility] ENDOSCOPY;  Service: Gastroenterology;  Laterality: N/A;   EYE SURGERY Left    Retina Detachment   GRAFT APPLICATION Right 05/29/2017   Procedure: GRAFT APPLICATION ( RIGHT FOOT );  Surgeon: Jama Cordella MATSU, MD;  Location: [facility] ORS;  Service: Vascular;  Laterality: Right;  graft taken from patients right thigh   IRRIGATION AND DEBRIDEMENT FOOT Right 10/03/2016   Procedure: IRRIGATION AND DEBRIDEMENT FOOT;  Surgeon: Eva Gay, DPM;  Location: [facility] ORS;  Service: Podiatry;  Laterality: Right;   LOWER EXTREMITY ANGIOGRAPHY Right 02/24/2023   Procedure: Lower Extremity Angiography;  Surgeon: Jama Cordella MATSU, MD;  Location: [facility] INVASIVE CV LAB;  Service: Cardiovascular;  Laterality: Right;   LOWER EXTREMITY INTERVENTION  10/10/2016   Procedure: Lower Extremity Intervention;  Surgeon: Cordella MATSU Jama, MD;  Location: [facility] INVASIVE CV LAB;  Service: Cardiovascular;;   PERIPHERAL VASCULAR BALLOON ANGIOPLASTY Left 10/10/2016   Procedure: Peripheral Vascular Balloon Angioplasty;  Surgeon: Cordella MATSU Jama, MD;  Location: [facility] INVASIVE CV LAB;  Service: Cardiovascular;  Laterality: Left;   TONSILLECTOMY     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/2022   Procedure: TRANSMETATARSAL AMPUTATION;  Surgeon: Gay Eva, DPM;  Location: [facility] ORS;  Service: Podiatry;  Laterality: Right;   WOUND DEBRIDEMENT Right 11/07/2016   Procedure: DEBRIDEMENT OF WOUND AND BONE RIGHT FOOT AND APPLY WOUND VAC;  Surgeon: Eva Gay, DPM;  Location: [facility] ORS;  Service: Podiatry;  Laterality: Right;    SOCIAL HISTORY: Social History   Socioeconomic History   Marital status: Single    Spouse name: Not on file   Number of children: Not on file   Years of education: Not on file   Highest education level: Not on file  Occupational History   Not on file  Tobacco Use   Smoking status: Former    Types: Pipe, Cigarettes    Quit date: 05/23/1975    Years since quitting: 49.1   Smokeless  tobacco: Never  Vaping Use   Vaping status: Never Used  Substance and Sexual Activity   Alcohol use: No   Drug use: No   Sexual activity: Not on file  Other Topics Concern   Not on file  Social History Narrative   Not on file   Social Drivers of Health   Financial Resource Strain: Low Risk  (08/28/2023)   Received from [facility]   Overall Financial Resource Strain (CARDIA)    Difficulty of Paying Living Expenses: Not hard at all  Recent Concern: Financial Resource Strain - Medium Risk (08/06/2023)   Received from [facility]   Overall Financial Resource Strain (CARDIA)    Difficulty of Paying Living Expenses: Somewhat hard  Food Insecurity: No Food Insecurity (05/03/2024)   Hunger Vital Sign    Worried About Running Out of Food in the Last Year: Never true    Ran Out of Food in the Last Year: Never true  Transportation Needs: No Transportation Needs (05/03/2024)   PRAPARE - Administrator, Civil Service (Medical): No    Lack of Transportation (Non-Medical): No  Physical Activity: Inactive (08/28/2023)   Received from [facility]   Exercise Vital Sign    On average, how many days per week do you engage in moderate to strenuous exercise (like a brisk walk)?: 0 days  On average, how many minutes do you engage in exercise at this level?: 0 min  Stress: No Stress Concern Present (08/28/2023)   Received from [facility] - Occupational Stress Questionnaire    Feeling of Stress : Not at all  Social Connections: Moderately Integrated (04/17/2024)   Social Connection and Isolation Panel    Frequency of Communication with Friends and Family: Three times a week    Frequency of Social Gatherings with Friends and Family: Three times a week    Attends Religious Services: 1 to 4 times per year    Active Member of Clubs or Organizations: Yes    Attends Tax Inspector Meetings: 1 to 4 times per year    Marital Status: Never married  Intimate Partner Violence: Not At Risk (05/03/2024)   Humiliation, Afraid, Rape, and Kick questionnaire    Fear of Current or Ex-Partner: No    Emotionally Abused: No    Physically Abused: No    Sexually Abused: No    FAMILY HISTORY: Family History  Problem Relation Age of Onset   Diabetes Mother    Hypertension Mother    Diabetes Father    Hypertension Father     ALLERGIES:  is allergic to sulfa antibiotics and piperacillin -tazobactam in dex.  MEDICATIONS:  Current Outpatient Medications  Medication Sig Dispense Refill   ACCU-CHEK GUIDE test strip Use 1 strip via meter four times a day  to monitor blood glucose     Accu-Chek Softclix Lancets lancets      apalutamide (ERLEADA) 60 MG tablet Take 4 tablets (240 mg total) by mouth daily. 120 tablet 0   aspirin  EC 81 MG tablet Take 81 mg by mouth daily. Swallow whole.     atorvastatin  (LIPITOR) 10 MG tablet Take 10 mg by mouth every evening.     cetirizine (ZYRTEC) 10 MG tablet Take 10 mg by mouth daily.     DROPLET INSULIN  SYRINGE 31G X 5/16 0.3 ML MISC USE FIVE TIMES DAILY AS DIRECTED     glucose blood (ACCU-CHEK AVIVA PLUS) test strip      insulin  glargine (LANTUS ) 100 UNIT/ML injection Inject 0.15 mLs (15 Units total) into the skin at bedtime. 10 mL 11   insulin  lispro (HUMALOG) 100 UNIT/ML injection Inject 4 Units into the skin See admin instructions. Inject 4 units SQ with breakfast, inject 4 units SQ with lunch and inject 4 units SQ with dinner     Insulin  Syringe-Needle U-100 31G X 5/16 0.3 ML MISC      LORazepam (ATIVAN) 1 MG tablet Take 1 mg by mouth every 8 (eight) hours.     metFORMIN (GLUCOPHAGE) 1000 MG tablet Take 1,000 mg by mouth 2 (two) times daily with a meal.     polyethylene glycol (MIRALAX  / GLYCOLAX ) packet Take 17 g by mouth daily.     senna (SENOKOT) 8.6 MG TABS tablet Take 1 tablet by mouth 2 (two) times daily.     Simethicone   (GAS-X ULTRA STRENGTH PO) Take 1 capsule by mouth 4 (four) times daily as needed (for flatulence).     Wound Cleansers (VASHE WOUND THERAPY EX) Apply 1 Application topically daily after breakfast.     acetaminophen  (TYLENOL ) 325 MG tablet Take 650 mg by mouth every 6 (six) hours as needed. (Patient not taking: Reported on 07/07/2024)     cyanocobalamin  1000 MCG tablet Take 1 tablet (1,000 mcg total) by mouth daily. (Patient not taking: Reported  on 07/07/2024)     FEROSUL 325 (65 Fe) MG tablet Take 325 mg by mouth 2 (two) times daily with a meal. (Patient not taking: Reported on 07/07/2024)     ondansetron  (ZOFRAN ) 4 MG tablet Take 4 mg by mouth every 8 (eight) hours as needed for nausea or vomiting. (Patient not taking: Reported on 07/07/2024)     oxyCODONE  (OXY IR/ROXICODONE ) 5 MG immediate release tablet Take 1 tablet (5 mg total) by mouth every 4 (four) hours as needed for moderate pain (pain score 4-6). SNF use only.  Refills per SNF MD (Patient not taking: Reported on 07/07/2024) 10 tablet 0   No current facility-administered medications for this visit.    PHYSICAL EXAMINATION: Vitals:   07/07/24 1341  BP: (!) 108/58  Pulse: 90  Resp: 18  Temp: (!) 96.7 F (35.9 C)   Filed Weights   07/07/24 1341  Weight: 285 lb (129.3 kg)   Physical Exam Vitals reviewed.  Constitutional:      Appearance: He is obese. He is not ill-appearing.     Comments: Wheelchair. Accompanied.   HENT:     Head: Normocephalic and atraumatic.  Cardiovascular:     Rate and Rhythm: Normal rate and regular rhythm.  Pulmonary:     Comments: Decreased breath sounds bilaterally.  Abdominal:     Palpations: Abdomen is soft.  Musculoskeletal:        General: Normal range of motion.  Skin:    General: Skin is warm.  Neurological:     General: No focal deficit present.     Mental Status: He is alert and oriented to person, place, and time.  Psychiatric:        Behavior: Behavior normal.        Judgment: Judgment  normal.    LABORATORY DATA:  I have reviewed the data as listed Lab Results  Component Value Date   WBC 2.7 (L) 07/07/2024   HGB 9.0 (L) 07/07/2024   HCT 28.1 (L) 07/07/2024   MCV 89.8 07/07/2024   PLT 227 07/07/2024   Recent Labs    05/03/24 1331 05/31/24 0817 07/07/24 1319  NA 130* 136 136  K 5.2* 4.6 4.2  CL 97* 105 105  CO2 25 23 23   GLUCOSE 145* 134* 62*  BUN 24* 16 17  CREATININE 0.99 0.72 0.84  CALCIUM  8.9 8.7* 8.6*  GFRNONAA >60 >60 >60  PROT 8.3* 7.4 7.7  ALBUMIN 3.5 3.1* 3.6  AST 18 18 19   ALT 12 9 15   ALKPHOS 155* 139* 176*  BILITOT 0.8 0.6 0.5   Iron /TIBC/Ferritin/ %Sat    Component Value Date/Time   IRON  31 (L) 04/19/2024 0619   TIBC 200 (L) 04/19/2024 0619   FERRITIN 482 (H) 04/19/2024 0619   IRONPCTSAT 16 (L) 04/19/2024 9380     RADIOGRAPHIC STUDIES: I have personally reviewed the radiological images as listed and agreed with the findings in the report. No results found.   Assessment & Plan:   Prostate cancer metastatic to multiple sites  # AUG 2025- [hospital]- Metastatic prostate cancer-castrate sensitive de novo; high volume-awaiting r Tempus. Bx  IR:  METASTIC PROSTATE ADENOCARCINOMA  # Patient has high risk- high-volume disease, however, poor candidate for Taxotere chemotherapy. Other options including androgen receptor blockers in addition to ADT were discussed. He was felt to be a borderline candidate for zytiga given need for steroids and insulin  dependant diabetes.   # Previously reviewed PET which showed widespread bony metastasis especially in spine with significant  disease in the lumbar spine and SI joint. He has now completed palliative radiation to these areas from 06/22/24-07/07/24.   # started Erleada/apalutamide on 06/14/24. Tolerating well. Labs reviewed and acceptable for continuation of treatment.   # On firmagon  q4w.   # Anemia- likely secondary to chronic disease/underlying prostate cancer and CKD stage IIIa. Low iron   sat- started venofer . Last dose on 04/22/24. Hmg 9 today. Proceed with venofer  today. Ferritin and iron  studies pending.    # Multiple bone metastases-ongoing pain high risk for pathologic fracture. S/p radiation.    # Chronic kidney disease stage IIIa- Stable   # Peripheral vascular disease: Needing wound care. Defer to palliative care for home health aide.    # Diabetes monitor closely on current therapy.   # ACP-recommend evaluation with Josh Borders today.    # DISPOSITION: # Firmagon  today # 4 weeks- labs (cbc, cmp, psa, ferritin, iron  studies), Dr Rennie, +/- firmagon , venofer - la  No problem-specific Assessment & Plan notes found for this encounter.  Above plan of care was discussed with patient/family in detail.  My contact information was given to the patient/family.    Tinnie KANDICE Dawn, NP 07/07/2024

## 2024-07-08 ENCOUNTER — Other Ambulatory Visit: Payer: Self-pay

## 2024-07-08 NOTE — Radiation Completion Notes (Signed)
 Patient Name: [name], [name] MRN: [id number] Date of Birth: December 19, 1950 Referring Physician: SELINDA KU, M.D. Date of Service: 2024-07-08 Radiation Oncologist: Marcey Penton, M.D. [facility] -                              RADIATION ONCOLOGY END OF TREATMENT NOTE     Diagnosis: C79.51 Secondary malignant neoplasm of bone Staging on 2024-05-10: 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) T=cTX, N=cNX, M=pM1b Intent: Palliative     HPI: Patient is a 73 year old male with stage IV castrate sensitive prostate cancer.  He has been complaining of significant low back pain although recently that is improved somewhat.  He has multiple medical comorbidities including chronic right foot ulcer CKD 3 AA hypertension DM and anemia.  His PSMA PET scan was performed showing widespread bony metastasis especially in the spinal region with significant disease in the lumbar spine and SI joint region.  He is seen today for consideration of palliative radiation therapy to those areas.      ==========DELIVERED PLANS==========  First Treatment Date: 2024-06-23 Last Treatment Date: 2024-07-07   Plan Name: Spine_L Site: Lumbar Spine Technique: 3D Mode: Photon Dose Per Fraction: 3 Gy Prescribed Dose (Delivered / Prescribed): 30 Gy / 30 Gy Prescribed Fxs (Delivered / Prescribed): 10 / 10     ==========ON TREATMENT VISIT DATES========== 2024-06-30, 2024-07-05     ==========UPCOMING VISITS========== 08/05/2024 [facility] INFUSION [facility]ION [facility]AIR 1  08/05/2024 [facility] ON[facility] EST PT [name], MD  08/05/2024 [facility] LAB [facility]  08/01/2024 [facility] RAD ONCOLOGY FOLLOW UP 30 Chrystal, Glenn, MD        ==========APPENDIX - ON TREATMENT VISIT NOTES==========   See weekly On Treatment Notes in Epic for details in the Media tab (listed as Progress notes on the On Treatment Visit Dates listed above).

## 2024-07-11 ENCOUNTER — Other Ambulatory Visit: Payer: Self-pay

## 2024-07-11 NOTE — Progress Notes (Signed)
 Specialty Pharmacy Refill Coordination Note  [name] is a 73 y.o. male contacted today regarding refills of specialty medication(s) Apalutamide HUEL)   Patient requested Delivery   Delivery date: 07/12/24   Verified address: [address] [facility] [address]   Medication will be filled on: 07/11/24

## 2024-07-11 NOTE — Progress Notes (Signed)
 Specialty Pharmacy Ongoing Clinical Assessment Note  [name] is a 73 y.o. male who is being followed by the specialty pharmacy service for RxSp Oncology   Patient's specialty medication(s) reviewed today: Apalutamide (ERLEADA)   Missed doses in the last 4 weeks: 0   Patient/Caregiver did not have any additional questions or concerns.   Therapeutic benefit summary: Patient is achieving benefit   Adverse events/side effects summary: Experienced adverse events/side effects (diarrhea, pt is using Imodium PRN, and more sensitivity to cold, both remain tolerable at this time)   Patient's therapy is appropriate to: Continue    Goals Addressed             This Visit's Progress    Slow Disease Progression   No change    Patient is initiating therapy. Patient will maintain adherence         Follow up: 3 months  Silvano LOISE Dolly Specialty Pharmacist

## 2024-07-24 ENCOUNTER — Other Ambulatory Visit: Payer: Self-pay

## 2024-07-24 ENCOUNTER — Encounter: Payer: Self-pay | Admitting: Internal Medicine

## 2024-07-24 ENCOUNTER — Inpatient Hospital Stay
Admission: EM | Admit: 2024-07-24 | Discharge: 2024-08-12 | DRG: 802 | Disposition: A | Discharge status: Skilled Nursing Facility | Attending: Osteopathic Medicine | Admitting: Osteopathic Medicine

## 2024-07-24 ENCOUNTER — Emergency Department

## 2024-07-24 ENCOUNTER — Observation Stay

## 2024-07-24 DIAGNOSIS — L97519 Non-pressure chronic ulcer of other part of right foot with unspecified severity: Secondary | ICD-10-CM

## 2024-07-24 DIAGNOSIS — E1151 Type 2 diabetes mellitus with diabetic peripheral angiopathy without gangrene: Secondary | ICD-10-CM | POA: Diagnosis present

## 2024-07-24 DIAGNOSIS — C7951 Secondary malignant neoplasm of bone: Secondary | ICD-10-CM | POA: Diagnosis present

## 2024-07-24 DIAGNOSIS — I959 Hypotension, unspecified: Secondary | ICD-10-CM | POA: Diagnosis present

## 2024-07-24 DIAGNOSIS — D708 Other neutropenia: Secondary | ICD-10-CM

## 2024-07-24 DIAGNOSIS — I83019 Varicose veins of right lower extremity with ulcer of unspecified site: Secondary | ICD-10-CM | POA: Diagnosis present

## 2024-07-24 DIAGNOSIS — I9589 Other hypotension: Secondary | ICD-10-CM

## 2024-07-24 DIAGNOSIS — E11628 Type 2 diabetes mellitus with other skin complications: Secondary | ICD-10-CM | POA: Diagnosis not present

## 2024-07-24 DIAGNOSIS — L089 Local infection of the skin and subcutaneous tissue, unspecified: Secondary | ICD-10-CM | POA: Diagnosis present

## 2024-07-24 DIAGNOSIS — Z1152 Encounter for screening for COVID-19: Secondary | ICD-10-CM

## 2024-07-24 DIAGNOSIS — D638 Anemia in other chronic diseases classified elsewhere: Secondary | ICD-10-CM | POA: Diagnosis present

## 2024-07-24 DIAGNOSIS — E1165 Type 2 diabetes mellitus with hyperglycemia: Secondary | ICD-10-CM | POA: Diagnosis present

## 2024-07-24 DIAGNOSIS — Z59868 Other specified financial insecurity: Secondary | ICD-10-CM

## 2024-07-24 DIAGNOSIS — L89302 Pressure ulcer of unspecified buttock, stage 2: Secondary | ICD-10-CM

## 2024-07-24 DIAGNOSIS — Z7982 Long term (current) use of aspirin: Secondary | ICD-10-CM

## 2024-07-24 DIAGNOSIS — D709 Neutropenia, unspecified: Principal | ICD-10-CM | POA: Diagnosis present

## 2024-07-24 DIAGNOSIS — Z87891 Personal history of nicotine dependence: Secondary | ICD-10-CM

## 2024-07-24 DIAGNOSIS — E11621 Type 2 diabetes mellitus with foot ulcer: Secondary | ICD-10-CM | POA: Diagnosis present

## 2024-07-24 DIAGNOSIS — E538 Deficiency of other specified B group vitamins: Secondary | ICD-10-CM | POA: Diagnosis present

## 2024-07-24 DIAGNOSIS — E1122 Type 2 diabetes mellitus with diabetic chronic kidney disease: Secondary | ICD-10-CM | POA: Diagnosis present

## 2024-07-24 DIAGNOSIS — Z9842 Cataract extraction status, left eye: Secondary | ICD-10-CM

## 2024-07-24 DIAGNOSIS — N1831 Chronic kidney disease, stage 3a: Secondary | ICD-10-CM | POA: Diagnosis present

## 2024-07-24 DIAGNOSIS — L89312 Pressure ulcer of right buttock, stage 2: Secondary | ICD-10-CM | POA: Diagnosis present

## 2024-07-24 DIAGNOSIS — Z7401 Bed confinement status: Secondary | ICD-10-CM

## 2024-07-24 DIAGNOSIS — Z794 Long term (current) use of insulin: Secondary | ICD-10-CM

## 2024-07-24 DIAGNOSIS — E66813 Obesity, class 3: Secondary | ICD-10-CM | POA: Diagnosis present

## 2024-07-24 DIAGNOSIS — H547 Unspecified visual loss: Secondary | ICD-10-CM | POA: Diagnosis present

## 2024-07-24 DIAGNOSIS — Z8249 Family history of ischemic heart disease and other diseases of the circulatory system: Secondary | ICD-10-CM

## 2024-07-24 DIAGNOSIS — I890 Lymphedema, not elsewhere classified: Secondary | ICD-10-CM | POA: Diagnosis present

## 2024-07-24 DIAGNOSIS — L89322 Pressure ulcer of left buttock, stage 2: Secondary | ICD-10-CM | POA: Diagnosis present

## 2024-07-24 DIAGNOSIS — R5383 Other fatigue: Secondary | ICD-10-CM | POA: Diagnosis present

## 2024-07-24 DIAGNOSIS — Z79899 Other long term (current) drug therapy: Secondary | ICD-10-CM

## 2024-07-24 DIAGNOSIS — T380X5A Adverse effect of glucocorticoids and synthetic analogues, initial encounter: Secondary | ICD-10-CM | POA: Diagnosis present

## 2024-07-24 DIAGNOSIS — Z9841 Cataract extraction status, right eye: Secondary | ICD-10-CM

## 2024-07-24 DIAGNOSIS — Z86718 Personal history of other venous thrombosis and embolism: Secondary | ICD-10-CM

## 2024-07-24 DIAGNOSIS — L89623 Pressure ulcer of left heel, stage 3: Secondary | ICD-10-CM | POA: Diagnosis present

## 2024-07-24 DIAGNOSIS — R197 Diarrhea, unspecified: Secondary | ICD-10-CM | POA: Diagnosis present

## 2024-07-24 DIAGNOSIS — R5081 Fever presenting with conditions classified elsewhere: Secondary | ICD-10-CM | POA: Diagnosis present

## 2024-07-24 DIAGNOSIS — Z888 Allergy status to other drugs, medicaments and biological substances status: Secondary | ICD-10-CM

## 2024-07-24 DIAGNOSIS — Z6841 Body Mass Index (BMI) 40.0 and over, adult: Secondary | ICD-10-CM

## 2024-07-24 DIAGNOSIS — L97512 Non-pressure chronic ulcer of other part of right foot with fat layer exposed: Secondary | ICD-10-CM | POA: Diagnosis present

## 2024-07-24 DIAGNOSIS — E1140 Type 2 diabetes mellitus with diabetic neuropathy, unspecified: Secondary | ICD-10-CM | POA: Diagnosis present

## 2024-07-24 DIAGNOSIS — L8990 Pressure ulcer of unspecified site, unspecified stage: Secondary | ICD-10-CM | POA: Diagnosis present

## 2024-07-24 DIAGNOSIS — Z882 Allergy status to sulfonamides status: Secondary | ICD-10-CM

## 2024-07-24 DIAGNOSIS — E162 Hypoglycemia, unspecified: Principal | ICD-10-CM | POA: Diagnosis present

## 2024-07-24 DIAGNOSIS — Z923 Personal history of irradiation: Secondary | ICD-10-CM

## 2024-07-24 DIAGNOSIS — E11649 Type 2 diabetes mellitus with hypoglycemia without coma: Secondary | ICD-10-CM | POA: Diagnosis present

## 2024-07-24 DIAGNOSIS — R059 Cough, unspecified: Secondary | ICD-10-CM | POA: Diagnosis present

## 2024-07-24 DIAGNOSIS — D72819 Decreased white blood cell count, unspecified: Secondary | ICD-10-CM

## 2024-07-24 DIAGNOSIS — Z833 Family history of diabetes mellitus: Secondary | ICD-10-CM

## 2024-07-24 DIAGNOSIS — Z961 Presence of intraocular lens: Secondary | ICD-10-CM | POA: Diagnosis present

## 2024-07-24 DIAGNOSIS — R5381 Other malaise: Secondary | ICD-10-CM | POA: Diagnosis present

## 2024-07-24 DIAGNOSIS — L89620 Pressure ulcer of left heel, unstageable: Secondary | ICD-10-CM | POA: Diagnosis not present

## 2024-07-24 DIAGNOSIS — L97529 Non-pressure chronic ulcer of other part of left foot with unspecified severity: Secondary | ICD-10-CM | POA: Diagnosis present

## 2024-07-24 DIAGNOSIS — I129 Hypertensive chronic kidney disease with stage 1 through stage 4 chronic kidney disease, or unspecified chronic kidney disease: Secondary | ICD-10-CM | POA: Diagnosis present

## 2024-07-24 DIAGNOSIS — T50901A Poisoning by unspecified drugs, medicaments and biological substances, accidental (unintentional), initial encounter: Secondary | ICD-10-CM

## 2024-07-24 DIAGNOSIS — Z7984 Long term (current) use of oral hypoglycemic drugs: Secondary | ICD-10-CM

## 2024-07-24 DIAGNOSIS — E785 Hyperlipidemia, unspecified: Secondary | ICD-10-CM | POA: Diagnosis present

## 2024-07-24 DIAGNOSIS — C61 Malignant neoplasm of prostate: Secondary | ICD-10-CM | POA: Diagnosis present

## 2024-07-24 LAB — CBC WITH DIFFERENTIAL/PLATELET
Abs Immature Granulocytes: 0 K/uL (ref 0.00–0.07)
Basophils Absolute: 0 K/uL (ref 0.0–0.1)
Basophils Relative: 0 %
Eosinophils Absolute: 0.3 K/uL (ref 0.0–0.5)
Eosinophils Relative: 13 %
HCT: 27 % — ABNORMAL LOW (ref 39.0–52.0)
Hemoglobin: 8.8 g/dL — ABNORMAL LOW (ref 13.0–17.0)
Immature Granulocytes: 0 %
Lymphocytes Relative: 11 %
Lymphs Abs: 0.3 K/uL — ABNORMAL LOW (ref 0.7–4.0)
MCH: 28.6 pg (ref 26.0–34.0)
MCHC: 32.6 g/dL (ref 30.0–36.0)
MCV: 87.7 fL (ref 80.0–100.0)
Monocytes Absolute: 0.4 K/uL (ref 0.1–1.0)
Monocytes Relative: 17 %
Neutro Abs: 1.4 K/uL — ABNORMAL LOW (ref 1.7–7.7)
Neutrophils Relative %: 59 %
Platelets: 264 K/uL (ref 150–400)
RBC: 3.08 MIL/uL — ABNORMAL LOW (ref 4.22–5.81)
RDW: 17.8 % — ABNORMAL HIGH (ref 11.5–15.5)
WBC: 2.3 K/uL — ABNORMAL LOW (ref 4.0–10.5)
nRBC: 0 % (ref 0.0–0.2)

## 2024-07-24 LAB — COMPREHENSIVE METABOLIC PANEL WITH GFR
ALT: 12 U/L (ref 0–44)
AST: 37 U/L (ref 15–41)
Albumin: 3.3 g/dL — ABNORMAL LOW (ref 3.5–5.0)
Alkaline Phosphatase: 133 U/L — ABNORMAL HIGH (ref 38–126)
Anion gap: 11 (ref 5–15)
BUN: 20 mg/dL (ref 8–23)
CO2: 19 mmol/L — ABNORMAL LOW (ref 22–32)
Calcium: 8.4 mg/dL — ABNORMAL LOW (ref 8.9–10.3)
Chloride: 109 mmol/L (ref 98–111)
Creatinine, Ser: 0.91 mg/dL (ref 0.61–1.24)
GFR, Estimated: >60 mL/min (ref >60)
Glucose, Bld: 71 mg/dL (ref 70–99)
Potassium: 4.5 mmol/L (ref 3.5–5.1)
Sodium: 140 mmol/L (ref 135–145)
Total Bilirubin: 0.2 mg/dL (ref 0.0–1.2)
Total Protein: 6.5 g/dL (ref 6.5–8.1)

## 2024-07-24 LAB — URINALYSIS, COMPLETE (UACMP) WITH MICROSCOPIC
Bacteria, UA: NONE SEEN
Bilirubin Urine: NEGATIVE
Glucose, UA: NEGATIVE mg/dL
Hgb urine dipstick: NEGATIVE
Ketones, ur: NEGATIVE mg/dL
Leukocytes,Ua: NEGATIVE
Nitrite: NEGATIVE
Protein, ur: NEGATIVE mg/dL
Specific Gravity, Urine: 1.023 (ref 1.005–1.030)
pH: 5 (ref 5.0–8.0)

## 2024-07-24 LAB — CBG MONITORING, ED
Glucose-Capillary: 61 mg/dL — ABNORMAL LOW (ref 70–99)
Glucose-Capillary: 55 mg/dL — ABNORMAL LOW (ref 70–99)
Glucose-Capillary: 64 mg/dL — ABNORMAL LOW (ref 70–99)
Glucose-Capillary: 81 mg/dL (ref 70–99)
Glucose-Capillary: 70 mg/dL (ref 70–99)

## 2024-07-24 LAB — LACTIC ACID, PLASMA: Lactic Acid, Venous: 1.6 mmol/L (ref 0.5–1.9)

## 2024-07-24 LAB — PROCALCITONIN: Procalcitonin: <0.1 ng/mL

## 2024-07-24 LAB — LIPASE, BLOOD: Lipase: 14 U/L (ref 11–51)

## 2024-07-24 MED ORDER — ENOXAPARIN SODIUM 80 MG/0.8ML IJ SOSY
65.0000 mg | PREFILLED_SYRINGE | INTRAMUSCULAR | Status: DC
  Administered 2024-07-24 – 2024-08-11 (×19): 65 mg via SUBCUTANEOUS
  Filled 2024-07-24 (×21): qty 0.65

## 2024-07-24 MED ORDER — APALUTAMIDE 60 MG PO TABS: 240.0000 mg | ORAL_TABLET | Freq: Every day | ORAL | Status: DC

## 2024-07-24 MED ORDER — DEXTROSE 5 % IV SOLN: INTRAVENOUS | Status: DC

## 2024-07-24 MED ORDER — KCL IN DEXTROSE-NACL 20-5-0.45 MEQ/L-%-% IV SOLN: Freq: Once | INTRAVENOUS | Status: DC

## 2024-07-24 MED ORDER — KCL IN DEXTROSE-NACL 20-5-0.45 MEQ/L-%-% IV SOLN
Freq: Once | INTRAVENOUS | Status: AC
  Filled 2024-07-24: qty 1000

## 2024-07-24 MED ORDER — VANCOMYCIN HCL 1500 MG/300ML IV SOLN
1500.0000 mg | Freq: Once | INTRAVENOUS | Status: AC
  Administered 2024-07-24: 1500 mg via INTRAVENOUS
  Filled 2024-07-24: qty 300

## 2024-07-24 MED ORDER — VANCOMYCIN HCL IN DEXTROSE 1-5 GM/200ML-% IV SOLN
1000.0000 mg | Freq: Two times a day (BID) | INTRAVENOUS | Status: DC
  Administered 2024-07-25 (×2): 1000 mg via INTRAVENOUS
  Filled 2024-07-24 (×3): qty 200

## 2024-07-24 MED ORDER — GADOBUTROL 1 MMOL/ML IV SOLN
10.0000 mL | Freq: Once | INTRAVENOUS | Status: AC | PRN
  Administered 2024-07-24: 10 mL via INTRAVENOUS

## 2024-07-24 MED ORDER — VANCOMYCIN HCL IN DEXTROSE 1-5 GM/200ML-% IV SOLN
1000.0000 mg | Freq: Once | INTRAVENOUS | Status: AC
  Administered 2024-07-24: 1000 mg via INTRAVENOUS
  Filled 2024-07-24: qty 200

## 2024-07-24 MED ORDER — ONDANSETRON HCL 4 MG/2ML IJ SOLN: 4.0000 mg | Freq: Four times a day (QID) | INTRAMUSCULAR | Status: DC | PRN

## 2024-07-24 MED ORDER — ATORVASTATIN CALCIUM 20 MG PO TABS
10.0000 mg | ORAL_TABLET | Freq: Every evening | ORAL | Status: DC
  Administered 2024-07-24 – 2024-08-02 (×10): 10 mg via ORAL
  Filled 2024-07-24 (×10): qty 1

## 2024-07-24 MED ORDER — SODIUM CHLORIDE 0.9 % IV SOLN
2.0000 g | INTRAVENOUS | Status: DC
  Administered 2024-07-24 – 2024-07-25 (×2): 2 g via INTRAVENOUS
  Filled 2024-07-24 (×2): qty 20

## 2024-07-24 MED ORDER — DEXTROSE-SODIUM CHLORIDE 5-0.9 % IV SOLN: INTRAVENOUS | Status: DC

## 2024-07-24 MED ORDER — ACETAMINOPHEN 650 MG RE SUPP: 650.0000 mg | Freq: Four times a day (QID) | RECTAL | Status: DC | PRN

## 2024-07-24 MED ORDER — ASPIRIN 81 MG PO TBEC
81.0000 mg | DELAYED_RELEASE_TABLET | Freq: Every day | ORAL | Status: DC
  Administered 2024-07-25 – 2024-08-12 (×19): 81 mg via ORAL
  Filled 2024-07-24 (×19): qty 1

## 2024-07-24 MED ORDER — ACETAMINOPHEN 325 MG PO TABS
650.0000 mg | ORAL_TABLET | Freq: Four times a day (QID) | ORAL | Status: DC | PRN
  Administered 2024-07-26 – 2024-08-11 (×12): 650 mg via ORAL
  Filled 2024-07-24 (×12): qty 2

## 2024-07-24 MED ORDER — LORAZEPAM 1 MG PO TABS
1.0000 mg | ORAL_TABLET | Freq: Three times a day (TID) | ORAL | Status: DC
  Administered 2024-07-25 – 2024-08-12 (×51): 1 mg via ORAL
  Filled 2024-07-24 (×54): qty 1

## 2024-07-24 MED ORDER — ONDANSETRON HCL 4 MG PO TABS: 4.0000 mg | ORAL_TABLET | Freq: Four times a day (QID) | ORAL | Status: DC | PRN

## 2024-07-24 NOTE — ED Notes (Signed)
 MD gave approval for pt to be disconnected from dextrose  IV drip to go to MRI. BG was 70 and graham crackers were given as instructed by MD.

## 2024-07-24 NOTE — Assessment & Plan Note (Signed)
Hemoglobin 8.3

## 2024-07-24 NOTE — Assessment & Plan Note (Signed)
 -  Continue Lipitor

## 2024-07-24 NOTE — H&P (Signed)
 History and Physical    Patient: [name] FMW:[id number] DOB: 11-22-1950 DOA: 07/24/2024 DOS: the patient was seen and examined on 07/24/2024 PCP: Adina Buel HERO, MD  Patient coming from: Home  Chief Complaint:  Chief Complaint  Patient presents with   Hypoglycemia   HPI: [name] is a 73 y.o. male with medical history significant of metastatic prostate cancer, type 2 diabetes mellitus, arthritis, hypertension and hyperlipidemia.  He presents to the hospital with low sugar.  He states his sister found him on the floor.  His sugar was 36.  Feels a little weak.  He states that he has been taking around 20 units of Lantus  and supposed to take 10.  He has also been taking 12 units of Humalog and was supposed to take 4.  He states he cannot see very well.  He draws up his own insulin  and injects his own insulin .  When he has a low sugar he feels out of it.  In the ER his sugar was in the 50s and 60s.  Hospitalist services contacted for further evaluation.  Patient also states he has an ulcer of his right foot that has been draining a little bit.  No fever chills or sweats.  He finished up radiation treatment for metastatic prostate cancer.  Follows with Dr. Ashley for his foot. Review of Systems: Review of Systems  Constitutional:  Positive for malaise/fatigue. Negative for chills, fever and weight loss.  HENT:  Negative for hearing loss.   Eyes:  Negative for blurred vision.  Respiratory:  Positive for shortness of breath. Negative for cough.   Cardiovascular:  Negative for chest pain.  Gastrointestinal:  Positive for diarrhea. Negative for abdominal pain, nausea and vomiting.  Genitourinary:  Negative for dysuria.  Musculoskeletal:  Positive for myalgias.  Skin:  Positive for rash.  Neurological:  Negative for dizziness.  Endo/Heme/Allergies:  Does not bruise/bleed easily.  Psychiatric/Behavioral:  Negative for depression.     Past Medical History:  Diagnosis Date    Arthritis    Bladder incontinence    Chronic kidney disease    Stage 3 Kidney disease   Diabetes mellitus without complication (HCC)    DVT of leg (deep venous thrombosis) (HCC)    H/O RIGHT LEG 2018   Dyspnea    occassional   Hyperlipidemia    Hypertension    Peripheral vascular disease    Sepsis (HCC)    Past Surgical History:  Procedure Laterality Date   ABDOMINAL AORTOGRAM W/LOWER EXTREMITY N/A 10/10/2016   Procedure: Abdominal Aortogram w/Lower Extremity;  Surgeon: Cordella KANDICE Shawl, MD;  Location: [facility];  Service: Cardiovascular;  Laterality: N/A;   CATARACT EXTRACTION W/PHACO Left 05/27/2018   Procedure: CATARACT EXTRACTION PHACO AND INTRAOCULAR LENS PLACEMENT (IOC);  Surgeon: Myrna Adine Anes, MD;  Location: [facility];  Service: Ophthalmology;  Laterality: Left;  US  03:06.2 AP% 16.1 CDE 30.16 FLUID PACK LOT @ [id number] H   CATARACT EXTRACTION W/PHACO Right 07/13/2023   Procedure: CATARACT EXTRACTION PHACO AND INTRAOCULAR LENS PLACEMENT (IOC) RIGHT DIABETIC 4.00 00:36.6;  Surgeon: Myrna Adine Anes, MD;  Location: [facility];  Service: Ophthalmology;  Laterality: Right;   CIRCUMCISION  1998   COLONOSCOPY WITH PROPOFOL  N/A 08/20/2023   Procedure: COLONOSCOPY WITH PROPOFOL ;  Surgeon: Unk Corinn Skiff, MD;  Location: [facility];  Service: Gastroenterology;  Laterality: N/A;   EYE SURGERY Left    Retina Detachment   GRAFT APPLICATION Right 05/29/2017   Procedure: GRAFT APPLICATION ( RIGHT  FOOT );  Surgeon: Jama Cordella MATSU, MD;  Location: [facility];  Service: Vascular;  Laterality: Right;  graft taken from patients right thigh   IRRIGATION AND DEBRIDEMENT FOOT Right 10/03/2016   Procedure: IRRIGATION AND DEBRIDEMENT FOOT;  Surgeon: Eva Gay, DPM;  Location: [facility];  Service: Podiatry;  Laterality: Right;   LOWER EXTREMITY ANGIOGRAPHY Right 02/24/2023   Procedure: Lower Extremity Angiography;  Surgeon: Jama Cordella MATSU, MD;  Location: [facility];  Service: Cardiovascular;  Laterality: Right;   LOWER EXTREMITY INTERVENTION  10/10/2016   Procedure: Lower Extremity Intervention;  Surgeon: Cordella MATSU Jama, MD;  Location: [facility];  Service: Cardiovascular;;   PERIPHERAL VASCULAR BALLOON ANGIOPLASTY Left 10/10/2016   Procedure: Peripheral Vascular Balloon Angioplasty;  Surgeon: Cordella MATSU Jama, MD;  Location: [facility];  Service: Cardiovascular;  Laterality: Left;   TONSILLECTOMY     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/2022   Procedure: TRANSMETATARSAL AMPUTATION;  Surgeon: Gay Eva, DPM;  Location: [facility];  Service: Podiatry;  Laterality: Right;   WOUND DEBRIDEMENT Right 11/07/2016   Procedure: DEBRIDEMENT OF WOUND AND BONE RIGHT FOOT AND APPLY WOUND VAC;  Surgeon: Eva Gay, DPM;  Location: [facility];  Service: Podiatry;  Laterality: Right;   Social History:  reports that he quit smoking about 49 years ago. His smoking use included pipe and cigarettes. He has never used smokeless tobacco. He reports that he does not drink alcohol and does not use drugs.  Allergies  Allergen Reactions   Sulfa Antibiotics Rash   Piperacillin -Tazobactam In Dex     Family History  Problem Relation Age of Onset   Diabetes Mother    Hypertension Mother    Diabetes Father    Hypertension Father    Cancer Father     Prior to Admission medications   Medication Sig Start Date End Date Taking? Authorizing Provider  apalutamide  (ERLEADA ) 60 MG tablet Take 4 tablets (240 mg total) by mouth daily. 07/06/24  Yes Brahmanday, Govinda R, MD  aspirin  EC 81 MG tablet Take 81 mg by mouth daily. Swallow whole.   Yes [provider]  atorvastatin  (LIPITOR) 10 MG tablet Take 10 mg by mouth every evening. 06/12/20  Yes [provider]  insulin  glargine (LANTUS ) 100 UNIT/ML injection Inject 0.15 mLs (15 Units total) into the skin at bedtime. 10/13/16  Yes Sherial Bail, MD  insulin  lispro (HUMALOG)  100 UNIT/ML injection Inject 4 Units into the skin See admin instructions. Inject 4 units SQ with breakfast, inject 4 units SQ with lunch and inject 4 units SQ with dinner   Yes [provider]  lisinopril  (ZESTRIL ) 10 MG tablet Take 10 mg by mouth daily. 06/16/24  Yes [provider]  LORazepam  (ATIVAN ) 1 MG tablet Take 1 mg by mouth every 8 (eight) hours.   Yes [provider]  metFORMIN  (GLUCOPHAGE ) 1000 MG tablet Take 1,000 mg by mouth 2 (two) times daily with a meal. 06/12/20  Yes [provider]  ACCU-CHEK GUIDE test strip Use 1 strip via meter four times a day  to monitor blood glucose 07/10/20   [provider]  Accu-Chek Softclix Lancets lancets  04/29/20   [provider]  DROPLET INSULIN  SYRINGE 31G X 5/16 0.3 ML MISC USE FIVE TIMES DAILY AS DIRECTED 06/12/20   [provider]  glucose blood (ACCU-CHEK AVIVA PLUS) test strip  04/29/20   [provider]  Insulin  Syringe-Needle U-100 31G X 5/16 0.3 ML MISC  06/05/20  [provider]  Wound Cleansers (VASHE WOUND THERAPY EX) Apply 1 Application topically daily after breakfast.    [provider]    Physical Exam: Vitals:   07/24/24 1549 07/24/24 1550  BP: (!) 106/59   Pulse: 98   Resp: 18   Temp: 98 F (36.7 C)   TempSrc: Oral   SpO2: 100%   Weight:  131.1 kg  Height:  5' 11 (1.803 m)   Physical Exam HENT:     Head: Normocephalic.  Eyes:     General: Lids are normal.  Cardiovascular:     Rate and Rhythm: Normal rate and regular rhythm.     Heart sounds: Normal heart sounds, S1 normal and S2 normal.  Pulmonary:     Breath sounds: No decreased breath sounds, wheezing, rhonchi or rales.  Abdominal:     Palpations: Abdomen is soft.     Tenderness: There is no abdominal tenderness.  Musculoskeletal:     Right lower leg: Swelling present.     Left lower leg: Swelling present.  Skin:    General: Skin is warm.     Comments: Chronic  lower extremity skin discoloration.  Neurological:     Mental Status: He is alert and oriented to person, place, and time.        Data Reviewed: Low sugar on fingerstick 55 Creatinine 0.91, CO2 19, alkaline phosphatase 133, albumin 3.3, GFR greater than 60, lactic acid 1.6, white blood cell count 2.3, hemoglobin 8.8, platelet count 264 CT scan of the head showed no acute intracranial process.  CT scan of the cervical spine showed no acute cervical spine fracture, multifocal bone metastases consistent with metastatic prostate cancer Right foot x-ray shows no acute fracture or dislocation status post transmetatarsal amputation.  Left foot x-ray shows osteopenia no acute fracture or dislocation.  Chest x-ray shows no acute cardiopulmonary process identified.  Assessment and Plan: * Hypoglycemia Likely secondary to taking too much insulin  and poor vision on drawing up his insulin .  Will be placed on D5 drip.  Fingersticks every 4.  Hopefully when sugars above 200 can discontinue the drip and put on sliding scale.  Diabetic foot infection (HCC) Patient follows with Dr. Ashley for chronic right foot ulcerations.  Consulted podiatry.  Patient states they are draining.  Will get a wound culture.  Empiric antibiotic.  X-ray unremarkable.  Check a sedimentation rate.  Will get an MRI to rule out deeper seeded infection.  Decubitus ulcer of left heel, unstageable (HCC) Will get heel protector.  Hypotension Gentle fluids and hold lisinopril   Prostate cancer metastatic to multiple sites [facility]) Patient states he has finished up radiation therapy.  Patient on apalutamide .  Hyperlipidemia, unspecified Continue Lipitor  Anemia of chronic disease Hemoglobin 8.8  Obesity, Class III, BMI 40-49.9 (morbid obesity) (HCC) BMI 40.31 with current height and weight in computer.      Advance Care Planning:   Code Status: Full Code   Consults: Podiatry    Severity of Illness: The appropriate  patient status for this patient is OBSERVATION. Observation status is judged to be reasonable and necessary in order to provide the required intensity of service to ensure the patient's safety. The patient's presenting symptoms, physical exam findings, and initial radiographic and laboratory data in the context of their medical condition is felt to place them at decreased risk for further clinical deterioration. Furthermore, it is anticipated that the patient will be medically stable for discharge from the hospital within 2 midnights of admission.  Author: Charlie Patterson, MD 07/24/2024 6:07 PM  For on call review [email or web address].

## 2024-07-24 NOTE — Assessment & Plan Note (Addendum)
 BMI 40.31 with only height and weight in computer.

## 2024-07-24 NOTE — Assessment & Plan Note (Addendum)
 Patient states he has finished up radiation therapy.  Patient on apalutamide  (holding here).  Patient's PSA down to 63.51

## 2024-07-24 NOTE — ED Provider Notes (Signed)
 [facility] Provider Note    Event Date/Time   First MD Initiated Contact with Patient 07/24/24 1532     (approximate)   History   Hypoglycemia   HPI  [name] is a 73 y.o. male with PVD with chronic right foot ulcer, CKD stage IIIa, diabetes type II on insulin , anemia, and stage IV prostate cancer metastatic to bone who presents to the emergency department with an episode of collapse found to be hypoglycemic.  Patient states that since ending his radiation treatment for prostate cancer last week he has had a decreased appetite.  He tells me that he gave himself 20 units of Lantus  last night and 12 units of Humalog today after a meal.  He did not check his glucose before this.  He was in a wheelchair and family found him on the floor.  On arrival with EMS, his glucose was in the 30s and he did receive D10 with return to appropriate mentation.  Patient denies any headache neck pain chest pain abdominal pain or pain in his legs extremities, but does tell me that he feels as if he is more confused than usual.  He does note that although he has ulcerations on his right foot which was last changed by home nurse 2 days ago, and new ulceration has appeared on the heel of his left foot.  There have been no reports of any fevers at home.  Patient states that he self administers his medication  On chart review, patient is supposed to be using only 4 units of Humalog with meals and 15 units of Lantus  at nighttime.  After discussing that with the patient he tells me oh yes, I must of gotten it wrong.      Physical Exam   Triage Vital Signs: ED Triage Vitals  Encounter Vitals Group     BP      Girls Systolic BP Percentile      Girls Diastolic BP Percentile      Boys Systolic BP Percentile      Boys Diastolic BP Percentile      Pulse      Resp      Temp      Temp src      SpO2      Weight      Height      Head Circumference      Peak Flow      Pain  Score      Pain Loc      Pain Education      Exclude from Growth Chart     Most recent vital signs: Vitals:   07/24/24 2000 07/24/24 2010  BP:  101/65  Pulse: 99 100  Resp: (!) 22 (!) 22  Temp: 98.2 F (36.8 C)   SpO2: 100% 100%    Nursing Triage Note reviewed. Vital signs reviewed and patients oxygen saturation is normoxic  General: Patient is well nourished, well developed, awake and alert, resting comfortably in no acute distress  Head: Normocephalic, no scalp lesions or abrasions Eyes: Normal inspection, extraocular muscles intact, no conjunctival pallor Ear, nose, throat: Normal external exam Neck: Normal range of motion no C-spine tenderness to palpation Respiratory: Patient is in no respiratory distress, lungs CTAB Cardiovascular: Patient is tachycardic, RR without murmur appreciated GI: Abd SNT with no guarding or rebound  Back: Normal inspection of the back with good strength and range of motion throughout all ext Extremities: pulses intact with good  cap refills, no LE pitting edema or calf tenderness Right foot:  Left foot:    Neuro: The patient is alert and oriented to person, place, and time, mostly answers questions correctly but on occasion has some confusion with 5/5 bilat UE/LE strength, no gross motor or sensory defects noted. Coordination appears to be adequate.  Psych: normal mood and affect, no SI or HI  ED Results / Procedures / Treatments   Labs (all labs ordered are listed, but only abnormal results are displayed) Labs Reviewed  CBC WITH DIFFERENTIAL/PLATELET - Abnormal; Notable for the following components:      Result Value   WBC 2.3 (*)    RBC 3.08 (*)    Hemoglobin 8.8 (*)    HCT 27.0 (*)    RDW 17.8 (*)    Neutro Abs 1.4 (*)    Lymphs Abs 0.3 (*)    All other components within normal limits  COMPREHENSIVE METABOLIC PANEL WITH GFR - Abnormal; Notable for the following components:   CO2 19 (*)    Calcium  8.4 (*)    Albumin 3.3 (*)     Alkaline Phosphatase 133 (*)    All other components within normal limits  CBG MONITORING, ED - Abnormal; Notable for the following components:   Glucose-Capillary 61 (*)    All other components within normal limits  CBG MONITORING, ED - Abnormal; Notable for the following components:   Glucose-Capillary 55 (*)    All other components within normal limits  CBG MONITORING, ED - Abnormal; Notable for the following components:   Glucose-Capillary 64 (*)    All other components within normal limits  AEROBIC/ANAEROBIC CULTURE W GRAM STAIN (SURGICAL/DEEP WOUND)  LIPASE, BLOOD  PROCALCITONIN  LACTIC ACID, PLASMA  URINALYSIS, COMPLETE (UACMP) WITH MICROSCOPIC  BASIC METABOLIC PANEL WITH GFR  CBC  SEDIMENTATION RATE  CBG MONITORING, ED  CBG MONITORING, ED  CBG MONITORING, ED  CBG MONITORING, ED  CBG MONITORING, ED  CBG MONITORING, ED  CBG MONITORING, ED  CBG MONITORING, ED  CBG MONITORING, ED  CBG MONITORING, ED  CBG MONITORING, ED  CBG MONITORING, ED  CBG MONITORING, ED  CBG MONITORING, ED  CBG MONITORING, ED     EKG EKG and rhythm strip are interpreted by myself:   EKG: Tachycardic sinus rhythm] at heart rate of 96, normal QRS duration, QTc 495, nonspecific ST segments and T waves no ectopy EKG not consistent with Acute STEMI Rhythm strip: Tachycardic sinus rhythm in lead II   RADIOLOGY CT head CT C-spine Chest x-ray Right foot x-ray Left foot x-ray    PROCEDURES:  Critical Care performed: Yes, see critical care procedure note(s)  .Critical Care  Performed by: Nicholaus Rolland BRAVO, MD Authorized by: Nicholaus Rolland BRAVO, MD   Critical care provider statement:    Critical care time (minutes):  35   Critical care was necessary to treat or prevent imminent or life-threatening deterioration of the following conditions:  Endocrine crisis   Critical care was time spent personally by me on the following activities:  Development of treatment plan with patient or surrogate,  discussions with consultants, evaluation of patient's response to treatment, examination of patient, ordering and review of laboratory studies, ordering and review of radiographic studies, ordering and performing treatments and interventions, pulse oximetry, re-evaluation of patient's condition and review of old charts   I assumed direction of critical care for this patient from another provider in my specialty: yes     Care discussed with: admitting provider  Comments:     Recurrent hypoglycemia requiring frequent checks and initiation of interventions including intravenous dextrose     MEDICATIONS ORDERED IN ED: Medications  dextrose  5 %-0.9 % sodium chloride  infusion ( Intravenous New Bag/Given 07/24/24 2007)  enoxaparin  (LOVENOX ) injection 65 mg (has no administration in time range)  acetaminophen  (TYLENOL ) tablet 650 mg (has no administration in time range)    Or  acetaminophen  (TYLENOL ) suppository 650 mg (has no administration in time range)  ondansetron  (ZOFRAN ) tablet 4 mg (has no administration in time range)    Or  ondansetron  (ZOFRAN ) injection 4 mg (has no administration in time range)  aspirin  EC tablet 81 mg (has no administration in time range)  apalutamide  (ERLEADA ) tablet 240 mg (has no administration in time range)  atorvastatin  (LIPITOR) tablet 10 mg (10 mg Oral Given 07/24/24 1949)  LORazepam  (ATIVAN ) tablet 1 mg (has no administration in time range)  cefTRIAXone  (ROCEPHIN ) 2 g in sodium chloride  0.9 % 100 mL IVPB (0 g Intravenous Stopped 07/24/24 2024)  vancomycin  (VANCOREADY) IVPB 1500 mg/300 mL (1,500 mg Intravenous New Bag/Given 07/24/24 2024)    Followed by  vancomycin  (VANCOCIN ) IVPB 1000 mg/200 mL premix (has no administration in time range)  vancomycin  (VANCOCIN ) IVPB 1000 mg/200 mL premix (has no administration in time range)  dextrose  5 % and 0.45 % NaCl with KCl 20 mEq/L infusion (0 mLs Intravenous Stopped 07/24/24 2007)  gadobutrol  (GADAVIST ) 1 MMOL/ML  injection 10 mL (10 mLs Intravenous Contrast Given 07/24/24 1940)     IMPRESSION / MDM / ASSESSMENT AND PLAN / ED COURSE                                Differential diagnosis includes, but is not limited to, electrolyte derangement, medication issue, anemia, UTI, ulcerations,   ED course: Patient presents and he is hyperglycemic but is alert enough to take oral p.o. and this was initiated.  After discussing with the patient it does sound like he is taking his insulin  incorrectly.  He does have ulcerations on his lower feet and is unclear to me whether they are acutely infected.  He is leukopenic but not neutropenic.  Despite oral glucose and elevation he did drop again so I did initiate him on IV dextrose .  His lactic acid and procalcitonin was not elevated so deferred antibiotics to admitting team and case was discussed with hospitalist for admission   Clinical Course as of 07/24/24 2049  Sun Jul 24, 2024  1535 Patient's glucose is currently 18 but he is awake and can likely take p.o.  Will give him some fluid and recheck in an hour, if still low he may need glucose supplementation [HD]  1606 WBC(!): 2.3 Leukopenic [HD]  1606 NEUT#(!): 1.4 But not completely neutropenic [HD]  1606 Procalcitonin: <0.10 Not elevated [HD]  1606 Comprehensive metabolic panel(!) No profound electrolyte derangements and glucose here is 71 [HD]  1708 Glucose-Capillary(!): 55 Dropped again.  Will start maintenance fluids [HD]  1714 Case discussed with hospitalist for admission [HD]    Clinical Course User Index [HD] Nicholaus Rolland BRAVO, MD    -- Risk: 5 This patient has a high risk of morbidity due to further diagnostic testing or treatment. Rationale: This patient's evaluation and management involve a high risk of morbidity due to the potential severity of presenting symptoms, need for diagnostic testing, and/or initiation of treatment that may require close monitoring. The differential includes conditions  with  potential for significant deterioration or requiring escalation of care. Treatment decisions in the ED, including medication administration, procedural interventions, or disposition planning, reflect this level of risk. COPA: 5 The patient has the following acute or chronic illness/injury that poses a possible threat to life or bodily function: [X] : The patient has a potentially serious acute condition or an acute exacerbation of a chronic illness requiring urgent evaluation and management in the Emergency Department. The clinical presentation necessitates immediate consideration of life-threatening or function-threatening diagnoses, even if they are ultimately ruled out.   FINAL CLINICAL IMPRESSION(S) / ED DIAGNOSES   Final diagnoses:  Hypoglycemia  Accidental medication error, initial encounter  Skin ulcers of foot, bilateral (HCC)     Rx / DC Orders   ED Discharge Orders     None        Note:  This document was prepared using Dragon voice recognition software and may include unintentional dictation errors.   Nicholaus Rolland BRAVO, MD 07/24/24 2049

## 2024-07-24 NOTE — Assessment & Plan Note (Signed)
 heel protector.

## 2024-07-24 NOTE — Progress Notes (Signed)
 PHARMACIST - PHYSICIAN COMMUNICATION  CONCERNING:  Enoxaparin  (Lovenox ) for DVT Prophylaxis    RECOMMENDATION: Patient was prescribed enoxaprin 40mg  q24 hours for VTE prophylaxis.   Filed Weights   07/24/24 1550  Weight: 131.1 kg (289 lb 0.4 oz)    Body mass index is 40.31 kg/m.  Estimated Creatinine Clearance: 98.3 mL/min (by C-G formula based on SCr of 0.91 mg/dL).   Based on [facility] policy patient is candidate for enoxaparin  0.5mg /kg TBW SQ every 24 hours based on BMI being >30.   DESCRIPTION: Pharmacy has adjusted enoxaparin  dose per [facility] policy.  Patient is now receiving enoxaparin  65 mg every 24 hours    Lum VEAR Mania, PharmD Clinical Pharmacist  07/24/2024 5:45 PM

## 2024-07-24 NOTE — Consult Note (Signed)
 Pharmacy Antibiotic Note  [name] is a 73 y.o. male admitted on 07/24/2024 with diabetic foot infection.  Pharmacy has been consulted for vancomycin  dosing.  Plan: Give vancomycin  2500 mg IV x 1 loading dose Start vancomycin  1000 mg IV every 12 hours Estimated AUC 453, Cmin 13.1 IBW, Scr 0.91, Vd 0.5 (BMI 40) Vancomycin  levels at steady state or as clinically indicated Ceftriaxone  2 grams IV every 24 hours per provider Follow renal function and cultures for adjustments  Height: 5' 11 (180.3 cm) Weight: 131.1 kg (289 lb 0.4 oz) IBW/kg (Calculated) : 75.3  Temp (24hrs), Avg:98 F (36.7 C), Min:98 F (36.7 C), Max:98 F (36.7 C)  Recent Labs  Lab 07/24/24 1533 07/24/24 1536  WBC 2.3*  --   CREATININE 0.91  --   LATICACIDVEN  --  1.6    Estimated Creatinine Clearance: 98.3 mL/min (by C-G formula based on SCr of 0.91 mg/dL).    Allergies  Allergen Reactions   Sulfa Antibiotics Rash   Piperacillin -Tazobactam In Dex     Antimicrobials this admission: ceftriaxone  11/23 >>  vancomycin  11/23 >>   Microbiology results: 11/23 right foot wound culture: ordered  Thank you for allowing pharmacy to be a part of this patient's care.  Kayla JULIANNA Blew, PharmD 07/24/2024 6:08 PM

## 2024-07-24 NOTE — ED Triage Notes (Signed)
 Pt coming from home via EMS. Pt has T2DM. Pt self administered 12 units of Humalog insulin  around 10AM. States he checked his BG before administering and it was 102. Pt found by family around 3AM on the floor. EMS gave 35 g of D10 and then BG was 176. Pt does take asprin daily.

## 2024-07-24 NOTE — Assessment & Plan Note (Addendum)
 On admission.  Likely secondary to taking too much insulin  and poor vision on drawing up his insulin .  Initially placed on D5 drip.  Taken off D5 drip on 11/24.  Continue Tradjenta  and Glucophage  to maintain sugars.  Patient will not need insulin  upon discharge.

## 2024-07-24 NOTE — Assessment & Plan Note (Addendum)
 Appreciate podiatry consultation.  MRI does not show any osteomyelitis.  Received antibiotics during the hospital course.   Will need close follow-up as outpatient. Continue with wound care

## 2024-07-24 NOTE — Assessment & Plan Note (Addendum)
 Will start low-dose midodrine .  Hold blood pressure medication.

## 2024-07-25 DIAGNOSIS — D638 Anemia in other chronic diseases classified elsewhere: Secondary | ICD-10-CM | POA: Diagnosis present

## 2024-07-25 DIAGNOSIS — C7951 Secondary malignant neoplasm of bone: Secondary | ICD-10-CM | POA: Diagnosis present

## 2024-07-25 DIAGNOSIS — E11628 Type 2 diabetes mellitus with other skin complications: Secondary | ICD-10-CM | POA: Diagnosis present

## 2024-07-25 DIAGNOSIS — E1122 Type 2 diabetes mellitus with diabetic chronic kidney disease: Secondary | ICD-10-CM | POA: Diagnosis present

## 2024-07-25 DIAGNOSIS — L89312 Pressure ulcer of right buttock, stage 2: Secondary | ICD-10-CM | POA: Diagnosis present

## 2024-07-25 DIAGNOSIS — L89302 Pressure ulcer of unspecified buttock, stage 2: Secondary | ICD-10-CM | POA: Diagnosis not present

## 2024-07-25 DIAGNOSIS — E1151 Type 2 diabetes mellitus with diabetic peripheral angiopathy without gangrene: Secondary | ICD-10-CM | POA: Diagnosis present

## 2024-07-25 DIAGNOSIS — I9589 Other hypotension: Secondary | ICD-10-CM | POA: Diagnosis not present

## 2024-07-25 DIAGNOSIS — E66813 Obesity, class 3: Secondary | ICD-10-CM | POA: Diagnosis present

## 2024-07-25 DIAGNOSIS — I959 Hypotension, unspecified: Secondary | ICD-10-CM | POA: Diagnosis not present

## 2024-07-25 DIAGNOSIS — N1831 Chronic kidney disease, stage 3a: Secondary | ICD-10-CM | POA: Diagnosis present

## 2024-07-25 DIAGNOSIS — L89322 Pressure ulcer of left buttock, stage 2: Secondary | ICD-10-CM | POA: Diagnosis present

## 2024-07-25 DIAGNOSIS — D72819 Decreased white blood cell count, unspecified: Secondary | ICD-10-CM

## 2024-07-25 DIAGNOSIS — L89620 Pressure ulcer of left heel, unstageable: Secondary | ICD-10-CM | POA: Diagnosis present

## 2024-07-25 DIAGNOSIS — E1140 Type 2 diabetes mellitus with diabetic neuropathy, unspecified: Secondary | ICD-10-CM | POA: Diagnosis present

## 2024-07-25 DIAGNOSIS — Z1152 Encounter for screening for COVID-19: Secondary | ICD-10-CM | POA: Diagnosis not present

## 2024-07-25 DIAGNOSIS — L97512 Non-pressure chronic ulcer of other part of right foot with fat layer exposed: Secondary | ICD-10-CM | POA: Diagnosis present

## 2024-07-25 DIAGNOSIS — Z794 Long term (current) use of insulin: Secondary | ICD-10-CM | POA: Diagnosis not present

## 2024-07-25 DIAGNOSIS — I129 Hypertensive chronic kidney disease with stage 1 through stage 4 chronic kidney disease, or unspecified chronic kidney disease: Secondary | ICD-10-CM | POA: Diagnosis present

## 2024-07-25 DIAGNOSIS — D709 Neutropenia, unspecified: Secondary | ICD-10-CM | POA: Diagnosis present

## 2024-07-25 DIAGNOSIS — E538 Deficiency of other specified B group vitamins: Secondary | ICD-10-CM | POA: Diagnosis not present

## 2024-07-25 DIAGNOSIS — I83019 Varicose veins of right lower extremity with ulcer of unspecified site: Secondary | ICD-10-CM | POA: Diagnosis present

## 2024-07-25 DIAGNOSIS — D708 Other neutropenia: Secondary | ICD-10-CM | POA: Diagnosis not present

## 2024-07-25 DIAGNOSIS — C61 Malignant neoplasm of prostate: Secondary | ICD-10-CM | POA: Diagnosis present

## 2024-07-25 DIAGNOSIS — Z6841 Body Mass Index (BMI) 40.0 and over, adult: Secondary | ICD-10-CM | POA: Diagnosis not present

## 2024-07-25 DIAGNOSIS — E11649 Type 2 diabetes mellitus with hypoglycemia without coma: Secondary | ICD-10-CM | POA: Diagnosis present

## 2024-07-25 DIAGNOSIS — E11621 Type 2 diabetes mellitus with foot ulcer: Secondary | ICD-10-CM | POA: Diagnosis present

## 2024-07-25 DIAGNOSIS — L89623 Pressure ulcer of left heel, stage 3: Secondary | ICD-10-CM | POA: Diagnosis present

## 2024-07-25 DIAGNOSIS — E785 Hyperlipidemia, unspecified: Secondary | ICD-10-CM | POA: Diagnosis not present

## 2024-07-25 DIAGNOSIS — R5081 Fever presenting with conditions classified elsewhere: Secondary | ICD-10-CM | POA: Diagnosis not present

## 2024-07-25 DIAGNOSIS — L089 Local infection of the skin and subcutaneous tissue, unspecified: Secondary | ICD-10-CM | POA: Diagnosis not present

## 2024-07-25 DIAGNOSIS — E162 Hypoglycemia, unspecified: Secondary | ICD-10-CM | POA: Diagnosis present

## 2024-07-25 DIAGNOSIS — E1165 Type 2 diabetes mellitus with hyperglycemia: Secondary | ICD-10-CM | POA: Diagnosis present

## 2024-07-25 LAB — GLUCOSE, CAPILLARY
Glucose-Capillary: 203 mg/dL — ABNORMAL HIGH (ref 70–99)
Glucose-Capillary: 173 mg/dL — ABNORMAL HIGH (ref 70–99)
Glucose-Capillary: 185 mg/dL — ABNORMAL HIGH (ref 70–99)

## 2024-07-25 LAB — BASIC METABOLIC PANEL WITH GFR
Anion gap: 8 (ref 5–15)
BUN: 14 mg/dL (ref 8–23)
CO2: 18 mmol/L — ABNORMAL LOW (ref 22–32)
Calcium: 7.9 mg/dL — ABNORMAL LOW (ref 8.9–10.3)
Chloride: 111 mmol/L (ref 98–111)
Creatinine, Ser: 0.7 mg/dL (ref 0.61–1.24)
GFR, Estimated: >60 mL/min (ref >60)
Glucose, Bld: 149 mg/dL — ABNORMAL HIGH (ref 70–99)
Potassium: 4.4 mmol/L (ref 3.5–5.1)
Sodium: 136 mmol/L (ref 135–145)

## 2024-07-25 LAB — CBC
HCT: 26.8 % — ABNORMAL LOW (ref 39.0–52.0)
Hemoglobin: 8.7 g/dL — ABNORMAL LOW (ref 13.0–17.0)
MCH: 28.6 pg (ref 26.0–34.0)
MCHC: 32.5 g/dL (ref 30.0–36.0)
MCV: 88.2 fL (ref 80.0–100.0)
Platelets: 287 K/uL (ref 150–400)
RBC: 3.04 MIL/uL — ABNORMAL LOW (ref 4.22–5.81)
RDW: 17.5 % — ABNORMAL HIGH (ref 11.5–15.5)
WBC: 1.2 K/uL — CL (ref 4.0–10.5)
nRBC: 0 % (ref 0.0–0.2)

## 2024-07-25 LAB — CBG MONITORING, ED
Glucose-Capillary: 135 mg/dL — ABNORMAL HIGH (ref 70–99)
Glucose-Capillary: 136 mg/dL — ABNORMAL HIGH (ref 70–99)
Glucose-Capillary: 138 mg/dL — ABNORMAL HIGH (ref 70–99)
Glucose-Capillary: 146 mg/dL — ABNORMAL HIGH (ref 70–99)
Glucose-Capillary: 148 mg/dL — ABNORMAL HIGH (ref 70–99)
Glucose-Capillary: 194 mg/dL — ABNORMAL HIGH (ref 70–99)
Glucose-Capillary: 216 mg/dL — ABNORMAL HIGH (ref 70–99)

## 2024-07-25 LAB — SEDIMENTATION RATE: Sed Rate: 37 mm/h — ABNORMAL HIGH (ref 0–20)

## 2024-07-25 MED ORDER — COLLAGENASE 250 UNIT/GM EX OINT
TOPICAL_OINTMENT | Freq: Every day | CUTANEOUS | Status: DC
  Filled 2024-07-25 (×2): qty 30

## 2024-07-25 MED ORDER — INSULIN ASPART 100 UNIT/ML IJ SOLN
0.0000 [IU] | INTRAMUSCULAR | Status: DC
  Administered 2024-07-25 (×2): 2 [IU] via SUBCUTANEOUS
  Administered 2024-07-26: 1 [IU] via SUBCUTANEOUS
  Filled 2024-07-25: qty 1
  Filled 2024-07-25 (×2): qty 2

## 2024-07-25 MED ORDER — GERHARDT'S BUTT CREAM
TOPICAL_CREAM | Freq: Two times a day (BID) | CUTANEOUS | Status: DC
  Administered 2024-07-29: 1 via TOPICAL
  Administered 2024-07-30: 2 via TOPICAL
  Filled 2024-07-25 (×2): qty 60

## 2024-07-25 MED ORDER — FILGRASTIM-AAFI 300 MCG/0.5ML IJ SOSY
300.0000 ug | PREFILLED_SYRINGE | Freq: Once | INTRAMUSCULAR | Status: AC
  Administered 2024-07-25: 300 ug via SUBCUTANEOUS
  Filled 2024-07-25: qty 0.5

## 2024-07-25 NOTE — ED Notes (Signed)
 Ordered heel protectors from supply chain.

## 2024-07-25 NOTE — Inpatient Diabetes Management (Signed)
 Inpatient Diabetes Program Recommendations  AACE/ADA: New Consensus Statement on Inpatient Glycemic Control   Target Ranges:  Prepandial:   less than 140 mg/dL      Peak postprandial:   less than 180 mg/dL (1-2 hours)      Critically ill patients:  140 - 180 mg/dL    Latest Reference Range & Units 07/25/24 00:05 07/25/24 01:50 07/25/24 05:16 07/25/24 06:58 07/25/24 08:09 07/25/24 10:05 07/25/24 11:43  Glucose-Capillary 70 - 99 mg/dL 851 (H) 853 (H) 861 (H) 135 (H) 136 (H) 194 (H) 216 (H)    Latest Reference Range & Units 07/24/24 15:31 07/24/24 17:01 07/24/24 17:20 07/24/24 17:46 07/24/24 18:40  Glucose-Capillary 70 - 99 mg/dL 61 (L) 55 (L) 64 (L) 81 70   Review of Glycemic Control  Diabetes history: DM2 Outpatient Diabetes medications: Lantus  20 units daily, Humalog 12 units TID, Metformin  1000 mg BID Current orders for Inpatient glycemic control: CBGs Q4H  Inpatient Diabetes Program Recommendations:    Insulin : Initially hypoglycemic which has resolved and dextrose  in IV fluids stopped. Most current CBG 216 mg/dl at 88:56 today.  Please consider ordering Novolog  0-9 units Q4H.  Outpatient DM: Given patient's poor vision, concerned about patient self administration of insulin  outpatient. If patient needs insulin  outpatient, he will likely need to have family giving the insulin  to him.  NOTE: Spoke with patient at bedside in ED about diabetes and home regimen for diabetes control. Patient reports he is taking Lantus  and Humalog at home (vial/syringe) along with Metformin  BID for DM control. Patient states that he is not sure but he may be taking wrong doses of insulin . Patient states he has been taking Lantus  20 units daily and Humalog 12 units TID with meals. He reports that he can not see well at all. Patient has never used an insulin  pen. Showed patient the insulin  pen and patient could not see any numbers on the dial regarding dose numbers. Explained that he could listen for clicks and  each click is 1 unit.  Educated patient on insulin  pen. Reviewed all steps of insulin  pen including attachment of needle, 2-unit air shot, dialing up dose, giving injection, removing needle, disposal of sharps, storage of unused insulin , disposal of insulin  etc. Asked patient to demonstrate how to use the insulin  pen. Patient had to be told step by step verbally what to do and patient was not paying any attention to the dose on the dial or listening for clicks as instructed. Had patient demonstrate insulin  pen use x3 but still needed help with using it properly. Patient states that he finished cancer treatments recently (this past Thursday) and that he has not been eating well during cancer treatments nor since finishing them. Patient reports that he only oral DM medication he has ever taken is Metformin . Patient reports that his sister lives with him and could help him with DM management if needed. Explained that he likely needs his sister to help him with insulin  if insulin  is continued at discharge.   Patient verbalized understanding of information discussed and reports no further questions at this time related to diabetes.  Doubt patient was able to tell at all how much insulin  he was taking given his poor eyesight and not being able to see the numbers on the insulin  pen (which are much larger than numbers on an insulin  syringe).  Thanks, Earnie Gainer, RN, MSN, CDE Diabetes Coordinator Inpatient Diabetes Program [phone] (Team Pager)

## 2024-07-25 NOTE — Hospital Course (Addendum)
 Hospital course / significant events:   HPI: 73 y.o. male with medical history significant of metastatic prostate cancer, type 2 diabetes mellitus, arthritis, hypertension and hyperlipidemia.  He presents to the hospital with low sugar.  He states his sister found him on the floor.  His sugar was 36.  He states that he has been taking around 20 units of Lantus  and supposed to take 10.  He has also been taking 12 units of Humalog and was supposed to take 4.  He states he cannot see very well.  He draws up his own insulin  and injects his own insulin .  11/23: In the ER his sugar was in the 50s and 60s.  Hospitalist services contacted for further evaluation.  Patient also states he has an ulcer of his right foot that has been draining a little bit.  No fever chills or sweats.  He finished up radiation treatment for metastatic prostate cancer.  Follows with Dr. Ashley for his foot.  Patient put on D5W and IV antibiotics. 11/24.  Patient taken off D5W drip and will watch sugars another day.  Concerned about his vision and drawing up insulin .  Wondering if he can do pills instead.  MRI right foot did not show osteomyelitis.  Podiatry saw patient and recommended outpatient follow-up. 11/25.  Physical therapy recommending rehab from yesterday.  Case discussed with diabetes coordinator and will do Tradjenta  and Glucophage  and get rid of insulin .  11/25  Patient spiked a fever of 100.7.  Treating as neutropenic fever.  Blood cultures, urine culture and chest x-ray ordered.  Maxipime  ordered.  Continue doxycycline . 11/26: Maximum temperature recorded of 102.7 over the past 24-hour, worsening leukopenia at 0.9-ordered another dose of Granix .  Preliminary blood cultures negative, checking respiratory viral panel, negative COVID, influenza and RSV.  Continuing cefepime  for now. 11/27: Afebrile over the past 24-hour, WBC at 1.0 and neutrophils of 0.  Discussed with Dr. Jacobo from oncology, advised to continue Granix   until neutrophil of 1000.  Another dose of 480 mg ordered today.  11/28: Remained afebrile.  WBC still at 1 with neutrophil of 0, continuing Granix , phosphorus improved to 2.4-giving another dose. Foot wound does not look infected. 11/29: Remained afebrile, WBC still at 0.9 with neutrophils of 0, case was discussed again with Dr. Jacobo who advised to discontinue antibiotics and continue with Granix , continue holding Erleada . 11/30: Remained afebrile and hemodynamically stable, slowly worsening leukopenia now WBC at 0.8, neutrophil remains 0.  Case was discussed again with Dr. Jacobo - continue with Granix  and he will discuss with his primary oncologist Dr. Rennie tomorrow as he might need bone marrow biopsy. 12/01: Remained afebrile but WBC still at 0.8 with 0 neutrophil.  Bone marrow biopsy scheduled for tomorrow. 12/02: S/p bone marrow biopsy today.  Insurance authorization obtained for SNF, message sent to Dr. Rennie when he thinks he will be safe to move as neutrophil remains 0. 12/05.  Blood pressure on the lower side today will start midodrine .  White blood cell count still low at 0.9 and neutrophil count remains at 0. 12/06.  White blood cell count still low at 0.8 and neutrophil count remains at 0.   12/07.  Patient spiked a fever in the afternoon and started on Maxipime .  Blood cultures, urine and stool ordered. 12/08.  Patient still with low white count of 1 and neutrophil count remains at 0.  Continue empiric antibiotic. Per onc - bone marrow biopsy-no obvious evidence of MDS or leukemia to explain  the significant leukopenia/neutropenia. Suspect acute bone marrow insult-likely viral infection. Bone marrow also notes to have involvement by prostate cancer-however clinically not suggestive of cause of patient's neutropenia.  12/09.  Afebrile. Patient received first dose of IVIG and steroids yesterday.  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 IV antibiotics today and  switch to po amoxicillin  tomorrow. 12/10: afebrile. WBC 0.8K, Neut 0.  12/11: afebrile. WBC 1.3K, Neut 0.1K. Per oncology would not dc until abs neut ct >500 12/12: afebrile. WBC 2.6K, Neut 0.8K. OK for dc    Consultants:  Podiatry Oncology   Procedures/Surgeries: 08/02/24 bone marrow biopsy      ASSESSMENT & PLAN:   Neutropenic fever Patient has been on empiric Maxipime  and vancomycin .   No further fever few days.   Blood cultures negative for 2 days, urine negative.   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  --Suspect acute bone marrow insult-likely viral infection  --patient has not significantly improved even on Granix .  S/p 2 doses IVIG Oncology following - Dr Rennie to arrange outpatient f/u next week  prednisone  60 mg/day to continue on discharge --> outpatient taper Continue filgrastim    Metastatic prostate cancer   --Bone marrow also notes to have involvement by prostate cancer-however clinically not suggestive of cause of patient's neutropenia  status post Eligard; Erleda on hold given patient's-significant neutropenia.  Oncology following     Hypoglycemia On admission.   secondary to taking too much insulin  and poor vision on drawing up his insulin .   Tradjenta  and Glucophage  maintenance meds  will not need insulin  upon discharge.   sliding scale secondary to prednisone .   Diabetic foot infection  Osteomyelitis ruled OUT Appreciate podiatry consultation.   oral amoxicillin  pending podiatry follow up  Podiatry follow up outpatient. Wound care    Decubitus ulcer of left heel, unstageable heel protector. Podiatry follow up outpatient. Wound care    Hypotension Continue low-dose midodrine .   Hold antihypertensives     Hyperlipidemia, unspecified Continue Lipitor   Low serum vitamin B12 IM B12 followed by oral B12   Pressure injury - present on admission. Wound 07/25/24 Pressure Injury Heel Left Unstageable - Full thickness tissue loss in which the base of the injury is covered by slough (yellow, tan, gray, green or brown) and/or eschar (tan, brown or black) in the wound bed. (Active)  Bilateral buttocks stage II with red moist macerated skin and patchy areas of partial-thickness loss consistent with moisture associated skin damage. Wound care      Class 3 obesity based on BMI: Body mass index is 40.31 kg/m.SABRA Significantly low or high BMI is associated with higher medical risk.  Underweight - under 18  overweight - 25 to 29 obese - 30 or more Class 1 obesity: BMI of 30.0 to 34 Class 2 obesity: BMI of 35.0 to 39 Class 3 obesity: BMI of 40.0 to 49 Super Morbid Obesity: BMI 50-59 Super-super Morbid Obesity: BMI 60+ Healthy nutrition and physical activity advised as adjunct to other disease management and risk reduction treatments    DVT prophylaxis: lovenox  IV fluids: no continuous IV fluids  Nutrition: cardiac/carb diet  Central lines / other devices: none  Code Status: FULL CODE ACP documentation reviewed: has goals of care documentation but no formal legal docs / orders on file in [facility]  TOC needs: SNF placement - [facility] to confirm when medically stable Medical barriers to dispo: leukopenia / neutropenia. Expected medical readiness for discharge next few dqys if continues to trend appropriately.

## 2024-07-25 NOTE — Evaluation (Signed)
 Physical Therapy Evaluation Patient Details Name: [name] MRN: [id number] DOB: 11-09-1950 Today's Date: 07/25/2024  History of Present Illness  73 y.o. male with medical history significant of metastatic prostate cancer, type 2 diabetes mellitus, arthritis, hypertension and hyperlipidemia.  He presents to the hospital with low sugar.  He states his sister found him on the floor.  His sugar was 36.  Feels a little weak.  He states that he has been taking around 20 units of Lantus  and supposed to take 10.  He has also been taking 12 units of Humalog and was supposed to take 4.  He states he cannot see very well.  He draws up his own insulin  and injects his own insulin .  When he has a low sugar he feels out of it.  In the ER his sugar was in the 50s and 60s.  Clinical Impression  Pt is a pleasant 73 year old male who was admitted for hypoglycemia secondary to overdose on insulin  meds. Pt performs bed mobility with max assist and currently unable to perform transfers/ambulation at this time. Secure chat sent to MD to confirm WBing status. Pt demonstrates deficits with strength/mobility. At this time would anticipate need for +2 for safe mobility. Would benefit from skilled PT to address above deficits and promote optimal return to PLOF. Pt will continue to receive skilled PT services while admitted and will defer to TOC/care team for updates regarding disposition planning. will benefit optimally from moderate intensity post-acute PT services (<3 hours/day) and consistent staff assist for ADLs/mobility.       If plan is discharge home, recommend the following: Two people to help with walking and/or transfers;A lot of help with bathing/dressing/bathroom;Assist for transportation;Help with stairs or ramp for entrance   Can travel by private vehicle   No    Equipment Recommendations Hospital bed  Recommendations for Other Services       Functional Status Assessment Patient has had a recent  decline in their functional status and demonstrates the ability to make significant improvements in function in a reasonable and predictable amount of time.     Precautions / Restrictions Precautions Precautions: Fall Recall of Precautions/Restrictions: Intact Restrictions Weight Bearing Restrictions Per Provider Order: No Other Position/Activity Restrictions: no official orders down, sent secure chat to podiatry but haven't heard back. Would assume R LE NWB      Mobility  Bed Mobility Overal bed mobility: Needs Assistance Bed Mobility: Supine to Sit, Sit to Supine     Supine to sit: Max assist Sit to supine: Mod assist   General bed mobility comments: needs cues for sequencing and physical assist for B LE. Once seated at EOB, uses B UE to prop self up. No dizziness, however does complain of pain. Assisted back to supine    Transfers                   General transfer comment: NT, not safe at this time as pt unable to maintain NWB    Ambulation/Gait                  Stairs            Wheelchair Mobility     Tilt Bed    Modified Rankin (Stroke Patients Only)       Balance Overall balance assessment: Needs assistance, History of Falls Sitting-balance support: Feet supported, Bilateral upper extremity supported Sitting balance-Leahy Scale: Poor  Pertinent Vitals/Pain Pain Assessment Pain Assessment: Faces Faces Pain Scale: Hurts little more Pain Location: R LE Pain Descriptors / Indicators: Aching Pain Intervention(s): Limited activity within patient's tolerance, Repositioned    Home Living Family/patient expects to be discharged to:: Private residence Living Arrangements: Other relatives (sister) Available Help at Discharge: Family;Available 24 hours/day Type of Home: House Home Access: Ramped entrance       Home Layout: One level Home Equipment: Grab bars - tub/shower;Shower  Counsellor (2 wheels);Wheelchair - manual;Hand held shower head;BSC/3in1 Additional Comments: just got ramp installed and also sleeps in recliner    Prior Function Prior Level of Function : Needs assist             Mobility Comments: Uses manual w/c in house; uses RW going to car or in community short distances (parking lot to store); uses electric cart in stores.  Only 1 recent fall.  Walks about 20 feet before getting SOB (10-15 minutes to recover d/t SOB).  Navigates stairs on his own.  Sleeps in recliner. Able to self propel in [facility] ADLs Comments: sister is available 24/7 to assist with ADLs     Extremity/Trunk Assessment   Upper Extremity Assessment Upper Extremity Assessment: Overall WFL for tasks assessed    Lower Extremity Assessment Lower Extremity Assessment: Generalized weakness (B LE grossly 3/5)       Communication   Communication Communication: No apparent difficulties    Cognition Arousal: Alert Behavior During Therapy: WFL for tasks assessed/performed   PT - Cognitive impairments: No apparent impairments                       PT - Cognition Comments: sleepy, however awakens to voice. Very pleasant and agreeable to session Following commands: Intact       Cueing Cueing Techniques: Verbal cues     General Comments      Exercises     Assessment/Plan    PT Assessment Patient needs continued PT services  PT Problem List Decreased strength;Decreased balance;Decreased activity tolerance;Decreased mobility;Pain       PT Treatment Interventions Gait training;Therapeutic exercise;Balance training    PT Goals (Current goals can be found in the Care Plan section)  Acute Rehab PT Goals Patient Stated Goal: to go home PT Goal Formulation: With patient Time For Goal Achievement: 08/08/24 Potential to Achieve Goals: Good    Frequency Min 2X/week     Co-evaluation               AM-PAC PT 6 Clicks Mobility  Outcome Measure  Help needed turning from your back to your side while in a flat bed without using bedrails?: A Lot Help needed moving from lying on your back to sitting on the side of a flat bed without using bedrails?: A Lot Help needed moving to and from a bed to a chair (including a wheelchair)?: Total Help needed standing up from a chair using your arms (e.g., wheelchair or bedside chair)?: Total Help needed to walk in hospital room?: Total Help needed climbing 3-5 steps with a railing? : Total 6 Click Score: 8    End of Session   Activity Tolerance: Patient tolerated treatment well Patient left: in bed;with bed alarm set Nurse Communication: Mobility status PT Visit Diagnosis: Repeated falls (R29.6);Muscle weakness (generalized) (M62.81);History of falling (Z91.81);Difficulty in walking, not elsewhere classified (R26.2);Pain Pain - Right/Left: Right Pain - part of body: Ankle and joints of foot    Time: 8498-8488 PT Time Calculation (  min) (ACUTE ONLY): 10 min   Charges:   PT Evaluation $PT Eval Low Complexity: 1 Low   PT General Charges $$ ACUTE PT VISIT: 1 Visit         Corean Dade, PT, DPT, GCS [phone]   Jayleigh Notarianni 07/25/2024, 3:51 PM

## 2024-07-25 NOTE — Progress Notes (Signed)
 Progress Note   Patient: [name] FMW:[id number] DOB: 1951-08-22 DOA: 07/24/2024     0 DOS: the patient was seen and examined on 07/25/2024   Brief hospital course: 73 y.o. male with medical history significant of metastatic prostate cancer, type 2 diabetes mellitus, arthritis, hypertension and hyperlipidemia.  He presents to the hospital with low sugar.  He states his sister found him on the floor.  His sugar was 36.  Feels a little weak.  He states that he has been taking around 20 units of Lantus  and supposed to take 10.  He has also been taking 12 units of Humalog and was supposed to take 4.  He states he cannot see very well.  He draws up his own insulin  and injects his own insulin .  When he has a low sugar he feels out of it.  In the ER his sugar was in the 50s and 60s.  Hospitalist services contacted for further evaluation.  Patient also states he has an ulcer of his right foot that has been draining a little bit.  No fever chills or sweats.  He finished up radiation treatment for metastatic prostate cancer.  Follows with Dr. Ashley for his foot.  Patient put on D5W and IV antibiotics.  11/24.  Patient taken off D5W drip and will watch sugars another day.  Concerned about his vision and drawing up insulin .  Wondering if I can do pills instead.  Podiatry saw patient and recommended outpatient follow-up.  Assessment and Plan: * Hypoglycemia Likely secondary to taking too much insulin  and poor vision on drawing up his insulin .  Initially placed on D5 drip.  Taken off drip today we will watch sugars every 4 and see what happens.  Wondering if we can discharge on pills instead of insulin .  Concerned about his poor vision and not being able to drop the right amount.  Diabetic foot infection [facility]) Appreciate podiatry consultation.  MRI does not show any osteomyelitis.  Empirically placed on antibiotics.  Likely over to oral antibiotics tomorrow.  Will need close follow-up as  outpatient.  Decubitus ulcer of left heel, unstageable (HCC) Will get heel protector.  Hypotension Blood pressure improved from admission.  Prostate cancer metastatic to multiple sites [facility]) Patient states he has finished up radiation therapy.  Patient on apalutamide .  Hyperlipidemia, unspecified Continue Lipitor  Anemia of chronic disease Hemoglobin 8.7  Obesity, Class III, BMI 40-49.9 (morbid obesity) (HCC) BMI 40.31 with current height and weight in computer.  Leukopenia 1 dose of Granix  ordered        Subjective: Patient feels okay.  Foot a little sore.  Has trouble with his vision and is likely not taking the right amount of insulin .  Admitted with hypoglycemia.  Physical Exam: Vitals:   07/25/24 1205 07/25/24 1300 07/25/24 1314 07/25/24 1352  BP: 130/68 110/67  (!) 155/126  Pulse: (!) 106 99  (!) 104  Resp: 18 19  20   Temp:   98.9 F (37.2 C) 98.7 F (37.1 C)  TempSrc:   Oral   SpO2: 98% 98%  100%  Weight:      Height:       Physical Exam HENT:     Head: Normocephalic.  Eyes:     General: Lids are normal.  Cardiovascular:     Rate and Rhythm: Normal rate and regular rhythm.     Heart sounds: Normal heart sounds, S1 normal and S2 normal.  Pulmonary:     Breath sounds: No decreased  breath sounds, wheezing, rhonchi or rales.  Abdominal:     Palpations: Abdomen is soft.     Tenderness: There is no abdominal tenderness.  Musculoskeletal:     Right lower leg: Swelling present.     Left lower leg: Swelling present.  Skin:    General: Skin is warm.     Comments: Chronic lower extremity skin discoloration.  Wounds on the right foot look like they have scabbed over.  Soft area on the left heel.  Neurological:     Mental Status: He is alert and oriented to person, place, and time.     Data Reviewed: Creatinine 0.7, white blood cell count 1.2, hemoglobin 8.7, platelet count 287  Family Communication: Spoke with sister [name] at the  bedside  Disposition: Status is: Inpatient Remains inpatient appropriate because: We will watch sugars 1 more day off D5W.  Concerned about the patient taking insulin  because his vision.  May be able to a do pills instead.  Planned Discharge Destination: Home    Time spent: 28 minutes  Author: Charlie Patterson, MD 07/25/2024 2:15 PM  For on call review [email or web address].

## 2024-07-25 NOTE — Assessment & Plan Note (Deleted)
 Spoke with oncology yesterday and recommends continuing filgrastim  at this point in time and once white count comes up can discharge.  Patient was treated for neutropenic fever earlier in the hospital course.  Patient had low-grade temperature yesterday.  Watch for fever.

## 2024-07-25 NOTE — Consult Note (Addendum)
 WOC Nurse Consult Note: Reason for Consult: Consult requested for wounds.  Performed remotely after review of progress notes and photos in the EMR.  Bilat buttocks with red, moist macerated skin and patchy areas of partial thickness skin loss. Appearance is consistent with moisture associated skin damage.   Left heel with Unstageable  pressure injury, dry eschar  Right outer plantar foot with chronic full thickness wounds in 2 locations, 50% dark red, 50% slough/eschar   Pressure Injury POA: Yes Dressing procedure/placement/frequency:  Topical treatment orders provided for bedside nurses to perform as follows to assist with removal of nonviable tissue and protect skin:  1. Apply Santyl  to right foot wounds and left heel wound Q day, then cover with moist gauze and foam dressing.  Change foam dressing Soila # [id number]) Q 3 days or PRN soiling.  2. Apply Gerhardts butt cream BID and PRN.  Please re-consult if further assistance is needed.  Thank-you,  Stephane Fought MSN, RN, CWOCN, CWCN-AP, CNS Contact Mon-Fri 0700-1500: [phone]

## 2024-07-25 NOTE — ED Notes (Addendum)
 Pt assisted with urinal

## 2024-07-25 NOTE — Consult Note (Addendum)
 PODIATRIC SURGERY: CONSULT NOTE  Consulting Physician:  Dr. Josette  Reason for Consult: Concern for right foot infection   HPI: [name] is a 73 y.o. male with PMH significant for metastatic prostate cancer Stage 4 (just finished radiation treatment last week), type 2 diabetes mellitus (c/b neuropathy), cataracts -with vision difficulties, arthritis, hypertension, and hyperlipidemia presenting to the hospitalist status post hypoglycemic episode with sugar at 36 mg/DL in which his sister found him on the floor.  After presentation it appears that patient had accidentally overdosed with insulin  medication causing hypoglycemic episode likely secondary to vision issues in how much medication he was drawing up.  While he was admitted, it was noted that he had bilateral lower extremity wounds, which his sister had mentioned that the right side was draining a little more than usual, prompting evaluation and consultation from podiatry for bilateral foot wounds.    Reports that he has had wound to the plantar lateral aspect of the right foot for approximately 5 years.  He has been seeing wound care center with intermittent visits between podiatry clinic.  He has sister who assists in dressing changes every other day.  Currently patient dressing care regimen includes dry sterile dressing applied to the wound base with a light wrap.  Unclear if he uses any topical solution to the area to assist.  Unclear if he uses any ointment to the area to assist wound base.  [Yes] Drainage.  How long: Clear, patient changes his dressing every 2 days, his sister assists with dressing changes and she has noted that there is increased drainage.  He does not believe that there is increased purulence or has identified any abnormality with the drainage. [No] Malodor. How long: None per patient, however there is some smell associated with provider examination. -This could be gross debris that is noted to the proximal leg,  with hair and other contaminants along the venous stasis ulceration wound base -s/p cleansing, malodor reduced. [No] Redness. How long: N/AA [No] Pain. How long: N/A  Onset of wound: Gradual Course: Stagnant  Aggravating factors: Ambulation/dependency, lymphedema fluctuations with increased drainage Prior Treatment: Wound care center /podiatry -was advised to use saline cleanser, topical antibiotic ointment, gauze and wrap q. OD  Podiatry consulted for evaluation of wound possible infection to the RIGHT foot and pressure ulceration to the left lower extremity.  Denies: F/C/N/V  SOB/CP/calf pain.    PMHx:  Past Medical History:  Diagnosis Date   Arthritis    Bladder incontinence    Chronic kidney disease    Stage 3 Kidney disease   Diabetes mellitus without complication (HCC)    DVT of leg (deep venous thrombosis) (HCC)    H/O RIGHT LEG 2018   Dyspnea    occassional   Hyperlipidemia    Hypertension    Peripheral vascular disease    Sepsis (HCC)     Surgical Hx:  Past Surgical History:  Procedure Laterality Date   ABDOMINAL AORTOGRAM W/LOWER EXTREMITY N/A 10/10/2016   Procedure: Abdominal Aortogram w/Lower Extremity;  Surgeon: Cordella KANDICE Shawl, MD;  Location: [facility];  Service: Cardiovascular;  Laterality: N/A;   CATARACT EXTRACTION W/PHACO Left 05/27/2018   Procedure: CATARACT EXTRACTION PHACO AND INTRAOCULAR LENS PLACEMENT (IOC);  Surgeon: Myrna Adine Anes, MD;  Location: [facility];  Service: Ophthalmology;  Laterality: Left;  US  03:06.2 AP% 16.1 CDE 30.16 FLUID PACK LOT @ [id number] H   CATARACT EXTRACTION W/PHACO Right 07/13/2023   Procedure: CATARACT EXTRACTION PHACO AND INTRAOCULAR LENS  PLACEMENT (IOC) RIGHT DIABETIC 4.00 00:36.6;  Surgeon: Myrna Adine Anes, MD;  [facility]: [facility];  Service: Ophthalmology;  Laterality: Right;   CIRCUMCISION  1998   COLONOSCOPY WITH PROPOFOL  N/A 08/20/2023   Procedure: COLONOSCOPY WITH PROPOFOL ;  Surgeon: Unk Corinn Skiff, MD;  Location: [facility] ENDOSCOPY;  Service: Gastroenterology;  Laterality: N/A;   EYE SURGERY Left    Retina Detachment   GRAFT APPLICATION Right 05/29/2017   Procedure: GRAFT APPLICATION ( RIGHT FOOT );  Surgeon: Jama Cordella MATSU, MD;  Location: [facility];  Service: Vascular;  Laterality: Right;  graft taken from patients right thigh   IRRIGATION AND DEBRIDEMENT FOOT Right 10/03/2016   Procedure: IRRIGATION AND DEBRIDEMENT FOOT;  Surgeon: Eva Gay, DPM;  Location: [facility];  Service: Podiatry;  Laterality: Right;   LOWER EXTREMITY ANGIOGRAPHY Right 02/24/2023   Procedure: Lower Extremity Angiography;  Surgeon: Jama Cordella MATSU, MD;  Location: [facility];  Service: Cardiovascular;  Laterality: Right;   LOWER EXTREMITY INTERVENTION  10/10/2016   Procedure: Lower Extremity Intervention;  Surgeon: Cordella MATSU Jama, MD;  Location: [facility];  Service: Cardiovascular;;   PERIPHERAL VASCULAR BALLOON ANGIOPLASTY Left 10/10/2016   Procedure: Peripheral Vascular Balloon Angioplasty;  Surgeon: Cordella MATSU Jama, MD;  Location: [facility];  Service: Cardiovascular;  Laterality: Left;   TONSILLECTOMY     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/2022   Procedure: TRANSMETATARSAL AMPUTATION;  Surgeon: Gay Eva, DPM;  Location: [facility];  Service: Podiatry;  Laterality: Right;   WOUND DEBRIDEMENT Right 11/07/2016   Procedure: DEBRIDEMENT OF WOUND AND BONE RIGHT FOOT AND APPLY WOUND VAC;  Surgeon: Eva Gay, DPM;  Location: [facility];  Service: Podiatry;  Laterality: Right;    FHx:  Family History  Problem Relation Age of Onset   Diabetes Mother    Hypertension Mother    Diabetes Father    Hypertension Father    Cancer Father     Social History:  reports that he quit smoking about 49 years ago. His smoking use included pipe and cigarettes. He has never used smokeless tobacco. He reports that he does not drink alcohol and does not use drugs.  Allergies:   Allergies  Allergen Reactions   Sulfa Antibiotics Rash   Piperacillin -Tazobactam In Dex     (Not in a hospital admission)   Physical Exam: Blood pressure 135/68, pulse (!) 114, temperature 98.4 F (36.9 C), temperature source Oral, resp. rate (!) 21, height 5' 11 (1.803 m), weight 131.1 kg, SpO2 97%.  Constitutional: Patient appears of normal development and good nutritional status for stated age, well-groomed, and of normal body habitus. Phonating appropriately.   Psychiatric: Alert and oriented to time, place, person and situation. The patient is noted to have good judgment and insight into the hospital visit  FOCUSED LOWER EXTREMITY EXAMINATION:   Neurological:  - Protective sensation diminished / absent - Gross protective sensation diminished bilaterally.  - No focal motor or sensory deficits identified bilaterally.   Vascular:  - Dorsalis Pedis artery on palpation: 1 - Posterior Tibial artery on palpation: Nonpalpable - Capillary Filling Times: Sluggish to left  - Peripheral edema: 2+ - Dependency Rubor: Yes  Musculoskeletal:  - S/p right TMA - Ankle Joint ROM: decreased, equinus noted bilaterally Global foot - TTP: Localized to the left posterior heel along pressure sore area  Dermatological:  - Skin quality: Atrophic with hyperkeratotic buildup/ plaque buildup secondary to hygiene/skin condition - Interdigital spaces: c/d/i   #WOUND 1  Type:  Full thickness neuropathic ulceration x 2  Location: Subfifth met right plantar TMA site Adjacent tissue/ Wound borders: Hyperkeratotic PTB: No Malodor: No Active Drainage: No; If yes, consistency: There was noted to be overlying fibrotic film/hyperkeratotic tissue that led to entrapment of serous fluid.  This was debrided and underlying granular wound base was noted without active drainage.  Pre Debridement Measurements: 1.7 cm (l) x 1.0 cm (w) x 0.1 cm (d)  Post Debridement Measurements: 2 cm (l) x 1.5 cm (w) x 0.3 cm  (d)  Excisional debridement down to and including the subcutaneous tissue Methods for Debridement: Sharp (STERILE #15/10 blade), Mechanical (STERILE Tissue nipper / STERILE Curette) Cautery needed(?): no  #WOUND 2 Type: Venous stasis ulceration Location:  lateral R lower leg  --- Wound base: 10% Granular ; 80% Fibrosis ; 10% Necrotic  Adjacent tissue/ Wound borders:  hyperkeratotic  PTB: No Malodor: yes - gross debris within the wound bed  Active Drainage: No; If yes, consistency: N/A  #WOUND 3 Type: Pressure injury/ulcer stage III Location: Posterior Achilles/heel --- Wound base: 20 % Granular ; 10% Fibrosis ; 70 % Necrotic  Adjacent tissue/ Wound borders: Rolled PTB: No Malodor: No Active Drainage: No; If yes, consistency: N/A   Results for orders placed or performed during the hospital encounter of 07/24/24 (from the past 48 hours)  POC CBG, ED     Status: Abnormal   Collection Time: 07/24/24  3:31 PM  Result Value Ref Range   Glucose-Capillary 61 (L) 70 - 99 mg/dL    Comment: Glucose reference range applies only to samples taken after fasting for at least 8 hours.  CBC with Differential     Status: Abnormal   Collection Time: 07/24/24  3:33 PM  Result Value Ref Range   WBC 2.3 (L) 4.0 - 10.5 K/uL   RBC 3.08 (L) 4.22 - 5.81 MIL/uL   Hemoglobin 8.8 (L) 13.0 - 17.0 g/dL   HCT 72.9 (L) 60.9 - 47.9 %   MCV 87.7 80.0 - 100.0 fL   MCH 28.6 26.0 - 34.0 pg   MCHC 32.6 30.0 - 36.0 g/dL   RDW 82.1 (H) 88.4 - 84.4 %   Platelets 264 150 - 400 K/uL   nRBC 0.0 0.0 - 0.2 %   Neutrophils Relative % 59 %   Neutro Abs 1.4 (L) 1.7 - 7.7 K/uL   Lymphocytes Relative 11 %   Lymphs Abs 0.3 (L) 0.7 - 4.0 K/uL   Monocytes Relative 17 %   Monocytes Absolute 0.4 0.1 - 1.0 K/uL   Eosinophils Relative 13 %   Eosinophils Absolute 0.3 0.0 - 0.5 K/uL   Basophils Relative 0 %   Basophils Absolute 0.0 0.0 - 0.1 K/uL   Immature Granulocytes 0 %   Abs Immature Granulocytes 0.00 0.00 - 0.07 K/uL     Comment: Performed at [facility], [address] [place], [place] [address]  Comprehensive metabolic panel     Status: Abnormal   Collection Time: 07/24/24  3:33 PM  Result Value Ref Range   Sodium 140 135 - 145 mmol/L   Potassium 4.5 3.5 - 5.1 mmol/L   Chloride 109 98 - 111 mmol/L   CO2 19 (L) 22 - 32 mmol/L   Glucose, Bld 71 70 - 99 mg/dL    Comment: Glucose reference range applies only to samples taken after fasting for at least 8 hours.   BUN 20 8 - 23 mg/dL   Creatinine, Ser 9.08 0.61 - 1.24 mg/dL  Calcium  8.4 (L) 8.9 - 10.3 mg/dL   Total Protein 6.5 6.5 - 8.1 g/dL   Albumin 3.3 (L) 3.5 - 5.0 g/dL   AST 37 15 - 41 U/L   ALT 12 0 - 44 U/L   Alkaline Phosphatase 133 (H) 38 - 126 U/L   Total Bilirubin 0.2 0.0 - 1.2 mg/dL   GFR, Estimated >39 >39 mL/min    Comment: (NOTE) Calculated using the CKD-EPI Creatinine Equation (2021)    Anion gap 11 5 - 15    Comment: Performed at [facility], [address] [place], [place] [address]  Lipase, blood     Status: None   Collection Time: 07/24/24  3:33 PM  Result Value Ref Range   Lipase 14 11 - 51 U/L    Comment: Performed at [facility], [address] [place], [place] [address]  Procalcitonin     Status: None   Collection Time: 07/24/24  3:33 PM  Result Value Ref Range   Procalcitonin <0.10 ng/mL    Comment: (NOTE)   Sepsis PCT Algorithm          Lower Respiratory Tract Infection                                         PCT Algorithm -----------------------------------------------------------------  <0.5 ng/mL                    <0.10 ng/mL  Associated with low           Antibiotic therapy strongly   risk for progression          discouraged. Indicates absence   to severe sepsis              of bacteria infection  and/or septic shock             --------------------------------------------------------------  0.5-2.0 ng/mL                 0.10-0.25 ng/mL  Recommended to retest          Antibiotic therapy discouraged.  PCT within 6-24 hours         Bacterial infection unlikely  ------------------------------------------------------------  >2 ng/mL                      0.26-0.50 ng/mL  Associated with high risk     Antibiotic therapy encouraged.  for progression to severe     Bacterial infection possible  sepsis/and or septic shock    ------------------------------                                 >0.50 ng/mL                                Antibiotic therapy strongly                                 encouraged.                                Suggestive of presence of  bacterial infection.                                 -------------------------------------------------------------------  < or = 0.50 ng/mL OR          < or = 0.25 OR 80% decrease in PCT  80% decrease in PCT           Antibiotic therapy   Antibiotic therapy may        may be discontinued  be discontinued                                 Performed at [facility], [address] [place], [place] [address]   Lactic acid, plasma     Status: None   Collection Time: 07/24/24  3:36 PM  Result Value Ref Range   Lactic Acid, Venous 1.6 0.5 - 1.9 mmol/L    Comment: Performed at [facility], [address] [place], [place] [address]  POC CBG, ED     Status: Abnormal   Collection Time: 07/24/24  5:01 PM  Result Value Ref Range   Glucose-Capillary 55 (L) 70 - 99 mg/dL    Comment: Glucose reference range applies only to samples taken after fasting for at least 8 hours.  POC CBG, ED     Status: Abnormal   Collection Time: 07/24/24  5:20 PM  Result Value Ref Range   Glucose-Capillary 64 (L) 70 - 99 mg/dL    Comment: Glucose reference range applies only to samples taken after fasting for at least 8 hours.  POC CBG, ED     Status: None   Collection Time: 07/24/24  5:46 PM  Result Value Ref Range   Glucose-Capillary 81 70 - 99 mg/dL    Comment: Glucose reference  range applies only to samples taken after fasting for at least 8 hours.  POC CBG, ED     Status: None   Collection Time: 07/24/24  6:40 PM  Result Value Ref Range   Glucose-Capillary 70 70 - 99 mg/dL    Comment: Glucose reference range applies only to samples taken after fasting for at least 8 hours.  Urinalysis, Complete w Microscopic -Urine, Clean Catch     Status: Abnormal   Collection Time: 07/24/24 10:51 PM  Result Value Ref Range   Color, Urine YELLOW (A) YELLOW   APPearance CLEAR (A) CLEAR   Specific Gravity, Urine 1.023 1.005 - 1.030   pH 5.0 5.0 - 8.0   Glucose, UA NEGATIVE NEGATIVE mg/dL   Hgb urine dipstick NEGATIVE NEGATIVE   Bilirubin Urine NEGATIVE NEGATIVE   Ketones, ur NEGATIVE NEGATIVE mg/dL   Protein, ur NEGATIVE NEGATIVE mg/dL   Nitrite NEGATIVE NEGATIVE   Leukocytes,Ua NEGATIVE NEGATIVE   RBC / HPF 0-5 0 - 5 RBC/hpf   WBC, UA 0-5 0 - 5 WBC/hpf   Bacteria, UA NONE SEEN NONE SEEN   Squamous Epithelial / HPF 0-5 0 - 5 /HPF   Mucus PRESENT     Comment: Performed at [facility], [address] [place], [place] [address]  POC CBG, ED     Status: Abnormal   Collection Time: 07/25/24 12:05 AM  Result Value Ref Range   Glucose-Capillary 148 (H) 70 - 99 mg/dL    Comment: Glucose reference range applies only to samples taken after fasting for at least 8 hours.  POC CBG, ED     Status: Abnormal   Collection Time: 07/25/24  1:50 AM  Result Value Ref Range   Glucose-Capillary 146 (H) 70 - 99 mg/dL    Comment: Glucose reference range applies only to samples taken after fasting for at least 8 hours.  POC CBG, ED     Status: Abnormal   Collection Time: 07/25/24  5:16 AM  Result Value Ref Range   Glucose-Capillary 138 (H) 70 - 99 mg/dL    Comment: Glucose reference range applies only to samples taken after fasting for at least 8 hours.  Basic metabolic panel     Status: Abnormal   Collection Time: 07/25/24  5:20 AM  Result Value Ref Range   Sodium 136 135 -  145 mmol/L   Potassium 4.4 3.5 - 5.1 mmol/L   Chloride 111 98 - 111 mmol/L   CO2 18 (L) 22 - 32 mmol/L   Glucose, Bld 149 (H) 70 - 99 mg/dL    Comment: Glucose reference range applies only to samples taken after fasting for at least 8 hours.   BUN 14 8 - 23 mg/dL   Creatinine, Ser 9.29 0.61 - 1.24 mg/dL   Calcium  7.9 (L) 8.9 - 10.3 mg/dL   GFR, Estimated >39 >39 mL/min    Comment: (NOTE) Calculated using the CKD-EPI Creatinine Equation (2021)    Anion gap 8 5 - 15    Comment: Performed at [facility], [address] [place], [place] [address]  CBC     Status: Abnormal   Collection Time: 07/25/24  5:20 AM  Result Value Ref Range   WBC 1.2 (LL) 4.0 - 10.5 K/uL    Comment: This critical result has been called to NICOLE HARDY RN by Ponce Belonghilot on 07/25/2024 05:50:03, and has been read back.   RBC 3.04 (L) 4.22 - 5.81 MIL/uL   Hemoglobin 8.7 (L) 13.0 - 17.0 g/dL   HCT 73.1 (L) 60.9 - 47.9 %   MCV 88.2 80.0 - 100.0 fL   MCH 28.6 26.0 - 34.0 pg   MCHC 32.5 30.0 - 36.0 g/dL   RDW 82.4 (H) 88.4 - 84.4 %   Platelets 287 150 - 400 K/uL   nRBC 0.0 0.0 - 0.2 %    Comment: Performed at [facility], [address] [place], [place] [address]  Sedimentation rate     Status: Abnormal   Collection Time: 07/25/24  5:20 AM  Result Value Ref Range   Sed Rate 37 (H) 0 - 20 mm/hr    Comment: Performed at [facility], [address] [place], [place] [address]  POC CBG, ED     Status: Abnormal   Collection Time: 07/25/24  6:58 AM  Result Value Ref Range   Glucose-Capillary 135 (H) 70 - 99 mg/dL    Comment: Glucose reference range applies only to samples taken after fasting for at least 8 hours.  POC CBG, ED     Status: Abnormal   Collection Time: 07/25/24  8:09 AM  Result Value Ref Range   Glucose-Capillary 136 (H) 70 - 99 mg/dL    Comment: Glucose reference range applies only to samples taken after fasting for at least 8 hours.   MR FOOT RIGHT W WO  CONTRAST Result Date: 07/24/2024 CLINICAL DATA:  Right foot ulcer.  History of prior amputations. EXAM: MRI OF THE RIGHT FOREFOOT WITHOUT AND WITH CONTRAST TECHNIQUE: Multiplanar, multisequence MR imaging of the right foot was performed before and  after the administration of intravenous contrast. CONTRAST:  10mL GADAVIST  GADOBUTROL  1 MMOL/ML IV SOLN COMPARISON:  Radiographs 07/24/2024 FINDINGS: There is an open wound noted over the lateral aspect of the foot near the fifth metatarsal base. Again demonstrated are prior mid metatarsal amputations. I do not see any findings suspicious for osteomyelitis or septic arthritis. No rim enhancing subcutaneous abscess. Stable appearing exuberant periosteal reaction involving the fourth metatarsal shaft. Diffuse subcutaneous soft tissue swelling/edema suggesting cellulitis. Diffuse myositis and advanced fatty atrophy of the foot musculature but no evidence of pyomyositis. The major tendons and ligaments appear intact. IMPRESSION: 1. Open wound over the lateral aspect of the foot near the fifth metatarsal base. No findings suspicious for osteomyelitis or septic arthritis. 2. Diffuse subcutaneous soft tissue swelling/edema suggesting cellulitis. 3. Diffuse myositis and advanced fatty atrophy of the foot musculature but no evidence of pyomyositis. Electronically Signed   By: MYRTIS Stammer M.D.   On: 07/24/2024 20:10   CT Cervical Spine Wo Contrast Result Date: 07/24/2024 CLINICAL DATA:  Syncope, fell EXAM: CT CERVICAL SPINE WITHOUT CONTRAST TECHNIQUE: Multidetector CT imaging of the cervical spine was performed without intravenous contrast. Multiplanar CT image reconstructions were also generated. RADIATION DOSE REDUCTION: This exam was performed according to the departmental dose-optimization program which includes automated exposure control, adjustment of the mA and/or kV according to patient size and/or use of iterative reconstruction technique. COMPARISON:  06/01/2024  FINDINGS: Alignment: Slight reversal cervical lordosis likely due to multilevel spondylosis and facet hypertrophy. Otherwise alignment is anatomic. Skull base and vertebrae: Lytic bony metastases are noted within the spinous process of C2, within the body and right transverse process of C7, and within the T1 vertebral body, similar to recent PET scan. There are no acute displaced fractures. Soft tissues and spinal canal: No prevertebral fluid or swelling. No visible canal hematoma. Disc levels: Bony fusion across the C3-4 facet joints. There is diffuse multilevel spondylosis with disc space narrowing and prominent anterior osteophyte greatest from C4-5 through C7-T1. Upper chest: Mottled sclerosis of the visualized portions of the thoracic cage consistent with bony metastases. Airway is patent. Lung apices are clear. Other: Reconstructed images demonstrate no additional findings. IMPRESSION: 1. No acute cervical spine fracture. 2. Multifocal bony metastases, consistent with metastatic prostate cancer and not appreciably changed from prior PET scan. Electronically Signed   By: Ozell Daring M.D.   On: 07/24/2024 16:42   CT Head Wo Contrast Result Date: 07/24/2024 CLINICAL DATA:  Syncope, fell EXAM: CT HEAD WITHOUT CONTRAST TECHNIQUE: Contiguous axial images were obtained from the base of the skull through the vertex without intravenous contrast. RADIATION DOSE REDUCTION: This exam was performed according to the departmental dose-optimization program which includes automated exposure control, adjustment of the mA and/or kV according to patient size and/or use of iterative reconstruction technique. COMPARISON:  None Available. FINDINGS: Brain: No acute infarct or hemorrhage. Lateral ventricles and midline structures are unremarkable. No acute extra-axial fluid collections. No mass effect. Vascular: No hyperdense vessel or unexpected calcification. Diffuse atherosclerosis. Skull: Normal. Negative for fracture or  focal lesion. Sinuses/Orbits: No acute finding. Other: None. IMPRESSION: 1. No acute intracranial process. Electronically Signed   By: Ozell Daring M.D.   On: 07/24/2024 16:39   DG Chest Portable 1 View Result Date: 07/24/2024 EXAM: 1 VIEW(S) XRAY OF THE CHEST 07/24/2024 04:10:00 PM COMPARISON: Comparison with 01/03/2022. CLINICAL HISTORY: confusion FINDINGS: LUNGS AND PLEURA: Shallow inspiration with linear atelectasis in the lung bases. No airspace consolidation or edema. No pleural effusion. No  pneumothorax. HEART AND MEDIASTINUM: Heart size and pulmonary vascularity are normal. No acute abnormality of the cardiac and mediastinal silhouettes. BONES AND SOFT TISSUES: Degenerative changes in the spine and shoulders. IMPRESSION: 1. No acute cardiopulmonary process identified. 2. Shallow inspiration with linear atelectasis in the lung bases. Electronically signed by: Elsie Gravely MD 07/24/2024 04:15 PM EST RP Workstation: [id number]   DG Foot 2 Views Right Result Date: 07/24/2024 CLINICAL DATA:  Right foot pain. EXAM: RIGHT FOOT - 2 VIEW COMPARISON:  None Available. FINDINGS: Status post transmetatarsal amputation. No acute fracture or dislocation. The bones are osteopenic. Degenerative changes of the midfoot and spurring. Soft tissues thickening over the stump. No soft tissue gas. Vascular calcifications noted. IMPRESSION: 1. No acute fracture or dislocation. 2. Status post transmetatarsal amputation. Electronically Signed   By: Vanetta Chou M.D.   On: 07/24/2024 16:14   DG Foot 2 Views Left Result Date: 07/24/2024 CLINICAL DATA:  Left heel ulcer.  Evaluate for soft tissue gas. EXAM: LEFT FOOT - 2 VIEW COMPARISON:  Left foot radiograph dated 05/03/2021. FINDINGS: No acute fracture or dislocation. The bones are osteopenic. No bone erosion or periosteal elevation. Vascular calcifications noted. No soft tissue gas. IMPRESSION: 1. No acute fracture or dislocation. 2. Osteopenia. Electronically  Signed   By: Vanetta Chou M.D.   On: 07/24/2024 16:13    Assessment  Ulcer of right foot with fat layer exposed  Status post transmetatarsal amputation right foot  Pressure ulcer stage III left posterior heel   Excisional debridement down to and including the subcutaneous tissue was performed for the right plantar foot wound (as noted above) Debridment of ulcer: Type: Full thickness neuropathic ulceration x 2  Location: Subfifth met right plantar TMA site Adjacent tissue/ Wound borders: Hyperkeratotic PTB: No Malodor: No Active Drainage: No; If yes, consistency: There was noted to be overlying fibrotic film/hyperkeratotic tissue that led to entrapment of serous fluid.  This was debrided and underlying granular wound base was noted without active drainage.  Pre Debridement Measurements: 1.7 cm (l) x 1.0 cm (w) x 0.1 cm (d)  Post Debridement Measurements: 2 cm (l) x 1.5 cm (w) x 0.3 cm (d)  Excisional debridement down to and including the subcutaneous tissue Tissue removed: Epidermis, dermis, subcutaneous tissue Methods for Debridement: Sharp (STERILE #15/10 blade), Mechanical (STERILE Tissue nipper / STERILE Curette) Cautery needed(?): no  Left lower extremity concerns It appears that the pressure ulceration to the left lower extremity was apparent in clinic previously, and subsequently healed with reduced pressure measures.  Unclear if patient has been more bedbound since initiation of radiation treatments, however it is noted to have returned and is approximately a stage II or stage III pressure ulceration.  I advise heel offloading measures and stabilization of the wound base.   - Wound care: daily Betadine  application to the area with coverage of sterile dressing.  No compression - Ordered Prevalon heel offloading boots to be applied while in bed or wheelchair. - Activity: Weightbearing as tolerated on the left lower extremity.  Right lower extremity concerns Right lower  extremity appears fairly superficial official without any concerns for deeper seeded infection.  Drainage may be focal area that came from increased fluid on the lower extremity secondary to the lymphedema.  There appears to be no purulence or active malodor that is coming from the site.  Granular wound base is noted on debridement.  Debridement was carried out at bedside with sterile 15 blade that included the epidermis, dermis, and into/including  the subcutaneous layer without incident.  Patient tolerated the procedure well. - Imaging appears stable and without signs of deeper involved infection. - ESR 37 which is reassuring to rule out deeper seeded infection that may involve bone  *There is also noted to be a new finding of a venous stasis ulceration to the right lower extremity lateral leg.  This wound appeared to be contaminated with gross debris of hair, lint, debris.  This was cleansed and dressed.  Patient should use Xeroform or Betadine  paint to the area and change daily to every other day.  Venous stasis ulcerations generally improved with increased compression and fluid level maintenance.  Given patient has wounds to the plantar foot this compression would only be warranted to the lower extremity.   Overall, wounds appear healthy and do not allude to any deeper seeded infection.  I advised patient on the new dressing care instructions.  He should have dressing supplies that can be set up by our clinic or potentially home health RN services.  He should be reestablished with wound care center given multiple wounds and prolonged healing.  I advised that we may want to change the dressings to a collagen based dressing given the granular appearance of the wounds.  Patient is aware.  Patient to set up with more acute follow-up on discharge.  He should not wait until January to see podiatry clinic again, this appears to be a miscommunication between the office and patient can be seen more frequent  intervals for wound care maintenance -whether that be with podiatry or with wound care center.  Patient is aware.   Plan  - ABX: Appreciate Medicine / ID / Pharmacy assist with antibiotic stewardship and appropriate coverage.  No need for antibiotic coverage from pedal standpoint. - Diet: Per primary - Activity: As tolerated to the left lower extremity; primarily heel weightbearing for transfer to the right lower extremity.  Patient to have Prevalon heel offloading boots while in bed, especially to the left lower extremity.  Patient is aware. - Wound Care: Dressing instructions.  Please see above.  If wound care consult is held during hospitalization can defer to their treatment recommendations.   Dispo: Patient to be followed by podiatry remotely while hospitalized unless acute concern arises.  We will follow closely until discharge and dispense an appropriate follow-up on discharge for podiatry clinic within 1 to 2 weeks.  Patient is aware.  Sakina Briones KANDICE Blush 07/25/2024, 9:19 AM

## 2024-07-25 NOTE — ED Notes (Signed)
Pt placed on protective precautions.

## 2024-07-26 ENCOUNTER — Inpatient Hospital Stay

## 2024-07-26 DIAGNOSIS — L89620 Pressure ulcer of left heel, unstageable: Secondary | ICD-10-CM | POA: Diagnosis not present

## 2024-07-26 DIAGNOSIS — I9589 Other hypotension: Secondary | ICD-10-CM | POA: Diagnosis not present

## 2024-07-26 DIAGNOSIS — E11628 Type 2 diabetes mellitus with other skin complications: Secondary | ICD-10-CM | POA: Diagnosis not present

## 2024-07-26 DIAGNOSIS — E162 Hypoglycemia, unspecified: Secondary | ICD-10-CM | POA: Diagnosis not present

## 2024-07-26 DIAGNOSIS — L89302 Pressure ulcer of unspecified buttock, stage 2: Secondary | ICD-10-CM

## 2024-07-26 LAB — GLUCOSE, CAPILLARY
Glucose-Capillary: 109 mg/dL — ABNORMAL HIGH (ref 70–99)
Glucose-Capillary: 110 mg/dL — ABNORMAL HIGH (ref 70–99)
Glucose-Capillary: 141 mg/dL — ABNORMAL HIGH (ref 70–99)
Glucose-Capillary: 144 mg/dL — ABNORMAL HIGH (ref 70–99)
Glucose-Capillary: 165 mg/dL — ABNORMAL HIGH (ref 70–99)
Glucose-Capillary: 184 mg/dL — ABNORMAL HIGH (ref 70–99)

## 2024-07-26 LAB — URINALYSIS, W/ REFLEX TO CULTURE (INFECTION SUSPECTED)
Bilirubin Urine: NEGATIVE
Glucose, UA: NEGATIVE mg/dL
Hgb urine dipstick: NEGATIVE
Ketones, ur: NEGATIVE mg/dL
Leukocytes,Ua: NEGATIVE
Nitrite: NEGATIVE
Protein, ur: NEGATIVE mg/dL
Specific Gravity, Urine: 1.023 (ref 1.005–1.030)
pH: 5 (ref 5.0–8.0)

## 2024-07-26 LAB — CBC
HCT: 24.8 % — ABNORMAL LOW (ref 39.0–52.0)
Hemoglobin: 8.2 g/dL — ABNORMAL LOW (ref 13.0–17.0)
MCH: 28.6 pg (ref 26.0–34.0)
MCHC: 33.1 g/dL (ref 30.0–36.0)
MCV: 86.4 fL (ref 80.0–100.0)
Platelets: 286 K/uL (ref 150–400)
RBC: 2.87 MIL/uL — ABNORMAL LOW (ref 4.22–5.81)
RDW: 17.7 % — ABNORMAL HIGH (ref 11.5–15.5)
WBC: 1.1 K/uL — CL (ref 4.0–10.5)
nRBC: 0 % (ref 0.0–0.2)

## 2024-07-26 LAB — CREATININE, SERUM
Creatinine, Ser: 0.75 mg/dL (ref 0.61–1.24)
GFR, Estimated: >60 mL/min (ref >60)

## 2024-07-26 MED ORDER — SODIUM CHLORIDE 0.9 % IV SOLN
2.0000 g | Freq: Three times a day (TID) | INTRAVENOUS | Status: DC
  Administered 2024-07-26 – 2024-07-30 (×13): 2 g via INTRAVENOUS
  Filled 2024-07-26 (×13): qty 12.5

## 2024-07-26 MED ORDER — LINAGLIPTIN 5 MG PO TABS
5.0000 mg | ORAL_TABLET | Freq: Every day | ORAL | Status: DC
  Administered 2024-07-26 – 2024-08-12 (×18): 5 mg via ORAL
  Filled 2024-07-26 (×18): qty 1

## 2024-07-26 MED ORDER — METFORMIN HCL 500 MG PO TABS
1000.0000 mg | ORAL_TABLET | Freq: Two times a day (BID) | ORAL | Status: AC
Start: 2024-07-26 — End: ?
  Administered 2024-07-26 – 2024-08-12 (×34): 1000 mg via ORAL
  Filled 2024-07-26 (×34): qty 2

## 2024-07-26 MED ORDER — INSULIN ASPART 100 UNIT/ML IJ SOLN
0.0000 [IU] | Freq: Three times a day (TID) | INTRAMUSCULAR | Status: DC
  Administered 2024-07-26 – 2024-08-01 (×4): 1 [IU] via SUBCUTANEOUS
  Filled 2024-07-26 (×4): qty 1

## 2024-07-26 MED ORDER — INSULIN ASPART 100 UNIT/ML IJ SOLN: 0.0000 [IU] | Freq: Every day | INTRAMUSCULAR | Status: DC

## 2024-07-26 MED ORDER — DOXYCYCLINE HYCLATE 100 MG PO TABS
100.0000 mg | ORAL_TABLET | Freq: Two times a day (BID) | ORAL | Status: DC
  Administered 2024-07-26 – 2024-07-27 (×2): 100 mg via ORAL
  Filled 2024-07-26 (×2): qty 1

## 2024-07-26 MED ORDER — SODIUM CHLORIDE 0.9 % IV BOLUS
500.0000 mL | Freq: Once | INTRAVENOUS | Status: AC
  Administered 2024-07-26: 500 mL via INTRAVENOUS

## 2024-07-26 MED ORDER — DOXYCYCLINE HYCLATE 100 MG PO TABS
100.0000 mg | ORAL_TABLET | Freq: Two times a day (BID) | ORAL | Status: DC
  Administered 2024-07-26: 100 mg via ORAL
  Filled 2024-07-26: qty 1

## 2024-07-26 MED ORDER — SODIUM CHLORIDE 0.9 % IV SOLN: INTRAVENOUS | Status: DC

## 2024-07-26 NOTE — Evaluation (Signed)
 Occupational Therapy Evaluation Patient Details Name: [name] MRN: [id number] DOB: Jan 08, 1951 Today's Date: 07/26/2024   History of Present Illness   Pt is a 73 year old male admitted with hypoglycemia, diabetic foot infection, decubitus ulcer of L heel, hypotension     PMH significant for metastatic prostate cancer, type 2 diabetes mellitus, arthritis, hypertension and hyperlipidemia.     Clinical Impressions Chart reviewed to date, pt greeted semi supine in bed, linens noted to be wet. He is alert and oriented x4, ?awareness of current level of functioning/problem solving. Will continue to assess. LLE is wrapped, RLE with heel protector. PTA pt reports he recently got out of rehab and was performing ADLs with MOD I, his sister helped as needed. He used mwc around the house but walked up to approx 20' with RW. Will need to confirm. Pt presents with deficits in strength, endurance, activity tolerance, balance, cognition, affecting safe and optimal ADL completion. He is performing ADL/functional mobility below PLOF. See further details below. Pt will benefit from acute OT to address deficits and to facilitate return to PLOF. Pt is left as received, all needs met. OT will follow.      If plan is discharge home, recommend the following:   A lot of help with walking and/or transfers;A lot of help with bathing/dressing/bathroom     Functional Status Assessment   Patient has had a recent decline in their functional status and demonstrates the ability to make significant improvements in function in a reasonable and predictable amount of time.     Equipment Recommendations   Other (comment) (defer to next venue of care)     Recommendations for Other Services         Precautions/Restrictions   Precautions Precautions: Fall Recall of Precautions/Restrictions: Intact Restrictions Weight Bearing Restrictions Per Provider Order: No Other Position/Activity Restrictions:  ok for WBing per Dr Josette     Mobility Bed Mobility Overal bed mobility: Needs Assistance Bed Mobility: Supine to Sit, Sit to Supine     Supine to sit: Max assist Sit to supine: Max assist        Transfers Overall transfer level: Needs assistance   Transfers: Sit to/from Stand, Bed to chair/wheelchair/BSC Sit to Stand: Mod assist, +2 physical assistance, From elevated surface           General transfer comment: lateral scoot up the bed with MOD A to his R      Balance Overall balance assessment: Needs assistance, History of Falls Sitting-balance support: Feet supported, Bilateral upper extremity supported Sitting balance-Leahy Scale: Fair     Standing balance support: Bilateral upper extremity supported, During functional activity, Reliant on assistive device for balance Standing balance-Leahy Scale: Poor                             ADL either performed or assessed with clinical judgement   ADL Overall ADL's : Needs assistance/impaired Eating/Feeding: Supervision/ safety;Sitting           Lower Body Bathing: Maximal assistance;Bed level   Upper Body Dressing : Minimal assistance;Sitting Upper Body Dressing Details (indicate cue type and reason): doff/donn gown Lower Body Dressing: Maximal assistance Lower Body Dressing Details (indicate cue type and reason): donn sock     Toileting- Clothing Manipulation and Hygiene: Maximal assistance;Bed level               Vision Patient Visual Report: No change from baseline  Perception         Praxis         Pertinent Vitals/Pain Pain Assessment Pain Assessment: No/denies pain     Extremity/Trunk Assessment Upper Extremity Assessment Upper Extremity Assessment: Overall WFL for tasks assessed   Lower Extremity Assessment Lower Extremity Assessment: Generalized weakness       Communication Communication Communication: No apparent difficulties   Cognition Arousal:  Alert Behavior During Therapy: WFL for tasks assessed/performed Cognition: No family/caregiver present to determine baseline, Cognition impaired           Executive functioning impairment (select all impairments): Problem solving                   Following commands: Intact       Cueing  General Comments   Cueing Techniques: Verbal cues  vss, LLE wrapped, RLE with heel protector, secure chat sent to nurse re: order for prevlon boot   Exercises Other Exercises Other Exercises: edu re role of OT, role of rehab, discharge recommendations   Shoulder Instructions      Home Living Family/patient expects to be discharged to:: Private residence Living Arrangements: Other relatives (sister) Available Help at Discharge: Family;Available 24 hours/day Type of Home: House Home Access: Ramped entrance     Home Layout: One level     Bathroom Shower/Tub: Chief Strategy Officer: Standard     Home Equipment: Grab bars - tub/shower;Shower Counsellor (2 wheels);Wheelchair - manual;Hand held shower head;BSC/3in1   Additional Comments: sleeps in recliner      Prior Functioning/Environment               Mobility Comments: mwc in house, will walk up to approx 20' per chart; ADLs Comments: per chart sister is available to assist with ADLs, he reports he is MOD I in ADL but ?historian    OT Problem List: Decreased strength;Decreased activity tolerance;Decreased knowledge of use of DME or AE;Decreased safety awareness;Impaired balance (sitting and/or standing)   OT Treatment/Interventions: DME and/or AE instruction;Self-care/ADL training;Therapeutic exercise;Therapeutic activities;Balance training;Energy conservation;Patient/family education      OT Goals(Current goals can be found in the care plan section)   Acute Rehab OT Goals Patient Stated Goal: go home OT Goal Formulation: With patient Time For Goal Achievement: 08/09/24 Potential to  Achieve Goals: Fair ADL Goals Pt Will Perform Grooming: with modified independence;sitting Pt Will Perform Lower Body Dressing: with mod assist;sitting/lateral leans Pt Will Transfer to Toilet: stand pivot transfer;with min assist Pt Will Perform Toileting - Clothing Manipulation and hygiene: with min assist;sitting/lateral leans   OT Frequency:  Min 2X/week    Co-evaluation              AM-PAC OT 6 Clicks Daily Activity     Outcome Measure Help from another person eating meals?: None Help from another person taking care of personal grooming?: None Help from another person toileting, which includes using toliet, bedpan, or urinal?: A Lot Help from another person bathing (including washing, rinsing, drying)?: A Lot Help from another person to put on and taking off regular upper body clothing?: A Little Help from another person to put on and taking off regular lower body clothing?: A Lot 6 Click Score: 17   End of Session Equipment Utilized During Treatment: Rolling walker (2 wheels) Nurse Communication: Mobility status  Activity Tolerance: Patient tolerated treatment well Patient left: in bed;with call bell/phone within reach;with bed alarm set  OT Visit Diagnosis: Other abnormalities of gait and mobility (R26.89);Muscle  weakness (generalized) (M62.81)                Time: 8892-8850 OT Time Calculation (min): 42 min Charges:  OT General Charges $OT Visit: 1 Visit OT Evaluation $OT Eval Moderate Complexity: 1 Mod  Therisa Sheffield, OTD OTR/L  07/26/24, 2:01 PM

## 2024-07-26 NOTE — Progress Notes (Signed)
 PT Cancellation Note  Patient Details Name: [name] MRN: [id number] DOB: 12-10-1950   Cancelled Treatment:     Pt febrile this afternoon with increased RR and HR. ? Sepsis currently per nursing. Will hold PT session and re-attempt as appropriate per POC.   Darice JAYSON Bohr 07/26/2024, 4:49 PM

## 2024-07-26 NOTE — Progress Notes (Signed)
 Progress Note   Patient: [name] FMW:[id number] DOB: Apr 19, 1951 DOA: 07/24/2024     1 DOS: the patient was seen and examined on 07/26/2024   Brief hospital course: 73 y.o. male with medical history significant of metastatic prostate cancer, type 2 diabetes mellitus, arthritis, hypertension and hyperlipidemia.  He presents to the hospital with low sugar.  He states his sister found him on the floor.  His sugar was 36.  Feels a little weak.  He states that he has been taking around 20 units of Lantus  and supposed to take 10.  He has also been taking 12 units of Humalog and was supposed to take 4.  He states he cannot see very well.  He draws up his own insulin  and injects his own insulin .  When he has a low sugar he feels out of it.  In the ER his sugar was in the 50s and 60s.  Hospitalist services contacted for further evaluation.  Patient also states he has an ulcer of his right foot that has been draining a little bit.  No fever chills or sweats.  He finished up radiation treatment for metastatic prostate cancer.  Follows with Dr. Ashley for his foot.  Patient put on D5W and IV antibiotics.  11/24.  Patient taken off D5W drip and will watch sugars another day.  Concerned about his vision and drawing up insulin .  Wondering if I can do pills instead.  MRI right foot did not show osteomyelitis.  Podiatry saw patient and recommended outpatient follow-up. 11/25.  Physical therapy recommending rehab from yesterday.  Case discussed with diabetes coordinator and will do Tradjenta  and Glucophage  and get rid of insulin .  Podiatry was okay with how the wounds look.  Will need close follow-up as outpatient.  Assessment and Plan: * Hypoglycemia Likely secondary to taking too much insulin  and poor vision on drawing up his insulin .  Initially placed on D5 drip.  Taken off D5 drip on 11/24.  Case discussed with diabetes coordinator will try Tradjenta  and Glucophage  to maintain sugars.  Discontinue  insulin .  Diabetic foot infection [facility]) Appreciate podiatry consultation.  MRI does not show any osteomyelitis.  Change over to oral doxycycline .  Will need close follow-up as outpatient.  Decubitus ulcer of left heel, unstageable (HCC) heel protector.  Hypotension Blood pressure improved from admission.  Hold antihypertensive medication.  Prostate cancer metastatic to multiple sites [facility]) Patient states he has finished up radiation therapy.  Patient on apalutamide  (holding here).  Hyperlipidemia, unspecified Continue Lipitor  Anemia of chronic disease Hemoglobin 8.2  Obesity, Class III, BMI 40-49.9 (morbid obesity) (HCC) BMI 40.31 with current height and weight in computer.  Leukopenia 1 dose of Granix  given.  Continue to monitor.  Pressure injury Wound 07/25/24 Pressure Injury Heel Left Unstageable - Full thickness tissue loss in which the base of the injury is covered by slough (yellow, tan, gray, green or brown) and/or eschar (tan, brown or black) in the wound bed. (Active)   Bilateral buttocks stage II with red moist macerated skin and patchy areas of partial-thickness loss consistent with moisture associated skin damage.  Both present on admission.        Subjective: Patient was weak with physical therapy yesterday.  Patient was hoping to go home.  Physical therapy recommending rehab.  Initially admitted with hypoglycemia.  Physical Exam: Vitals:   07/25/24 1532 07/25/24 2001 07/26/24 0408 07/26/24 0750  BP: (!) 96/42 (!) 103/45 (!) 97/50 (!) 105/50  Pulse: (!) 102 99  100 (!) 101  Resp: 20 20 18 20   Temp: 98.8 F (37.1 C) 98.3 F (36.8 C) 99.1 F (37.3 C) 98.8 F (37.1 C)  TempSrc: Oral Oral Oral Oral  SpO2: 98% 99% 96% 96%  Weight:      Height:       Physical Exam HENT:     Head: Normocephalic.  Eyes:     General: Lids are normal.  Cardiovascular:     Rate and Rhythm: Normal rate and regular rhythm.     Heart sounds: Normal heart sounds, S1 normal  and S2 normal.  Pulmonary:     Breath sounds: No decreased breath sounds, wheezing, rhonchi or rales.  Abdominal:     Palpations: Abdomen is soft.     Tenderness: There is no abdominal tenderness.  Musculoskeletal:     Right lower leg: Swelling present.     Left lower leg: Swelling present.  Skin:    General: Skin is warm.     Comments: Chronic lower extremity skin discoloration.  Wounds on the right foot look like they have scabbed over.  Soft area on the left heel.  Neurological:     Mental Status: He is alert and oriented to person, place, and time.     Data Reviewed: Creatinine 0.75, white blood cell count 1.1, hemoglobin 8.2, platelet count 286  Family Communication: Spoke with sister on the phone  Disposition: Status is: Inpatient Remains inpatient appropriate because: Physical therapy recommending rehab.  Took 2 people to get him up.  Planned Discharge Destination: Rehab    Time spent: 28 minutes  Author: Charlie Patterson, MD 07/26/2024 12:18 PM  For on call review [email or web address].

## 2024-07-26 NOTE — Inpatient Diabetes Management (Signed)
 Inpatient Diabetes Program Recommendations  AACE/ADA: New Consensus Statement on Inpatient Glycemic Control  Target Ranges:  Prepandial:   less than 140 mg/dL      Peak postprandial:   less than 180 mg/dL (1-2 hours)      Critically ill patients:  140 - 180 mg/dL    Latest Reference Range & Units 07/25/24 08:09 07/25/24 10:05 07/25/24 11:43 07/25/24 13:54 07/25/24 15:33 07/25/24 20:06 07/26/24 00:17 07/26/24 05:30 07/26/24 07:59  Glucose-Capillary 70 - 99 mg/dL 863 (H) 805 (H) 783 (H) 203 (H) 173 (H) 185 (H) 141 (H) 110 (H) 109 (H)   Review of Glycemic Control  Diabetes history: DM2 Outpatient Diabetes medications: Lantus  20 units daily, Humalog 12 units TID, Metformin  1000 mg BID Current orders for Inpatient glycemic control: Novolog  0-9 units Q4H   Inpatient Diabetes Program Recommendations:     Insulin : Initially hypoglycemic which has resolved and dextrose  in IV fluids stopped on 07/25/24. CBGs 109-185 mg/dl since ordering Novolog  correction insulin  on 07/25/24.  Oral DM: May want to consider ordering Tradjenta  5 mg daily.    Outpatient DM: Given patient's poor vision, concerned about patient self administration of insulin  outpatient. If patient needs insulin  outpatient, he will likely need to have family giving the insulin  to him.  At discharge, may want to consider stopping Lantus  and Humalog outpatient and continue Metformin  and add Tradjenta  5 mg daily.   Thanks, Earnie Gainer, RN, MSN, CDCES Diabetes Coordinator Inpatient Diabetes Program [phone] (Team Pager from 8am to 5pm)

## 2024-07-26 NOTE — TOC Progression Note (Signed)
 Transition of Care [facility]) - Progression Note    Patient Details  Name: [name] MRN: [id number] Date of Birth: 03-25-1951  [facility] CM/SW Contact  Dalia GORMAN Fuse, RN Phone Number: 07/26/2024, 4:01 PM  Clinical Narrative:     Therapy recs are for STR. TOC received message from the MD advsing that patient's sister provided choice. Her 1st choice is [employer] and her 2nd choice is [facility]. FL2 completed and sent out in [place]. TOC will continue to follow.                      Expected Discharge Plan and Services                                               Social Drivers of Health (SDOH) Interventions SDOH Screenings   Food Insecurity: No Food Insecurity (05/03/2024)  Housing: Low Risk  (05/03/2024)  Transportation Needs: No Transportation Needs (05/03/2024)  Utilities: Not At Risk (05/03/2024)  Depression (PHQ2-9): Low Risk  (07/07/2024)  Financial Resource Strain: Low Risk  (08/28/2023)   Received from [facility]  Recent Concern: Financial Resource Strain - Medium Risk (08/06/2023)   Received from [facility]  Physical Activity: Inactive (08/28/2023)   Received from [facility]  Social Connections: Moderately Integrated (04/17/2024)  Stress: No Stress Concern Present (08/28/2023)   Received from [facility]  Tobacco Use: Medium Risk (07/24/2024)  Health Literacy: Adequate Health Literacy (08/28/2023)   Received from [facility]    Readmission Risk Interventions     No data to display

## 2024-07-26 NOTE — [place] FL2 (Signed)
 [place]  MEDICAID [id number] LEVEL OF CARE FORM     IDENTIFICATION  Patient Name: [name] Birthdate: [birth year] Sex: male Admission Date (Current Location): 07/24/2024  [facility] [place] Number:  Chiropodist and Address:  [facility], [address] [place], [place] [address]      Provider Number: [id number]  Attending Physician Name and Address:  Josette Ade, MD  Relative Name and Phone Number:  Mannie Mom (Sister)  [phone]    Current Level of Care: Hospital Recommended Level of Care: Skilled Nursing Facility Prior Approval Number:    Date Approved/Denied:   PASRR Number: [id number]  Discharge Plan: SNF    Current Diagnoses: Patient Active Problem List   Diagnosis Date Noted   Pressure injury of buttock, stage 2 (HCC) 07/26/2024   Leukopenia 07/25/2024   Hypoglycemia 07/24/2024   Decubitus ulcer of left heel, unstageable (HCC) 07/24/2024   Hypotension 07/24/2024   Anemia of chronic disease 07/24/2024   Obesity, Class III, BMI 40-49.9 (morbid obesity) (HCC) 07/24/2024   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) 04/20/2024   B12 deficiency 04/19/2024   Hip fracture (HCC) 04/17/2024   Pathological fracture 04/17/2024   Secondary hypercoagulable state 04/17/2024   Hematoma 04/17/2024   Acute blood loss anemia 04/17/2024   Open wound of right foot 04/17/2024   CKD stage 3a, GFR 45-59 ml/min (HCC) 04/17/2024   Encounter for screening colonoscopy 08/20/2023   Polyp of transverse colon 08/20/2023   Polyp of ascending colon 08/20/2023   Diabetic infection of right foot (HCC) 02/19/2023   Atherosclerosis of native arteries of the extremities with ulceration (HCC) 07/14/2022   Diabetic foot infection (HCC) 01/03/2022   Hyperkalemia 01/03/2022   PAD (peripheral artery disease)    HTN (hypertension)    Type II diabetes mellitus with renal manifestations (HCC)    Acute renal failure superimposed on stage  3a chronic kidney disease (HCC)    Iron  deficiency anemia    Diabetes 1.5, 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   Lymphedema 01/11/2018   Chronic venous insufficiency 01/11/2018   Venous ulcer (HCC) 09/28/2017   Ulcer of foot with necrosis of bone (HCC) 10/30/2016   DM (diabetes mellitus), type 2 (HCC) 10/30/2016   Hyperlipidemia, unspecified 10/30/2016   Chronic kidney disease (CKD) stage G1/A3, glomerular filtration rate (GFR) equal to or greater than 90 mL/min/1.73 square meter and albuminuria creatinine ratio greater than 300 mg/g 10/27/2016   Pressure injury 10/04/2016   Septic shock (HCC) 10/03/2016   Erectile dysfunction 07/20/2014   Obesity 12/18/2010   Hypertension 02/19/2007    Orientation RESPIRATION BLADDER Height & Weight     Self, Time, Situation, Place    Continent Weight: 131.1 kg Height:  5' 11 (180.3 cm)  BEHAVIORAL SYMPTOMS/MOOD NEUROLOGICAL BOWEL NUTRITION STATUS      Continent Diet (Heart Healthy)  AMBULATORY STATUS COMMUNICATION OF NEEDS Skin   Extensive Assist Verbally                         Personal Care Assistance Level of Assistance  Bathing, Dressing Bathing Assistance: Maximum assistance   Dressing Assistance: Maximum assistance     Functional Limitations Info             SPECIAL CARE FACTORS FREQUENCY  PT (By licensed PT), OT (By licensed OT)     PT Frequency: 5 x week OT Frequency: 5 x week            Contractures  Additional Factors Info  Code Status, Allergies Code Status Info: FULL Allergies Info: Sulfa Antibiotics, Zosyn            Current Medications (07/26/2024):  This is the current hospital active medication list Current Facility-Administered Medications  Medication Dose Route Frequency Provider Last Rate Last Admin   acetaminophen  (TYLENOL ) tablet 650 mg  650 mg Oral Q6H PRN Josette Ade, MD   650 mg at 07/26/24 0225   Or   acetaminophen  (TYLENOL ) suppository 650 mg  650 mg Rectal Q6H PRN Josette Ade, MD       aspirin  EC tablet 81 mg  81 mg Oral Daily Josette Ade, MD   81 mg at 07/26/24 1041   atorvastatin  (LIPITOR) tablet 10 mg  10 mg Oral QPM Josette Ade, MD   10 mg at 07/25/24 1901   ceFEPIme  (MAXIPIME ) 2 g in sodium chloride  0.9 % 100 mL IVPB  2 g Intravenous Q8H Wieting, Richard, MD       collagenase  (SANTYL ) ointment   Topical Daily Josette Ade, MD   Given at 07/26/24 1041   doxycycline  (VIBRA -TABS) tablet 100 mg  100 mg Oral Q12H Wieting, Richard, MD       enoxaparin  (LOVENOX ) injection 65 mg  65 mg Subcutaneous Q24H Josette Ade, MD   65 mg at 07/25/24 2118   Gerhardt's butt cream   Topical BID Josette Ade, MD   Given at 07/26/24 1041   insulin  aspart (novoLOG ) injection 0-5 Units  0-5 Units Subcutaneous QHS Wieting, Richard, MD       insulin  aspart (novoLOG ) injection 0-6 Units  0-6 Units Subcutaneous TID WC Josette Ade, MD   1 Units at 07/26/24 1313   linagliptin  (TRADJENTA ) tablet 5 mg  5 mg Oral Daily Wieting, Richard, MD   5 mg at 07/26/24 1042   LORazepam  (ATIVAN ) tablet 1 mg  1 mg Oral Q8H Wieting, Richard, MD   1 mg at 07/26/24 1313   metFORMIN  (GLUCOPHAGE ) tablet 1,000 mg  1,000 mg Oral BID WC Wieting, Richard, MD       ondansetron  (ZOFRAN ) tablet 4 mg  4 mg Oral Q6H PRN Wieting, Richard, MD       Or   ondansetron  (ZOFRAN ) injection 4 mg  4 mg Intravenous Q6H PRN Wieting, Richard, MD       sodium chloride  0.9 % bolus 500 mL  500 mL Intravenous Once Josette Ade, MD         Discharge Medications: Please see discharge summary for a list of discharge medications.  Relevant Imaging Results:  Relevant Lab Results:   Additional Information SSN: [id number]  Dalia GORMAN Fuse, RN

## 2024-07-26 NOTE — Plan of Care (Signed)

## 2024-07-26 NOTE — Assessment & Plan Note (Addendum)
 Wound 07/25/24 Pressure Injury Heel Left Unstageable - Full thickness tissue loss in which the base of the injury is covered by slough (yellow, tan, gray, green or brown) and/or eschar (tan, brown or black) in the wound bed. (Active)   Bilateral buttocks stage II with red moist macerated skin and patchy areas of partial-thickness loss consistent with moisture associated skin damage.  Both present on admission.

## 2024-07-27 DIAGNOSIS — L89302 Pressure ulcer of unspecified buttock, stage 2: Secondary | ICD-10-CM

## 2024-07-27 DIAGNOSIS — I959 Hypotension, unspecified: Secondary | ICD-10-CM

## 2024-07-27 DIAGNOSIS — E11628 Type 2 diabetes mellitus with other skin complications: Secondary | ICD-10-CM | POA: Diagnosis not present

## 2024-07-27 DIAGNOSIS — E162 Hypoglycemia, unspecified: Secondary | ICD-10-CM | POA: Diagnosis not present

## 2024-07-27 DIAGNOSIS — D709 Neutropenia, unspecified: Secondary | ICD-10-CM

## 2024-07-27 LAB — RESPIRATORY PANEL BY PCR

## 2024-07-27 LAB — RESP PANEL BY RT-PCR (RSV, FLU A&B, COVID)  RVPGX2
Influenza A by PCR: NEGATIVE
Influenza B by PCR: NEGATIVE
Resp Syncytial Virus by PCR: NEGATIVE
SARS Coronavirus 2 by RT PCR: NEGATIVE

## 2024-07-27 LAB — TYPE AND SCREEN
ABO/RH(D): AB POS
Antibody Screen: NEGATIVE

## 2024-07-27 LAB — CBC
HCT: 24.9 % — ABNORMAL LOW (ref 39.0–52.0)
Hemoglobin: 8.3 g/dL — ABNORMAL LOW (ref 13.0–17.0)
MCH: 28.9 pg (ref 26.0–34.0)
MCHC: 33.3 g/dL (ref 30.0–36.0)
MCV: 86.8 fL (ref 80.0–100.0)
Platelets: 302 K/uL (ref 150–400)
RBC: 2.87 MIL/uL — ABNORMAL LOW (ref 4.22–5.81)
RDW: 17.2 % — ABNORMAL HIGH (ref 11.5–15.5)
WBC: 0.9 K/uL — CL (ref 4.0–10.5)
nRBC: 0 % (ref 0.0–0.2)

## 2024-07-27 LAB — GLUCOSE, CAPILLARY
Glucose-Capillary: 148 mg/dL — ABNORMAL HIGH (ref 70–99)
Glucose-Capillary: 129 mg/dL — ABNORMAL HIGH (ref 70–99)
Glucose-Capillary: 97 mg/dL (ref 70–99)
Glucose-Capillary: 134 mg/dL — ABNORMAL HIGH (ref 70–99)

## 2024-07-27 LAB — HEMOGLOBIN A1C
Hgb A1c MFr Bld: 5.6 % (ref 4.8–5.6)
Mean Plasma Glucose: 114 mg/dL

## 2024-07-27 MED ORDER — FILGRASTIM-AAFI 300 MCG/0.5ML IJ SOSY
300.0000 ug | PREFILLED_SYRINGE | Freq: Once | INTRAMUSCULAR | Status: AC
  Administered 2024-07-27: 300 ug via SUBCUTANEOUS
  Filled 2024-07-27: qty 0.5

## 2024-07-27 NOTE — TOC Progression Note (Addendum)
 Transition of Care [facility]) - Progression Note    Patient Details  Name: [name] MRN: [id number] Date of Birth: [birth year]  Transition of Care [facility] CM/SW Contact  Dalia GORMAN Fuse, RN Phone Number: 07/27/2024, 10:03 AM  Clinical Narrative:    All bed offers are pending in the hub. TOC spoke with AC at [facility], the patient's first and second choice in that order, and asked them to review the FL2. TOC is awaiting f/u.  1549: multiple declines citing inability to meet patients care needs, some stated they are unable to care for his wounds. TOC sent secure message to [facility] with [facility] and [facility] with Damien at [facility] to see if they feel the patient is appropriate. 1513: Emily reviewed FL2 and clinical, patient is not SNF appropriate. Bed search extended to [place]  TOC will continue to follow.                    Expected Discharge Plan and Services                                               Social Drivers of Health (SDOH) Interventions SDOH Screenings   Food Insecurity: No Food Insecurity (05/03/2024)  Housing: Low Risk  (05/03/2024)  Transportation Needs: No Transportation Needs (05/03/2024)  Utilities: Not At Risk (05/03/2024)  Depression (PHQ2-9): Low Risk  (07/07/2024)  Financial Resource Strain: Low Risk  (08/28/2023)   Received from [facility]  Recent Concern: Financial Resource Strain - Medium Risk (08/06/2023)   Received from [facility]  Physical Activity: Inactive (08/28/2023)   Received from [facility]  Social Connections: Moderately Integrated (04/17/2024)  Stress: No Stress Concern Present (08/28/2023)   Received from [facility]  Tobacco Use: Medium Risk (07/24/2024)  Health Literacy: Adequate Health Literacy (08/28/2023)   Received from [facility]    Readmission Risk Interventions     No data to display

## 2024-07-27 NOTE — Progress Notes (Signed)
 Physical Therapy Treatment Patient Details Name: [name] MRN: [id number] DOB: 10/24/1950 Today's Date: 07/27/2024   History of Present Illness Pt is a 73 year old male admitted with hypoglycemia, diabetic foot infection, decubitus ulcer of L heel, hypotension     PMH significant for metastatic prostate cancer, type 2 diabetes mellitus, arthritis, hypertension and hyperlipidemia.    PT Comments  Pt received in bed, feeling slightly better than previous day, currently afebrile. Modified sessio due to pt requiring MaxA to transfer supine to sitting at EOB. Increased discomfort at B feet against gravity. Pt unable to safely stand due to weakness and +2 assist needed. Per Podiatry note on 11/24 Activity: As tolerated to the left lower extremity; primarily heel weightbearing for transfer to the right lower extremity.  Patient to have Prevalon heel offloading boots while in bed, especially to the left lower extremity.  Patient is aware. No Offloading boots seen in room, will message nursing. Pt left sitting EOB for lunch. Continue to recommend STR at d/c.     If plan is discharge home, recommend the following: Two people to help with walking and/or transfers;A lot of help with bathing/dressing/bathroom;Assist for transportation;Help with stairs or ramp for entrance   Can travel by private vehicle     No  Equipment Recommendations  Hospital bed    Recommendations for Other Services       Precautions / Restrictions Precautions Precautions: Fall Recall of Precautions/Restrictions: Intact Restrictions Weight Bearing Restrictions Per Provider Order: Yes RLE Weight Bearing Per Provider Order: Weight bearing as tolerated LLE Weight Bearing Per Provider Order: Weight bearing as tolerated Other Position/Activity Restrictions:  (Per Podiatry note on 11/24, pt is WBAT B LE, encourage R heel wt bearing)     Mobility  Bed Mobility Overal bed mobility: Needs Assistance Bed Mobility:  Supine to Sit     Supine to sit: Max assist, HOB elevated, Used rails     General bed mobility comments:  (Heavy assist to transfer to EOB, good static sitting balance. Pt set up for lunch with call bell)    Transfers                        Ambulation/Gait                   Stairs             Wheelchair Mobility     Tilt Bed    Modified Rankin (Stroke Patients Only)       Balance Overall balance assessment: Needs assistance, History of Falls Sitting-balance support: No upper extremity supported, Feet supported Sitting balance-Leahy Scale: Fair     Standing balance support: Bilateral upper extremity supported, During functional activity, Reliant on assistive device for balance Standing balance-Leahy Scale: Poor                              Communication Communication Communication: No apparent difficulties  Cognition Arousal: Alert Behavior During Therapy: WFL for tasks assessed/performed   PT - Cognitive impairments: No apparent impairments                       PT - Cognition Comments: Very pleasant and agreeable to session Following commands: Intact      Cueing Cueing Techniques: Verbal cues  Exercises Other Exercises Other Exercises:  (Pt educated on role of acute PT and benefits of STR at  d/c)    General Comments General comments (skin integrity, edema, etc.):  (Right foot dressing intact, Left heel with Mepilex in place)      Pertinent Vitals/Pain Pain Assessment Pain Assessment: Faces Faces Pain Scale: Hurts little more Pain Location: R LE Pain Descriptors / Indicators: Aching Pain Intervention(s): Monitored during session    Home Living                          Prior Function            PT Goals (current goals can now be found in the care plan section) Acute Rehab PT Goals Patient Stated Goal: to go home    Frequency    Min 2X/week      PT Plan      Co-evaluation               AM-PAC PT 6 Clicks Mobility   Outcome Measure  Help needed turning from your back to your side while in a flat bed without using bedrails?: A Lot Help needed moving from lying on your back to sitting on the side of a flat bed without using bedrails?: A Lot Help needed moving to and from a bed to a chair (including a wheelchair)?: Total Help needed standing up from a chair using your arms (e.g., wheelchair or bedside chair)?: Total Help needed to walk in hospital room?: Total Help needed climbing 3-5 steps with a railing? : Total 6 Click Score: 8    End of Session   Activity Tolerance: Patient tolerated treatment well Patient left: in bed;with call bell/phone within reach Nurse Communication: Mobility status PT Visit Diagnosis: Repeated falls (R29.6);Muscle weakness (generalized) (M62.81);History of falling (Z91.81);Difficulty in walking, not elsewhere classified (R26.2);Pain Pain - Right/Left: Right Pain - part of body: Ankle and joints of foot     Time: 1213-1230 PT Time Calculation (min) (ACUTE ONLY): 17 min  Charges:    $Therapeutic Activity: 8-22 mins PT General Charges $$ ACUTE PT VISIT: 1 Visit                    Darice Bohr, PTA  Darice JAYSON Bohr 07/27/2024, 1:16 PM

## 2024-07-27 NOTE — Inpatient Diabetes Management (Addendum)
 Inpatient Diabetes Program Recommendations  AACE/ADA: New Consensus Statement on Inpatient Glycemic Control   Target Ranges:  Prepandial:   less than 140 mg/dL      [facility] postprandial:   less than 180 mg/dL (1-2 hours)      Critically ill patients:  140 - 180 mg/dL    Latest Reference Range & Units 07/26/24 07:59 07/26/24 12:24 07/26/24 17:14 07/26/24 21:31 07/27/24 07:34  Glucose-Capillary 70 - 99 mg/dL 890 (H) 815 (H) 834 (H) 144 (H) 148 (H)   Review of Glycemic Control  Diabetes history: DM2 Outpatient Diabetes medications: Lantus  20 units daily, Humalog 12 units TID, Metformin  1000 mg BID Current orders for Inpatient glycemic control: Novolog  0-6 units TID with meals, Novolog  0-5 units at bedtime, Metformin  1000 mg BID, Tradjenta  5 mg daily   Inpatient Diabetes Program Recommendations:    Outpatient DM: Given patient's poor vision, hypoglycemia on admission, and concern about patient being able to self administer insulin  outpatient; at discharge would recommend to discontinue insulin  outpatient and continue Metformin  and add Tradjenta  5 mg daily.    NOTE: Signing off consult. Please reconsult inpatient diabetes coordinator if needed.  Thanks, Earnie Gainer, RN, MSN, CDCES Diabetes Coordinator Inpatient Diabetes Program [phone] (Team Pager from 8am to 5pm)

## 2024-07-27 NOTE — Progress Notes (Signed)
 Progress Note   Patient: [name] FMW:[id number] DOB: Sep 02, 1950 DOA: 07/24/2024     2 DOS: the patient was seen and examined on 07/27/2024   Brief hospital course: 73 y.o. male with medical history significant of metastatic prostate cancer, type 2 diabetes mellitus, arthritis, hypertension and hyperlipidemia.  He presents to the hospital with low sugar.  He states his sister found him on the floor.  His sugar was 36.  Feels a little weak.  He states that he has been taking around 20 units of Lantus  and supposed to take 10.  He has also been taking 12 units of Humalog and was supposed to take 4.  He states he cannot see very well.  He draws up his own insulin  and injects his own insulin .  When he has a low sugar he feels out of it.  In the ER his sugar was in the 50s and 60s.  Hospitalist services contacted for further evaluation.  Patient also states he has an ulcer of his right foot that has been draining a little bit.  No fever chills or sweats.  He finished up radiation treatment for metastatic prostate cancer.  Follows with Dr. Ashley for his foot.  Patient put on D5W and IV antibiotics.  11/24.  Patient taken off D5W drip and will watch sugars another day.  Concerned about his vision and drawing up insulin .  Wondering if I can do pills instead.  MRI right foot did not show osteomyelitis.  Podiatry saw patient and recommended outpatient follow-up. 11/25.  Physical therapy recommending rehab from yesterday.  Case discussed with diabetes coordinator and will do Tradjenta  and Glucophage  and get rid of insulin .  Podiatry was okay with how the wounds look.  Will need close follow-up as outpatient. 11/25 continued.  Patient spiked a fever of 100.7.  Treat as neutropenic fever.  Blood cultures, urine culture and chest x-ray ordered.  Maxipime  ordered.  Continue doxycycline .  11/26: Maximum temperature recorded of 102.7 over the past 24-hour, worsening leukopenia at 0.9-ordered another dose of  Granix .  Preliminary blood cultures negative, checking respiratory viral panel, negative COVID, influenza and RSV.  Continuing cefepime  for now  Assessment and Plan: * Hypoglycemia Likely secondary to taking too much insulin  and poor vision on drawing up his insulin .  Initially placed on D5 drip.  Taken off D5 drip on 11/24.  Case discussed with diabetes coordinator will try Tradjenta  and Glucophage  to maintain sugars.  Discontinue insulin .  Leukopenia Febrile illness with leukopenia. Worsening leukopenia, currently at 0.9. Patient developed fever and having some dry cough.  Infectious workup negative so far. - Checking RVP - Continue with cefepime  - Ordered another dose of Granix    Diabetic foot infection [facility]) Appreciate podiatry consultation.  MRI does not show any osteomyelitis.   Cultures with corynebacterium. Currently on cefepime  for concern of leukopenic febrile illness   Will need close follow-up as outpatient.  Decubitus ulcer of left heel, unstageable (HCC) heel protector.  Hypotension Blood pressure improved from admission.  Hold antihypertensive medication.  Prostate cancer metastatic to multiple sites [facility]) Patient states he has finished up radiation therapy.  Patient on apalutamide  (holding here).  Hyperlipidemia, unspecified Continue Lipitor  Anemia of chronic disease Hemoglobin 8.3  Obesity, Class III, BMI 40-49.9 (morbid obesity) (HCC) BMI 40.31 with current height and weight in computer.  Pressure injury Wound 07/25/24 Pressure Injury Heel Left Unstageable - Full thickness tissue loss in which the base of the injury is covered by slough (yellow, tan,  gray, green or brown) and/or eschar (tan, brown or black) in the wound bed. (Active)   Bilateral buttocks stage II with red moist macerated skin and patchy areas of partial-thickness loss consistent with moisture associated skin damage.  Both present on admission.      Subjective: Patient was seen and  examined today.  Having some dry cough.  No recent IV chemo.  Had another febrile episode overnight.  Physical Exam: Vitals:   07/27/24 0729 07/27/24 1129 07/27/24 1457 07/27/24 1529  BP: (!) 107/53 112/67 (!) 95/59 (!) 100/51  Pulse: 93 99 92 94  Resp: 18 16 17 20   Temp: 98.3 F (36.8 C) 98.6 F (37 C) 98.2 F (36.8 C) 97.8 F (36.6 C)  TempSrc: Oral Oral Oral Oral  SpO2: 97% 100% 100% 98%  Weight:      Height:       General.  Obese gentleman, in no acute distress. Pulmonary.  Lungs clear bilaterally, normal respiratory effort. CV.  Regular rate and rhythm, no JVD, rub or murmur. Abdomen.  Soft, nontender, nondistended, BS positive. CNS.  Alert and oriented .  No focal neurologic deficit. Extremities.  No edema, no cyanosis, pulses intact and symmetrical. Psychiatry.  Judgment and insight appears normal.    Data Reviewed: Prior data reviewed  Family Communication: Discussed with patient  Disposition: Status is: Inpatient Remains inpatient appropriate because: Physical therapy recommending rehab.  Took 2 people to get him up.  Planned Discharge Destination: Rehab  Time spent: 50 minutes  This record has been created using Conservation officer, historic buildings. Errors have been sought and corrected,but may not always be located. Such creation errors do not reflect on the standard of care.   Author: Amaryllis Dare, MD 07/27/2024 5:27 PM  For on call review [email or web address].

## 2024-07-28 DIAGNOSIS — I959 Hypotension, unspecified: Secondary | ICD-10-CM | POA: Diagnosis not present

## 2024-07-28 DIAGNOSIS — E162 Hypoglycemia, unspecified: Secondary | ICD-10-CM | POA: Diagnosis not present

## 2024-07-28 DIAGNOSIS — E11628 Type 2 diabetes mellitus with other skin complications: Secondary | ICD-10-CM | POA: Diagnosis not present

## 2024-07-28 DIAGNOSIS — D709 Neutropenia, unspecified: Secondary | ICD-10-CM | POA: Diagnosis not present

## 2024-07-28 LAB — GLUCOSE, CAPILLARY
Glucose-Capillary: 124 mg/dL — ABNORMAL HIGH (ref 70–99)
Glucose-Capillary: 143 mg/dL — ABNORMAL HIGH (ref 70–99)
Glucose-Capillary: 119 mg/dL — ABNORMAL HIGH (ref 70–99)
Glucose-Capillary: 191 mg/dL — ABNORMAL HIGH (ref 70–99)

## 2024-07-28 LAB — CBC WITH DIFFERENTIAL/PLATELET
Abs Immature Granulocytes: 0 K/uL (ref 0.00–0.07)
Basophils Absolute: 0 K/uL (ref 0.0–0.1)
Basophils Relative: 1 %
Eosinophils Absolute: 0.3 K/uL (ref 0.0–0.5)
Eosinophils Relative: 30 %
HCT: 27.1 % — ABNORMAL LOW (ref 39.0–52.0)
Hemoglobin: 8.5 g/dL — ABNORMAL LOW (ref 13.0–17.0)
Immature Granulocytes: 0 %
Lymphocytes Relative: 36 %
Lymphs Abs: 0.3 K/uL — ABNORMAL LOW (ref 0.7–4.0)
MCH: 28.5 pg (ref 26.0–34.0)
MCHC: 31.4 g/dL (ref 30.0–36.0)
MCV: 90.9 fL (ref 80.0–100.0)
Monocytes Absolute: 0.3 K/uL (ref 0.1–1.0)
Monocytes Relative: 32 %
Neutro Abs: 0 K/uL — CL (ref 1.7–7.7)
Neutrophils Relative %: 1 %
Platelets: 308 K/uL (ref 150–400)
RBC: 2.98 MIL/uL — ABNORMAL LOW (ref 4.22–5.81)
RDW: 17.7 % — ABNORMAL HIGH (ref 11.5–15.5)
Smear Review: NORMAL
WBC: 1 K/uL — CL (ref 4.0–10.5)
nRBC: 0 % (ref 0.0–0.2)

## 2024-07-28 LAB — RENAL FUNCTION PANEL
Albumin: 2.5 g/dL — ABNORMAL LOW (ref 3.5–5.0)
Anion gap: 8 (ref 5–15)
BUN: 13 mg/dL (ref 8–23)
CO2: 20 mmol/L — ABNORMAL LOW (ref 22–32)
Calcium: 7.6 mg/dL — ABNORMAL LOW (ref 8.9–10.3)
Chloride: 108 mmol/L (ref 98–111)
Creatinine, Ser: 0.76 mg/dL (ref 0.61–1.24)
GFR, Estimated: >60 mL/min (ref >60)
Glucose, Bld: 128 mg/dL — ABNORMAL HIGH (ref 70–99)
Phosphorus: 2.2 mg/dL — ABNORMAL LOW (ref 2.5–4.6)
Potassium: 4.4 mmol/L (ref 3.5–5.1)
Sodium: 136 mmol/L (ref 135–145)

## 2024-07-28 MED ORDER — FILGRASTIM-AAFI 480 MCG/0.8ML IJ SOSY
480.0000 ug | PREFILLED_SYRINGE | Freq: Once | INTRAMUSCULAR | Status: AC
  Administered 2024-07-28: 480 ug via SUBCUTANEOUS
  Filled 2024-07-28: qty 0.8

## 2024-07-28 MED ORDER — SODIUM PHOSPHATES 45 MMOLE/15ML IV SOLN
30.0000 mmol | Freq: Once | INTRAVENOUS | Status: AC
  Administered 2024-07-28: 30 mmol via INTRAVENOUS
  Filled 2024-07-28: qty 10

## 2024-07-28 NOTE — Progress Notes (Addendum)
 Progress Note   Patient: [name] FMW:[id number] DOB: [birth year] DOA: 07/24/2024     3 DOS: the patient was seen and examined on 07/28/2024   Brief hospital course: 73 y.o. male with medical history significant of metastatic prostate cancer, type 2 diabetes mellitus, arthritis, hypertension and hyperlipidemia.  He presents to the hospital with low sugar.  He states his sister found him on the floor.  His sugar was 36.  Feels a little weak.  He states that he has been taking around 20 units of Lantus  and supposed to take 10.  He has also been taking 12 units of Humalog and was supposed to take 4.  He states he cannot see very well.  He draws up his own insulin  and injects his own insulin .  When he has a low sugar he feels out of it.  In the ER his sugar was in the 50s and 60s.  Hospitalist services contacted for further evaluation.  Patient also states he has an ulcer of his right foot that has been draining a little bit.  No fever chills or sweats.  He finished up radiation treatment for metastatic prostate cancer.  Follows with Dr. Ashley for his foot.  Patient put on D5W and IV antibiotics.  11/24.  Patient taken off D5W drip and will watch sugars another day.  Concerned about his vision and drawing up insulin .  Wondering if I can do pills instead.  MRI right foot did not show osteomyelitis.  Podiatry saw patient and recommended outpatient follow-up. 11/25.  Physical therapy recommending rehab from yesterday.  Case discussed with diabetes coordinator and will do Tradjenta  and Glucophage  and get rid of insulin .  Podiatry was okay with how the wounds look.  Will need close follow-up as outpatient. 11/25 continued.  Patient spiked a fever of 100.7.  Treat as neutropenic fever.  Blood cultures, urine culture and chest x-ray ordered.  Maxipime  ordered.  Continue doxycycline .  11/26: Maximum temperature recorded of 102.7 over the past 24-hour, worsening leukopenia at 0.9-ordered another dose of  Granix .  Preliminary blood cultures negative, checking respiratory viral panel, negative COVID, influenza and RSV.  Continuing cefepime  for now.  11/27: Afebrile over the past 24-hour, WBC at 1.0 and neutrophils of 0.  Discussed with Dr. Jacobo from oncology, who advised to continue Granix  until neutrophil of 1000.  Another dose of 480 mg ordered today.  Mild hypophosphatemia which is being repleted.  Assessment and Plan: * Hypoglycemia Likely secondary to taking too much insulin  and poor vision on drawing up his insulin .  Initially placed on D5 drip.  Taken off D5 drip on 11/24.  Case discussed with diabetes coordinator will try Tradjenta  and Glucophage  to maintain sugars.  Discontinue insulin .  Leukopenia Febrile illness with leukopenia. WBC of 1 and neutrophil of 0 today.  No obvious infection found, RVP was negative Afebrile over the past 24 hours Case was discussed with oncology and they recommend continuation of Granix  until neutrophils are above 1000 - Continue with cefepime  - Ordered another dose of Granix    Diabetic foot infection [facility]) Appreciate podiatry consultation.  MRI does not show any osteomyelitis.   Cultures with corynebacterium. Currently on cefepime  for concern of leukopenic febrile illness   Will need close follow-up as outpatient.  Decubitus ulcer of left heel, unstageable (HCC) heel protector.  Hypotension Blood pressure improved from admission.  Hold antihypertensive medication.  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  Patient on  apalutamide  (holding here).  Hyperlipidemia, unspecified Continue Lipitor  Anemia of chronic disease Hemoglobin 8.3  Obesity, Class III, BMI 40-49.9 (morbid obesity) (HCC) BMI 40.31 with current height and weight in computer.  Pressure injury Wound 07/25/24 Pressure Injury Heel Left Unstageable - Full thickness tissue loss in which the base of the injury is  covered by slough (yellow, tan, gray, green or brown) and/or eschar (tan, brown or black) in the wound bed. (Active)   Bilateral buttocks stage II with red moist macerated skin and patchy areas of partial-thickness loss consistent with moisture associated skin damage.  Both present on admission.      Subjective: Patient was seen and examined today.  No new concern.  Physical Exam: Vitals:   07/27/24 1529 07/27/24 2011 07/28/24 0515 07/28/24 0728  BP: (!) 100/51 (!) 117/53 (!) 108/52 (!) 103/52  Pulse: 94 97 98 98  Resp: 20 17 18 16   Temp: 97.8 F (36.6 C) 98.7 F (37.1 C) 98.7 F (37.1 C) 97.9 F (36.6 C)  TempSrc: Oral   Oral  SpO2: 98% 98% 97% 96%  Weight:      Height:       General.  Obese gentleman, in no acute distress. Pulmonary.  Lungs clear bilaterally, normal respiratory effort. CV.  Regular rate and rhythm, no JVD, rub or murmur. Abdomen.  Soft, nontender, nondistended, BS positive. CNS.  Alert and oriented .  No focal neurologic deficit. Extremities.  No edema,  pulses intact and symmetrical. Psychiatry.  Judgment and insight appears normal.     Data Reviewed: Prior data reviewed  Family Communication: Discussed with sister on phone.  Disposition: Status is: Inpatient Remains inpatient appropriate because: Physical therapy recommending rehab.  Took 2 people to get him up.  Planned Discharge Destination: Rehab  Time spent: 50 minutes  This record has been created using Conservation officer, historic buildings. Errors have been sought and corrected,but may not always be located. Such creation errors do not reflect on the standard of care.   Author: Amaryllis Dare, MD 07/28/2024 4:42 PM  For on call review [email or web address].

## 2024-07-29 DIAGNOSIS — E162 Hypoglycemia, unspecified: Secondary | ICD-10-CM | POA: Diagnosis not present

## 2024-07-29 DIAGNOSIS — D709 Neutropenia, unspecified: Secondary | ICD-10-CM | POA: Diagnosis not present

## 2024-07-29 DIAGNOSIS — E11628 Type 2 diabetes mellitus with other skin complications: Secondary | ICD-10-CM | POA: Diagnosis not present

## 2024-07-29 DIAGNOSIS — I959 Hypotension, unspecified: Secondary | ICD-10-CM | POA: Diagnosis not present

## 2024-07-29 LAB — CBC WITH DIFFERENTIAL/PLATELET
Abs Immature Granulocytes: 0.01 K/uL (ref 0.00–0.07)
Basophils Absolute: 0 K/uL (ref 0.0–0.1)
Basophils Relative: 2 %
Eosinophils Absolute: 0.3 K/uL (ref 0.0–0.5)
Eosinophils Relative: 26 %
HCT: 24.7 % — ABNORMAL LOW (ref 39.0–52.0)
Hemoglobin: 8.2 g/dL — ABNORMAL LOW (ref 13.0–17.0)
Immature Granulocytes: 1 %
Lymphocytes Relative: 38 %
Lymphs Abs: 0.4 K/uL — ABNORMAL LOW (ref 0.7–4.0)
MCH: 28.8 pg (ref 26.0–34.0)
MCHC: 33.2 g/dL (ref 30.0–36.0)
MCV: 86.7 fL (ref 80.0–100.0)
Monocytes Absolute: 0.3 K/uL (ref 0.1–1.0)
Monocytes Relative: 33 %
Neutro Abs: 0 K/uL — CL (ref 1.7–7.7)
Neutrophils Relative %: 0 %
Platelets: 307 K/uL (ref 150–400)
RBC: 2.85 MIL/uL — ABNORMAL LOW (ref 4.22–5.81)
RDW: 17.6 % — ABNORMAL HIGH (ref 11.5–15.5)
Smear Review: NORMAL
WBC: 1 K/uL — CL (ref 4.0–10.5)
nRBC: 0 % (ref 0.0–0.2)

## 2024-07-29 LAB — RENAL FUNCTION PANEL
Albumin: 2.5 g/dL — ABNORMAL LOW (ref 3.5–5.0)
Anion gap: 7 (ref 5–15)
BUN: 11 mg/dL (ref 8–23)
CO2: 22 mmol/L (ref 22–32)
Calcium: 7.7 mg/dL — ABNORMAL LOW (ref 8.9–10.3)
Chloride: 108 mmol/L (ref 98–111)
Creatinine, Ser: 0.72 mg/dL (ref 0.61–1.24)
GFR, Estimated: >60 mL/min (ref >60)
Glucose, Bld: 116 mg/dL — ABNORMAL HIGH (ref 70–99)
Phosphorus: 2.4 mg/dL — ABNORMAL LOW (ref 2.5–4.6)
Potassium: 4.2 mmol/L (ref 3.5–5.1)
Sodium: 138 mmol/L (ref 135–145)

## 2024-07-29 LAB — GLUCOSE, CAPILLARY
Glucose-Capillary: 119 mg/dL — ABNORMAL HIGH (ref 70–99)
Glucose-Capillary: 142 mg/dL — ABNORMAL HIGH (ref 70–99)
Glucose-Capillary: 128 mg/dL — ABNORMAL HIGH (ref 70–99)
Glucose-Capillary: 114 mg/dL — ABNORMAL HIGH (ref 70–99)

## 2024-07-29 MED ORDER — FILGRASTIM-AAFI 480 MCG/0.8ML IJ SOSY
480.0000 ug | PREFILLED_SYRINGE | Freq: Once | INTRAMUSCULAR | Status: AC
  Administered 2024-07-29: 480 ug via SUBCUTANEOUS
  Filled 2024-07-29: qty 0.8

## 2024-07-29 MED ORDER — SODIUM PHOSPHATES 45 MMOLE/15ML IV SOLN
30.0000 mmol | Freq: Once | INTRAVENOUS | Status: DC
  Filled 2024-07-29: qty 10

## 2024-07-29 NOTE — TOC Progression Note (Signed)
 Transition of Care [facility]) - Progression Note    Patient Details  Name: [name] MRN: [id number] Date of Birth: Dec 01, 1950  Transition of Care [facility]) CM/SW Contact  Daved JONETTA Hamilton, RN Phone Number: 07/29/2024, 5:41 PM  Clinical Narrative:     TOC presented bed offers to patient. At the time of discussion with patient offers from [facility] and [facility] presented. Patient verbalized he would make a decision tomorrow. Discussed briefly with patient process of obtaining prior auth for insurance approval before can go to SNF and that this required him to provide [facility] with his choice from bed offers. Patient verbalized understanding however, continued to insist he will decide at a later time.   At the time of this documentation this CM notes bed offer from [facility]. Attempted to reach patient however, no answer on phone. This CM contacted patient's sister as she originally provided choice however, no offer or declined offer for those facilities. Spoke with In[facility]ente, introduced self and discussed 3 bed offers. Inocente requested [facility], advised they are an assisted living facility. Inocente requested [facility], advised that they are not always able to offer SNF beds to patients outside of their community and that at this time they do not have any available beds. After this discussion Inocente verbalized that it will be up to the patient which facility he chooses.   TOC will continue to follow for choice.                    Expected Discharge Plan and Services                                               Social Drivers of Health (SDOH) Interventions SDOH Screenings   Food Insecurity: No Food Insecurity (05/03/2024)  Housing: Low Risk  (05/03/2024)  Transportation Needs: No Transportation Needs (05/03/2024)  Utilities: Not At Risk (05/03/2024)  Depression (PHQ2-9): Low Risk  (07/07/2024)  Financial Resource Strain: Low Risk  (08/28/2023)   Received from [facility]  Recent Concern: Financial Resource Strain - Medium Risk (08/06/2023)   Received from [facility]  Physical Activity: Inactive (08/28/2023)   Received from [facility]  Social Connections: Moderately Integrated (04/17/2024)  Stress: No Stress Concern Present (08/28/2023)   Received from [facility]  Tobacco Use: Medium Risk (07/24/2024)  Health Literacy: Adequate Health Literacy (08/28/2023)   Received from [facility]    Readmission Risk Interventions     No data to display

## 2024-07-29 NOTE — Progress Notes (Signed)
 Physical Therapy Treatment Patient Details Name: [name] MRN: [id number] DOB: Nov 01, 1950 Today's Date: 07/29/2024   History of Present Illness Pt is a 73 year old male admitted with hypoglycemia, diabetic foot infection, decubitus ulcer of L heel, hypotension     PMH significant for metastatic prostate cancer, type 2 diabetes mellitus, arthritis, hypertension and hyperlipidemia.    PT Comments  Pt was long sitting in bed upon arrival. He agrees to session and remains pleasant. Author questions if pt truly understands full expect of his current medical conditions/and his abilities. Pt required max assist to achieve EOB sitting, then to later return to bed after EOB/OOB activity. Pt requires bed height to be be elevated and mod assist to stand 4 x to RW. He did take ~ 2 lateral steps each time prior to falling/poor eccentric controlled lowering into EOB short sitting. Pt remains very weak with poor overall activity tolerance. RN in room at conclusion of session. DC recs remain appropriate to maximize his independence and safety with all ADLs.    If plan is discharge home, recommend the following: A lot of help with walking and/or transfers;A lot of help with bathing/dressing/bathroom;Assistance with cooking/housework;Direct supervision/assist for medications management;Direct supervision/assist for financial management;Assist for transportation;Help with stairs or ramp for entrance;Supervision due to cognitive status     Equipment Recommendations  Other (comment) (Defer to next level of care)       Precautions / Restrictions Precautions Precautions: Fall Recall of Precautions/Restrictions: Intact Restrictions Weight Bearing Restrictions Per Provider Order: Yes RLE Weight Bearing Per Provider Order: Weight bearing as tolerated LLE Weight Bearing Per Provider Order: Weight bearing as tolerated Other Position/Activity Restrictions: heel wt bearing per poditry note     Mobility  Bed  Mobility Overal bed mobility: Needs Assistance Bed Mobility: Supine to Sit  Supine to sit: Max assist, Used rails, HOB elevated Sit to supine: Max assist General bed mobility comments: Max assist to progress to/froim EOB from supine position. Once seated EOB with BLE feet support on floor and BUE support, CGA-close supervision for sitting balance. did have LOB posteriorly 2 x in sitting    Transfers Overall transfer level: Needs assistance Equipment used: Rolling walker (2 wheels) Transfers: Sit to/from Stand Sit to Stand: Mod assist, From elevated surface  General transfer comment: pt stood from elevated bed height 4 x with mod assist + vcs for fwd wt shift and technique improvements    Ambulation/Gait Ambulation/Gait assistance: Mod assist Gait Distance (Feet): 2 Feet Assistive device: Rolling walker (2 wheels) Gait Pattern/deviations: Step-to pattern Gait velocity: decreased  General Gait Details: Pt stood 4 x EOB but 3 x took ~ 2 side steps shuffling towards [facility] before poor eccentric controled sitting/falling into bed. Pt severely limited by fatigue.    Balance Overall balance assessment: Needs assistance, History of Falls Sitting-balance support: No upper extremity supported, Feet supported Sitting balance-Leahy Scale: Fair     Standing balance support: Bilateral upper extremity supported, During functional activity, Reliant on assistive device for balance Standing balance-Leahy Scale: Poor       Communication Communication Communication: No apparent difficulties  Cognition Arousal: Alert Behavior During Therapy: WFL for tasks assessed/performed   PT - Cognitive impairments: No apparent impairments      PT - Cognition Comments: Very pleasant and agreeable to session however lacks awareness of his deficits and safety concerns Following commands: Intact      Cueing Cueing Techniques: Verbal cues, Tactile cues         Pertinent  Vitals/Pain Pain Assessment Pain  Assessment: No/denies pain Pain Score: 0-No pain     PT Goals (current goals can now be found in the care plan section) Acute Rehab PT Goals Patient Stated Goal: get to a better rehab this time Progress towards PT goals: Progressing toward goals    Frequency    Min 2X/week       AM-PAC PT 6 Clicks Mobility   Outcome Measure  Help needed turning from your back to your side while in a flat bed without using bedrails?: A Lot Help needed moving from lying on your back to sitting on the side of a flat bed without using bedrails?: A Lot Help needed moving to and from a bed to a chair (including a wheelchair)?: A Lot Help needed standing up from a chair using your arms (e.g., wheelchair or bedside chair)?: A Lot Help needed to walk in hospital room?: Total Help needed climbing 3-5 steps with a railing? : Total 6 Click Score: 10    End of Session   Activity Tolerance: Patient tolerated treatment well Patient left: in bed;with call bell/phone within reach;with bed alarm set;with nursing/sitter in room Nurse Communication: Mobility status PT Visit Diagnosis: Repeated falls (R29.6);Muscle weakness (generalized) (M62.81);History of falling (Z91.81);Difficulty in walking, not elsewhere classified (R26.2);Pain     Time: 8585-8562 PT Time Calculation (min) (ACUTE ONLY): 23 min  Charges:    $Therapeutic Activity: 23-37 mins PT General Charges $$ ACUTE PT VISIT: 1 Visit                     Rankin Essex PTA 07/29/24, 5:11 PM

## 2024-07-29 NOTE — Progress Notes (Signed)
 Progress Note   Patient: [name] FMW:[id number] DOB: 29-Jan-1951 DOA: 07/24/2024     4 DOS: the patient was seen and examined on 07/29/2024   Brief hospital course: 73 y.o. male with medical history significant of metastatic prostate cancer, type 2 diabetes mellitus, arthritis, hypertension and hyperlipidemia.  He presents to the hospital with low sugar.  He states his sister found him on the floor.  His sugar was 36.  Feels a little weak.  He states that he has been taking around 20 units of Lantus  and supposed to take 10.  He has also been taking 12 units of Humalog and was supposed to take 4.  He states he cannot see very well.  He draws up his own insulin  and injects his own insulin .  When he has a low sugar he feels out of it.  In the ER his sugar was in the 50s and 60s.  Hospitalist services contacted for further evaluation.  Patient also states he has an ulcer of his right foot that has been draining a little bit.  No fever chills or sweats.  He finished up radiation treatment for metastatic prostate cancer.  Follows with Dr. Ashley for his foot.  Patient put on D5W and IV antibiotics.  11/24.  Patient taken off D5W drip and will watch sugars another day.  Concerned about his vision and drawing up insulin .  Wondering if I can do pills instead.  MRI right foot did not show osteomyelitis.  Podiatry saw patient and recommended outpatient follow-up. 11/25.  Physical therapy recommending rehab from yesterday.  Case discussed with diabetes coordinator and will do Tradjenta  and Glucophage  and get rid of insulin .  Podiatry was okay with how the wounds look.  Will need close follow-up as outpatient. 11/25 continued.  Patient spiked a fever of 100.7.  Treat as neutropenic fever.  Blood cultures, urine culture and chest x-ray ordered.  Maxipime  ordered.  Continue doxycycline .  11/26: Maximum temperature recorded of 102.7 over the past 24-hour, worsening leukopenia at 0.9-ordered another dose of  Granix .  Preliminary blood cultures negative, checking respiratory viral panel, negative COVID, influenza and RSV.  Continuing cefepime  for now.  11/27: Afebrile over the past 24-hour, WBC at 1.0 and neutrophils of 0.  Discussed with Dr. Jacobo from oncology, who advised to continue Granix  until neutrophil of 1000.  Another dose of 480 mg ordered today.  Mild hypophosphatemia which is being repleted.  11/28: Remained afebrile.  WBC still at 1 with neutrophil of 0, continuing Granix , phosphorus improved to 2.4-giving another dose. Foot wound does not look infected.  Assessment and Plan: * Hypoglycemia Likely secondary to taking too much insulin  and poor vision on drawing up his insulin .  Initially placed on D5 drip.  Taken off D5 drip on 11/24.  Case discussed with diabetes coordinator will try Tradjenta  and Glucophage  to maintain sugars.  Discontinue insulin .  Leukopenia Febrile illness with leukopenia. WBC of 1 and neutrophil of 0 today.  No obvious infection found, RVP was negative Afebrile over the past 24 hours Case was discussed with oncology and they recommend continuation of Granix  until neutrophils are above 1000 - Continue with cefepime  - Ordered another dose of Granix    Diabetic foot infection [facility]) Appreciate podiatry consultation.  MRI does not show any osteomyelitis.   Cultures with corynebacterium. Currently on cefepime  for concern of leukopenic febrile illness   Will need close follow-up as outpatient.  Decubitus ulcer of left heel, unstageable (HCC) heel protector.  Hypotension Blood  pressure improved from admission.  Hold antihypertensive medication.  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  Patient on apalutamide  (holding here).  Hyperlipidemia, unspecified Continue Lipitor  Anemia of chronic disease Hemoglobin 8.3  Obesity, Class III, BMI 40-49.9 (morbid obesity) (HCC) BMI 40.31 with current  height and weight in computer.  Pressure injury Wound 07/25/24 Pressure Injury Heel Left Unstageable - Full thickness tissue loss in which the base of the injury is covered by slough (yellow, tan, gray, green or brown) and/or eschar (tan, brown or black) in the wound bed. (Active)   Bilateral buttocks stage II with red moist macerated skin and patchy areas of partial-thickness loss consistent with moisture associated skin damage.  Both present on admission.      Subjective: Patient was eating lunch when seen today.  No new concern.  Physical Exam: Vitals:   07/28/24 1647 07/28/24 2050 07/29/24 0533 07/29/24 0730  BP: 115/81 (!) 113/56 (!) 110/58 (!) 119/57  Pulse: 96 99 95 95  Resp: 18 18 16 16   Temp: 98.2 F (36.8 C) 98.8 F (37.1 C) 99 F (37.2 C) 98.7 F (37.1 C)  TempSrc:  Oral Oral   SpO2: 99% 98% 97% 100%  Weight:      Height:       General.  Morbidly obese gentleman, in no acute distress. Pulmonary.  Lungs clear bilaterally, normal respiratory effort. CV.  Regular rate and rhythm, no JVD, rub or murmur. Abdomen.  Soft, nontender, nondistended, BS positive. CNS.  Alert and oriented .  No focal neurologic deficit. Extremities.  No edema, right TMA, wound healing well Psychiatry.  Judgment and insight appears normal.      Data Reviewed: Prior data reviewed  Family Communication: Discussed with patient  Disposition: Status is: Inpatient Remains inpatient appropriate because: Physical therapy recommending rehab.  Took 2 people to get him up.  Planned Discharge Destination: Rehab  Time spent: 50 minutes  This record has been created using Conservation officer, historic buildings. Errors have been sought and corrected,but may not always be located. Such creation errors do not reflect on the standard of care.   Author: Amaryllis Dare, MD 07/29/2024 3:26 PM  For on call review [email or web address].

## 2024-07-30 DIAGNOSIS — I959 Hypotension, unspecified: Secondary | ICD-10-CM | POA: Diagnosis not present

## 2024-07-30 DIAGNOSIS — D709 Neutropenia, unspecified: Secondary | ICD-10-CM | POA: Diagnosis not present

## 2024-07-30 DIAGNOSIS — E162 Hypoglycemia, unspecified: Secondary | ICD-10-CM | POA: Diagnosis not present

## 2024-07-30 DIAGNOSIS — E11628 Type 2 diabetes mellitus with other skin complications: Secondary | ICD-10-CM | POA: Diagnosis not present

## 2024-07-30 LAB — CBC WITH DIFFERENTIAL/PLATELET
Abs Immature Granulocytes: 0 K/uL (ref 0.00–0.07)
Basophils Absolute: 0 K/uL (ref 0.0–0.1)
Basophils Relative: 1 %
Eosinophils Absolute: 0.2 K/uL (ref 0.0–0.5)
Eosinophils Relative: 21 %
HCT: 27 % — ABNORMAL LOW (ref 39.0–52.0)
Hemoglobin: 8.9 g/dL — ABNORMAL LOW (ref 13.0–17.0)
Immature Granulocytes: 0 %
Lymphocytes Relative: 48 %
Lymphs Abs: 0.4 K/uL — ABNORMAL LOW (ref 0.7–4.0)
MCH: 28.7 pg (ref 26.0–34.0)
MCHC: 33 g/dL (ref 30.0–36.0)
MCV: 87.1 fL (ref 80.0–100.0)
Monocytes Absolute: 0.3 K/uL (ref 0.1–1.0)
Monocytes Relative: 30 %
Neutro Abs: 0 K/uL — CL (ref 1.7–7.7)
Neutrophils Relative %: 0 %
Platelets: 314 K/uL (ref 150–400)
RBC: 3.1 MIL/uL — ABNORMAL LOW (ref 4.22–5.81)
RDW: 17.4 % — ABNORMAL HIGH (ref 11.5–15.5)
Smear Review: NORMAL
WBC: 0.9 K/uL — CL (ref 4.0–10.5)
nRBC: 0 % (ref 0.0–0.2)

## 2024-07-30 LAB — AEROBIC/ANAEROBIC CULTURE W GRAM STAIN (SURGICAL/DEEP WOUND): Gram Stain: NONE SEEN

## 2024-07-30 LAB — GLUCOSE, CAPILLARY
Glucose-Capillary: 114 mg/dL — ABNORMAL HIGH (ref 70–99)
Glucose-Capillary: 136 mg/dL — ABNORMAL HIGH (ref 70–99)
Glucose-Capillary: 125 mg/dL — ABNORMAL HIGH (ref 70–99)
Glucose-Capillary: 105 mg/dL — ABNORMAL HIGH (ref 70–99)

## 2024-07-30 MED ORDER — FILGRASTIM-AAFI 480 MCG/0.8ML IJ SOSY
480.0000 ug | PREFILLED_SYRINGE | Freq: Once | INTRAMUSCULAR | Status: AC
  Administered 2024-07-30: 480 ug via SUBCUTANEOUS
  Filled 2024-07-30 (×2): qty 0.8

## 2024-07-30 NOTE — Progress Notes (Signed)
 Progress Note   Patient: [name] FMW:[id number] DOB: 20-Dec-1950 DOA: 07/24/2024     5 DOS: the patient was seen and examined on 07/30/2024   Brief hospital course: 73 y.o. male with medical history significant of metastatic prostate cancer, type 2 diabetes mellitus, arthritis, hypertension and hyperlipidemia.  He presents to the hospital with low sugar.  He states his sister found him on the floor.  His sugar was 36.  Feels a little weak.  He states that he has been taking around 20 units of Lantus  and supposed to take 10.  He has also been taking 12 units of Humalog and was supposed to take 4.  He states he cannot see very well.  He draws up his own insulin  and injects his own insulin .  When he has a low sugar he feels out of it.  In the ER his sugar was in the 50s and 60s.  Hospitalist services contacted for further evaluation.  Patient also states he has an ulcer of his right foot that has been draining a little bit.  No fever chills or sweats.  He finished up radiation treatment for metastatic prostate cancer.  Follows with Dr. Ashley for his foot.  Patient put on D5W and IV antibiotics.  11/24.  Patient taken off D5W drip and will watch sugars another day.  Concerned about his vision and drawing up insulin .  Wondering if I can do pills instead.  MRI right foot did not show osteomyelitis.  Podiatry saw patient and recommended outpatient follow-up. 11/25.  Physical therapy recommending rehab from yesterday.  Case discussed with diabetes coordinator and will do Tradjenta  and Glucophage  and get rid of insulin .  Podiatry was okay with how the wounds look.  Will need close follow-up as outpatient. 11/25 continued.  Patient spiked a fever of 100.7.  Treat as neutropenic fever.  Blood cultures, urine culture and chest x-ray ordered.  Maxipime  ordered.  Continue doxycycline .  11/26: Maximum temperature recorded of 102.7 over the past 24-hour, worsening leukopenia at 0.9-ordered another dose of  Granix .  Preliminary blood cultures negative, checking respiratory viral panel, negative COVID, influenza and RSV.  Continuing cefepime  for now.  11/27: Afebrile over the past 24-hour, WBC at 1.0 and neutrophils of 0.  Discussed with Dr. Jacobo from oncology, who advised to continue Granix  until neutrophil of 1000.  Another dose of 480 mg ordered today.  Mild hypophosphatemia which is being repleted.  11/28: Remained afebrile.  WBC still at 1 with neutrophil of 0, continuing Granix , phosphorus improved to 2.4-giving another dose. Foot wound does not look infected.  11/29: Remained afebrile, WBC still at 0.9 with neutrophils of 0, case was discussed again with Dr. Jacobo who advised to discontinue antibiotics and continue with Granix , continue holding Erleada   Assessment and Plan: * Hypoglycemia Likely secondary to taking too much insulin  and poor vision on drawing up his insulin .  Initially placed on D5 drip.  Taken off D5 drip on 11/24.  Case discussed with diabetes coordinator will try Tradjenta  and Glucophage  to maintain sugars.  Discontinue insulin .  Leukopenia Febrile illness with leukopenia. WBC of 0.9 and neutrophil of 0 today.  No obvious infection found, RVP was negative Afebrile over the past 48 hours Case was discussed with oncology and they recommend continuation of Granix  until neutrophils are above 1000 -We will stop antibiotics to monitor. - Ordered another dose of Granix    Diabetic foot infection [facility]) Appreciate podiatry consultation.  MRI does not show any osteomyelitis.  Superficial wound cultures with corynebacterium.   Will need close follow-up as outpatient. Continue with wound care  Decubitus ulcer of left heel, unstageable (HCC) heel protector.  Hypotension Blood pressure improved from admission.  Hold antihypertensive medication.  Prostate cancer metastatic to multiple sites [facility]) Patient states he has finished up radiation therapy.  Patient on  apalutamide  (holding here).  Hyperlipidemia, unspecified Continue Lipitor  Anemia of chronic disease Hemoglobin 8.3  Obesity, Class III, BMI 40-49.9 (morbid obesity) (HCC) BMI 40.31 with current height and weight in computer.  Pressure injury Wound 07/25/24 Pressure Injury Heel Left Unstageable - Full thickness tissue loss in which the base of the injury is covered by slough (yellow, tan, gray, green or brown) and/or eschar (tan, brown or black) in the wound bed. (Active)   Bilateral buttocks stage II with red moist macerated skin and patchy areas of partial-thickness loss consistent with moisture associated skin damage.  Both present on admission.      Subjective: Patient was sitting comfortably in chair when seen today.  No new concern.  He wants to get out of the hospital.  Physical Exam: Vitals:   07/29/24 1622 07/29/24 1940 07/30/24 0505 07/30/24 0900  BP: (!) 124/55 (!) 106/50 (!) 107/56 (!) 107/58  Pulse: 94 91 94 95  Resp: 14 16 16 20   Temp: 98.6 F (37 C) 98.3 F (36.8 C) 99.6 F (37.6 C) 97.9 F (36.6 C)  TempSrc: Oral   Oral  SpO2: 98% 100% 94% 98%  Weight:      Height:       General. Obese gentleman, in no acute distress. Pulmonary.  Lungs clear bilaterally, normal respiratory effort. CV.  Regular rate and rhythm, no JVD, rub or murmur. Abdomen.  Soft, nontender, nondistended, BS positive. CNS.  Alert and oriented .  No focal neurologic deficit. Extremities.  No edema, right foot with bandage Psychiatry.  Judgment and insight appears normal.      Data Reviewed: Prior data reviewed  Family Communication: Discussed with nephew at bedside  Disposition: Status is: Inpatient Remains inpatient appropriate because: Physical therapy recommending rehab.  Took 2 people to get him up.  Planned Discharge Destination: Rehab  Time spent: 50 minutes  This record has been created using Conservation officer, historic buildings. Errors have been sought and corrected,but  may not always be located. Such creation errors do not reflect on the standard of care.   Author: Amaryllis Dare, MD 07/30/2024 2:37 PM  For on call review [email or web address].

## 2024-07-30 NOTE — Plan of Care (Signed)
  Problem: Clinical Measurements: Goal: Ability to maintain clinical measurements within normal limits will improve Outcome: Progressing   Problem: Safety: Goal: Ability to remain free from injury will improve Outcome: Progressing   Problem: Pain Managment: Goal: General experience of comfort will improve and/or be controlled Outcome: Progressing   Problem: Elimination: Goal: Will not experience complications related to bowel motility Outcome: Progressing   Problem: Coping: Goal: Level of anxiety will decrease Outcome: Progressing   Problem: Metabolic: Goal: Ability to maintain appropriate glucose levels will improve Outcome: Progressing

## 2024-07-30 NOTE — TOC Progression Note (Addendum)
 Transition of Care [facility]) - Progression Note    Patient Details  Name: [name] MRN: [id number] Date of Birth: 1951/04/26  Transition of Care [facility]) CM/SW Contact  Devanny Palecek L Prom[place], [place] Phone Number: 07/30/2024, 1:33 PM  Clinical Narrative:     CSW met with patient to review bed offers. CSW advised that a choice is needed. Patient wanted to add more SNF to the search and inquired about facilities that have not offered a bed. CSW advised of three offers [address], [facility] and [facility]   Patient stated that he did not want [facility]. CSW reminded patient of the facilities that have offered a bed. [facility] was not on the list. He declined [facility] and [facility]. He advised that he will accept the bed at [facility]. CSW accepted the bed offer in the portal.   Insurance Auth will be started.                     Expected Discharge Plan and Services                                               Social Drivers of Health (SDOH) Interventions SDOH Screenings   Food Insecurity: No Food Insecurity (07/29/2024)  Housing: High Risk (07/29/2024)  Transportation Needs: No Transportation Needs (07/29/2024)  Utilities: Not At Risk (07/29/2024)  Depression (PHQ2-9): Low Risk  (07/07/2024)  Financial Resource Strain: Low Risk  (08/28/2023)   Received from [facility]  Recent Concern: Financial Resource Strain - Medium Risk (08/06/2023)   Received from [facility]  Physical Activity: Inactive (08/28/2023)   Received from [facility]  Social Connections: Moderately Integrated (07/29/2024)  Stress: No Stress Concern Present (08/28/2023)   Received from [facility]  Tobacco Use: Medium Risk (07/24/2024)  Health Literacy: Adequate Health Literacy (08/28/2023)   Received from [facility]    Readmission Risk Interventions     No data to display

## 2024-07-31 DIAGNOSIS — E162 Hypoglycemia, unspecified: Secondary | ICD-10-CM | POA: Diagnosis not present

## 2024-07-31 DIAGNOSIS — D709 Neutropenia, unspecified: Secondary | ICD-10-CM | POA: Diagnosis not present

## 2024-07-31 DIAGNOSIS — I959 Hypotension, unspecified: Secondary | ICD-10-CM | POA: Diagnosis not present

## 2024-07-31 DIAGNOSIS — E11628 Type 2 diabetes mellitus with other skin complications: Secondary | ICD-10-CM | POA: Diagnosis not present

## 2024-07-31 LAB — CULTURE, BLOOD (ROUTINE X 2)
Culture: NO GROWTH
Culture: NO GROWTH
Special Requests: ADEQUATE
Special Requests: ADEQUATE

## 2024-07-31 LAB — CBC WITH DIFFERENTIAL/PLATELET
Abs Immature Granulocytes: 0 K/uL (ref 0.00–0.07)
Basophils Absolute: 0 K/uL (ref 0.0–0.1)
Basophils Relative: 1 %
Eosinophils Absolute: 0.1 K/uL (ref 0.0–0.5)
Eosinophils Relative: 14 %
HCT: 25.2 % — ABNORMAL LOW (ref 39.0–52.0)
Hemoglobin: 8.5 g/dL — ABNORMAL LOW (ref 13.0–17.0)
Immature Granulocytes: 0 %
Lymphocytes Relative: 75 %
Lymphs Abs: 0.6 K/uL — ABNORMAL LOW (ref 0.7–4.0)
MCH: 29.1 pg (ref 26.0–34.0)
MCHC: 33.7 g/dL (ref 30.0–36.0)
MCV: 86.3 fL (ref 80.0–100.0)
Monocytes Absolute: 0.1 K/uL (ref 0.1–1.0)
Monocytes Relative: 9 %
Neutro Abs: 0 K/uL — CL (ref 1.7–7.7)
Neutrophils Relative %: 1 %
Platelets: 309 K/uL (ref 150–400)
RBC: 2.92 MIL/uL — ABNORMAL LOW (ref 4.22–5.81)
RDW: 17.3 % — ABNORMAL HIGH (ref 11.5–15.5)
Smear Review: NORMAL
WBC: 0.8 K/uL — CL (ref 4.0–10.5)
nRBC: 0 % (ref 0.0–0.2)

## 2024-07-31 LAB — GLUCOSE, CAPILLARY
Glucose-Capillary: 125 mg/dL — ABNORMAL HIGH (ref 70–99)
Glucose-Capillary: 133 mg/dL — ABNORMAL HIGH (ref 70–99)
Glucose-Capillary: 154 mg/dL — ABNORMAL HIGH (ref 70–99)
Glucose-Capillary: 120 mg/dL — ABNORMAL HIGH (ref 70–99)

## 2024-07-31 LAB — CREATININE, SERUM
Creatinine, Ser: 0.76 mg/dL (ref 0.61–1.24)
GFR, Estimated: >60 mL/min (ref >60)

## 2024-07-31 MED ORDER — LACTATED RINGERS IV BOLUS
500.0000 mL | Freq: Once | INTRAVENOUS | Status: AC
  Administered 2024-07-31: 500 mL via INTRAVENOUS

## 2024-07-31 MED ORDER — FILGRASTIM-AAFI 480 MCG/0.8ML IJ SOSY
480.0000 ug | PREFILLED_SYRINGE | Freq: Once | INTRAMUSCULAR | Status: AC
  Administered 2024-07-31: 480 ug via SUBCUTANEOUS
  Filled 2024-07-31: qty 0.8

## 2024-07-31 NOTE — Progress Notes (Signed)
 Progress Note   Patient: [name] FMW:[id number] DOB: Jan 17, 1951 DOA: 07/24/2024     6 DOS: the patient was seen and examined on 07/31/2024   Brief hospital course: 73 y.o. male with medical history significant of metastatic prostate cancer, type 2 diabetes mellitus, arthritis, hypertension and hyperlipidemia.  He presents to the hospital with low sugar.  He states his sister found him on the floor.  His sugar was 36.  Feels a little weak.  He states that he has been taking around 20 units of Lantus  and supposed to take 10.  He has also been taking 12 units of Humalog and was supposed to take 4.  He states he cannot see very well.  He draws up his own insulin  and injects his own insulin .  When he has a low sugar he feels out of it.  In the ER his sugar was in the 50s and 60s.  Hospitalist services contacted for further evaluation.  Patient also states he has an ulcer of his right foot that has been draining a little bit.  No fever chills or sweats.  He finished up radiation treatment for metastatic prostate cancer.  Follows with Dr. Ashley for his foot.  Patient put on D5W and IV antibiotics.  11/24.  Patient taken off D5W drip and will watch sugars another day.  Concerned about his vision and drawing up insulin .  Wondering if I can do pills instead.  MRI right foot did not show osteomyelitis.  Podiatry saw patient and recommended outpatient follow-up. 11/25.  Physical therapy recommending rehab from yesterday.  Case discussed with diabetes coordinator and will do Tradjenta  and Glucophage  and get rid of insulin .  Podiatry was okay with how the wounds look.  Will need close follow-up as outpatient. 11/25 continued.  Patient spiked a fever of 100.7.  Treat as neutropenic fever.  Blood cultures, urine culture and chest x-ray ordered.  Maxipime  ordered.  Continue doxycycline .  11/26: Maximum temperature recorded of 102.7 over the past 24-hour, worsening leukopenia at 0.9-ordered another dose of  Granix .  Preliminary blood cultures negative, checking respiratory viral panel, negative COVID, influenza and RSV.  Continuing cefepime  for now.  11/27: Afebrile over the past 24-hour, WBC at 1.0 and neutrophils of 0.  Discussed with Dr. Jacobo from oncology, who advised to continue Granix  until neutrophil of 1000.  Another dose of 480 mg ordered today.  Mild hypophosphatemia which is being repleted.  11/28: Remained afebrile.  WBC still at 1 with neutrophil of 0, continuing Granix , phosphorus improved to 2.4-giving another dose. Foot wound does not look infected.  11/29: Remained afebrile, WBC still at 0.9 with neutrophils of 0, case was discussed again with Dr. Jacobo who advised to discontinue antibiotics and continue with Granix , continue holding Erleada .  11/30: Remained afebrile and hemodynamically stable, slowly worsening leukopenia now WBC at 0.8, neutrophil remains 0.  Case was discussed again with Dr. Billi to continue with Granix  and he will discuss with his oncologist Dr. Rennie tomorrow as he might need bone marrow biopsy.  Assessment and Plan: * Hypoglycemia Likely secondary to taking too much insulin  and poor vision on drawing up his insulin .  Initially placed on D5 drip.  Taken off D5 drip on 11/24.  Case discussed with diabetes coordinator will try Tradjenta  and Glucophage  to maintain sugars.  Discontinue insulin .  Leukopenia Febrile illness with leukopenia. WBC of 0.8 and neutrophil of 0 today.  No obvious infection found, RVP was negative.  Remained afebrile  Case was  discussed with oncology again and they recommend continuation of Granix  until neutrophils are above 1000 -On-call oncologist will contact his own oncologist tomorrow and patient might need bone marrow biopsy as continue to have slight worsening of leukopenia despite getting Granix  for the past many days.  Antibiotics were discontinued yesterday - Ordered another dose of Granix  - Continue to  monitor CBC with differential   Diabetic foot infection [facility]) Appreciate podiatry consultation.  MRI does not show any osteomyelitis.   Superficial wound cultures with corynebacterium.   Will need close follow-up as outpatient. Continue with wound care  Decubitus ulcer of left heel, unstageable (HCC) heel protector.  Hypotension Blood pressure improved from admission.  Hold antihypertensive medication.  Prostate cancer metastatic to multiple sites [facility]) Patient states he has finished up radiation therapy.  Patient on apalutamide  (holding here).  Hyperlipidemia, unspecified Continue Lipitor  Anemia of chronic disease Hemoglobin 8.3  Obesity, Class III, BMI 40-49.9 (morbid obesity) (HCC) BMI 40.31 with current height and weight in computer.  Pressure injury Wound 07/25/24 Pressure Injury Heel Left Unstageable - Full thickness tissue loss in which the base of the injury is covered by slough (yellow, tan, gray, green or brown) and/or eschar (tan, brown or black) in the wound bed. (Active)   Bilateral buttocks stage II with red moist macerated skin and patchy areas of partial-thickness loss consistent with moisture associated skin damage.  Both present on admission.      Subjective: Patient was seen and examined today.  No new concern.  No new symptoms.  He was worried about his cell count and wants to go home.  Sister at bedside  Physical Exam: Vitals:   07/30/24 1617 07/30/24 1923 07/31/24 0357 07/31/24 0715  BP: (!) 105/59 (!) 106/51 (!) 127/57 (!) 110/59  Pulse: (!) 110 (!) 101 91 86  Resp: 20 17 20 16   Temp: 99.4 F (37.4 C) 98.4 F (36.9 C) 99.5 F (37.5 C) 98.2 F (36.8 C)  TempSrc: Oral Oral Oral   SpO2: 99% 98% 97% 96%  Weight:      Height:       General.  Obese gentleman, in no acute distress. Pulmonary.  Lungs clear bilaterally, normal respiratory effort. CV.  Regular rate and rhythm, no JVD, rub or murmur. Abdomen.  Soft, nontender, nondistended, BS  positive. CNS.  Alert and oriented .  No focal neurologic deficit. Extremities.  No edema, right foot with Ace wrap Psychiatry.  Judgment and insight appears normal.      Data Reviewed: Prior data reviewed  Family Communication: Discussed with sister at bedside  Disposition: Status is: Inpatient Remains inpatient appropriate because: Physical therapy recommending rehab.  Took 2 people to get him up.  Planned Discharge Destination: Rehab  Time spent: 50 minutes  This record has been created using Conservation officer, historic buildings. Errors have been sought and corrected,but may not always be located. Such creation errors do not reflect on the standard of care.   Author: Amaryllis Dare, MD 07/31/2024 3:23 PM  For on call review [email or web address].

## 2024-07-31 NOTE — Care Management Important Message (Signed)
 Important Message  Patient Details  Name: [name] MRN: [id number] Date of Birth: 05/02/1951   Important Message Given:  Yes - Medicare IM     Charlies Rayburn W, CMA 07/31/2024, 1:49 PM

## 2024-07-31 NOTE — Plan of Care (Signed)

## 2024-07-31 NOTE — Progress Notes (Signed)
 Mobility Specialist - Progress Note     07/31/24 1100  Mobility  Activity Ambulated with assistance;Stood at bedside;Pivoted/transferred from bed to chair  Level of Assistance +2 (takes two people)  Press Photographer wheel walker  Distance Ambulated (ft) 3 ft  Range of Motion/Exercises Active  Activity Response Tolerated well  Mobility Referral Yes  Mobility visit 1 Mobility   Pt resting in bed on RA upon entry. Pt STS and transferred to bed +2 Assist with RW. Pt relies heavily on left side to support right foot. Pt requires heavy =2 assitance to prevent fall. Pt left in recliner with needs in reach. Chair alarm activated.   Guido Rumble Mobility Specialist 07/31/24, 12:18 PM

## 2024-07-31 NOTE — Plan of Care (Signed)
  Problem: Clinical Measurements: Goal: Ability to maintain clinical measurements within normal limits will improve Outcome: Progressing   Problem: Elimination: Goal: Will not experience complications related to bowel motility Outcome: Progressing   Problem: Coping: Goal: Level of anxiety will decrease Outcome: Progressing   Problem: Safety: Goal: Ability to remain free from injury will improve Outcome: Progressing   Problem: Skin Integrity: Goal: Risk for impaired skin integrity will decrease Outcome: Progressing   Problem: Metabolic: Goal: Ability to maintain appropriate glucose levels will improve Outcome: Progressing   Problem: Fluid Volume: Goal: Ability to maintain a balanced intake and output will improve Outcome: Progressing

## 2024-08-01 ENCOUNTER — Ambulatory Visit: Admitting: Radiation Oncology

## 2024-08-01 DIAGNOSIS — D709 Neutropenia, unspecified: Secondary | ICD-10-CM | POA: Diagnosis not present

## 2024-08-01 DIAGNOSIS — E11628 Type 2 diabetes mellitus with other skin complications: Secondary | ICD-10-CM | POA: Diagnosis not present

## 2024-08-01 DIAGNOSIS — I959 Hypotension, unspecified: Secondary | ICD-10-CM | POA: Diagnosis not present

## 2024-08-01 DIAGNOSIS — E162 Hypoglycemia, unspecified: Secondary | ICD-10-CM | POA: Diagnosis not present

## 2024-08-01 LAB — CBC WITH DIFFERENTIAL/PLATELET
Abs Immature Granulocytes: 0.01 K/uL (ref 0.00–0.07)
Basophils Absolute: 0 K/uL (ref 0.0–0.1)
Basophils Relative: 1 %
Eosinophils Absolute: 0.1 K/uL (ref 0.0–0.5)
Eosinophils Relative: 7 %
HCT: 27 % — ABNORMAL LOW (ref 39.0–52.0)
Hemoglobin: 8.8 g/dL — ABNORMAL LOW (ref 13.0–17.0)
Immature Granulocytes: 1 %
Lymphocytes Relative: 54 %
Lymphs Abs: 0.4 K/uL — ABNORMAL LOW (ref 0.7–4.0)
MCH: 28.3 pg (ref 26.0–34.0)
MCHC: 32.6 g/dL (ref 30.0–36.0)
MCV: 86.8 fL (ref 80.0–100.0)
Monocytes Absolute: 0.3 K/uL (ref 0.1–1.0)
Monocytes Relative: 36 %
Neutro Abs: 0 K/uL — CL (ref 1.7–7.7)
Neutrophils Relative %: 1 %
Platelets: 315 K/uL (ref 150–400)
RBC: 3.11 MIL/uL — ABNORMAL LOW (ref 4.22–5.81)
RDW: 17.5 % — ABNORMAL HIGH (ref 11.5–15.5)
Smear Review: NORMAL
WBC: 0.8 K/uL — CL (ref 4.0–10.5)
nRBC: 0 % (ref 0.0–0.2)

## 2024-08-01 LAB — GLUCOSE, CAPILLARY
Glucose-Capillary: 128 mg/dL — ABNORMAL HIGH (ref 70–99)
Glucose-Capillary: 139 mg/dL — ABNORMAL HIGH (ref 70–99)
Glucose-Capillary: 144 mg/dL — ABNORMAL HIGH (ref 70–99)
Glucose-Capillary: 101 mg/dL — ABNORMAL HIGH (ref 70–99)

## 2024-08-01 LAB — RENAL FUNCTION PANEL
Albumin: 2.8 g/dL — ABNORMAL LOW (ref 3.5–5.0)
Anion gap: 10 (ref 5–15)
BUN: 11 mg/dL (ref 8–23)
CO2: 22 mmol/L (ref 22–32)
Calcium: 8.1 mg/dL — ABNORMAL LOW (ref 8.9–10.3)
Chloride: 103 mmol/L (ref 98–111)
Creatinine, Ser: 0.73 mg/dL (ref 0.61–1.24)
GFR, Estimated: >60 mL/min (ref >60)
Glucose, Bld: 117 mg/dL — ABNORMAL HIGH (ref 70–99)
Phosphorus: 2 mg/dL — ABNORMAL LOW (ref 2.5–4.6)
Potassium: 4.5 mmol/L (ref 3.5–5.1)
Sodium: 135 mmol/L (ref 135–145)

## 2024-08-01 MED ORDER — SODIUM PHOSPHATES 45 MMOLE/15ML IV SOLN
30.0000 mmol | Freq: Once | INTRAVENOUS | Status: AC
  Administered 2024-08-01: 30 mmol via INTRAVENOUS
  Filled 2024-08-01: qty 10

## 2024-08-01 MED ORDER — FILGRASTIM-AAFI 480 MCG/0.8ML IJ SOSY
480.0000 ug | PREFILLED_SYRINGE | Freq: Once | INTRAMUSCULAR | Status: AC
  Administered 2024-08-01: 480 ug via SUBCUTANEOUS
  Filled 2024-08-01: qty 0.8

## 2024-08-01 NOTE — Consult Note (Signed)
 Chief Complaint:  Severe Neutropenia  Procedure: Bone Marrow Biopsy with Aspiration   Referring Provider(s): Dr. KANDICE Joe  Supervising Physician: Johann Sieving  Patient Status: [facility] - In-pt  History of Present Illness: [name] is a 73 y.o. male with a history of T2DM, peripheral vascular disease, CKD III, and recent diagnosis of metastatic prostate cancer s/p palliative radiation treatment in October. He is known to IR from previous bone biopsy on 8/20 with Dr. VEAR Lent. Patient presented to the ED on 11/23 due symptomatic hypoglycemia. He was doing well until 11/25 when patient was noted to have a low grade fever. Blood cultures were ordered and have remained without growth for 5 days. He has been noted to have a decreasing WBC count; however, on 11/27 they returned at 1.0 with Neutrophils at 0. Dr. Jacobo was consulted who advised continuing Granix . This was done for multiple days, but patient continues to have worsening WBC count, now down to 0.8 with Neutrophils continuing to be at 0. Case discussed with Dr. Joe and IR consulted for possible bone marrow biopsy.   Patient is sitting upright in bedside chair in no acute distress. States that in the days leading up to coming to the hospital he felt weak and unsteady on his feet, but denies any dizziness/lightheadedness, palpitations, fevers/chills. He reports minimal appetite, but is otherwise without complaints. Bone marrow biopsy discussed with the patient at the bedside who is agreeable to move forward with the procedure on 12/2. All questions and concerns answered at the bedside.   Patient is Full Code  Past Medical History:  Diagnosis Date   Arthritis    Bladder incontinence    Chronic kidney disease    Stage 3 Kidney disease   Diabetes mellitus without complication (HCC)    DVT of leg (deep venous thrombosis) (HCC)    H/O RIGHT LEG 2018   Dyspnea    occassional   Hyperlipidemia    Hypertension     Peripheral vascular disease    Sepsis (HCC)     Past Surgical History:  Procedure Laterality Date   ABDOMINAL AORTOGRAM W/LOWER EXTREMITY N/A 10/10/2016   Procedure: Abdominal Aortogram w/Lower Extremity;  Surgeon: Cordella KANDICE Shawl, MD;  Location: [facility] INVASIVE CV [facility];  Service: Cardiovascular;  Laterality: N/A;   CATARACT EXTRACTION W/PHACO Left 05/27/2018   Procedure: CATARACT EXTRACTION PHACO AND INTRAOCULAR LENS PLACEMENT (IOC);  Surgeon: Myrna Adine Anes, MD;  Location: [facility] ORS;  Service: Ophthalmology;  Laterality: Left;  US  03:06.2 AP% 16.1 CDE 30.16 FLUID PACK LOT @ [id number] H   CATARACT EXTRACTION W/PHACO Right 07/13/2023   Procedure: CATARACT EXTRACTION PHACO AND INTRAOCULAR LENS PLACEMENT (IOC) RIGHT DIABETIC 4.00 00:36.6;  Surgeon: Myrna Adine Anes, MD;  Location: [facility];  Service: Ophthalmology;  Laterality: Right;   CIRCUMCISION  1998   COLONOSCOPY WITH PROPOFOL  N/A 08/20/2023   Procedure: COLONOSCOPY WITH PROPOFOL ;  Surgeon: Unk Corinn Skiff, MD;  Location: [facility] ENDOSCOPY;  Service: Gastroenterology;  Laterality: N/A;   EYE SURGERY Left    Retina Detachment   GRAFT APPLICATION Right 05/29/2017   Procedure: GRAFT APPLICATION ( RIGHT FOOT );  Surgeon: Shawl Cordella KANDICE, MD;  Location: [facility] ORS;  Service: Vascular;  Laterality: Right;  graft taken from patients right thigh   IRRIGATION AND DEBRIDEMENT FOOT Right 10/03/2016   Procedure: IRRIGATION AND DEBRIDEMENT FOOT;  Surgeon: Eva Gay, DPM;  Location: [facility] ORS;  Service: Podiatry;  Laterality: Right;   LOWER EXTREMITY ANGIOGRAPHY Right 02/24/2023   Procedure:  Lower Extremity Angiography;  Surgeon: Jama Cordella MATSU, MD;  Location: [facility] INVASIVE CV LAB;  Service: Cardiovascular;  Laterality: Right;   LOWER EXTREMITY INTERVENTION  10/10/2016   Procedure: Lower Extremity Intervention;  Surgeon: Cordella MATSU Jama, MD;  Location: [facility];  Service: Cardiovascular;;   PERIPHERAL VASCULAR BALLOON  ANGIOPLASTY Left 10/10/2016   Procedure: Peripheral Vascular Balloon Angioplasty;  Surgeon: Cordella MATSU Jama, MD;  Location: [facility];  Service: Cardiovascular;  Laterality: Left;   TONSILLECTOMY     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/2022   Procedure: TRANSMETATARSAL AMPUTATION;  Surgeon: Ashley Soulier, DPM;  Location: [facility] ORS;  Service: Podiatry;  Laterality: Right;   WOUND DEBRIDEMENT Right 11/07/2016   Procedure: DEBRIDEMENT OF WOUND AND BONE RIGHT FOOT AND APPLY WOUND VAC;  Surgeon: Soulier Ashley, DPM;  Location: [facility] ORS;  Service: Podiatry;  Laterality: Right;    Allergies: Sulfa antibiotics and Piperacillin -tazobactam in dex  Medications: Prior to Admission medications   Medication Sig Start Date End Date Taking? Authorizing Provider  apalutamide  (ERLEADA ) 60 MG tablet Take 4 tablets (240 mg total) by mouth daily. 07/06/24  Yes Brahmanday, Govinda R, MD  aspirin  EC 81 MG tablet Take 81 mg by mouth daily. Swallow whole.   Yes [provider]  atorvastatin  (LIPITOR) 10 MG tablet Take 10 mg by mouth every evening. 06/12/20  Yes [provider]  insulin  glargine (LANTUS ) 100 UNIT/ML injection Inject 0.15 mLs (15 Units total) into the skin at bedtime. 10/13/16  Yes Sherial Bail, MD  insulin  lispro (HUMALOG) 100 UNIT/ML injection Inject 4 Units into the skin See admin instructions. Inject 4 units SQ with breakfast, inject 4 units SQ with lunch and inject 4 units SQ with dinner   Yes [provider]  lisinopril  (ZESTRIL ) 10 MG tablet Take 10 mg by mouth daily. 06/16/24  Yes [provider]  LORazepam  (ATIVAN ) 1 MG tablet Take 1 mg by mouth every 8 (eight) hours.   Yes [provider]  metFORMIN  (GLUCOPHAGE ) 1000 MG tablet Take 1,000 mg by mouth 2 (two) times daily with a meal. 06/12/20  Yes [provider]  ACCU-CHEK GUIDE test strip Use 1 strip via meter four times a day  to monitor blood glucose 07/10/20   [provider]  Accu-Chek Softclix Lancets lancets  04/29/20   [provider]  DROPLET INSULIN  SYRINGE 31G X 5/16 0.3 ML MISC USE FIVE TIMES DAILY AS DIRECTED 06/12/20   [provider]  glucose blood (ACCU-CHEK AVIVA PLUS) test strip  04/29/20   [provider]  Insulin  Syringe-Needle U-100 31G X 5/16 0.3 ML MISC  06/05/20   [provider]  Wound Cleansers (VASHE WOUND THERAPY EX) Apply 1 Application topically daily after breakfast.    [provider]     Family History  Problem Relation Age of Onset   Diabetes Mother    Hypertension Mother    Diabetes Father    Hypertension Father    Cancer Father     Social History   Socioeconomic History   Marital status: Single    Spouse name: Not on file   Number of children: Not on file   Years of education: Not on file   Highest education level: Not on file  Occupational History   Not on file  Tobacco Use   Smoking status: Former    Types: Pipe, Cigarettes    Quit date: 05/23/1975    Years since quitting: 49.2   Smokeless tobacco: Never  Vaping Use   Vaping status: Never Used  Substance and Sexual Activity   Alcohol use: No   Drug use: No   Sexual activity: Not on file  Other Topics Concern   Not on file  Social History Narrative   Not on file   Social Drivers of Health   Financial Resource Strain: Low Risk  (08/28/2023)   Received from [facility]   Overall Financial Resource Strain (CARDIA)    Difficulty of Paying Living Expenses: Not hard at all  Recent Concern: Financial Resource Strain - Medium Risk (08/06/2023)   Received from [facility]   Overall Financial Resource Strain (CARDIA)    Difficulty of Paying Living Expenses: Somewhat hard  Food Insecurity: No Food Insecurity (07/29/2024)   Hunger Vital Sign    Worried About Running Out of Food in the Last Year: Never true    Ran Out of Food in the Last Year: Never true  Transportation  Needs: No Transportation Needs (07/29/2024)   PRAPARE - Administrator, Civil Service (Medical): No    Lack of Transportation (Non-Medical): No  Physical Activity: Inactive (08/28/2023)   Received from [facility]   Exercise Vital Sign    On average, how many days per week do you engage in moderate to strenuous exercise (like a brisk walk)?: 0 days    On average, how many minutes do you engage in exercise at this level?: 0 min  Stress: No Stress Concern Present (08/28/2023)   Received from [facility] - Occupational Stress Questionnaire    Feeling of Stress : Not at all  Social Connections: Moderately Integrated (07/29/2024)   Social Connection and Isolation Panel    Frequency of Communication with Friends and Family: Three times a week    Frequency of Social Gatherings with Friends and Family: Three times a week    Attends Religious Services: 1 to 4 times per year    Active Member of Clubs or Organizations: Yes    Attends Banker Meetings: 1 to 4 times per year    Marital Status: Never married    Review of Systems  Constitutional:  Positive for appetite change.  Neurological:  Positive for weakness.  Patient denies any headache, chest pain, shortness of breath, abdominal pain, N/V, or fever/chills. All other systems are negative.   Vital Signs: BP 95/60 (BP Location: Right Arm)   Pulse 94   Temp 98.1 F (36.7 C)   Resp 16   Ht 5' 11 (1.803 m)   Wt 289 lb 0.4 oz (131.1 kg)   SpO2 99%   BMI 40.31 kg/m    Physical Exam Vitals reviewed.  Constitutional:      Appearance: Normal appearance.  HENT:     Mouth/Throat:     Mouth: Mucous membranes are moist.     Pharynx: Oropharynx is clear.  Cardiovascular:     Rate and Rhythm: Normal rate.  Pulmonary:     Effort: Pulmonary effort is normal.  Abdominal:     General: Abdomen is flat.     Palpations: Abdomen is soft.      Tenderness: There is no abdominal tenderness.  Skin:    General: Skin is warm and dry.  Neurological:     Mental Status: He is alert and oriented to person, place, and time.  Psychiatric:        Behavior: Behavior normal.  Imaging: DG Chest Port 1 View Result Date: 07/26/2024 EXAM: 1 VIEW(S) XRAY OF THE CHEST 07/26/2024 04:23:00 PM COMPARISON: 07/24/2024 CLINICAL HISTORY: Neutropenic fever FINDINGS: LUNGS AND PLEURA: Low lung volumes. Stable left lung base atelectasis. No pleural effusion. No pneumothorax. HEART AND MEDIASTINUM: No acute abnormality of the cardiac and mediastinal silhouettes. BONES AND SOFT TISSUES: No acute osseous abnormality. IMPRESSION: 1. Stable left lung base atelectasis. Electronically signed by: Oneil Devonshire MD 07/26/2024 08:09 PM EST RP Workstation: [id number]   MR FOOT RIGHT W WO CONTRAST Result Date: 07/24/2024 CLINICAL DATA:  Right foot ulcer.  History of prior amputations. EXAM: MRI OF THE RIGHT FOREFOOT WITHOUT AND WITH CONTRAST TECHNIQUE: Multiplanar, multisequence MR imaging of the right foot was performed before and after the administration of intravenous contrast. CONTRAST:  10mL GADAVIST  GADOBUTROL  1 MMOL/ML IV SOLN COMPARISON:  Radiographs 07/24/2024 FINDINGS: There is an open wound noted over the lateral aspect of the foot near the fifth metatarsal base. Again demonstrated are prior mid metatarsal amputations. I do not see any findings suspicious for osteomyelitis or septic arthritis. No rim enhancing subcutaneous abscess. Stable appearing exuberant periosteal reaction involving the fourth metatarsal shaft. Diffuse subcutaneous soft tissue swelling/edema suggesting cellulitis. Diffuse myositis and advanced fatty atrophy of the foot musculature but no evidence of pyomyositis. The major tendons and ligaments appear intact. IMPRESSION: 1. Open wound over the lateral aspect of the foot near the fifth metatarsal base. No findings suspicious for osteomyelitis or  septic arthritis. 2. Diffuse subcutaneous soft tissue swelling/edema suggesting cellulitis. 3. Diffuse myositis and advanced fatty atrophy of the foot musculature but no evidence of pyomyositis. Electronically Signed   By: MYRTIS Stammer M.D.   On: 07/24/2024 20:10   CT Cervical Spine Wo Contrast Result Date: 07/24/2024 CLINICAL DATA:  Syncope, fell EXAM: CT CERVICAL SPINE WITHOUT CONTRAST TECHNIQUE: Multidetector CT imaging of the cervical spine was performed without intravenous contrast. Multiplanar CT image reconstructions were also generated. RADIATION DOSE REDUCTION: This exam was performed according to the departmental dose-optimization program which includes automated exposure control, adjustment of the mA and/or kV according to patient size and/or use of iterative reconstruction technique. COMPARISON:  06/01/2024 FINDINGS: Alignment: Slight reversal cervical lordosis likely due to multilevel spondylosis and facet hypertrophy. Otherwise alignment is anatomic. Skull base and vertebrae: Lytic bony metastases are noted within the spinous process of C2, within the body and right transverse process of C7, and within the T1 vertebral body, similar to recent PET scan. There are no acute displaced fractures. Soft tissues and spinal canal: No prevertebral fluid or swelling. No visible canal hematoma. Disc levels: Bony fusion across the C3-4 facet joints. There is diffuse multilevel spondylosis with disc space narrowing and prominent anterior osteophyte greatest from C4-5 through C7-T1. Upper chest: Mottled sclerosis of the visualized portions of the thoracic cage consistent with bony metastases. Airway is patent. Lung apices are clear. Other: Reconstructed images demonstrate no additional findings. IMPRESSION: 1. No acute cervical spine fracture. 2. Multifocal bony metastases, consistent with metastatic prostate cancer and not appreciably changed from prior PET scan. Electronically Signed   By: Ozell Daring M.D.    On: 07/24/2024 16:42   CT Head Wo Contrast Result Date: 07/24/2024 CLINICAL DATA:  Syncope, fell EXAM: CT HEAD WITHOUT CONTRAST TECHNIQUE: Contiguous axial images were obtained from the base of the skull through the vertex without intravenous contrast. RADIATION DOSE REDUCTION: This exam was performed according to the departmental dose-optimization program which includes automated exposure control, adjustment of the mA and/or kV according  to patient size and/or use of iterative reconstruction technique. COMPARISON:  None Available. FINDINGS: Brain: No acute infarct or hemorrhage. Lateral ventricles and midline structures are unremarkable. No acute extra-axial fluid collections. No mass effect. Vascular: No hyperdense vessel or unexpected calcification. Diffuse atherosclerosis. Skull: Normal. Negative for fracture or focal lesion. Sinuses/Orbits: No acute finding. Other: None. IMPRESSION: 1. No acute intracranial process. Electronically Signed   By: Ozell Daring M.D.   On: 07/24/2024 16:39   DG Chest Portable 1 View Result Date: 07/24/2024 EXAM: 1 VIEW(S) XRAY OF THE CHEST 07/24/2024 04:10:00 PM COMPARISON: Comparison with 01/03/2022. CLINICAL HISTORY: confusion FINDINGS: LUNGS AND PLEURA: Shallow inspiration with linear atelectasis in the lung bases. No airspace consolidation or edema. No pleural effusion. No pneumothorax. HEART AND MEDIASTINUM: Heart size and pulmonary vascularity are normal. No acute abnormality of the cardiac and mediastinal silhouettes. BONES AND SOFT TISSUES: Degenerative changes in the spine and shoulders. IMPRESSION: 1. No acute cardiopulmonary process identified. 2. Shallow inspiration with linear atelectasis in the lung bases. Electronically signed by: Elsie Gravely MD 07/24/2024 04:15 PM EST RP Workstation: [id number]   DG Foot 2 Views Right Result Date: 07/24/2024 CLINICAL DATA:  Right foot pain. EXAM: RIGHT FOOT - 2 VIEW COMPARISON:  None Available. FINDINGS: Status post  transmetatarsal amputation. No acute fracture or dislocation. The bones are osteopenic. Degenerative changes of the midfoot and spurring. Soft tissues thickening over the stump. No soft tissue gas. Vascular calcifications noted. IMPRESSION: 1. No acute fracture or dislocation. 2. Status post transmetatarsal amputation. Electronically Signed   By: Vanetta Chou M.D.   On: 07/24/2024 16:14   DG Foot 2 Views Left Result Date: 07/24/2024 CLINICAL DATA:  Left heel ulcer.  Evaluate for soft tissue gas. EXAM: LEFT FOOT - 2 VIEW COMPARISON:  Left foot radiograph dated 05/03/2021. FINDINGS: No acute fracture or dislocation. The bones are osteopenic. No bone erosion or periosteal elevation. Vascular calcifications noted. No soft tissue gas. IMPRESSION: 1. No acute fracture or dislocation. 2. Osteopenia. Electronically Signed   By: Vanetta Chou M.D.   On: 07/24/2024 16:13    Labs:  CBC: Recent Labs    07/29/24 0543 07/30/24 0359 07/31/24 0421 08/01/24 0429  WBC 1.0* 0.9* 0.8* 0.8*  HGB 8.2* 8.9* 8.5* 8.8*  HCT 24.7* 27.0* 25.2* 27.0*  PLT 307 314 309 315    COAGS: Recent Labs    04/17/24 0236 04/20/24 0532  INR 1.3* 1.2  APTT 35  --     BMP: Recent Labs    07/25/24 0520 07/26/24 0310 07/28/24 0509 07/29/24 0543 07/31/24 0421 08/01/24 0429  NA 136  --  136 138  --  135  K 4.4  --  4.4 4.2  --  4.5  CL 111  --  108 108  --  103  CO2 18*  --  20* 22  --  22  GLUCOSE 149*  --  128* 116*  --  117*  BUN 14  --  13 11  --  11  CALCIUM  7.9*  --  7.6* 7.7*  --  8.1*  CREATININE 0.70   < > 0.76 0.72 0.76 0.73  GFRNONAA >60   < > >60 >60 >60 >60   < > = values in this interval not displayed.    LIVER FUNCTION TESTS: Recent Labs    05/03/24 1331 05/31/24 0817 07/07/24 1319 07/24/24 1533 07/28/24 0509 07/29/24 0543 08/01/24 0429  BILITOT 0.8 0.6 0.5 0.2  --   --   --  AST 18 18 19  37  --   --   --   ALT 12 9 15 12   --   --   --   ALKPHOS 155* 139* 176* 133*  --   --    --   PROT 8.3* 7.4 7.7 6.5  --   --   --   ALBUMIN 3.5 3.1* 3.6 3.3* 2.5* 2.5* 2.8*    TUMOR MARKERS: No results for input(s): AFPTM, CEA, CA199, CHROMGRNA in the last 8760 hours.  Assessment and Plan:  Severe Neutropenia: DANTAVIOUS SNOWBALL is a 73 y.o. male with a history of T2DM, peripheral vascular disease, CKD III, and recent diagnosis of metastatic prostate cancer s/p palliative radiation treatment who presented to [facility] for management of symptomatic hypoglycemia. Patient was reportedly doing well until 11/25 when he was noted to have a fever in the setting of neutropenia. Oncology attempted to manage medically, but patient's counts continue to worsen. IR consulted for possible bone marrow biopsy to which patient is agreeable. Procedure to be performed under moderate sedation.  -NPO at midnight -WBC 0.8, neutrophils 0.0 today  -Patient remains on Granix  per Oncology -Plan for Bone marrow biopsy at 8am on 12/2 in IR with Dr. JONETTA Faes  Risks and benefits of bone marrow biopsy was discussed with the patient and/or patient's family including, but not limited to bleeding, infection, damage to adjacent structures or low yield requiring additional tests.  All of the questions were answered and there is agreement to proceed.  Consent signed and in IR.  Thank you for allowing our service to participate in [name] 's care.    Electronically Signed: Mikell Camp M Ahlayah Tarkowski, PA-C   08/01/2024, 3:06 PM     I spent a total of 20 Minutes in face to face in clinical consultation, greater than 50% of which was counseling/coordinating care for bone marrow biopsy with aspiration.

## 2024-08-01 NOTE — Consult Note (Signed)
  Cancer Center CONSULT NOTE  Patient Care Team: Adina Buel HERO, MD as PCP - General (Family Medicine) [name], MD as Consulting Physician (Oncology) Lenn Aran, MD as Consulting Physician (Radiation Oncology)  CHIEF COMPLAINTS/PURPOSE OF CONSULTATION: prostate cancer; neutropenia/anemia  Oncology History Overview Note  # AUG 2025- [hospital]- Metastatic prostate cancer-castrate sensitive de novo; high volume-awaiting r Tempus. Bx  IR:  METASTIC PROSTATE ADENOCARCINOMA  # on Firmagon  q 4 weeks. awaiting  radiatio oncology re: prostate cancer- mets to bone  #    Prostate cancer metastatic to multiple sites [facility])  04/20/2024 Initial Diagnosis   Prostate cancer metastatic to multiple sites [facility])   05/10/2024 Cancer Staging   Staging form: Prostate, AJCC 8th Edition - Clinical: Stage IVB (cTX, cNX, pM1b) - Signed by [name], MD on 05/10/2024     HISTORY OF PRESENTING ILLNESS: Patient in chair having lunch   Alone.   [name] 73 y.o.  male pleasant patient with multiple medical problems including prostate cancer metastatic to the bone-diabetes type 2 with labile blood sugars arthritis hypertension hyperlipidemia and also history of decubitus ulcer-is currently admitted to hospital for mental status changes/falls/secondary to hypoglycemia.  However-patient noted to have worsening leukopenia/neutropenia since admission-currently neutropenia ANC 0.0.  In spite of getting Granix  injections.  Patient denies any fevers or chills.  Denies any worsening back pain or joint pains.  No worsening bleeding.  However continues to complain of fatigue and feeling poorly.    Review of Systems  Constitutional:  Positive for malaise/fatigue and weight loss. Negative for chills, diaphoresis and fever.  HENT:  Negative for nosebleeds and sore throat.   Eyes:  Negative for double vision.  Respiratory:  Negative for cough, hemoptysis, sputum production,  shortness of breath and wheezing.   Cardiovascular:  Negative for chest pain, palpitations, orthopnea and leg swelling.  Gastrointestinal:  Negative for abdominal pain, blood in stool, constipation, diarrhea, heartburn, melena, nausea and vomiting.  Genitourinary:  Negative for dysuria, frequency and urgency.  Musculoskeletal:  Positive for back pain and joint pain.  Skin: Negative.  Negative for itching and rash.  Neurological:  Negative for dizziness, tingling, focal weakness, weakness and headaches.  Endo/Heme/Allergies:  Does not bruise/bleed easily.  Psychiatric/Behavioral:  Negative for depression. The patient is not nervous/anxious and does not have insomnia.     MEDICAL HISTORY:  Past Medical History:  Diagnosis Date   Arthritis    Bladder incontinence    Chronic kidney disease    Stage 3 Kidney disease   Diabetes mellitus without complication (HCC)    DVT of leg (deep venous thrombosis) (HCC)    H/O RIGHT LEG 2018   Dyspnea    occassional   Hyperlipidemia    Hypertension    Peripheral vascular disease    Sepsis (HCC)     SURGICAL HISTORY: Past Surgical History:  Procedure Laterality Date   ABDOMINAL AORTOGRAM W/LOWER EXTREMITY N/A 10/10/2016   Procedure: Abdominal Aortogram w/Lower Extremity;  Surgeon: Cordella KANDICE Shawl, MD;  Location: [facility];  Service: Cardiovascular;  Laterality: N/A;   CATARACT EXTRACTION W/PHACO Left 05/27/2018   Procedure: CATARACT EXTRACTION PHACO AND INTRAOCULAR LENS PLACEMENT (IOC);  Surgeon: Myrna Adine Anes, MD;  Location: [facility] ORS;  Service: Ophthalmology;  Laterality: Left;  US  03:06.2 AP% 16.1 CDE 30.16 FLUID PACK LOT @ [id number] H   CATARACT EXTRACTION W/PHACO Right 07/13/2023   Procedure: CATARACT EXTRACTION PHACO AND INTRAOCULAR LENS PLACEMENT (IOC) RIGHT DIABETIC 4.00 00:36.6;  Surgeon: Myrna,  Adine Anes, MD;  Location: [facility];  Service: Ophthalmology;  Laterality: Right;   CIRCUMCISION  1998   COLONOSCOPY  WITH PROPOFOL  N/A 08/20/2023   Procedure: COLONOSCOPY WITH PROPOFOL ;  Surgeon: Unk Corinn Skiff, MD;  Location: [facility]COPY;  Service: Gastroenterology;  Laterality: N/A;   EYE SURGERY Left    Retina Detachment   GRAFT APPLICATION Right 05/29/2017   Procedure: GRAFT APPLICATION ( RIGHT FOOT );  Surgeon: Jama Cordella MATSU, MD;  Location: [facility] ORS;  Service: Vascular;  Laterality: Right;  graft taken from patients right thigh   IRRIGATION AND DEBRIDEMENT FOOT Right 10/03/2016   Procedure: IRRIGATION AND DEBRIDEMENT FOOT;  Surgeon: Eva Gay, DPM;  Location: [facility] ORS;  Service: Podiatry;  Laterality: Right;   LOWER EXTREMITY ANGIOGRAPHY Right 02/24/2023   Procedure: Lower Extremity Angiography;  Surgeon: Jama Cordella MATSU, MD;  Location: [facility];  Service: Cardiovascular;  Laterality: Right;   LOWER EXTREMITY INTERVENTION  10/10/2016   Procedure: Lower Extremity Intervention;  Surgeon: Cordella MATSU Jama, MD;  Location: [facility];  Service: Cardiovascular;;   PERIPHERAL VASCULAR BALLOON ANGIOPLASTY Left 10/10/2016   Procedure: Peripheral Vascular Balloon Angioplasty;  Surgeon: Cordella MATSU Jama, MD;  Location: [facility];  Service: Cardiovascular;  Laterality: Left;   TONSILLECTOMY     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/2022   Procedure: TRANSMETATARSAL AMPUTATION;  Surgeon: Gay Eva, DPM;  Location: [facility];  Service: Podiatry;  Laterality: Right;   WOUND DEBRIDEMENT Right 11/07/2016   Procedure: DEBRIDEMENT OF WOUND AND BONE RIGHT FOOT AND APPLY WOUND VAC;  Surgeon: Eva Gay, DPM;  Location: [facility];  Service: Podiatry;  Laterality: Right;    SOCIAL HISTORY: Social History   Socioeconomic History   Marital status: Single    Spouse name: Not on file   Number of children: Not on file   Years of education: Not on file   Highest education level: Not on file  Occupational History   Not on file  Tobacco Use   Smoking status: Former     Types: Pipe, Cigarettes    Quit date: 05/23/1975    Years since quitting: 49.2   Smokeless tobacco: Never  Vaping Use   Vaping status: Never Used  Substance and Sexual Activity   Alcohol use: No   Drug use: No   Sexual activity: Not on file  Other Topics Concern   Not on file  Social History Narrative   Not on file   Social Drivers of Health   Financial Resource Strain: Low Risk  (08/28/2023)   Received from [facility]   Overall Financial Resource Strain (CARDIA)    Difficulty of Paying Living Expenses: Not hard at all  Recent Concern: Financial Resource Strain - Medium Risk (08/06/2023)   Received from [facility]   Overall Financial Resource Strain (CARDIA)    Difficulty of Paying Living Expenses: Somewhat hard  Food Insecurity: No Food Insecurity (07/29/2024)   Hunger Vital Sign    Worried About Running Out of Food in the Last Year: Never true    Ran Out of Food in the Last Year: Never true  Transportation Needs: No Transportation Needs (07/29/2024)   PRAPARE - Administrator, Civil Service (Medical): No    Lack of Transportation (Non-Medical): No  Physical Activity: Inactive (08/28/2023)   Received from [facility]   Exercise Vital Sign    On average, how many days per week do you engage in  moderate to strenuous exercise (like a brisk walk)?: 0 days    On average, how many minutes do you engage in exercise at this level?: 0 min  Stress: No Stress Concern Present (08/28/2023)   Received from [facility] - Occupational Stress Questionnaire    Feeling of Stress : Not at all  Social Connections: Moderately Integrated (07/29/2024)   Social Connection and Isolation Panel    Frequency of Communication with Friends and Family: Three times a week    Frequency of Social Gatherings with Friends and Family: Three times a week    Attends Religious Services:  1 to 4 times per year    Active Member of Clubs or Organizations: Yes    Attends Banker Meetings: 1 to 4 times per year    Marital Status: Never married  Intimate Partner Violence: Not At Risk (07/29/2024)   Humiliation, Afraid, Rape, and Kick questionnaire    Fear of Current or Ex-Partner: No    Emotionally Abused: No    Physically Abused: No    Sexually Abused: No    FAMILY HISTORY: Family History  Problem Relation Age of Onset   Diabetes Mother    Hypertension Mother    Diabetes Father    Hypertension Father    Cancer Father     ALLERGIES:  is allergic to sulfa antibiotics and piperacillin -tazobactam in dex.  MEDICATIONS:  Current Facility-Administered Medications  Medication Dose Route Frequency Provider Last Rate Last Admin   acetaminophen  (TYLENOL ) tablet 650 mg  650 mg Oral Q6H PRN Josette Ade, MD   650 mg at 08/01/24 0602   Or   acetaminophen  (TYLENOL ) suppository 650 mg  650 mg Rectal Q6H PRN Josette Ade, MD       aspirin  EC tablet 81 mg  81 mg Oral Daily Josette Ade, MD   81 mg at 08/01/24 9140   atorvastatin  (LIPITOR) tablet 10 mg  10 mg Oral QPM Josette Ade, MD   10 mg at 07/31/24 1751   collagenase  (SANTYL ) ointment   Topical Daily Josette Ade, MD   Given at 08/01/24 9093   enoxaparin  (LOVENOX ) injection 65 mg  65 mg Subcutaneous Q24H Josette Ade, MD   65 mg at 07/31/24 2225   filgrastim -aafi (NIVESTYM ) injection 480 mcg  480 mcg Subcutaneous ONCE-1800 Amin, Sumayya, MD       Gerhardt's butt cream   Topical BID Josette Ade, MD   Given at 08/01/24 9093   insulin  aspart (novoLOG ) injection 0-5 Units  0-5 Units Subcutaneous QHS Wieting, Richard, MD       insulin  aspart (novoLOG ) injection 0-6 Units  0-6 Units Subcutaneous TID WC Josette Ade, MD   1 Units at 08/01/24 1214   linagliptin  (TRADJENTA ) tablet 5 mg  5 mg Oral Daily Josette Ade, MD   5 mg at 08/01/24 9140   LORazepam  (ATIVAN ) tablet 1 mg  1 mg Oral  Q8H Wieting, Richard, MD   1 mg at 08/01/24 1344   metFORMIN  (GLUCOPHAGE ) tablet 1,000 mg  1,000 mg Oral BID WC Josette Ade, MD   1,000 mg at 08/01/24 9140   ondansetron  (ZOFRAN ) tablet 4 mg  4 mg Oral Q6H PRN Josette Ade, MD       Or   ondansetron  (ZOFRAN ) injection 4 mg  4 mg Intravenous Q6H PRN Wieting, Richard, MD       sodium phosphate  30 mmol in sodium chloride  0.9 % 250 mL infusion  30 mmol  Intravenous Once Caleen Qualia, MD 43 mL/hr at 07/29/24 2220 Restarted at 07/29/24 2220    PHYSICAL EXAMINATION:   Vitals:   08/01/24 0349 08/01/24 0722  BP: (!) 105/53 95/60  Pulse: 94 94  Resp:  16  Temp: 98.1 F (36.7 C) 98.1 F (36.7 C)  SpO2: 97% 99%   Filed Weights   07/24/24 1550  Weight: 289 lb 0.4 oz (131.1 kg)    Physical Exam Vitals and nursing note reviewed.  Constitutional:      Comments:     HENT:     Head: Normocephalic and atraumatic.     Mouth/Throat:     Mouth: Mucous membranes are moist.     Pharynx: No oropharyngeal exudate.  Eyes:     Pupils: Pupils are equal, round, and reactive to light.  Cardiovascular:     Rate and Rhythm: Normal rate and regular rhythm.  Pulmonary:     Effort: No respiratory distress.     Breath sounds: No wheezing.     Comments: Decreased breath sounds bilaterally at bases.  No wheeze or crackles Abdominal:     General: Bowel sounds are normal. There is no distension.     Palpations: Abdomen is soft. There is no mass.     Tenderness: There is no abdominal tenderness. There is no guarding or rebound.  Musculoskeletal:        General: No tenderness. Normal range of motion.     Cervical back: Normal range of motion and neck supple.  Skin:    General: Skin is warm.  Neurological:     Mental Status: He is alert and oriented to person, place, and time.  Psychiatric:        Mood and Affect: Affect normal.        Judgment: Judgment normal.     LABORATORY DATA:  I have reviewed the data as listed Lab Results   Component Value Date   WBC 0.8 (LL) 08/01/2024   HGB 8.8 (L) 08/01/2024   HCT 27.0 (L) 08/01/2024   MCV 86.8 08/01/2024   PLT 315 08/01/2024   Recent Labs    05/31/24 0817 07/07/24 1319 07/24/24 1533 07/25/24 0520 07/28/24 0509 07/29/24 0543 07/31/24 0421 08/01/24 0429  NA 136 136 140   < > 136 138  --  135  K 4.6 4.2 4.5   < > 4.4 4.2  --  4.5  CL 105 105 109   < > 108 108  --  103  CO2 23 23 19*   < > 20* 22  --  22  GLUCOSE 134* 62* 71   < > 128* 116*  --  117*  BUN 16 17 20    < > 13 11  --  11  CREATININE 0.72 0.84 0.91   < > 0.76 0.72 0.76 0.73  CALCIUM  8.7* 8.6* 8.4*   < > 7.6* 7.7*  --  8.1*  GFRNONAA >60 >60 >60   < > >60 >60 >60 >60  PROT 7.4 7.7 6.5  --   --   --   --   --   ALBUMIN 3.1* 3.6 3.3*  --  2.5* 2.5*  --  2.8*  AST 18 19 37  --   --   --   --   --   ALT 9 15 12   --   --   --   --   --   ALKPHOS 139* 176* 133*  --   --   --   --   --  BILITOT 0.6 0.5 0.2  --   --   --   --   --    < > = values in this interval not displayed.    RADIOGRAPHIC STUDIES: I have personally reviewed the radiological images as listed and agreed with the findings in the report. DG Chest Port 1 View Result Date: 07/26/2024 EXAM: 1 VIEW(S) XRAY OF THE CHEST 07/26/2024 04:23:00 PM COMPARISON: 07/24/2024 CLINICAL HISTORY: Neutropenic fever FINDINGS: LUNGS AND PLEURA: Low lung volumes. Stable left lung base atelectasis. No pleural effusion. No pneumothorax. HEART AND MEDIASTINUM: No acute abnormality of the cardiac and mediastinal silhouettes. BONES AND SOFT TISSUES: No acute osseous abnormality. IMPRESSION: 1. Stable left lung base atelectasis. Electronically signed by: Oneil Devonshire MD 07/26/2024 08:09 PM EST RP Workstation: [id number]   MR FOOT RIGHT W WO CONTRAST Result Date: 07/24/2024 CLINICAL DATA:  Right foot ulcer.  History of prior amputations. EXAM: MRI OF THE RIGHT FOREFOOT WITHOUT AND WITH CONTRAST TECHNIQUE: Multiplanar, multisequence MR imaging of the right foot was  performed before and after the administration of intravenous contrast. CONTRAST:  10mL GADAVIST  GADOBUTROL  1 MMOL/ML IV SOLN COMPARISON:  Radiographs 07/24/2024 FINDINGS: There is an open wound noted over the lateral aspect of the foot near the fifth metatarsal base. Again demonstrated are prior mid metatarsal amputations. I do not see any findings suspicious for osteomyelitis or septic arthritis. No rim enhancing subcutaneous abscess. Stable appearing exuberant periosteal reaction involving the fourth metatarsal shaft. Diffuse subcutaneous soft tissue swelling/edema suggesting cellulitis. Diffuse myositis and advanced fatty atrophy of the foot musculature but no evidence of pyomyositis. The major tendons and ligaments appear intact. IMPRESSION: 1. Open wound over the lateral aspect of the foot near the fifth metatarsal base. No findings suspicious for osteomyelitis or septic arthritis. 2. Diffuse subcutaneous soft tissue swelling/edema suggesting cellulitis. 3. Diffuse myositis and advanced fatty atrophy of the foot musculature but no evidence of pyomyositis. Electronically Signed   By: MYRTIS Stammer M.D.   On: 07/24/2024 20:10   CT Cervical Spine Wo Contrast Result Date: 07/24/2024 CLINICAL DATA:  Syncope, fell EXAM: CT CERVICAL SPINE WITHOUT CONTRAST TECHNIQUE: Multidetector CT imaging of the cervical spine was performed without intravenous contrast. Multiplanar CT image reconstructions were also generated. RADIATION DOSE REDUCTION: This exam was performed according to the departmental dose-optimization program which includes automated exposure control, adjustment of the mA and/or kV according to patient size and/or use of iterative reconstruction technique. COMPARISON:  06/01/2024 FINDINGS: Alignment: Slight reversal cervical lordosis likely due to multilevel spondylosis and facet hypertrophy. Otherwise alignment is anatomic. Skull base and vertebrae: Lytic bony metastases are noted within the spinous  process of C2, within the body and right transverse process of C7, and within the T1 vertebral body, similar to recent PET scan. There are no acute displaced fractures. Soft tissues and spinal canal: No prevertebral fluid or swelling. No visible canal hematoma. Disc levels: Bony fusion across the C3-4 facet joints. There is diffuse multilevel spondylosis with disc space narrowing and prominent anterior osteophyte greatest from C4-5 through C7-T1. Upper chest: Mottled sclerosis of the visualized portions of the thoracic cage consistent with bony metastases. Airway is patent. Lung apices are clear. Other: Reconstructed images demonstrate no additional findings. IMPRESSION: 1. No acute cervical spine fracture. 2. Multifocal bony metastases, consistent with metastatic prostate cancer and not appreciably changed from prior PET scan. Electronically Signed   By: Ozell Daring M.D.   On: 07/24/2024 16:42   CT Head Wo Contrast Result Date: 07/24/2024 CLINICAL DATA:  Syncope, fell EXAM: CT HEAD WITHOUT CONTRAST TECHNIQUE: Contiguous axial images were obtained from the base of the skull through the vertex without intravenous contrast. RADIATION DOSE REDUCTION: This exam was performed according to the departmental dose-optimization program which includes automated exposure control, adjustment of the mA and/or kV according to patient size and/or use of iterative reconstruction technique. COMPARISON:  None Available. FINDINGS: Brain: No acute infarct or hemorrhage. Lateral ventricles and midline structures are unremarkable. No acute extra-axial fluid collections. No mass effect. Vascular: No hyperdense vessel or unexpected calcification. Diffuse atherosclerosis. Skull: Normal. Negative for fracture or focal lesion. Sinuses/Orbits: No acute finding. Other: None. IMPRESSION: 1. No acute intracranial process. Electronically Signed   By: Ozell Daring M.D.   On: 07/24/2024 16:39   DG Chest Portable 1 View Result Date:  07/24/2024 EXAM: 1 VIEW(S) XRAY OF THE CHEST 07/24/2024 04:10:00 PM COMPARISON: Comparison with 01/03/2022. CLINICAL HISTORY: confusion FINDINGS: LUNGS AND PLEURA: Shallow inspiration with linear atelectasis in the lung bases. No airspace consolidation or edema. No pleural effusion. No pneumothorax. HEART AND MEDIASTINUM: Heart size and pulmonary vascularity are normal. No acute abnormality of the cardiac and mediastinal silhouettes. BONES AND SOFT TISSUES: Degenerative changes in the spine and shoulders. IMPRESSION: 1. No acute cardiopulmonary process identified. 2. Shallow inspiration with linear atelectasis in the lung bases. Electronically signed by: Elsie Gravely MD 07/24/2024 04:15 PM EST RP Workstation: [id number]   DG Foot 2 Views Right Result Date: 07/24/2024 CLINICAL DATA:  Right foot pain. EXAM: RIGHT FOOT - 2 VIEW COMPARISON:  None Available. FINDINGS: Status post transmetatarsal amputation. No acute fracture or dislocation. The bones are osteopenic. Degenerative changes of the midfoot and spurring. Soft tissues thickening over the stump. No soft tissue gas. Vascular calcifications noted. IMPRESSION: 1. No acute fracture or dislocation. 2. Status post transmetatarsal amputation. Electronically Signed   By: Vanetta Chou M.D.   On: 07/24/2024 16:14   DG Foot 2 Views Left Result Date: 07/24/2024 CLINICAL DATA:  Left heel ulcer.  Evaluate for soft tissue gas. EXAM: LEFT FOOT - 2 VIEW COMPARISON:  Left foot radiograph dated 05/03/2021. FINDINGS: No acute fracture or dislocation. The bones are osteopenic. No bone erosion or periosteal elevation. Vascular calcifications noted. No soft tissue gas. IMPRESSION: 1. No acute fracture or dislocation. 2. Osteopenia. Electronically Signed   By: Vanetta Chou M.D.   On: 07/24/2024 2:78    # 73 year old male patient with high risk-castrate sensitive prostate cancer metastatic to bone on Eligard plus Erleada  is currently admitted to hospital for  hypoglycemia.  Incidentally noted to have worsening leukopenia neutropenia and anemia.  # Worsening leukopenia/neutropenia-anemia etiology is unclear.  Question medications-antibiotics versus primary bone marrow process/infiltrative process like prostate cancer.  Patient has not significantly improved even on Granix .  # Metastatic prostate cancer-castrate sensitive with bone metastases-   # Multiple other medical problems including diabetes-labile blood sugars; history of diabetic foot infections/cubitus ulcers etc.   # Recommendation/plan:  # Given unclear etiology of the bicytopenia-severe leukopenia and neutropenia with worsening anemia-I recommend bone marrow biopsy for further evaluation.   Discussed with the patient the bone marrow biopsy and aspiration indication and procedure at length.  Given significant discomfort involved-I would recommend under sedation/with interventional radiology. I discussed the potential complications include-bleeding/trauma and risk of infection; which are fortunately very rare.  Patient is in agreement. Patient will sign the consent prior to the procedure. Bone marrow biopsy/aspiration is ordered.  I also discussed the procedure and the plan of care with the patient's  sister [name] over the phone.  # Thank you Dr.Amin MD for allowing me to participate in the care of your pleasant patient. Please do not hesitate to contact me with questions or concerns in the interim.  Above plan of care was discussed with patient/family in detail.  My contact information was given to the patient/family.     Cindy JONELLE Joe, MD 08/01/2024 3:25 PM

## 2024-08-01 NOTE — Progress Notes (Signed)
 Pt seen this am for PT session limited due to B foot discomfort in standing, therefore focused on transfer training only and education. Pt received sitting EOB. Required ModA to stand at RW from elevated bed. Cues for upright posture and safe wt shifting while turning towards bedside chair with heavy reliance on RW for balance. Pt assisted with eccentric control lowering into recliner, LE's elevated for edema control. All needs in reach. Currently awaiting transition to STR. Continue PT per POC.   08/01/24 1300  PT Visit Information  Assistance Needed +1  History of Present Illness Pt is a 73 year old male admitted with hypoglycemia, diabetic foot infection, decubitus ulcer of L heel, hypotension     PMH significant for metastatic prostate cancer, type 2 diabetes mellitus, arthritis, hypertension and hyperlipidemia.  Subjective Data  Subjective  I feel ok.  Patient Stated Goal get to a better rehab this time  Precautions  Precautions Fall  Recall of Precautions/Restrictions Intact  Restrictions  Weight Bearing Restrictions Per Provider Order Yes  RLE Weight Bearing Per Provider Order WBAT  LLE Weight Bearing Per Provider Order WBAT  Pain Assessment  Pain Assessment Faces  Faces Pain Scale 2  Pain Location R LE  Pain Descriptors / Indicators Aching  Pain Intervention(s) Limited activity within patient's tolerance  Cognition  Arousal Alert  Behavior During Therapy WFL for tasks assessed/performed  PT - Cognitive impairments No apparent impairments  PT - Cognition Comments Very pleasant and agreeable to session however lacks awareness of his deficits and safety concerns  Following Commands  Following commands Intact  Cueing  Cueing Techniques Verbal cues;Tactile cues  Communication  Communication No apparent difficulties  Bed Mobility  General bed mobility comments  (Pt sitting EOB upon arrriva)  Transfers  Overall transfer level Needs assistance  Equipment used Rolling walker (2  wheels)  Transfers Sit to/from Stand  Sit to Stand Mod assist;From elevated surface  Ambulation/Gait  General Gait Details  (Limited wt bearing activities to transfers only this session)  Balance  Overall balance assessment Needs assistance;History of Falls  Sitting-balance support No upper extremity supported;Feet supported  Sitting balance-Leahy Scale Good  Standing balance support Bilateral upper extremity supported;During functional activity;Reliant on assistive device for balance  Standing balance-Leahy Scale Poor  Standing balance comment  (Heavy reliance on RW for bed<>chair transfers)  Other Exercises  Other Exercises edu re role of PT, role of rehab, discharge recommendations  PT - End of Session  Equipment Utilized During Treatment Gait belt  Activity Tolerance Patient tolerated treatment well  Patient left in chair;with call bell/phone within reach;with chair alarm set  Nurse Communication Mobility status   PT - Assessment/Plan  PT Visit Diagnosis Repeated falls (R29.6);Muscle weakness (generalized) (M62.81);History of falling (Z91.81);Difficulty in walking, not elsewhere classified (R26.2);Pain  Pain - Right/Left Right  Pain - part of body Ankle and joints of foot  PT Frequency (ACUTE ONLY) Min 2X/week  Follow Up Recommendations Skilled nursing-short term rehab (<3 hours/day)  Can patient physically be transported by private vehicle No  Patient can return home with the following A lot of help with walking and/or transfers;A lot of help with bathing/dressing/bathroom;Assistance with cooking/housework;Direct supervision/assist for medications management;Direct supervision/assist for financial management;Assist for transportation;Help with stairs or ramp for entrance;Supervision due to cognitive status  PT equipment Other (comment) (TBD at next level of care)  AM-PAC PT 6 Clicks Mobility Outcome Measure (Version 2)  Help needed turning from your back to your side while in a  flat bed  without using bedrails? 2  Help needed moving from lying on your back to sitting on the side of a flat bed without using bedrails? 2  Help needed moving to and from a bed to a chair (including a wheelchair)? 2  Help needed standing up from a chair using your arms (e.g., wheelchair or bedside chair)? 2  Help needed to walk in hospital room? 1  Help needed climbing 3-5 steps with a railing?  1  6 Click Score 10  Consider Recommendation of Discharge To: CIR/SNF/LTACH  Progressive Mobility  What is the highest level of mobility based on the mobility assessment? Level 3 (Stands with assistance) - Balance while standing  and cannot march in place  Activity Stood at bedside;Pivoted/transferred from bed to chair  PT Time Calculation  PT Start Time (ACUTE ONLY) 1020  PT Stop Time (ACUTE ONLY) 1040  PT Time Calculation (min) (ACUTE ONLY) 20 min  PT General Charges  $$ ACUTE PT VISIT 1 Visit  PT Treatments  $Therapeutic Activity 8-22 mins  Darice Bohr, PTA

## 2024-08-01 NOTE — TOC Progression Note (Signed)
 Transition of Care [facility]) - Progression Note    Patient Details  Name: [name] MRN: [id number] Date of Birth: 03/04/1951  Transition of Care [facility]) CM/SW Contact  Dalia GORMAN Fuse, RN Phone Number: 08/01/2024, 1:45 PM  Clinical Narrative:     TOC received secure chat advising the patient would like to go to the facility in [place], [place] instead of [facility]. TOC made CMA aware that the facility in [place], [place] is [facility]. Shara is still pending.                     Expected Discharge Plan and Services                                               Social Drivers of Health (SDOH) Interventions SDOH Screenings   Food Insecurity: No Food Insecurity (07/29/2024)  Housing: High Risk (07/29/2024)  Transportation Needs: No Transportation Needs (07/29/2024)  Utilities: Not At Risk (07/29/2024)  Depression (PHQ2-9): Low Risk  (07/07/2024)  Financial Resource Strain: Low Risk  (08/28/2023)   Received from [facility]  Recent Concern: Financial Resource Strain - Medium Risk (08/06/2023)   Received from [facility]  Physical Activity: Inactive (08/28/2023)   Received from [facility]  Social Connections: Moderately Integrated (07/29/2024)  Stress: No Stress Concern Present (08/28/2023)   Received from [facility]  Tobacco Use: Medium Risk (07/24/2024)  Health Literacy: Adequate Health Literacy (08/28/2023)   Received from [facility]    Readmission Risk Interventions     No data to display

## 2024-08-01 NOTE — Progress Notes (Signed)
 Occupational Therapy Treatment Patient Details Name: [name] MRN: [id number] DOB: 02/19/1951 Today's Date: 08/01/2024   History of present illness Pt is a 73 year old male admitted with hypoglycemia, diabetic foot infection, decubitus ulcer of L heel, hypotension     PMH significant for metastatic prostate cancer, type 2 diabetes mellitus, arthritis, hypertension and hyperlipidemia.   OT comments  Pt is seated in recliner on arrival. Pt agreeable to OT session. He denies pain most of session, tender L heel during donning sock which required Max A. Multiple STS bouts performed from recliner with cues for hand placement, anterior weight shift, and forward scoot to edge of chair requiring Mod to Max A with ~15-20 sec max standing tolerance. Pt performed seated grooming tasks with set up assist d/t poor standing balance with BUE support. He remains very weak with low activity tolerance. Strongly recommend STR at DC. Pt left with all needs in place and will cont to require skilled acute OT services to maximize his safety and IND to return to PLOF.       If plan is discharge home, recommend the following:  A lot of help with walking and/or transfers;A lot of help with bathing/dressing/bathroom   Equipment Recommendations  Other (comment) (defer)    Recommendations for Other Services      Precautions / Restrictions Precautions Precautions: Fall Recall of Precautions/Restrictions: Intact Restrictions Weight Bearing Restrictions Per Provider Order: Yes RLE Weight Bearing Per Provider Order: Weight bearing as tolerated LLE Weight Bearing Per Provider Order: Weight bearing as tolerated       Mobility Bed Mobility               General bed mobility comments: NT up in recliner pre/post session    Transfers Overall transfer level: Needs assistance Equipment used: Rolling walker (2 wheels) Transfers: Sit to/from Stand Sit to Stand: Mod assist, Max assist            General transfer comment: increased time and assist needed for lift off with cues for hand placement and anterior weight shift; performed 3 bouts of standing ~15-20 sec max standing tolerance     Balance Overall balance assessment: Needs assistance, History of Falls Sitting-balance support: No upper extremity supported, Feet supported Sitting balance-Leahy Scale: Fair     Standing balance support: Bilateral upper extremity supported, During functional activity, Reliant on assistive device for balance Standing balance-Leahy Scale: Poor Standing balance comment: RW and external support                           ADL either performed or assessed with clinical judgement   ADL Overall ADL's : Needs assistance/impaired     Grooming: Oral care;Sitting;Set up Grooming Details (indicate cue type and reason): attempted in standing, however poor standing balance and required to perform in sitting             Lower Body Dressing: Maximal assistance Lower Body Dressing Details (indicate cue type and reason): donn sock                    Extremity/Trunk Assessment              Vision       Perception     Praxis     Communication Communication Communication: No apparent difficulties   Cognition Arousal: Alert Behavior During Therapy: [facility] for tasks assessed/performed  Following commands: Intact        Cueing   Cueing Techniques: Verbal cues, Tactile cues  Exercises      Shoulder Instructions       General Comments fatigues easily    Pertinent Vitals/ Pain       Pain Assessment Pain Assessment: Faces Faces Pain Scale: Hurts little more Pain Location: L heel while donning sock d/t tightness Pain Descriptors / Indicators: Tender, Sore Pain Intervention(s): Monitored during session, Repositioned  Home Living                                          Prior Functioning/Environment               Frequency  Min 2X/week        Progress Toward Goals  OT Goals(current goals can now be found in the care plan section)  Progress towards OT goals: Progressing toward goals  Acute Rehab OT Goals Patient Stated Goal: go home OT Goal Formulation: With patient Time For Goal Achievement: 08/09/24 Potential to Achieve Goals: Fair  Plan      Co-evaluation                 AM-PAC OT 6 Clicks Daily Activity     Outcome Measure   Help from another person eating meals?: None Help from another person taking care of personal grooming?: A Little Help from another person toileting, which includes using toliet, bedpan, or urinal?: A Lot Help from another person bathing (including washing, rinsing, drying)?: A Lot Help from another person to put on and taking off regular upper body clothing?: A Little Help from another person to put on and taking off regular lower body clothing?: A Lot 6 Click Score: 16    End of Session Equipment Utilized During Treatment: Rolling walker (2 wheels)  OT Visit Diagnosis: Other abnormalities of gait and mobility (R26.89);Muscle weakness (generalized) (M62.81)   Activity Tolerance Patient tolerated treatment well   Patient Left with call bell/phone within reach;in chair;with chair alarm set   Nurse Communication Mobility status        Time: 8860-8842 OT Time Calculation (min): 18 min  Charges: OT General Charges $OT Visit: 1 Visit OT Treatments $Therapeutic Activity: 8-22 mins  Kenasia Scheller Chrismon, OTR/L  08/01/24, 1:48 PM   Dulcie Gammon E Chrismon 08/01/2024, 1:44 PM

## 2024-08-01 NOTE — Progress Notes (Signed)
 Progress Note   Patient: [name] FMW:[id number] DOB: May 03, 1951 DOA: 07/24/2024     7 DOS: the patient was seen and examined on 08/01/2024   Brief hospital course: 73 y.o. male with medical history significant of metastatic prostate cancer, type 2 diabetes mellitus, arthritis, hypertension and hyperlipidemia.  He presents to the hospital with low sugar.  He states his sister found him on the floor.  His sugar was 36.  Feels a little weak.  He states that he has been taking around 20 units of Lantus  and supposed to take 10.  He has also been taking 12 units of Humalog and was supposed to take 4.  He states he cannot see very well.  He draws up his own insulin  and injects his own insulin .  When he has a low sugar he feels out of it.  In the ER his sugar was in the 50s and 60s.  Hospitalist services contacted for further evaluation.  Patient also states he has an ulcer of his right foot that has been draining a little bit.  No fever chills or sweats.  He finished up radiation treatment for metastatic prostate cancer.  Follows with Dr. Ashley for his foot.  Patient put on D5W and IV antibiotics.  11/24.  Patient taken off D5W drip and will watch sugars another day.  Concerned about his vision and drawing up insulin .  Wondering if I can do pills instead.  MRI right foot did not show osteomyelitis.  Podiatry saw patient and recommended outpatient follow-up. 11/25.  Physical therapy recommending rehab from yesterday.  Case discussed with diabetes coordinator and will do Tradjenta  and Glucophage  and get rid of insulin .  Podiatry was okay with how the wounds look.  Will need close follow-up as outpatient. 11/25 continued.  Patient spiked a fever of 100.7.  Treat as neutropenic fever.  Blood cultures, urine culture and chest x-ray ordered.  Maxipime  ordered.  Continue doxycycline .  11/26: Maximum temperature recorded of 102.7 over the past 24-hour, worsening leukopenia at 0.9-ordered another dose of  Granix .  Preliminary blood cultures negative, checking respiratory viral panel, negative COVID, influenza and RSV.  Continuing cefepime  for now.  11/27: Afebrile over the past 24-hour, WBC at 1.0 and neutrophils of 0.  Discussed with Dr. Jacobo from oncology, who advised to continue Granix  until neutrophil of 1000.  Another dose of 480 mg ordered today.  Mild hypophosphatemia which is being repleted.  11/28: Remained afebrile.  WBC still at 1 with neutrophil of 0, continuing Granix , phosphorus improved to 2.4-giving another dose. Foot wound does not look infected.  11/29: Remained afebrile, WBC still at 0.9 with neutrophils of 0, case was discussed again with Dr. Jacobo who advised to discontinue antibiotics and continue with Granix , continue holding Erleada .  11/30: Remained afebrile and hemodynamically stable, slowly worsening leukopenia now WBC at 0.8, neutrophil remains 0.  Case was discussed again with Dr. Billi to continue with Granix  and he will discuss with his oncologist Dr. Rennie tomorrow as he might need bone marrow biopsy.  12/1: Remained afebrile but WBC still at 0.8 with 0 neutrophil.  Bone marrow biopsy scheduled for tomorrow.  Assessment and Plan: * Hypoglycemia Likely secondary to taking too much insulin  and poor vision on drawing up his insulin .  Initially placed on D5 drip.  Taken off D5 drip on 11/24.  Case discussed with diabetes coordinator will try Tradjenta  and Glucophage  to maintain sugars.  Discontinue insulin .  Leukopenia Febrile illness with leukopenia. WBC of 0.8  and neutrophil of 0 today.  No obvious infection found, RVP was negative.  Remained afebrile  Case was discussed with oncology again and they recommend continuation of Granix  until neutrophils are above 1000 No change despite getting Granix  for the past many days. -Bone marrow biopsy scheduled for tomorrow by oncology - Ordered another dose of Granix  - Continue to monitor CBC with  differential   Diabetic foot infection [facility]) Appreciate podiatry consultation.  MRI does not show any osteomyelitis.   Superficial wound cultures with corynebacterium.   Will need close follow-up as outpatient. Continue with wound care  Decubitus ulcer of left heel, unstageable (HCC) heel protector.  Hypotension Blood pressure improved from admission.  Hold antihypertensive medication.  Prostate cancer metastatic to multiple sites [facility]) Patient states he has finished up radiation therapy.  Patient on apalutamide  (holding here).  Hyperlipidemia, unspecified Continue Lipitor  Anemia of chronic disease Hemoglobin 8.3  Obesity, Class III, BMI 40-49.9 (morbid obesity) (HCC) BMI 40.31 with current height and weight in computer.  Pressure injury Wound 07/25/24 Pressure Injury Heel Left Unstageable - Full thickness tissue loss in which the base of the injury is covered by slough (yellow, tan, gray, green or brown) and/or eschar (tan, brown or black) in the wound bed. (Active)   Bilateral buttocks stage II with red moist macerated skin and patchy areas of partial-thickness loss consistent with moisture associated skin damage.  Both present on admission.      Subjective: Patient was seen and examined today.  No new concern.  He was frustrated about his counts and wants to go home.  Physical Exam: Vitals:   07/31/24 1757 07/31/24 1923 08/01/24 0349 08/01/24 0722  BP: 98/68 (!) 99/51 (!) 105/53 95/60  Pulse: 100 99 94 94  Resp:    16  Temp:  98.1 F (36.7 C) 98.1 F (36.7 C) 98.1 F (36.7 C)  TempSrc:  Oral Oral   SpO2:  100% 97% 99%  Weight:      Height:       General.  Obese gentleman, in no acute distress. Pulmonary.  Lungs clear bilaterally, normal respiratory effort. CV.  Regular rate and rhythm, no JVD, rub or murmur. Abdomen.  Soft, nontender, nondistended, BS positive. CNS.  Alert and oriented .  No focal neurologic deficit. Extremities.  No edema, right foot  with Ace wrap Psychiatry.  Judgment and insight appears normal.    Data Reviewed: Prior data reviewed  Family Communication: Discussed with sister at bedside  Disposition: Status is: Inpatient Remains inpatient appropriate because: Physical therapy recommending rehab.  Took 2 people to get him up.  Planned Discharge Destination: Rehab  Time spent: 50 minutes  This record has been created using Conservation officer, historic buildings. Errors have been sought and corrected,but may not always be located. Such creation errors do not reflect on the standard of care.   Author: Amaryllis Dare, MD 08/01/2024 3:46 PM  For on call review [email or web address].

## 2024-08-02 ENCOUNTER — Inpatient Hospital Stay

## 2024-08-02 ENCOUNTER — Inpatient Hospital Stay: Admitting: Radiology

## 2024-08-02 DIAGNOSIS — D709 Neutropenia, unspecified: Secondary | ICD-10-CM | POA: Diagnosis not present

## 2024-08-02 DIAGNOSIS — E11628 Type 2 diabetes mellitus with other skin complications: Secondary | ICD-10-CM | POA: Diagnosis not present

## 2024-08-02 DIAGNOSIS — E162 Hypoglycemia, unspecified: Secondary | ICD-10-CM | POA: Diagnosis not present

## 2024-08-02 DIAGNOSIS — I959 Hypotension, unspecified: Secondary | ICD-10-CM | POA: Diagnosis not present

## 2024-08-02 LAB — CBC WITH DIFFERENTIAL/PLATELET
Abs Immature Granulocytes: 0 K/uL (ref 0.00–0.07)
Basophils Absolute: 0 K/uL (ref 0.0–0.1)
Basophils Relative: 1 %
Eosinophils Absolute: 0 K/uL (ref 0.0–0.5)
Eosinophils Relative: 3 %
HCT: 26.1 % — ABNORMAL LOW (ref 39.0–52.0)
Hemoglobin: 8.6 g/dL — ABNORMAL LOW (ref 13.0–17.0)
Immature Granulocytes: 0 %
Lymphocytes Relative: 49 %
Lymphs Abs: 0.4 K/uL — ABNORMAL LOW (ref 0.7–4.0)
MCH: 28.9 pg (ref 26.0–34.0)
MCHC: 33 g/dL (ref 30.0–36.0)
MCV: 87.6 fL (ref 80.0–100.0)
Monocytes Absolute: 0.3 K/uL (ref 0.1–1.0)
Monocytes Relative: 44 %
Neutro Abs: 0 K/uL — CL (ref 1.7–7.7)
Neutrophils Relative %: 3 %
Platelets: 306 K/uL (ref 150–400)
RBC: 2.98 MIL/uL — ABNORMAL LOW (ref 4.22–5.81)
RDW: 17.4 % — ABNORMAL HIGH (ref 11.5–15.5)
Smear Review: NORMAL
WBC: 0.8 K/uL — CL (ref 4.0–10.5)
nRBC: 0 % (ref 0.0–0.2)

## 2024-08-02 LAB — GLUCOSE, CAPILLARY
Glucose-Capillary: 119 mg/dL — ABNORMAL HIGH (ref 70–99)
Glucose-Capillary: 121 mg/dL — ABNORMAL HIGH (ref 70–99)
Glucose-Capillary: 144 mg/dL — ABNORMAL HIGH (ref 70–99)
Glucose-Capillary: 123 mg/dL — ABNORMAL HIGH (ref 70–99)

## 2024-08-02 MED ORDER — HEPARIN SOD (PORK) LOCK FLUSH 100 UNIT/ML IV SOLN
INTRAVENOUS | Status: AC
  Filled 2024-08-02: qty 5

## 2024-08-02 MED ORDER — MIDAZOLAM HCL (PF) 2 MG/2ML IJ SOLN
INTRAMUSCULAR | Status: AC | PRN
  Administered 2024-08-02 (×2): .5 mg via INTRAVENOUS
  Administered 2024-08-02: 1 mg via INTRAVENOUS

## 2024-08-02 MED ORDER — HEPARIN SOD (PORK) LOCK FLUSH 100 UNIT/ML IV SOLN
500.0000 [IU] | Freq: Once | INTRAVENOUS | Status: AC
  Administered 2024-08-02: 500 [IU] via INTRAVENOUS

## 2024-08-02 MED ORDER — MIDAZOLAM HCL 2 MG/2ML IJ SOLN
INTRAMUSCULAR | Status: AC
  Filled 2024-08-02: qty 4

## 2024-08-02 MED ORDER — LIDOCAINE HCL (PF) 1 % IJ SOLN
10.0000 mL | Freq: Once | INTRAMUSCULAR | Status: AC
  Administered 2024-08-02: 10 mL via INTRADERMAL
  Filled 2024-08-02: qty 10

## 2024-08-02 MED ORDER — FENTANYL CITRATE (PF) 100 MCG/2ML IJ SOLN
INTRAMUSCULAR | Status: AC | PRN
  Administered 2024-08-02 (×4): 25 ug via INTRAVENOUS

## 2024-08-02 MED ORDER — FENTANYL CITRATE (PF) 100 MCG/2ML IJ SOLN
INTRAMUSCULAR | Status: AC
  Filled 2024-08-02: qty 4

## 2024-08-02 NOTE — TOC Progression Note (Signed)
 Transition of Care [facility]) - Progression Note    Patient Details  Name: [name] MRN: [id number] Date of Birth: Dec 30, 1950  Transition of Care [facility]) CM/SW Contact  Dalia GORMAN Fuse, RN Phone Number: 08/02/2024, 12:49 PM  Clinical Narrative:     Approved Plan AuthID: [id number] Dates: 12/1-12/10/2023 Next Review Date: 08/03/2024. TOC spoke with Tammy with [facility]. They have a bed and can admit the patient today. TOC made the hospital MD aware, she is checking with the patient's oncologist to determine if the patient is medically ready.  TOC will continue to follow.                    Expected Discharge Plan and Services                                               Social Drivers of Health (SDOH) Interventions SDOH Screenings   Food Insecurity: No Food Insecurity (07/29/2024)  Housing: High Risk (07/29/2024)  Transportation Needs: No Transportation Needs (07/29/2024)  Utilities: Not At Risk (07/29/2024)  Depression (PHQ2-9): Low Risk  (07/07/2024)  Financial Resource Strain: Low Risk  (08/28/2023)   Received from [facility]  Recent Concern: Financial Resource Strain - Medium Risk (08/06/2023)   Received from [facility]  Physical Activity: Inactive (08/28/2023)   Received from [facility]  Social Connections: Moderately Integrated (07/29/2024)  Stress: No Stress Concern Present (08/28/2023)   Received from [facility]  Tobacco Use: Medium Risk (07/24/2024)  Health Literacy: Adequate Health Literacy (08/28/2023)   Received from [facility]    Readmission Risk Interventions     No data to display

## 2024-08-02 NOTE — Progress Notes (Signed)
 Progress Note   Patient: [name] FMW:[id number] DOB: [birth year] DOA: 07/24/2024     8 DOS: the patient was seen and examined on 08/02/2024   Brief hospital course: 73 y.o. male with medical history significant of metastatic prostate cancer, type 2 diabetes mellitus, arthritis, hypertension and hyperlipidemia.  He presents to the hospital with low sugar.  He states his sister found him on the floor.  His sugar was 36.  Feels a little weak.  He states that he has been taking around 20 units of Lantus  and supposed to take 10.  He has also been taking 12 units of Humalog and was supposed to take 4.  He states he cannot see very well.  He draws up his own insulin  and injects his own insulin .  When he has a low sugar he feels out of it.  In the ER his sugar was in the 50s and 60s.  Hospitalist services contacted for further evaluation.  Patient also states he has an ulcer of his right foot that has been draining a little bit.  No fever chills or sweats.  He finished up radiation treatment for metastatic prostate cancer.  Follows with Dr. Ashley for his foot.  Patient put on D5W and IV antibiotics.  11/24.  Patient taken off D5W drip and will watch sugars another day.  Concerned about his vision and drawing up insulin .  Wondering if I can do pills instead.  MRI right foot did not show osteomyelitis.  Podiatry saw patient and recommended outpatient follow-up. 11/25.  Physical therapy recommending rehab from yesterday.  Case discussed with diabetes coordinator and will do Tradjenta  and Glucophage  and get rid of insulin .  Podiatry was okay with how the wounds look.  Will need close follow-up as outpatient. 11/25 continued.  Patient spiked a fever of 100.7.  Treat as neutropenic fever.  Blood cultures, urine culture and chest x-ray ordered.  Maxipime  ordered.  Continue doxycycline .  11/26: Maximum temperature recorded of 102.7 over the past 24-hour, worsening leukopenia at 0.9-ordered another dose of  Granix .  Preliminary blood cultures negative, checking respiratory viral panel, negative COVID, influenza and RSV.  Continuing cefepime  for now.  11/27: Afebrile over the past 24-hour, WBC at 1.0 and neutrophils of 0.  Discussed with Dr. Jacobo from oncology, who advised to continue Granix  until neutrophil of 1000.  Another dose of 480 mg ordered today.  Mild hypophosphatemia which is being repleted.  11/28: Remained afebrile.  WBC still at 1 with neutrophil of 0, continuing Granix , phosphorus improved to 2.4-giving another dose. Foot wound does not look infected.  11/29: Remained afebrile, WBC still at 0.9 with neutrophils of 0, case was discussed again with Dr. Jacobo who advised to discontinue antibiotics and continue with Granix , continue holding Erleada .  11/30: Remained afebrile and hemodynamically stable, slowly worsening leukopenia now WBC at 0.8, neutrophil remains 0.  Case was discussed again with Dr. Billi to continue with Granix  and he will discuss with his oncologist Dr. Rennie tomorrow as he might need bone marrow biopsy.  12/1: Remained afebrile but WBC still at 0.8 with 0 neutrophil.  Bone marrow biopsy scheduled for tomorrow.  12/2: Hemodynamically stable, no change in cell count.  S/p bone marrow biopsy today.  Insurance authorization obtained for SNF, message sent to Dr. Rennie when he thinks he will be safe to move as neutrophil remains 0.  Assessment and Plan: * Hypoglycemia Likely secondary to taking too much insulin  and poor vision on drawing up his insulin .  Initially placed on D5 drip.  Taken off D5 drip on 11/24.  Case discussed with diabetes coordinator will try Tradjenta  and Glucophage  to maintain sugars.  Discontinue insulin .  Leukopenia Febrile illness with leukopenia. WBC of 0.8 and neutrophil of 0 today.  No obvious infection found, RVP was negative.  Remained afebrile  Case was discussed with oncology again and they recommend  continuation of Granix  until neutrophils are above 1000 No change despite getting Granix  for the past many days. -Bone marrow biopsy done today - Received multiple doses of Granix -holding today - Continue to monitor CBC with differential   Diabetic foot infection [facility]) Appreciate podiatry consultation.  MRI does not show any osteomyelitis.   Superficial wound cultures with corynebacterium.   Will need close follow-up as outpatient. Continue with wound care  Decubitus ulcer of left heel, unstageable (HCC) heel protector.  Hypotension Blood pressure improved from admission.  Hold antihypertensive medication.  Prostate cancer metastatic to multiple sites [facility]) Patient states he has finished up radiation therapy.  Patient on apalutamide  (holding here).  Hyperlipidemia, unspecified Continue Lipitor  Anemia of chronic disease Hemoglobin 8.3  Obesity, Class III, BMI 40-49.9 (morbid obesity) (HCC) BMI 40.31 with current height and weight in computer.  Pressure injury Wound 07/25/24 Pressure Injury Heel Left Unstageable - Full thickness tissue loss in which the base of the injury is covered by slough (yellow, tan, gray, green or brown) and/or eschar (tan, brown or black) in the wound bed. (Active)   Bilateral buttocks stage II with red moist macerated skin and patchy areas of partial-thickness loss consistent with moisture associated skin damage.  Both present on admission.      Subjective: Patient was little lethargic and somnolent after getting bone marrow biopsy when seen today.  He did received sedating medications.  No other concerns.  Physical Exam: Vitals:   08/02/24 0855 08/02/24 0915 08/02/24 0930 08/02/24 0945  BP: 93/77 (!) 129/52 125/70 108/62  Pulse: 96 90 89 90  Resp: (!) 26 14 17 19   Temp: (!) 97.1 F (36.2 C)     TempSrc: Temporal     SpO2: 96% 94% 96% 94%  Weight:      Height:       General.  Obese gentleman, in no acute distress. Pulmonary.  Lungs  clear bilaterally, normal respiratory effort. CV.  Regular rate and rhythm, no JVD, rub or murmur. Abdomen.  Soft, nontender, nondistended, BS positive. CNS.  Alert and oriented .  No focal neurologic deficit. Extremities.  No edema, pulses intact and symmetrical.  Right foot with Ace wrap Psychiatry.  Judgment and insight appears normal.     Data Reviewed: Prior data reviewed  Family Communication: Discussed with patient  Disposition: Status is: Inpatient Remains inpatient appropriate because: Physical therapy recommending rehab.  Took 2 people to get him up.  Planned Discharge Destination: Rehab  Time spent: 50 minutes  This record has been created using Conservation officer, historic buildings. Errors have been sought and corrected,but may not always be located. Such creation errors do not reflect on the standard of care.   Author: Amaryllis Dare, MD 08/02/2024 3:41 PM  For on call review [email or web address].

## 2024-08-02 NOTE — Procedures (Signed)
 Vascular and Interventional Radiology Procedure Note  Patient: [name] DOB: [birth year] Medical Record Number: [id number] Note Date/Time: 08/02/24 8:25 AM   Performing Physician: Thom Hall, MD Assistant(s): None  Diagnosis: Neutropenia. Hx met prostate CA   Procedure: BONE MARROW ASPIRATION and BIOPSY  Anesthesia: Conscious Sedation Complications: None Estimated Blood Loss: Minimal Specimens: Sent for Pathology  Findings:  Successful CT-guided bone marrow aspiration and biopsy A total of 1 cores were obtained. Hemostasis of the tract was achieved using Manual Pressure.  Plan: Bed rest for 1 hours.  See detailed procedure note with images in PACS. The patient tolerated the procedure well without incident or complication and was returned to Recovery in stable condition.    Thom Hall, MD [facility] Radiology   Pager. [phone] Clinic. [phone]

## 2024-08-03 ENCOUNTER — Other Ambulatory Visit: Payer: Self-pay | Admitting: Internal Medicine

## 2024-08-03 ENCOUNTER — Other Ambulatory Visit: Payer: Self-pay

## 2024-08-03 ENCOUNTER — Other Ambulatory Visit (HOSPITAL_COMMUNITY): Payer: Self-pay

## 2024-08-03 DIAGNOSIS — D708 Other neutropenia: Secondary | ICD-10-CM | POA: Diagnosis not present

## 2024-08-03 DIAGNOSIS — E162 Hypoglycemia, unspecified: Secondary | ICD-10-CM | POA: Diagnosis not present

## 2024-08-03 DIAGNOSIS — C61 Malignant neoplasm of prostate: Secondary | ICD-10-CM

## 2024-08-03 LAB — CBC WITH DIFFERENTIAL/PLATELET
Abs Immature Granulocytes: 0 K/uL (ref 0.00–0.07)
Basophils Absolute: 0 K/uL (ref 0.0–0.1)
Basophils Relative: 1 %
Eosinophils Absolute: 0 K/uL (ref 0.0–0.5)
Eosinophils Relative: 1 %
HCT: 26 % — ABNORMAL LOW (ref 39.0–52.0)
Hemoglobin: 8.6 g/dL — ABNORMAL LOW (ref 13.0–17.0)
Immature Granulocytes: 0 %
Lymphocytes Relative: 79 %
Lymphs Abs: 0.6 K/uL — ABNORMAL LOW (ref 0.7–4.0)
MCH: 28.6 pg (ref 26.0–34.0)
MCHC: 33.1 g/dL (ref 30.0–36.0)
MCV: 86.4 fL (ref 80.0–100.0)
Monocytes Absolute: 0.1 K/uL (ref 0.1–1.0)
Monocytes Relative: 19 %
Neutro Abs: 0 K/uL — CL (ref 1.7–7.7)
Neutrophils Relative %: 0 %
Platelets: 322 K/uL (ref 150–400)
RBC: 3.01 MIL/uL — ABNORMAL LOW (ref 4.22–5.81)
RDW: 17.2 % — ABNORMAL HIGH (ref 11.5–15.5)
Smear Review: NORMAL
WBC: 0.7 K/uL — CL (ref 4.0–10.5)
nRBC: 0 % (ref 0.0–0.2)

## 2024-08-03 LAB — GLUCOSE, CAPILLARY
Glucose-Capillary: 128 mg/dL — ABNORMAL HIGH (ref 70–99)
Glucose-Capillary: 121 mg/dL — ABNORMAL HIGH (ref 70–99)

## 2024-08-03 MED ORDER — FILGRASTIM-AAFI 480 MCG/0.8ML IJ SOSY
480.0000 ug | PREFILLED_SYRINGE | Freq: Every day | INTRAMUSCULAR | Status: DC
  Administered 2024-08-03 – 2024-08-12 (×9): 480 ug via SUBCUTANEOUS
  Filled 2024-08-03 (×9): qty 0.8

## 2024-08-03 MED ORDER — APALUTAMIDE 60 MG PO TABS
240.0000 mg | ORAL_TABLET | Freq: Every day | ORAL | 0 refills | Status: DC
  Filled 2024-08-03 – 2024-08-16 (×2): qty 120, 30d supply, fill #0

## 2024-08-03 MED ORDER — ATORVASTATIN CALCIUM 10 MG PO TABS
10.0000 mg | ORAL_TABLET | Freq: Every evening | ORAL | Status: DC
  Administered 2024-08-03 – 2024-08-11 (×9): 10 mg via ORAL
  Filled 2024-08-03 (×10): qty 1

## 2024-08-03 NOTE — Progress Notes (Signed)
 PROGRESS NOTE [name]    DOB: 1950-10-01, 73 y.o.  FMW:[id number]    Code Status: Full Code   DOA: 07/24/2024   LOS: 9  Brief hospital course  [name] is a 74 y.o. male with a PMH significant for metastatic prostate cancer, type 2 diabetes mellitus, arthritis, hypertension and hyperlipidemia. He presents to the hospital with low sugar. He states his sister found him on the floor. His sugar was 36. He reports significantly increased use of insulin  from his prescribed doses.  Started on D5w and has since recovered with good glucose control with oral meds alone.  In addition, developed neutropenic fever. Oncology consulted. Underwent BMB 12/2. Awaiting results. Receiving Granix  treatments to support WBC but not responding well. Overall appears to be stable and will be going to SNF once definitive dx and treatment of leukopenia completed/stabilized.   08/03/24 -grannix tx, BMB results pending. Oncology following. Patient feels he is improving clinically   Assessment & Plan  Principal Problem:   Hypoglycemia Active Problems:   Diabetic foot infection (HCC)   Leukopenia   Decubitus ulcer of left heel, unstageable (HCC)   Hypotension   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)   Hyperlipidemia, unspecified   Anemia of chronic disease   Obesity, Class III, BMI 40-49.9 (morbid obesity) (HCC)   Pressure injury   Pressure injury of buttock, stage 2 (HCC)  Diabetes Mellitis in remission. Hgb A1c 5.6 presenting with severe Hypoglycemia due to overuse of insulin .  S/p d5w gtt - discontinue insulin  use.  - Tradjenta  and Glucophage  to maintain sugars.    Leukopenia Febrile illness with leukopenia. WBC of 0.7 and neutrophil of 0 today.  No obvious infection found, RVP was negative.  Remains afebrile - s/p BMB 12/2. Results pending - heme/onc following, appreciate your recs - repeat granix  today - CBC diff am   Bilateral buttocks stage II  Decubitus ulcer of left  heel, unstageable (HCC) Diabetic foot infection- MRI does not show any osteomyelitis.   Superficial wound cultures with corynebacterium. - WOC consulted     Hypotension Blood pressure improved from admission.  Hold antihypertensive medication.   Prostate cancer metastatic to multiple sites [facility]) Patient states he has finished up radiation therapy.  Patient on apalutamide  (holding here).   Hyperlipidemia, unspecified Continue Lipitor   Anemia of chronic disease Hemoglobin stable. 8.6   Obesity, Class III, BMI 40-49.9 (morbid obesity) (HCC) BMI 40.31 with current height and weight in computer.   Body mass index is 40.31 kg/m.  VTE ppx:  lovenox   Diet:     Diet   Diet heart healthy/carb modified Room service appropriate? Yes; Fluid consistency: Thin   Consultants: Heme/onc  Subjective 08/03/24    Pt reports feeling well today. Has soreness in his buttocks and heel sores. No SOB, CP.    Objective  Blood pressure 107/63, pulse (!) 107, temperature 99.1 F (37.3 C), temperature source Oral, resp. rate 20, height 5' 11 (1.803 m), weight 131.1 kg, SpO2 93%.  Intake/Output Summary (Last 24 hours) at 08/03/2024 0718 Last data filed at 08/02/2024 1300 Gross per 24 hour  Intake --  Output 200 ml  Net -200 ml   Filed Weights   07/24/24 1550  Weight: 131.1 kg    Physical Exam:  General: awake, alert, NAD Respiratory: normal respiratory effort. Cardiovascular: extremities well perfused, quick capillary refill, normal S1/S2, RRR, no JVD, murmurs Nervous: A&O x3. no gross focal neurologic deficits, normal speech Skin: clean, dry dressing on  wounds of buttocks and heal Psychiatry: normal mood, congruent affect  Labs   I have personally reviewed the following labs and imaging studies CBC    Component Value Date/Time   WBC 0.7 (LL) 08/03/2024 0516   RBC 3.01 (L) 08/03/2024 0516   HGB 8.6 (L) 08/03/2024 0516   HGB 9.0 (L) 07/07/2024 1319   HCT 26.0 (L) 08/03/2024 0516    PLT 322 08/03/2024 0516   PLT 227 07/07/2024 1319   MCV 86.4 08/03/2024 0516   MCH 28.6 08/03/2024 0516   MCHC 33.1 08/03/2024 0516   RDW 17.2 (H) 08/03/2024 0516   LYMPHSABS 0.6 (L) 08/03/2024 0516   MONOABS 0.1 08/03/2024 0516   EOSABS 0.0 08/03/2024 0516   BASOSABS 0.0 08/03/2024 0516      Latest Ref Rng & Units 08/01/2024    4:29 AM 07/31/2024    4:21 AM 07/29/2024    5:43 AM  BMP  Glucose 70 - 99 mg/dL 882   883   BUN 8 - 23 mg/dL 11   11   Creatinine 9.38 - 1.24 mg/dL 9.26  9.23  9.27   Sodium 135 - 145 mmol/L 135   138   Potassium 3.5 - 5.1 mmol/L 4.5   4.2   Chloride 98 - 111 mmol/L 103   108   CO2 22 - 32 mmol/L 22   22   Calcium  8.9 - 10.3 mg/dL 8.1   7.7     CT BONE MARROW BIOPSY & ASPIRATION Result Date: 08/02/2024 INDICATION: [id number] Neutropenia, unspecified [id number] EXAM: CT GUIDED BONE MARROW ASPIRATION AND CORE BIOPSY MEDICATIONS: None. ANESTHESIA/SEDATION: Moderate (conscious) sedation was employed during this procedure. A total of Versed  2 mg and Fentanyl  100 mcg was administered intravenously. Moderate Sedation Time: 15 minutes. The patient's level of consciousness and vital signs were monitored continuously by radiology nursing throughout the procedure under my direct supervision. FLUOROSCOPY TIME:  CT dose in mGy was not provided. COMPLICATIONS: None immediate. Estimated blood loss: <5 mL PROCEDURE: RADIATION DOSE REDUCTION: This exam was performed according to the departmental dose-optimization program which includes automated exposure control, adjustment of the mA and/or kV according to patient size and/or use of iterative reconstruction technique. Informed written consent was obtained from the patient after a thorough discussion of the procedural risks, benefits and alternatives. All questions were addressed. Maximal Sterile Barrier Technique was utilized including caps, mask, sterile gowns, sterile gloves, sterile drape, hand hygiene and skin antiseptic. A timeout  was performed prior to the initiation of the procedure. The patient was positioned prone and non-contrast localization CT was performed of the pelvis to demonstrate the iliac marrow spaces. Under sterile conditions and local anesthesia, an 11 gauge coaxial bone biopsy needle was advanced into the LEFT iliac marrow space. Needle position was confirmed with CT imaging. Initially, bone marrow aspiration was performed. Next, the 11 gauge outer cannula was utilized to obtain a 1 iliac bone marrow core biopsy. Needle was removed. Hemostasis was obtained with compression. The patient tolerated the procedure well. Samples were prepared with the cytotechnologist. IMPRESSION: Successful CT-guided bone marrow aspiration and biopsy. Thom Hall, MD [facility] Radiology Electronically Signed   By: Thom Hall M.D.   On: 08/02/2024 11:38    Disposition Plan & Communication  Patient status: Inpatient  Admitted From: Home Planned disposition location: Skilled nursing facility Anticipated discharge date: 12/5 pending BMB results and treatment   Family Communication: none at bedside    Author: Marien LITTIE Piety, DO  [facility] 08/03/2024, 7:18 AM   Available by Epic secure chat 7AM-7PM. If 7PM-7AM, please contact night-coverage.  [facility] contact information found on [email or web address].

## 2024-08-03 NOTE — Plan of Care (Signed)
   Problem: Education: Goal: Knowledge of General Education information will improve Description: Including pain rating scale, medication(s)/side effects and non-pharmacologic comfort measures Outcome: Progressing   Problem: Health Behavior/Discharge Planning: Goal: Ability to manage health-related needs will improve Outcome: Progressing   Problem: Activity: Goal: Risk for activity intolerance will decrease Outcome: Progressing   Problem: Nutrition: Goal: Adequate nutrition will be maintained Outcome: Progressing   Problem: Coping: Goal: Level of anxiety will decrease Outcome: Progressing   Problem: Pain Managment: Goal: General experience of comfort will improve and/or be controlled Outcome: Progressing   Problem: Safety: Goal: Ability to remain free from injury will improve Outcome: Progressing   Problem: Skin Integrity: Goal: Risk for impaired skin integrity will decrease Outcome: Progressing

## 2024-08-03 NOTE — TOC Progression Note (Signed)
 Transition of Care [facility]) - Progression Note    Patient Details  Name: [name] MRN: [id number] Date of Birth: June 07, 1951  [facility] CM/SW Contact  Dalia GORMAN Fuse, RN Phone Number: 08/03/2024, 8:47 AM  Clinical Narrative:    Plan for patient to DC to [facility]. Neutrophil count is 0, waiting on Oncology to sign off.                     Expected Discharge Plan and Services                                               Social Drivers of Health (SDOH) Interventions SDOH Screenings   Food Insecurity: No Food Insecurity (07/29/2024)  Housing: High Risk (07/29/2024)  Transportation Needs: No Transportation Needs (07/29/2024)  Utilities: Not At Risk (07/29/2024)  Depression (PHQ2-9): Low Risk  (07/07/2024)  Financial Resource Strain: Low Risk  (08/28/2023)   Received [facility]  Recent Concern: Financial Resource Strain - Medium Risk (08/06/2023)   Received from [facility]  Physical Activity: Inactive (08/28/2023)   Received from [facility]  Social Connections: Moderately Integrated (07/29/2024)  Stress: No Stress Concern Present (08/28/2023)   Received from [facility]  Tobacco Use: Medium Risk (07/24/2024)  Health Literacy: Adequate Health Literacy (08/28/2023)   Received from [facility]    Readmission Risk Interventions     No data to display

## 2024-08-04 DIAGNOSIS — R5081 Fever presenting with conditions classified elsewhere: Secondary | ICD-10-CM | POA: Diagnosis not present

## 2024-08-04 DIAGNOSIS — E162 Hypoglycemia, unspecified: Secondary | ICD-10-CM | POA: Diagnosis not present

## 2024-08-04 DIAGNOSIS — D709 Neutropenia, unspecified: Secondary | ICD-10-CM | POA: Diagnosis not present

## 2024-08-04 LAB — CBC WITH DIFFERENTIAL/PLATELET
Abs Immature Granulocytes: 0 K/uL (ref 0.00–0.07)
Basophils Absolute: 0 K/uL (ref 0.0–0.1)
Basophils Relative: 1 %
Eosinophils Absolute: 0 K/uL (ref 0.0–0.5)
Eosinophils Relative: 0 %
HCT: 27.9 % — ABNORMAL LOW (ref 39.0–52.0)
Hemoglobin: 8.9 g/dL — ABNORMAL LOW (ref 13.0–17.0)
Immature Granulocytes: 0 %
Lymphocytes Relative: 50 %
Lymphs Abs: 0.6 K/uL — ABNORMAL LOW (ref 0.7–4.0)
MCH: 28.2 pg (ref 26.0–34.0)
MCHC: 31.9 g/dL (ref 30.0–36.0)
MCV: 88.3 fL (ref 80.0–100.0)
Monocytes Absolute: 0.5 K/uL (ref 0.1–1.0)
Monocytes Relative: 46 %
Neutro Abs: 0 K/uL — CL (ref 1.7–7.7)
Neutrophils Relative %: 3 %
Platelets: 330 K/uL (ref 150–400)
RBC: 3.16 MIL/uL — ABNORMAL LOW (ref 4.22–5.81)
RDW: 17.6 % — ABNORMAL HIGH (ref 11.5–15.5)
Smear Review: NORMAL
WBC: 1.1 K/uL — CL (ref 4.0–10.5)
nRBC: 0 % (ref 0.0–0.2)

## 2024-08-04 LAB — GLUCOSE, CAPILLARY
Glucose-Capillary: 117 mg/dL — ABNORMAL HIGH (ref 70–99)
Glucose-Capillary: 126 mg/dL — ABNORMAL HIGH (ref 70–99)
Glucose-Capillary: 142 mg/dL — ABNORMAL HIGH (ref 70–99)
Glucose-Capillary: 127 mg/dL — ABNORMAL HIGH (ref 70–99)
Glucose-Capillary: 102 mg/dL — ABNORMAL HIGH (ref 70–99)

## 2024-08-04 MED ORDER — SODIUM CHLORIDE 0.9 % IV SOLN: INTRAVENOUS | Status: AC

## 2024-08-04 NOTE — Progress Notes (Signed)
 PROGRESS NOTE [name]    DOB: [birth year], 72 y.o.  FMW:[id number]    Code Status: Full Code   DOA: 07/24/2024   LOS: 10  Brief hospital course  [name] is a 73 y.o. male with a PMH significant for metastatic prostate cancer, type 2 diabetes mellitus, arthritis, hypertension and hyperlipidemia. He presents to the hospital with low sugar. He states his sister found him on the floor. His sugar was 36. He reports significantly increased use of insulin  from his prescribed doses.  Started on D5w and has since recovered with good glucose control with oral meds alone.  In addition, developed neutropenic fever. Oncology consulted. Underwent BMB 12/2. Awaiting results. Receiving Granix  treatments to support WBC but not responding well. Overall appears to be stable and will be going to SNF once definitive dx and treatment of leukopenia completed/stabilized.   08/04/24 -grannix tx, BMB results pending. Oncology following. Patient feels he is improving clinically   Assessment & Plan  Principal Problem:   Hypoglycemia Active Problems:   Diabetic foot infection (HCC)   Leukopenia   Decubitus ulcer of left heel, unstageable (HCC)   Hypotension   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)   Hyperlipidemia, unspecified   Anemia of chronic disease   Obesity, Class III, BMI 40-49.9 (morbid obesity) (HCC)   Pressure injury   Pressure injury of buttock, stage 2 (HCC)  Diabetes Mellitis in remission. Hgb A1c 5.6 presenting with severe Hypoglycemia due to overuse of insulin .  S/p d5w gtt - discontinue insulin  use.  - Tradjenta  and metformin  to maintain sugars.    Leukopenia Febrile illness with leukopenia. WBC of 1.1 and neutrophil of 0 today.  No obvious infection found, RVP was negative.  Remains afebrile - s/p BMB 12/2. Results pending - heme/onc following, appreciate your recs - repeat granix  today - CBC diff am   Bilateral buttocks stage II  Decubitus ulcer of left  heel, unstageable (HCC) Diabetic foot infection- MRI does not show any osteomyelitis.   Superficial wound cultures with corynebacterium. - WOC consulted     Hypotension Blood pressure improved from admission.  Hold antihypertensive medication. - given course of IVF   Prostate cancer metastatic to multiple sites [facility]) Patient states he has finished up radiation therapy.  Patient on apalutamide  (holding here).   Hyperlipidemia, unspecified Continue Lipitor   Anemia of chronic disease Hemoglobin stable. 8.6   Obesity, Class III, BMI 40-49.9 (morbid obesity) (HCC) BMI 40.31 with current height and weight in computer.   Body mass index is 40.31 kg/m.  VTE ppx:  lovenox   Diet:     Diet   Diet heart healthy/carb modified Room service appropriate? Yes; Fluid consistency: Thin   Consultants: Heme/onc  Subjective 08/04/24    Pt reports feeling well today. Denies complaints.    Objective  Blood pressure 107/63, pulse (!) 107, temperature 99.1 F (37.3 C), temperature source Oral, resp. rate 20, height 5' 11 (1.803 m), weight 131.1 kg, SpO2 93%.  Intake/Output Summary (Last 24 hours) at 08/04/2024 0716 Last data filed at 08/04/2024 0501 Gross per 24 hour  Intake 240 ml  Output 400 ml  Net -160 ml   Filed Weights   07/24/24 1550  Weight: 131.1 kg    Physical Exam:  General: awake, alert, NAD Respiratory: normal respiratory effort. Cardiovascular: extremities well perfused, quick capillary refill, normal S1/S2, RRR, no JVD, murmurs Nervous: A&O x3. no gross focal neurologic deficits, normal speech Skin: clean, dry dressing on wounds of buttocks  and heal Psychiatry: normal mood, congruent affect  Labs   I have personally reviewed the following labs and imaging studies CBC    Component Value Date/Time   WBC 1.1 (LL) 08/04/2024 0519   RBC 3.16 (L) 08/04/2024 0519   HGB 8.9 (L) 08/04/2024 0519   HGB 9.0 (L) 07/07/2024 1319   HCT 27.9 (L) 08/04/2024 0519   PLT 330  08/04/2024 0519   PLT 227 07/07/2024 1319   MCV 88.3 08/04/2024 0519   MCH 28.2 08/04/2024 0519   MCHC 31.9 08/04/2024 0519   RDW 17.6 (H) 08/04/2024 0519   LYMPHSABS 0.6 (L) 08/04/2024 0519   MONOABS 0.5 08/04/2024 0519   EOSABS 0.0 08/04/2024 0519   BASOSABS 0.0 08/04/2024 0519      Latest Ref Rng & Units 08/01/2024    4:29 AM 07/31/2024    4:21 AM 07/29/2024    5:43 AM  BMP  Glucose 70 - 99 mg/dL 882   883   BUN 8 - 23 mg/dL 11   11   Creatinine 9.38 - 1.24 mg/dL 9.26  9.23  9.27   Sodium 135 - 145 mmol/L 135   138   Potassium 3.5 - 5.1 mmol/L 4.5   4.2   Chloride 98 - 111 mmol/L 103   108   CO2 22 - 32 mmol/L 22   22   Calcium  8.9 - 10.3 mg/dL 8.1   7.7     CT BONE MARROW BIOPSY & ASPIRATION Result Date: 08/02/2024 INDICATION: [id number] Neutropenia, unspecified [id number] EXAM: CT GUIDED BONE MARROW ASPIRATION AND CORE BIOPSY MEDICATIONS: None. ANESTHESIA/SEDATION: Moderate (conscious) sedation was employed during this procedure. A total of Versed  2 mg and Fentanyl  100 mcg was administered intravenously. Moderate Sedation Time: 15 minutes. The patient's level of consciousness and vital signs were monitored continuously by radiology nursing throughout the procedure under my direct supervision. FLUOROSCOPY TIME:  CT dose in mGy was not provided. COMPLICATIONS: None immediate. Estimated blood loss: <5 mL PROCEDURE: RADIATION DOSE REDUCTION: This exam was performed according to the departmental dose-optimization program which includes automated exposure control, adjustment of the mA and/or kV according to patient size and/or use of iterative reconstruction technique. Informed written consent was obtained from the patient after a thorough discussion of the procedural risks, benefits and alternatives. All questions were addressed. Maximal Sterile Barrier Technique was utilized including caps, mask, sterile gowns, sterile gloves, sterile drape, hand hygiene and skin antiseptic. A timeout was  performed prior to the initiation of the procedure. The patient was positioned prone and non-contrast localization CT was performed of the pelvis to demonstrate the iliac marrow spaces. Under sterile conditions and local anesthesia, an 11 gauge coaxial bone biopsy needle was advanced into the LEFT iliac marrow space. Needle position was confirmed with CT imaging. Initially, bone marrow aspiration was performed. Next, the 11 gauge outer cannula was utilized to obtain a 1 iliac bone marrow core biopsy. Needle was removed. Hemostasis was obtained with compression. The patient tolerated the procedure well. Samples were prepared with the cytotechnologist. IMPRESSION: Successful CT-guided bone marrow aspiration and biopsy. Thom Hall, MD [facility] Radiology Electronically Signed   By: Thom Hall M.D.   On: 08/02/2024 11:38    Disposition Plan & Communication  Patient status: Inpatient  Admitted From: Home Planned disposition location: Skilled nursing facility Anticipated discharge date: 12/5 pending BMB results and treatment   Family Communication: none at bedside    Author: Marien LITTIE Piety, DO [facility] 08/04/2024,  7:16 AM   Available by Epic secure chat 7AM-7PM. If 7PM-7AM, please contact night-coverage.  [facility] contact information found on [email or web address].

## 2024-08-04 NOTE — Plan of Care (Signed)

## 2024-08-04 NOTE — Plan of Care (Signed)

## 2024-08-04 NOTE — Progress Notes (Signed)
 Mobility Specialist - Progress Note   08/04/24 1100  Mobility  Activity Pivoted/transferred from bed to chair;Respositioned in chair  Level of Assistance Standby assist, set-up cues, supervision of patient - no hands on  Assistive Device Front wheel walker  Distance Ambulated (ft) 4 ft  Range of Motion/Exercises Active;All extremities  RLE Weight Bearing Per Provider Order WBAT  LLE Weight Bearing Per Provider Order WBAT  Activity Response Tolerated well  Mobility visit 1 Mobility  Mobility Specialist Start Time (ACUTE ONLY) [id number]  Mobility Specialist Stop Time (ACUTE ONLY) 0942  Mobility Specialist Time Calculation (min) (ACUTE ONLY) 18 min   Pt was at the EOB on RA upon entry. Pt agreed to mobility. Pt is able today to STS with minA CGA. Pt is able today to ambulate. Pt did experience discomfort. Pt is repositioned in the recliner with needs in reach and chair alarm on.  Clem Rodes Mobility Specialist 08/04/24, 11:22 AM

## 2024-08-04 NOTE — Progress Notes (Signed)
 Occupational Therapy Treatment Patient Details Name: [name] MRN: [id number] DOB: 02/16/1951 Today's Date: 08/04/2024   History of present illness Pt is a 73 year old male admitted with hypoglycemia, diabetic foot infection, decubitus ulcer of L heel, hypotension     PMH significant for metastatic prostate cancer, type 2 diabetes mellitus, arthritis, hypertension and hyperlipidemia.   OT comments  Pt is seated in recliner on arrival. Pleasant, motivated and agreeable to OT session. He reports pain to his L heel to touch. Pt performed 2 bouts of standing from recliner with Min/Mod Ax2 with cues for hand placement, anterior weight shift. He was able to progress ambulation to ~5 ft and additional 8 ft using RW with assist to maintain standing balance and close chair follow from OT to maximize safety. Pt fatigues very easily with minimal activity and continues to require Max A for LB ADL management. Edu on importance of BUE/BLE exercises to maximize strength/endurance to prevent further weakness. Pt returned to recliner with all needs in place and will cont to require skilled acute OT services to maximize his safety and IND to return to PLOF.       If plan is discharge home, recommend the following:  A lot of help with walking and/or transfers;A lot of help with bathing/dressing/bathroom;Assistance with cooking/housework;Help with stairs or ramp for entrance   Equipment Recommendations  Other (comment) (defer to next venue)    Recommendations for Other Services      Precautions / Restrictions Precautions Precautions: Fall Recall of Precautions/Restrictions: Intact Restrictions RLE Weight Bearing Per Provider Order: Weight bearing as tolerated LLE Weight Bearing Per Provider Order: Weight bearing as tolerated       Mobility Bed Mobility               General bed mobility comments: NT up in recliner pre/post session    Transfers Overall transfer level: Needs  assistance Equipment used: Rolling walker (2 wheels) Transfers: Sit to/from Stand Sit to Stand: Mod assist, +2 physical assistance, Min assist           General transfer comment: 2 standing bouts from recliner performed with cues for hand placement and anterior weight shift, progressed to short bouts of ambulation ~5-8 ft at a time before requiring rest breaks     Balance Overall balance assessment: Needs assistance, History of Falls Sitting-balance support: No upper extremity supported, Feet supported Sitting balance-Leahy Scale: Fair     Standing balance support: Bilateral upper extremity supported, During functional activity, Reliant on assistive device for balance Standing balance-Leahy Scale: Poor Standing balance comment: RW and external support with close chair follow                           ADL either performed or assessed with clinical judgement   ADL Overall ADL's : Needs assistance/impaired                     Lower Body Dressing: Maximal assistance Lower Body Dressing Details (indicate cue type and reason): donn socks                    Extremity/Trunk Assessment              Vision       Perception     Praxis     Communication Communication Communication: No apparent difficulties   Cognition Arousal: Alert Behavior During Therapy: [facility] for tasks assessed/performed  Following commands: Intact        Cueing   Cueing Techniques: Verbal cues, Tactile cues  Exercises Other Exercises Other Exercises: Edu on performing BLE exercises while seated in recliner to maximize strength and prevent further decline.    Shoulder Instructions       General Comments fatigues easily with minimal activity with increased HR noted; L heel tender and nursing notified of need for dressing attention    Pertinent Vitals/ Pain       Pain Assessment Pain Assessment: Faces Faces Pain Scale:  Hurts little more Pain Location: L heel to touch Pain Descriptors / Indicators: Tender, Sore Pain Intervention(s): Limited activity within patient's tolerance, Monitored during session, Repositioned  Home Living                                          Prior Functioning/Environment              Frequency  Min 2X/week        Progress Toward Goals  OT Goals(current goals can now be found in the care plan section)  Progress towards OT goals: Progressing toward goals  Acute Rehab OT Goals Patient Stated Goal: go home OT Goal Formulation: With patient Time For Goal Achievement: 08/09/24 Potential to Achieve Goals: Fair  Plan      Co-evaluation                 AM-PAC OT 6 Clicks Daily Activity     Outcome Measure   Help from another person eating meals?: None Help from another person taking care of personal grooming?: A Little Help from another person toileting, which includes using toliet, bedpan, or urinal?: A Lot Help from another person bathing (including washing, rinsing, drying)?: A Lot Help from another person to put on and taking off regular upper body clothing?: A Little Help from another person to put on and taking off regular lower body clothing?: A Lot 6 Click Score: 16    End of Session Equipment Utilized During Treatment: Rolling walker (2 wheels);Gait belt  OT Visit Diagnosis: Other abnormalities of gait and mobility (R26.89);Muscle weakness (generalized) (M62.81)   Activity Tolerance Patient tolerated treatment well   Patient Left with call bell/phone within reach;in chair;with chair alarm set   Nurse Communication Mobility status        Time: 8848-8784 OT Time Calculation (min): 24 min  Charges: OT General Charges $OT Visit: 1 Visit OT Treatments $Therapeutic Activity: 8-22 mins  Kashis Penley Chrismon, OTR/L  08/04/24, 3:01 PM   Joselle Deeds E Chrismon 08/04/2024, 2:56 PM

## 2024-08-04 NOTE — TOC Progression Note (Signed)
 Transition of Care [facility]) - Progression Note    Patient Details  Name: [name] MRN: [id number] Date of Birth: 1951-02-08  Transition of [facility] CM/SW Contact  Dalia GORMAN Fuse, RN Phone Number: 08/04/2024, 9:17 AM  Clinical Narrative:    Patient remains inpat awaiting bx results and onc sign-off. Patient has a bed at [facility], it will be restarted when the patient is medically appropriate. TOC will continue to follow.                    Expected Discharge Plan and Services                                               Social Drivers of Health (SDOH) Interventions SDOH Screenings   Food Insecurity: No Food Insecurity (07/29/2024)  Housing: High Risk (07/29/2024)  Transportation Needs: No Transportation Needs (07/29/2024)  Utilities: Not At Risk (07/29/2024)  Depression (PHQ2-9): Low Risk  (07/07/2024)  Financial Resource Strain: Low Risk  (08/28/2023)   Received from [facility]  Recent Concern: Financial Resource Strain - Medium Risk (08/06/2023)   [facility] from [facility]  Physical Activity: Inactive (08/28/2023)   Received from [facility]  Social Connections: Moderately Integrated (07/29/2024)  Stress: No Stress Concern Present (08/28/2023)   Received from [facility]  Tobacco Use: Medium Risk (07/24/2024)  Health Literacy: Adequate Health Literacy (08/28/2023)   Received from [facility]    Readmission Risk Interventions     No data to display

## 2024-08-04 NOTE — Progress Notes (Signed)
 Physical Therapy Treatment Patient Details Name: [name] MRN: [id number] DOB: [birth year] Today's Date: 08/04/2024   History of Present Illness Pt is a 73 year old male admitted with hypoglycemia, diabetic foot infection, decubitus ulcer of L heel, hypotension     PMH significant for metastatic prostate cancer, type 2 diabetes mellitus, arthritis, hypertension and hyperlipidemia.    PT Comments  Pt received up in recliner for co-tx with OT in order to safely progress functional mobility. Pt with c/o L heel pain during light palpation, dressing poorly intact, nursing notified and will assess. Session focused on transfer training requiring Mod/MinA x 2 to power up from recliner to RW. Pt progressed gait training with chair follow 10' x 1, 15' x 1 with close CGA, no LOB or buckling, Increased fatique and HR to 136bpm from resting rate of 100. Pt remains weak, struggles with wt bearing on R heel only as recommended by podiatry. Therapist returned in pm to give pt bilateral post op shoes to protect wounds and improve standing balance.    If plan is discharge home, recommend the following: A lot of help with walking and/or transfers;A lot of help with bathing/dressing/bathroom;Assistance with cooking/housework;Direct supervision/assist for medications management;Direct supervision/assist for financial management;Assist for transportation;Help with stairs or ramp for entrance;Supervision due to cognitive status   Can travel by private vehicle     No  Equipment Recommendations  Other (comment)    Recommendations for Other Services       Precautions / Restrictions Precautions Precautions: Fall Recall of Precautions/Restrictions: Intact Restrictions Weight Bearing Restrictions Per Provider Order: Yes RLE Weight Bearing Per Provider Order: Weight bearing as tolerated LLE Weight Bearing Per Provider Order: Weight bearing as tolerated Other Position/Activity Restrictions:  (R LE Heel wt  bearing per podiatry note. Bilateral post op shoes given to pt to protect wounds)     Mobility  Bed Mobility               General bed mobility comments: NT up in recliner pre/post session    Transfers Overall transfer level: Needs assistance Equipment used: Rolling walker (2 wheels) Transfers: Sit to/from Stand Sit to Stand: Mod assist, Min assist, +2 physical assistance           General transfer comment:  (Rocking for momentum in order to power up to standing)    Ambulation/Gait Ambulation/Gait assistance: Min assist, Contact guard assist Gait Distance (Feet):  (15) Assistive device: Rolling walker (2 wheels) Gait Pattern/deviations: Step-to pattern, Wide base of support, Trunk flexed, Knee flexed in stance - right, Knee flexed in stance - left Gait velocity: decreased     General Gait Details: 10' x 1, 15' x 2   Stairs             Wheelchair Mobility     Tilt Bed    Modified Rankin (Stroke Patients Only)       Balance Overall balance assessment: Needs assistance, History of Falls Sitting-balance support: No upper extremity supported, Feet supported Sitting balance-Leahy Scale: Fair     Standing balance support: Bilateral upper extremity supported, During functional activity, Reliant on assistive device for balance Standing balance-Leahy Scale: Poor Standing balance comment: RW and external support                            Communication Communication Communication: No apparent difficulties  Cognition Arousal: Alert Behavior During Therapy: WFL for tasks assessed/performed   PT - Cognitive impairments:  No apparent impairments                       PT - Cognition Comments: Very pleasant and agreeable to session however lacks awareness of his deficits and safety concerns Following commands: Intact      Cueing Cueing Techniques: Verbal cues, Tactile cues  Exercises Other Exercises Other Exercises: edu re role of  PT, role of rehab, discharge recommendations    General Comments General comments (skin integrity, edema, etc.): fatigues easily with minimal activity with increased HR noted; L heel tender and nursing notified of need for dressing attention      Pertinent Vitals/Pain Pain Assessment Pain Assessment: Faces Faces Pain Scale: Hurts little more Pain Location:  (L heel, Right foot) Pain Descriptors / Indicators: Tender, Sore Pain Intervention(s): Limited activity within patient's tolerance    Home Living                          Prior Function            PT Goals (current goals can now be found in the care plan section) Acute Rehab PT Goals Patient Stated Goal: get to a better rehab this time Progress towards PT goals: Progressing toward goals    Frequency    Min 2X/week      PT Plan      Co-evaluation PT/OT/SLP Co-Evaluation/Treatment: Yes   PT goals addressed during session: Mobility/safety with mobility;Balance;Proper use of DME OT goals addressed during session: Proper use of Adaptive equipment and DME;ADL's and self-care      AM-PAC PT 6 Clicks Mobility   Outcome Measure  Help needed turning from your back to your side while in a flat bed without using bedrails?: A Lot Help needed moving from lying on your back to sitting on the side of a flat bed without using bedrails?: A Lot Help needed moving to and from a bed to a chair (including a wheelchair)?: A Lot Help needed standing up from a chair using your arms (e.g., wheelchair or bedside chair)?: A Lot Help needed to walk in hospital room?: Total Help needed climbing 3-5 steps with a railing? : Total 6 Click Score: 10    End of Session Equipment Utilized During Treatment: Gait belt Activity Tolerance: Patient tolerated treatment well Patient left: in chair;with call bell/phone within reach;with chair alarm set Nurse Communication: Mobility status PT Visit Diagnosis: Repeated falls  (R29.6);Muscle weakness (generalized) (M62.81);History of falling (Z91.81);Difficulty in walking, not elsewhere classified (R26.2);Pain Pain - Right/Left: Right Pain - part of body: Ankle and joints of foot     Time: 8848-8785 PT Time Calculation (min) (ACUTE ONLY): 23 min  Charges:    $Therapeutic Activity: 8-22 mins PT General Charges $$ ACUTE PT VISIT: 1 Visit                    Darice Bohr, PTA  Darice JAYSON Bohr 08/04/2024, 2:57 PM

## 2024-08-05 ENCOUNTER — Inpatient Hospital Stay: Attending: Internal Medicine

## 2024-08-05 ENCOUNTER — Other Ambulatory Visit: Payer: Self-pay

## 2024-08-05 ENCOUNTER — Inpatient Hospital Stay: Admitting: Internal Medicine

## 2024-08-05 ENCOUNTER — Inpatient Hospital Stay

## 2024-08-05 DIAGNOSIS — E162 Hypoglycemia, unspecified: Secondary | ICD-10-CM | POA: Diagnosis not present

## 2024-08-05 DIAGNOSIS — L89620 Pressure ulcer of left heel, unstageable: Secondary | ICD-10-CM | POA: Diagnosis not present

## 2024-08-05 DIAGNOSIS — D709 Neutropenia, unspecified: Secondary | ICD-10-CM | POA: Diagnosis not present

## 2024-08-05 DIAGNOSIS — E538 Deficiency of other specified B group vitamins: Secondary | ICD-10-CM | POA: Diagnosis not present

## 2024-08-05 DIAGNOSIS — C61 Malignant neoplasm of prostate: Secondary | ICD-10-CM | POA: Diagnosis not present

## 2024-08-05 DIAGNOSIS — D708 Other neutropenia: Secondary | ICD-10-CM | POA: Diagnosis not present

## 2024-08-05 DIAGNOSIS — E11628 Type 2 diabetes mellitus with other skin complications: Secondary | ICD-10-CM | POA: Diagnosis not present

## 2024-08-05 LAB — CBC WITH DIFFERENTIAL/PLATELET
Abs Immature Granulocytes: 0 K/uL (ref 0.00–0.07)
Basophils Absolute: 0 K/uL (ref 0.0–0.1)
Basophils Relative: 1 %
Eosinophils Absolute: 0 K/uL (ref 0.0–0.5)
Eosinophils Relative: 0 %
HCT: 25.3 % — ABNORMAL LOW (ref 39.0–52.0)
Hemoglobin: 8.3 g/dL — ABNORMAL LOW (ref 13.0–17.0)
Immature Granulocytes: 0 %
Lymphocytes Relative: 46 %
Lymphs Abs: 0.4 K/uL — ABNORMAL LOW (ref 0.7–4.0)
MCH: 28.5 pg (ref 26.0–34.0)
MCHC: 32.8 g/dL (ref 30.0–36.0)
MCV: 86.9 fL (ref 80.0–100.0)
Monocytes Absolute: 0.4 K/uL (ref 0.1–1.0)
Monocytes Relative: 52 %
Neutro Abs: 0 K/uL — CL (ref 1.7–7.7)
Neutrophils Relative %: 1 %
Platelets: 335 K/uL (ref 150–400)
RBC: 2.91 MIL/uL — ABNORMAL LOW (ref 4.22–5.81)
RDW: 17.3 % — ABNORMAL HIGH (ref 11.5–15.5)
Smear Review: NORMAL
WBC: 0.9 K/uL — CL (ref 4.0–10.5)
nRBC: 0 % (ref 0.0–0.2)

## 2024-08-05 LAB — GLUCOSE, CAPILLARY: Glucose-Capillary: 112 mg/dL — ABNORMAL HIGH (ref 70–99)

## 2024-08-05 MED ORDER — VITAMIN B-12 1000 MCG PO TABS
1000.0000 ug | ORAL_TABLET | Freq: Every day | ORAL | Status: DC
  Administered 2024-08-06 – 2024-08-12 (×7): 1000 ug via ORAL
  Filled 2024-08-05 (×7): qty 1

## 2024-08-05 MED ORDER — CYANOCOBALAMIN 1000 MCG/ML IJ SOLN
1000.0000 ug | Freq: Once | INTRAMUSCULAR | Status: AC
  Administered 2024-08-05: 1000 ug via INTRAMUSCULAR
  Filled 2024-08-05: qty 1

## 2024-08-05 MED ORDER — MIDODRINE HCL 5 MG PO TABS
5.0000 mg | ORAL_TABLET | Freq: Three times a day (TID) | ORAL | Status: DC
  Administered 2024-08-05 – 2024-08-12 (×22): 5 mg via ORAL
  Filled 2024-08-05 (×22): qty 1

## 2024-08-05 NOTE — Assessment & Plan Note (Signed)
 IM B12 followed by oral B12

## 2024-08-05 NOTE — Progress Notes (Signed)
 [name]   DOB:[birth year]   FM#:[id number]    Subjective: Patient sitting in a chair.  Denies any fevers or chills.  Appetite fair.  No nausea or vomiting.  Objective:  Vitals:   08/05/24 1729 08/05/24 1936  BP: (!) 93/49 (!) 119/41  Pulse: 99 (!) 101  Resp: 18 17  Temp: 100.1 F (37.8 C) 98.6 F (37 C)  SpO2: 93% 98%     Intake/Output Summary (Last 24 hours) at 08/06/2024 0056 Last data filed at 08/05/2024 2102 Gross per 24 hour  Intake 240 ml  Output 702 ml  Net -462 ml    Physical Exam Vitals and nursing note reviewed.  HENT:     Head: Normocephalic and atraumatic.     Mouth/Throat:     Pharynx: Oropharynx is clear.  Eyes:     Extraocular Movements: Extraocular movements intact.     Pupils: Pupils are equal, round, and reactive to light.  Cardiovascular:     Rate and Rhythm: Normal rate and regular rhythm.  Pulmonary:     Comments: Decreased breath sounds bilaterally.  Abdominal:     Palpations: Abdomen is soft.  Musculoskeletal:        General: Normal range of motion.     Cervical back: Normal range of motion.  Skin:    General: Skin is warm.  Neurological:     General: No focal deficit present.     Mental Status: He is alert and oriented to person, place, and time.  Psychiatric:        Behavior: Behavior normal.        Judgment: Judgment normal.      Labs:  Lab Results  Component Value Date   WBC 0.9 (LL) 08/05/2024   HGB 8.3 (L) 08/05/2024   HCT 25.3 (L) 08/05/2024   MCV 86.9 08/05/2024   PLT 335 08/05/2024   NEUTROABS 0.0 (LL) 08/05/2024    Lab Results  Component Value Date   NA 135 08/01/2024   K 4.5 08/01/2024   CL 103 08/01/2024   CO2 22 08/01/2024    Studies:  No results found.   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  Incidentally noted to have worsening leukopenia neutropenia and anemia.   #  Worsening leukopenia/neutropenia-anemia etiology is unclear.  August 02, 2024 bone marrow biopsy-no obvious evidence of MDS or leukemia to explain the significant leukopenia/neutropenia.  Suspect acute bone marrow insult-likely viral infection.  Bone marrow also notes to have involvement by prostate cancer-however clinically not suggestive of cause of patient's neutropenia. patient has not significantly improved even on Granix .  However recommend continue Granix  over the weekend.  B12 low recommend B12 injections once a month.  Check DIC panel-although low clinical suspicion for DIC.  Check CRP-    # Metastatic prostate cancer-castrate sensitive with bone metastases-status post Eligard; Erleda on hold given patient's-significant neutropenia.  Will check repeat PSA.   # Multiple other medical problems including diabetes-labile blood sugars; history of diabetic foot infections/cubitus ulcers etc.   # However plan of care was discussed with hospitalist service, Dr.Weiting.   Adel Neyer R Tela Kotecki, MD

## 2024-08-05 NOTE — TOC Progression Note (Signed)
 Transition of Care [facility]) - Progression Note    Patient Details  Name: [name] MRN: [id number] Date of Birth: Apr 16, 1951  Transition of Care [facility]) CM/SW Contact  Dalia GORMAN Fuse, RN Phone Number: 08/05/2024, 1:37 PM  Clinical Narrative:    Patient remains inpat awaiting bx results and onc sign-off. Patient has a bed at [facility], shara will be restarted when the patient is medically appropriate. TOC will continue to follow.                      Expected Discharge Plan and Services                                               Social Drivers of Health (SDOH) Interventions SDOH Screenings   Food Insecurity: No Food Insecurity (07/29/2024)  Housing: High Risk (07/29/2024)  Transportation Needs: No Transportation Needs (07/29/2024)  Utilities: Not At Risk (07/29/2024)  Depression (PHQ2-9): Low Risk  (07/07/2024)  Financial Resource Strain: Low Risk  (08/28/2023)   Received from [facility]  Recent Concern: Financial Resource Strain - Medium Risk (08/06/2023)   Received from [facility]  Physical Activity: Inactive (08/28/2023)   Received from [facility]  Social Connections: Moderately Integrated (07/29/2024)  Stress: No Stress Concern Present (08/28/2023)   Received from [facility]  Tobacco Use: Medium Risk (07/24/2024)  Health Literacy: Adequate Health Literacy (08/28/2023)   Received from [facility]    Readmission Risk Interventions     No data to display

## 2024-08-05 NOTE — Plan of Care (Signed)

## 2024-08-05 NOTE — Progress Notes (Signed)
 Progress Note   Patient: [name] FMW:[id number] DOB: April 03, 1951 DOA: 07/24/2024     11 DOS: the patient was seen and examined on 08/05/2024   Brief hospital course: 73 y.o. male with medical history significant of metastatic prostate cancer, type 2 diabetes mellitus, arthritis, hypertension and hyperlipidemia.  He presents to the hospital with low sugar.  He states his sister found him on the floor.  His sugar was 36.  Feels a little weak.  He states that he has been taking around 20 units of Lantus  and supposed to take 10.  He has also been taking 12 units of Humalog and was supposed to take 4.  He states he cannot see very well.  He draws up his own insulin  and injects his own insulin .  When he has a low sugar he feels out of it.  In the ER his sugar was in the 50s and 60s.  Hospitalist services contacted for further evaluation.  Patient also states he has an ulcer of his right foot that has been draining a little bit.  No fever chills or sweats.  He finished up radiation treatment for metastatic prostate cancer.  Follows with Dr. Ashley for his foot.  Patient put on D5W and IV antibiotics.  11/24.  Patient taken off D5W drip and will watch sugars another day.  Concerned about his vision and drawing up insulin .  Wondering if I can do pills instead.  MRI right foot did not show osteomyelitis.  Podiatry saw patient and recommended outpatient follow-up. 11/25.  Physical therapy recommending rehab from yesterday.  Case discussed with diabetes coordinator and will do Tradjenta  and Glucophage  and get rid of insulin .  Podiatry was okay with how the wounds look.  Will need close follow-up as outpatient. 11/25 continued.  Patient spiked a fever of 100.7.  Treat as neutropenic fever.  Blood cultures, urine culture and chest x-ray ordered.  Maxipime  ordered.  Continue doxycycline .  11/26: Maximum temperature recorded of 102.7 over the past 24-hour, worsening leukopenia at 0.9-ordered another dose of  Granix .  Preliminary blood cultures negative, checking respiratory viral panel, negative COVID, influenza and RSV.  Continuing cefepime  for now.  11/27: Afebrile over the past 24-hour, WBC at 1.0 and neutrophils of 0.  Discussed with Dr. Jacobo from oncology, who advised to continue Granix  until neutrophil of 1000.  Another dose of 480 mg ordered today.  Mild hypophosphatemia which is being repleted.  11/28: Remained afebrile.  WBC still at 1 with neutrophil of 0, continuing Granix , phosphorus improved to 2.4-giving another dose. Foot wound does not look infected.  11/29: Remained afebrile, WBC still at 0.9 with neutrophils of 0, case was discussed again with Dr. Jacobo who advised to discontinue antibiotics and continue with Granix , continue holding Erleada .  11/30: Remained afebrile and hemodynamically stable, slowly worsening leukopenia now WBC at 0.8, neutrophil remains 0.  Case was discussed again with Dr. Billi to continue with Granix  and he will discuss with his oncologist Dr. Rennie tomorrow as he might need bone marrow biopsy.  12/1: Remained afebrile but WBC still at 0.8 with 0 neutrophil.  Bone marrow biopsy scheduled for tomorrow.  12/2: Hemodynamically stable, no change in cell count.  S/p bone marrow biopsy today.  Insurance authorization obtained for SNF, message sent to Dr. Rennie when he thinks he will be safe to move as neutrophil remains 0.  12/5.  Blood pressure on the lower side today will start midodrine .  White blood cell count still low at  0.9 and neutrophil count remains at 0.  Assessment and Plan: * Leukopenia Spoke with oncology and recommends continuing Granix  at this point in time and once white count comes up can discharge.  Patient was treated for neutropenic fever earlier in the hospital course.   Hypoglycemia Likely secondary to taking too much insulin  and poor vision on drawing up his insulin .  Initially placed on D5 drip.  Taken off  D5 drip on 11/24.  Continue Tradjenta  and Glucophage  to maintain sugars.  Discontinue insulin .  Diabetic foot infection [facility]) Appreciate podiatry consultation.  MRI does not show any osteomyelitis.   Superficial wound cultures with corynebacterium.  Received antibiotics during the hospital course  Will need close follow-up as outpatient. Continue with wound care  Decubitus ulcer of left heel, unstageable (HCC) heel protector.  Hypotension Will start low-dose midodrine .  Hold blood pressure medication.  Prostate cancer metastatic to multiple sites [facility]) Patient states he has finished up radiation therapy.  Patient on apalutamide  (holding here).  Hyperlipidemia, unspecified Continue Lipitor  Anemia of chronic disease Hemoglobin 8.3  Obesity, Class III, BMI 40-49.9 (morbid obesity) (HCC) BMI 40.31 with only height and weight in computer.  Vitamin B12 deficiency IM B12 followed by oral B12  Pressure injury Wound 07/25/24 Pressure Injury Heel Left Unstageable - Full thickness tissue loss in which the base of the injury is covered by slough (yellow, tan, gray, green or brown) and/or eschar (tan, brown or black) in the wound bed. (Active)   Bilateral buttocks stage II with red moist macerated skin and patchy areas of partial-thickness loss consistent with moisture associated skin damage.  Both present on admission.           Subjective: Patient feeling okay.  States he walked a little bit.  Blood pressure on the lower side today.  Patient eating and drinking.  White blood cell count still low.  Physical Exam: Vitals:   08/05/24 0529 08/05/24 0532 08/05/24 0737 08/05/24 1101  BP: (!) 97/54  (!) 100/41 (!) 94/52  Pulse: (!) 107 (!) 110 96   Resp:   18   Temp:   99.3 F (37.4 C)   TempSrc:      SpO2: 99% 98% 97%   Weight:      Height:       Physical Exam HENT:     Head: Normocephalic.  Eyes:     General: Lids are normal.  Cardiovascular:     Rate and Rhythm:  Normal rate and regular rhythm.     Heart sounds: Normal heart sounds, S1 normal and S2 normal.  Pulmonary:     Breath sounds: No decreased breath sounds, wheezing, rhonchi or rales.  Abdominal:     Palpations: Abdomen is soft.     Tenderness: There is no abdominal tenderness.  Musculoskeletal:     Right lower leg: Swelling present.     Left lower leg: Swelling present.  Skin:    General: Skin is warm.     Comments: Chronic lower extremity skin discoloration.  Neurological:     Mental Status: He is alert.     Data Reviewed: White blood cell count 0.9 with neutrophil count of 0.  Hemoglobin 8.3  Family Communication: Spoke with sister on the phone  Disposition: Status is: Inpatient Remains inpatient appropriate because: Will need rehab  Planned Discharge Destination: Rehab    Time spent: 28 minutes  Author: Charlie Patterson, MD 08/05/2024 1:25 PM  For on call review [email or web address]

## 2024-08-06 DIAGNOSIS — L89620 Pressure ulcer of left heel, unstageable: Secondary | ICD-10-CM | POA: Diagnosis not present

## 2024-08-06 DIAGNOSIS — E11628 Type 2 diabetes mellitus with other skin complications: Secondary | ICD-10-CM | POA: Diagnosis not present

## 2024-08-06 DIAGNOSIS — E162 Hypoglycemia, unspecified: Secondary | ICD-10-CM | POA: Diagnosis not present

## 2024-08-06 DIAGNOSIS — D709 Neutropenia, unspecified: Secondary | ICD-10-CM | POA: Diagnosis not present

## 2024-08-06 LAB — CBC WITH DIFFERENTIAL/PLATELET
Abs Immature Granulocytes: 0.01 K/uL (ref 0.00–0.07)
Basophils Absolute: 0 K/uL (ref 0.0–0.1)
Basophils Relative: 1 %
Eosinophils Absolute: 0 K/uL (ref 0.0–0.5)
Eosinophils Relative: 0 %
HCT: 25 % — ABNORMAL LOW (ref 39.0–52.0)
Hemoglobin: 8.2 g/dL — ABNORMAL LOW (ref 13.0–17.0)
Immature Granulocytes: 1 %
Lymphocytes Relative: 43 %
Lymphs Abs: 0.4 K/uL — ABNORMAL LOW (ref 0.7–4.0)
MCH: 28.6 pg (ref 26.0–34.0)
MCHC: 32.8 g/dL (ref 30.0–36.0)
MCV: 87.1 fL (ref 80.0–100.0)
Monocytes Absolute: 0.4 K/uL (ref 0.1–1.0)
Monocytes Relative: 54 %
Neutro Abs: 0 K/uL — CL (ref 1.7–7.7)
Neutrophils Relative %: 1 %
Platelets: 380 K/uL (ref 150–400)
RBC: 2.87 MIL/uL — ABNORMAL LOW (ref 4.22–5.81)
RDW: 17.2 % — ABNORMAL HIGH (ref 11.5–15.5)
Smear Review: NORMAL
WBC: 0.8 K/uL — CL (ref 4.0–10.5)
nRBC: 0 % (ref 0.0–0.2)

## 2024-08-06 LAB — GLUCOSE, CAPILLARY
Glucose-Capillary: 121 mg/dL — ABNORMAL HIGH (ref 70–99)
Glucose-Capillary: 117 mg/dL — ABNORMAL HIGH (ref 70–99)
Glucose-Capillary: 133 mg/dL — ABNORMAL HIGH (ref 70–99)

## 2024-08-06 LAB — PROTIME-INR
INR: 1.4 — ABNORMAL HIGH (ref 0.8–1.2)
Prothrombin Time: 18 s — ABNORMAL HIGH (ref 11.4–15.2)

## 2024-08-06 LAB — FIBRINOGEN: Fibrinogen: 474 mg/dL (ref 210–475)

## 2024-08-06 LAB — PSA: Prostatic Specific Antigen: 63.51 ng/mL — ABNORMAL HIGH (ref 0.00–4.00)

## 2024-08-06 LAB — APTT: aPTT: 51 s — ABNORMAL HIGH (ref 24–36)

## 2024-08-06 LAB — C-REACTIVE PROTEIN: CRP: 10.7 mg/dL — ABNORMAL HIGH (ref <1.0)

## 2024-08-06 NOTE — Progress Notes (Signed)
 Progress Note   Patient: [name] FMW:[id number] DOB: [birth year] DOA: 07/24/2024     12 DOS: the patient was seen and examined on 08/06/2024   Brief hospital course: 73 y.o. male with medical history significant of metastatic prostate cancer, type 2 diabetes mellitus, arthritis, hypertension and hyperlipidemia.  He presents to the hospital with low sugar.  He states his sister found him on the floor.  His sugar was 36.  Feels a little weak.  He states that he has been taking around 20 units of Lantus  and supposed to take 10.  He has also been taking 12 units of Humalog and was supposed to take 4.  He states he cannot see very well.  He draws up his own insulin  and injects his own insulin .  When he has a low sugar he feels out of it.  In the ER his sugar was in the 50s and 60s.  Hospitalist services contacted for further evaluation.  Patient also states he has an ulcer of his right foot that has been draining a little bit.  No fever chills or sweats.  He finished up radiation treatment for metastatic prostate cancer.  Follows with Dr. Ashley for his foot.  Patient put on D5W and IV antibiotics.  11/24.  Patient taken off D5W drip and will watch sugars another day.  Concerned about his vision and drawing up insulin .  Wondering if I can do pills instead.  MRI right foot did not show osteomyelitis.  Podiatry saw patient and recommended outpatient follow-up. 11/25.  Physical therapy recommending rehab from yesterday.  Case discussed with diabetes coordinator and will do Tradjenta  and Glucophage  and get rid of insulin .  Podiatry was okay with how the wounds look.  Will need close follow-up as outpatient. 11/25 continued.  Patient spiked a fever of 100.7.  Treat as neutropenic fever.  Blood cultures, urine culture and chest x-ray ordered.  Maxipime  ordered.  Continue doxycycline .  11/26: Maximum temperature recorded of 102.7 over the past 24-hour, worsening leukopenia at 0.9-ordered another dose of  Granix .  Preliminary blood cultures negative, checking respiratory viral panel, negative COVID, influenza and RSV.  Continuing cefepime  for now.  11/27: Afebrile over the past 24-hour, WBC at 1.0 and neutrophils of 0.  Discussed with Dr. Jacobo from oncology, who advised to continue Granix  until neutrophil of 1000.  Another dose of 480 mg ordered today.  Mild hypophosphatemia which is being repleted.  11/28: Remained afebrile.  WBC still at 1 with neutrophil of 0, continuing Granix , phosphorus improved to 2.4-giving another dose. Foot wound does not look infected.  11/29: Remained afebrile, WBC still at 0.9 with neutrophils of 0, case was discussed again with Dr. Jacobo who advised to discontinue antibiotics and continue with Granix , continue holding Erleada .  11/30: Remained afebrile and hemodynamically stable, slowly worsening leukopenia now WBC at 0.8, neutrophil remains 0.  Case was discussed again with Dr. Billi to continue with Granix  and he will discuss with his oncologist Dr. Rennie tomorrow as he might need bone marrow biopsy.  12/1: Remained afebrile but WBC still at 0.8 with 0 neutrophil.  Bone marrow biopsy scheduled for tomorrow.  12/2: Hemodynamically stable, no change in cell count.  S/p bone marrow biopsy today.  Insurance authorization obtained for SNF, message sent to Dr. Rennie when he thinks he will be safe to move as neutrophil remains 0.  12/5.  Blood pressure on the lower side today will start midodrine .  White blood cell count still low at  0.9 and neutrophil count remains at 0. 12/6.  White blood cell count still low at 0.8 and neutrophil count remains at 0.  Assessment and Plan: * Leukopenia Spoke with oncology yesterday and recommends continuing filgrastim  at this point in time and once white count comes up can discharge.  Patient was treated for neutropenic fever earlier in the hospital course.  Patient had low-grade temperature yesterday.   Watch for fever.   Hypoglycemia On admission.  Likely secondary to taking too much insulin  and poor vision on drawing up his insulin .  Initially placed on D5 drip.  Taken off D5 drip on 11/24.  Continue Tradjenta  and Glucophage  to maintain sugars.  Patient will not need insulin  upon discharge.  Diabetic foot infection [facility]) Appreciate podiatry consultation.  MRI does not show any osteomyelitis.   Superficial wound cultures with corynebacterium.  Received antibiotics during the hospital course  Will need close follow-up as outpatient. Continue with wound care  Decubitus ulcer of left heel, unstageable (HCC) heel protector.  Hypotension Continue low-dose midodrine .  Hold blood pressure medication.  Prostate cancer metastatic to multiple sites [facility]) Patient states he has finished up radiation therapy.  Patient on apalutamide  (holding here).  Patient's PSA down to 63.51  Hyperlipidemia, unspecified Continue Lipitor  Anemia of chronic disease Hemoglobin 8.2  Obesity, Class III, BMI 40-49.9 (morbid obesity) (HCC) BMI 40.31 with only height and weight in computer.  Low serum vitamin B12 IM B12 followed by oral B12  Pressure injury Wound 07/25/24 Pressure Injury Heel Left Unstageable - Full thickness tissue loss in which the base of the injury is covered by slough (yellow, tan, gray, green or brown) and/or eschar (tan, brown or black) in the wound bed. (Active)   Bilateral buttocks stage II with red moist macerated skin and patchy areas of partial-thickness loss consistent with moisture associated skin damage.  Both present on admission.            Subjective: Patient feels okay.  Offers no complaints.  Had low-grade temperature yesterday.  No cough.  No burning on urination.  No diarrhea.  Physical Exam: Vitals:   08/05/24 1729 08/05/24 1936 08/06/24 0445 08/06/24 0838  BP: (!) 93/49 (!) 119/41 (!) 112/54 (!) 91/44  Pulse: 99 (!) 101 (!) 104 89  Resp: 18 17 18 16    Temp: 100.1 F (37.8 C) 98.6 F (37 C) 100.1 F (37.8 C) 97.8 F (36.6 C)  TempSrc: Oral     SpO2: 93% 98% 95% 94%  Weight:      Height:       Physical Exam HENT:     Head: Normocephalic.  Eyes:     General: Lids are normal.  Cardiovascular:     Rate and Rhythm: Normal rate and regular rhythm.     Heart sounds: Normal heart sounds, S1 normal and S2 normal.  Pulmonary:     Breath sounds: No decreased breath sounds, wheezing, rhonchi or rales.  Abdominal:     Palpations: Abdomen is soft.     Tenderness: There is no abdominal tenderness.  Musculoskeletal:     Right lower leg: Swelling present.     Left lower leg: Swelling present.  Skin:    General: Skin is warm.     Comments: Chronic lower extremity skin discoloration.  Neurological:     Mental Status: He is alert.     Data Reviewed: CRP 10.7, white blood cell count 0.8, neutrophil 0.0, hemoglobin 8.2, platelet count 380, INR 1.4, PSA 63.51 Family Communication:  Spoke with sister on the phone  Disposition: Status is: Inpatient Remains inpatient appropriate because: Need to watch white blood cell count, prior to disposition.  Watch closely for fever.  Planned Discharge Destination: Rehab    Time spent: 28 minutes  Author: Charlie Patterson, MD 08/06/2024 11:26 AM  For on call review [email or web address]

## 2024-08-06 NOTE — Plan of Care (Signed)

## 2024-08-06 NOTE — Progress Notes (Signed)
 Mobility Specialist - Progress Note     08/06/24 1524  Mobility  Activity Ambulated with assistance;Stood at bedside  Level of Assistance +2 (takes two people)  Assistive Device [facility]  Distance Ambulated (ft) 12 ft  Range of Motion/Exercises Active  Activity Response Tolerated well  Mobility Referral Yes  Mobility visit 1 Mobility   Pt resting in chair upon entry on RA. Pt STS ambulates to hallway +2 with RW. Pt took x3 seated rest breaks before returning to recliner in room. Pt left in recliner with needs in reach and chair alarm activated.   Guido Rumble Mobility Specialist 08/06/24, 5:24 PM

## 2024-08-07 DIAGNOSIS — D709 Neutropenia, unspecified: Secondary | ICD-10-CM

## 2024-08-07 DIAGNOSIS — L89322 Pressure ulcer of left buttock, stage 2: Secondary | ICD-10-CM

## 2024-08-07 DIAGNOSIS — E11628 Type 2 diabetes mellitus with other skin complications: Secondary | ICD-10-CM | POA: Diagnosis not present

## 2024-08-07 DIAGNOSIS — I9589 Other hypotension: Secondary | ICD-10-CM | POA: Diagnosis not present

## 2024-08-07 DIAGNOSIS — L89620 Pressure ulcer of left heel, unstageable: Secondary | ICD-10-CM | POA: Diagnosis not present

## 2024-08-07 LAB — CREATININE, SERUM
Creatinine, Ser: 0.83 mg/dL (ref 0.61–1.24)
GFR, Estimated: >60 mL/min (ref >60)

## 2024-08-07 LAB — CBC
HCT: 25.7 % — ABNORMAL LOW (ref 39.0–52.0)
Hemoglobin: 8.4 g/dL — ABNORMAL LOW (ref 13.0–17.0)
MCH: 28.3 pg (ref 26.0–34.0)
MCHC: 32.7 g/dL (ref 30.0–36.0)
MCV: 86.5 fL (ref 80.0–100.0)
Platelets: 408 K/uL — ABNORMAL HIGH (ref 150–400)
RBC: 2.97 MIL/uL — ABNORMAL LOW (ref 4.22–5.81)
RDW: 17.2 % — ABNORMAL HIGH (ref 11.5–15.5)
WBC: 1.1 K/uL — CL (ref 4.0–10.5)
nRBC: 0 % (ref 0.0–0.2)

## 2024-08-07 MED ORDER — SODIUM CHLORIDE 0.9 % IV SOLN
2.0000 g | Freq: Three times a day (TID) | INTRAVENOUS | Status: AC
  Administered 2024-08-07 – 2024-08-09 (×8): 2 g via INTRAVENOUS
  Filled 2024-08-07 (×9): qty 12.5

## 2024-08-07 MED ORDER — LOPERAMIDE HCL 2 MG PO CAPS
4.0000 mg | ORAL_CAPSULE | Freq: Three times a day (TID) | ORAL | Status: DC | PRN
  Administered 2024-08-07: 4 mg via ORAL
  Filled 2024-08-07: qty 2

## 2024-08-07 NOTE — Progress Notes (Signed)
 Mobility Specialist - Progress Note     08/07/24 1056  Mobility  Activity Ambulated with assistance  Level of Assistance Moderate assist, patient does 50-74%  Assistive Device Front wheel walker  Distance Ambulated (ft) 40 ft  Range of Motion/Exercises Active  RLE Weight Bearing Per Provider Order WBAT  Activity Response Tolerated well  Mobility Referral Yes  Mobility visit 1 Mobility   Pt resting EOB upon entry on RA. Pt STS x4 and ambulates in hallway ModA with RW x4 seated rest breaks in recliner. Pt wheeled back to room and left with needs in reach. Chair alarm activated.   Guido Rumble Mobility Specialist 08/07/24, 11:35 AM

## 2024-08-07 NOTE — Progress Notes (Signed)
 Progress Note   Patient: [name] FMW:[id number] DOB: [birth year] DOA: 07/24/2024     13 DOS: the patient was seen and examined on 08/07/2024   Brief hospital course: 73 y.o. male with medical history significant of metastatic prostate cancer, type 2 diabetes mellitus, arthritis, hypertension and hyperlipidemia.  He presents to the hospital with low sugar.  He states his sister found him on the floor.  His sugar was 36.  Feels a little weak.  He states that he has been taking around 20 units of Lantus  and supposed to take 10.  He has also been taking 12 units of Humalog and was supposed to take 4.  He states he cannot see very well.  He draws up his own insulin  and injects his own insulin .  When he has a low sugar he feels out of it.  In the ER his sugar was in the 50s and 60s.  Hospitalist services contacted for further evaluation.  Patient also states he has an ulcer of his right foot that has been draining a little bit.  No fever chills or sweats.  He finished up radiation treatment for metastatic prostate cancer.  Follows with Dr. Ashley for his foot.  Patient put on D5W and IV antibiotics.  11/24.  Patient taken off D5W drip and will watch sugars another day.  Concerned about his vision and drawing up insulin .  Wondering if I can do pills instead.  MRI right foot did not show osteomyelitis.  Podiatry saw patient and recommended outpatient follow-up. 11/25.  Physical therapy recommending rehab from yesterday.  Case discussed with diabetes coordinator and will do Tradjenta  and Glucophage  and get rid of insulin .  Podiatry was okay with how the wounds look.  Will need close follow-up as outpatient. 11/25 continued.  Patient spiked a fever of 100.7.  Treat as neutropenic fever.  Blood cultures, urine culture and chest x-ray ordered.  Maxipime  ordered.  Continue doxycycline .  11/26: Maximum temperature recorded of 102.7 over the past 24-hour, worsening leukopenia at 0.9-ordered another dose of  Granix .  Preliminary blood cultures negative, checking respiratory viral panel, negative COVID, influenza and RSV.  Continuing cefepime  for now.  11/27: Afebrile over the past 24-hour, WBC at 1.0 and neutrophils of 0.  Discussed with Dr. Jacobo from oncology, who advised to continue Granix  until neutrophil of 1000.  Another dose of 480 mg ordered today.  Mild hypophosphatemia which is being repleted.  11/28: Remained afebrile.  WBC still at 1 with neutrophil of 0, continuing Granix , phosphorus improved to 2.4-giving another dose. Foot wound does not look infected.  11/29: Remained afebrile, WBC still at 0.9 with neutrophils of 0, case was discussed again with Dr. Jacobo who advised to discontinue antibiotics and continue with Granix , continue holding Erleada .  11/30: Remained afebrile and hemodynamically stable, slowly worsening leukopenia now WBC at 0.8, neutrophil remains 0.  Case was discussed again with Dr. Billi to continue with Granix  and he will discuss with his oncologist Dr. Rennie tomorrow as he might need bone marrow biopsy.  12/1: Remained afebrile but WBC still at 0.8 with 0 neutrophil.  Bone marrow biopsy scheduled for tomorrow.  12/2: Hemodynamically stable, no change in cell count.  S/p bone marrow biopsy today.  Insurance authorization obtained for SNF, message sent to Dr. Rennie when he thinks he will be safe to move as neutrophil remains 0.  12/5.  Blood pressure on the lower side today will start midodrine .  White blood cell count still low at  0.9 and neutrophil count remains at 0. 12/6.  White blood cell count still low at 0.8 and neutrophil count remains at 0.  Assessment and Plan: * Neutropenic fever Start empiric Maxipime .  Get blood cultures and urine culture.  With white count still low at 1.1 need to treat aggressively.  Continue filgrastim .  Hypoglycemia On admission.  Likely secondary to taking too much insulin  and poor vision on drawing  up his insulin .  Initially placed on D5 drip.  Taken off D5 drip on 11/24.  Continue Tradjenta  and Glucophage  to maintain sugars.  Patient will not need insulin  upon discharge.  Diabetic foot infection [facility]) Appreciate podiatry consultation.  MRI does not show any osteomyelitis.  Received antibiotics during the hospital course  Will need close follow-up as outpatient. Continue with wound care  Decubitus ulcer of left heel, unstageable (HCC) heel protector.  Hypotension Continue low-dose midodrine .  Hold blood pressure medication.  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  Patient on apalutamide  (holding here).  Patient's PSA down to 63.51  Hyperlipidemia, unspecified Continue Lipitor  Anemia of chronic disease Hemoglobin 8.4  Obesity, Class III, BMI 40-49.9 (morbid obesity) (HCC) BMI 40.31 with only height and weight in computer.  Low serum vitamin B12 IM B12 followed by oral B12  Pressure injury Wound 07/25/24 Pressure Injury Heel Left Unstageable - Full thickness tissue loss in which the base of the injury is covered by slough (yellow, tan, gray, green or brown) and/or eschar (tan, brown or black) in the wound bed. (Active)   Bilateral buttocks stage II with red moist macerated skin and patchy areas of partial-thickness loss consistent with moisture associated skin damage.  Both present on admission.            Subjective: Patient spiked a fever of 100.9.  With his neutropenia will start antibiotics.  Patient feels cold.  No cough.  No nausea or vomiting.  Slight diarrhea.  White count still 1.1.  No pain in his feet.  Physical Exam: Vitals:   08/06/24 1607 08/06/24 1934 08/07/24 0549 08/07/24 0733  BP: (!) 101/47 (!) 109/52 (!) 110/46 (!) 95/49  Pulse: 99 90 (!) 101 (!) 109  Resp: 18 20  18   Temp: 98.9 F (37.2 C) 99.3 F (37.4 C) 99.8 F (37.7 C) (!) 100.9 F (38.3 C)  TempSrc: Oral   Oral   SpO2: 95% 93% 95% 100%  Weight:      Height:       Physical Exam HENT:     Head: Normocephalic.  Eyes:     General: Lids are normal.  Cardiovascular:     Rate and Rhythm: Normal rate and regular rhythm.     Heart sounds: Normal heart sounds, S1 normal and S2 normal.  Pulmonary:     Breath sounds: No decreased breath sounds, wheezing, rhonchi or rales.  Abdominal:     Palpations: Abdomen is soft.     Tenderness: There is no abdominal tenderness.  Musculoskeletal:     Right lower leg: Swelling present.     Left lower leg: Swelling present.  Skin:    General: Skin is warm.     Comments: Chronic lower extremity skin discoloration.  Neurological:     Mental Status: He is alert.     Data Reviewed: Creatinine 0.83 with a GFR greater than 60, white blood cell count 1.1, hemoglobin 8.4, platelet count 408 Family Communication: Updated sister on the phone  Disposition: Status  is: Inpatient Remains inpatient appropriate because: With fever of 100.9 I will get blood cultures urine culture and start antibiotics again for neutropenic fever  Planned Discharge Destination: Skilled nursing facility    Time spent: 28 minutes Case discussed with nursing staff  Author: Charlie Patterson, MD 08/07/2024 12:20 PM  For on call review [email or web address].

## 2024-08-07 NOTE — Assessment & Plan Note (Addendum)
 Start empiric Maxipime .  Get blood cultures and urine culture.  With white count still low at 1.1 need to treat aggressively.  Continue filgrastim .

## 2024-08-08 ENCOUNTER — Other Ambulatory Visit: Payer: Self-pay

## 2024-08-08 DIAGNOSIS — C61 Malignant neoplasm of prostate: Secondary | ICD-10-CM | POA: Diagnosis not present

## 2024-08-08 DIAGNOSIS — E538 Deficiency of other specified B group vitamins: Secondary | ICD-10-CM | POA: Diagnosis not present

## 2024-08-08 DIAGNOSIS — E785 Hyperlipidemia, unspecified: Secondary | ICD-10-CM | POA: Diagnosis not present

## 2024-08-08 DIAGNOSIS — E162 Hypoglycemia, unspecified: Secondary | ICD-10-CM | POA: Diagnosis not present

## 2024-08-08 DIAGNOSIS — D638 Anemia in other chronic diseases classified elsewhere: Secondary | ICD-10-CM | POA: Diagnosis not present

## 2024-08-08 DIAGNOSIS — D709 Neutropenia, unspecified: Secondary | ICD-10-CM | POA: Diagnosis not present

## 2024-08-08 DIAGNOSIS — E66813 Obesity, class 3: Secondary | ICD-10-CM | POA: Diagnosis not present

## 2024-08-08 DIAGNOSIS — E11628 Type 2 diabetes mellitus with other skin complications: Secondary | ICD-10-CM | POA: Diagnosis not present

## 2024-08-08 DIAGNOSIS — L89620 Pressure ulcer of left heel, unstageable: Secondary | ICD-10-CM | POA: Diagnosis not present

## 2024-08-08 DIAGNOSIS — L89302 Pressure ulcer of unspecified buttock, stage 2: Secondary | ICD-10-CM | POA: Diagnosis not present

## 2024-08-08 DIAGNOSIS — I9589 Other hypotension: Secondary | ICD-10-CM | POA: Diagnosis not present

## 2024-08-08 LAB — CBC WITH DIFFERENTIAL/PLATELET
Abs Immature Granulocytes: 0 K/uL (ref 0.00–0.07)
Basophils Absolute: 0 K/uL (ref 0.0–0.1)
Basophils Relative: 1 %
Eosinophils Absolute: 0 K/uL (ref 0.0–0.5)
Eosinophils Relative: 0 %
HCT: 24.5 % — ABNORMAL LOW (ref 39.0–52.0)
Hemoglobin: 8.2 g/dL — ABNORMAL LOW (ref 13.0–17.0)
Immature Granulocytes: 0 %
Lymphocytes Relative: 37 %
Lymphs Abs: 0.4 K/uL — ABNORMAL LOW (ref 0.7–4.0)
MCH: 28.5 pg (ref 26.0–34.0)
MCHC: 33.5 g/dL (ref 30.0–36.0)
MCV: 85.1 fL (ref 80.0–100.0)
Monocytes Absolute: 0.6 K/uL (ref 0.1–1.0)
Monocytes Relative: 60 %
Neutro Abs: 0 K/uL — CL (ref 1.7–7.7)
Neutrophils Relative %: 2 %
Platelets: 376 K/uL (ref 150–400)
RBC: 2.88 MIL/uL — ABNORMAL LOW (ref 4.22–5.81)
RDW: 16.8 % — ABNORMAL HIGH (ref 11.5–15.5)
Smear Review: NORMAL
WBC: 1 K/uL — CL (ref 4.0–10.5)
nRBC: 0 % (ref 0.0–0.2)

## 2024-08-08 LAB — GLUCOSE, CAPILLARY
Glucose-Capillary: 138 mg/dL — ABNORMAL HIGH (ref 70–99)
Glucose-Capillary: 126 mg/dL — ABNORMAL HIGH (ref 70–99)
Glucose-Capillary: 149 mg/dL — ABNORMAL HIGH (ref 70–99)
Glucose-Capillary: 203 mg/dL — ABNORMAL HIGH (ref 70–99)

## 2024-08-08 LAB — URINALYSIS, W/ REFLEX TO CULTURE (INFECTION SUSPECTED)
Bacteria, UA: NONE SEEN
Bilirubin Urine: NEGATIVE
Glucose, UA: NEGATIVE mg/dL
Hgb urine dipstick: NEGATIVE
Ketones, ur: 5 mg/dL — AB
Leukocytes,Ua: NEGATIVE
Nitrite: NEGATIVE
Protein, ur: NEGATIVE mg/dL
Specific Gravity, Urine: 1.018 (ref 1.005–1.030)
Squamous Epithelial / HPF: 0 /HPF (ref 0–5)
pH: 5 (ref 5.0–8.0)

## 2024-08-08 LAB — SURGICAL PATHOLOGY

## 2024-08-08 MED ORDER — PREDNISONE 50 MG PO TABS
60.0000 mg | ORAL_TABLET | Freq: Every day | ORAL | Status: DC
  Administered 2024-08-08 – 2024-08-12 (×5): 60 mg via ORAL
  Filled 2024-08-08 (×5): qty 1

## 2024-08-08 MED ORDER — IMMUNE GLOBULIN (HUMAN) 10 GM/100ML IV SOLN
1.0000 g/kg | INTRAVENOUS | Status: AC
  Administered 2024-08-08 – 2024-08-09 (×2): 100 g via INTRAVENOUS
  Filled 2024-08-08 (×2): qty 1000

## 2024-08-08 MED ORDER — VANCOMYCIN HCL 500 MG/100ML IV SOLN
500.0000 mg | Freq: Once | INTRAVENOUS | Status: AC
  Administered 2024-08-08: 500 mg via INTRAVENOUS
  Filled 2024-08-08: qty 100

## 2024-08-08 MED ORDER — VANCOMYCIN HCL 1250 MG/250ML IV SOLN
1250.0000 mg | Freq: Two times a day (BID) | INTRAVENOUS | Status: AC
  Administered 2024-08-09 (×2): 1250 mg via INTRAVENOUS
  Filled 2024-08-08 (×2): qty 250

## 2024-08-08 MED ORDER — VANCOMYCIN HCL 2000 MG/400ML IV SOLN
2000.0000 mg | Freq: Once | INTRAVENOUS | Status: AC
  Administered 2024-08-08: 2000 mg via INTRAVENOUS
  Filled 2024-08-08 (×3): qty 400

## 2024-08-08 NOTE — Progress Notes (Signed)
 Progress Note   Patient: [name] FMW:[id number] DOB: 1951-01-23 DOA: 07/24/2024     14 DOS: the patient was seen and examined on 08/08/2024   Brief hospital course: 73 y.o. male with medical history significant of metastatic prostate cancer, type 2 diabetes mellitus, arthritis, hypertension and hyperlipidemia.  He presents to the hospital with low sugar.  He states his sister found him on the floor.  His sugar was 36.  Feels a little weak.  He states that he has been taking around 20 units of Lantus  and supposed to take 10.  He has also been taking 12 units of Humalog and was supposed to take 4.  He states he cannot see very well.  He draws up his own insulin  and injects his own insulin .  When he has a low sugar he feels out of it.  In the ER his sugar was in the 50s and 60s.  Hospitalist services contacted for further evaluation.  Patient also states he has an ulcer of his right foot that has been draining a little bit.  No fever chills or sweats.  He finished up radiation treatment for metastatic prostate cancer.  Follows with Dr. Ashley for his foot.  Patient put on D5W and IV antibiotics.  11/24.  Patient taken off D5W drip and will watch sugars another day.  Concerned about his vision and drawing up insulin .  Wondering if I can do pills instead.  MRI right foot did not show osteomyelitis.  Podiatry saw patient and recommended outpatient follow-up. 11/25.  Physical therapy recommending rehab from yesterday.  Case discussed with diabetes coordinator and will do Tradjenta  and Glucophage  and get rid of insulin .  Podiatry was okay with how the wounds look.  Will need close follow-up as outpatient. 11/25 continued.  Patient spiked a fever of 100.7.  Treat as neutropenic fever.  Blood cultures, urine culture and chest x-ray ordered.  Maxipime  ordered.  Continue doxycycline .  11/26: Maximum temperature recorded of 102.7 over the past 24-hour, worsening leukopenia at 0.9-ordered another dose of  Granix .  Preliminary blood cultures negative, checking respiratory viral panel, negative COVID, influenza and RSV.  Continuing cefepime  for now.  11/27: Afebrile over the past 24-hour, WBC at 1.0 and neutrophils of 0.  Discussed with Dr. Jacobo from oncology, who advised to continue Granix  until neutrophil of 1000.  Another dose of 480 mg ordered today.  Mild hypophosphatemia which is being repleted.  11/28: Remained afebrile.  WBC still at 1 with neutrophil of 0, continuing Granix , phosphorus improved to 2.4-giving another dose. Foot wound does not look infected.  11/29: Remained afebrile, WBC still at 0.9 with neutrophils of 0, case was discussed again with Dr. Jacobo who advised to discontinue antibiotics and continue with Granix , continue holding Erleada .  11/30: Remained afebrile and hemodynamically stable, slowly worsening leukopenia now WBC at 0.8, neutrophil remains 0.  Case was discussed again with Dr. Billi to continue with Granix  and he will discuss with his oncologist Dr. Rennie tomorrow as he might need bone marrow biopsy.  12/1: Remained afebrile but WBC still at 0.8 with 0 neutrophil.  Bone marrow biopsy scheduled for tomorrow.  12/2: Hemodynamically stable, no change in cell count.  S/p bone marrow biopsy today.  Insurance authorization obtained for SNF, message sent to Dr. Rennie when he thinks he will be safe to move as neutrophil remains 0.  12/5.  Blood pressure on the lower side today will start midodrine .  White blood cell count still low at  0.9 and neutrophil count remains at 0. 12/6.  White blood cell count still low at 0.8 and neutrophil count remains at 0.   12/7.  Patient spiked a fever in the afternoon and started on Maxipime .  Blood cultures, urine and stool ordered. 12/8.  Patient still with low white count of 1 and neutrophil count remains at 0.  Continue empiric antibiotic  Assessment and Plan: * Neutropenic fever On empiric Maxipime .   White blood cell count 1.0 with neutrophil count 0.  Will speak with pharmacy staff about what to do with antibiotics.  So far blood cultures negative, urine negative.  Ordered stool comprehensive panel.  Hypoglycemia On admission.  Likely secondary to taking too much insulin  and poor vision on drawing up his insulin .  Initially placed on D5 drip.  Taken off D5 drip on 11/24.  Continue Tradjenta  and Glucophage  to maintain sugars.  Patient will not need insulin  upon discharge.  Diabetic foot infection [facility]) Appreciate podiatry consultation.  MRI does not show any osteomyelitis.  Received antibiotics during the hospital course.   Will need close follow-up as outpatient. Continue with wound care  Decubitus ulcer of left heel, unstageable (HCC) heel protector.  Hypotension Continue low-dose midodrine .  Hold blood pressure medication.  Prostate cancer metastatic to multiple sites [facility]) Patient states he has finished up radiation therapy.  Patient on apalutamide  (holding here).  Patient's PSA down to 63.51  Hyperlipidemia, unspecified Continue Lipitor  Anemia of chronic disease Hemoglobin 8.2  Obesity, Class III, BMI 40-49.9 (morbid obesity) (HCC) BMI 40.31 with only height and weight in computer.  Low serum vitamin B12 IM B12 followed by oral B12  Pressure injury Wound 07/25/24 Pressure Injury Heel Left Unstageable - Full thickness tissue loss in which the base of the injury is covered by slough (yellow, tan, gray, green or brown) and/or eschar (tan, brown or black) in the wound bed. (Active)   Bilateral buttocks stage II with red moist macerated skin and patchy areas of partial-thickness loss consistent with moisture associated skin damage.  Both present on admission.            Subjective: Patient feels okay.  Sitting in chair.  Walked better yesterday with mobility specialist 40 feet.  Spiked a fever yesterday and restarted on antibiotic.  Had diarrhea yesterday stool  studies ordered.  Physical Exam: Vitals:   08/07/24 1612 08/07/24 1935 08/08/24 0332 08/08/24 0805  BP: 106/64 (!) 110/59 (!) 100/51 (!) 104/50  Pulse: (!) 110 (!) 101 100 (!) 105  Resp: 17   18  Temp: 98.1 F (36.7 C) 97.8 F (36.6 C) 99.7 F (37.6 C) 100 F (37.8 C)  TempSrc: Oral Oral Oral Oral  SpO2: 96% 100% 96% 95%  Weight:      Height:       Physical Exam HENT:     Head: Normocephalic.  Eyes:     General: Lids are normal.  Cardiovascular:     Rate and Rhythm: Normal rate and regular rhythm.     Heart sounds: Normal heart sounds, S1 normal and S2 normal.  Pulmonary:     Breath sounds: No decreased breath sounds, wheezing, rhonchi or rales.  Abdominal:     Palpations: Abdomen is soft.     Tenderness: There is no abdominal tenderness.  Musculoskeletal:     Right lower leg: Swelling present.     Left lower leg: Swelling present.  Skin:    General: Skin is warm.     Comments: Chronic lower  extremity skin discoloration.  Neurological:     Mental Status: He is alert.     Data Reviewed: White count 1.0, hemoglobin 8.2, platelet count 376, neutrophil count 0  Family Communication: Spoke with sister on the phone  Disposition: Status is: Inpatient Remains inpatient appropriate because: Patient with neutropenic fever.  Continue antibiotics and continue to monitor  Planned Discharge Destination: Skilled nursing facility    Time spent: 28 minutes  Author: Charlie Patterson, MD 08/08/2024 11:27 AM  For on call review [email or web address].

## 2024-08-08 NOTE — TOC Progression Note (Signed)
 Transition of Care [facility]) - Progression Note    Patient Details  Name: [name] MRN: [id number] Date of Birth: 29-Oct-1950  Transition of Care [facility]) CM/SW Contact  Dalia GORMAN Fuse, RN Phone Number: 08/08/2024, 9:03 AM  Clinical Narrative:     WBC 2 and neutrophils 1.1. Treating aggressively. On maxipime  empirically and filgastrim. Has a bed at [facility], will start auth when medically appropriate.                     Expected Discharge Plan and Services                                               Social Drivers of Health (SDOH) Interventions SDOH Screenings   Food Insecurity: No Food Insecurity (07/29/2024)  Housing: High Risk (07/29/2024)  Transportation Needs: No Transportation Needs (07/29/2024)  Utilities: Not At Risk (07/29/2024)  Depression (PHQ2-9): Low Risk  (07/07/2024)  Financial Resource Strain: Low Risk  (08/28/2023)   Received from [facility]  Recent Concern: Financial Resource Strain - Medium Risk (08/06/2023)   Received from [facility]  Physical Activity: Inactive (08/28/2023)   Received from [facility]  Social Connections: Moderately Integrated (07/29/2024)  Stress: No Stress Concern Present (08/28/2023)   Received from [facility]  Tobacco Use: Medium Risk (07/24/2024)  Health Literacy: Adequate Health Literacy (08/28/2023)   Received from [facility]    Readmission Risk Interventions     No data to display

## 2024-08-08 NOTE — Plan of Care (Signed)

## 2024-08-08 NOTE — Progress Notes (Signed)
 [name]   DOB:12-29-1950   FM#:[id number]    Subjective: Patient noted to fever yesterday.. Also feels chills  Appetite fair.  No nausea or vomiting.  Objective:  Vitals:   08/08/24 0332 08/08/24 0805  BP: (!) 100/51 (!) 104/50  Pulse: 100 (!) 105  Resp:  18  Temp: 99.7 F (37.6 C) 100 F (37.8 C)  SpO2: 96% 95%     Intake/Output Summary (Last 24 hours) at 08/08/2024 1602 Last data filed at 08/08/2024 1406 Gross per 24 hour  Intake 340 ml  Output 700 ml  Net -360 ml    Physical Exam Vitals and nursing note reviewed.  HENT:     Head: Normocephalic and atraumatic.     Mouth/Throat:     Pharynx: Oropharynx is clear.  Eyes:     Extraocular Movements: Extraocular movements intact.     Pupils: Pupils are equal, round, and reactive to light.  Cardiovascular:     Rate and Rhythm: Normal rate and regular rhythm.  Pulmonary:     Comments: Decreased breath sounds bilaterally.  Abdominal:     Palpations: Abdomen is soft.  Musculoskeletal:        General: Normal range of motion.     Cervical back: Normal range of motion.  Skin:    General: Skin is warm.  Neurological:     General: No focal deficit present.     Mental Status: He is alert and oriented to person, place, and time.  Psychiatric:        Behavior: Behavior normal.        Judgment: Judgment normal.      Labs:  Lab Results  Component Value Date   WBC 1.0 (LL) 08/08/2024   HGB 8.2 (L) 08/08/2024   HCT 24.5 (L) 08/08/2024   MCV 85.1 08/08/2024   PLT 376 08/08/2024   NEUTROABS 0.0 (LL) 08/08/2024    Lab Results  Component Value Date   NA 135 08/01/2024   K 4.5 08/01/2024   CL 103 08/01/2024   CO2 22 08/01/2024    Studies:  No results found.   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  Incidentally noted to have worsening leukopenia neutropenia and anemia.   #  Worsening leukopenia/neutropenia-anemia etiology is unclear.  August 02, 2024 bone marrow biopsy-no obvious evidence of MDS or leukemia to explain the significant leukopenia/neutropenia.  Suspect acute bone marrow insult-likely viral infection.  Bone marrow also notes to have involvement by prostate cancer-however clinically not suggestive of cause of patient's neutropenia. patient has not significantly improved even on Granix .  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   # Elevated PTT-recommend mixing studies.   # Metastatic prostate cancer-castrate sensitive with bone metastases-status post Eligard; Erleda on hold given patient's-significant neutropenia.  Improving.  # Multiple other medical problems including diabetes-labile blood sugars; history of diabetic foot infections/cubitus ulcers etc.   # However plan of care was discussed with hospitalist service, Dr.Weiting.  I also spoke to patient's sister Inocente.  Rasaan Brotherton R Carnell Casamento, MD

## 2024-08-08 NOTE — Progress Notes (Signed)
 Pharmacy Antibiotic Note  [name] is a 73 y.o. male admitted on 07/24/2024 with neutropenic fever.  Pharmacy has been consulted for Vancomyin dosing.  - pt was on oral chemo for stage 4 prostate Cancer w/ mets -DFI  Plan: Will order Vancomycin  2500 mg IV for loading dose. Will continue with Vancomycin  1250 mg IV Q 12 hrs. Goal AUC 400-550. Expected AUC: 519 Cmin 15.1 SCr used: 0.83 BMI 40  Vd 0.5  Will continue to assess renal fxn, cultures, length of therapy, etc    Height: 5' 11 (180.3 cm) Weight: 131.1 kg (289 lb 0.4 oz) IBW/kg (Calculated) : 75.3  Temp (24hrs), Avg:99.2 F (37.3 C), Min:97.8 F (36.6 C), Max:100.3 F (37.9 C)  Recent Labs  Lab 08/04/24 0519 08/05/24 0616 08/06/24 0602 08/07/24 0504 08/08/24 0539  WBC 1.1* 0.9* 0.8* 1.1* 1.0*  CREATININE  --   --   --  0.83  --     Estimated Creatinine Clearance: 107.8 mL/min (by C-G formula based on SCr of 0.83 mg/dL).    Allergies  Allergen Reactions   Sulfa Antibiotics Rash   Piperacillin -Tazobactam In Dex     Antimicrobials this admission: ceftriaxone  11/23 >> 11/24 vancomycin  11/23 >>11/24,  restart 12/8>> Doxycycline  11/25>>11/26 Cefepime  11/25>>11/29,   restart 12/7 >>  Dose adjustments this admission:    Microbiology results: 11/25 BCx: NGTD 12/7 Bcx: NG<24 hrs   UCx:      Sputum:      MRSA PCR:   11/23 R foot wound cx: -MODERATE CORYNEBACTERIUM STRIATUM  -FEW ENTEROCOCCUS FAECALIS   (R: Gent) -RARE MORAXELLA SPECIES  -BETA LACTAMASE NEGATIVE  -NO ANAEROBES ISOLATED   Thank you for allowing pharmacy to be a part of this patient's care.  Allean Haas PharmD Clinical Pharmacist 08/08/2024

## 2024-08-09 ENCOUNTER — Encounter (HOSPITAL_COMMUNITY): Payer: Self-pay

## 2024-08-09 ENCOUNTER — Other Ambulatory Visit: Payer: Self-pay

## 2024-08-09 DIAGNOSIS — E162 Hypoglycemia, unspecified: Secondary | ICD-10-CM | POA: Diagnosis not present

## 2024-08-09 DIAGNOSIS — C61 Malignant neoplasm of prostate: Secondary | ICD-10-CM | POA: Diagnosis not present

## 2024-08-09 DIAGNOSIS — E11628 Type 2 diabetes mellitus with other skin complications: Secondary | ICD-10-CM | POA: Diagnosis not present

## 2024-08-09 DIAGNOSIS — L89620 Pressure ulcer of left heel, unstageable: Secondary | ICD-10-CM | POA: Diagnosis not present

## 2024-08-09 DIAGNOSIS — D709 Neutropenia, unspecified: Secondary | ICD-10-CM | POA: Diagnosis not present

## 2024-08-09 DIAGNOSIS — E538 Deficiency of other specified B group vitamins: Secondary | ICD-10-CM | POA: Diagnosis not present

## 2024-08-09 DIAGNOSIS — R5081 Fever presenting with conditions classified elsewhere: Secondary | ICD-10-CM | POA: Diagnosis not present

## 2024-08-09 DIAGNOSIS — I959 Hypotension, unspecified: Secondary | ICD-10-CM | POA: Diagnosis not present

## 2024-08-09 DIAGNOSIS — E66813 Obesity, class 3: Secondary | ICD-10-CM | POA: Diagnosis not present

## 2024-08-09 DIAGNOSIS — E785 Hyperlipidemia, unspecified: Secondary | ICD-10-CM | POA: Diagnosis not present

## 2024-08-09 DIAGNOSIS — L89302 Pressure ulcer of unspecified buttock, stage 2: Secondary | ICD-10-CM | POA: Diagnosis not present

## 2024-08-09 DIAGNOSIS — D638 Anemia in other chronic diseases classified elsewhere: Secondary | ICD-10-CM | POA: Diagnosis not present

## 2024-08-09 LAB — CBC WITH DIFFERENTIAL/PLATELET
Abs Immature Granulocytes: 0 K/uL (ref 0.00–0.07)
Basophils Absolute: 0 K/uL (ref 0.0–0.1)
Basophils Relative: 2 %
Eosinophils Absolute: 0 K/uL (ref 0.0–0.5)
Eosinophils Relative: 0 %
HCT: 22.8 % — ABNORMAL LOW (ref 39.0–52.0)
Hemoglobin: 7.7 g/dL — ABNORMAL LOW (ref 13.0–17.0)
Immature Granulocytes: 0 %
Lymphocytes Relative: 25 %
Lymphs Abs: 0.1 K/uL — ABNORMAL LOW (ref 0.7–4.0)
MCH: 29.6 pg (ref 26.0–34.0)
MCHC: 33.8 g/dL (ref 30.0–36.0)
MCV: 87.7 fL (ref 80.0–100.0)
Monocytes Absolute: 0.3 K/uL (ref 0.1–1.0)
Monocytes Relative: 73 %
Neutro Abs: 0 K/uL — CL (ref 1.7–7.7)
Neutrophils Relative %: 0 %
Platelets: 342 K/uL (ref 150–400)
RBC: 2.6 MIL/uL — ABNORMAL LOW (ref 4.22–5.81)
RDW: 17.2 % — ABNORMAL HIGH (ref 11.5–15.5)
Smear Review: NORMAL
WBC: 0.5 K/uL — CL (ref 4.0–10.5)
nRBC: 0 % (ref 0.0–0.2)

## 2024-08-09 LAB — PARVOVIRUS B19 ANTIBODY, IGG AND IGM
Parovirus B19 IgG Abs: 0.2 {index} (ref 0.0–0.8)
Parovirus B19 IgM Abs: 0 {index} (ref 0.0–0.8)

## 2024-08-09 LAB — GLUCOSE, CAPILLARY
Glucose-Capillary: 220 mg/dL — ABNORMAL HIGH (ref 70–99)
Glucose-Capillary: 184 mg/dL — ABNORMAL HIGH (ref 70–99)
Glucose-Capillary: 222 mg/dL — ABNORMAL HIGH (ref 70–99)
Glucose-Capillary: 189 mg/dL — ABNORMAL HIGH (ref 70–99)
Glucose-Capillary: 199 mg/dL — ABNORMAL HIGH (ref 70–99)

## 2024-08-09 LAB — PTT FACTOR INHIBITOR (MIXING STUDY): aPTT: 29.8 s (ref 22.9–30.2)

## 2024-08-09 LAB — CREATININE, SERUM
Creatinine, Ser: 0.76 mg/dL (ref 0.61–1.24)
GFR, Estimated: >60 mL/min (ref >60)

## 2024-08-09 MED ORDER — INSULIN ASPART 100 UNIT/ML IJ SOLN
0.0000 [IU] | Freq: Three times a day (TID) | INTRAMUSCULAR | Status: DC
  Administered 2024-08-09: 1 [IU] via SUBCUTANEOUS
  Administered 2024-08-09: 2 [IU] via SUBCUTANEOUS
  Administered 2024-08-10: 3 [IU] via SUBCUTANEOUS
  Administered 2024-08-10 (×2): 1 [IU] via SUBCUTANEOUS
  Administered 2024-08-11 (×2): 3 [IU] via SUBCUTANEOUS
  Administered 2024-08-11: 1 [IU] via SUBCUTANEOUS
  Administered 2024-08-12: 2 [IU] via SUBCUTANEOUS
  Administered 2024-08-12: 1 [IU] via SUBCUTANEOUS
  Filled 2024-08-09 (×2): qty 3
  Filled 2024-08-09: qty 1
  Filled 2024-08-09: qty 2
  Filled 2024-08-09: qty 3
  Filled 2024-08-09 (×3): qty 1
  Filled 2024-08-09: qty 2
  Filled 2024-08-09: qty 1

## 2024-08-09 MED ORDER — INSULIN ASPART 100 UNIT/ML IJ SOLN
0.0000 [IU] | Freq: Every day | INTRAMUSCULAR | Status: DC
  Administered 2024-08-10: 3 [IU] via SUBCUTANEOUS
  Filled 2024-08-09: qty 3

## 2024-08-09 MED ORDER — AMOXICILLIN 500 MG PO CAPS
500.0000 mg | ORAL_CAPSULE | Freq: Three times a day (TID) | ORAL | Status: DC
  Administered 2024-08-10 – 2024-08-12 (×8): 500 mg via ORAL
  Filled 2024-08-09 (×9): qty 1

## 2024-08-09 NOTE — Progress Notes (Signed)
 Physical Therapy Treatment Patient Details Name: [name] MRN: [id number] DOB: Jan 04, 1951 Today's Date: 08/09/2024   History of Present Illness Pt is a 73 year old male admitted with hypoglycemia, diabetic foot infection, decubitus ulcer of L heel, hypotension     PMH significant for metastatic prostate cancer, type 2 diabetes mellitus, arthritis, hypertension and hyperlipidemia.    PT Comments  Pt received in bed for co-tx with OT to safely progress functional mobility. Pt appeared more fatigued and weak this date, requiring increased assist to transfer. Increased fatigue with gait training as well requiring more seated rest breaks and less distance tolerated. Pt required chair follow due to unpredictable tolerance and abrupt need to sit. HR 120-130's upon exertion, SpO2 95-99% on RA. Pt left sitting up in chair with all needs in reach.   If plan is discharge home, recommend the following: A lot of help with walking and/or transfers;A lot of help with bathing/dressing/bathroom;Assistance with cooking/housework;Direct supervision/assist for medications management;Direct supervision/assist for financial management;Assist for transportation;Help with stairs or ramp for entrance;Supervision due to cognitive status   Can travel by private vehicle     No  Equipment Recommendations  Other (comment) (TBD at next level of care)    Recommendations for Other Services       Precautions / Restrictions Precautions Precautions: Fall Recall of Precautions/Restrictions: Intact Required Braces or Orthoses: Other Brace Other Brace:  (Prevalon boot Left foot when in bed) Restrictions Weight Bearing Restrictions Per Provider Order: Yes RLE Weight Bearing Per Provider Order: Weight bearing as tolerated LLE Weight Bearing Per Provider Order: Weight bearing as tolerated     Mobility  Bed Mobility Overal bed mobility: Needs Assistance Bed Mobility: Supine to Sit     Supine to sit: Mod  assist, +2 for physical assistance, HOB elevated, Used rails     General bed mobility comments:  (Increased time to scoot to EOB)    Transfers Overall transfer level: Needs assistance Equipment used: Rolling walker (2 wheels) (Bariatric) Transfers: Sit to/from Stand Sit to Stand: Mod assist, +2 physical assistance           General transfer comment:  (Pt required several attempts to power up to standing)    Ambulation/Gait Ambulation/Gait assistance: Min assist, Contact guard assist Gait Distance (Feet):  (10) Assistive device: Rolling walker (2 wheels) Gait Pattern/deviations: Step-to pattern, Wide base of support, Trunk flexed, Knee flexed in stance - right, Knee flexed in stance - left Gait velocity: decreased     General Gait Details:  (10' x 2, 5' x 1)   Stairs             Wheelchair Mobility     Tilt Bed    Modified Rankin (Stroke Patients Only)       Balance Overall balance assessment: Needs assistance, History of Falls Sitting-balance support: No upper extremity supported, Feet supported Sitting balance-Leahy Scale: Fair     Standing balance support: Bilateral upper extremity supported, During functional activity, Reliant on assistive device for balance Standing balance-Leahy Scale: Poor Standing balance comment: RW and external support with close chair follow                            Communication Communication Communication: No apparent difficulties  Cognition Arousal: Alert Behavior During Therapy: WFL for tasks assessed/performed   PT - Cognitive impairments: No apparent impairments  PT - Cognition Comments: Very pleasant and agreeable to session however lacks awareness of his deficits and safety concerns Following commands: Intact      Cueing Cueing Techniques: Verbal cues, Tactile cues  Exercises Other Exercises Other Exercises:  (Pt educated on donning bilateral post-op shoes for  mobility and Left Prevalon boot when in bed)    General Comments General comments (skin integrity, edema, etc.): Skin breakdown on lower back and buttocks, Left heel wound      Pertinent Vitals/Pain Pain Assessment Pain Assessment: Faces Faces Pain Scale: Hurts little more Pain Location: L heel to touch, buttocks Pain Descriptors / Indicators: Grimacing, Tender, Sore Pain Intervention(s): Limited activity within patient's tolerance    Home Living                          Prior Function            PT Goals (current goals can now be found in the care plan section) Acute Rehab PT Goals Patient Stated Goal: get to a better rehab this time    Frequency    Min 2X/week      PT Plan      Co-evaluation PT/OT/SLP Co-Evaluation/Treatment: Yes   PT goals addressed during session: Mobility/safety with mobility;Balance;Proper use of DME OT goals addressed during session: Proper use of Adaptive equipment and DME;ADL's and self-care      AM-PAC PT 6 Clicks Mobility   Outcome Measure  Help needed turning from your back to your side while in a flat bed without using bedrails?: A Lot Help needed moving from lying on your back to sitting on the side of a flat bed without using bedrails?: A Lot Help needed moving to and from a bed to a chair (including a wheelchair)?: A Lot Help needed standing up from a chair using your arms (e.g., wheelchair or bedside chair)?: A Lot Help needed to walk in hospital room?: Total Help needed climbing 3-5 steps with a railing? : Total 6 Click Score: 10    End of Session Equipment Utilized During Treatment: Gait belt Activity Tolerance: Patient limited by fatigue Patient left: in chair;with call bell/phone within reach;with chair alarm set Nurse Communication: Mobility status PT Visit Diagnosis: Repeated falls (R29.6);Muscle weakness (generalized) (M62.81);History of falling (Z91.81);Difficulty in walking, not elsewhere classified  (R26.2);Pain Pain - Right/Left: Left Pain - part of body:  (heel and buttocks)     Time: 8864-8794 PT Time Calculation (min) (ACUTE ONLY): 30 min  Charges:    $Therapeutic Activity: 8-22 mins PT General Charges $$ ACUTE PT VISIT: 1 Visit                    Darice Bohr, PTA  Darice JAYSON Bohr 08/09/2024, 1:00 PM

## 2024-08-09 NOTE — Plan of Care (Signed)

## 2024-08-09 NOTE — Progress Notes (Signed)
 Progress Note   Patient: [name] FMW:[id number] DOB: Sep 18, 1950 DOA: 07/24/2024     15 DOS: the patient was seen and examined on 08/09/2024   Brief hospital course: 73 y.o. male with medical history significant of metastatic prostate cancer, type 2 diabetes mellitus, arthritis, hypertension and hyperlipidemia.  He presents to the hospital with low sugar.  He states his sister found him on the floor.  His sugar was 36.  Feels a little weak.  He states that he has been taking around 20 units of Lantus  and supposed to take 10.  He has also been taking 12 units of Humalog and was supposed to take 4.  He states he cannot see very well.  He draws up his own insulin  and injects his own insulin .  When he has a low sugar he feels out of it.  In the ER his sugar was in the 50s and 60s.  Hospitalist services contacted for further evaluation.  Patient also states he has an ulcer of his right foot that has been draining a little bit.  No fever chills or sweats.  He finished up radiation treatment for metastatic prostate cancer.  Follows with Dr. Ashley for his foot.  Patient put on D5W and IV antibiotics.  11/24.  Patient taken off D5W drip and will watch sugars another day.  Concerned about his vision and drawing up insulin .  Wondering if I can do pills instead.  MRI right foot did not show osteomyelitis.  Podiatry saw patient and recommended outpatient follow-up. 11/25.  Physical therapy recommending rehab from yesterday.  Case discussed with diabetes coordinator and will do Tradjenta  and Glucophage  and get rid of insulin .  Podiatry was okay with how the wounds look.  Will need close follow-up as outpatient. 11/25 continued.  Patient spiked a fever of 100.7.  Treat as neutropenic fever.  Blood cultures, urine culture and chest x-ray ordered.  Maxipime  ordered.  Continue doxycycline .  11/26: Maximum temperature recorded of 102.7 over the past 24-hour, worsening leukopenia at 0.9-ordered another dose of  Granix .  Preliminary blood cultures negative, checking respiratory viral panel, negative COVID, influenza and RSV.  Continuing cefepime  for now.  11/27: Afebrile over the past 24-hour, WBC at 1.0 and neutrophils of 0.  Discussed with Dr. Jacobo from oncology, who advised to continue Granix  until neutrophil of 1000.  Another dose of 480 mg ordered today.  Mild hypophosphatemia which is being repleted.  11/28: Remained afebrile.  WBC still at 1 with neutrophil of 0, continuing Granix , phosphorus improved to 2.4-giving another dose. Foot wound does not look infected.  11/29: Remained afebrile, WBC still at 0.9 with neutrophils of 0, case was discussed again with Dr. Jacobo who advised to discontinue antibiotics and continue with Granix , continue holding Erleada .  11/30: Remained afebrile and hemodynamically stable, slowly worsening leukopenia now WBC at 0.8, neutrophil remains 0.  Case was discussed again with Dr. Billi to continue with Granix  and he will discuss with his oncologist Dr. Rennie tomorrow as he might need bone marrow biopsy.  12/1: Remained afebrile but WBC still at 0.8 with 0 neutrophil.  Bone marrow biopsy scheduled for tomorrow.  12/2: Hemodynamically stable, no change in cell count.  S/p bone marrow biopsy today.  Insurance authorization obtained for SNF, message sent to Dr. Rennie when he thinks he will be safe to move as neutrophil remains 0.  12/5.  Blood pressure on the lower side today will start midodrine .  White blood cell count still low at  0.9 and neutrophil count remains at 0. 12/6.  White blood cell count still low at 0.8 and neutrophil count remains at 0.   12/7.  Patient spiked a fever in the afternoon and started on Maxipime .  Blood cultures, urine and stool ordered. 12/8.  Patient still with low white count of 1 and neutrophil count remains at 0.  Continue empiric antibiotic 12/9.  Patient received first dose of IVIG and steroids yesterday.   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  No further fever.  Continue IV antibiotics today and switch over to oral amoxicillin  tomorrow.  Assessment and Plan: * Neutropenic fever Patient on empiric Maxipime  and vancomycin .  No further fever.  Blood cultures negative for 2 days, urine looks negative.  Will change over to oral amoxicillin  tomorrow.  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  First dose of IVIG given last night and second dose tonight.  1 week of prednisone  also ordered.  When white count comes up patient will be stable for discharge.  White blood cell count 0.5 with a neutrophil count of 0.  Hypoglycemia On admission.  Likely secondary to taking too much insulin  and poor vision on drawing up his insulin .  Initially placed on D5 drip.  Taken off D5 drip on 11/24.  Continue Tradjenta  and Glucophage  to maintain sugars.  Patient will not need insulin  upon discharge.  Will start on sliding scale secondary to prednisone .  Diabetic foot infection [facility]) Appreciate podiatry consultation.  MRI does not show any osteomyelitis.  Received antibiotics during the hospital course.  Will switch over to oral amoxicillin  tomorrow.  Will need close follow-up as outpatient. Continue with wound care  Decubitus ulcer of left heel, unstageable (HCC) heel protector.  Hypotension Continue low-dose midodrine .  Hold blood pressure medication.  Prostate cancer metastatic to multiple sites [facility]) Patient states he has finished up radiation therapy.  Patient on apalutamide  (holding here).  Patient's PSA down to 63.51  Hyperlipidemia, unspecified Continue Lipitor  Anemia of chronic disease Hemoglobin 7.7  Obesity, Class III, BMI 40-49.9 (morbid obesity) (HCC) BMI 40.31 with only height and weight in computer.  Low serum vitamin B12 IM B12 followed by oral B12  Pressure injury Wound 07/25/24 Pressure Injury Heel Left Unstageable - Full thickness tissue  loss in which the base of the injury is covered by slough (yellow, tan, gray, green or brown) and/or eschar (tan, brown or black) in the wound bed. (Active)   Bilateral buttocks stage II with red moist macerated skin and patchy areas of partial-thickness loss consistent with moisture associated skin damage.  Both present on admission.            Subjective: Patient feels okay.  No further fever.  Admitted initially with hypoglycemia and foot ulcer but then has leukopenia.  No further diarrhea.  Physical Exam: Vitals:   08/09/24 0217 08/09/24 0326 08/09/24 0455 08/09/24 0715  BP: 101/61 108/62 (!) 106/56 (!) 102/55  Pulse: 83 80 80 77  Resp: 20 20 20 17   Temp: (!) 97.4 F (36.3 C) 97.6 F (36.4 C) 97.6 F (36.4 C) 98.2 F (36.8 C)  TempSrc: Oral   Oral  SpO2: 96% 94% 98% 97%  Weight:      Height:       Physical Exam HENT:     Head: Normocephalic.  Eyes:     General: Lids are normal.  Cardiovascular:     Rate and Rhythm: Normal rate and regular rhythm.  Heart sounds: Normal heart sounds, S1 normal and S2 normal.  Pulmonary:     Breath sounds: No decreased breath sounds, wheezing, rhonchi or rales.  Abdominal:     Palpations: Abdomen is soft.     Tenderness: There is no abdominal tenderness.  Musculoskeletal:     Right lower leg: Swelling present.     Left lower leg: Swelling present.  Skin:    General: Skin is warm.     Comments: Chronic lower extremity skin discoloration.  Neurological:     Mental Status: He is alert.     Data Reviewed: Creatinine 0.76, white blood count 0.5, hemoglobin 7.7, platelet count 342, neutrophil count 0 Family Communication: Updated sister on the phone  Disposition: Status is: Inpatient Remains inpatient appropriate because: Would like to see white blood cell count,.  Still treating for neutropenic fever.  Oncology ordered IVIG second dose tonight.  Oncology ordered prednisone .  Planned Discharge Destination: Rehab    Time  spent: 28 minutes  Author: Charlie Patterson, MD 08/09/2024 10:59 AM  For on call review [email or web address].

## 2024-08-09 NOTE — Progress Notes (Signed)
 Occupational Therapy Treatment Patient Details Name: [name] MRN: [id number] DOB: 1951-04-12 Today's Date: 08/09/2024   History of present illness Pt is a 73 year old male admitted with hypoglycemia, diabetic foot infection, decubitus ulcer of L heel, hypotension     PMH significant for metastatic prostate cancer, type 2 diabetes mellitus, arthritis, hypertension and hyperlipidemia.   OT comments  Pt is supine in bed on arrival. Pleasant and agreeable to OT session. He has tenderness to buttocks during cleanup from skin breakdown. He continues to require +2 assist for all bed mobility, transfers and mobility with increased fatigue with all activity. Max A for all LB bathing/dressing in standing. Remains motivated, but very weak and limited. Pt left in recliner with all needs in place and will cont to require skilled acute OT services to maximize his safety and IND to return to PLOF.       If plan is discharge home, recommend the following:  A lot of help with walking and/or transfers;A lot of help with bathing/dressing/bathroom;Assistance with cooking/housework;Help with stairs or ramp for entrance   Equipment Recommendations  Other (comment) (defer to next venue)    Recommendations for Other Services      Precautions / Restrictions Precautions Precautions: Fall Recall of Precautions/Restrictions: Intact Required Braces or Orthoses: Other Brace Other Brace: prevalon boot L foot in bed (Prevalon boot Left foot when in bed) Restrictions Weight Bearing Restrictions Per Provider Order: Yes RLE Weight Bearing Per Provider Order: Weight bearing as tolerated LLE Weight Bearing Per Provider Order: Weight bearing as tolerated Other Position/Activity Restrictions: WBAT to BLEs, however provided post op shoes for wound protection       Mobility Bed Mobility Overal bed mobility: Needs Assistance Bed Mobility: Supine to Sit     Supine to sit: Mod assist, +2 for physical  assistance, HOB elevated, Used rails     General bed mobility comments: increased time and effort with soiled linens and pt with notable skin breakdown-nurse aware    Transfers Overall transfer level: Needs assistance Equipment used: Rolling walker (2 wheels) Transfers: Sit to/from Stand Sit to Stand: Mod assist, +2 physical assistance           General transfer comment: increased assist +2 to stand from EOB and recliner this date multiple bouts; +2 for chair follow/safety with short distance ambulation     Balance Overall balance assessment: Needs assistance, History of Falls Sitting-balance support: No upper extremity supported, Feet supported Sitting balance-Leahy Scale: Fair     Standing balance support: Bilateral upper extremity supported, During functional activity, Reliant on assistive device for balance Standing balance-Leahy Scale: Poor Standing balance comment: RW and external support with close chair follow                           ADL either performed or assessed with clinical judgement   ADL Overall ADL's : Needs assistance/impaired             Lower Body Bathing: Maximal assistance;Sit to/from stand;Sitting/lateral leans Lower Body Bathing Details (indicate cue type and reason): peri-care/buttocks and back in standing in front of recliner Upper Body Dressing : Minimal assistance;Sitting Upper Body Dressing Details (indicate cue type and reason): doff/donn gown Lower Body Dressing: Maximal assistance;Sitting/lateral leans Lower Body Dressing Details (indicate cue type and reason): donn socks and bil post op shoes             Functional mobility during ADLs: Minimal assistance;Rolling walker (2  wheels);Moderate assistance;+2 for safety/equipment      Extremity/Trunk Assessment              Vision       Perception     Praxis     Communication Communication Communication: No apparent difficulties   Cognition Arousal:  Alert Behavior During Therapy: WFL for tasks assessed/performed                                 Following commands: Intact        Cueing   Cueing Techniques: Verbal cues, Tactile cues  Exercises      Shoulder Instructions       General Comments skin breakdown noted to lower back, buttocks and L heel    Pertinent Vitals/ Pain       Pain Assessment Pain Assessment: Faces Faces Pain Scale: Hurts little more Pain Location: L heel to touch, buttocks Pain Descriptors / Indicators: Grimacing, Tender, Sore Pain Intervention(s): Monitored during session, Repositioned, Limited activity within patient's tolerance  Home Living                                          Prior Functioning/Environment              Frequency  Min 2X/week        Progress Toward Goals  OT Goals(current goals can now be found in the care plan section)  Progress towards OT goals: Progressing toward goals  Acute Rehab OT Goals Patient Stated Goal: get stronger OT Goal Formulation: With patient Time For Goal Achievement: 08/23/24 Potential to Achieve Goals: Fair  Plan      Co-evaluation    PT/OT/SLP Co-Evaluation/Treatment: Yes   PT goals addressed during session: Mobility/safety with mobility;Balance;Proper use of DME OT goals addressed during session: Proper use of Adaptive equipment and DME;ADL's and self-care      AM-PAC OT 6 Clicks Daily Activity     Outcome Measure   Help from another person eating meals?: A Little Help from another person taking care of personal grooming?: A Little Help from another person toileting, which includes using toliet, bedpan, or urinal?: A Lot Help from another person bathing (including washing, rinsing, drying)?: A Lot Help from another person to put on and taking off regular upper body clothing?: A Little Help from another person to put on and taking off regular lower body clothing?: A Lot 6 Click Score: 15     End of Session Equipment Utilized During Treatment: Rolling walker (2 wheels);Gait belt  OT Visit Diagnosis: Other abnormalities of gait and mobility (R26.89);Muscle weakness (generalized) (M62.81)   Activity Tolerance Patient tolerated treatment well   Patient Left with call bell/phone within reach;in chair;with chair alarm set   Nurse Communication Mobility status        Time: 8864-8794 OT Time Calculation (min): 30 min  Charges: OT General Charges $OT Visit: 1 Visit OT Treatments $Self Care/Home Management : 8-22 mins  Ciclaly Mulcahey Chrismon, OTR/L  08/09/24, 1:36 PM   Shailen Thielen E Chrismon 08/09/2024, 1:34 PM

## 2024-08-09 NOTE — Progress Notes (Signed)
  Chaplain On-Call responded to a page from [employer] who reported the patient's request for prayer through a family member.  Chaplain met the patient; no family was present.  Chaplain provided prayer and spiritual and emotional support as the patient described many challenges of living with metastatic prostate cancer. 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 He stated that he feels peace in his spiritual relationship due to his many experiences of God's grace and peace in his life.  Chaplain Bebe Ardean EMERSON Hershal., [facility]

## 2024-08-10 LAB — CBC WITH DIFFERENTIAL/PLATELET
Abs Immature Granulocytes: 0.01 K/uL (ref 0.00–0.07)
Basophils Absolute: 0 K/uL (ref 0.0–0.1)
Basophils Relative: 1 %
Eosinophils Absolute: 0 K/uL (ref 0.0–0.5)
Eosinophils Relative: 0 %
HCT: 21.8 % — ABNORMAL LOW (ref 39.0–52.0)
Hemoglobin: 7.6 g/dL — ABNORMAL LOW (ref 13.0–17.0)
Immature Granulocytes: 1 %
Lymphocytes Relative: 24 %
Lymphs Abs: 0.2 K/uL — ABNORMAL LOW (ref 0.7–4.0)
MCH: 30.9 pg (ref 26.0–34.0)
MCHC: 34.9 g/dL (ref 30.0–36.0)
MCV: 88.6 fL (ref 80.0–100.0)
Monocytes Absolute: 0.6 K/uL (ref 0.1–1.0)
Monocytes Relative: 73 %
Neutro Abs: 0 K/uL — CL (ref 1.7–7.7)
Neutrophils Relative %: 1 %
Platelets: 366 K/uL (ref 150–400)
RBC: 2.46 MIL/uL — ABNORMAL LOW (ref 4.22–5.81)
RDW: 18.3 % — ABNORMAL HIGH (ref 11.5–15.5)
Smear Review: NORMAL
WBC: 0.8 K/uL — CL (ref 4.0–10.5)
nRBC: 0 % (ref 0.0–0.2)

## 2024-08-10 LAB — GLUCOSE, CAPILLARY
Glucose-Capillary: 198 mg/dL — ABNORMAL HIGH (ref 70–99)
Glucose-Capillary: 182 mg/dL — ABNORMAL HIGH (ref 70–99)
Glucose-Capillary: 196 mg/dL — ABNORMAL HIGH (ref 70–99)
Glucose-Capillary: 252 mg/dL — ABNORMAL HIGH (ref 70–99)
Glucose-Capillary: 285 mg/dL — ABNORMAL HIGH (ref 70–99)

## 2024-08-10 NOTE — Plan of Care (Signed)
  Problem: Clinical Measurements: Goal: Will remain free from infection Outcome: Progressing   Problem: Nutrition: Goal: Adequate nutrition will be maintained Outcome: Progressing   Problem: Coping: Goal: Level of anxiety will decrease Outcome: Progressing   Problem: Elimination: Goal: Will not experience complications related to bowel motility Outcome: Progressing

## 2024-08-10 NOTE — Progress Notes (Signed)
 Physical Therapy Treatment Patient Details Name: [name] MRN: [id number] DOB: Jan 11, 1951 Today's Date: 08/10/2024   History of Present Illness Pt is a 73 year old male admitted with hypoglycemia, diabetic foot infection, decubitus ulcer of L heel, hypotension     PMH significant for metastatic prostate cancer, type 2 diabetes mellitus, arthritis, hypertension and hyperlipidemia.    PT Comments  Pt was short sitting in recliner upon arrival. He had just finished lunch and was motivated to attempt ambulation. Pt continues to require extensive assistance to stand from recliner to BRW. He tolerated gait only ~ 4 ft prior to requiring seated rest.  I'm always so weak after getting that medicine. (Referring to IVIG) Pt unwilling to attempt gait again but did fully participate in seated ther ex. See exercises performed listed below. Author also issued HEP handout for supine exercises to promote increased strength + activity tolerance. Overall, pt tolerated session well but remains limited by fatigue/weakness. DC recs remain appropriate to maximize independence and safety with all ADLs.    If plan is discharge home, recommend the following: A lot of help with walking and/or transfers;A lot of help with bathing/dressing/bathroom;Assistance with cooking/housework;Direct supervision/assist for medications management;Direct supervision/assist for financial management;Assist for transportation;Help with stairs or ramp for entrance;Supervision due to cognitive status     Equipment Recommendations  Other (comment) (Defer to next level of care)       Precautions / Restrictions Precautions Precautions: Fall Recall of Precautions/Restrictions: Intact Required Braces or Orthoses: Other Brace (Darco shoes bilaterally) Other Brace: prevalon boot L foot in bed Restrictions Weight Bearing Restrictions Per Provider Order: Yes RLE Weight Bearing Per Provider Order: Weight bearing as tolerated LLE Weight  Bearing Per Provider Order: Weight bearing as tolerated Other Position/Activity Restrictions: WBAT to BLEs, however provided post op shoes for wound protection     Mobility  Bed Mobility  General bed mobility comments: Pt was in recliner pre/post session    Transfers Overall transfer level: Needs assistance Equipment used: Rolling walker (2 wheels) Transfers: Sit to/from Stand Sit to Stand: Mod assist, Max assist  General transfer comment: Mod-max assist of one to stand from recliner to bariatric RW. Unable to stand on first attempt however with vcs for fwd wt shift,handplacement, and technique improvements was able to stand 2 x form recliner with +1 assist.    Ambulation/Gait Ambulation/Gait assistance: Min assist Gait Distance (Feet): 4 Feet Assistive device: Rolling walker (2 wheels) (BRW) Gait Pattern/deviations: Step-to pattern, Wide base of support, Trunk flexed, Knee flexed in stance - right, Knee flexed in stance - left Gait velocity: decreased  General Gait Details: Pt was able to ambulate ~ 4 ft with BRW + recliner follow for additional safety. Distance limited by fatigue. I always get weak after getting the medicine (IVIG).  Pt requested not to ambulate again but did perfrom BLEs ther ex in recliner. Author issued HEP handout for supine/bedlevel ther ex with pt perform alot of seated ther ex in recliner.    Balance Overall balance assessment: Needs assistance, History of Falls Sitting-balance support: No upper extremity supported, Feet supported Sitting balance-Leahy Scale: Fair     Standing balance support: Bilateral upper extremity supported, During functional activity, Reliant on assistive device for balance Standing balance-Leahy Scale: Poor Standing balance comment: RW and external support with close chair follow        Communication Communication Communication: No apparent difficulties  Cognition Arousal: Alert Behavior During Therapy: WFL for tasks  assessed/performed   PT - Cognitive  impairments: No family/caregiver present to determine baseline   PT - Cognition Comments: Very pleasant and agreeable to session however lacks awareness of his deficits and safety concerns Following commands: Intact      Cueing Cueing Techniques: Verbal cues, Tactile cues     General Comments General comments (skin integrity, edema, etc.): Pt performed seated ther ex in recliner 2 x 10 BLEs LAQ, marching, AP, and isometric hip add/abd      Pertinent Vitals/Pain Pain Assessment Pain Assessment: No/denies pain Pain Score: 0-No pain Pain Location: L heel to touch, buttocks Pain Descriptors / Indicators: Grimacing, Tender, Sore Pain Intervention(s): Limited activity within patient's tolerance, Monitored during session, Premedicated before session, Repositioned     PT Goals (current goals can now be found in the care plan section) Acute Rehab PT Goals Patient Stated Goal: rehab then home Progress towards PT goals: Not progressing toward goals - comment    Frequency    Min 2X/week       Co-evaluation     PT goals addressed during session: Mobility/safety with mobility;Balance;Proper use of DME;Strengthening/ROM        AM-PAC PT 6 Clicks Mobility   Outcome Measure  Help needed turning from your back to your side while in a flat bed without using bedrails?: A Lot Help needed moving from lying on your back to sitting on the side of a flat bed without using bedrails?: A Lot Help needed moving to and from a bed to a chair (including a wheelchair)?: A Lot Help needed standing up from a chair using your arms (e.g., wheelchair or bedside chair)?: A Lot Help needed to walk in hospital room?: Total Help needed climbing 3-5 steps with a railing? : Total 6 Click Score: 10    End of Session   Activity Tolerance: Patient limited by fatigue Patient left: in chair;with call bell/phone within reach;with chair alarm set Nurse Communication:  Mobility status PT Visit Diagnosis: Repeated falls (R29.6);Muscle weakness (generalized) (M62.81);History of falling (Z91.81);Difficulty in walking, not elsewhere classified (R26.2);Pain Pain - Right/Left: Left Pain - part of body: Ankle and joints of foot     Time: 1255-1312 PT Time Calculation (min) (ACUTE ONLY): 17 min  Charges:    $Therapeutic Activity: 8-22 mins PT General Charges $$ ACUTE PT VISIT: 1 Visit                     Rankin Essex PTA 08/10/24, 1:39 PM

## 2024-08-10 NOTE — Progress Notes (Signed)
 PROGRESS NOTE    [name]   FMW:[id number] DOB: 01/14/1951  DOA: 07/24/2024 Date of Service: 08/10/24 which is hospital day 16  PCP: Adina Buel HERO, MD    Hospital course / significant events:   HPI: 73 y.o. male with medical history significant of metastatic prostate cancer, type 2 diabetes mellitus, arthritis, hypertension and hyperlipidemia.  He presents to the hospital with low sugar.  He states his sister found him on the floor.  His sugar was 36.  He states that he has been taking around 20 units of Lantus  and supposed to take 10.  He has also been taking 12 units of Humalog and was supposed to take 4.  He states he cannot see very well.  He draws up his own insulin  and injects his own insulin .  11/23: In the ER his sugar was in the 50s and 60s.  Hospitalist services contacted for further evaluation.  Patient also states he has an ulcer of his right foot that has been draining a little bit.  No fever chills or sweats.  He finished up radiation treatment for metastatic prostate cancer.  Follows with Dr. Ashley for his foot.  Patient put on D5W and IV antibiotics. 11/24.  Patient taken off D5W drip and will watch sugars another day.  Concerned about his vision and drawing up insulin .  Wondering if he can do pills instead.  MRI right foot did not show osteomyelitis.  Podiatry saw patient and recommended outpatient follow-up. 11/25.  Physical therapy recommending rehab from yesterday.  Case discussed with diabetes coordinator and will do Tradjenta  and Glucophage  and get rid of insulin .  11/25  Patient spiked a fever of 100.7.  Treating as neutropenic fever.  Blood cultures, urine culture and chest x-ray ordered.  Maxipime  ordered.  Continue doxycycline . 11/26: Maximum temperature recorded of 102.7 over the past 24-hour, worsening leukopenia at 0.9-ordered another dose of Granix .  Preliminary blood cultures negative, checking respiratory viral panel, negative COVID, influenza and RSV.   Continuing cefepime  for now. 11/27: Afebrile over the past 24-hour, WBC at 1.0 and neutrophils of 0.  Discussed with Dr. Jacobo from oncology, advised to continue Granix  until neutrophil of 1000.  Another dose of 480 mg ordered today.  11/28: Remained afebrile.  WBC still at 1 with neutrophil of 0, continuing Granix , phosphorus improved to 2.4-giving another dose. Foot wound does not look infected. 11/29: Remained afebrile, WBC still at 0.9 with neutrophils of 0, case was discussed again with Dr. Jacobo who advised to discontinue antibiotics and continue with Granix , continue holding Erleada . 11/30: Remained afebrile and hemodynamically stable, slowly worsening leukopenia now WBC at 0.8, neutrophil remains 0.  Case was discussed again with Dr. Jacobo - continue with Granix  and he will discuss with his primary oncologist Dr. Rennie tomorrow as he might need bone marrow biopsy. 12/01: Remained afebrile but WBC still at 0.8 with 0 neutrophil.  Bone marrow biopsy scheduled for tomorrow. 12/02: S/p bone marrow biopsy today.  Insurance authorization obtained for SNF, message sent to Dr. Rennie when he thinks he will be safe to move as neutrophil remains 0. 12/05.  Blood pressure on the lower side today will start midodrine .  White blood cell count still low at 0.9 and neutrophil count remains at 0. 12/06.  White blood cell count still low at 0.8 and neutrophil count remains at 0.   12/07.  Patient spiked a fever in the afternoon and started on Maxipime .  Blood cultures, urine and stool ordered.  12/08.  Patient still with low white count of 1 and neutrophil count remains at 0.  Continue empiric antibiotic. Per onc - bone marrow biopsy-no obvious evidence of MDS or leukemia to explain the significant leukopenia/neutropenia. Suspect acute bone marrow insult-likely viral infection. Bone marrow also notes to have involvement by prostate cancer-however clinically not suggestive of cause of patient's  neutropenia.  12/09.  Afebrile. Patient received first dose of IVIG and steroids yesterday.  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 IV antibiotics today and switch to po amoxicillin  tomorrow. 12/10: afebrile. WBC 0.8, Neut 0.    Consultants:  Podiatry Oncology   Procedures/Surgeries: none     ASSESSMENT & PLAN:   Neutropenic fever Patient has been on empiric Maxipime  and vancomycin .   No further fever few days.   Blood cultures negative for 2 days, urine negative.   oral amoxicillin  today. Oncology following     Leukopenia  White blood cell count 0.5 with a neutrophil count of 0. 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   S/p 2 doses IVIG 1 week of prednisone  also ordered.   When white count comes up patient will be stable for discharge.  Oncology following    Hypoglycemia On admission.   secondary to taking too much insulin  and poor vision on drawing up his insulin .   Tradjenta  and Glucophage  maintenance meds  will not need insulin  upon discharge.   sliding scale secondary to prednisone .   Diabetic foot infection  Osteomyelitis ruled OUT Appreciate podiatry consultation.   oral amoxicillin   Podiatry follow up outpatient. Wound care    Decubitus ulcer of left heel, unstageable heel protector. Podiatry follow up outpatient. Wound care    Hypotension Continue low-dose midodrine .   Hold antihypertensives    Prostate cancer metastatic to multiple sites Patient states he has finished up radiation therapy.   Patient's PSA down to 63.51 on apalutamide  (holding here) Oncology follow up .     Hyperlipidemia, unspecified Continue Lipitor   Anemia of chronic disease Hemoglobin 7.7 Oncology following   Low serum vitamin B12 IM B12 followed by oral B12   Pressure injury - present on admission. Wound 07/25/24 Pressure Injury Heel Left Unstageable - Full thickness tissue loss in which the base of the injury is  covered by slough (yellow, tan, gray, green or brown) and/or eschar (tan, brown or black) in the wound bed. (Active)  Bilateral buttocks stage II with red moist macerated skin and patchy areas of partial-thickness loss consistent with moisture associated skin damage. Wound care      Class 3 obesity based on BMI: Body mass index is 40.31 kg/m.SABRA Significantly low or high BMI is associated with higher medical risk.  Underweight - under 18  overweight - 25 to 29 obese - 30 or more Class 1 obesity: BMI of 30.0 to 34 Class 2 obesity: BMI of 35.0 to 39 Class 3 obesity: BMI of 40.0 to 49 Super Morbid Obesity: BMI 50-59 Super-super Morbid Obesity: BMI 60+ Healthy nutrition and physical activity advised as adjunct to other disease management and risk reduction treatments    DVT prophylaxis: lovenox  IV fluids: no continuous IV fluids  Nutrition: cardiac/carb diet  Central lines / other devices: none  Code Status: FULL CODE ACP documentation reviewed: has goals of care documentation but no formal legal docs / orders on file in [facility]  [facility] needs: SNF placement - [facility] to confirm when medically stable Medical barriers to dispo: leukopenia / neutropenia. Expected medical readiness  for discharge next few dqys if continues to trend appropriately.              Subjective / Brief ROS:  Patient reports no complaints today  Denies CP/SOB.  Pain controlled.  Denies new weakness.  Tolerating diet.  Reports no concerns w/ urination/defecation.   Family Communication: none at bedside     Objective Findings:  Vitals:   08/10/24 0306 08/10/24 0725 08/10/24 1200 08/10/24 1534  BP: 117/65 109/62 106/62 (!) 104/59  Pulse: 80 75 92 94  Resp: (!) 24 18  18   Temp: 97.9 F (36.6 C) 97.8 F (36.6 C)  98 F (36.7 C)  TempSrc: Oral Oral  Oral  SpO2: 99% 96%  100%  Weight:      Height:        Intake/Output Summary (Last 24 hours) at 08/10/2024 1544 Last data filed at  08/10/2024 1443 Gross per 24 hour  Intake 620 ml  Output 900 ml  Net -280 ml   Filed Weights   07/24/24 1550  Weight: 131.1 kg    Examination:  Physical Exam Constitutional:      General: He is not in acute distress. Cardiovascular:     Rate and Rhythm: Normal rate and regular rhythm.  Pulmonary:     Effort: Pulmonary effort is normal.     Breath sounds: Normal breath sounds.  Musculoskeletal:     Right lower leg: No edema.     Left lower leg: No edema.  Skin:    General: Skin is warm and dry.  Neurological:     Mental Status: He is alert. Mental status is at baseline.  Psychiatric:        Mood and Affect: Mood normal.        Behavior: Behavior normal.          Scheduled Medications:   amoxicillin   500 mg Oral Q8H   aspirin  EC  81 mg Oral Daily   atorvastatin   10 mg Oral QPM   collagenase    Topical Daily   vitamin B-12  1,000 mcg Oral Daily   enoxaparin  (LOVENOX ) injection  65 mg Subcutaneous Q24H   filgrastim  (NIVESTYM ) SQ  480 mcg Subcutaneous Daily   Gerhardt's butt cream   Topical BID   insulin  aspart  0-5 Units Subcutaneous QHS   insulin  aspart  0-6 Units Subcutaneous TID WC   linagliptin   5 mg Oral Daily   LORazepam   1 mg Oral Q8H   metFORMIN   1,000 mg Oral BID WC   midodrine   5 mg Oral TID WC   predniSONE   60 mg Oral Q breakfast    Continuous Infusions:  sodium PHOSPHATE  IVPB (in mmol) Stopped (08/10/24 0757)    PRN Medications:  acetaminophen  **OR** acetaminophen , loperamide , ondansetron  **OR** ondansetron  (ZOFRAN ) IV  Antimicrobials from admission:  Anti-infectives (From admission, onward)    Start     Dose/Rate Route Frequency Ordered Stop   08/10/24 0600  amoxicillin  (AMOXIL ) capsule 500 mg        500 mg Oral Every 8 hours 08/09/24 1102     08/09/24 0400  vancomycin  (VANCOREADY) IVPB 1250 mg/250 mL        1,250 mg 166.7 mL/hr over 90 Minutes Intravenous Every 12 hours 08/08/24 1159 08/09/24 1734   08/08/24 1245  vancomycin   (VANCOREADY) IVPB 2000 mg/400 mL       Placed in Followed by Linked Group   2,000 mg 200 mL/hr over 120 Minutes Intravenous  Once 08/08/24 1152 08/08/24 1534  08/08/24 1245  vancomycin  (VANCOREADY) IVPB 500 mg/100 mL       Placed in Followed by Linked Group   500 mg 100 mL/hr over 60 Minutes Intravenous  Once 08/08/24 1152 08/08/24 1715   08/07/24 1100  ceFEPIme  (MAXIPIME ) 2 g in sodium chloride  0.9 % 100 mL IVPB        2 g 200 mL/hr over 30 Minutes Intravenous Every 8 hours 08/07/24 0948 08/09/24 2026   07/26/24 2200  doxycycline  (VIBRA -TABS) tablet 100 mg  Status:  Discontinued        100 mg Oral Every 12 hours 07/26/24 1542 07/27/24 1628   07/26/24 1630  ceFEPIme  (MAXIPIME ) 2 g in sodium chloride  0.9 % 100 mL IVPB  Status:  Discontinued        2 g 200 mL/hr over 30 Minutes Intravenous Every 8 hours 07/26/24 1541 07/30/24 1434   07/26/24 1000  doxycycline  (VIBRA -TABS) tablet 100 mg  Status:  Discontinued        100 mg Oral Every 12 hours 07/26/24 0704 07/26/24 1541   07/25/24 0800  vancomycin  (VANCOCIN ) IVPB 1000 mg/200 mL premix  Status:  Discontinued        1,000 mg 200 mL/hr over 60 Minutes Intravenous Every 12 hours 07/24/24 1812 07/26/24 0704   07/24/24 2015  vancomycin  (VANCOCIN ) IVPB 1000 mg/200 mL premix       Placed in Followed by Linked Group   1,000 mg 200 mL/hr over 60 Minutes Intravenous  Once 07/24/24 1807 07/25/24 0045   07/24/24 1815  cefTRIAXone  (ROCEPHIN ) 2 g in sodium chloride  0.9 % 100 mL IVPB  Status:  Discontinued        2 g 200 mL/hr over 30 Minutes Intravenous Every 24 hours 07/24/24 1804 07/26/24 0704   07/24/24 1815  vancomycin  (VANCOREADY) IVPB 1500 mg/300 mL       Placed in Followed by Linked Group   1,500 mg 150 mL/hr over 120 Minutes Intravenous  Once 07/24/24 1807 07/24/24 2249           Data Reviewed:  I have personally reviewed the following...  CBC: Recent Labs  Lab 08/05/24 0616 08/06/24 0602 08/07/24 0504 08/08/24 0539  08/09/24 0531 08/10/24 0447  WBC 0.9* 0.8* 1.1* 1.0* 0.5* 0.8*  NEUTROABS 0.0* 0.0*  --  0.0* 0.0* 0.0*  HGB 8.3* 8.2* 8.4* 8.2* 7.7* 7.6*  HCT 25.3* 25.0* 25.7* 24.5* 22.8* 21.8*  MCV 86.9 87.1 86.5 85.1 87.7 88.6  PLT 335 380 408* 376 342 366   Basic Metabolic Panel: Recent Labs  Lab 08/07/24 0504 08/09/24 0534  CREATININE 0.83 0.76   GFR: Estimated Creatinine Clearance: 111.8 mL/min (by C-G formula based on SCr of 0.76 mg/dL). Liver Function Tests: No results for input(s): AST, ALT, ALKPHOS, BILITOT, PROT, ALBUMIN in the last 168 hours. No results for input(s): LIPASE, AMYLASE in the last 168 hours. No results for input(s): AMMONIA in the last 168 hours. Coagulation Profile: Recent Labs  Lab 08/06/24 0602  INR 1.4*   Cardiac Enzymes: No results for input(s): CKTOTAL, CKMB, CKMBINDEX, TROPONINI in the last 168 hours. BNP (last 3 results) No results for input(s): PROBNP in the last 8760 hours. HbA1C: No results for input(s): HGBA1C in the last 72 hours. CBG: Recent Labs  Lab 08/09/24 1537 08/09/24 2142 08/10/24 0620 08/10/24 0820 08/10/24 1145  GLUCAP 189* 199* 198* 182* 196*   Lipid Profile: No results for input(s): CHOL, HDL, LDLCALC, TRIG, CHOLHDL, LDLDIRECT in the last 72 hours. Thyroid Function Tests: No  results for input(s): TSH, T4TOTAL, FREET4, T3FREE, THYROIDAB in the last 72 hours. Anemia Panel: No results for input(s): VITAMINB12, FOLATE, FERRITIN, TIBC, IRON , RETICCTPCT in the last 72 hours. Most Recent Urinalysis On File:     Component Value Date/Time   COLORURINE YELLOW (A) 08/08/2024 0930   APPEARANCEUR CLEAR (A) 08/08/2024 0930   LABSPEC 1.018 08/08/2024 0930   PHURINE 5.0 08/08/2024 0930   GLUCOSEU NEGATIVE 08/08/2024 0930   HGBUR NEGATIVE 08/08/2024 0930   BILIRUBINUR NEGATIVE 08/08/2024 0930   KETONESUR 5 (A) 08/08/2024 0930   PROTEINUR NEGATIVE 08/08/2024 0930   NITRITE  NEGATIVE 08/08/2024 0930   LEUKOCYTESUR NEGATIVE 08/08/2024 0930   Sepsis Labs: @LABRCNTIP (procalcitonin:4,lacticidven:4) Microbiology: Recent Results (from the past 240 hours)  Culture, blood (Routine X 2) w Reflex to ID Panel     Status: None (Preliminary result)   Collection Time: 08/07/24  9:49 AM   Specimen: BLOOD  Result Value Ref Range Status   Specimen Description BLOOD BLOOD RIGHT ARM  Final   Special Requests   Final    BOTTLES DRAWN AEROBIC AND ANAEROBIC Blood Culture adequate volume   Culture   Final    NO GROWTH 3 DAYS Performed at [facility], [address] [place], [place] [address]    Report Status PENDING  Incomplete  Culture, blood (Routine X 2) w Reflex to ID Panel     Status: None (Preliminary result)   Collection Time: 08/07/24  9:58 AM   Specimen: BLOOD  Result Value Ref Range Status   Specimen Description BLOOD BLOOD LEFT HAND  Final   Special Requests   Final    BOTTLES DRAWN AEROBIC AND ANAEROBIC Blood Culture adequate volume   Culture   Final    NO GROWTH 3 DAYS Performed at [facility], [address], [place], [place] [address]    Report Status PENDING  Incomplete      Radiology Studies last 3 days: No results found.        Braycen Burandt, DO [facility] 08/10/2024, 3:44 PM    Dictation software may have been used to generate the above note. Typos may occur and escape review in typed/dictated notes. Please contact Dr Marsa directly for clarity if needed.  Staff may message me via secure chat in Epic  but this may not receive an immediate response,  please page me for urgent matters!  If 7PM-7AM, please contact night coverage [email or web address]

## 2024-08-10 NOTE — TOC Progression Note (Signed)
 Transition of Care [facility] - Progression Note    Patient Details  Name: [name] MRN: [id number] Date of Birth: 02-Nov-1950  Transition of Care [facility]) CM/SW Contact  Dalia GORMAN Fuse, RN Phone Number: 08/10/2024, 8:37 AM  Clinical Narrative:     WBC .08, received 2 doses of IVIG. Plan to switch to oral abx today.  Plan for [facility] when medically ready. No TOC needs.                    Expected Discharge Plan and Services                                               Social Drivers of Health (SDOH) Interventions SDOH Screenings   Food Insecurity: No Food Insecurity (07/29/2024)  Housing: High Risk (07/29/2024)  Transportation Needs: No Transportation Needs (07/29/2024)  Utilities: Not At Risk (07/29/2024)  Depression (PHQ2-9): Low Risk  (07/07/2024)  Financial Resource Strain: Low Risk  (08/28/2023)   Received from [facility]  Recent Concern: Financial Resource Strain - Medium Risk (08/06/2023)   [facility]  Physical Activity: Inactive (08/28/2023)   Received from [facility]  Social Connections: Moderately Integrated (07/29/2024)  Stress: No Stress Concern Present (08/28/2023)   Received from [facility]  Tobacco Use: Medium Risk (07/24/2024)  Health Literacy: Adequate Health Literacy (08/28/2023)   Received from [facility]    Readmission Risk Interventions     No data to display

## 2024-08-10 NOTE — Plan of Care (Signed)

## 2024-08-10 NOTE — Progress Notes (Signed)
 Mobility Specialist - Progress Note     08/10/24 1400  Mobility  Activity Respositioned in chair  Level of Assistance Standby assist, set-up cues, supervision of patient - no hands on  Assistive Device Front wheel walker  Distance Ambulated (ft) 3 ft  Range of Motion/Exercises Active;All extremities  RLE Weight Bearing Per Provider Order WBAT  LLE Weight Bearing Per Provider Order WBAT  Activity Response Tolerated well  Mobility visit 1 Mobility  Mobility Specialist Start Time (ACUTE ONLY) 1310  Mobility Specialist Stop Time (ACUTE ONLY) 1323  Mobility Specialist Time Calculation (min) (ACUTE ONLY) 13 min   Pt was in the recliner upon entry. Pt agreed to mobility. Pt was able to perform activities of all extremities. Pt was able to STS with 2 WW and minA CGA. After activity pt repositioned in the recliner with needs in reach and chair alarm on.  Clem Rodes Mobility Specialist 08/10/24, 2:36 PM

## 2024-08-10 NOTE — Progress Notes (Signed)
 Pt completed second round of immune globulin  10% (PRIVIGEN ) IV infusion per pharmacy dosing instructions tolerated well with no adverse reactions noted or reported. Post one hour VS were stable. Education given to pt and to report any changes in conditions.

## 2024-08-10 NOTE — Progress Notes (Signed)
 Nikkolas Coomes Kellman   DOB:[birth year]   FM#:[id number]    Subjective:   Appetite fair.  No nausea or vomiting.  Patient status post IVIG when infusion.  No complications noted.  No further fevers noted.  Objective:  Vitals:   08/10/24 1200 08/10/24 1534  BP: 106/62 (!) 104/59  Pulse: 92 94  Resp:  18  Temp:  98 F (36.7 C)  SpO2:  100%     Intake/Output Summary (Last 24 hours) at 08/10/2024 1605 Last data filed at 08/10/2024 1443 Gross per 24 hour  Intake 620 ml  Output 900 ml  Net -280 ml    Physical Exam Vitals and nursing note reviewed.  HENT:     Head: Normocephalic and atraumatic.     Mouth/Throat:     Pharynx: Oropharynx is clear.  Eyes:     Extraocular Movements: Extraocular movements intact.     Pupils: Pupils are equal, round, and reactive to light.  Cardiovascular:     Rate and Rhythm: Normal rate and regular rhythm.  Pulmonary:     Comments: Decreased breath sounds bilaterally.  Abdominal:     Palpations: Abdomen is soft.  Musculoskeletal:        General: Normal range of motion.     Cervical back: Normal range of motion.  Skin:    General: Skin is warm.  Neurological:     General: No focal deficit present.     Mental Status: He is alert and oriented to person, place, and time.  Psychiatric:        Behavior: Behavior normal.        Judgment: Judgment normal.      Labs:  Lab Results  Component Value Date   WBC 0.8 (LL) 08/10/2024   HGB 7.6 (L) 08/10/2024   HCT 21.8 (L) 08/10/2024   MCV 88.6 08/10/2024   PLT 366 08/10/2024   NEUTROABS 0.0 (LL) 08/10/2024    Lab Results  Component Value Date   NA 135 08/01/2024   K 4.5 08/01/2024   CL 103 08/01/2024   CO2 22 08/01/2024    Studies:  No results found.   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  Incidentally noted to have worsening leukopenia neutropenia and  anemia.   # Worsening leukopenia/neutropenia-anemia etiology is unclear.  August 02, 2024 bone marrow biopsy-no obvious evidence of MDS or leukemia to explain the significant leukopenia/neutropenia.  Suspect acute bone marrow insult-likely viral infection.  Bone marrow also notes to have involvement by prostate cancer-however clinically not suggestive of cause of patient's neutropenia. patient has not significantly improved even on Granix .  Given strong suspicion for immune mediated agranulocytosis- refractory to G-CSF- Im starting pt on IVIG 1gm/kg x 2; tolerated first infusion well.  Proceed with second infusion today.  With prednisone  60 mg/day x1 week- and with out- patient taper; check Parvo;   # Neutropenic fever-on cefepime  plus vancomycin  as per primary team.   # Elevated PTT pending mixing studies.   # Metastatic prostate cancer-castrate sensitive with bone metastases-status post Eligard; Erleda on hold given patient's-significant neutropenia.  Improving.  # Multiple other medical problems including diabetes-labile blood sugars; history of diabetic foot infections/cubitus ulcers etc.   # However plan of care was discussed with hospitalist service, Dr.Weiting.  I also spoke to patient's sister Inocente.  Eric Nees R Sydney Azure, MD

## 2024-08-11 ENCOUNTER — Other Ambulatory Visit: Payer: Self-pay

## 2024-08-11 LAB — HUMAN PARVOVIRUS DNA DETECTION BY PCR: Parvovirus B19, PCR: NEGATIVE

## 2024-08-11 LAB — BASIC METABOLIC PANEL WITH GFR
Anion gap: 12 (ref 5–15)
BUN: 25 mg/dL — ABNORMAL HIGH (ref 8–23)
CO2: 18 mmol/L — ABNORMAL LOW (ref 22–32)
Calcium: 8.6 mg/dL — ABNORMAL LOW (ref 8.9–10.3)
Chloride: 101 mmol/L (ref 98–111)
Creatinine, Ser: 0.89 mg/dL (ref 0.61–1.24)
GFR, Estimated: >60 mL/min (ref >60)
Glucose, Bld: 154 mg/dL — ABNORMAL HIGH (ref 70–99)
Potassium: 4.1 mmol/L (ref 3.5–5.1)
Sodium: 131 mmol/L — ABNORMAL LOW (ref 135–145)

## 2024-08-11 LAB — CBC WITH DIFFERENTIAL/PLATELET
Abs Immature Granulocytes: 0.01 K/uL (ref 0.00–0.07)
Basophils Absolute: 0 K/uL (ref 0.0–0.1)
Basophils Relative: 1 %
Eosinophils Absolute: 0 K/uL (ref 0.0–0.5)
Eosinophils Relative: 0 %
HCT: 23.3 % — ABNORMAL LOW (ref 39.0–52.0)
Hemoglobin: 7.8 g/dL — ABNORMAL LOW (ref 13.0–17.0)
Immature Granulocytes: 1 %
Lymphocytes Relative: 21 %
Lymphs Abs: 0.3 K/uL — ABNORMAL LOW (ref 0.7–4.0)
MCH: 29.3 pg (ref 26.0–34.0)
MCHC: 33.5 g/dL (ref 30.0–36.0)
MCV: 87.6 fL (ref 80.0–100.0)
Monocytes Absolute: 0.9 K/uL (ref 0.1–1.0)
Monocytes Relative: 70 %
Neutro Abs: 0.1 K/uL — CL (ref 1.7–7.7)
Neutrophils Relative %: 7 %
Platelets: 394 K/uL (ref 150–400)
RBC: 2.66 MIL/uL — ABNORMAL LOW (ref 4.22–5.81)
RDW: 18.7 % — ABNORMAL HIGH (ref 11.5–15.5)
Smear Review: NORMAL
WBC: 1.3 K/uL — CL (ref 4.0–10.5)
nRBC: 2.3 % — ABNORMAL HIGH (ref 0.0–0.2)

## 2024-08-11 LAB — GLUCOSE, CAPILLARY
Glucose-Capillary: 160 mg/dL — ABNORMAL HIGH (ref 70–99)
Glucose-Capillary: 163 mg/dL — ABNORMAL HIGH (ref 70–99)
Glucose-Capillary: 264 mg/dL — ABNORMAL HIGH (ref 70–99)
Glucose-Capillary: 262 mg/dL — ABNORMAL HIGH (ref 70–99)
Glucose-Capillary: 199 mg/dL — ABNORMAL HIGH (ref 70–99)

## 2024-08-11 NOTE — Plan of Care (Signed)
  Problem: Education: Goal: Knowledge of General Education information will improve Description: Including pain rating scale, medication(s)/side effects and non-pharmacologic comfort measures Outcome: Progressing   Problem: Health Behavior/Discharge Planning: Goal: Ability to manage health-related needs will improve Outcome: Progressing   Problem: Activity: Goal: Risk for activity intolerance will decrease Outcome: Progressing   Problem: Nutrition: Goal: Adequate nutrition will be maintained Outcome: Progressing   Problem: Coping: Goal: Level of anxiety will decrease Outcome: Progressing   Problem: Elimination: Goal: Will not experience complications related to bowel motility Outcome: Progressing   Problem: Pain Managment: Goal: General experience of comfort will improve and/or be controlled Outcome: Progressing   Problem: Safety: Goal: Ability to remain free from injury will improve Outcome: Progressing

## 2024-08-11 NOTE — Progress Notes (Signed)
 Mobility Specialist - Progress Note    08/11/24 1026  Mobility  Activity Stood at bedside;Ambulated with assistance  Level of Assistance +2 (takes two people)  Location Manager Ambulated (ft) 16 ft  Range of Motion/Exercises Active  RLE Weight Bearing Per Provider Order WBAT  LLE Weight Bearing Per Provider Order WBAT  Activity Response Tolerated well  Mobility Referral Yes  Mobility visit 1 Mobility   Pt resting in recliner on RA upon entry. Pt STS and and ambulates to corner of desk from hallway +2 heavy with RW and chair follow. Pt took x4 seated rest breaks during session. Pt required a little bit of encouragement throughout session feeling like he is not progressing well despite making strides to walk. Pt returned to recliner and left with needs in reach. Chair alarm activated.   Guido Rumble Mobility Specialist 08/11/2024, 10:36 AM

## 2024-08-11 NOTE — TOC Progression Note (Signed)
 Transition of Care [facility]) - Progression Note    Patient Details  Name: [name] MRN: [id number] Date of Birth: [birth year]  Transition of [facility]) CM/SW Contact  Dalia GORMAN Fuse, RN Phone Number: 08/11/2024, 8:45 AM  Clinical Narrative:     WBC 1.33, now on PO abx. Neutrophils 7. Plan for [address] in 2 to 4 days. Likely Monday due to weekend.                    Expected Discharge Plan and Services                                               Social Drivers of Health (SDOH) Interventions SDOH Screenings   Food Insecurity: No Food Insecurity (07/29/2024)  Housing: High Risk (07/29/2024)  Transportation Needs: No Transportation Needs (07/29/2024)  Utilities: Not At Risk (07/29/2024)  Depression (PHQ2-9): Low Risk (07/07/2024)  Financial Resource Strain: Low Risk  (08/28/2023)   Received from [facility]  Recent Concern: Financial Resource Strain - Medium Risk (08/06/2023)   Received from [facility]  Physical Activity: Inactive (08/28/2023)   Received [facility]  Social Connections: Moderately Integrated (07/29/2024)  Stress: No Stress Concern Present (08/28/2023)   Received from [facility]  Tobacco Use: Medium Risk (07/24/2024)  Health Literacy: Adequate Health Literacy (08/28/2023)   Received from [facility]    Readmission Risk Interventions     No data to display

## 2024-08-11 NOTE — Progress Notes (Signed)
 PROGRESS NOTE    [name]   FMW:[id number] DOB: 28-Feb-1951  DOA: 07/24/2024 Date of Service: 08/11/2024 which is hospital day 17  PCP: Adina Buel HERO, MD    Hospital course / significant events:   HPI: 73 y.o. male with medical history significant of metastatic prostate cancer, type 2 diabetes mellitus, arthritis, hypertension and hyperlipidemia.  He presents to the hospital with low sugar.  He states his sister found him on the floor.  His sugar was 36.  He states that he has been taking around 20 units of Lantus  and supposed to take 10.  He has also been taking 12 units of Humalog and was supposed to take 4.  He states he cannot see very well.  He draws up his own insulin  and injects his own insulin .  11/23: In the ER his sugar was in the 50s and 60s.  Hospitalist services contacted for further evaluation.  Patient also states he has an ulcer of his right foot that has been draining a little bit.  No fever chills or sweats.  He finished up radiation treatment for metastatic prostate cancer.  Follows with Dr. Ashley for his foot.  Patient put on D5W and IV antibiotics. 11/24.  Patient taken off D5W drip and will watch sugars another day.  Concerned about his vision and drawing up insulin .  Wondering if he can do pills instead.  MRI right foot did not show osteomyelitis.  Podiatry saw patient and recommended outpatient follow-up. 11/25.  Physical therapy recommending rehab from yesterday.  Case discussed with diabetes coordinator and will do Tradjenta  and Glucophage  and get rid of insulin .  11/25  Patient spiked a fever of 100.7.  Treating as neutropenic fever.  Blood cultures, urine culture and chest x-ray ordered.  Maxipime  ordered.  Continue doxycycline . 11/26: Maximum temperature recorded of 102.7 over the past 24-hour, worsening leukopenia at 0.9-ordered another dose of Granix .  Preliminary blood cultures negative, checking respiratory viral panel, negative COVID, influenza and  RSV.  Continuing cefepime  for now. 11/27: Afebrile over the past 24-hour, WBC at 1.0 and neutrophils of 0.  Discussed with Dr. Jacobo from oncology, advised to continue Granix  until neutrophil of 1000.  Another dose of 480 mg ordered today.  11/28: Remained afebrile.  WBC still at 1 with neutrophil of 0, continuing Granix , phosphorus improved to 2.4-giving another dose. Foot wound does not look infected. 11/29: Remained afebrile, WBC still at 0.9 with neutrophils of 0, case was discussed again with Dr. Jacobo who advised to discontinue antibiotics and continue with Granix , continue holding Erleada . 11/30: Remained afebrile and hemodynamically stable, slowly worsening leukopenia now WBC at 0.8, neutrophil remains 0.  Case was discussed again with Dr. Jacobo - continue with Granix  and he will discuss with his primary oncologist Dr. Rennie tomorrow as he might need bone marrow biopsy. 12/01: Remained afebrile but WBC still at 0.8 with 0 neutrophil.  Bone marrow biopsy scheduled for tomorrow. 12/02: S/p bone marrow biopsy today.  Insurance authorization obtained for SNF, message sent to Dr. Rennie when he thinks he will be safe to move as neutrophil remains 0. 12/05.  Blood pressure on the lower side today will start midodrine .  White blood cell count still low at 0.9 and neutrophil count remains at 0. 12/06.  White blood cell count still low at 0.8 and neutrophil count remains at 0.   12/07.  Patient spiked a fever in the afternoon and started on Maxipime .  Blood cultures, urine and stool ordered.  12/08.  Patient still with low white count of 1 and neutrophil count remains at 0.  Continue empiric antibiotic. Per onc - bone marrow biopsy-no obvious evidence of MDS or leukemia to explain the significant leukopenia/neutropenia. Suspect acute bone marrow insult-likely viral infection. Bone marrow also notes to have involvement by prostate cancer-however clinically not suggestive of cause of  patient's neutropenia.  12/09.  Afebrile. Patient received first dose of IVIG and steroids yesterday.  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 IV antibiotics today and switch to po amoxicillin  tomorrow. 12/10: afebrile. WBC 0.8, Neut 0.  12/11: afebrile. WBC 1.3, Neut 0.1. ANC Per oncology would not dc until abs neut ct >500    Consultants:  Podiatry Oncology   Procedures/Surgeries: 08/02/24 bone marrow biopsy      ASSESSMENT & PLAN:   Neutropenic fever Patient has been on empiric Maxipime  and vancomycin .   No further fever few days.   Blood cultures negative for 2 days, urine negative.   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  --Suspect acute bone marrow insult-likely viral infection  --patient has not significantly improved even on Granix .  S/p 2 doses IVIG When white count comes up patient will be stable for discharge --> wait for ANC at least of 500 prior to dc Oncology following   prednisone  60 mg/day to continue on discharge --> outpatient taper check Parvo   Metastatic prostate cancer   --Bone marrow also notes to have involvement by prostate cancer-however clinically not suggestive of cause of patient's neutropenia  status post Eligard; Erleda on hold given patient's-significant neutropenia.  Oncology following     Hypoglycemia On admission.   secondary to taking too much insulin  and poor vision on drawing up his insulin .   Tradjenta  and Glucophage  maintenance meds  will not need insulin  upon discharge.   sliding scale secondary to prednisone .   Diabetic foot infection  Osteomyelitis ruled OUT Appreciate podiatry consultation.   oral amoxicillin   Podiatry follow up outpatient. Wound care    Decubitus ulcer of left heel,  unstageable heel protector. Podiatry follow up outpatient. Wound care    Hypotension Continue low-dose midodrine .   Hold antihypertensives    Hyperlipidemia, unspecified Continue Lipitor   Low serum vitamin B12 IM B12 followed by oral B12   Pressure injury - present on admission. Wound 07/25/24 Pressure Injury Heel Left Unstageable - Full thickness tissue loss in which the base of the injury is covered by slough (yellow, tan, gray, green or brown) and/or eschar (tan, brown or black) in the wound bed. (Active)  Bilateral buttocks stage II with red moist macerated skin and patchy areas of partial-thickness loss consistent with moisture associated skin damage. Wound care      Class 3 obesity based on BMI: Body mass index is 40.31 kg/m.SABRA Significantly low or high BMI is associated with higher medical risk.  Underweight - under 18  overweight - 25 to 29 obese - 30 or more Class 1 obesity: BMI of 30.0 to 34 Class 2 obesity: BMI of 35.0 to 39 Class 3 obesity: BMI of 40.0 to 49 Super Morbid Obesity: BMI 50-59 Super-super Morbid Obesity: BMI 60+ Healthy nutrition and physical activity advised as adjunct to other disease management and risk reduction treatments    DVT prophylaxis: lovenox  IV fluids: no continuous IV fluids  Nutrition: cardiac/carb diet  Central lines / other devices: none  Code Status: FULL CODE ACP documentation reviewed: has goals of care documentation but no formal legal docs / orders on file in [facility]  [facility] needs: SNF placement - [facility] to confirm when medically stable Medical barriers to dispo: leukopenia / neutropenia. Expected medical readiness for discharge next few dqys if continues to trend appropriately.              Subjective / Brief ROS:  Patient reports no complaints today  Denies CP/SOB.  Pain controlled.  Denies new weakness.  Tolerating diet.  Reports no concerns w/ urination/defecation.   Family Communication: sisters at  bedside     Objective Findings:  Vitals:   08/10/24 2038 08/11/24 0519 08/11/24 0715 08/11/24 1605  BP: 112/61 111/61 (!) 88/60 106/66  Pulse: 97 84 79 (!) 101  Resp: 20 17 16 17   Temp:  (!) 97.5 F (36.4 C) 97.6 F (36.4 C) (!) 97.5 F (36.4 C)  TempSrc:  Oral Oral   SpO2: 97% 96% 100% 100%  Weight:      Height:        Intake/Output Summary (Last 24 hours) at 08/11/2024 1830 Last data filed at 08/11/2024 1439 Gross per 24 hour  Intake 720 ml  Output 700 ml  Net 20 ml   Filed Weights   07/24/24 1550  Weight: 131.1 kg    Examination:  Physical Exam Constitutional:      General: He is not in acute distress. Cardiovascular:     Rate and Rhythm: Normal rate and regular rhythm.  Pulmonary:     Effort: Pulmonary effort is normal.     Breath sounds: Normal breath sounds.  Musculoskeletal:     Right lower leg: No edema.     Left lower leg: No edema.  Skin:    General: Skin is warm and dry.  Neurological:     Mental Status: He is alert. Mental status is at baseline.  Psychiatric:        Mood and Affect: Mood normal.        Behavior: Behavior normal.          Scheduled Medications:   amoxicillin   500 mg Oral Q8H   aspirin  EC  81 mg Oral Daily   atorvastatin   10 mg Oral QPM   collagenase    Topical Daily   vitamin B-12  1,000 mcg Oral Daily   enoxaparin  (LOVENOX ) injection  65 mg Subcutaneous Q24H   filgrastim  (NIVESTYM ) SQ  480 mcg Subcutaneous Daily   Gerhardt's butt cream   Topical BID   insulin  aspart  0-5 Units Subcutaneous QHS   insulin  aspart  0-6 Units Subcutaneous TID WC   linagliptin   5 mg Oral Daily   LORazepam   1 mg Oral Q8H   metFORMIN   1,000 mg Oral BID WC   midodrine   5 mg Oral TID WC   predniSONE   60 mg Oral Q breakfast    Continuous Infusions:  sodium PHOSPHATE  IVPB (in mmol) Stopped (08/10/24 0757)    PRN Medications:  acetaminophen  **OR** acetaminophen , loperamide , ondansetron  **OR** ondansetron  (ZOFRAN ) IV  Antimicrobials  from admission:  Anti-infectives (From admission, onward)    Start     Dose/Rate Route Frequency Ordered Stop   08/10/24 0600  amoxicillin  (AMOXIL ) capsule 500 mg        500 mg Oral Every 8 hours 08/09/24 1102     08/09/24 0400  vancomycin  (VANCOREADY) IVPB 1250 mg/250 mL  1,250 mg 166.7 mL/hr over 90 Minutes Intravenous Every 12 hours 08/08/24 1159 08/09/24 1734   08/08/24 1245  vancomycin  (VANCOREADY) IVPB 2000 mg/400 mL       Placed in Followed by Linked Group   2,000 mg 200 mL/hr over 120 Minutes Intravenous  Once 08/08/24 1152 08/08/24 1534   08/08/24 1245  vancomycin  (VANCOREADY) IVPB 500 mg/100 mL       Placed in Followed by Linked Group   500 mg 100 mL/hr over 60 Minutes Intravenous  Once 08/08/24 1152 08/08/24 1715   08/07/24 1100  ceFEPIme  (MAXIPIME ) 2 g in sodium chloride  0.9 % 100 mL IVPB        2 g 200 mL/hr over 30 Minutes Intravenous Every 8 hours 08/07/24 0948 08/09/24 2026   07/26/24 2200  doxycycline  (VIBRA -TABS) tablet 100 mg  Status:  Discontinued        100 mg Oral Every 12 hours 07/26/24 1542 07/27/24 1628   07/26/24 1630  ceFEPIme  (MAXIPIME ) 2 g in sodium chloride  0.9 % 100 mL IVPB  Status:  Discontinued        2 g 200 mL/hr over 30 Minutes Intravenous Every 8 hours 07/26/24 1541 07/30/24 1434   07/26/24 1000  doxycycline  (VIBRA -TABS) tablet 100 mg  Status:  Discontinued        100 mg Oral Every 12 hours 07/26/24 0704 07/26/24 1541   07/25/24 0800  vancomycin  (VANCOCIN ) IVPB 1000 mg/200 mL premix  Status:  Discontinued        1,000 mg 200 mL/hr over 60 Minutes Intravenous Every 12 hours 07/24/24 1812 07/26/24 0704   07/24/24 2015  vancomycin  (VANCOCIN ) IVPB 1000 mg/200 mL premix       Placed in Followed by Linked Group   1,000 mg 200 mL/hr over 60 Minutes Intravenous  Once 07/24/24 1807 07/25/24 0045   07/24/24 1815  cefTRIAXone  (ROCEPHIN ) 2 g in sodium chloride  0.9 % 100 mL IVPB  Status:  Discontinued        2 g 200 mL/hr over 30 Minutes  Intravenous Every 24 hours 07/24/24 1804 07/26/24 0704   07/24/24 1815  vancomycin  (VANCOREADY) IVPB 1500 mg/300 mL       Placed in Followed by Linked Group   1,500 mg 150 mL/hr over 120 Minutes Intravenous  Once 07/24/24 1807 07/24/24 2249           Data Reviewed:  I have personally reviewed the following...  CBC: Recent Labs  Lab 08/06/24 0602 08/07/24 0504 08/08/24 0539 08/09/24 0531 08/10/24 0447 08/11/24 0352  WBC 0.8* 1.1* 1.0* 0.5* 0.8* 1.3*  NEUTROABS 0.0*  --  0.0* 0.0* 0.0* 0.1*  HGB 8.2* 8.4* 8.2* 7.7* 7.6* 7.8*  HCT 25.0* 25.7* 24.5* 22.8* 21.8* 23.3*  MCV 87.1 86.5 85.1 87.7 88.6 87.6  PLT 380 408* 376 342 366 394   Basic Metabolic Panel: Recent Labs  Lab 08/07/24 0504 08/09/24 0534 08/11/24 0352  NA  --   --  131*  K  --   --  4.1  CL  --   --  101  CO2  --   --  18*  GLUCOSE  --   --  154*  BUN  --   --  25*  CREATININE 0.83 0.76 0.89  CALCIUM   --   --  8.6*   GFR: Estimated Creatinine Clearance: 100.5 mL/min (by C-G formula based on SCr of 0.89 mg/dL). Liver Function Tests: No results for input(s): AST, ALT, ALKPHOS, BILITOT, PROT, ALBUMIN in the  last 168 hours. No results for input(s): LIPASE, AMYLASE in the last 168 hours. No results for input(s): AMMONIA in the last 168 hours. Coagulation Profile: Recent Labs  Lab 08/06/24 0602  INR 1.4*   Cardiac Enzymes: No results for input(s): CKTOTAL, CKMB, CKMBINDEX, TROPONINI in the last 168 hours. BNP (last 3 results) No results for input(s): PROBNP in the last 8760 hours. HbA1C: No results for input(s): HGBA1C in the last 72 hours. CBG: Recent Labs  Lab 08/10/24 2040 08/11/24 0517 08/11/24 0719 08/11/24 1202 08/11/24 1757  GLUCAP 285* 160* 163* 264* 262*   Lipid Profile: No results for input(s): CHOL, HDL, LDLCALC, TRIG, CHOLHDL, LDLDIRECT in the last 72 hours. Thyroid Function Tests: No results for input(s): TSH, T4TOTAL,  FREET4, T3FREE, THYROIDAB in the last 72 hours. Anemia Panel: No results for input(s): VITAMINB12, FOLATE, FERRITIN, TIBC, IRON , RETICCTPCT in the last 72 hours. Most Recent Urinalysis On File:     Component Value Date/Time   COLORURINE YELLOW (A) 08/08/2024 0930   APPEARANCEUR CLEAR (A) 08/08/2024 0930   LABSPEC 1.018 08/08/2024 0930   PHURINE 5.0 08/08/2024 0930   GLUCOSEU NEGATIVE 08/08/2024 0930   HGBUR NEGATIVE 08/08/2024 0930   BILIRUBINUR NEGATIVE 08/08/2024 0930   KETONESUR 5 (A) 08/08/2024 0930   PROTEINUR NEGATIVE 08/08/2024 0930   NITRITE NEGATIVE 08/08/2024 0930   LEUKOCYTESUR NEGATIVE 08/08/2024 0930   Sepsis Labs: @LABRCNTIP (procalcitonin:4,lacticidven:4) Microbiology: Recent Results (from the past 240 hours)  Culture, blood (Routine X 2) w Reflex to ID Panel     Status: None (Preliminary result)   Collection Time: 08/07/24  9:49 AM   Specimen: BLOOD  Result Value Ref Range Status   Specimen Description BLOOD BLOOD RIGHT ARM  Final   Special Requests   Final    BOTTLES DRAWN AEROBIC AND ANAEROBIC Blood Culture adequate volume   Culture   Final    NO GROWTH 4 DAYS Performed at [facility], [address], [place], [place] [address]    Report Status PENDING  Incomplete  Culture, blood (Routine X 2) w Reflex to ID Panel     Status: None (Preliminary result)   Collection Time: 08/07/24  9:58 AM   Specimen: BLOOD  Result Value Ref Range Status   Specimen Description BLOOD BLOOD LEFT HAND  Final   Special Requests   Final    BOTTLES DRAWN AEROBIC AND ANAEROBIC Blood Culture adequate volume   Culture   Final    NO GROWTH 4 DAYS Performed at [facility], [address], [place], [place] [address]    Report Status PENDING  Incomplete      Radiology Studies last 3 days: No results found.        Jakala Herford, DO [facility] 08/11/2024, 6:30 PM    Dictation software may have been used to generate the  above note. Typos may occur and escape review in typed/dictated notes. Please contact Dr Marsa directly for clarity if needed.  Staff may message me via secure chat in Epic  but this may not receive an immediate response,  please page me for urgent matters!  If 7PM-7AM, please contact night coverage [email or web address]

## 2024-08-11 NOTE — Plan of Care (Signed)

## 2024-08-12 ENCOUNTER — Other Ambulatory Visit: Payer: Self-pay | Admitting: Internal Medicine

## 2024-08-12 DIAGNOSIS — R5081 Fever presenting with conditions classified elsewhere: Secondary | ICD-10-CM | POA: Diagnosis not present

## 2024-08-12 DIAGNOSIS — D702 Other drug-induced agranulocytosis: Secondary | ICD-10-CM | POA: Insufficient documentation

## 2024-08-12 DIAGNOSIS — D709 Neutropenia, unspecified: Secondary | ICD-10-CM | POA: Diagnosis not present

## 2024-08-12 LAB — CBC WITH DIFFERENTIAL/PLATELET
Abs Immature Granulocytes: 0.13 K/uL — ABNORMAL HIGH (ref 0.00–0.07)
Basophils Absolute: 0 K/uL (ref 0.0–0.1)
Basophils Relative: 2 %
Eosinophils Absolute: 0 K/uL (ref 0.0–0.5)
Eosinophils Relative: 0 %
HCT: 24.8 % — ABNORMAL LOW (ref 39.0–52.0)
Hemoglobin: 8.2 g/dL — ABNORMAL LOW (ref 13.0–17.0)
Immature Granulocytes: 5 %
Lymphocytes Relative: 15 %
Lymphs Abs: 0.4 K/uL — ABNORMAL LOW (ref 0.7–4.0)
MCH: 28.6 pg (ref 26.0–34.0)
MCHC: 33.1 g/dL (ref 30.0–36.0)
MCV: 86.4 fL (ref 80.0–100.0)
Monocytes Absolute: 1.3 K/uL — ABNORMAL HIGH (ref 0.1–1.0)
Monocytes Relative: 48 %
Neutro Abs: 0.8 K/uL — ABNORMAL LOW (ref 1.7–7.7)
Neutrophils Relative %: 30 %
Platelets: 443 K/uL — ABNORMAL HIGH (ref 150–400)
RBC: 2.87 MIL/uL — ABNORMAL LOW (ref 4.22–5.81)
RDW: 18.6 % — ABNORMAL HIGH (ref 11.5–15.5)
Smear Review: NORMAL
WBC: 2.6 K/uL — ABNORMAL LOW (ref 4.0–10.5)
nRBC: 5.7 % — ABNORMAL HIGH (ref 0.0–0.2)

## 2024-08-12 LAB — CULTURE, BLOOD (ROUTINE X 2)
Culture: NO GROWTH
Culture: NO GROWTH
Special Requests: ADEQUATE
Special Requests: ADEQUATE

## 2024-08-12 LAB — GLUCOSE, CAPILLARY
Glucose-Capillary: 161 mg/dL — ABNORMAL HIGH (ref 70–99)
Glucose-Capillary: 158 mg/dL — ABNORMAL HIGH (ref 70–99)
Glucose-Capillary: 218 mg/dL — ABNORMAL HIGH (ref 70–99)

## 2024-08-12 MED ORDER — MIDODRINE HCL 5 MG PO TABS: 5.0000 mg | ORAL_TABLET | Freq: Three times a day (TID) | ORAL | Status: DC

## 2024-08-12 MED ORDER — AMOXICILLIN 500 MG PO CAPS: 500.0000 mg | ORAL_CAPSULE | Freq: Three times a day (TID) | ORAL | Status: AC

## 2024-08-12 MED ORDER — LINAGLIPTIN 5 MG PO TABS: 5.0000 mg | ORAL_TABLET | Freq: Every day | ORAL | Status: DC

## 2024-08-12 MED ORDER — PREDNISONE 20 MG PO TABS: 60.0000 mg | ORAL_TABLET | Freq: Every day | ORAL | Status: DC

## 2024-08-12 MED ORDER — CYANOCOBALAMIN 1000 MCG PO TABS: 1000.0000 ug | ORAL_TABLET | Freq: Every day | ORAL | Status: AC

## 2024-08-12 MED ORDER — LORAZEPAM 1 MG PO TABS: 1.0000 mg | ORAL_TABLET | Freq: Three times a day (TID) | ORAL | 0 refills | Status: DC

## 2024-08-12 MED ORDER — COLLAGENASE 250 UNIT/GM EX OINT: TOPICAL_OINTMENT | Freq: Every day | CUTANEOUS | Status: AC

## 2024-08-12 MED ORDER — FILGRASTIM-AAFI 480 MCG/0.8ML IJ SOSY: 480.0000 ug | PREFILLED_SYRINGE | Freq: Every day | INTRAMUSCULAR | Status: DC

## 2024-08-12 MED ORDER — ACETAMINOPHEN 325 MG PO TABS: 650.0000 mg | ORAL_TABLET | Freq: Four times a day (QID) | ORAL | Status: AC | PRN

## 2024-08-12 MED ORDER — ONDANSETRON HCL 4 MG PO TABS: 4.0000 mg | ORAL_TABLET | Freq: Four times a day (QID) | ORAL | Status: AC | PRN

## 2024-08-12 MED ORDER — LOPERAMIDE HCL 2 MG PO CAPS: 4.0000 mg | ORAL_CAPSULE | Freq: Three times a day (TID) | ORAL | Status: AC | PRN

## 2024-08-12 MED ORDER — GERHARDT'S BUTT CREAM: 1.0000 | TOPICAL_CREAM | Freq: Two times a day (BID) | CUTANEOUS | Status: DC

## 2024-08-12 NOTE — Inpatient Diabetes Management (Signed)
 Inpatient Diabetes Program Recommendations  AACE/ADA: New Consensus Statement on Inpatient Glycemic Control   Target Ranges:  Prepandial:   less than 140 mg/dL      Peak postprandial:   less than 180 mg/dL (1-2 hours)      Critically ill patients:  140 - 180 mg/dL    Latest Reference Range & Units 08/11/24 07:19 08/11/24 12:02 08/11/24 17:57 08/11/24 21:39 08/12/24 04:54 08/12/24 07:33  Glucose-Capillary 70 - 99 mg/dL 836 (H) 735 (H) 737 (H) 199 (H) 161 (H) 158 (H)   Review of Glycemic Control  Diabetes history: DM2 Outpatient Diabetes medications: Lantus  20 units daily, Humalog 12 units TID, Metformin  1000 mg BID Current orders for Inpatient glycemic control: Novolog  0-6 units TID with meals, Novolog  0-5 units at bedtime, Metformin  1000 mg BID, Tradjenta  5 mg daily; Prednisone  60 mg QAM  Inpatient Diabetes Program Recommendations:    Insulin : If steroids are continued as ordered, please consider ordering Novolog  2 units TID with meals for meal coverage if patient eats at least 50% of meals.  Thanks, Earnie Gainer, RN, MSN, CDCES Diabetes Coordinator Inpatient Diabetes Program [phone] (Team Pager from 8am to 5pm)

## 2024-08-12 NOTE — Plan of Care (Signed)
°  Problem: Clinical Measurements: Goal: Ability to maintain clinical measurements within normal limits will improve Outcome: Progressing   Problem: Clinical Measurements: Goal: Respiratory complications will improve Outcome: Progressing   Problem: Coping: Goal: Level of anxiety will decrease Outcome: Progressing   Problem: Elimination: Goal: Will not experience complications related to bowel motility Outcome: Progressing   Problem: Safety: Goal: Ability to remain free from injury will improve Outcome: Progressing   Problem: Skin Integrity: Goal: Risk for impaired skin integrity will decrease Outcome: Progressing   Problem: Pain Managment: Goal: General experience of comfort will improve and/or be controlled Outcome: Progressing

## 2024-08-12 NOTE — Progress Notes (Signed)
 Tanvir Hipple Henneke   DOB:April 18, 1951   FM#:[id number]    Subjective:   Appetite fair.  No nausea or vomiting.  Patient status post IVIG when infusion x2; and currently on prednisone .  Post IVIG no complications noted.  No further fevers noted.  Patient sitting up in the chair having his lunch.  Objective:  Vitals:   08/12/24 0522 08/12/24 0728  BP: (!) 103/56 110/65  Pulse: 87 87  Resp: 20 16  Temp:  98.1 F (36.7 C)  SpO2: 100% 100%     Intake/Output Summary (Last 24 hours) at 08/12/2024 1313 Last data filed at 08/12/2024 0900 Gross per 24 hour  Intake 770 ml  Output 800 ml  Net -30 ml    Physical Exam Vitals and nursing note reviewed.  HENT:     Head: Normocephalic and atraumatic.     Mouth/Throat:     Pharynx: Oropharynx is clear.  Eyes:     Extraocular Movements: Extraocular movements intact.     Pupils: Pupils are equal, round, and reactive to light.  Cardiovascular:     Rate and Rhythm: Normal rate and regular rhythm.  Pulmonary:     Comments: Decreased breath sounds bilaterally.  Abdominal:     Palpations: Abdomen is soft.  Musculoskeletal:        General: Normal range of motion.     Cervical back: Normal range of motion.  Skin:    General: Skin is warm.  Neurological:     General: No focal deficit present.     Mental Status: He is alert and oriented to person, place, and time.  Psychiatric:        Behavior: Behavior normal.        Judgment: Judgment normal.      Labs:  Lab Results  Component Value Date   WBC 2.6 (L) 08/12/2024   HGB 8.2 (L) 08/12/2024   HCT 24.8 (L) 08/12/2024   MCV 86.4 08/12/2024   PLT 443 (H) 08/12/2024   NEUTROABS 0.8 (L) 08/12/2024    Lab Results  Component Value Date   NA 131 (L) 08/11/2024   K 4.1 08/11/2024   CL 101 08/11/2024   CO2 18 (L) 08/11/2024    Studies:  No results found.   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  Incidentally noted to have worsening leukopenia neutropenia and anemia.   # Worsening leukopenia/neutropenia-anemia etiology is unclear-however clinically suspect medication induced. ? Erleda.  Will plan to discontinue Erleada .  August 02, 2024 bone marrow biopsy-no obvious evidence of MDS or leukemia to explain the significant leukopenia/neutropenia.  Suspect acute bone marrow insult-likely viral infection.  Bone marrow also notes to have involvement by prostate cancer-however clinically not suggestive of cause of patient's neutropenia.  Parvovirus workup negative.  # Patient's status post IVIG 1 g/kg x 2 infusions; and also currently on prednisone  60 mg a day-noted to have improvement of ANC of 0.8.  Patient also on G-CSF-if available at the facility would recommend continue G-CSF-until ANC is 1.5.  # Neutropenic fever-resolved.   # Elevated PTT- repeat- WNL.    # Metastatic prostate cancer-castrate sensitive with bone metastases-status post Eligard; DISCONTINUE Erleda on hold given patient's-significant neutropenia.  Improving.  # Multiple other medical problems including diabetes-labile blood sugars; history of diabetic foot infections/cubitus ulcers etc.   # However plan of care was discussed with hospitalist service, Dr.Alexande; and also patient sister, Inocente.  Will plan out Pt next wek in clinic.   Mayleigh Tetrault R Tahja Liao, MD

## 2024-08-12 NOTE — TOC Transition Note (Addendum)
 Transition of Care [facility]) - Discharge Note   Patient Details  Name: [name] MRN: [id number] Date of Birth: 31-Dec-1950  [facility] CM/SW Contact:  Dalia GORMAN Fuse, RN Phone Number: 08/12/2024, 11:02 AM   Clinical Narrative:    Patient is medically clear to discharge to [facility] today. The patient and his sister are in agreement with the discharge plan. [facility] to transport.   Nurse to call report to [facility] [phone]. RM [phone] A   Final next level of care: Skilled Nursing Facility Barriers to Discharge: Barriers Resolved   Patient Goals and CMS Choice     Choice offered to / list presented to : Patient      Discharge Placement              Patient chooses bed at:  [facility]) Patient to be transferred to facility by: [facility] Name of family member notified: Sister, Emergency Contact   [phone] Patient and family notified of of transfer: 08/12/24  Discharge Plan and Services Additional resources added to the After Visit Summary for                                       Social Drivers of Health (SDOH) Interventions SDOH Screenings   Food Insecurity: No Food Insecurity (07/29/2024)  Housing: High Risk (07/29/2024)  Transportation Needs: No Transportation Needs (07/29/2024)  Utilities: Not At Risk (07/29/2024)  Depression (PHQ2-9): Low Risk (07/07/2024)  Financial Resource Strain: Low Risk  (08/28/2023)   Received from [facility]  Recent Concern: Financial Resource Strain - Medium Risk (08/06/2023)   Received from [facility]  Physical Activity: Inactive (08/28/2023)   Received from [facility]  Social Connections: Moderately Integrated (07/29/2024)  Stress: No Stress Concern Present (08/28/2023)   Received from [facility]  Tobacco Use: Medium Risk (07/24/2024)  Health Literacy: Adequate Health Literacy (08/28/2023)   Received from  [facility]     Readmission Risk Interventions     No data to display

## 2024-08-12 NOTE — TOC Progression Note (Signed)
 Transition of Care [facility]) - Progression Note    Patient Details  Name: [name] MRN: [id number] Date of Birth: 08/22/1951  Transition of Care [facility]) CM/SW Contact  Dalia GORMAN Fuse, RN Phone Number: 08/12/2024, 9:53 AM  Clinical Narrative:     Approved PlanAuthID: [id number] Dates: 12/9-12/07/2024 Next Review Date: 08/11/2024    TOC messaged [facility] and patient can admit with current auth before 11:59 PM tonight.  TOC sent message to Madelin Kohler with [employer] to make her aware.                   Expected Discharge Plan and Services                                               Social Drivers of Health (SDOH) Interventions SDOH Screenings   Food Insecurity: No Food Insecurity (07/29/2024)  Housing: High Risk (07/29/2024)  Transportation Needs: No Transportation Needs (07/29/2024)  Utilities: Not At Risk (07/29/2024)  Depression (PHQ2-9): Low Risk (07/07/2024)  Financial Resource Strain: Low Risk  (08/28/2023)   Received from [facility]  Recent Concern: Financial Resource Strain - Medium Risk (08/06/2023)   Received from [facility]  Physical Activity: Inactive (08/28/2023)   Received from [facility]  Social Connections: Moderately Integrated (07/29/2024)  Stress: No Stress Concern Present (08/28/2023)   Received from [facility]  Tobacco Use: Medium Risk (07/24/2024)  Health Literacy: Adequate Health Literacy (08/28/2023)   Received from [facility]    Readmission Risk Interventions     No data to display

## 2024-08-12 NOTE — Discharge Summary (Signed)
 Physician Discharge Summary   Patient: [name] MRN: [id number]  DOB: 12-12-1950   Admit:     Date of Admission: 07/24/2024 Admitted from: home   Discharge: Date of discharge: 08/12/2024 Disposition: Skilled nursing facility Condition at discharge: fair  CODE STATUS: FULL CODE     Discharge Physician: Laneta Blunt, DO [facility]     PCP: Adina Buel HERO, MD  Recommendations for Outpatient Follow-up:  Follow up with PCP Adina Buel HERO, MD in 1-2 weeks Follow next week w/ Dr Rennie oncology  Follow next week w/ Dr Tanda podiatry     Discharge Instructions     Diet Carb Modified   Complete by: As directed    Discharge instructions   Complete by: As directed    Follow up next week with oncology and podiatry - confirm appts (see dc summary) CBC / differential every 2-3 days and cc results to oncology Dr Rennie  Daily fasting Glc checks and resume insulin  if needed per supervising provider   Discharge wound care:   Complete by: As directed    Wound care  Until discontinued      Comments: 1. Apply Santyl  to right foot wounds and left heel wound Q day, then cover with moist gauze and foam dressing.  Change foam dressing Soila # [id number]) Q 3 days or PRN soiling.  2. Apply Gerhardts butt cream BID and PRN.   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 male with medical history significant of  metastatic prostate cancer, type 2 diabetes mellitus, arthritis, hypertension and hyperlipidemia.  He presents to the hospital with low sugar.  He states his sister found him on the floor.  His sugar was 36.  He states that he has been taking around 20 units of Lantus  and supposed to take 10.  He has also been taking 12 units of Humalog and was supposed to take 4.  He states he cannot see very well.  He draws up his own insulin  and injects his own insulin .  11/23: In the ER his sugar was in the 50s and 60s.  Hospitalist services contacted for further evaluation.  Patient also states he has an ulcer of his right foot that has been draining a little bit.  No fever chills or sweats.  He finished up radiation treatment for metastatic prostate cancer.  Follows with Dr. Ashley for his foot.  Patient put on D5W and IV antibiotics. 11/24.  Patient taken off D5W drip and will watch sugars another day.  Concerned about his vision and drawing up insulin .  Wondering if he can do pills instead.  MRI right foot did not show osteomyelitis.  Podiatry saw patient and recommended outpatient follow-up. 11/25.  Physical therapy recommending rehab from yesterday.  Case discussed with diabetes coordinator and will do Tradjenta  and Glucophage  and get rid of insulin .  11/25  Patient spiked a fever of 100.7.  Treating as neutropenic fever.  Blood cultures, urine culture and chest x-ray ordered.  Maxipime  ordered.  Continue doxycycline . 11/26: Maximum temperature recorded of 102.7 over the past 24-hour, worsening leukopenia at 0.9-ordered another dose of Granix .  Preliminary blood cultures negative, checking respiratory viral panel, negative COVID, influenza and RSV.  Continuing cefepime  for now. 11/27: Afebrile over the past 24-hour, WBC at 1.0 and neutrophils of 0.  Discussed with Dr. Jacobo from oncology, advised to continue Granix  until neutrophil of 1000.  Another dose of 480 mg ordered today.  11/28: Remained afebrile.  WBC  still at 1 with neutrophil of 0, continuing Granix , phosphorus improved to 2.4-giving another dose. Foot wound does not look infected. 11/29: Remained afebrile, WBC still at 0.9 with neutrophils of 0, case was discussed again with Dr. Jacobo who advised to discontinue antibiotics and continue with Granix , continue holding Erleada . 11/30: Remained afebrile and hemodynamically stable, slowly worsening leukopenia now WBC at 0.8, neutrophil remains 0.  Case was discussed again with Dr. Jacobo - continue with Granix  and he will discuss with his primary oncologist Dr. Rennie tomorrow as he might need bone marrow biopsy. 12/01: Remained afebrile but WBC still at 0.8 with 0 neutrophil.  Bone marrow biopsy scheduled for tomorrow. 12/02: S/p bone marrow biopsy today.  Insurance authorization obtained for SNF, message sent to Dr. Rennie when he thinks he will be safe to move as neutrophil remains 0. 12/05.  Blood pressure on the lower side today will start midodrine .  White blood cell count still low at 0.9 and neutrophil count remains at 0. 12/06.  White blood cell count still low at 0.8 and neutrophil count remains at 0.   12/07.  Patient spiked a fever in the afternoon and started on Maxipime .  Blood cultures, urine and stool ordered. 12/08.  Patient still with low white count of 1 and neutrophil count remains at 0.  Continue empiric antibiotic. Per onc - bone marrow biopsy-no obvious evidence of MDS or leukemia to explain the significant leukopenia/neutropenia. Suspect acute bone marrow insult-likely viral infection. Bone marrow also notes to have involvement by prostate cancer-however clinically not suggestive of cause of patient's neutropenia.  12/09.  Afebrile. Patient received first dose of IVIG and steroids yesterday.  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 IV antibiotics today and switch to po amoxicillin  tomorrow. 12/10: afebrile. WBC 0.8K, Neut 0.  12/11: afebrile. WBC  1.3K, Neut 0.1K. Per oncology would not dc until abs neut ct >500 12/12: afebrile. WBC 2.6K, Neut 0.8K. OK for dc    Consultants:  Podiatry Oncology   Procedures/Surgeries: 08/02/24 bone marrow biopsy      ASSESSMENT & PLAN:   Neutropenic fever Patient has been on empiric Maxipime  and vancomycin .   No further fever few days.   Blood cultures negative for 2 days, urine negative.   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  --Suspect acute bone marrow insult-likely viral infection  --patient has not significantly improved even on Granix .  S/p 2 doses IVIG Oncology following - Dr Rennie to arrange outpatient f/u next week  prednisone  60 mg/day to continue on discharge --> outpatient taper Continue filgrastim   Monitor CBC   Metastatic prostate cancer   --Bone marrow also notes to  have involvement by prostate cancer-however clinically not suggestive of cause of patient's neutropenia  status post Eligard; Erleda on hold given patient's-significant neutropenia.  Oncology following     Hypoglycemia On admission.   secondary to taking too much insulin  and poor vision on drawing up his insulin .   Tradjenta  and Glucophage  maintenance meds  will not need insulin  upon discharge.   Monitor Glc fasting daily    Diabetic foot infection  Osteomyelitis ruled OUT Appreciate podiatry consultation.   oral amoxicillin  pending podiatry follow up  Podiatry follow up outpatient. Wound care    Decubitus ulcer of left heel, unstageable heel protector. Podiatry follow up outpatient. Referral to Dr Greig Blush  Wound care    Hypotension Continue low-dose midodrine .   Hold antihypertensives    Hyperlipidemia, unspecified Continue Lipitor    Low serum vitamin B12 IM B12 followed by oral B12   Pressure injury - present on admission. Wound 07/25/24 Pressure Injury Heel Left Unstageable - Full thickness tissue loss in which the base of the injury is covered by slough (yellow, tan, gray, green or brown) and/or eschar (tan, brown or black) in the wound bed. (Active)  Bilateral buttocks stage II with red moist macerated skin and patchy areas of partial-thickness loss consistent with moisture associated skin damage. Wound care      Class 3 obesity based on BMI: Body mass index is 40.31 kg/m.SABRA Significantly low or high BMI is associated with higher medical risk.  Underweight - under 18  overweight - 25 to 29 obese - 30 or more Class 1 obesity: BMI of 30.0 to 34 Class 2 obesity: BMI of 35.0 to 39 Class 3 obesity: BMI of 40.0 to 49 Super Morbid Obesity: BMI 50-59 Super-super Morbid Obesity: BMI 60+ Healthy nutrition and physical activity advised as adjunct to other disease management and risk reduction treatments             Discharge Instructions  Allergies as of 08/12/2024       Reactions   Sulfa Antibiotics Rash   Piperacillin -tazobactam In Dex         Medication List     PAUSE taking these medications    apalutamide  60 MG tablet Wait to take this until your doctor or other care provider tells you to start again. Commonly known as: ERLEADA  Take 4 tablets (240 mg total) by mouth daily.       STOP taking these medications    Accu-Chek Aviva Plus test strip Generic drug: glucose blood   Accu-Chek Guide test strip Generic drug: glucose blood   Accu-Chek Softclix Lancets lancets   Droplet Insulin  Syringe 31G X 5/16 0.3 ML Misc Generic drug: Insulin  Syringe-Needle U-100   insulin  glargine 100 UNIT/ML injection Commonly known as: LANTUS    insulin  lispro 100 UNIT/ML injection Commonly known as: HUMALOG   Insulin  Syringe-Needle U-100 31G X 5/16 0.3 ML Misc   lisinopril  10 MG  tablet Commonly known as: ZESTRIL    VASHE WOUND THERAPY EX       TAKE these medications    acetaminophen  325 MG tablet Commonly known as: TYLENOL  Take 2 tablets (650 mg total) by mouth every 6 (six) hours as needed for mild pain (pain score 1-3) or fever (or Fever >/= 101).   amoxicillin  500 MG capsule Commonly known as: AMOXIL  Take 1 capsule (500 mg total) by mouth every 8 (eight) hours for 5 days. Continue or stop per podiatry instructions   aspirin  EC 81 MG tablet Take 81 mg by mouth  daily. Swallow whole.   atorvastatin  10 MG tablet Commonly known as: LIPITOR Take 10 mg by mouth every evening.   collagenase  250 UNIT/GM ointment Commonly known as: SANTYL  Apply topically daily. To wound / pressure ulcers Start taking on: August 13, 2024   cyanocobalamin  1000 MCG tablet Take 1 tablet (1,000 mcg total) by mouth daily. Start taking on: August 13, 2024   filgrastim -aafi 480 MCG/0.8ML Sosy injection Commonly known as: NIVESTYM  Inject 0.8 mLs (480 mcg total) into the skin daily. Start taking on: August 13, 2024   Gerhardt's butt cream Crea Apply 1 Application topically 2 (two) times daily.   linagliptin  5 MG Tabs tablet Commonly known as: TRADJENTA  Take 1 tablet (5 mg total) by mouth daily. Start taking on: August 13, 2024   loperamide  2 MG capsule Commonly known as: IMODIUM  Take 2 capsules (4 mg total) by mouth every 8 (eight) hours as needed for diarrhea or loose stools.   LORazepam  1 MG tablet Commonly known as: ATIVAN  Take 1 tablet (1 mg total) by mouth every 8 (eight) hours.   metFORMIN  1000 MG tablet Commonly known as: GLUCOPHAGE  Take 1,000 mg by mouth 2 (two) times daily with a meal.   midodrine  5 MG tablet Commonly known as: PROAMATINE  Take 1 tablet (5 mg total) by mouth 3 (three) times daily with meals.   ondansetron  4 MG tablet Commonly known as: ZOFRAN  Take 1 tablet (4 mg total) by mouth every 6 (six) hours as needed for nausea.    predniSONE  20 MG tablet Commonly known as: DELTASONE  Take 3 tablets (60 mg total) by mouth daily with breakfast. Further refill / taper instructions per oncology Start taking on: August 13, 2024               Discharge Care Instructions  (From admission, onward)           Start     Ordered   08/12/24 0000  Discharge wound care:       Comments: Wound care  Until discontinued      Comments: 1. Apply Santyl  to right foot wounds and left heel wound Q day, then cover with moist gauze and foam dressing.  Change foam dressing Soila # [id number]) Q 3 days or PRN soiling.  2. Apply Gerhardts butt cream BID and PRN.   08/12/24 1048             Follow-up Information     Adamo, Buel HERO, MD Follow up.   Specialty: Family Medicine Why: hospital follow up Contact information: [address] [place] [address] [phone]         [name], MD. Go to.   Specialties: Internal Medicine, Oncology Why: cancer center should call to make appt please confirm with them Contact information: [address] [place] [address] [phone]         Tanda Greig MATSU, DPM. Schedule an appointment as soon as possible for a visit.   Specialty: Podiatry Why: hosptial follow up diabetic foot ulcer, venous stasis Contact information: [address] [place] [address] [phone]                 Allergies[1]   Subjective: pt feeling wlel today, no concerns. Tolerating diet. No pain, no CP/SOB   Discharge Exam: BP 110/65   Pulse 87   Temp 98.1 F (36.7 C) (Oral)   Resp 16   Ht 5' 11 (1.803 m)   Wt 131.1 kg   SpO2 100%  BMI 40.31 kg/m  General: Pt is alert, awake, not in acute distress Cardiovascular: RRR, S1/S2 +, no rubs, no gallops Respiratory: CTA bilaterally, no wheezing, no rhonchi Abdominal: Soft, NT, ND, bowel sounds + Extremities: no edema, no cyanosis     The results of significant diagnostics from this  hospitalization (including imaging, microbiology, ancillary and laboratory) are listed below for reference.     Microbiology: Recent Results (from the past 240 hours)  Culture, blood (Routine X 2) w Reflex to ID Panel     Status: None   Collection Time: 08/07/24  9:49 AM   Specimen: BLOOD  Result Value Ref Range Status   Specimen Description BLOOD BLOOD RIGHT ARM  Final   Special Requests   Final    BOTTLES DRAWN AEROBIC AND ANAEROBIC Blood Culture adequate volume   Culture   Final    NO GROWTH 5 DAYS Performed at [facility], [address] [place], [place] [address]    Report Status 08/12/2024 FINAL  Final  Culture, blood (Routine X 2) w Reflex to ID Panel     Status: None   Collection Time: 08/07/24  9:58 AM   Specimen: BLOOD  Result Value Ref Range Status   Specimen Description BLOOD BLOOD LEFT HAND  Final   Special Requests   Final    BOTTLES DRAWN AEROBIC AND ANAEROBIC Blood Culture adequate volume   Culture   Final    NO GROWTH 5 DAYS Performed at [facility], [address] [place], [place] [address]    Report Status 08/12/2024 FINAL  Final     Labs: BNP (last 3 results) No results for input(s): BNP in the last 8760 hours. Basic Metabolic Panel: Recent Labs  Lab 08/07/24 0504 08/09/24 0534 08/11/24 0352  NA  --   --  131*  K  --   --  4.1  CL  --   --  101  CO2  --   --  18*  GLUCOSE  --   --  154*  BUN  --   --  25*  CREATININE 0.83 0.76 0.89  CALCIUM   --   --  8.6*   Liver Function Tests: No results for input(s): AST, ALT, ALKPHOS, BILITOT, PROT, ALBUMIN in the last 168 hours. No results for input(s): LIPASE, AMYLASE in the last 168 hours. No results for input(s): AMMONIA in the last 168 hours. CBC: Recent Labs  Lab 08/08/24 0539 08/09/24 0531 08/10/24 0447 08/11/24 0352 08/12/24 0544  WBC 1.0* 0.5* 0.8* 1.3* 2.6*  NEUTROABS 0.0* 0.0* 0.0* 0.1* 0.8*  HGB 8.2* 7.7* 7.6* 7.8* 8.2*  HCT 24.5* 22.8*  21.8* 23.3* 24.8*  MCV 85.1 87.7 88.6 87.6 86.4  PLT 376 342 366 394 443*   Cardiac Enzymes: No results for input(s): CKTOTAL, CKMB, CKMBINDEX, TROPONINI in the last 168 hours. BNP: Invalid input(s): POCBNP CBG: Recent Labs  Lab 08/11/24 1202 08/11/24 1757 08/11/24 2139 08/12/24 0454 08/12/24 0733  GLUCAP 264* 262* 199* 161* 158*   D-Dimer No results for input(s): DDIMER in the last 72 hours. Hgb A1c No results for input(s): HGBA1C in the last 72 hours. Lipid Profile No results for input(s): CHOL, HDL, LDLCALC, TRIG, CHOLHDL, LDLDIRECT in the last 72 hours. Thyroid function studies No results for input(s): TSH, T4TOTAL, T3FREE, THYROIDAB in the last 72 hours.  Invalid input(s): FREET3 Anemia work up No results for input(s): VITAMINB12, FOLATE, FERRITIN, TIBC, IRON , RETICCTPCT in the last 72 hours. Urinalysis    Component Value  Date/Time   COLORURINE YELLOW (A) 08/08/2024 0930   APPEARANCEUR CLEAR (A) 08/08/2024 0930   LABSPEC 1.018 08/08/2024 0930   PHURINE 5.0 08/08/2024 0930   GLUCOSEU NEGATIVE 08/08/2024 0930   HGBUR NEGATIVE 08/08/2024 0930   BILIRUBINUR NEGATIVE 08/08/2024 0930   KETONESUR 5 (A) 08/08/2024 0930   PROTEINUR NEGATIVE 08/08/2024 0930   NITRITE NEGATIVE 08/08/2024 0930   LEUKOCYTESUR NEGATIVE 08/08/2024 0930   Sepsis Labs Recent Labs  Lab 08/09/24 0531 08/10/24 0447 08/11/24 0352 08/12/24 0544  WBC 0.5* 0.8* 1.3* 2.6*   Microbiology Recent Results (from the past 240 hours)  Culture, blood (Routine X 2) w Reflex to ID Panel     Status: None   Collection Time: 08/07/24  9:49 AM   Specimen: BLOOD  Result Value Ref Range Status   Specimen Description BLOOD BLOOD RIGHT ARM  Final   Special Requests   Final    BOTTLES DRAWN AEROBIC AND ANAEROBIC Blood Culture adequate volume   Culture   Final    NO GROWTH 5 DAYS Performed at [facility], [address] [place], [place] [address]     Report Status 08/12/2024 FINAL  Final  Culture, blood (Routine X 2) w Reflex to ID Panel     Status: None   Collection Time: 08/07/24  9:58 AM   Specimen: BLOOD  Result Value Ref Range Status   Specimen Description BLOOD BLOOD LEFT HAND  Final   Special Requests   Final    BOTTLES DRAWN AEROBIC AND ANAEROBIC Blood Culture adequate volume   Culture   Final    NO GROWTH 5 DAYS Performed at [facility], [address] [place], [place] [address]    Report Status 08/12/2024 FINAL  Final   Imaging MR FOOT RIGHT W WO CONTRAST Result Date: 07/24/2024 CLINICAL DATA:  Right foot ulcer.  History of prior amputations. EXAM: MRI OF THE RIGHT FOREFOOT WITHOUT AND WITH CONTRAST TECHNIQUE: Multiplanar, multisequence MR imaging of the right foot was performed before and after the administration of intravenous contrast. CONTRAST:  10mL GADAVIST  GADOBUTROL  1 MMOL/ML IV SOLN COMPARISON:  Radiographs 07/24/2024 FINDINGS: There is an open wound noted over the lateral aspect of the foot near the fifth metatarsal base. Again demonstrated are prior mid metatarsal amputations. I do not see any findings suspicious for osteomyelitis or septic arthritis. No rim enhancing subcutaneous abscess. Stable appearing exuberant periosteal reaction involving the fourth metatarsal shaft. Diffuse subcutaneous soft tissue swelling/edema suggesting cellulitis. Diffuse myositis and advanced fatty atrophy of the foot musculature but no evidence of pyomyositis. The major tendons and ligaments appear intact. IMPRESSION: 1. Open wound over the lateral aspect of the foot near the fifth metatarsal base. No findings suspicious for osteomyelitis or septic arthritis. 2. Diffuse subcutaneous soft tissue swelling/edema suggesting cellulitis. 3. Diffuse myositis and advanced fatty atrophy of the foot musculature but no evidence of pyomyositis. Electronically Signed   By: MYRTIS Stammer M.D.   On: 07/24/2024 20:10   CT Cervical Spine Wo  Contrast Result Date: 07/24/2024 CLINICAL DATA:  Syncope, fell EXAM: CT CERVICAL SPINE WITHOUT CONTRAST TECHNIQUE: Multidetector CT imaging of the cervical spine was performed without intravenous contrast. Multiplanar CT image reconstructions were also generated. RADIATION DOSE REDUCTION: This exam was performed according to the departmental dose-optimization program which includes automated exposure control, adjustment of the mA and/or kV according to patient size and/or use of iterative reconstruction technique. COMPARISON:  06/01/2024 FINDINGS: Alignment: Slight reversal cervical lordosis likely due to multilevel spondylosis and facet  hypertrophy. Otherwise alignment is anatomic. Skull base and vertebrae: Lytic bony metastases are noted within the spinous process of C2, within the body and right transverse process of C7, and within the T1 vertebral body, similar to recent PET scan. There are no acute displaced fractures. Soft tissues and spinal canal: No prevertebral fluid or swelling. No visible canal hematoma. Disc levels: Bony fusion across the C3-4 facet joints. There is diffuse multilevel spondylosis with disc space narrowing and prominent anterior osteophyte greatest from C4-5 through C7-T1. Upper chest: Mottled sclerosis of the visualized portions of the thoracic cage consistent with bony metastases. Airway is patent. Lung apices are clear. Other: Reconstructed images demonstrate no additional findings. IMPRESSION: 1. No acute cervical spine fracture. 2. Multifocal bony metastases, consistent with metastatic prostate cancer and not appreciably changed from prior PET scan. Electronically Signed   By: Ozell Daring M.D.   On: 07/24/2024 16:42   CT Head Wo Contrast Result Date: 07/24/2024 CLINICAL DATA:  Syncope, fell EXAM: CT HEAD WITHOUT CONTRAST TECHNIQUE: Contiguous axial images were obtained from the base of the skull through the vertex without intravenous contrast. RADIATION DOSE REDUCTION: This  exam was performed according to the departmental dose-optimization program which includes automated exposure control, adjustment of the mA and/or kV according to patient size and/or use of iterative reconstruction technique. COMPARISON:  None Available. FINDINGS: Brain: No acute infarct or hemorrhage. Lateral ventricles and midline structures are unremarkable. No acute extra-axial fluid collections. No mass effect. Vascular: No hyperdense vessel or unexpected calcification. Diffuse atherosclerosis. Skull: Normal. Negative for fracture or focal lesion. Sinuses/Orbits: No acute finding. Other: None. IMPRESSION: 1. No acute intracranial process. Electronically Signed   By: Ozell Daring M.D.   On: 07/24/2024 16:39   DG Chest Portable 1 View Result Date: 07/24/2024 EXAM: 1 VIEW(S) XRAY OF THE CHEST 07/24/2024 04:10:00 PM COMPARISON: Comparison with 01/03/2022. CLINICAL HISTORY: confusion FINDINGS: LUNGS AND PLEURA: Shallow inspiration with linear atelectasis in the lung bases. No airspace consolidation or edema. No pleural effusion. No pneumothorax. HEART AND MEDIASTINUM: Heart size and pulmonary vascularity are normal. No acute abnormality of the cardiac and mediastinal silhouettes. BONES AND SOFT TISSUES: Degenerative changes in the spine and shoulders. IMPRESSION: 1. No acute cardiopulmonary process identified. 2. Shallow inspiration with linear atelectasis in the lung bases. Electronically signed by: Elsie Gravely MD 07/24/2024 04:15 PM EST RP Workstation: [id number]   DG Foot 2 Views Right Result Date: 07/24/2024 CLINICAL DATA:  Right foot pain. EXAM: RIGHT FOOT - 2 VIEW COMPARISON:  None Available. FINDINGS: Status post transmetatarsal amputation. No acute fracture or dislocation. The bones are osteopenic. Degenerative changes of the midfoot and spurring. Soft tissues thickening over the stump. No soft tissue gas. Vascular calcifications noted. IMPRESSION: 1. No acute fracture or dislocation. 2. Status  post transmetatarsal amputation. Electronically Signed   By: Vanetta Chou M.D.   On: 07/24/2024 16:14   DG Foot 2 Views Left Result Date: 07/24/2024 CLINICAL DATA:  Left heel ulcer.  Evaluate for soft tissue gas. EXAM: LEFT FOOT - 2 VIEW COMPARISON:  Left foot radiograph dated 05/03/2021. FINDINGS: No acute fracture or dislocation. The bones are osteopenic. No bone erosion or periosteal elevation. Vascular calcifications noted. No soft tissue gas. IMPRESSION: 1. No acute fracture or dislocation. 2. Osteopenia. Electronically Signed   By: Vanetta Chou M.D.   On: 07/24/2024 16:13      Time coordinating discharge: over 30 minutes  SIGNED:  Klynn Linnemann DO [facility]       [1]  Allergies Allergen Reactions   Sulfa Antibiotics Rash   Piperacillin -Tazobactam In Dex

## 2024-08-15 ENCOUNTER — Encounter (HOSPITAL_COMMUNITY): Payer: Self-pay

## 2024-08-16 ENCOUNTER — Inpatient Hospital Stay: Admitting: Internal Medicine

## 2024-08-16 ENCOUNTER — Encounter: Payer: Self-pay | Admitting: Internal Medicine

## 2024-08-16 ENCOUNTER — Inpatient Hospital Stay: Attending: Internal Medicine

## 2024-08-16 ENCOUNTER — Other Ambulatory Visit: Payer: Self-pay

## 2024-08-16 ENCOUNTER — Other Ambulatory Visit (HOSPITAL_COMMUNITY): Payer: Self-pay

## 2024-08-16 ENCOUNTER — Inpatient Hospital Stay

## 2024-08-16 DIAGNOSIS — N1831 Chronic kidney disease, stage 3a: Secondary | ICD-10-CM | POA: Diagnosis not present

## 2024-08-16 DIAGNOSIS — I739 Peripheral vascular disease, unspecified: Secondary | ICD-10-CM | POA: Insufficient documentation

## 2024-08-16 DIAGNOSIS — C61 Malignant neoplasm of prostate: Secondary | ICD-10-CM

## 2024-08-16 DIAGNOSIS — K5900 Constipation, unspecified: Secondary | ICD-10-CM | POA: Insufficient documentation

## 2024-08-16 DIAGNOSIS — R5383 Other fatigue: Secondary | ICD-10-CM | POA: Insufficient documentation

## 2024-08-16 DIAGNOSIS — E1122 Type 2 diabetes mellitus with diabetic chronic kidney disease: Secondary | ICD-10-CM | POA: Insufficient documentation

## 2024-08-16 DIAGNOSIS — D509 Iron deficiency anemia, unspecified: Secondary | ICD-10-CM

## 2024-08-16 DIAGNOSIS — G893 Neoplasm related pain (acute) (chronic): Secondary | ICD-10-CM | POA: Insufficient documentation

## 2024-08-16 DIAGNOSIS — D649 Anemia, unspecified: Secondary | ICD-10-CM | POA: Diagnosis not present

## 2024-08-16 DIAGNOSIS — C7951 Secondary malignant neoplasm of bone: Secondary | ICD-10-CM | POA: Insufficient documentation

## 2024-08-16 LAB — CBC WITH DIFFERENTIAL (CANCER CENTER ONLY)
Abs Immature Granulocytes: 0.6 K/uL — ABNORMAL HIGH (ref 0.00–0.07)
Basophils Absolute: 0 K/uL (ref 0.0–0.1)
Basophils Relative: 1 %
Eosinophils Absolute: 0 K/uL (ref 0.0–0.5)
Eosinophils Relative: 0 %
HCT: 27 % — ABNORMAL LOW (ref 39.0–52.0)
Hemoglobin: 8.8 g/dL — ABNORMAL LOW (ref 13.0–17.0)
Immature Granulocytes: 12 %
Lymphocytes Relative: 7 %
Lymphs Abs: 0.4 K/uL — ABNORMAL LOW (ref 0.7–4.0)
MCH: 28.7 pg (ref 26.0–34.0)
MCHC: 32.6 g/dL (ref 30.0–36.0)
MCV: 87.9 fL (ref 80.0–100.0)
Monocytes Absolute: 0.9 K/uL (ref 0.1–1.0)
Monocytes Relative: 18 %
Neutro Abs: 3.3 K/uL (ref 1.7–7.7)
Neutrophils Relative %: 62 %
Platelet Count: 340 K/uL (ref 150–400)
RBC: 3.07 MIL/uL — ABNORMAL LOW (ref 4.22–5.81)
RDW: 18.6 % — ABNORMAL HIGH (ref 11.5–15.5)
Smear Review: NORMAL
WBC Count: 5.2 K/uL (ref 4.0–10.5)
nRBC: 2.7 % — ABNORMAL HIGH (ref 0.0–0.2)

## 2024-08-16 LAB — CMP (CANCER CENTER ONLY)
ALT: 10 U/L (ref 0–44)
AST: 16 U/L (ref 15–41)
Albumin: 3.6 g/dL (ref 3.5–5.0)
Alkaline Phosphatase: 127 U/L — ABNORMAL HIGH (ref 38–126)
Anion gap: 11 (ref 5–15)
BUN: 24 mg/dL — ABNORMAL HIGH (ref 8–23)
CO2: 24 mmol/L (ref 22–32)
Calcium: 8.6 mg/dL — ABNORMAL LOW (ref 8.9–10.3)
Chloride: 103 mmol/L (ref 98–111)
Creatinine: 0.93 mg/dL (ref 0.61–1.24)
GFR, Estimated: >60 mL/min (ref >60)
Glucose, Bld: 225 mg/dL — ABNORMAL HIGH (ref 70–99)
Potassium: 4.5 mmol/L (ref 3.5–5.1)
Sodium: 139 mmol/L (ref 135–145)
Total Bilirubin: 0.6 mg/dL (ref 0.0–1.2)
Total Protein: 7.8 g/dL (ref 6.5–8.1)

## 2024-08-16 LAB — SAMPLE TO BLOOD BANK

## 2024-08-16 LAB — IRON AND TIBC
Iron: 105 ug/dL (ref 45–182)
Saturation Ratios: 50 % — ABNORMAL HIGH (ref 17.9–39.5)
TIBC: 210 ug/dL — ABNORMAL LOW (ref 250–450)
UIBC: 105 ug/dL

## 2024-08-16 LAB — FERRITIN: Ferritin: 3472 ng/mL — ABNORMAL HIGH (ref 24–336)

## 2024-08-16 MED ORDER — DEGARELIX ACETATE 80 MG ~~LOC~~ SOLR
80.0000 mg | Freq: Once | SUBCUTANEOUS | Status: AC
  Administered 2024-08-16: 10:00:00 80 mg via SUBCUTANEOUS
  Filled 2024-08-16: qty 4

## 2024-08-16 NOTE — Progress Notes (Signed)
 C/o bed sore on his bottom, using rx creams, pain 5/10.  08/02/24 CT [facility].

## 2024-08-16 NOTE — Progress Notes (Signed)
 Per visit today,Erleada  discontinued due to side effects. Disenrolled

## 2024-08-16 NOTE — Assessment & Plan Note (Addendum)
#   AUG 2025- [hospital]- Metastatic prostate cancer-castrate sensitive de novo; high volume-awaiting r Tempus. Bx  IR:  METASTIC PROSTATE ADENOCARCINOMA. 10/25-2025- PSMA Large volume, relatively diffuse prostatic primary with pelvic nodal and diffuse osseous metastasis.  # on Firmagon  q 4 weeks.s/p radiatio oncology re: prostate cancer- mets to bone.  Poor candidate for chemotherapy; continue Firmagon  on a monthly basis.  Discontinued apalutamide  given possible drug-induced agranulocytosis.  PSA coming down.  Once CBC stabilizes-consider alternative oral drug for prostate cancer like Nubeqa  # Severe neutropenia/agranulocytosis-refractory to Granix - ?  Etiology-drug-induced-bone marrow biopsy-no obvious reason noted.  Parvo negative.  Discontinued apalutamide .  Status post IVIG 1 g/kg x 2; prednisone  100 mg a day-recommend taper prednisone  to 60 mg a day for 1 week and then 40 mg a day for 1 more week-reevaluation in the clinic for further taper based upon labs.  Patient currently on fill gastrin at the facility.  Recommend holding filgrastim  for the next 1 week.  Check CBC and start filgrastim  if ANC less than 1.5 hold off Zarzio at this time.  Consider Zarzio at next visit  # Anemia-likely secondary to anemia of chronic disease/underlying prostate cancer/ Chronic kidney disease stage IIIa- stable; LOW I sat- consider-  stable.  # Multiple bone metastases-ongoing pain high risk for pathologic fracture.s/p  radiation oncology - improved.HOLD zometa sec to dentition.    # Chronic kidney disease stage IIIa- Stable   # Peripheral vascular disease: Needing wound care stable.    # Diabetes monitor closely on current therapy.  # ACP-recommend evaluation with Josh Borders today.   # DISPOSITION: # relay the wrap up medication istruction to pt's APP-RN provider at the facility # family needs a list of medication-  # Firmagon  today; NO zarxio  # follow up in 2 weeks- APP- labs- cbc/cmp; possible  zarxio  # follow up in 4 weeks- MD; labs- cbc/cmp;psa; firmagon ; possible  zarxio  - Dr.B  # 40 minutes face-to-face with the patient discussing the above plan of care; more than 50% of time spent on prognosis/ natural history; counseling and coordination.

## 2024-08-16 NOTE — Progress Notes (Signed)
 [facility] CONSULT NOTE  Patient Care Team: Adina Buel HERO, MD as PCP - General (Family Medicine) [name], MD as Consulting Physician (Oncology) Lenn Aran, MD as Consulting Physician (Radiation Oncology)  CHIEF COMPLAINTS/PURPOSE OF CONSULTATION: Prostate cancer  Oncology History Overview Note  # AUG 2025- [hospital]- Metastatic prostate cancer-castrate sensitive de novo; high volume-awaiting r Tempus. Bx  IR:  METASTIC PROSTATE ADENOCARCINOMA  # on Firmagon  q 4 weeks. awaiting  radiatio oncology re: prostate cancer- mets to bone  #    Prostate cancer metastatic to multiple sites 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  Accompanied by his sister Kieth Hartis 73 y.o.  male pleasant patient with  with metastatic castrate sensitive prostate cancer  high risk/high volume-on ADT chronic right foot ulcer, CKD IIIa, HTN, DM and anemia - is here for a follow up-currently on Firmagon  is here for follow-up.  Discussed the use of AI scribe software for clinical note transcription with the patient, who gave verbal consent to proceed.  History of Present Illness   MALCOM SELMER is a 73 year old male with metastatic, castration-sensitive prostate cancer who presents for routine follow-up and ongoing management.  He is currently receiving treatment for metastatic prostate cancer and is being monitored for disease progression and treatment-related adverse effects.  Patient was recently admitted to hospital for severe  neutropenia/agranulocytosis refractory to Granix  status post IVIG and also currently on prednisone  100 mg a day.  Patient is also getting filgrastim  at the facility.  He has had 2 days of injections so far.  Apalutamide  was discontinued-for possible drug-induced agranulocytosis.  He denies fever, chills, nausea, vomiting, diarrhea, and abdominal pain. He reports stable appetite but notes constipation. He experiences occasional, non-persistent cough. He also reports a sore on his back.      Review of Systems  Constitutional:  Positive for malaise/fatigue. Negative for chills, diaphoresis, fever and weight loss.  HENT:  Negative for nosebleeds and sore throat.   Eyes:  Negative for double vision.  Respiratory:  Negative for cough, hemoptysis, sputum production, shortness of breath and wheezing.   Cardiovascular:  Negative for chest pain, palpitations, orthopnea and leg swelling.  Gastrointestinal:  Negative for abdominal pain, blood in stool, constipation, diarrhea, heartburn, melena, nausea and vomiting.  Genitourinary:  Negative for dysuria, frequency and urgency.  Musculoskeletal:  Positive for back pain and joint pain.  Skin: Negative.  Negative for itching and rash.  Neurological:  Negative for dizziness, tingling, focal weakness, weakness and headaches.  Endo/Heme/Allergies:  Does not bruise/bleed easily.  Psychiatric/Behavioral:  Negative for depression. The patient is not nervous/anxious and does not have insomnia.     MEDICAL HISTORY:  Past Medical History:  Diagnosis Date   Arthritis    Bladder incontinence    Chronic kidney disease    Stage 3 Kidney disease   Diabetes mellitus without complication (HCC)    DVT of leg (deep venous thrombosis) (HCC)    H/O RIGHT LEG 2018   Dyspnea    occassional   Hyperlipidemia  Hypertension    Peripheral vascular disease    Sepsis (HCC)     SURGICAL HISTORY: Past Surgical History:  Procedure Laterality Date   ABDOMINAL AORTOGRAM  W/LOWER EXTREMITY N/A 10/10/2016   Procedure: Abdominal Aortogram w/Lower Extremity;  Surgeon: Cordella KANDICE Shawl, MD;  Location: [facility] INVASIVE CV LAB;  Service: Cardiovascular;  Laterality: N/A;   CATARACT EXTRACTION W/PHACO Left 05/27/2018   Procedure: CATARACT EXTRACTION PHACO AND INTRAOCULAR LENS PLACEMENT (IOC);  Surgeon: Myrna Adine Anes, MD;  Location: [facility] ORS;  Service: Ophthalmology;  Laterality: Left;  US  03:06.2 AP% 16.1 CDE 30.16 FLUID PACK LOT @ [id number] H   CATARACT EXTRACTION W/PHACO Right 07/13/2023   Procedure: CATARACT EXTRACTION PHACO AND INTRAOCULAR LENS PLACEMENT (IOC) RIGHT DIABETIC 4.00 00:36.6;  Surgeon: Myrna Adine Anes, MD;  Location: [facility];  Service: Ophthalmology;  Laterality: Right;   CIRCUMCISION  1998   COLONOSCOPY WITH PROPOFOL  N/A 08/20/2023   Procedure: COLONOSCOPY WITH PROPOFOL ;  Surgeon: Unk Corinn Skiff, MD;  Location: [facility] ENDOSCOPY;  Service: Gastroenterology;  Laterality: N/A;   EYE SURGERY Left    Retina Detachment   GRAFT APPLICATION Right 05/29/2017   Procedure: GRAFT APPLICATION ( RIGHT FOOT );  Surgeon: Shawl Cordella KANDICE, MD;  Location: [facility] ORS;  Service: Vascular;  Laterality: Right;  graft taken from patients right thigh   IRRIGATION AND DEBRIDEMENT FOOT Right 10/03/2016   Procedure: IRRIGATION AND DEBRIDEMENT FOOT;  Surgeon: Eva Gay, DPM;  Location: [facility] ORS;  Service: Podiatry;  Laterality: Right;   LOWER EXTREMITY ANGIOGRAPHY Right 02/24/2023   Procedure: Lower Extremity Angiography;  Surgeon: Shawl Cordella KANDICE, MD;  Location: [facility] INVASIVE CV LAB;  Service: Cardiovascular;  Laterality: Right;   LOWER EXTREMITY INTERVENTION  10/10/2016   Procedure: Lower Extremity Intervention;  Surgeon: Cordella KANDICE Shawl, MD;  Location: [facility] INVASIVE CV LAB;  Service: Cardiovascular;;   PERIPHERAL VASCULAR BALLOON ANGIOPLASTY Left 10/10/2016   Procedure: Peripheral Vascular Balloon Angioplasty;  Surgeon: Cordella KANDICE Shawl, MD;  Location: [facility]  INVASIVE CV LAB;  Service: Cardiovascular;  Laterality: Left;   TONSILLECTOMY     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/2022   Procedure: TRANSMETATARSAL AMPUTATION;  Surgeon: Gay Eva, DPM;  Location: [facility] ORS;  Service: Podiatry;  Laterality: Right;   WOUND DEBRIDEMENT Right 11/07/2016   Procedure: DEBRIDEMENT OF WOUND AND BONE RIGHT FOOT AND APPLY WOUND VAC;  Surgeon: Eva Gay, DPM;  Location: [facility] ORS;  Service: Podiatry;  Laterality: Right;    SOCIAL HISTORY: Social History   Socioeconomic History   Marital status: Single    Spouse name: Not on file   Number of children: Not on file   Years of education: Not on file   Highest education level: Not on file  Occupational History   Not on file  Tobacco Use   Smoking status: Former    Types: Pipe, Cigarettes    Quit date: 05/23/1975    Years since quitting: 49.2   Smokeless tobacco: Never  Vaping Use   Vaping status: Never Used  Substance and Sexual Activity   Alcohol use: No   Drug use: No   Sexual activity: Not on file  Other Topics Concern   Not on file  Social History Narrative   Not on file   Social Drivers of Health   Tobacco Use: Medium Risk (08/16/2024)   Patient History    Smoking Tobacco Use: Former    Smokeless Tobacco Use: Never    Passive Exposure: Not on file  Financial Resource Strain: Low Risk  (08/28/2023)  Received from [facility]   Overall Financial Resource Strain (CARDIA)    Difficulty of Paying Living Expenses: Not hard at all  Recent Concern: Financial Resource Strain - Medium Risk (08/06/2023)   Received from [facility]   Overall Financial Resource Strain (CARDIA)    Difficulty of Paying Living Expenses: Somewhat hard  Food Insecurity: No Food Insecurity (07/29/2024)   Epic    Worried About Running Out of Food in the Last Year: Never true    Ran Out of Food in the Last Year: Never true  Transportation Needs: No Transportation Needs (07/29/2024)    Epic    Lack of Transportation (Medical): No    Lack of Transportation (Non-Medical): No  Physical Activity: Inactive (08/28/2023)   Received from [facility]   Exercise Vital Sign    On average, how many days per week do you engage in moderate to strenuous exercise (like a brisk walk)?: 0 days    On average, how many minutes do you engage in exercise at this level?: 0 min  Stress: No Stress Concern Present (08/28/2023)   Received from [facility] - Occupational Stress Questionnaire    Feeling of Stress : Not at all  Social Connections: Moderately Integrated (07/29/2024)   Social Connection and Isolation Panel    Frequency of Communication with Friends and Family: Three times a week    Frequency of Social Gatherings with Friends and Family: Three times a week    Attends Religious Services: 1 to 4 times per year    Active Member of Clubs or Organizations: Yes    Attends Banker Meetings: 1 to 4 times per year    Marital Status: Never married  Intimate Partner Violence: Not At Risk (07/29/2024)   Epic    Fear of Current or Ex-Partner: No    Emotionally Abused: No    Physically Abused: No    Sexually Abused: No  Depression (PHQ2-9): Low Risk (08/16/2024)   Depression (PHQ2-9)    PHQ-2 Score: 0  Alcohol Screen: Not on file  Housing: High Risk (07/29/2024)   Epic    Unable to Pay for Housing in the Last Year: No    Number of Times Moved in the Last Year: 2    Homeless in the Last Year: No  Utilities: Not At Risk (07/29/2024)   Epic    Threatened with loss of utilities: No  Health Literacy: Adequate Health Literacy (08/28/2023)   Received from [facility]   B1300 Health Literacy    Frequency of need for help with medical instructions: Never    FAMILY HISTORY: Family History  Problem Relation Age of Onset   Diabetes Mother    Hypertension Mother    Diabetes Father     Hypertension Father    Cancer Father     ALLERGIES:  is allergic to sulfa antibiotics and piperacillin -tazobactam in dex.  MEDICATIONS:  Current Outpatient Medications  Medication Sig Dispense Refill   acetaminophen  (TYLENOL ) 325 MG tablet Take 2 tablets (650 mg total) by mouth every 6 (six) hours as needed for mild pain (pain score 1-3) or fever (or Fever >/= 101).     amoxicillin  (AMOXIL ) 500 MG capsule Take 1 capsule (500 mg total) by mouth every 8 (eight) hours for 5 days. Continue or stop per podiatry instructions     aspirin  EC 81 MG tablet Take 81 mg by mouth daily.  Swallow whole.     atorvastatin  (LIPITOR) 10 MG tablet Take 10 mg by mouth every evening.     collagenase  (SANTYL ) 250 UNIT/GM ointment Apply topically daily. To wound / pressure ulcers     cyanocobalamin  1000 MCG tablet Take 1 tablet (1,000 mcg total) by mouth daily.     filgrastim -aafi (NIVESTYM ) 480 MCG/0.8ML SOSY injection Inject 0.8 mLs (480 mcg total) into the skin daily.     linagliptin  (TRADJENTA ) 5 MG TABS tablet Take 1 tablet (5 mg total) by mouth daily.     loperamide  (IMODIUM ) 2 MG capsule Take 2 capsules (4 mg total) by mouth every 8 (eight) hours as needed for diarrhea or loose stools.     LORazepam  (ATIVAN ) 1 MG tablet Take 1 tablet (1 mg total) by mouth every 8 (eight) hours. 15 tablet 0   metFORMIN  (GLUCOPHAGE ) 1000 MG tablet Take 1,000 mg by mouth 2 (two) times daily with a meal.     midodrine  (PROAMATINE ) 5 MG tablet Take 1 tablet (5 mg total) by mouth 3 (three) times daily with meals.     ondansetron  (ZOFRAN ) 4 MG tablet Take 1 tablet (4 mg total) by mouth every 6 (six) hours as needed for nausea.     predniSONE  (DELTASONE ) 20 MG tablet Take 3 tablets (60 mg total) by mouth daily with breakfast. Further refill / taper instructions per oncology     Nystatin (GERHARDT'S BUTT CREAM) CREA Apply 1 Application topically 2 (two) times daily. (Patient not taking: Reported on 08/16/2024)     No current  facility-administered medications for this visit.    PHYSICAL EXAMINATION:   Vitals:   08/16/24 0905  BP: (!) 103/55  Pulse: (!) 103  Resp: 18  Temp: 97.8 F (36.6 C)  SpO2: 100%   Filed Weights   08/16/24 0905  Weight: 275 lb (124.7 kg)    Physical Exam Vitals and nursing note reviewed.  HENT:     Head: Normocephalic and atraumatic.     Mouth/Throat:     Pharynx: Oropharynx is clear.  Eyes:     Extraocular Movements: Extraocular movements intact.     Pupils: Pupils are equal, round, and reactive to light.  Cardiovascular:     Rate and Rhythm: Normal rate and regular rhythm.  Pulmonary:     Comments: Decreased breath sounds bilaterally.  Abdominal:     Palpations: Abdomen is soft.  Musculoskeletal:        General: Normal range of motion.     Cervical back: Normal range of motion.  Skin:    General: Skin is warm.  Neurological:     General: No focal deficit present.     Mental Status: He is alert and oriented to person, place, and time.  Psychiatric:        Behavior: Behavior normal.        Judgment: Judgment normal.     LABORATORY DATA:  I have reviewed the data as listed Lab Results  Component Value Date   WBC 5.2 08/16/2024   HGB 8.8 (L) 08/16/2024   HCT 27.0 (L) 08/16/2024   MCV 87.9 08/16/2024   PLT 340 08/16/2024   Recent Labs    07/07/24 1319 07/24/24 1533 07/25/24 0520 07/29/24 0543 07/31/24 0421 08/01/24 0429 08/07/24 0504 08/09/24 0534 08/11/24 0352 08/16/24 0849  NA 136 140   < > 138  --  135  --   --  131* 139  K 4.2 4.5   < > 4.2  --  4.5  --   --  4.1 4.5  CL 105 109   < > 108  --  103  --   --  101 103  CO2 23 19*   < > 22  --  22  --   --  18* 24  GLUCOSE 62* 71   < > 116*  --  117*  --   --  154* 225*  BUN 17 20   < > 11  --  11  --   --  25* 24*  CREATININE 0.84 0.91   < > 0.72   < > 0.73   < > 0.76 0.89 0.93  CALCIUM  8.6* 8.4*   < > 7.7*  --  8.1*  --   --  8.6* 8.6*  GFRNONAA >60 >60   < > >60   < > >60   < > >60 >60  >60  PROT 7.7 6.5  --   --   --   --   --   --   --  7.8  ALBUMIN 3.6 3.3*   < > 2.5*  --  2.8*  --   --   --  3.6  AST 19 37  --   --   --   --   --   --   --  16  ALT 15 12  --   --   --   --   --   --   --  10  ALKPHOS 176* 133*  --   --   --   --   --   --   --  127*  BILITOT 0.5 0.2  --   --   --   --   --   --   --  0.6   < > = values in this interval not displayed.    RADIOGRAPHIC STUDIES: I have personally reviewed the radiological images as listed and agreed with the findings in the report. CT BONE MARROW BIOPSY & ASPIRATION Result Date: 08/02/2024 INDICATION: [id number] Neutropenia, unspecified [id number] EXAM: CT GUIDED BONE MARROW ASPIRATION AND CORE BIOPSY MEDICATIONS: None. ANESTHESIA/SEDATION: Moderate (conscious) sedation was employed during this procedure. A total of Versed  2 mg and Fentanyl  100 mcg was administered intravenously. Moderate Sedation Time: 15 minutes. The patient's level of consciousness and vital signs were monitored continuously by radiology nursing throughout the procedure under my direct supervision. FLUOROSCOPY TIME:  CT dose in mGy was not provided. COMPLICATIONS: None immediate. Estimated blood loss: <5 mL PROCEDURE: RADIATION DOSE REDUCTION: This exam was performed according to the departmental dose-optimization program which includes automated exposure control, adjustment of the mA and/or kV according to patient size and/or use of iterative reconstruction technique. Informed written consent was obtained from the patient after a thorough discussion of the procedural risks, benefits and alternatives. All questions were addressed. Maximal Sterile Barrier Technique was utilized including caps, mask, sterile gowns, sterile gloves, sterile drape, hand hygiene and skin antiseptic. A timeout was performed prior to the initiation of the procedure. The patient was positioned prone and non-contrast localization CT was performed of the pelvis to demonstrate the iliac marrow spaces.  Under sterile conditions and local anesthesia, an 11 gauge coaxial bone biopsy needle was advanced into the LEFT iliac marrow space. Needle position was confirmed with CT imaging. Initially, bone marrow aspiration was performed. Next, the 11 gauge outer cannula was utilized to obtain a 1 iliac bone marrow core biopsy. Needle was removed. Hemostasis was obtained with compression. The patient tolerated the  procedure well. Samples were prepared with the cytotechnologist. IMPRESSION: Successful CT-guided bone marrow aspiration and biopsy. Thom Hall, MD [facility] Radiology Electronically Signed   By: Thom Hall M.D.   On: 08/02/2024 11:38   DG Chest Port 1 View Result Date: 07/26/2024 EXAM: 1 VIEW(S) XRAY OF THE CHEST 07/26/2024 04:23:00 PM COMPARISON: 07/24/2024 CLINICAL HISTORY: Neutropenic fever FINDINGS: LUNGS AND PLEURA: Low lung volumes. Stable left lung base atelectasis. No pleural effusion. No pneumothorax. HEART AND MEDIASTINUM: No acute abnormality of the cardiac and mediastinal silhouettes. BONES AND SOFT TISSUES: No acute osseous abnormality. IMPRESSION: 1. Stable left lung base atelectasis. Electronically signed by: Oneil Devonshire MD 07/26/2024 08:09 PM EST RP Workstation: [id number]   MR FOOT RIGHT W WO CONTRAST Result Date: 07/24/2024 CLINICAL DATA:  Right foot ulcer.  History of prior amputations. EXAM: MRI OF THE RIGHT FOREFOOT WITHOUT AND WITH CONTRAST TECHNIQUE: Multiplanar, multisequence MR imaging of the right foot was performed before and after the administration of intravenous contrast. CONTRAST:  10mL GADAVIST  GADOBUTROL  1 MMOL/ML IV SOLN COMPARISON:  Radiographs 07/24/2024 FINDINGS: There is an open wound noted over the lateral aspect of the foot near the fifth metatarsal base. Again demonstrated are prior mid metatarsal amputations. I do not see any findings suspicious for osteomyelitis or septic arthritis. No rim enhancing subcutaneous  abscess. Stable appearing exuberant periosteal reaction involving the fourth metatarsal shaft. Diffuse subcutaneous soft tissue swelling/edema suggesting cellulitis. Diffuse myositis and advanced fatty atrophy of the foot musculature but no evidence of pyomyositis. The major tendons and ligaments appear intact. IMPRESSION: 1. Open wound over the lateral aspect of the foot near the fifth metatarsal base. No findings suspicious for osteomyelitis or septic arthritis. 2. Diffuse subcutaneous soft tissue swelling/edema suggesting cellulitis. 3. Diffuse myositis and advanced fatty atrophy of the foot musculature but no evidence of pyomyositis. Electronically Signed   By: MYRTIS Stammer M.D.   On: 07/24/2024 20:10   CT Cervical Spine Wo Contrast Result Date: 07/24/2024 CLINICAL DATA:  Syncope, fell EXAM: CT CERVICAL SPINE WITHOUT CONTRAST TECHNIQUE: Multidetector CT imaging of the cervical spine was performed without intravenous contrast. Multiplanar CT image reconstructions were also generated. RADIATION DOSE REDUCTION: This exam was performed according to the departmental dose-optimization program which includes automated exposure control, adjustment of the mA and/or kV according to patient size and/or use of iterative reconstruction technique. COMPARISON:  06/01/2024 FINDINGS: Alignment: Slight reversal cervical lordosis likely due to multilevel spondylosis and facet hypertrophy. Otherwise alignment is anatomic. Skull base and vertebrae: Lytic bony metastases are noted within the spinous process of C2, within the body and right transverse process of C7, and within the T1 vertebral body, similar to recent PET scan. There are no acute displaced fractures. Soft tissues and spinal canal: No prevertebral fluid or swelling. No visible canal hematoma. Disc levels: Bony fusion across the C3-4 facet joints. There is diffuse multilevel spondylosis with disc space narrowing and prominent anterior osteophyte greatest from C4-5  through C7-T1. Upper chest: Mottled sclerosis of the visualized portions of the thoracic cage consistent with bony metastases. Airway is patent. Lung apices are clear. Other: Reconstructed images demonstrate no additional findings. IMPRESSION: 1. No acute cervical spine fracture. 2. Multifocal bony metastases, consistent with metastatic prostate cancer and not appreciably changed from prior PET scan. Electronically Signed   By: Ozell Daring M.D.   On: 07/24/2024 16:42   CT Head Wo Contrast Result Date: 07/24/2024 CLINICAL DATA:  Syncope, fell EXAM: CT HEAD WITHOUT CONTRAST TECHNIQUE: Contiguous  axial images were obtained from the base of the skull through the vertex without intravenous contrast. RADIATION DOSE REDUCTION: This exam was performed according to the departmental dose-optimization program which includes automated exposure control, adjustment of the mA and/or kV according to patient size and/or use of iterative reconstruction technique. COMPARISON:  None Available. FINDINGS: Brain: No acute infarct or hemorrhage. Lateral ventricles and midline structures are unremarkable. No acute extra-axial fluid collections. No mass effect. Vascular: No hyperdense vessel or unexpected calcification. Diffuse atherosclerosis. Skull: Normal. Negative for fracture or focal lesion. Sinuses/Orbits: No acute finding. Other: None. IMPRESSION: 1. No acute intracranial process. Electronically Signed   By: Ozell Daring M.D.   On: 07/24/2024 16:39   DG Chest Portable 1 View Result Date: 07/24/2024 EXAM: 1 VIEW(S) XRAY OF THE CHEST 07/24/2024 04:10:00 PM COMPARISON: Comparison with 01/03/2022. CLINICAL HISTORY: confusion FINDINGS: LUNGS AND PLEURA: Shallow inspiration with linear atelectasis in the lung bases. No airspace consolidation or edema. No pleural effusion. No pneumothorax. HEART AND MEDIASTINUM: Heart size and pulmonary vascularity are normal. No acute abnormality of the cardiac and mediastinal silhouettes.  BONES AND SOFT TISSUES: Degenerative changes in the spine and shoulders. IMPRESSION: 1. No acute cardiopulmonary process identified. 2. Shallow inspiration with linear atelectasis in the lung bases. Electronically signed by: Elsie Gravely MD 07/24/2024 04:15 PM EST RP Workstation: [id number]   DG Foot 2 Views Right Result Date: 07/24/2024 CLINICAL DATA:  Right foot pain. EXAM: RIGHT FOOT - 2 VIEW COMPARISON:  None Available. FINDINGS: Status post transmetatarsal amputation. No acute fracture or dislocation. The bones are osteopenic. Degenerative changes of the midfoot and spurring. Soft tissues thickening over the stump. No soft tissue gas. Vascular calcifications noted. IMPRESSION: 1. No acute fracture or dislocation. 2. Status post transmetatarsal amputation. Electronically Signed   By: Vanetta Chou M.D.   On: 07/24/2024 16:14   DG Foot 2 Views Left Result Date: 07/24/2024 CLINICAL DATA:  Left heel ulcer.  Evaluate for soft tissue gas. EXAM: LEFT FOOT - 2 VIEW COMPARISON:  Left foot radiograph dated 05/03/2021. FINDINGS: No acute fracture or dislocation. The bones are osteopenic. No bone erosion or periosteal elevation. Vascular calcifications noted. No soft tissue gas. IMPRESSION: 1. No acute fracture or dislocation. 2. Osteopenia. Electronically Signed   By: Vanetta Chou M.D.   On: 07/24/2024 16:13      Prostate cancer metastatic to multiple sites [facility]) # AUG 2025- [hospital]- Metastatic prostate cancer-castrate sensitive de novo; high volume-awaiting r Tempus. Bx  IR:  METASTIC PROSTATE ADENOCARCINOMA. 10/25-2025- [facility] Large volume, relatively diffuse prostatic primary with pelvic nodal and diffuse osseous metastasis.  # on Firmagon  q 4 weeks.s/p radiatio oncology re: prostate cancer- mets to bone.  Poor candidate for chemotherapy; continue Firmagon  on a monthly basis.  Discontinued apalutamide  given possible drug-induced agranulocytosis.  PSA coming down.  Once CBC stabilizes-consider  alternative oral drug for prostate cancer like Nubeqa  # Severe neutropenia/agranulocytosis-refractory to Granix - ?  Etiology-drug-induced-bone marrow biopsy-no obvious reason noted.  Parvo negative.  Discontinued apalutamide .  Status post IVIG 1 g/kg x 2; prednisone  100 mg a day-recommend taper prednisone  to 60 mg a day for 1 week and then 40 mg a day for 1 more week-reevaluation in the clinic for further taper based upon labs.  Patient currently on fill gastrin at the facility.  Recommend holding filgrastim  for the next 1 week.  Check CBC and start filgrastim  if ANC less than 1.5 hold off Zarzio at this time.  Consider Zarzio at next visit  #  Anemia-likely secondary to anemia of chronic disease/underlying prostate cancer/ Chronic kidney disease stage IIIa- stable; LOW I sat- consider-  stable.  # Multiple bone metastases-ongoing pain high risk for pathologic fracture.s/p  radiation oncology - improved.HOLD zometa sec to dentition.    # Chronic kidney disease stage IIIa- Stable   # Peripheral vascular disease: Needing wound care stable.    # Diabetes monitor closely on current therapy.  # ACP-recommend evaluation with Josh Borders today.   # DISPOSITION: # relay the wrap up medication istruction to pt's APP-RN provider at the facility # family needs a list of medication-  # Firmagon  today; NO zarxio  # follow up in 2 weeks- APP- labs- cbc/cmp; possible zarxio  # follow up in 4 weeks- MD; labs- cbc/cmp;psa; firmagon ; possible  zarxio  - Dr.B  # 40 minutes face-to-face with the patient discussing the above plan of care; more than 50% of time spent on prognosis/ natural history; counseling and coordination.     Above plan of care was discussed with patient/family in detail.  My contact information was given to the patient/family.     Cindy JONELLE Joe, MD 08/16/2024 10:35 AM

## 2024-08-16 NOTE — Patient Instructions (Signed)
#   recommend taper prednisone  to 60 mg a day for 1 week and then 40 mg a day for 1 more week-reevaluation in the clinic for further taper based upon labs.    # Recommend holding filgrastim  for the next 1 week.  Check CBC and start filgrastim  daily if ANC less than 1.5

## 2024-08-17 LAB — PSA: Prostatic Specific Antigen: 72.9 ng/mL — ABNORMAL HIGH (ref 0.00–4.00)

## 2024-08-22 ENCOUNTER — Encounter: Payer: Self-pay | Admitting: Emergency Medicine

## 2024-08-22 ENCOUNTER — Telehealth: Payer: Self-pay | Admitting: *Deleted

## 2024-08-22 NOTE — Telephone Encounter (Signed)
 Taya Emergency Planning/management Officer from [facility] in [place] called to inquire if patient needs the filgrastim  daily at the facility. The are unable to get this medication in as it must come from the specialty pharmacy. She stated that the medication is listed on their orders to be given daily. However, the facility does not have it and patient has never received it at the facility. She wanted to confirm if patient still needs the medication. The nursing home stated that they are currently unable to get the medication.  Per Dr. Rona last note, patient did not require the zarxio  when he came to the clinic on 08/16/24. On this day, Dr. Rona notes states that patient has received this at the facility. Pt's hospital discharge papers had filgrastim  orders to be given daily at the facility.  I spoke to Dr. Rennie. He stated that the facility mar had filgrastim  on patient's current orders. Patient had stated that he had received 2 injections at the facility per Dr. KATHEE. Per md- if patient was not taking the filgrastim  then the blood counts/bone marrow have rebounded on its own without additional daily injection support. Patient should keep his apts as scheduled with Morna on 09/09/24 as planned.  Taya stated that she appreciated our team collaborating with her on thsi patient's care. She will cnl the facility's order for the filgrastim  at the facility at this time (since they are unable to get this medication and it's not needed at the present time.)

## 2024-08-30 ENCOUNTER — Encounter: Payer: Self-pay | Admitting: Internal Medicine

## 2024-08-30 ENCOUNTER — Encounter: Payer: Self-pay | Admitting: Emergency Medicine

## 2024-08-30 ENCOUNTER — Inpatient Hospital Stay

## 2024-08-30 ENCOUNTER — Inpatient Hospital Stay (HOSPITAL_BASED_OUTPATIENT_CLINIC_OR_DEPARTMENT_OTHER): Admitting: Internal Medicine

## 2024-08-30 VITALS — BP 103/63 | HR 97 | Temp 98.2°F | Resp 18 | Ht 71.0 in

## 2024-08-30 DIAGNOSIS — C61 Malignant neoplasm of prostate: Secondary | ICD-10-CM | POA: Diagnosis not present

## 2024-08-30 LAB — CBC WITH DIFFERENTIAL (CANCER CENTER ONLY)
Abs Immature Granulocytes: 0.03 K/uL (ref 0.00–0.07)
Basophils Absolute: 0 K/uL (ref 0.0–0.1)
Basophils Relative: 0 %
Eosinophils Absolute: 0 K/uL (ref 0.0–0.5)
Eosinophils Relative: 0 %
HCT: 26.7 % — ABNORMAL LOW (ref 39.0–52.0)
Hemoglobin: 8.8 g/dL — ABNORMAL LOW (ref 13.0–17.0)
Immature Granulocytes: 1 %
Lymphocytes Relative: 4 %
Lymphs Abs: 0.2 K/uL — ABNORMAL LOW (ref 0.7–4.0)
MCH: 29.8 pg (ref 26.0–34.0)
MCHC: 33 g/dL (ref 30.0–36.0)
MCV: 90.5 fL (ref 80.0–100.0)
Monocytes Absolute: 0.6 K/uL (ref 0.1–1.0)
Monocytes Relative: 14 %
Neutro Abs: 3.5 K/uL (ref 1.7–7.7)
Neutrophils Relative %: 81 %
Platelet Count: 254 K/uL (ref 150–400)
RBC: 2.95 MIL/uL — ABNORMAL LOW (ref 4.22–5.81)
RDW: 19.9 % — ABNORMAL HIGH (ref 11.5–15.5)
WBC Count: 4.3 K/uL (ref 4.0–10.5)
nRBC: 0 % (ref 0.0–0.2)

## 2024-08-30 LAB — CMP (CANCER CENTER ONLY)
ALT: 11 U/L (ref 0–44)
AST: 13 U/L — ABNORMAL LOW (ref 15–41)
Albumin: 3.9 g/dL (ref 3.5–5.0)
Alkaline Phosphatase: 103 U/L (ref 38–126)
Anion gap: 12 (ref 5–15)
BUN: 11 mg/dL (ref 8–23)
CO2: 25 mmol/L (ref 22–32)
Calcium: 8.7 mg/dL — ABNORMAL LOW (ref 8.9–10.3)
Chloride: 99 mmol/L (ref 98–111)
Creatinine: 0.86 mg/dL (ref 0.61–1.24)
GFR, Estimated: >60 mL/min
Glucose, Bld: 192 mg/dL — ABNORMAL HIGH (ref 70–99)
Potassium: 4.5 mmol/L (ref 3.5–5.1)
Sodium: 136 mmol/L (ref 135–145)
Total Bilirubin: 0.5 mg/dL (ref 0.0–1.2)
Total Protein: 7 g/dL (ref 6.5–8.1)

## 2024-08-30 NOTE — Progress Notes (Signed)
 [name]  Patient Care Team: Adina Buel HERO, MD as PCP - General (Family Medicine) [name], MD as Consulting Physician (Oncology) Lenn Aran, MD as Consulting Physician (Radiation Oncology)  CHIEF COMPLAINTS/PURPOSE OF CONSULTATION: Prostate cancer  Oncology History Overview Note  # AUG 2025- [hospital]- Metastatic prostate cancer-castrate sensitive de novo; high volume-awaiting r Tempus. Bx  IR:  METASTIC PROSTATE ADENOCARCINOMA  # on Firmagon  q 4 weeks. 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  Accompanied by his sister [name] 73 y.o.  male pleasant patient with  with metastatic castrate sensitive prostate cancer  high risk/high volume-on ADT chronic right foot ulcer, CKD IIIa, HTN, DM and anemia - is here for a follow up-currently on Firmagon  is here for follow-up.  Discussed the use of AI scribe software for clinical note transcription with the patient, who gave verbal consent to proceed.  History of Present Illness   [name] is a 73 year old male with metastatic prostate cancer and recent drug-induced pancytopenia who presents for hematology-oncology follow-up.  He has metastatic prostate cancer and is receiving ongoing hormone therapy. He received a hormone injection on August 16, 2024, with the next dose scheduled for mid-January. He denies new cancer-related symptoms.  He  was recently hospitalized for severe neutropenia, anemia, and thrombocytopenia. At the time of admission, his white blood cell count was nearly zero, but has since improved to 4,000/L following prolonged infusions. He is currently residing at a rehabilitation facility and notes continued improvement in blood counts without new symptoms related to cytopenias.  He has diabetes mellitus and was transitioned from insulin  to oral hypoglycemic agents during hospitalization due to poor vision. Glycemic management is ongoing at the rehabilitation facility.       Review of Systems  Constitutional:  Positive for malaise/fatigue. Negative for chills, diaphoresis, fever and weight loss.  HENT:  Negative for nosebleeds and sore throat.   Eyes:  Negative for double vision.  Respiratory:  Negative for cough, hemoptysis, sputum production, shortness of breath and wheezing.   Cardiovascular:  Negative for chest pain, palpitations, orthopnea and leg swelling.  Gastrointestinal:  Negative for abdominal pain, blood in stool, constipation, diarrhea, heartburn, melena, nausea and vomiting.  Genitourinary:  Negative for dysuria, frequency and urgency.  Musculoskeletal:  Positive for back pain and joint pain.  Skin: Negative.  Negative for itching and rash.  Neurological:  Negative for dizziness, tingling, focal weakness, weakness and headaches.  Endo/Heme/Allergies:  Does not bruise/bleed easily.  Psychiatric/Behavioral:  Negative for depression. The patient is not nervous/anxious and does not have insomnia.     MEDICAL HISTORY:  Past Medical History:  Diagnosis Date   Arthritis    Bladder incontinence    Chronic kidney disease    Stage 3 Kidney disease   Diabetes mellitus without complication (HCC)    DVT of leg (deep venous thrombosis) (HCC)    H/O RIGHT LEG 2018  Dyspnea    occassional   Hyperlipidemia    Hypertension    Peripheral vascular disease    Sepsis (HCC)     SURGICAL HISTORY: Past  Surgical History:  Procedure Laterality Date   ABDOMINAL AORTOGRAM W/LOWER EXTREMITY N/A 10/10/2016   Procedure: Abdominal Aortogram w/Lower Extremity;  Surgeon: Cordella KANDICE Shawl, MD;  Location: [facility];  Service: Cardiovascular;  Laterality: N/A;   CATARACT EXTRACTION W/PHACO Left 05/27/2018   Procedure: CATARACT EXTRACTION PHACO AND INTRAOCULAR LENS PLACEMENT (IOC);  Surgeon: Myrna Adine Anes, MD;  Location: [facility] ORS;  Service: Ophthalmology;  Laterality: Left;  US  03:06.2 AP% 16.1 CDE 30.16 FLUID PACK LOT @ [id number] H   CATARACT EXTRACTION W/PHACO Right 07/13/2023   Procedure: CATARACT EXTRACTION PHACO AND INTRAOCULAR LENS PLACEMENT (IOC) RIGHT DIABETIC 4.00 00:36.6;  Surgeon: Myrna Adine Anes, MD;  Location: [facility];  Service: Ophthalmology;  Laterality: Right;   CIRCUMCISION  1998   COLONOSCOPY WITH PROPOFOL  N/A 08/20/2023   Procedure: COLONOSCOPY WITH PROPOFOL ;  Surgeon: Unk Corinn Skiff, MD;  Location: [facility] ENDOSCOPY;  Service: Gastroenterology;  Laterality: N/A;   EYE SURGERY Left    Retina Detachment   GRAFT APPLICATION Right 05/29/2017   Procedure: GRAFT APPLICATION ( RIGHT FOOT );  Surgeon: Shawl Cordella KANDICE, MD;  Location: [facility] ORS;  Service: Vascular;  Laterality: Right;  graft taken from patients right thigh   IRRIGATION AND DEBRIDEMENT FOOT Right 10/03/2016   Procedure: IRRIGATION AND DEBRIDEMENT FOOT;  Surgeon: Eva Gay, DPM;  Location: [facility] ORS;  Service: Podiatry;  Laterality: Right;   LOWER EXTREMITY ANGIOGRAPHY Right 02/24/2023   Procedure: Lower Extremity Angiography;  Surgeon: Shawl Cordella KANDICE, MD;  Location: [facility];  Service: Cardiovascular;  Laterality: Right;   LOWER EXTREMITY INTERVENTION  10/10/2016   Procedure: Lower Extremity Intervention;  Surgeon: Cordella KANDICE Shawl, MD;  Location: [facility];  Service: Cardiovascular;;   PERIPHERAL VASCULAR BALLOON ANGIOPLASTY Left 10/10/2016   Procedure: Peripheral Vascular  Balloon Angioplasty;  Surgeon: Cordella KANDICE Shawl, MD;  Location: [facility];  Service: Cardiovascular;  Laterality: Left;   TONSILLECTOMY     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/2022   Procedure: TRANSMETATARSAL AMPUTATION;  Surgeon: Gay Eva, DPM;  Location: [facility] ORS;  Service: Podiatry;  Laterality: Right;   WOUND DEBRIDEMENT Right 11/07/2016   Procedure: DEBRIDEMENT OF WOUND AND BONE RIGHT FOOT AND APPLY WOUND VAC;  Surgeon: Eva Gay, DPM;  Location: [facility] OR[facility];  Service: Podiatry;  Laterality: Right;    SOCIAL HISTORY: Social History   Socioeconomic History   Marital status: Single    Spouse name: Not on file   Number of children: Not on file   Years of education: Not on file   Highest education level: Not on file  Occupational History   Not on file  Tobacco Use   Smoking status: Former    Types: Pipe, Cigarettes    Quit date: 05/23/1975    Years since quitting: 49.3   Smokeless tobacco: Never  Vaping Use   Vaping status: Never Used  Substance and Sexual Activity   Alcohol use: No   Drug use: No   Sexual activity: Not on file  Other Topics Concern   Not on file  Social History Narrative   Not on file   Social Drivers of Health   Tobacco Use: Medium Risk (08/30/2024)   Patient History    Smoking Tobacco Use: Former    Smokeless Tobacco Use: Never    Passive Exposure: Not  on file  Financial Resource Strain: Low Risk  (08/28/2023)   Received from [facility]   Overall Financial Resource Strain (CARDIA)    Difficulty of Paying Living Expenses: Not hard at all  Recent Concern: Financial Resource Strain - Medium Risk (08/06/2023)   Received from [facility]   Overall Financial Resource Strain (CARDIA)    Difficulty of Paying Living Expenses: Somewhat hard  Food Insecurity: No Food Insecurity (07/29/2024)   Epic    Worried About Running Out of Food in the Last Year: Never true    Ran Out of Food in the Last Year:  Never true  Transportation Needs: No Transportation Needs (07/29/2024)   Epic    Lack of Transportation (Medical): No    Lack of Transportation (Non-Medical): No  Physical Activity: Inactive (08/28/2023)   Received from [facility]   Exercise Vital Sign    On average, how many days per week do you engage in moderate to strenuous exercise (like a brisk walk)?: 0 days    On average, how many minutes do you engage in exercise at this level?: 0 min  Stress: No Stress Concern Present (08/28/2023)   Received from [facility] of Occupational [facility] - Occupational Stress Questionnaire    Feeling of Stress : Not at all  Social Connections: Moderately Integrated (07/29/2024)   Social Connection and Isolation Panel    Frequency of Communication with Friends and Family: Three times a week    Frequency of Social Gatherings with Friends and Family: Three times a week    Attends Religious Services: 1 to 4 times per year    Active Member of Clubs or Organizations: Yes    Attends Banker Meetings: 1 to 4 times per year    Marital Status: Never married  Intimate Partner Violence: Not At Risk (07/29/2024)   Epic    Fear of Current or Ex-Partner: No    Emotionally Abused: No    Physically Abused: No    Sexually Abused: No  Depression (PHQ2-9): Low Risk (08/30/2024)   Depression (PHQ2-9)    PHQ-2 Score: 0  Alcohol Screen: Not on file  Housing: High Risk (07/29/2024)   Epic    Unable to Pay for Housing in the Last Year: No    Number of Times Moved in the Last Year: 2    Homeless in the Last Year: No  Utilities: Not At Risk (07/29/2024)   Epic    Threatened with loss of utilities: No  Health Literacy: Adequate Health Literacy (08/28/2023)   Received from [facility]   B1300 Health Literacy    Frequency of need for help with medical instructions: Never    FAMILY HISTORY: Family History  Problem Relation Age  of Onset   Diabetes Mother    Hypertension Mother    Diabetes Father    Hypertension Father    Cancer Father     ALLERGIES:  is allergic to sulfa antibiotics and piperacillin -tazobactam in dex.  MEDICATIONS:  Current Outpatient Medications  Medication Sig Dispense Refill   acetaminophen  (TYLENOL ) 325 MG tablet Take 2 tablets (650 mg total) by mouth every 6 (six) hours as needed for mild pain (pain score 1-3) or fever (or Fever >/= 101).     ascorbic Acid (VITAMIN C) 500 MG CPCR Take 500 mg by mouth daily.     aspirin  EC 81 MG tablet Take 81 mg by mouth daily. Swallow  whole.     atorvastatin  (LIPITOR) 10 MG tablet Take 10 mg by mouth every evening.     collagenase  (SANTYL ) 250 UNIT/GM ointment Apply topically daily. To wound / pressure ulcers     cyanocobalamin  1000 MCG tablet Take 1 tablet (1,000 mcg total) by mouth daily.     filgrastim -aafi (NIVESTYM ) 480 MCG/0.8ML SOSY injection Inject 0.8 mLs (480 mcg total) into the skin daily.     loperamide  (IMODIUM ) 2 MG capsule Take 2 capsules (4 mg total) by mouth every 8 (eight) hours as needed for diarrhea or loose stools.     LORazepam  (ATIVAN ) 1 MG tablet Take 1 tablet (1 mg total) by mouth every 8 (eight) hours. 15 tablet 0   metFORMIN  (GLUCOPHAGE ) 1000 MG tablet Take 1,000 mg by mouth 2 (two) times daily with a meal.     midodrine  (PROAMATINE ) 5 MG tablet Take 1 tablet (5 mg total) by mouth 3 (three) times daily with meals.     ondansetron  (ZOFRAN ) 4 MG tablet Take 1 tablet (4 mg total) by mouth every 6 (six) hours as needed for nausea.     predniSONE  (DELTASONE ) 20 MG tablet Take 3 tablets (60 mg total) by mouth daily with breakfast. Further refill / taper instructions per oncology (Patient taking differently: Take 40 mg by mouth daily with breakfast. Further refill / taper instructions per oncology)     saxagliptin HCl (ONGLYZA) 5 MG TABS tablet Take 5 mg by mouth daily.     Zinc  220 (50 Zn) MG CAPS Take 220 mg by mouth daily.     No  current facility-administered medications for this visit.    PHYSICAL EXAMINATION:   Vitals:   08/30/24 0924  BP: 103/63  Pulse: 97  Resp: 18  Temp: 98.2 F (36.8 C)  SpO2: 99%   Filed Weights    Physical Exam Vitals and nursing note reviewed.  HENT:     Head: Normocephalic and atraumatic.     Mouth/Throat:     Pharynx: Oropharynx is clear.  Eyes:     Extraocular Movements: Extraocular movements intact.     Pupils: Pupils are equal, round, and reactive to light.  Cardiovascular:     Rate and Rhythm: Normal rate and regular rhythm.  Pulmonary:     Comments: Decreased breath sounds bilaterally.  Abdominal:     Palpations: Abdomen is soft.  Musculoskeletal:        General: Normal range of motion.     Cervical back: Normal range of motion.  Skin:    General: Skin is warm.  Neurological:     General: No focal deficit present.     Mental Status: He is alert and oriented to person, place, and time.  Psychiatric:        Behavior: Behavior normal.        Judgment: Judgment normal.     LABORATORY DATA:  I have reviewed the data as listed Lab Results  Component Value Date   WBC 4.3 08/30/2024   HGB 8.8 (L) 08/30/2024   HCT 26.7 (L) 08/30/2024   MCV 90.5 08/30/2024   PLT 254 08/30/2024   Recent Labs    07/24/24 1533 07/25/24 0520 08/01/24 0429 08/07/24 0504 08/11/24 0352 08/16/24 0849 08/30/24 0926  NA 140   < > 135  --  131* 139 136  K 4.5   < > 4.5  --  4.1 4.5 4.5  CL 109   < > 103  --  101 103 99  CO2  19*   < > 22  --  18* 24 25  GLUCOSE 71   < > 117*  --  154* 225* 192*  BUN 20   < > 11  --  25* 24* 11  CREATININE 0.91   < > 0.73   < > 0.89 0.93 0.86  CALCIUM  8.4*   < > 8.1*  --  8.6* 8.6* 8.7*  GFRNONAA >60   < > >60   < > >60 >60 >60  PROT 6.5  --   --   --   --  7.8 7.0  ALBUMIN 3.3*   < > 2.8*  --   --  3.6 3.9  AST 37  --   --   --   --  16 13*  ALT 12  --   --   --   --  10 11  ALKPHOS 133*  --   --   --   --  127* 103  BILITOT 0.2  --    --   --   --  0.6 0.5   < > = values in this interval not displayed.    RADIOGRAPHIC STUDIES: I have personally reviewed the radiological images as listed and agreed with the findings in the report. CT BONE MARROW BIOPSY & ASPIRATION Result Date: 08/02/2024 INDICATION: [id number] Neutropenia, unspecified [id number] EXAM: CT GUIDED BONE MARROW ASPIRATION AND CORE BIOPSY MEDICATIONS: None. ANESTHESIA/SEDATION: Moderate (conscious) sedation was employed during this procedure. A total of Versed  2 mg and Fentanyl  100 mcg was administered intravenously. Moderate Sedation Time: 15 minutes. The patient's level of consciousness and vital signs were monitored continuously by radiology nursing throughout the procedure under my direct supervision. FLUOROSCOPY TIME:  CT dose in mGy was not provided. COMPLICATIONS: None immediate. Estimated blood loss: <5 mL PROCEDURE: RADIATION DOSE REDUCTION: This exam was performed according to the departmental dose-optimization program which includes automated exposure control, adjustment of the mA and/or kV according to patient size and/or use of iterative reconstruction technique. Informed written consent was obtained from the patient after a thorough discussion of the procedural risks, benefits and alternatives. All questions were addressed. Maximal Sterile Barrier Technique was utilized including caps, mask, sterile gowns, sterile gloves, sterile drape, hand hygiene and skin antiseptic. A timeout was performed prior to the initiation of the procedure. The patient was positioned prone and non-contrast localization CT was performed of the pelvis to demonstrate the iliac marrow spaces. Under sterile conditions and local anesthesia, an 11 gauge coaxial bone biopsy needle was advanced into the LEFT iliac marrow space. Needle position was confirmed with CT imaging. Initially, bone marrow aspiration was performed. Next, the 11 gauge outer cannula was utilized to obtain a 1 iliac bone marrow core  biopsy. Needle was removed. Hemostasis was obtained with compression. The patient tolerated the procedure well. Samples were prepared with the cytotechnologist. IMPRESSION: Successful CT-guided bone marrow aspiration and biopsy. Thom Hall, MD [facility] Electronically Signed   By: Thom Hall M.D.   On: 08/02/2024 11:38    Prostate cancer metastatic to multiple sites [facility]) # AUG 2025- [hospital]- Metastatic prostate cancer-castrate sensitive de novo; high volume-awaiting r Tempus. Bx  IR:  METASTIC PROSTATE ADENOCARCINOMA. 10/25-2025- [facility] Large volume, relatively diffuse prostatic primary with pelvic nodal and diffuse osseous metastasis.  # on Firmagon  q 4 weeks.s/p radiatio oncology re: prostate cancer- mets to bone.  Poor candidate for chemotherapy; continue Firmagon  on a monthly basis.  Discontinued apalutamide  given possible  drug-induced agranulocytosis.  PSA coming down.  Once CBC stabilizes-consider alternative oral drug for prostate cancer like Nubeqa.  # Severe neutropenia/agranulocytosis-refractory to Granix - ?  Etiology-drug-induced-bone marrow biopsy-no obvious reason noted.  Parvo negative.  Discontinued apalutamide .  Status post IVIG 1 g/kg x 2; prednisone  100 mg a day-recommend taper prednisone  patient currently NOT getting Filastrim  at the facility. ANC- WNL-  HOLD Zarzio today-   # Anemia-likely secondary to anemia of chronic disease/underlying prostate cancer/ Chronic kidney disease stage IIIa- stable; LOW I sat-HOLD venofer -   # Multiple bone metastases-ongoing pain high risk for pathologic fracture.s/p  radiation oncology - improved.HOLD zometa sec to dentition.    # Chronic kidney disease stage IIIa- Stable   # Peripheral vascular disease: Needing wound care stable.    # Diabetes monitor closely on current therapy.  # ACP-recommend evaluation with Josh Borders today.   # DISPOSITION: # Today NO zarxio  #  follow up in 2 weeks- MD; labs- cbc/cmp;psa;CRP; Eligard; possible  zarxio  - Dr.B  Above plan of care was discussed with patient/family in detail.  My contact information was given to the patient/family.     Cindy JONELLE Joe, MD 08/30/2024 10:13 AM

## 2024-08-30 NOTE — Assessment & Plan Note (Addendum)
#   AUG 2025- [hospital]- Metastatic prostate cancer-castrate sensitive de novo; high volume-awaiting r Tempus. Bx  IR:  METASTIC PROSTATE ADENOCARCINOMA. 10/25-2025- [facility] Large volume, relatively diffuse prostatic primary with pelvic nodal and diffuse osseous metastasis.  # on Firmagon  q 4 weeks.s/p radiatio oncology re: prostate cancer- mets to bone.  Poor candidate for chemotherapy; continue Firmagon  on a monthly basis.  Discontinued apalutamide  given possible drug-induced agranulocytosis.  PSA coming down.  Once CBC stabilizes-consider alternative oral drug for prostate cancer like Nubeqa.  # Severe neutropenia/agranulocytosis-refractory to Granix - ?  Etiology-drug-induced-bone marrow biopsy-no obvious reason noted.  Parvo negative.  Discontinued apalutamide .  Status post IVIG 1 g/kg x 2; prednisone  100 mg a day-recommend taper prednisone  patient currently NOT getting Filastrim  at the facility. ANC- WNL-  HOLD Zarzio today-   # Anemia-likely secondary to anemia of chronic disease/underlying prostate cancer/ Chronic kidney disease stage IIIa- stable; LOW I sat-HOLD venofer - check CRP.   # Multiple bone metastases-ongoing pain high risk for pathologic fracture.s/p  radiation oncology - improved.HOLD zometa sec to dentition.    # Chronic kidney disease stage IIIa- Stable   # Peripheral vascular disease: Needing wound care stable.    # Diabetes monitor closely on current therapy.  # ACP-recommend evaluation with Josh Borders today.   # DISPOSITION: # Today NO zarxio  # follow up in 2 weeks- MD; labs- cbc/cmp;psa;CRP; Eligard; possible  zarxio  - Dr.B

## 2024-08-30 NOTE — Progress Notes (Signed)
 NO zarxio  today, per MD

## 2024-08-30 NOTE — Progress Notes (Signed)
 Pt would like to know if his WBC is increasing?

## 2024-09-02 ENCOUNTER — Telehealth: Payer: Self-pay | Admitting: *Deleted

## 2024-09-02 NOTE — Telephone Encounter (Addendum)
 Call returned and verfied using pt's full name and dob prior to discussing PHI.  Sister stated that she was calling to see if the cancer center is the one to have filled pt's diabetic medications. She stated that she had already figured it out and would notify pcp. She thanked me for calling her back.

## 2024-09-13 ENCOUNTER — Inpatient Hospital Stay: Attending: Internal Medicine

## 2024-09-13 ENCOUNTER — Inpatient Hospital Stay: Admitting: Internal Medicine

## 2024-09-13 ENCOUNTER — Telehealth: Payer: Self-pay | Admitting: Pharmacist

## 2024-09-13 ENCOUNTER — Inpatient Hospital Stay

## 2024-09-13 ENCOUNTER — Encounter: Payer: Self-pay | Admitting: Internal Medicine

## 2024-09-13 DIAGNOSIS — C61 Malignant neoplasm of prostate: Secondary | ICD-10-CM

## 2024-09-13 DIAGNOSIS — L97519 Non-pressure chronic ulcer of other part of right foot with unspecified severity: Secondary | ICD-10-CM | POA: Insufficient documentation

## 2024-09-13 DIAGNOSIS — E11621 Type 2 diabetes mellitus with foot ulcer: Secondary | ICD-10-CM | POA: Insufficient documentation

## 2024-09-13 DIAGNOSIS — R432 Parageusia: Secondary | ICD-10-CM | POA: Insufficient documentation

## 2024-09-13 DIAGNOSIS — R5383 Other fatigue: Secondary | ICD-10-CM | POA: Insufficient documentation

## 2024-09-13 DIAGNOSIS — I129 Hypertensive chronic kidney disease with stage 1 through stage 4 chronic kidney disease, or unspecified chronic kidney disease: Secondary | ICD-10-CM | POA: Diagnosis not present

## 2024-09-13 DIAGNOSIS — I739 Peripheral vascular disease, unspecified: Secondary | ICD-10-CM | POA: Diagnosis not present

## 2024-09-13 DIAGNOSIS — N1831 Chronic kidney disease, stage 3a: Secondary | ICD-10-CM | POA: Insufficient documentation

## 2024-09-13 DIAGNOSIS — C7951 Secondary malignant neoplasm of bone: Secondary | ICD-10-CM | POA: Insufficient documentation

## 2024-09-13 DIAGNOSIS — E1122 Type 2 diabetes mellitus with diabetic chronic kidney disease: Secondary | ICD-10-CM | POA: Diagnosis not present

## 2024-09-13 LAB — CBC WITH DIFFERENTIAL (CANCER CENTER ONLY)
Abs Immature Granulocytes: 0.03 K/uL (ref 0.00–0.07)
Basophils Absolute: 0 K/uL (ref 0.0–0.1)
Basophils Relative: 0 %
Eosinophils Absolute: 0 K/uL (ref 0.0–0.5)
Eosinophils Relative: 0 %
HCT: 27.3 % — ABNORMAL LOW (ref 39.0–52.0)
Hemoglobin: 8.9 g/dL — ABNORMAL LOW (ref 13.0–17.0)
Immature Granulocytes: 1 %
Lymphocytes Relative: 5 %
Lymphs Abs: 0.2 K/uL — ABNORMAL LOW (ref 0.7–4.0)
MCH: 30 pg (ref 26.0–34.0)
MCHC: 32.6 g/dL (ref 30.0–36.0)
MCV: 91.9 fL (ref 80.0–100.0)
Monocytes Absolute: 0.5 K/uL (ref 0.1–1.0)
Monocytes Relative: 10 %
Neutro Abs: 4.4 K/uL (ref 1.7–7.7)
Neutrophils Relative %: 84 %
Platelet Count: 264 K/uL (ref 150–400)
RBC: 2.97 MIL/uL — ABNORMAL LOW (ref 4.22–5.81)
RDW: 19.1 % — ABNORMAL HIGH (ref 11.5–15.5)
WBC Count: 5.2 K/uL (ref 4.0–10.5)
nRBC: 0 % (ref 0.0–0.2)

## 2024-09-13 LAB — CMP (CANCER CENTER ONLY)
ALT: 6 U/L (ref 0–44)
AST: 10 U/L — ABNORMAL LOW (ref 15–41)
Albumin: 3.6 g/dL (ref 3.5–5.0)
Alkaline Phosphatase: 102 U/L (ref 38–126)
Anion gap: 10 (ref 5–15)
BUN: 22 mg/dL (ref 8–23)
CO2: 23 mmol/L (ref 22–32)
Calcium: 9.1 mg/dL (ref 8.9–10.3)
Chloride: 107 mmol/L (ref 98–111)
Creatinine: 0.79 mg/dL (ref 0.61–1.24)
GFR, Estimated: >60 mL/min
Glucose, Bld: 66 mg/dL — ABNORMAL LOW (ref 70–99)
Potassium: 4.4 mmol/L (ref 3.5–5.1)
Sodium: 140 mmol/L (ref 135–145)
Total Bilirubin: 0.3 mg/dL (ref 0.0–1.2)
Total Protein: 6.5 g/dL (ref 6.5–8.1)

## 2024-09-13 LAB — C-REACTIVE PROTEIN: CRP: 4.4 mg/dL — ABNORMAL HIGH

## 2024-09-13 LAB — PSA: Prostatic Specific Antigen: 134 ng/mL — ABNORMAL HIGH (ref 0.00–4.00)

## 2024-09-13 MED ORDER — ENZALUTAMIDE 40 MG PO TABS
160.0000 mg | ORAL_TABLET | Freq: Every day | ORAL | 2 refills | Status: AC
  Filled 2024-09-14: qty 120, 30d supply, fill #0
  Filled 2024-10-07: qty 120, 30d supply, fill #1

## 2024-09-13 MED ORDER — LEUPROLIDE ACETATE (6 MONTH) 45 MG ~~LOC~~ KIT
45.0000 mg | PACK | Freq: Once | SUBCUTANEOUS | Status: AC
  Administered 2024-09-13: 45 mg via SUBCUTANEOUS
  Filled 2024-09-13: qty 45

## 2024-09-13 NOTE — Progress Notes (Signed)
 [facility] CONSULT NOTE  Patient Care Team: Adina Buel HERO, MD as PCP - General (Family Medicine) [name], MD as Consulting Physician (Oncology) Lenn Aran, MD as Consulting Physician (Radiation Oncology)  CHIEF COMPLAINTS/PURPOSE OF CONSULTATION: Prostate cancer  Oncology History Overview Note  # AUG 2025- [hospital]- Metastatic prostate cancer-castrate sensitive de novo; high volume-awaiting r Tempus. Bx  IR:  METASTIC PROSTATE ADENOCARCINOMA  # on Firmagon  q 4 weeks. awaiting  radiatio oncology re: prostate cancer- mets to bone  # DEC 2025- Severe neutropenia/agranulocytosis-refractory to Granix - ?  Etiology-drug-induced-bone marrow biopsy-no obvious reason noted.  Parvo negative.  Discontinued apalutamide .  Status post IVIG 1 g/kg x 2; prednisone  100 mg a day-recommend taper prednisone .  #    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)  04/20/2024 Initial Diagnosis   Prostate cancer metastatic to multiple sites [facility])   05/10/2024 Cancer Staging   Staging form: Prostate, AJCC 8th Edition - Clinical: Stage IVB (cTX, cNX, pM1b) - Signed by Quay Simkin R, MD on 05/10/2024     Latest Reference Range & Units 04/17/24 02:36  Prostatic Specific Antigen 0.00 - 4.00 ng/mL 1,123.11 (H)  (H): Data is abnormally high  HISTORY OF PRESENTING ILLNESS: Patient ambulating- in in a wheelchair.  Accompanied by his sister Joby Richart 74 y.o.  male pleasant patient with  with metastatic castrate sensitive prostate cancer  high risk/high volume-on ADT chronic right foot ulcer, CKD IIIa, HTN, DM and anemia - is here for a follow up-currently on Firmagon  is here for follow-up.  Discussed the use of AI scribe software for clinical note transcription with the patient, who gave verbal consent to proceed.  History of Present Illness   [name] is a 74 year old male with metastatic prostate cancer who presents for oncology  follow-up.  He is currently receiving Firmagon  injections for metastatic prostate cancer. He was previously treated with apalutamide , which was discontinued due to suspected agranulocytosis. He has experienced fluctuating PSA levels, with recent laboratory studies showing gradual improvement in hematologic parameters. He was recently hospitalized and discharged from a rehabilitation facility, and is now residing at home with his sister.  He expresses ongoing confusion regarding his cancer medications, noting a change from four pills daily to two smaller pills, but is unable to specify the exact medication. There is uncertainty regarding which oral cancer agents he should continue, and he plans to bring all current medications to the clinic for review. His sister assists with medication management and confirms prescriptions were filled at [facility] following discharge from rehabilitation.  He reports ageusia and is unsure if this is related to his medications. He denies dyspnea and other acute symptoms.  He had not eaten prior to the visit, with a measured blood glucose of 66 mg/dL, and consumed two pieces of candy. He did not experience symptoms of hypoglycemia during the visit.      Review of Systems  Constitutional:  Positive for malaise/fatigue. Negative for chills, diaphoresis, fever and weight loss.  HENT:  Negative for nosebleeds and sore throat.   Eyes:  Negative for double vision.  Respiratory:  Negative for cough, hemoptysis, sputum production, shortness of breath and wheezing.   Cardiovascular:  Negative for chest pain, palpitations, orthopnea and leg swelling.  Gastrointestinal:  Negative for abdominal pain, blood in stool, constipation, diarrhea, heartburn, melena, nausea and vomiting.  Genitourinary:  Negative for dysuria, frequency and urgency.  Musculoskeletal:  Positive for back pain and joint pain.  Skin: Negative.  Negative for itching and rash.  Neurological:  Negative for  dizziness, tingling, focal weakness, weakness and headaches.  Endo/Heme/Allergies:  Does not bruise/bleed easily.  Psychiatric/Behavioral:  Negative for depression. The patient is not nervous/anxious and does not have insomnia.     MEDICAL HISTORY:  Past Medical History:  Diagnosis Date   Arthritis    Bladder incontinence    Chronic kidney disease    Stage 3 Kidney disease   Diabetes mellitus without complication (HCC)    DVT of leg (deep venous thrombosis) (HCC)    H/O RIGHT LEG 2018   Dyspnea    occassional   Hyperlipidemia    Hypertension    Peripheral vascular disease    Sepsis (HCC)     SURGICAL HISTORY: Past Surgical History:  Procedure Laterality Date   ABDOMINAL AORTOGRAM W/LOWER EXTREMITY N/A 10/10/2016   Procedure: Abdominal Aortogram w/Lower Extremity;  Surgeon: Cordella KANDICE Shawl, MD;  Location: [facility] INVASIVE [facility];  Service: Cardiovascular;  Laterality: N/A;   CATARACT EXTRACTION W/PHACO Left 05/27/2018   Procedure: CATARACT EXTRACTION PHACO AND INTRAOCULAR LENS PLACEMENT (IOC);  Surgeon: Myrna Adine Anes, MD;  Location: [facility] ORS;  Service: Ophthalmology;  Laterality: Left;  US  03:06.2 AP% 16.1 CDE 30.16 FLUID PACK LOT @ [id number] H   CATARACT EXTRACTION W/PHACO Right 07/13/2023   Procedure: CATARACT EXTRACTION PHACO AND INTRAOCULAR LENS PLACEMENT (IOC) RIGHT DIABETIC 4.00 00:36.6;  Surgeon: Myrna Adine Anes, MD;  Location: [facility];  Service: Ophthalmology;  Laterality: Right;   CIRCUMCISION  1998   COLONOSCOPY WITH PROPOFOL  N/A 08/20/2023   Procedure: COLONOSCOPY WITH PROPOFOL ;  Surgeon: Unk Corinn Skiff, MD;  Location: [facility] END[facility];  Service: Gastroenterology;  Laterality: N/A;   EYE SURGERY Left    Retina Detachment   GRAFT APPLICATION Right 05/29/2017   Procedure: GRAFT APPLICATION ( RIGHT FOOT );  Surgeon: Shawl Cordella KANDICE, MD;  Location: [facility] ORS;  Service: Vascular;  Laterality: Right;  graft taken from patients right thigh   IRRIGATION  AND DEBRIDEMENT FOOT Right 10/03/2016   Procedure: IRRIGATION AND DEBRIDEMENT FOOT;  Surgeon: Eva Gay, DPM;  Location: [facility] ORS;  Service: Podiatry;  Laterality: Right;   LOWER EXTREMITY ANGIOGRAPHY Right 02/24/2023   Procedure: Lower Extremity Angiography;  Surgeon: Shawl Cordella KANDICE, MD;  Location: [facility];  Service: Cardiovascular;  Laterality: Right;   LOWER EXTREMITY INTERVENTION  10/10/2016   Procedure: Lower Extremity Intervention;  Surgeon: Cordella KANDICE Shawl, MD;  Location: [facility];  Service: Cardiovascular;;   PERIPHERAL VASCULAR BALLOON ANGIOPLASTY Left 10/10/2016   Procedure: Peripheral Vascular Balloon Angioplasty;  Surgeon: Cordella KANDICE Shawl, MD;  Location: [facility];  Service: Cardiovascular;  Laterality: Left;   TONSILLECTOMY     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/2022   Procedure: TRANSMETATARSAL AMPUTATION;  Surgeon: Gay Eva, DPM;  Location: [facility] ORS;  Service: Podiatry;  Laterality: Right;   WOUND DEBRIDEMENT Right 11/07/2016   Procedure: DEBRIDEMENT OF WOUND AND BONE RIGHT FOOT AND APPLY WOUND VAC;  Surgeon: Eva Gay, DPM;  Location: [facility] OR[facility];  Service: Podiatry;  Laterality: Right;    SOCIAL HISTORY: Social History   Socioeconomic History   Marital status: Single    Spouse name: Not on file   Number of children: Not on file   Years of education: Not on file   Highest education level: Not on file  Occupational History   Not on file  Tobacco Use   Smoking status: Former    Types: Pipe, Cigarettes    Quit  date: 05/23/1975    Years since quitting: 49.3   Smokeless tobacco: Never  Vaping Use   Vaping status: Never Used  Substance and Sexual Activity   Alcohol use: No   Drug use: No   Sexual activity: Not on file  Other Topics Concern   Not on file  Social History Narrative   Not on file   Social Drivers of Health   Tobacco Use: Medium Risk (09/13/2024)   Patient History    Smoking Tobacco Use: Former     Smokeless Tobacco Use: Never    Passive Exposure: Not on file  Financial Resource Strain: Low Risk  (08/28/2023)   Received from [facility]   Overall Financial Resource Strain (CARDIA)    Difficulty of Paying Living Expenses: Not hard at all  Recent Concern: Financial Resource Strain - Medium Risk (08/06/2023)   Received from [facility]   Overall Financial Resource Strain (CARDIA)    Difficulty of Paying Living Expenses: Somewhat hard  Food Insecurity: No Food Insecurity (07/29/2024)   Epic    Worried About Running Out of Food in the Last Year: Never true    Ran Out of Food in the Last Year: Never true  Transportation Needs: No Transportation Needs (07/29/2024)   Epic    Lack of Transportation (Medical): No    Lack of Transportation (Non-Medical): No  Physical Activity: Inactive (08/28/2023)   Received from [facility]   Exercise Vital Sign    On average, how many days per week do you engage in moderate to strenuous exercise (like a brisk walk)?: 0 days    On average, how many minutes do you engage in exercise at this level?: 0 min  Stress: No Stress Concern Present (08/28/2023)   Received from [facility] - Occupational Stress Questionnaire    Feeling of Stress : Not at all  Social Connections: Moderately Integrated (07/29/2024)   Social Connection and Isolation Panel    Frequency of Communication with Friends and Family: Three times a week    Frequency of Social Gatherings with Friends and Family: Three times a week    Attends Religious Services: 1 to 4 times per year    Active Member of Clubs or Organizations: Yes    Attends Banker Meetings: 1 to 4 times per year    Marital Status: Never married  Intimate Partner Violence: Not At Risk (07/29/2024)   Epic    Fear of Current or Ex-Partner: No    Emotionally Abused: No    Physically Abused: No     Sexually Abused: No  Depression (PHQ2-9): Low Risk (09/13/2024)   Depression (PHQ2-9)    PHQ-2 Score: 0  Alcohol Screen: Not on file  Housing: High Risk (07/29/2024)   Epic    Unable to Pay for Housing in the Last Year: No    Number of Times Moved in the Last Year: 2    Homeless in the Last Year: No  Utilities: Not At Risk (07/29/2024)   Epic    Threatened with loss of utilities: No  Health Literacy: Adequate Health Literacy (08/28/2023)   Received from [facility]   B1300 Health Literacy    Frequency of need for help with medical instructions: Never    FAMILY HISTORY: Family History  Problem Relation Age of Onset   Diabetes Mother    Hypertension Mother    Diabetes Father  Hypertension Father    Cancer Father     ALLERGIES:  is allergic to sulfa antibiotics and piperacillin -tazobactam in dex.  MEDICATIONS:  Current Outpatient Medications  Medication Sig Dispense Refill   LORazepam  (ATIVAN ) 1 MG tablet Take 1 tablet (1 mg total) by mouth every 8 (eight) hours. 15 tablet 0   ondansetron  (ZOFRAN ) 4 MG tablet Take 1 tablet (4 mg total) by mouth every 6 (six) hours as needed for nausea.     acetaminophen  (TYLENOL ) 325 MG tablet Take 2 tablets (650 mg total) by mouth every 6 (six) hours as needed for mild pain (pain score 1-3) or fever (or Fever >/= 101).     ascorbic Acid (VITAMIN C) 500 MG CPCR Take 500 mg by mouth daily.     aspirin  EC 81 MG tablet Take 81 mg by mouth daily. Swallow whole.     atorvastatin  (LIPITOR) 10 MG tablet Take 10 mg by mouth every evening.     collagenase  (SANTYL ) 250 UNIT/GM ointment Apply topically daily. To wound / pressure ulcers     cyanocobalamin  1000 MCG tablet Take 1 tablet (1,000 mcg total) by mouth daily.     loperamide  (IMODIUM ) 2 MG capsule Take 2 capsules (4 mg total) by mouth every 8 (eight) hours as needed for diarrhea or loose stools.     metFORMIN  (GLUCOPHAGE ) 1000 MG tablet Take 1,000 mg by mouth 2 (two) times daily  with a meal.     midodrine  (PROAMATINE ) 5 MG tablet Take 1 tablet (5 mg total) by mouth 3 (three) times daily with meals.     predniSONE  (DELTASONE ) 20 MG tablet Take 3 tablets (60 mg total) by mouth daily with breakfast. Further refill / taper instructions per oncology (Patient taking differently: Take 40 mg by mouth daily with breakfast. Further refill / taper instructions per oncology)     saxagliptin HCl (ONGLYZA) 5 MG TABS tablet Take 5 mg by mouth daily. (Patient not taking: Reported on 09/13/2024)     Zinc  220 (50 Zn) MG CAPS Take 220 mg by mouth daily.     No current facility-administered medications for this visit.    PHYSICAL EXAMINATION:   Vitals:   09/13/24 0924  BP: 113/82  Pulse: 96  Resp: 18  Temp: (!) 96.9 F (36.1 C)  SpO2: 100%   Filed Weights    Physical Exam Vitals and nursing note reviewed.  HENT:     Head: Normocephalic and atraumatic.     Mouth/Throat:     Pharynx: Oropharynx is clear.  Eyes:     Extraocular Movements: Extraocular movements intact.     Pupils: Pupils are equal, round, and reactive to light.  Cardiovascular:     Rate and Rhythm: Normal rate and regular rhythm.  Pulmonary:     Comments: Decreased breath sounds bilaterally.  Abdominal:     Palpations: Abdomen is soft.  Musculoskeletal:        General: Normal range of motion.     Cervical back: Normal range of motion.  Skin:    General: Skin is warm.  Neurological:     General: No focal deficit present.     Mental Status: He is alert and oriented to person, place, and time.  Psychiatric:        Behavior: Behavior normal.        Judgment: Judgment normal.     LABORATORY DATA:  I have reviewed the data as listed Lab Results  Component Value Date   WBC 5.2 09/13/2024  HGB 8.9 (L) 09/13/2024   HCT 27.3 (L) 09/13/2024   MCV 91.9 09/13/2024   PLT 264 09/13/2024   Recent Labs    08/16/24 0849 08/30/24 0926 09/13/24 0928  NA 139 136 140  K 4.5 4.5 4.4  CL 103 99 107   CO2 24 25 23   GLUCOSE 225* 192* 66*  BUN 24* 11 22  CREATININE 0.93 0.86 0.79  CALCIUM  8.6* 8.7* 9.1  GFRNONAA >60 >60 >60  PROT 7.8 7.0 6.5  ALBUMIN 3.6 3.9 3.6  AST 16 13* 10*  ALT 10 11 6   ALKPHOS 127* 103 102  BILITOT 0.6 0.5 0.3    RADIOGRAPHIC STUDIES: I have personally reviewed the radiological images as listed and agreed with the findings in the report. No results found.   Prostate cancer metastatic to multiple sites [facility]) # AUG 2025- [hospital]- Metastatic prostate cancer-castrate sensitive de novo; high volume-awaiting r Tempus. Bx  IR:  METASTIC PROSTATE ADENOCARCINOMA. 10/25-2025- [facility] Large volume, relatively diffuse prostatic primary with pelvic nodal and diffuse osseous metastasis.  # on Firmagon  q 4 weeks.s/p radiatio oncology re: prostate cancer- mets to bone.  Poor candidate for chemotherapy; Switch to Eligard  q 6 M. Discontinued apalutamide  given possible drug-induced agranulocytosis.  PSA rising- . Since blood have stabilized will -consider alternative oral drug for prostate cancer like Nubeqa.discussed with pharmacy.   # Severe neutropenia/agranulocytosis-refractory to Granix - ?  Etiology-drug-induced-bone marrow biopsy-no obvious reason noted.  Parvo negative.  Discontinued apalutamide .  Status post IVIG 1 g/kg x 2; status post taper of prednisone . ANC- WNL-  HOLD Zarzio today-   # Anemia-likely secondary to anemia of chronic disease/underlying prostate cancer/ Chronic kidney disease stage IIIa- stable; LOW I sat-HOLD venofer - check CRP.   # Multiple bone metastases-ongoing pain high risk for pathologic fracture.s/p  radiation oncology - improved.HOLD zometa sec to dentition.    # Chronic kidney disease stage IIIa- Stable   # Peripheral vascular disease: Needing wound care stable.    # Diabetes monitor closely on current therapy.  # ACP- s/p evaluation with Josh Borders today.   # DISPOSITION: # Today NO zarxio ; Eligard  today-  # follow up in 4 weeks-  MD; labs- cbc/cmp;psa;CRP- possible zarxio  Dr.B   Above plan of care was discussed with patient/family in detail.  My contact information was given to the patient/family.     Cindy JONELLE Joe, MD 09/13/2024 12:18 PM

## 2024-09-13 NOTE — Progress Notes (Signed)
 Today NO zarxio ; Eligard  today-

## 2024-09-13 NOTE — Telephone Encounter (Signed)
 Clinical Pharmacist Practitioner Encounter  New authorization  Received notification from [facility] that prior authorization for Xtandi  is required.   PA submitted on CMM Key [id number] Status is pending   Oral Oncology Clinic will continue to follow.   Kellen Dutch N. Shondrika Hoque, PharmD, BCOP, CPP Hematology/Oncology Clinical Pharmacist [facility] Oral Chemotherapy Navigation Clinic [phone]  09/13/2024 1:53 PM

## 2024-09-13 NOTE — Patient Instructions (Signed)
 DISCONTINUE ERLEDA-

## 2024-09-13 NOTE — Telephone Encounter (Addendum)
 Oral Oncology Pharmacist Encounter  Received new prescription for Xtandi  (enzalutamide ) for the treatment of metastatic castration sensitive prostate cancer in conjunction with Firmagon  (degarelix ), planned duration until disease progression or unacceptable toxicity.  Patient was on Erleada  (apalutamide ) but developed agranulocytosis and discussed with MD about options for alternative therapy. MD was initially considering Nubeqa, but due to its increased risk of agranulocytosis, Xtandi  (enzalutamide ) was the preferred option per MD.   Labs from 09/13/2024 (CBC, CrCl > 60 mL/min, and CMP) assessed, no interventions needed. Prescription dose and frequency assessed for appropriateness.   Current medication list in Epic reviewed, DDIs with Xtandi  identified and listed below; Xtandi  has the potential to reduce concentrations of CYP3A4 substrates, monitor for decreased effectiveness of these medications and no dose adjustments required required at this time.  - Atorvastatin  - Ondansetron  - Prednisone  - Saxagliptin   Evaluated chart and no patient barriers to medication adherence noted.   Prescription has been e-scribed to the [facility] for benefits analysis and approval.  Oral Oncology Clinic will continue to follow for insurance authorization, copayment issues, initial counseling and start date.  Damien Henry, M.S. PharmD Candidate Class of 2026 [employer]  09/13/2024 12:01 PM Oral Oncology Clinic [phone]

## 2024-09-13 NOTE — Progress Notes (Signed)
 Pt states he was d/c from [place] last Friday, living with sister.   Unable to relay meds. I mentioned bringing meds next visit.  C/o  Appetite very small, food has no taste. He is not drinking any supplement drinks.

## 2024-09-13 NOTE — Assessment & Plan Note (Addendum)
#   AUG 2025- [hospital]- Metastatic prostate cancer-castrate sensitive de novo; high volume-awaiting r Tempus. Bx  IR:  METASTIC PROSTATE ADENOCARCINOMA. 10/25-2025- [facility] Large volume, relatively diffuse prostatic primary with pelvic nodal and diffuse osseous metastasis.  # on Firmagon  q 4 weeks.s/p radiatio oncology re: prostate cancer- mets to bone.  Poor candidate for chemotherapy; Switch to Eligard  q 6 M. Discontinued apalutamide  given possible drug-induced agranulocytosis.  PSA rising- . Since blood have stabilized will -consider alternative oral drug for prostate cancer like Nubeqa.discussed with pharmacy.   # Severe neutropenia/agranulocytosis-refractory to Granix - ?  Etiology-drug-induced-bone marrow biopsy-no obvious reason noted.  Parvo negative.  Discontinued apalutamide .  Status post IVIG 1 g/kg x 2; status post taper of prednisone . ANC- WNL-  HOLD Zarzio today-   # Anemia-likely secondary to anemia of chronic disease/underlying prostate cancer/ Chronic kidney disease stage IIIa- stable; LOW I sat-HOLD venofer - check CRP.   # Multiple bone metastases-ongoing pain high risk for pathologic fracture.s/p  radiation oncology - improved.HOLD zometa sec to dentition.    # Chronic kidney disease stage IIIa- Stable   # Peripheral vascular disease: Needing wound care stable.    # Diabetes monitor closely on current therapy.  # ACP- s/p evaluation with Josh Borders today.   # DISPOSITION: # Today NO zarxio ; Eligard  today-  # follow up in 4 weeks- MD; labs- cbc/cmp;psa;CRP- possible zarxio  Dr.B

## 2024-09-13 NOTE — Telephone Encounter (Signed)
 Clinical Pharmacist Practitioner Encounter   New authorization  Prior Authorization for Xtandi  has been approved.     PA# [id number] Effective dates: 09/01/2024 through 08/31/2025   Oral Oncology Clinic will continue to follow.   Kealan Buchan N. Champion Corales, PharmD, BCOP, CPP Hematology/Oncology Clinical Pharmacist [facility] Oral Chemotherapy Navigation Clinic [phone]  09/13/2024 1:54 PM

## 2024-09-14 ENCOUNTER — Telehealth: Payer: Self-pay | Admitting: Pharmacy Technician

## 2024-09-14 ENCOUNTER — Other Ambulatory Visit: Payer: Self-pay

## 2024-09-14 ENCOUNTER — Other Ambulatory Visit: Payer: Self-pay | Admitting: Pharmacy Technician

## 2024-09-14 ENCOUNTER — Other Ambulatory Visit (HOSPITAL_COMMUNITY): Payer: Self-pay

## 2024-09-14 DIAGNOSIS — C61 Malignant neoplasm of prostate: Secondary | ICD-10-CM

## 2024-09-14 NOTE — Telephone Encounter (Signed)
 Oral Oncology Patient Advocate Encounter  Patient successfully OnBoarded and drug education provided by pharmacist. Medication scheduled to be shipped on 01/15 for delivery on 01/16 from [facility] to patient's address. Patient also knows to call me at [phone] with any questions or concerns regarding receiving medication or if there is any unexpected change in co-pay.    Jushua Waltman ([name]) [name], CPhT  [facility], [name], [facility]/Oncology - Oral Chemotherapy Patient Advocate Specialist III Phone: [phone]  Fax: [phone]

## 2024-09-14 NOTE — Patient Instructions (Signed)
 [facility] - A DEPT OF [facility]    Thank you for choosing [facility] to provide your oncology/hematology care and for allowing us  to participate in your care today!  As a reminder, we spoke about the following today: Xtandi  (enzalutamide ) for the treatment of metastatic castration sensitive prostate cancer in conjunction with Firmagon  (degarelix ), planned duration until disease progression or unacceptable toxicity.   Treatment goal: Palliative  Medication handout has been provided.   **For oral cancer medication prescription refill requests, contact your pharmacy and they will contact our office if needed. Allow 5-7 days for refills to be completed by your specialty pharmacy.    Cancer Center General Instructions:  If you have an appointment at the [facility], please go directly to the Cancer Center and check in at the registration area.  We strive to give you quality time with your provider. You may need to reschedule your appointment if you arrive late (15 or more minutes).  Arriving late affects you and other patients whose appointments are after yours.  Also, if you miss three or more appointments without notifying the office, you may be dismissed from the clinic at the provider's discretion.      BELOW ARE SYMPTOMS THAT SHOULD BE REPORTED IMMEDIATELY: *FEVER GREATER THAN 100.4 F (38 C) OR HIGHER *CHILLS OR SWEATING *NAUSEA AND VOMITING THAT IS NOT CONTROLLED WITH YOUR NAUSEA MEDICATION *UNUSUAL SHORTNESS OF BREATH *UNUSUAL BRUISING OR BLEEDING *URINARY PROBLEMS (pain or burning when urinating, or frequent urination) *BOWEL PROBLEMS (unusual diarrhea, constipation, pain near the anus) TENDERNESS IN MOUTH AND THROAT WITH OR WITHOUT PRESENCE OF ULCERS (sore throat, sores in mouth, or a toothache) UNUSUAL RASH, SWELLING OR PAIN  UNUSUAL VAGINAL DISCHARGE OR ITCHING   Items with * indicate a potential emergency and should be  followed up as soon as possible or go to the Emergency Department if any problems should occur.  Please show the CHEMOTHERAPY ALERT CARD at check-in to the Emergency Department and triage nurse.  Should you have questions after your visit or need to cancel or reschedule your appointment, please contact [facility] - A DEPT OF [facility]  [phone] and follow the prompts.  Office hours are 8:00 a.m. to 4:30 p.m. Monday - Friday. Please note that voicemails left after 4:00 p.m. may not be returned until the following business day.  We are closed weekends and major holidays. You have access to a nurse at all times for urgent questions. Please call the main number to the clinic [phone] and follow the prompts.  For any non-urgent questions, you may also contact your provider using MyChart. We now offer e-Visits for anyone 48 and older to request care online for non-urgent symptoms. For details visit [email or web address].   Also download the MyChart app! Go to the app store, search MyChart, open the app, select [place], and log in with your MyChart username and password.

## 2024-09-14 NOTE — Progress Notes (Signed)
 Specialty Pharmacy Initial Fill Coordination Note  [name] is a 74 y.o. male contacted today regarding initial fill of specialty medication(s) Enzalutamide  (XTANDI )   Patient requested Delivery   Delivery date: 09/16/24   Verified address: [address]  [place] [address]   Medication will be filled on: 09/15/24   Patient is aware of $0 copayment. Leisure centre manager.  [name] ([name]) [name], CPhT  [facility], [name], [facility] - Oral Chemotherapy Patient Advocate Specialist III Phone: [phone]  Fax: [phone]

## 2024-09-14 NOTE — Progress Notes (Signed)
 PGY2 Oncology Pharmacy Resident Encounter - Oral Chemo Initial Patient Education  I spoke with patient and their sister by phone for overview of new oral chemotherapy medication: Xtandi  (enzalutamide ) for the treatment of metastatic castration sensitive prostate cancer in conjunction with Firmagon  (degarelix ), planned duration until disease progression or unacceptable toxicity.   Planned Start Date: Patient and patient sister understand they can start their medication once they receive it from [facility]. Medication scheduled to be shipped on 1/15 and delivered on 1/16.  Treatment regimen: Take 4 tablets (160 mg) by mouth daily.  Treatment goal: Palliative  Patient Education Counseled patient on administration, dosing, side effects, monitoring, drug-food interactions, safe handling, storage, and disposal. Reviewed with patient importance of keeping a medication schedule and plan for any missed doses. Relevant drug-drug interactions were discussed:  Discussed this medication can reduce the effectiveness of their statin and glucose control medications. Recommended that the patient let their provider know of this potential and to monitor labs for increased cholesterol or reduced glucose control. No immediate action required.  Patient's sister reported today is the last day of their prednisone  taper.  Advise effects include but are not limited to: Increased fatigue:  Patient and patient's sister understands to be active as much as possible but to rest when needed. They can let their provider know if their fatigue negative impacts their normal routine.  Change in lab and glucose levels:  Let the patient and patient sister know this medication can impact their blood sugars and can reduce the how well their DPP-4 inhibitors medication works. Patient's sister responded they weren't sure if they were to be given linagliptin  or saxaglitpin. At the moment they haven't received these medications  since [facility] was assessing cost. Informed [facility] this medication can reduce the efficacy of these medications.  Labs will be monitored by their providers for any changes   Patient Understanding and Adherence After discussion with patient no patient barriers to medication adherence identified.  Patient voiced understanding and appreciation.  All questions answered. Medication handout mailed.  Communication and Learning Assessment Primary learner: Patient and patient's sister Barriers to learning: No barriers Preferred language: English Learning preferences: Listening Reading  Evaluation of Patient Distress Distress thermometer not completed during telephone call as patient has been on previous lines of therapy.   Follow-up and Contact Patient and patient's sister knows when and how to call the office with questions or concerns about non-urgent side effects.  Provided patient with Oral Chemotherapy Navigation Clinic phone number.  Oral Chemotherapy Navigation Clinic will continue to follow.  Thank you for allowing pharmacy to participate in this patient's care.  Alfonso MARLA Buys, PharmD Pharmacy Resident  09/14/2024 12:34 PM

## 2024-09-14 NOTE — Progress Notes (Signed)
 Patient education documented in EPIC note on 09/14/24.

## 2024-09-15 ENCOUNTER — Other Ambulatory Visit: Payer: Self-pay

## 2024-09-21 ENCOUNTER — Other Ambulatory Visit: Payer: Self-pay

## 2024-09-21 ENCOUNTER — Encounter: Payer: Self-pay | Admitting: Pulmonary Disease

## 2024-09-21 ENCOUNTER — Inpatient Hospital Stay
Admission: EM | Admit: 2024-09-21 | Discharge: 2024-10-03 | Disposition: A | Discharge status: Home / Self Care | Source: Ambulatory Visit | Attending: Student in an Organized Health Care Education/Training Program | Admitting: Student in an Organized Health Care Education/Training Program

## 2024-09-21 DIAGNOSIS — E11649 Type 2 diabetes mellitus with hypoglycemia without coma: Secondary | ICD-10-CM | POA: Diagnosis not present

## 2024-09-21 DIAGNOSIS — R6521 Severe sepsis with septic shock: Secondary | ICD-10-CM | POA: Diagnosis not present

## 2024-09-21 DIAGNOSIS — A419 Sepsis, unspecified organism: Secondary | ICD-10-CM | POA: Diagnosis not present

## 2024-09-21 DIAGNOSIS — G9341 Metabolic encephalopathy: Secondary | ICD-10-CM

## 2024-09-21 DIAGNOSIS — N179 Acute kidney failure, unspecified: Secondary | ICD-10-CM | POA: Diagnosis present

## 2024-09-21 DIAGNOSIS — L97409 Non-pressure chronic ulcer of unspecified heel and midfoot with unspecified severity: Secondary | ICD-10-CM | POA: Diagnosis present

## 2024-09-21 DIAGNOSIS — J189 Pneumonia, unspecified organism: Secondary | ICD-10-CM

## 2024-09-21 DIAGNOSIS — N183 Chronic kidney disease, stage 3 unspecified: Secondary | ICD-10-CM | POA: Diagnosis not present

## 2024-09-21 DIAGNOSIS — T68XXXA Hypothermia, initial encounter: Secondary | ICD-10-CM

## 2024-09-21 DIAGNOSIS — I959 Hypotension, unspecified: Secondary | ICD-10-CM

## 2024-09-21 DIAGNOSIS — E875 Hyperkalemia: Secondary | ICD-10-CM

## 2024-09-21 DIAGNOSIS — L089 Local infection of the skin and subcutaneous tissue, unspecified: Secondary | ICD-10-CM | POA: Diagnosis present

## 2024-09-21 DIAGNOSIS — L8990 Pressure ulcer of unspecified site, unspecified stage: Secondary | ICD-10-CM | POA: Insufficient documentation

## 2024-09-21 DIAGNOSIS — D638 Anemia in other chronic diseases classified elsewhere: Secondary | ICD-10-CM | POA: Diagnosis present

## 2024-09-21 DIAGNOSIS — E872 Acidosis, unspecified: Principal | ICD-10-CM

## 2024-09-21 DIAGNOSIS — T796XXA Traumatic ischemia of muscle, initial encounter: Secondary | ICD-10-CM | POA: Insufficient documentation

## 2024-09-21 DIAGNOSIS — I739 Peripheral vascular disease, unspecified: Secondary | ICD-10-CM | POA: Diagnosis present

## 2024-09-21 DIAGNOSIS — R7401 Elevation of levels of liver transaminase levels: Secondary | ICD-10-CM | POA: Diagnosis not present

## 2024-09-21 DIAGNOSIS — M6282 Rhabdomyolysis: Secondary | ICD-10-CM | POA: Diagnosis not present

## 2024-09-21 LAB — PROTIME-INR
INR: 1.3 — ABNORMAL HIGH (ref 0.8–1.2)
Prothrombin Time: 16.6 s — ABNORMAL HIGH (ref 11.4–15.2)

## 2024-09-21 LAB — CBC WITH DIFFERENTIAL/PLATELET
Abs Immature Granulocytes: 0.03 K/uL (ref 0.00–0.07)
Basophils Absolute: 0 K/uL (ref 0.0–0.1)
Basophils Relative: 0 %
Eosinophils Absolute: 0 K/uL (ref 0.0–0.5)
Eosinophils Relative: 0 %
HCT: 30.6 % — ABNORMAL LOW (ref 39.0–52.0)
Hemoglobin: 9.7 g/dL — ABNORMAL LOW (ref 13.0–17.0)
Immature Granulocytes: 0 %
Lymphocytes Relative: 2 %
Lymphs Abs: 0.2 K/uL — ABNORMAL LOW (ref 0.7–4.0)
MCH: 29.8 pg (ref 26.0–34.0)
MCHC: 31.7 g/dL (ref 30.0–36.0)
MCV: 93.9 fL (ref 80.0–100.0)
Monocytes Absolute: 0.5 K/uL (ref 0.1–1.0)
Monocytes Relative: 5 %
Neutro Abs: 8.9 K/uL — ABNORMAL HIGH (ref 1.7–7.7)
Neutrophils Relative %: 93 %
Platelets: 319 K/uL (ref 150–400)
RBC: 3.26 MIL/uL — ABNORMAL LOW (ref 4.22–5.81)
RDW: 19.1 % — ABNORMAL HIGH (ref 11.5–15.5)
WBC: 9.6 K/uL (ref 4.0–10.5)
nRBC: 0 % (ref 0.0–0.2)

## 2024-09-21 LAB — URINALYSIS, W/ REFLEX TO CULTURE (INFECTION SUSPECTED)
Bilirubin Urine: NEGATIVE
Glucose, UA: NEGATIVE mg/dL
Ketones, ur: NEGATIVE mg/dL
Nitrite: NEGATIVE
Protein, ur: >=300 mg/dL — AB
Specific Gravity, Urine: 1.027 (ref 1.005–1.030)
pH: 5 (ref 5.0–8.0)

## 2024-09-21 LAB — CBG MONITORING, ED
Glucose-Capillary: 115 mg/dL — ABNORMAL HIGH (ref 70–99)
Glucose-Capillary: 48 mg/dL — ABNORMAL LOW (ref 70–99)
Glucose-Capillary: 119 mg/dL — ABNORMAL HIGH (ref 70–99)
Glucose-Capillary: 101 mg/dL — ABNORMAL HIGH (ref 70–99)

## 2024-09-21 LAB — COMPREHENSIVE METABOLIC PANEL WITH GFR
ALT: 22 U/L (ref 0–44)
AST: 91 U/L — ABNORMAL HIGH (ref 15–41)
Albumin: 3.3 g/dL — ABNORMAL LOW (ref 3.5–5.0)
Alkaline Phosphatase: 136 U/L — ABNORMAL HIGH (ref 38–126)
Anion gap: 26 — ABNORMAL HIGH (ref 5–15)
BUN: 64 mg/dL — ABNORMAL HIGH (ref 8–23)
CO2: 11 mmol/L — ABNORMAL LOW (ref 22–32)
Calcium: 8.5 mg/dL — ABNORMAL LOW (ref 8.9–10.3)
Chloride: 105 mmol/L (ref 98–111)
Creatinine, Ser: 2.65 mg/dL — ABNORMAL HIGH (ref 0.61–1.24)
GFR, Estimated: 25 mL/min — ABNORMAL LOW
Glucose, Bld: 59 mg/dL — ABNORMAL LOW (ref 70–99)
Potassium: 5.3 mmol/L — ABNORMAL HIGH (ref 3.5–5.1)
Sodium: 142 mmol/L (ref 135–145)
Total Bilirubin: 0.2 mg/dL (ref 0.0–1.2)
Total Protein: 5.9 g/dL — ABNORMAL LOW (ref 6.5–8.1)

## 2024-09-21 LAB — BLOOD GAS, VENOUS
Acid-base deficit: 17.8 mmol/L — ABNORMAL HIGH (ref 0.0–2.0)
Bicarbonate: 9.3 mmol/L — ABNORMAL LOW (ref 20.0–28.0)
O2 Saturation: 87.4 %
Patient temperature: 37
pCO2, Ven: 26 mmHg — ABNORMAL LOW (ref 44–60)
pH, Ven: 7.16 — CL (ref 7.25–7.43)
pO2, Ven: 58 mmHg — ABNORMAL HIGH (ref 32–45)

## 2024-09-21 LAB — LACTIC ACID, PLASMA: Lactic Acid, Venous: >9 mmol/L (ref 0.5–1.9)

## 2024-09-21 MED ORDER — SENNA 8.6 MG PO TABS: 1.0000 | ORAL_TABLET | Freq: Two times a day (BID) | ORAL | Status: DC | PRN

## 2024-09-21 MED ORDER — CHLORHEXIDINE GLUCONATE CLOTH 2 % EX PADS
6.0000 | MEDICATED_PAD | Freq: Every day | CUTANEOUS | Status: DC
  Administered 2024-09-21: 6 via TOPICAL
  Filled 2024-09-21: qty 6

## 2024-09-21 MED ORDER — LACTATED RINGERS IV BOLUS
1000.0000 mL | Freq: Once | INTRAVENOUS | Status: AC
  Administered 2024-09-21: 1000 mL via INTRAVENOUS

## 2024-09-21 MED ORDER — SODIUM CHLORIDE 0.9 % IV SOLN
2.0000 g | Freq: Two times a day (BID) | INTRAVENOUS | Status: DC
  Administered 2024-09-22: 2 g via INTRAVENOUS
  Filled 2024-09-21 (×2): qty 12.5

## 2024-09-21 MED ORDER — SODIUM CHLORIDE 0.9 % IV SOLN
2.0000 g | Freq: Once | INTRAVENOUS | Status: AC
  Administered 2024-09-21: 2 g via INTRAVENOUS
  Filled 2024-09-21: qty 12.5

## 2024-09-21 MED ORDER — VASOPRESSIN 20 UNITS/100 ML INFUSION FOR SHOCK
0.0000 [IU]/min | INTRAVENOUS | Status: DC
  Administered 2024-09-22 – 2024-09-24 (×5): 0.03 [IU]/min via INTRAVENOUS
  Filled 2024-09-21 (×6): qty 100

## 2024-09-21 MED ORDER — METRONIDAZOLE 500 MG/100ML IV SOLN
500.0000 mg | Freq: Two times a day (BID) | INTRAVENOUS | Status: DC
  Administered 2024-09-21: 500 mg via INTRAVENOUS
  Filled 2024-09-21 (×2): qty 100

## 2024-09-21 MED ORDER — SODIUM BICARBONATE 8.4 % IV SOLN
100.0000 meq | Freq: Once | INTRAVENOUS | Status: AC
  Administered 2024-09-21: 100 meq via INTRAVENOUS
  Filled 2024-09-21: qty 50

## 2024-09-21 MED ORDER — NOREPINEPHRINE 4 MG/250ML-% IV SOLN
0.0000 ug/min | INTRAVENOUS | Status: DC
  Administered 2024-09-21: 2 ug/min via INTRAVENOUS
  Administered 2024-09-22: 22 ug/min via INTRAVENOUS
  Administered 2024-09-22: 20 ug/min via INTRAVENOUS
  Filled 2024-09-21 (×4): qty 250

## 2024-09-21 MED ORDER — MIDODRINE HCL 5 MG PO TABS
5.0000 mg | ORAL_TABLET | Freq: Once | ORAL | Status: AC
  Administered 2024-09-21: 5 mg via ORAL
  Filled 2024-09-21: qty 1

## 2024-09-21 MED ORDER — DEXTROSE 50 % IV SOLN
1.0000 | Freq: Once | INTRAVENOUS | Status: AC
  Administered 2024-09-21: 50 mL via INTRAVENOUS

## 2024-09-21 MED ORDER — POLYETHYLENE GLYCOL 3350 17 G PO PACK
17.0000 g | PACK | Freq: Every day | ORAL | Status: DC | PRN
  Administered 2024-09-27: 17 g via ORAL
  Filled 2024-09-21: qty 1

## 2024-09-21 MED ORDER — VANCOMYCIN HCL IN DEXTROSE 1-5 GM/200ML-% IV SOLN
1000.0000 mg | Freq: Once | INTRAVENOUS | Status: AC
  Administered 2024-09-21: 1000 mg via INTRAVENOUS
  Filled 2024-09-21: qty 200

## 2024-09-21 MED ORDER — INSULIN ASPART 100 UNIT/ML IJ SOLN
0.0000 [IU] | INTRAMUSCULAR | Status: DC
  Administered 2024-09-22 (×2): 2 [IU] via SUBCUTANEOUS
  Administered 2024-09-22 (×2): 1 [IU] via SUBCUTANEOUS
  Administered 2024-09-22: 2 [IU] via SUBCUTANEOUS
  Administered 2024-09-22 – 2024-09-23 (×3): 1 [IU] via SUBCUTANEOUS
  Administered 2024-09-23: 2 [IU] via SUBCUTANEOUS
  Administered 2024-09-23: 1 [IU] via SUBCUTANEOUS
  Administered 2024-09-24: 2 [IU] via SUBCUTANEOUS
  Administered 2024-09-24: 3 [IU] via SUBCUTANEOUS
  Administered 2024-09-24: 2 [IU] via SUBCUTANEOUS
  Administered 2024-09-24: 3 [IU] via SUBCUTANEOUS
  Administered 2024-09-24: 2 [IU] via SUBCUTANEOUS
  Administered 2024-09-24: 1 [IU] via SUBCUTANEOUS
  Administered 2024-09-25 (×3): 2 [IU] via SUBCUTANEOUS
  Administered 2024-09-25: 1 [IU] via SUBCUTANEOUS
  Administered 2024-09-25: 2 [IU] via SUBCUTANEOUS
  Administered 2024-09-26 (×3): 1 [IU] via SUBCUTANEOUS
  Administered 2024-09-26: 3 [IU] via SUBCUTANEOUS
  Administered 2024-09-27: 2 [IU] via SUBCUTANEOUS
  Administered 2024-09-27: 1 [IU] via SUBCUTANEOUS
  Administered 2024-09-27: 2 [IU] via SUBCUTANEOUS
  Administered 2024-09-27: 3 [IU] via SUBCUTANEOUS
  Administered 2024-09-28: 1 [IU] via SUBCUTANEOUS
  Administered 2024-09-28: 2 [IU] via SUBCUTANEOUS
  Administered 2024-09-28: 1 [IU] via SUBCUTANEOUS
  Filled 2024-09-21 (×2): qty 1
  Filled 2024-09-21 (×2): qty 2
  Filled 2024-09-21: qty 1
  Filled 2024-09-21 (×2): qty 2
  Filled 2024-09-21: qty 3
  Filled 2024-09-21: qty 1
  Filled 2024-09-21: qty 2
  Filled 2024-09-21: qty 3
  Filled 2024-09-21 (×2): qty 1
  Filled 2024-09-21 (×2): qty 2
  Filled 2024-09-21: qty 3
  Filled 2024-09-21: qty 1
  Filled 2024-09-21: qty 2
  Filled 2024-09-21: qty 1
  Filled 2024-09-21: qty 3
  Filled 2024-09-21: qty 1
  Filled 2024-09-21 (×3): qty 2
  Filled 2024-09-21: qty 1
  Filled 2024-09-21 (×3): qty 2
  Filled 2024-09-21: qty 1
  Filled 2024-09-21: qty 2
  Filled 2024-09-21 (×2): qty 1

## 2024-09-21 MED ORDER — HEPARIN SODIUM (PORCINE) 5000 UNIT/ML IJ SOLN
5000.0000 [IU] | Freq: Three times a day (TID) | INTRAMUSCULAR | Status: DC
  Administered 2024-09-21 – 2024-09-28 (×21): 5000 [IU] via SUBCUTANEOUS
  Filled 2024-09-21 (×21): qty 1

## 2024-09-21 MED ORDER — SODIUM CHLORIDE 0.9 % IV BOLUS
1000.0000 mL | Freq: Once | INTRAVENOUS | Status: AC
  Administered 2024-09-21: 1000 mL via INTRAVENOUS

## 2024-09-21 NOTE — ED Notes (Signed)
 Pt is receiving warm fluids by the warmer and bair hugger is in place.

## 2024-09-21 NOTE — H&P (Signed)
 "  NAME:  [name], MRN:  [id number], DOB:  February 04, 1951, LOS: 0 ADMISSION DATE:  09/21/2024, CONSULTATION DATE:  09/21/24 REFERRING MD:  Dr. Floy, CHIEF COMPLAINT:  Hypotension   Brief Synopsis:  Year old male/male with a past medical history significant for ***, admitted for ***.  History of Present Illness:    ED course: ***.   Vitals on Arrival: Temp:  BP:  HR:  RR:  SpO2: on **  Pertinent Labs/Diagnostics: ***  I, Robet Kim, PA-C personally viewed and interpreted this ECG. EKG Interpretation: Date: ***, EKG Time: ***, Rate: ***, Rhythm: ***, QRS Axis:  *** Intervals: ***, ST/T Wave abnormalities: ***, Narrative Interpretation: ***  Chemistry:  Na+:*** K+: *** BUN/Cr.: *** Serum CO2/ AG: *** Glucose: ***  Hematology:  WBC: *** Hgb: *** Platelets *** INR:  Troponin: *** BNP: *** Lactic/ PCT: *** ABG: ***  ID: COVID-19 & Influenza A/B: *** UA:  MRSA PCR:  CXR ***: *** CT ***: ***  Medications Administered: *** IVF: *** ABX: *** Shifting measures: *** Vasopressor: ***  PCCM consulted for admission due to ***.   Pertinent  Medical History  ***  Significant Hospital Events: Including procedures, antibiotic start and stop dates in addition to other pertinent events     Interim History / Subjective:  ***  Objective    Blood pressure (!) 98/46, pulse 94, temperature (!) 95.4 F (35.2 C), resp. rate 20, weight 129.3 kg, SpO2 100%.        Intake/Output Summary (Last 24 hours) at 09/21/2024 2147 Last data filed at 09/21/2024 1825 Gross per 24 hour  Intake 2000 ml  Output --  Net 2000 ml   Filed Weights   09/21/24 2100  Weight: 129.3 kg    Examination: General: Adult ***, critically***acutely ill, lying in bed intubated & sedated requiring mechanical ventilation *** NAD HENT: anicteric***, atraumatic, normocephalic, neck supple, *** JVD CV: RRR, S1 S2, *** on monitor, no r/m/g Pulm: Regular, non labored on ***/  synchronous/dyssynchronous with vent , breath sounds *** GI: soft, ***, non***tender, non-distended ***, no ** rebound/guarding, bowel sounds x 4 Extremities: warm/dry, pulses + 2 R/P, *** edema noted Neuro: A&O x ***, does/not follow commands, *** focal neuro deficits, PERRL ***  GU: foley in place *** with clear yellow urine   Resolved problem list   Assessment and Plan   #Sepsis with septic shock due to suspected Urosepsis vs Pneumonia Hx: Hypertension, HLD, DVT 2018, Pressure ulcers Initial interventions/workup included: 2 L of NS + 1 LR  Initial ABX: Cefepime  + Vancomycin  - Continuous cardiac monitoring - Consider vasopressors to maintain MAP goal > 65 ~ currently on levophed  - Likely will add vasopressin  - Consider stress dose steroids if persistent: Hydrocortisone  50mg  q6h or 100q8 - Fluid resuscitation: consider continuous fluids depending on repeat lactic - Trend WBC and monitor fever curve - Cultures pending - Trend lactic/PCT - Lactic/PCT on admission: >9.0/ *** - IV ABX: Cefepime  + Flagyl  given reaction to Zosyn  in the past - MRSA PCR pending - UA with leukocytes, urine culture ordered - CXR: ***  #?CAP #Chronic Dyspnea - not requiring home O2 ECHO in 2018: LVEF 55-60% with normal systolic function 1/21 CXR: Stable left lung base atelectasis - Supplemental oxygen to maintain SpO2 >92% - Currently on room air  #AKI on CKD Stage 3 #AGMA + Lactic Acidosis #Hyperkalemia Hx: CKD Stage 3 Initial Shifting Measures: 1 amp D50 No home meds to include SGLT2i, less likely AGMA iso of Euglycemic DKA -  Strict I/O's: alert provider if UOP < 0.5 mL/kg/hr - Trend BMP, Mg and Phos - Ensure Mg >2 and Phos >4 - CK pending - Avoid nephrotoxins as able - Ensure adequate renal perfusion - Replace electrolytes as indicated - Consider Nephro consult if renal function worsens or severe electrolyte derangements are present  #Mild Transaminitis - Trend hepatic function -  AST/ALT: 91/22 - Alk Phos: 136 - Diet: NPO - Constipation protocol prn - Antiemetics if nausea present; check Qtc prior  - If LFTs continue to rise tomorrow, consider RUQ Ultrasound  #Anemia of Chronic Kidney Disease #Supratherapeutic INR - Trend CBC and monitor coags - Transfuse if HGB <7 or active bleeding - Monitor for signs and symptoms of bleeding - DVT Prophylaxis: SQ Heparin  - Home Anticoagulation:  #Type II Diabetes Mellitus #Hypoglycemia Initial Interventions: 1 amp D50 - ICU hypo/hyperglycemia protocol - CBG q4h - SSI: Novolog  as indicated - Goal Range: 140-180 - Diabetes Coordinator consult; appreciate input    Labs   CBC: Recent Labs  Lab 09/21/24 1629  WBC 9.6  NEUTROABS 8.9*  HGB 9.7*  HCT 30.6*  MCV 93.9  PLT 319    Basic Metabolic Panel: Recent Labs  Lab 09/21/24 1629  NA 142  K 5.3*  CL 105  CO2 11*  GLUCOSE 59*  BUN 64*  CREATININE 2.65*  CALCIUM  8.5*   GFR: Estimated Creatinine Clearance: 33.5 mL/min (A) (by C-G formula based on SCr of 2.65 mg/dL (H)). Recent Labs  Lab 09/21/24 1629  WBC 9.6  LATICACIDVEN >9.0*    Liver Function Tests: Recent Labs  Lab 09/21/24 1629  AST 91*  ALT 22  ALKPHOS 136*  BILITOT 0.2  PROT 5.9*  ALBUMIN 3.3*   No results for input(s): LIPASE, AMYLASE in the last 168 hours. No results for input(s): AMMONIA in the last 168 hours.  ABG    Component Value Date/Time   HCO3 9.3 (L) 09/21/2024 1655   ACIDBASEDEF 17.8 (H) 09/21/2024 1655   O2SAT 87.4 09/21/2024 1655     Coagulation Profile: Recent Labs  Lab 09/21/24 1629  INR 1.3*    Cardiac Enzymes: No results for input(s): CKTOTAL, CKMB, CKMBINDEX, TROPONINI in the last 168 hours.  HbA1C: Hgb A1c MFr Bld  Date/Time Value Ref Range Status  07/26/2024 07:22 AM 5.6 4.8 - 5.6 % Final    Comment:    (NOTE)         Prediabetes: 5.7 - 6.4         Diabetes: >6.4         Glycemic control for adults with diabetes: <7.0    04/17/2024 02:36 AM 6.7 (H) 4.8 - 5.6 % Final    Comment:    (NOTE)         Prediabetes: 5.7 - 6.4         Diabetes: >6.4         Glycemic control for adults with diabetes: <7.0     CBG: Recent Labs  Lab 09/21/24 1608 09/21/24 1629 09/21/24 1711 09/21/24 1936  GLUCAP 48* 115* 119* 101*    Review of Systems:   ***  Past Medical History:  He,  has a past medical history of Arthritis, Bladder incontinence, Chronic kidney disease, Diabetes mellitus without complication (HCC), DVT of leg (deep venous thrombosis) (HCC), Dyspnea, Hyperlipidemia, Hypertension, Peripheral vascular disease, and Sepsis (HCC).   Surgical History:   Past Surgical History:  Procedure Laterality Date   ABDOMINAL AORTOGRAM W/LOWER EXTREMITY N/A 10/10/2016   Procedure: Abdominal  Aortogram w/Lower Extremity;  Surgeon: Cordella KANDICE Shawl, MD;  Location: [facility] IN[facility]ASIVE [facility];  Service: Cardiovascular;  Laterality: N/A;   CATARACT EXTRACTION W/PHACO Left 05/27/2018   Procedure: CATARACT EXTRACTION PHACO AND INTRAOCULAR LENS PLACEMENT (IOC);  Surgeon: Myrna Adine Anes, MD;  Location: [facility] ORS;  Service: Ophthalmology;  Laterality: Left;  US  03:06.2 AP% 16.1 CDE 30.16 FLUID PACK LOT @ [id number] H   CATARACT EXTRACTION W/PHACO Right 07/13/2023   Procedure: CATARACT EXTRACTION PHACO AND INTRAOCULAR LENS PLACEMENT (IOC) RIGHT DIABETIC 4.00 00:36.6;  Surgeon: Myrna Adine Anes, MD;  Location: [facility];  Service: Ophthalmology;  Laterality: Right;   CIRCUMCISION  1998   COLONOSCOPY WITH PROPOFOL  N/A 08/20/2023   Procedure: COLONOSCOPY WITH PROPOFOL ;  Surgeon: Unk Corinn Skiff, MD;  Location: [facility];  Service: Gastroenterology;  Laterality: N/A;   EYE SURGERY Left    Retina Detachment   GRAFT APPLICATION Right 05/29/2017   Procedure: GRAFT APPLICATION ( RIGHT FOOT );  Surgeon: Shawl Cordella KANDICE, MD;  Location: [facility] ORS;  Service: Vascular;  Laterality: Right;  graft taken from patients right thigh    IRRIGATION AND DEBRIDEMENT FOOT Right 10/03/2016   Procedure: IRRIGATION AND DEBRIDEMENT FOOT;  Surgeon: Eva Gay, DPM;  Location: [facility] OR[facility];  Service: Podiatry;  Laterality: Right;   LOWER EXTREMITY ANGIOGRAPHY Right 02/24/2023   Procedure: Lower Extremity Angiography;  Surgeon: Shawl Cordella KANDICE, MD;  Location: [facility];  Service: Cardiovascular;  Laterality: Right;   LOWER EXTREMITY INTERVENTION  10/10/2016   Procedure: Lower Extremity Intervention;  Surgeon: Cordella KANDICE Shawl, MD;  Location: [facility];  Service: Cardiovascular;;   PERIPHERAL VASCULAR BALLOON ANGIOPLASTY Left 10/10/2016   Procedure: Peripheral Vascular Balloon Angioplasty;  Surgeon: Cordella KANDICE Shawl, MD;  Location: [facility];  Service: Cardiovascular;  Laterality: Left;   TONSILLECTOMY     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 His smoking use included pipe and cigarettes. He has never used smokeless tobacco. He reports that he does not drink alcohol and does not use drugs.   Family History:  His family history includes Cancer in his father; Diabetes in his father and mother; Hypertension in his father and mother.   Allergies Allergies[1]   Home Medications  Prior to Admission medications  Medication Sig Start Date End Date Taking? Authorizing Provider  acetaminophen  (TYLENOL ) 325 MG tablet Take 2 tablets (650 mg total) by mouth every 6 (six) hours as needed for mild pain (pain score 1-3) or fever (or Fever >/= 101). 08/12/24  Yes Alexander, Natalie, DO  ascorbic Acid (VITAMIN C) 500  MG CPCR Take 500 mg by mouth daily.   Yes [provider]  aspirin  EC 81 MG tablet Take 81 mg by mouth daily. Swallow whole.   Yes [provider]  atorvastatin  (LIPITOR) 10 MG tablet Take 10 mg by mouth every evening. 06/12/20  Yes [provider]  collagenase  (SANTYL ) 250 UNIT/GM ointment Apply topically daily. To wound / pressure ulcers 08/13/24  Yes Alexander, Natalie, DO  cyanocobalamin  1000 MCG tablet Take 1 tablet (1,000 mcg total) by mouth daily.  08/13/24  Yes Alexander, Natalie, DO  enzalutamide  (XTANDI ) 40 MG tablet Take 4 tablets (160 mg total) by mouth daily. 09/13/24  Yes [name], MD  loperamide  (IMODIUM ) 2 MG capsule Take 2 capsules (4 mg total) by mouth every 8 (eight) hours as needed for diarrhea or loose stools. 08/12/24  Yes Alexander, Natalie, DO  LORazepam  (ATIVAN ) 1 MG tablet Take 1 tablet (1 mg total) by mouth every 8 (eight) hours. 08/12/24  Yes Alexander, Natalie, DO  metFORMIN  (GLUCOPHAGE ) 1000 MG tablet Take 1,000 mg by mouth 2 (two) times daily with a meal. 06/12/20  Yes [provider]  midodrine  (PROAMATINE ) 5 MG tablet Take 1 tablet (5 mg total) by mouth 3 (three) times daily with meals. 08/12/24  Yes Alexander, Natalie, DO  ondansetron  (ZOFRAN ) 4 MG tablet Take 1 tablet (4 mg total) by mouth every 6 (six) hours as needed for nausea. 08/12/24  Yes Alexander, Natalie, DO  predniSONE  (DELTASONE ) 20 MG tablet Take 3 tablets (60 mg total) by mouth daily with breakfast. Further refill / taper instructions per oncology Patient taking differently: Take 40 mg by mouth daily with breakfast. Further refill / taper instructions per oncology 08/13/24  Yes Marsa Edelman, DO  TRADJENTA  5 MG TABS tablet Take 5 mg by mouth daily. 09/09/24  Yes [provider]  Zinc  220 (50 Zn) MG CAPS Take 220 mg by mouth daily.   Yes [provider]  saxagliptin HCl (ONGLYZA) 5 MG TABS tablet Take 5 mg by mouth daily. Patient not  taking: Reported on 09/13/2024    [provider]     Critical care time: ***              [1]  Allergies Allergen Reactions   Sulfa Antibiotics Rash   Piperacillin -Tazobactam In Dex    "

## 2024-09-21 NOTE — ED Triage Notes (Addendum)
 Pt to ED via POV from [facility]. Pt sent for hypoglycemia with CBG in the 40s. Pt has been more lethargic and slumping over in wheelchair, drooling. Hx of pancreatic cancer. Sister with pt. Pt taken to room 17 on arrival with triage staff. Sister unsure if pt took insulin  this am.

## 2024-09-21 NOTE — ED Notes (Signed)
 LR bolus placed on new PIV in RFA. Pressure applied to promote flow.

## 2024-09-21 NOTE — ED Provider Notes (Signed)
 "  [facility] Provider Note    Event Date/Time   First MD Initiated Contact with Patient 09/21/24 1606     (approximate)   History   Weakness   HPI  [name] is a 74 y.o. male  who presents to the emergency department today from [place] clinic after being found to be hypotensive.  Most history is obtained from wife at bedside.  She states that this was a scheduled appointment today to check on his feet.  Patient has history of infection and amputation.  She does state however over the past week or so the patient has been weak.  He has had EMS come out to the house but has refused transport.  She figured that once they got to the clinic appointment today they would send him to the emergency department.  Patient himself states that for the most part he has felt okay except for a little shortness of breath.  He denies any associated chest pain or cough.  He denies any fevers or chills.     Physical Exam   Triage Vital Signs: ED Triage Vitals  Encounter Vitals Group     BP 09/21/24 1612 (!) 53/38     Girls Systolic BP Percentile --      Girls Diastolic BP Percentile --      Boys Systolic BP Percentile --      Boys Diastolic BP Percentile --      Pulse Rate 09/21/24 1617 76     Resp 09/21/24 1617 (!) 32     Temp 09/21/24 1613 (!) 90.9 F (32.7 C)     Temp Source 09/21/24 1613 Rectal     SpO2 09/21/24 1619 100 %     Weight --      Height --      Head Circumference --      Peak Flow --      Pain Score --      Pain Loc --      Pain Education --      Exclude from Growth Chart --     Most recent vital signs: Vitals:   09/21/24 1617 09/21/24 1619  BP:    Pulse: 76 78  Resp: (!) 32 18  Temp:    SpO2:  100%   General: Awake, alert, oriented. CV:  Good peripheral perfusion. Regular rate and rhythm. Resp:  Normal effort. Lungs clear. Abd:  No distention. No tenderness  ED Results / Procedures / Treatments   Labs (all labs ordered are  listed, but only abnormal results are displayed) Labs Reviewed  COMPREHENSIVE METABOLIC PANEL WITH GFR - Abnormal; Notable for the following components:      Result Value   Potassium 5.3 (*)    CO2 11 (*)    Glucose, Bld 59 (*)    BUN 64 (*)    Creatinine, Ser 2.65 (*)    Calcium  8.5 (*)    Total Protein 5.9 (*)    Albumin 3.3 (*)    AST 91 (*)    Alkaline Phosphatase 136 (*)    GFR, Estimated 25 (*)    Anion gap 26 (*)    All other components within normal limits  LACTIC ACID, PLASMA - Abnormal; Notable for the following components:   Lactic Acid, Venous >9.0 (*)    All other components within normal limits  CBC WITH DIFFERENTIAL/PLATELET - Abnormal; Notable for the following components:   RBC 3.26 (*)    Hemoglobin 9.7 (*)  HCT 30.6 (*)    RDW 19.1 (*)    Neutro Abs 8.9 (*)    Lymphs Abs 0.2 (*)    All other components within normal limits  PROTIME-INR - Abnormal; Notable for the following components:   Prothrombin Time 16.6 (*)    INR 1.3 (*)    All other components within normal limits  URINALYSIS, W/ REFLEX TO CULTURE (INFECTION SUSPECTED) - Abnormal; Notable for the following components:   Color, Urine AMBER (*)    APPearance CLOUDY (*)    Hgb urine dipstick SMALL (*)    Protein, ur >=300 (*)    Leukocytes,Ua MODERATE (*)    Bacteria, UA RARE (*)    All other components within normal limits  BLOOD GAS, VENOUS - Abnormal; Notable for the following components:   pH, Ven 7.16 (*)    pCO2, Ven 26 (*)    pO2, Ven 58 (*)    Bicarbonate 9.3 (*)    Acid-base deficit 17.8 (*)    All other components within normal limits  CBG MONITORING, ED - Abnormal; Notable for the following components:   Glucose-Capillary 48 (*)    All other components within normal limits  CBG MONITORING, ED - Abnormal; Notable for the following components:   Glucose-Capillary 115 (*)    All other components within normal limits  CBG MONITORING, ED - Abnormal; Notable for the following  components:   Glucose-Capillary 119 (*)    All other components within normal limits  CBG MONITORING, ED - Abnormal; Notable for the following components:   Glucose-Capillary 101 (*)    All other components within normal limits  CULTURE, BLOOD (ROUTINE X 2)  CULTURE, BLOOD (ROUTINE X 2)  URINE CULTURE  LACTIC ACID, PLASMA     EKG  I, Guadalupe Eagles, attending physician, personally viewed and interpreted this EKG  EKG Time: 1618 Rate: 75 Rhythm: sinus rhythm Axis: normal Intervals: qtc 540 QRS: IVCD ST changes: no st elevation Impression: abnormal ekg  PROCEDURES:  Critical Care performed: Yes  CRITICAL CARE Performed by: Guadalupe Eagles   Total critical care time: 35 minutes  Critical care time was exclusive of separately billable procedures and treating other patients.  Critical care was necessary to treat or prevent imminent or life-threatening deterioration.  Critical care was time spent personally by me on the following activities: development of treatment plan with patient and/or surrogate as well as nursing, discussions with consultants, evaluation of patient's response to treatment, examination of patient, obtaining history from patient or surrogate, ordering and performing treatments and interventions, ordering and review of laboratory studies, ordering and review of radiographic studies, pulse oximetry and re-evaluation of patient's condition.   Procedures    MEDICATIONS ORDERED IN ED: Medications  dextrose  50 % solution 50 mL (50 mLs Intravenous Given 09/21/24 1620)  sodium chloride  0.9 % bolus 1,000 mL (1,000 mLs Intravenous New Bag/Given 09/21/24 1627)  sodium chloride  0.9 % bolus 1,000 mL (1,000 mLs Intravenous New Bag/Given 09/21/24 1627)     IMPRESSION / MDM / ASSESSMENT AND PLAN / ED COURSE  I reviewed the triage vital signs and the nursing notes.                              Differential diagnosis includes, but is not limited to, dehydration,  infection, electrolyte abnormality  Patient's presentation is most consistent with acute presentation with potential threat to life or bodily function.   The patient is  on the cardiac monitor to evaluate for evidence of arrhythmia and/or significant heart rate changes.  Patient presented to the emergency department today from podiatry clinic because of concerns for low blood pressure, however per wife has been sick for at least a week.  Patient was found to be both hypotensive and hypoglycemic upon arrival to the emergency department.  Additionally patient was found to be hypothermic.  Did have concerns for possible sepsis so IV fluids and antibiotics was ordered.  Blood work is concerning for significant lactic acidosis.  Patient's blood pressure did not improve significantly after IV fluids.  I did discuss with patient and he stated he had not taken his midodrine  today so I did we will give patient dose of that as well as started on Levophed .  Patient has remained alert oriented x 4 here in the emergency department.  Will discuss with ICU provider for admission.    FINAL CLINICAL IMPRESSION(S) / ED DIAGNOSES   Final diagnoses:  Lactic acidosis  Hypothermia, initial encounter  Hypotension, unspecified hypotension type      Note:  This document was prepared using Dragon voice recognition software and may include unintentional dictation errors.    Floy Roberts, MD 09/21/24 2014  "

## 2024-09-21 NOTE — ED Triage Notes (Addendum)
 First nurse note: Pt to ED via POV from [facility]. Pt sent for hypotension. Hx of cancer. Last admission for hypoglycemia. CBG 40 PTA.

## 2024-09-21 NOTE — ED Notes (Signed)
 While in room assisting primary RN with obtaining second PIV access for pt, pt became increasingly hypotensive. See recently charted vs. Levo running and rate titrated. Pt appears in no newly acute or worsening distress. Pt is sleeping but wakes to verbal stimuli and follows commands. Pt is oriented when awake as well to place and self. Pt breathing noted to be unlabored with symmetric chest rise and fall. Bed is low and locked with full cardiac monitor in place. Call bell is in reach and side rails are raised x2. Bear hugger is also in use for pt hypothermia. Primary RN updated as to contents of this note.

## 2024-09-22 ENCOUNTER — Inpatient Hospital Stay

## 2024-09-22 ENCOUNTER — Telehealth: Payer: Self-pay | Admitting: *Deleted

## 2024-09-22 ENCOUNTER — Telehealth: Payer: Self-pay | Admitting: Internal Medicine

## 2024-09-22 DIAGNOSIS — R6521 Severe sepsis with septic shock: Secondary | ICD-10-CM

## 2024-09-22 DIAGNOSIS — A419 Sepsis, unspecified organism: Secondary | ICD-10-CM | POA: Diagnosis not present

## 2024-09-22 LAB — CBC
HCT: 26.8 % — ABNORMAL LOW (ref 39.0–52.0)
HCT: 28.9 % — ABNORMAL LOW (ref 39.0–52.0)
Hemoglobin: 8.8 g/dL — ABNORMAL LOW (ref 13.0–17.0)
Hemoglobin: 8.9 g/dL — ABNORMAL LOW (ref 13.0–17.0)
MCH: 30.3 pg (ref 26.0–34.0)
MCH: 30.4 pg (ref 26.0–34.0)
MCHC: 30.4 g/dL (ref 30.0–36.0)
MCHC: 33.2 g/dL (ref 30.0–36.0)
MCV: 100 fL (ref 80.0–100.0)
MCV: 91.2 fL (ref 80.0–100.0)
Platelets: 302 K/uL (ref 150–400)
Platelets: 320 K/uL (ref 150–400)
RBC: 2.89 MIL/uL — ABNORMAL LOW (ref 4.22–5.81)
RBC: 2.94 MIL/uL — ABNORMAL LOW (ref 4.22–5.81)
RDW: 19 % — ABNORMAL HIGH (ref 11.5–15.5)
RDW: 19.7 % — ABNORMAL HIGH (ref 11.5–15.5)
WBC: 10.3 K/uL (ref 4.0–10.5)
WBC: 10.3 K/uL (ref 4.0–10.5)
nRBC: 0 % (ref 0.0–0.2)
nRBC: 0 % (ref 0.0–0.2)

## 2024-09-22 LAB — COMPREHENSIVE METABOLIC PANEL WITH GFR
ALT: 20 U/L (ref 0–44)
AST: 95 U/L — ABNORMAL HIGH (ref 15–41)
Albumin: 2.8 g/dL — ABNORMAL LOW (ref 3.5–5.0)
Alkaline Phosphatase: 120 U/L (ref 38–126)
Anion gap: 21 — ABNORMAL HIGH (ref 5–15)
BUN: 60 mg/dL — ABNORMAL HIGH (ref 8–23)
CO2: 13 mmol/L — ABNORMAL LOW (ref 22–32)
Calcium: 7.9 mg/dL — ABNORMAL LOW (ref 8.9–10.3)
Chloride: 106 mmol/L (ref 98–111)
Creatinine, Ser: 2.09 mg/dL — ABNORMAL HIGH (ref 0.61–1.24)
GFR, Estimated: 33 mL/min — ABNORMAL LOW
Glucose, Bld: 133 mg/dL — ABNORMAL HIGH (ref 70–99)
Potassium: 4.8 mmol/L (ref 3.5–5.1)
Sodium: 140 mmol/L (ref 135–145)
Total Bilirubin: 0.2 mg/dL (ref 0.0–1.2)
Total Protein: 5.3 g/dL — ABNORMAL LOW (ref 6.5–8.1)

## 2024-09-22 LAB — GLUCOSE, CAPILLARY
Glucose-Capillary: 107 mg/dL — ABNORMAL HIGH (ref 70–99)
Glucose-Capillary: 129 mg/dL — ABNORMAL HIGH (ref 70–99)
Glucose-Capillary: 143 mg/dL — ABNORMAL HIGH (ref 70–99)
Glucose-Capillary: 151 mg/dL — ABNORMAL HIGH (ref 70–99)
Glucose-Capillary: 138 mg/dL — ABNORMAL HIGH (ref 70–99)
Glucose-Capillary: 166 mg/dL — ABNORMAL HIGH (ref 70–99)
Glucose-Capillary: 156 mg/dL — ABNORMAL HIGH (ref 70–99)
Glucose-Capillary: 135 mg/dL — ABNORMAL HIGH (ref 70–99)

## 2024-09-22 LAB — BLOOD GAS, VENOUS
Acid-base deficit: 11.5 mmol/L — ABNORMAL HIGH (ref 0.0–2.0)
Acid-base deficit: 7.9 mmol/L — ABNORMAL HIGH (ref 0.0–2.0)
Acid-base deficit: 3.1 mmol/L — ABNORMAL HIGH (ref 0.0–2.0)
Bicarbonate: 13.3 mmol/L — ABNORMAL LOW (ref 20.0–28.0)
Bicarbonate: 16.9 mmol/L — ABNORMAL LOW (ref 20.0–28.0)
Bicarbonate: 21.2 mmol/L (ref 20.0–28.0)
O2 Saturation: 69.4 %
O2 Saturation: 60.5 %
O2 Saturation: 63.1 %
Patient temperature: 37
Patient temperature: 37
Patient temperature: 37
pCO2, Ven: 27 mmHg — ABNORMAL LOW (ref 44–60)
pCO2, Ven: 32 mmHg — ABNORMAL LOW (ref 44–60)
pCO2, Ven: 35 mmHg — ABNORMAL LOW (ref 44–60)
pH, Ven: 7.3 (ref 7.25–7.43)
pH, Ven: 7.33 (ref 7.25–7.43)
pH, Ven: 7.39 (ref 7.25–7.43)
pO2, Ven: 37 mmHg (ref 32–45)
pO2, Ven: 32 mmHg (ref 32–45)
pO2, Ven: 32 mmHg (ref 32–45)

## 2024-09-22 LAB — TROPONIN T

## 2024-09-22 LAB — MAGNESIUM: Magnesium: 1.4 mg/dL — ABNORMAL LOW (ref 1.7–2.4)

## 2024-09-22 LAB — PHOSPHORUS: Phosphorus: 4.6 mg/dL (ref 2.5–4.6)

## 2024-09-22 LAB — PROCALCITONIN: Procalcitonin: 0.3 ng/mL

## 2024-09-22 LAB — URINE CULTURE: Culture: NO GROWTH

## 2024-09-22 LAB — CREATININE, SERUM
Creatinine, Ser: 2.23 mg/dL — ABNORMAL HIGH (ref 0.61–1.24)
GFR, Estimated: 30 mL/min — ABNORMAL LOW

## 2024-09-22 LAB — BASIC METABOLIC PANEL WITH GFR
Anion gap: 16 — ABNORMAL HIGH (ref 5–15)
BUN: 54 mg/dL — ABNORMAL HIGH (ref 8–23)
CO2: 16 mmol/L — ABNORMAL LOW (ref 22–32)
Calcium: 7.3 mg/dL — ABNORMAL LOW (ref 8.9–10.3)
Chloride: 109 mmol/L (ref 98–111)
Creatinine, Ser: 1.75 mg/dL — ABNORMAL HIGH (ref 0.61–1.24)
GFR, Estimated: 40 mL/min — ABNORMAL LOW
Glucose, Bld: 176 mg/dL — ABNORMAL HIGH (ref 70–99)
Potassium: 4.8 mmol/L (ref 3.5–5.1)
Sodium: 140 mmol/L (ref 135–145)

## 2024-09-22 LAB — MRSA NEXT GEN BY PCR, NASAL: MRSA by PCR Next Gen: DETECTED — AB

## 2024-09-22 LAB — LACTIC ACID, PLASMA
Lactic Acid, Venous: 1 mmol/L (ref 0.5–1.9)
Lactic Acid, Venous: 1.1 mmol/L (ref 0.5–1.9)
Lactic Acid, Venous: 2.9 mmol/L (ref 0.5–1.9)
Lactic Acid, Venous: 5.3 mmol/L (ref 0.5–1.9)
Lactic Acid, Venous: >9 mmol/L (ref 0.5–1.9)

## 2024-09-22 LAB — CK
Total CK: 1442 U/L — ABNORMAL HIGH (ref 49–397)
Total CK: 2134 U/L — ABNORMAL HIGH (ref 49–397)

## 2024-09-22 LAB — TROPONIN T, HIGH SENSITIVITY: Troponin T High Sensitivity: 152 ng/L (ref 0–19)

## 2024-09-22 MED ORDER — LACTATED RINGERS IV BOLUS
1000.0000 mL | Freq: Once | INTRAVENOUS | Status: AC
  Administered 2024-09-22: 1000 mL via INTRAVENOUS

## 2024-09-22 MED ORDER — NOREPINEPHRINE 16 MG/250ML-% IV SOLN
0.0000 ug/min | INTRAVENOUS | Status: DC
  Administered 2024-09-22: 10 ug/min via INTRAVENOUS
  Administered 2024-09-23: 12 ug/min via INTRAVENOUS
  Administered 2024-09-24: 4 ug/min via INTRAVENOUS
  Filled 2024-09-22 (×4): qty 250

## 2024-09-22 MED ORDER — LORAZEPAM 2 MG/ML IJ SOLN: 0.5000 mg | Freq: Four times a day (QID) | INTRAMUSCULAR | Status: DC | PRN

## 2024-09-22 MED ORDER — SODIUM CHLORIDE 0.9 % IV SOLN
100.0000 mg | Freq: Two times a day (BID) | INTRAVENOUS | Status: DC
  Administered 2024-09-22 – 2024-09-23 (×3): 100 mg via INTRAVENOUS
  Filled 2024-09-22 (×3): qty 100

## 2024-09-22 MED ORDER — ORAL CARE MOUTH RINSE: 15.0000 mL | OROMUCOSAL | Status: DC | PRN

## 2024-09-22 MED ORDER — SODIUM BICARBONATE 8.4 % IV SOLN: 50.0000 meq | Freq: Once | INTRAVENOUS | Status: DC

## 2024-09-22 MED ORDER — STERILE WATER FOR INJECTION IV SOLN
INTRAVENOUS | Status: DC
  Filled 2024-09-22: qty 1000
  Filled 2024-09-22: qty 150
  Filled 2024-09-22: qty 1000

## 2024-09-22 MED ORDER — CHLORHEXIDINE GLUCONATE CLOTH 2 % EX PADS
6.0000 | MEDICATED_PAD | Freq: Every day | CUTANEOUS | Status: DC
  Administered 2024-09-22 – 2024-10-01 (×11): 6 via TOPICAL

## 2024-09-22 MED ORDER — FENTANYL CITRATE (PF) 50 MCG/ML IJ SOSY
25.0000 ug | PREFILLED_SYRINGE | Freq: Once | INTRAMUSCULAR | Status: AC
  Administered 2024-09-22: 50 ug via INTRAVENOUS
  Filled 2024-09-22: qty 1

## 2024-09-22 MED ORDER — MAGNESIUM SULFATE 4 GM/100ML IV SOLN
4.0000 g | Freq: Once | INTRAVENOUS | Status: AC
  Administered 2024-09-22: 4 g via INTRAVENOUS
  Filled 2024-09-22: qty 100

## 2024-09-22 NOTE — Telephone Encounter (Signed)
 Patients sister called wanting to speak to Dr. Damaris nurse. She said he is in the hospital and she has a question about the medicine he is taking. I told her I would let them know.

## 2024-09-22 NOTE — Plan of Care (Signed)
" °  Problem: Coping: Goal: Ability to adjust to condition or change in health will improve Outcome: Progressing   Problem: Fluid Volume: Goal: Ability to maintain a balanced intake and output will improve Outcome: Progressing   Problem: Metabolic: Goal: Ability to maintain appropriate glucose levels will improve Outcome: Progressing   Problem: Clinical Measurements: Goal: Ability to maintain clinical measurements within normal limits will improve Outcome: Progressing   Problem: Nutritional: Goal: Maintenance of adequate nutrition will improve Outcome: Not Progressing   Problem: Skin Integrity: Goal: Risk for impaired skin integrity will decrease Outcome: Not Progressing   Problem: Education: Goal: Knowledge of General Education information will improve Description: Including pain rating scale, medication(s)/side effects and non-pharmacologic comfort measures Outcome: Not Progressing   Problem: Clinical Measurements: Goal: Diagnostic test results will improve Outcome: Not Progressing   "

## 2024-09-22 NOTE — Telephone Encounter (Signed)
 Telephone call from patient's sister, [name] informing us  that Her brother was admitted to the hospital yesterday for weakness, low BP. She is wondering if the new medicine that Dr B gave him can be causing these side effects. She states he has taken the xtandi  for about 5 or 6 days. I instructed her to Hold the xtandi  until he is told by Dr Rennie office to restart the medication. I told her I would inform Dr B of his hospitalization.SABRA

## 2024-09-22 NOTE — Progress Notes (Signed)
 Critical lab called to MD Troponin 152.

## 2024-09-22 NOTE — Progress Notes (Signed)
 "  NAME:  LYNKIN Horn, MRN:  [id number], DOB:  10-18-1950, LOS: 1 ADMISSION DATE:  09/21/2024, CONSULTATION DATE:  09/21/24 REFERRING MD:  Dr. Floy, CHIEF COMPLAINT:  Hypotension   Brief Synopsis:  74 year old male with a past medical history significant for hypotension on midodrine , former HTN, HLD, CKD Stage III and metastatic prostate cancer, admitted for septic shock in the setting of urosepsis with rhabdomyolysis vs pneumonia.  History of Present Illness:  [name] is a 74 year old male with a past medical history significant for hypotension on midodrine , former HTN, HLD, CKD Stage III, bladder incontinence and metastatic prostate cancer who presented to [facility] ED on 1/21 from an outpatient podiatry appointment for evaluation of hypotension.  History gathered per chart review and bedside evaluation It is reported he is wheelchair bound at baseline and recently was discharged from a rehab facility following hospitalization. [name] reported a recent fall on Monday 1/19 when getting up from his recliner. He denied loss of consciousness or hitting his head. He reports he has been feeling weak in general for about a week, but more noticeable after the fall. He denied symptoms of chest pain, cough, nausea/vomiting, dysuria, or flank pain. He did endorse mild shortness of breath but this is chronic. Additionally, he reported some intermittent diarrhea but has been going on for a few months on and off. His sister was with him on arrival and reported trying to get him to be evaluated at the ED the day of his fall but he declined. After arrival to his outpatient podiatry appointment for his chronic diabetic foot ulcers, he was found to be very hypotensive and was sent to the ED.   ED course: Upon arrival to [facility] ED, he was significantly hypotensive and was started on IV fluids for resuscitation. He was minimally responsive to fluids at that time and was started on peripheral levophed .  Code sepsis was activated and he was covered with cefepime  and vancomycin .  Vitals on Arrival: Temp: 89.79F  BP: 91/45 HR: 78 RR: 21 SpO2: 100% on room air  Pertinent Labs/Diagnostics: Lab results revealed severe metabolic acidosis with lactic acidosis, as well as an AKI with hyperkalemia. Mild hypoglycemia and transaminitis.  LILLETTE Jerry Kim, PA-C personally viewed and interpreted this ECG. EKG Interpretation: Date: 09/21/24, EKG Time: 1618, Rate: 75, Rhythm: sinus rhythm, QRS Axis:  normal Intervals: Qtc 540, ST/T Wave abnormalities: No ST elevation or signs of ischemia, Narrative Interpretation: abnormal EKG with intraventricular conduction delay  Chemistry:  Na+:142 K+: 5.3 BUN/Cr.: 64/2.65 Serum CO2/ AG: 11/26 Glucose: 59 CK: 2134  Hematology:  WBC: 9.6 Hgb: 9.7 Platelets 319 INR:  Lactic/ PCT: >9.0/0.30 VBG: pH 7.16 pCO2 26, pO2 58, acid base deficit 17.8, bicarb 9.3  ID: COVID-19 & Influenza A/B: negative UA: moderate leukocytes with proteinuria MRSA PCR: Positive  Medications Administered:  IVF: 2L NS + 1L LR ABX: cefepime  + vancomycin  Shifting measures: 1 amp d50 (no insulin  given hypoglycemia) Vasopressor: levophed  gtt  PCCM consulted for admission due to septic shock requiring vasopressors.   Pertinent  Medical History  Chronic hypotension on Midodrine  HTN - historical Hyperlipidemia CKD Stage 3 Metastatic Prostate Cancer Type II Diabetes Mellitus PVD Previous DVT 2018 Bladder Incontinence Arthritis   Significant Hospital Events: Including procedures, antibiotic start and stop dates in addition to other pertinent events   1/21: Admitted for circulatory/septic shock requiring levophed  gtt. Placed on bair hugger for hypothermia and given cefepime  + vancomycin . PCCM consulted for hypotension  requiring vasopressors. Severe metabolic acidosis. Given 2 amps. 09/22/24- remains critically ill on levophed  15/vasopressin  0.04 not on MV.  Interim History /  Subjective:  Patient remains a&o x 4; improving with fluid resuscitation. Remains severely metabolic but improving. On levophed  and vasopressin  gtt. Started sodium bicarb gtt given acidosis and gave another liter of LR after CK resulting >2000.  Objective    Blood pressure (!) 114/44, pulse 84, temperature 99.7 F (37.6 C), resp. rate 19, height 5' 11 (1.803 m), weight 110.3 kg, SpO2 100%.        Intake/Output Summary (Last 24 hours) at 09/22/2024 1055 Last data filed at 09/22/2024 1000 Gross per 24 hour  Intake 4284.49 ml  Output 2175 ml  Net 2109.49 ml   Filed Weights   09/21/24 2100 09/21/24 2325 09/22/24 0500  Weight: 129.3 kg 110.3 kg 110.3 kg    Examination: General: Adult male, critically ill, lying in bed on room air, NAD HENT: anicteric sclerae, atraumatic, normocephalic, neck supple, no JVD CV: RRR, S1 S2, NSR on monitor, no r/m/g Pulm: Regular, non labored on room ai , breath sounds equal bilaterally, no wheezing/rales/rhonchi GI: soft, non-tender, non-distended, no rebound/guarding, bowel sounds x 4 Extremities: cool/dry, pulses + 2 R/P, trace edema noted, chronic stasis dermatitis with diabetic foot ulcers Neuro: A&O x 4, follows commands, no focal neuro deficits, PERRL   GU: foley in place   Resolved problem list   Assessment and Plan   #Acute Metabolic Encephalopathy ~ very mild and resolving #Pain s/t Chronic Diabetic Foot Ulcers Hx: Anxiety on Ativan  1mg  at home - Supportive care - Routine neuro checks - Ativan  0.5mg  q6h prn - Fentanyl  25-78mcg - Control pain as able - ICU delirium precautions - Wound care as able  #Circulatory Shock s/t suspected Urosepsis + Rhabdomyolysis vs Pneumonia  Hx: Hypertension, HLD, DVT 2018, Pressure ulcers Initial interventions/workup included: 2 L of NS + 1 LR  Initial ABX: Cefepime  + Vancomycin  - Continuous cardiac monitoring - Consider vasopressors to maintain MAP goal > 65 ~ Levophed  + Vasopressin  - Wean pressors  as able - Consider stress dose steroids if persistent: Hydrocortisone  50mg  q6h or 100q8 - Fluid resuscitation: on bicarb gtt and gave additional 1L LR - Trend WBC and monitor fever curve - Trend lactic - Lactic/PCT on admission: >9.0/ 0.30 - IV ABX: Cefepime  + Flagyl  given reaction to Zosyn  in the past - Cultures pending; narrow abx following culture and sensitivities - MRSA PCR positive - UA with leukocytes, urine culture ordered  #?CAP - Less likely given reassuring CXR and not requiring supplemental O2 On empiric abx s/p vanco in ER now on doxycycline  BID  #Chronic Dyspnea - not requiring home O2 ECHO in 2018: LVEF 55-60% with normal systolic function 1/22 CXR: hypoventilation with probable atelectasis of left base - Supplemental oxygen to maintain SpO2 >92% - Currently on room air - CXR and ABG as indicated  #AKI on CKD Stage 3 #AGMA + Lactic Acidosis #Hyperkalemia #Rhabdomyolysis Hx: CKD Stage 3 (Baseline 0.79-0.93) Initial Shifting Measures: 1 amp D50 No home meds to include SGLT2i, less likely AGMA iso of Euglycemic DKA - Strict I/O's: alert provider if UOP < 0.5 mL/kg/hr - Trend BMP, Mg and Phos - Ensure Mg >2 and Phos >4 - CK >2000; trend daily - Avoid nephrotoxins as able - Ensure adequate renal perfusion - Replace electrolytes as indicated - Sodium Bicarb gtt; decreased rate to 50/hr given improving acidosis - May need Renal US  if creatinine starts declining - Consider Nephro  consult if renal function worsens or severe electrolyte derangements are present  #Mild Transaminitis - Trend hepatic function - AST/ALT: 91/22 - Alk Phos: 136 - Diet: NPO - Constipation protocol prn - Antiemetics if nausea present; check Qtc prior  - If LFTs continue to rise tomorrow, consider RUQ Ultrasound  #Anemia of Chronic Kidney Disease #Supratherapeutic INR - Trend CBC and monitor coags - Transfuse if HGB <7 or active bleeding - Monitor for signs and symptoms of bleeding -  DVT Prophylaxis: SQ Heparin   #Type II Diabetes Mellitus #Hypoglycemia Initial Interventions: 1 amp D50 - ICU hypo/hyperglycemia protocol - CBG q4h - SSI: Novolog  as indicated - Goal Range: 140-180 - Diabetes Coordinator consult; appreciate input  #Stage 4 Prostate Cancer with  pelvic nodal and diffuse osseous metastasis Hx: Sees Dr. Rennie at [facility] - Followed outpatient; currently on Firmagon  injections, previously treated with apalutamide  but d/c given agranulocytosis    Labs   CBC: Recent Labs  Lab 09/21/24 1629 09/22/24 0002 09/22/24 0144  WBC 9.6 10.3 10.3  NEUTROABS 8.9*  --   --   HGB 9.7* 8.8* 8.9*  HCT 30.6* 28.9* 26.8*  MCV 93.9 100.0 91.2  PLT 319 320 302    Basic Metabolic Panel: Recent Labs  Lab 09/21/24 1629 09/22/24 0002 09/22/24 0144 09/22/24 0608  NA 142  --  140 140  K 5.3*  --  4.8 4.8  CL 105  --  106 109  CO2 11*  --  13* 16*  GLUCOSE 59*  --  133* 176*  BUN 64*  --  60* 54*  CREATININE 2.65* 2.23* 2.09* 1.75*  CALCIUM  8.5*  --  7.9* 7.3*  MG  --   --  1.4*  --   PHOS  --   --  4.6  --    GFR: Estimated Creatinine Clearance: 46.8 mL/min (A) (by C-G formula based on SCr of 1.75 mg/dL (H)). Recent Labs  Lab 09/21/24 1629 09/22/24 0002 09/22/24 0144 09/22/24 0608  PROCALCITON  --  0.30  --   --   WBC 9.6 10.3 10.3  --   LATICACIDVEN >9.0* >9.0* 5.3* 2.9*    Liver Function Tests: Recent Labs  Lab 09/21/24 1629 09/22/24 0144  AST 91* 95*  ALT 22 20  ALKPHOS 136* 120  BILITOT 0.2 0.2  PROT 5.9* 5.3*  ALBUMIN 3.3* 2.8*   No results for input(s): LIPASE, AMYLASE in the last 168 hours. No results for input(s): AMMONIA in the last 168 hours.  ABG    Component Value Date/Time   HCO3 16.9 (L) 09/22/2024 0500   ACIDBASEDEF 7.9 (H) 09/22/2024 0500   O2SAT 60.5 09/22/2024 0500     Coagulation Profile: Recent Labs  Lab 09/21/24 1629  INR 1.3*    Cardiac Enzymes: Recent Labs  Lab 09/22/24 0002 09/22/24 0608   CKTOTAL 2,134* 1,442*    HbA1C: Hgb A1c MFr Bld  Date/Time Value Ref Range Status  07/26/2024 07:22 AM 5.6 4.8 - 5.6 % Final    Comment:    (NOTE)         Prediabetes: 5.7 - 6.4         Diabetes: >6.4         Glycemic control for adults with diabetes: <7.0   04/17/2024 02:36 AM 6.7 (H) 4.8 - 5.6 % Final    Comment:    (NOTE)         Prediabetes: 5.7 - 6.4         Diabetes: >6.4  Glycemic control for adults with diabetes: <7.0     CBG: Recent Labs  Lab 09/21/24 1936 09/21/24 2313 09/22/24 0359 09/22/24 0533 09/22/24 0738  GLUCAP 101* 107* 129* 143* 151*    Review of Systems:    10 point ROS and no new findings except as per HPI   Past Medical History:  He,  has a past medical history of Arthritis, Bladder incontinence, Chronic kidney disease, Diabetes mellitus without complication (HCC), DVT of leg (deep venous thrombosis) (HCC), Dyspnea, Hyperlipidemia, Hypertension, Peripheral vascular disease, and Sepsis (HCC).   Surgical History:   Past Surgical History:  Procedure Laterality Date   ABDOMINAL AORTOGRAM W/LOWER EXTREMITY N/A 10/10/2016   Procedure: Abdominal Aortogram w/Lower Extremity;  Surgeon: Cordella KANDICE Shawl, MD;  Location: [facility] INVASIVE CV LAB;  Service: Cardiovascular;  Laterality: N/A;   CATARACT EXTRACTION W/PHACO Left 05/27/2018   Procedure: CATARACT EXTRACTION PHACO AND INTRAOCULAR LENS PLACEMENT (IOC);  Surgeon: Myrna Adine Anes, MD;  Location: [facility] ORS;  Service: Ophthalmology;  Laterality: Left;  US  03:06.2 AP% 16.1 CDE 30.16 FLUID PACK LOT @ [id number] H   CATARACT EXTRACTION W/PHACO Right 07/13/2023   Procedure: CATARACT EXTRACTION PHACO AND INTRAOCULAR LENS PLACEMENT (IOC) RIGHT DIABETIC 4.00 00:36.6;  Surgeon: Myrna Adine Anes, MD;  Location: [facility];  Service: Ophthalmology;  Laterality: Right;   CIRCUMCISION  1998   COLONOSCOPY WITH PROPOFOL  N/A 08/20/2023   Procedure: COLONOSCOPY WITH PROPOFOL ;  Surgeon: Unk Corinn Skiff, MD;  Location: [facility] ENDOSCOPY;  Service: Gastroenterology;  Laterality: N/A;   EYE SURGERY Left    Retina Detachment   GRAFT APPLICATION Right 05/29/2017   Procedure: GRAFT APPLICATION ( RIGHT FOOT );  Surgeon: Shawl Cordella KANDICE, MD;  Location: [facility] ORS;  Service: Vascular;  Laterality: Right;  graft taken from patients right thigh   IRRIGATION AND DEBRIDEMENT FOOT Right 10/03/2016   Procedure: IRRIGATION AND DEBRIDEMENT FOOT;  Surgeon: Eva Gay, DPM;  Location: [facility] ORS;  Service: Podiatry;  Laterality: Right;   LOWER EXTREMITY ANGIOGRAPHY Right 02/24/2023   Procedure: Lower Extremity Angiography;  Surgeon: Shawl Cordella KANDICE, MD;  Location: [facility] INVASIVE CV LAB;  Service: Cardiovascular;  Laterality: Right;   LOWER EXTREMITY INTERVENTION  10/10/2016   Procedure: Lower Extremity Intervention;  Surgeon: Cordella KANDICE Shawl, MD;  Location: [facility] INVASIVE CV LAB;  Service: Cardiovascular;;   PERIPHERAL VASCULAR BALLOON ANGIOPLASTY Left 10/10/2016   Procedure: Peripheral Vascular Balloon Angioplasty;  Surgeon: Cordella KANDICE Shawl, MD;  Location: [facility] INVASIVE CV LAB;  Service: Cardiovascular;  Laterality: Left;   TONSILLECTOMY     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 His smoking use included pipe and cigarettes. He has never used smokeless tobacco. He reports that he does not drink alcohol and does not use drugs.   Family History:  His family history includes Cancer in his father; Diabetes in his father and mother; Hypertension in his father and mother.   Allergies Allergies[1]   Home  Medications  Prior to Admission medications  Medication Sig Start Date End Date Taking? Authorizing Provider  acetaminophen  (TYLENOL ) 325 MG tablet Take 2 tablets (650 mg total)  by mouth every 6 (six) hours as needed for mild pain (pain score 1-3) or fever (or Fever >/= 101). 08/12/24  Yes Alexander, Natalie, DO  ascorbic Acid (VITAMIN C) 500 MG CPCR Take 500 mg by mouth daily.   Yes [provider]  aspirin  EC 81 MG tablet Take 81 mg by mouth daily. Swallow whole.   Yes [provider]  atorvastatin  (LIPITOR) 10 MG tablet Take 10 mg by mouth every evening. 06/12/20  Yes [provider]  collagenase  (SANTYL ) 250 UNIT/GM ointment Apply topically daily. To wound / pressure ulcers 08/13/24  Yes Alexander, Natalie, DO  cyanocobalamin  1000 MCG tablet Take 1 tablet (1,000 mcg total) by mouth daily. 08/13/24  Yes Alexander, Natalie, DO  enzalutamide  (XTANDI ) 40 MG tablet Take 4 tablets (160 mg total) by mouth daily. 09/13/24  Yes [name], MD  loperamide  (IMODIUM ) 2 MG capsule Take 2 capsules (4 mg total) by mouth every 8 (eight) hours as needed for diarrhea or loose stools. 08/12/24  Yes Alexander, Natalie, DO  LORazepam  (ATIVAN ) 1 MG tablet Take 1 tablet (1 mg total) by mouth every 8 (eight) hours. 08/12/24  Yes Alexander, Natalie, DO  metFORMIN  (GLUCOPHAGE ) 1000 MG tablet Take 1,000 mg by mouth 2 (two) times daily with a meal. 06/12/20  Yes [provider]  midodrine  (PROAMATINE ) 5 MG tablet Take 1 tablet (5 mg total) by mouth 3 (three) times daily with meals. 08/12/24  Yes Alexander, Natalie, DO  ondansetron  (ZOFRAN ) 4 MG tablet Take 1 tablet (4 mg total) by mouth every 6 (six) hours as needed for nausea. 08/12/24  Yes Alexander, Natalie, DO  predniSONE  (DELTASONE ) 20 MG tablet Take 3 tablets (60 mg total) by mouth daily with breakfast. Further refill / taper instructions per oncology Patient taking differently: Take 40 mg by mouth daily with breakfast.  Further refill / taper instructions per oncology 08/13/24  Yes Marsa Edelman, DO  TRADJENTA  5 MG TABS tablet Take 5 mg by mouth daily. 09/09/24  Yes [provider]  Zinc  220 (50 Zn) MG CAPS Take 220 mg by mouth daily.   Yes [provider]  saxagliptin HCl (ONGLYZA) 5 MG TABS tablet Take 5 mg by mouth daily. Patient not taking: Reported on 09/13/2024    [provider]    Critical care provider statement:   Total critical care time: 33 minutes   Performed by: Parris MD   Critical care time was exclusive of separately billable procedures and treating other patients.   Critical care was necessary to treat or prevent imminent or life-threatening deterioration.   Critical care was time spent personally by me on the following activities: development of treatment plan with patient and/or surrogate as well as nursing, discussions with consultants, evaluation of patient's response to treatment, examination of patient, obtaining history from patient or surrogate, ordering and performing treatments and interventions, ordering and review of laboratory studies, ordering and review of radiographic studies, pulse oximetry and re-evaluation of patient's condition.    Donnel Venuto, M.D.  Pulmonary & Critical Care Medicine            [1]  Allergies Allergen Reactions   Sulfa Antibiotics Rash   Piperacillin -Tazobactam In Dex    "

## 2024-09-22 NOTE — TOC CM/SW Note (Signed)
 CSW went by room to complete readmission screen but patient was asleep and did not wake up to CSW calling his name. He is active with [facility] for nursing only. He was recently at [facility] 12/12-1/1 (20 days).  Lauraine Carpen, CSW [phone]

## 2024-09-22 NOTE — Consult Note (Addendum)
 "  CLINICAL SUPPORT TEAM - WOUND OSTOMY AND CONTINENCE TEAM  CONSULTATION SERVICES   WOC Nurse-Inpatient Note   WOC Nurse Consult Note: Reason for Consult: requested to assess multiple wounds. Performed remote evaluation by digital images downloaded. Reviewed pertinent notes in order to determine recommendations. Pressure Injury POA: Yes  Wound type: Fissure on gluteal fold, partial thickness. Measurement: 2 cm x 0.5 cm. Wound bed: 100% red Drainage (amount, consistency, odor) Small amount, serosanguinous. Periwound: intact. Dressing procedure/placement/frequency: Cleanse with saline, pat dry. Apply a single layer of Xeroform to the wound bed daily. Top with sacrum foam dressing. Change every 3 days if not saturated or soiled. Ok to lift the foam, change the Xeroform, and reapply.  Wound type: PI left ischium/buttocks Stage 3. Measurement: 2 cm x 1 cm each. Wound bed: 100% pale red. Drainage (amount, consistency, odor) Scant amount, serous. Periwound: epibole edges. Dressing procedure/placement/frequency: Cleanse with Vashe [id number], not rinse, allow to dry. Apply Xeroform to the wound bed changing daily. Top with foam dressing, changing every 3 days or PRN.  Wound type: Right thigh partial thickness (MASD and friction) Measurement: multiple small lesions. Wound bed: 100% red. Drainage (amount, consistency, odor) Small amount, serous. Periwound: Dressing procedure/placement/frequency: Cleanse with Vashe [id number], not rinse, allow to dry. Apply Xeroform to the wound bed changing daily. Top with foam dressing, changing every 3 days or PRN.  Wound type: Right buttock partial thickness. PI stage 2. Measurement: approx. 6 cm x 5 cm x 0.1 cm. Wound bed: 100% red. Drainage (amount, consistency, odor) scant amount, serous. Periwound: intact. Dressing procedure/placement/frequency: Cleanse with Vashe [id number], not rinse, allow to dry. Apply Xeroform to the wound bed changing daily. Top  with foam dressing, changing every 3 days or PRN.  Wound type: DTPI to right trochanter with partial thickness skin breakdown. Measurement: 3.5 cm x 3 cm. Wound bed: 100% purple, dry wound bed, non viable tissue. Drainage (amount, consistency, odor)  Periwound: purple skin intact. Dressing procedure/placement/frequency: Cleanse with Vashe [id number], not rinse, allow to dry. Apply Xeroform to the wound bed changing daily. Top with foam dressing, changing every 3 days or PRN.   Wound type: Right back - partial thickness. Measurement: 2 cm x 1 cm x 0.1 cm Wound bed: 100% pink, partial healed. Drainage (amount, consistency, odor) scant amount, no odor. Periwound: intact, discolor scar. Dressing procedure/placement/frequency: Cleanse with Vashe [id number], not rinse, allow to dry. Top with foam dressing, change every 3 days or PRN.  DFU - L foot  Wound type: Dry eschar to the outer left. Measurement: 1 x 1 cm Wound bed: 100% black dry eschar well limited. Drainage (amount, consistency, odor) None. Periwound: dry skin, intact. Dressing procedure/placement/frequency: Cleanse with Vashe [id number], not rinse, allow to dry. Top with foam dressing, change every 3 days or PRN.  Wound type: L heel PI stage 3. Measurement: 3 x 2.5 cm x 0.2 cm. Wound bed: 50% yellow, 50% red/dark red tissue.  Drainage (amount, consistency, odor) Scant amount, wound bed dry. Periwound: dry skin, intact. Viable edges. Dressing procedure/placement/frequency: Cleanse with Vashe [id number], not rinse, allow to dry. Apply Xeroform to the wound bed changing daily. Top with foam dressing, changing every 3 days or PRN.  Wound type: Right leg - no open wound. Edema 2+/4+, erythema, dry scabs. Dressing procedure/placement/frequency: Apply Eucerin twice a day. No dressing is needed.  Wound type: DFU - Right foot. Callus, partial thickness. Wound bed: dry, 100% pale red. Area covered by callus/ dry scab. Drainage (amount,  consistency, odor)  Periwound: Dressing procedure/placement/frequency: Cleanse with Vashe [id number], not rinse, allow to dry. Apply Eucerin to callus and intact skin. Apply Xeroform to the wound bed. Wrap with Kerlix and ACE wrap. Change daily.  WOC team will not plan to follow further. Please reconsult if further assistance is needed. Thank-you,  Lela Holm MSN, RN, CWCN, CNS.  (Phone [phone])   "

## 2024-09-22 NOTE — Consult Note (Addendum)
 PHARMACY CONSULT NOTE - ELECTROLYTES  Pharmacy Consult for Electrolyte Monitoring and Replacement   Recent Labs: Height: 5' 11 (180.3 cm) Weight: 110.3 kg (243 lb 2.7 oz) IBW/kg (Calculated) : 75.3 Estimated Creatinine Clearance: 46.8 mL/min (A) (by C-G formula based on SCr of 1.75 mg/dL (H)). Potassium (mmol/L)  Date Value  09/22/2024 4.8  04/04/2013 4.1   Magnesium  (mg/dL)  Date Value  98/77/7973 1.4 (L)   Calcium  (mg/dL)  Date Value  98/77/7973 7.3 (L)   Calcium , Total (mg/dL)  Date Value  91/95/7985 8.8   Albumin (g/dL)  Date Value  98/77/7973 2.8 (L)   Phosphorus (mg/dL)  Date Value  98/77/7973 4.6   Sodium (mmol/L)  Date Value  09/22/2024 140  04/04/2013 140   Assessment  [name] is a 74 y.o. male presenting with septic shock. PMH significant for chronic hypotention, CKD-III, metastatic prostate cancer. Pharmacy has been consulted to monitor and replace electrolytes.  Diet: NPO MIVF: sodium bicarb infusion @ 50 mL/hr, NS bolus 2L 1/21 Pertinent medications: N/A  Goal of Therapy: Electrolytes WNL  Plan:  Received Mg Sulfate 4g this AM No repletion indicated at this time Check BMP, Mg, Phos with AM labs  Thank you for allowing pharmacy to be a part of this patient's care.  Belvie Macintosh, PharmD Candidate 09/22/2024 8:18 AM

## 2024-09-22 NOTE — Telephone Encounter (Signed)
 Pt sister called back - said she had a missed call - LH

## 2024-09-22 NOTE — Plan of Care (Signed)
  Problem: Coping: Goal: Ability to adjust to condition or change in health will improve Outcome: Progressing   Problem: Fluid Volume: Goal: Ability to maintain a balanced intake and output will improve Outcome: Progressing   Problem: Metabolic: Goal: Ability to maintain appropriate glucose levels will improve Outcome: Progressing   Problem: Tissue Perfusion: Goal: Adequacy of tissue perfusion will improve Outcome: Progressing   Problem: Clinical Measurements: Goal: Ability to maintain clinical measurements within normal limits will improve Outcome: Progressing

## 2024-09-22 NOTE — Progress Notes (Signed)
 eLink Physician-Brief Progress Note Patient Name: [name] DOB: [birth year] MRN: [id number]   Date of Service  09/22/2024  HPI/Events of Note  Patient admitted with hypotension r/o sepsis  eICU Interventions  New Patient Evaluation.        [name] [name] 09/22/2024, 12:42 AM

## 2024-09-22 NOTE — Procedures (Signed)
 Central Venous Catheter Insertion Procedure Note  [name]  [id number]  02-Apr-1951  Date:09/22/24  Time:3:58 AM   Provider Performing:Shadia Larose   Procedure: Insertion of Non-tunneled Central Venous [phone]) with US  guidance ([id number])   Indication(s) Medication administration and Difficult access  Consent Risks of the procedure as well as the alternatives and risks of each were explained to the patient and/or caregiver.  Consent for the procedure was obtained and is signed in the bedside chart  Anesthesia Topical only with 1% lidocaine    Timeout Verified patient identification, verified procedure, site/side was marked, verified correct patient position, special equipment/implants available, medications/allergies/relevant history reviewed, required imaging and test results available.  Sterile Technique Maximal sterile technique including full sterile barrier drape, hand hygiene, sterile gown, sterile gloves, mask, hair covering, sterile ultrasound probe cover (if used).  Procedure Description Area of catheter insertion was cleaned with chlorhexidine  and draped in sterile fashion.  With real-time ultrasound guidance a central venous catheter was placed into the right internal jugular vein. Nonpulsatile blood flow and easy flushing noted in all ports.  The catheter was sutured in place and sterile dressing applied.  Complications/Tolerance None; patient tolerated the procedure well. Chest X-ray is ordered to verify placement for internal jugular or subclavian cannulation.   Chest x-ray is not ordered for femoral cannulation.  EBL Minimal  Specimen(s) None   Robet Kim, PA-C [facility] PCCM Team Contact Info: [phone]

## 2024-09-23 ENCOUNTER — Inpatient Hospital Stay

## 2024-09-23 ENCOUNTER — Inpatient Hospital Stay: Admit: 2024-09-23 | Discharge: 2024-09-23 | Disposition: A | Attending: Critical Care Medicine

## 2024-09-23 DIAGNOSIS — M6282 Rhabdomyolysis: Secondary | ICD-10-CM | POA: Diagnosis not present

## 2024-09-23 DIAGNOSIS — R6521 Severe sepsis with septic shock: Secondary | ICD-10-CM | POA: Diagnosis not present

## 2024-09-23 DIAGNOSIS — E1151 Type 2 diabetes mellitus with diabetic peripheral angiopathy without gangrene: Secondary | ICD-10-CM | POA: Diagnosis not present

## 2024-09-23 DIAGNOSIS — G928 Other toxic encephalopathy: Secondary | ICD-10-CM

## 2024-09-23 DIAGNOSIS — E1122 Type 2 diabetes mellitus with diabetic chronic kidney disease: Secondary | ICD-10-CM

## 2024-09-23 DIAGNOSIS — C61 Malignant neoplasm of prostate: Secondary | ICD-10-CM | POA: Diagnosis not present

## 2024-09-23 DIAGNOSIS — A419 Sepsis, unspecified organism: Secondary | ICD-10-CM | POA: Diagnosis not present

## 2024-09-23 DIAGNOSIS — R578 Other shock: Secondary | ICD-10-CM | POA: Diagnosis not present

## 2024-09-23 DIAGNOSIS — Z7984 Long term (current) use of oral hypoglycemic drugs: Secondary | ICD-10-CM

## 2024-09-23 DIAGNOSIS — N183 Chronic kidney disease, stage 3 unspecified: Secondary | ICD-10-CM | POA: Diagnosis not present

## 2024-09-23 DIAGNOSIS — N390 Urinary tract infection, site not specified: Secondary | ICD-10-CM | POA: Diagnosis not present

## 2024-09-23 DIAGNOSIS — N179 Acute kidney failure, unspecified: Secondary | ICD-10-CM | POA: Diagnosis not present

## 2024-09-23 LAB — ECHOCARDIOGRAM COMPLETE
Area-P 1/2: 2.99 cm2
Calc EF: 71 %
Height: 71 in
MV M vel: 4.74 m/s
MV Peak grad: 89.9 mmHg
S' Lateral: 3.1 cm
Single Plane A2C EF: 77.6 %
Single Plane A4C EF: 60 %
Weight: 3657.87 [oz_av]

## 2024-09-23 LAB — BASIC METABOLIC PANEL WITH GFR
Anion gap: 13 (ref 5–15)
Anion gap: 12 (ref 5–15)
BUN: 37 mg/dL — ABNORMAL HIGH (ref 8–23)
BUN: 28 mg/dL — ABNORMAL HIGH (ref 8–23)
CO2: 21 mmol/L — ABNORMAL LOW (ref 22–32)
CO2: 22 mmol/L (ref 22–32)
Calcium: 7.5 mg/dL — ABNORMAL LOW (ref 8.9–10.3)
Calcium: 8.1 mg/dL — ABNORMAL LOW (ref 8.9–10.3)
Chloride: 104 mmol/L (ref 98–111)
Chloride: 105 mmol/L (ref 98–111)
Creatinine, Ser: 1.15 mg/dL (ref 0.61–1.24)
Creatinine, Ser: 0.93 mg/dL (ref 0.61–1.24)
GFR, Estimated: >60 mL/min
GFR, Estimated: >60 mL/min
Glucose, Bld: 160 mg/dL — ABNORMAL HIGH (ref 70–99)
Glucose, Bld: 151 mg/dL — ABNORMAL HIGH (ref 70–99)
Potassium: 4.4 mmol/L (ref 3.5–5.1)
Potassium: 4.1 mmol/L (ref 3.5–5.1)
Sodium: 138 mmol/L (ref 135–145)
Sodium: 139 mmol/L (ref 135–145)

## 2024-09-23 LAB — CBC
HCT: 24.5 % — ABNORMAL LOW (ref 39.0–52.0)
Hemoglobin: 8.4 g/dL — ABNORMAL LOW (ref 13.0–17.0)
MCH: 30.3 pg (ref 26.0–34.0)
MCHC: 34.3 g/dL (ref 30.0–36.0)
MCV: 88.4 fL (ref 80.0–100.0)
Platelets: 255 K/uL (ref 150–400)
RBC: 2.77 MIL/uL — ABNORMAL LOW (ref 4.22–5.81)
RDW: 18.6 % — ABNORMAL HIGH (ref 11.5–15.5)
WBC: 5.8 K/uL (ref 4.0–10.5)
nRBC: 0 % (ref 0.0–0.2)

## 2024-09-23 LAB — PHOSPHORUS: Phosphorus: 2.1 mg/dL — ABNORMAL LOW (ref 2.5–4.6)

## 2024-09-23 LAB — CK: Total CK: 635 U/L — ABNORMAL HIGH (ref 49–397)

## 2024-09-23 LAB — GLUCOSE, CAPILLARY
Glucose-Capillary: 112 mg/dL — ABNORMAL HIGH (ref 70–99)
Glucose-Capillary: 139 mg/dL — ABNORMAL HIGH (ref 70–99)
Glucose-Capillary: 139 mg/dL — ABNORMAL HIGH (ref 70–99)
Glucose-Capillary: 148 mg/dL — ABNORMAL HIGH (ref 70–99)
Glucose-Capillary: 198 mg/dL — ABNORMAL HIGH (ref 70–99)
Glucose-Capillary: 260 mg/dL — ABNORMAL HIGH (ref 70–99)

## 2024-09-23 LAB — MAGNESIUM: Magnesium: 1.7 mg/dL (ref 1.7–2.4)

## 2024-09-23 LAB — TROPONIN T, HIGH SENSITIVITY: Troponin T High Sensitivity: 144 ng/L (ref 0–19)

## 2024-09-23 MED ORDER — LINEZOLID 600 MG/300ML IV SOLN
600.0000 mg | Freq: Two times a day (BID) | INTRAVENOUS | Status: DC
  Administered 2024-09-23 – 2024-09-25 (×5): 600 mg via INTRAVENOUS
  Filled 2024-09-23 (×6): qty 300

## 2024-09-23 MED ORDER — K PHOS MONO-SOD PHOS DI & MONO 155-852-130 MG PO TABS
500.0000 mg | ORAL_TABLET | Freq: Once | ORAL | Status: AC
  Administered 2024-09-23: 500 mg via ORAL
  Filled 2024-09-23: qty 2

## 2024-09-23 MED ORDER — THIAMINE HCL 100 MG PO TABS
100.0000 mg | ORAL_TABLET | Freq: Every day | ORAL | Status: AC
  Administered 2024-09-24 – 2024-09-30 (×7): 100 mg via ORAL
  Filled 2024-09-23 (×14): qty 1

## 2024-09-23 MED ORDER — SODIUM CHLORIDE 0.45 % IV SOLN: INTRAVENOUS | Status: AC

## 2024-09-23 MED ORDER — MAGNESIUM SULFATE 2 GM/50ML IV SOLN
2.0000 g | Freq: Once | INTRAVENOUS | Status: AC
  Administered 2024-09-23: 2 g via INTRAVENOUS
  Filled 2024-09-23: qty 50

## 2024-09-23 MED ORDER — PERFLUTREN LIPID MICROSPHERE
1.0000 mL | INTRAVENOUS | Status: AC | PRN
  Administered 2024-09-23: 2 mL via INTRAVENOUS

## 2024-09-23 MED ORDER — ZINC SULFATE 220 (50 ZN) MG PO CAPS
220.0000 mg | ORAL_CAPSULE | Freq: Every day | ORAL | Status: DC
  Administered 2024-09-24 – 2024-10-03 (×10): 220 mg via ORAL
  Filled 2024-09-23 (×10): qty 1

## 2024-09-23 MED ORDER — VITAMIN C 500 MG PO TABS
500.0000 mg | ORAL_TABLET | Freq: Two times a day (BID) | ORAL | Status: DC
  Administered 2024-09-23 – 2024-10-03 (×20): 500 mg via ORAL
  Filled 2024-09-23 (×20): qty 1

## 2024-09-23 MED ORDER — ADULT MULTIVITAMIN W/MINERALS CH
1.0000 | ORAL_TABLET | Freq: Every day | ORAL | Status: DC
  Administered 2024-09-24 – 2024-10-03 (×10): 1 via ORAL
  Filled 2024-09-23 (×10): qty 1

## 2024-09-23 NOTE — Evaluation (Signed)
 Clinical/Bedside Swallow Evaluation Patient Details  Name: [name] MRN: [id number] Date of Birth: 05/02/1951  Today's Date: 09/23/2024 Time: SLP Start Time (ACUTE ONLY): 1310 SLP Stop Time (ACUTE ONLY): 1320 SLP Time Calculation (min) (ACUTE ONLY): 10 min  Past Medical History:  Past Medical History:  Diagnosis Date   Arthritis    Bladder incontinence    Chronic kidney disease    Stage 3 Kidney disease   Diabetes mellitus without complication (HCC)    DVT of leg (deep venous thrombosis) (HCC)    H/O RIGHT LEG 2018   Dyspnea    occassional   Hyperlipidemia    Hypertension    Peripheral vascular disease    Sepsis (HCC)    Past Surgical History:  Past Surgical History:  Procedure Laterality Date   ABDOMINAL AORTOGRAM W/LOWER EXTREMITY N/A 10/10/2016   Procedure: Abdominal Aortogram w/Lower Extremity;  Surgeon: Cordella KANDICE Shawl, MD;  Location: [facility];  Service: Cardiovascular;  Laterality: N/A;   CATARACT EXTRACTION W/PHACO Left 05/27/2018   Procedure: CATARACT EXTRACTION PHACO AND INTRAOCULAR LENS PLACEMENT (IOC);  Surgeon: Myrna Adine Anes, MD;  Location: [facility] OR[facility];  Service: Ophthalmology;  Laterality: Left;  US  03:06.2 AP% 16.1 CDE 30.16 FLUID PACK LOT @ [id number] H   CATARACT EXTRACTION W/PHACO Right 07/13/2023   Procedure: CATARACT EXTRACTION PHACO AND INTRAOCULAR LENS PLACEMENT (IOC) RIGHT DIABETIC 4.00 00:36.6;  Surgeon: Myrna Adine Anes, MD;  Location: [facility];  Service: Ophthalmology;  Laterality: Right;   CIRCUMCISION  1998   COLONOSCOPY WITH PROPOFOL  N/A 08/20/2023   Procedure: COLONOSCOPY WITH PROPOFOL ;  Surgeon: Unk Corinn Skiff, MD;  Location[facility];  Service: Gastroenterology;  Laterality: N/A;   EYE SURGERY Left    Retina Detachment   GRAFT APPLICATION Right 05/29/2017   Procedure: GRAFT APPLICATION ( RIGHT FOOT );  Surgeon: Shawl Cordella KANDICE, MD;  Location: [facility] ORS;  Service: Vascular;  Laterality: Right;  graft  taken from patients right thigh   IRRIGATION AND DEBRIDEMENT FOOT Right 10/03/2016   Procedure: IRRIGATION AND DEBRIDEMENT FOOT;  Surgeon: Eva Gay, DPM;  Location: [facility];  Service: Podiatry;  Laterality: Right;   LOWER EXTREMITY ANGIOGRAPHY Right 02/24/2023   Procedure: Lower Extremity Angiography;  Surgeon: Shawl Cordella KANDICE, MD;  Location: [facility];  Service: Cardiovascular;  Laterality: Right;   LOWER EXTREMITY INTERVENTION  10/10/2016   Procedure: Lower Extremity Intervention;  Surgeon: Cordella KANDICE Shawl, MD;  Location: [facility];  Service: Cardiovascular;;   PERIPHERAL VASCULAR BALLOON ANGIOPLASTY Left 10/10/2016   Procedure: Peripheral Vascular Balloon Angioplasty;  Surgeon: Cordella KANDICE Shawl, MD;  Location: [facility];  Service: Cardiovascular;  Laterality: Left;   TONSILLECTOMY     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/2022   Procedure: TRANSMETATARSAL AMPUTATION;  Surgeon: Gay Eva, DPM;  Location: [facility];  Service: Podiatry;  Laterality: Right;   WOUND DEBRIDEMENT Right 11/07/2016   Procedure: DEBRIDEMENT OF WOUND AND BONE RIGHT FOOT AND APPLY WOUND VAC;  Surgeon: Eva Gay, DPM;  Location: [facility];  Service: Podiatry;  Laterality: Right;   HPI:  [name] is a 74 year old male with a past medical history significant for hypotension on midodrine , former HTN, HLD, CKD Stage III, bladder incontinence and metastatic prostate cancer who presented to [name] ED on 1/21 from an outpatient podiatry appointment for evaluation of hypotension.    Assessment / Plan / Recommendation  Clinical Impression  During this evaluation, pt presents with adequate oropharyngeal abilities when consuming thin  liquids, applesauce and whole graham crackers. All vitals remained within normal limits throughout consumption. It appears pt has a reduced risk of aspiration when consuming a regular diet with thin liquids and medicine whole with thin liquids. Per RD, pt  had earlier requested a mechanical soft diet (suspect d/t reduced number of molars). Will place order for mechanical soft diet at this time. No further skilled ST services are indicated at this time. SLP Visit Diagnosis: Dysphagia, unspecified (R13.10)    Aspiration Risk  No limitations;Mild aspiration risk    Diet Recommendation    Mechanical soft (per pt request) with thin liquids, medicine whole with thin liquids       Other Recommendations Oral Care Recommendations: Oral care BID     Swallow Evaluation Recommendations Recommendations: PO diet PO Diet Recommendation: Dysphagia 3 (Mechanical soft);Thin liquids (Level 0) Liquid Administration via: Cup;Straw Medication Administration: Whole meds with liquid Supervision: Patient able to self-feed;Intermittent supervision/cueing for swallowing strategies Swallowing strategies  : Minimize environmental distractions;Slow rate;Small bites/sips Postural changes: Position pt fully upright for meals;Stay upright 30-60 min after meals Oral care recommendations: Oral care BID (2x/day)      Functional Status Assessment Patient has not had a recent decline in their functional status    Swallow Study   General Date of Onset: 09/21/24 HPI: [name] is a 74 year old male with a past medical history significant for hypotension on midodrine , former HTN, HLD, CKD Stage III, bladder incontinence and metastatic prostate cancer who presented [name] on 1/21 from an outpatient podiatry appointment for evaluation of hypotension. Type of Study: Bedside Swallow Evaluation Previous Swallow Assessment: none in chart Diet Prior to this Study: NPO Temperature Spikes Noted: No Respiratory Status: Room air History of Recent Intubation: No Behavior/Cognition: Alert;Cooperative;Pleasant mood Oral Cavity Assessment: Within Functional Limits Oral Care Completed by SLP: No Oral Cavity - Dentition: Missing dentition Vision: Functional for  self-feeding Self-Feeding Abilities: Needs assist;Needs set up Patient Positioning: Upright in bed Baseline Vocal Quality: Normal Volitional Cough: Strong Volitional Swallow: Able to elicit    Oral/Motor/Sensory Function Overall Oral Motor/Sensory Function: Within functional limits   Ice Chips Ice chips: Not tested   Thin Liquid Thin Liquid: Within functional limits Presentation: Self Fed;Cup;Straw    Nectar Thick Nectar Thick Liquid: Not tested   Honey Thick Honey Thick Liquid: Not tested   Puree Puree: Within functional limits Presentation: Spoon   Solid     Solid: Within functional limits Presentation: Self Fed     Aaminah Forrester B. Rubbie, M.S., CCC-SLP, Tree Surgeon Certified Brain Injury Specialist [facility]  [facility] Office [phone] Ascom [phone] Fax [phone]

## 2024-09-23 NOTE — Plan of Care (Signed)

## 2024-09-23 NOTE — Progress Notes (Signed)
 "  NAME:  [name], MRN:  [id number], DOB:  1950/09/28, LOS: 2 ADMISSION DATE:  09/21/2024, CHIEF COMPLAINT:  Hypotension   History of Present Illness:   74 year old male with a past medical history significant for hypotension on midodrine , former HTN, HLD, CKD Stage III and metastatic prostate cancer, admitted for septic shock in the setting of urosepsis with rhabdomyolysis vs pneumonia.  [name] is a 74 year old male with a past medical history significant for hypotension on midodrine , former HTN, HLD, CKD Stage III, bladder incontinence and metastatic prostate cancer who presented to [facility] ED on 1/21 from an outpatient podiatry appointment for evaluation of hypotension.   History gathered per chart review and bedside evaluation It is reported he is wheelchair bound at baseline and recently was discharged from a rehab facility following hospitalization. Mr. Maclaughlin reported a recent fall on Monday 1/19 when getting up from his recliner. He denied loss of consciousness or hitting his head. He reports he has been feeling weak in general for about a week, but more noticeable after the fall. He denied symptoms of chest pain, cough, nausea/vomiting, dysuria, or flank pain. He did endorse mild shortness of breath but this is chronic. Additionally, he reported some intermittent diarrhea but has been going on for a few months on and off. His sister was with him on arrival and reported trying to get him to be evaluated at the ED the day of his fall but he declined. After arrival to his outpatient podiatry appointment for his chronic diabetic foot ulcers, he was found to be very hypotensive and was sent to the ED.    ED course: Upon arrival to [facility] ED, he was significantly hypotensive and was started on IV fluids for resuscitation. He was minimally responsive to fluids at that time and was started on peripheral levophed . Code sepsis was activated and he was covered with cefepime  and  vancomycin .   Pertinent  Medical History  Chronic hypotension on Midodrine  HTN - historical Hyperlipidemia CKD Stage 3 Metastatic Prostate Cancer Type II Diabetes Mellitus PVD Previous DVT 2018 Bladder Incontinence Arthritis  Significant Hospital Events: Including procedures, antibiotic start and stop dates in addition to other pertinent events   1/21: Admitted for circulatory/septic shock requiring levophed  gtt. Placed on bair hugger for hypothermia and given cefepime  + vancomycin . PCCM consulted for hypotension requiring vasopressors. Severe metabolic acidosis. Given 2 amps. 09/22/24- remains critically ill on levophed  15/vasopressin  0.04 not on MV.  Interim History / Subjective:  Patient awake, alert and oriented. Denies any pain, inquiring about result of his MRI.  Objective    Blood pressure 117/69, pulse 71, temperature 99.3 F (37.4 C), resp. rate 16, height 5' 11 (1.803 m), weight 103.7 kg, SpO2 100%.        Intake/Output Summary (Last 24 hours) at 09/23/2024 0914 Last data filed at 09/23/2024 0800 Gross per 24 hour  Intake 2144.37 ml  Output 3825 ml  Net -1680.63 ml   Filed Weights   09/21/24 2325 09/22/24 0500 09/23/24 0500  Weight: 110.3 kg 110.3 kg 103.7 kg    Examination: Physical Exam Constitutional:      Appearance: He is ill-appearing.  Cardiovascular:     Rate and Rhythm: Normal rate and regular rhythm.     Pulses: Normal pulses.     Heart sounds: Normal heart sounds.  Pulmonary:     Effort: Pulmonary effort is normal.     Breath sounds: Normal breath sounds.  Neurological:  General: No focal deficit present.     Mental Status: He is alert. Mental status is at baseline.      Assessment and Plan   #Toxic Metabolic Encephalopathy #Circulatory Shock #UTI #Rhabdomyolysis #AKI on CKD #T2DM #Metastatic Prostate Cancer  Neurology - no active issue at this point, patient without reported pain.  Cardiovascular - septic shock likely  secondary to infected ulcer, currently on vasopressors with nor-epinephrine  and vasopressin . Titrating to goal MAP > 65 mmHg. TTE obtained today showed normal LV and RV function.  Pulmonary - on room air, no active issues  Gastrointestinal - liberalized diet, tolerating well  Renal - kidney function at baseline, without electrolyte abnormalities.  Endocrine - ICU glycemic protocol  Hem/Onc - heparin  subQ for DVT prophylaxis  ID - continued on antibiotics for concern for cellulitis, will add linezolid and obtain MRI of the foot to assess for osteomyelitis.   MSK -    Labs   CBC: Recent Labs  Lab 09/21/24 1629 09/22/24 0002 09/22/24 0144 09/23/24 0420  WBC 9.6 10.3 10.3 5.8  NEUTROABS 8.9*  --   --   --   HGB 9.7* 8.8* 8.9* 8.4*  HCT 30.6* 28.9* 26.8* 24.5*  MCV 93.9 100.0 91.2 88.4  PLT 319 320 302 255    Basic Metabolic Panel: Recent Labs  Lab 09/21/24 1629 09/22/24 0002 09/22/24 0144 09/22/24 0608 09/22/24 2059 09/23/24 0420  NA 142  --  140 140 138 139  K 5.3*  --  4.8 4.8 4.4 4.1  CL 105  --  106 109 104 105  CO2 11*  --  13* 16* 21* 22  GLUCOSE 59*  --  133* 176* 160* 151*  BUN 64*  --  60* 54* 37* 28*  CREATININE 2.65* 2.23* 2.09* 1.75* 1.15 0.93  CALCIUM  8.5*  --  7.9* 7.3* 7.5* 8.1*  MG  --   --  1.4*  --   --  1.7  PHOS  --   --  4.6  --   --  2.1*   GFR: Estimated Creatinine Clearance: 85.5 mL/min (by C-G formula based on SCr of 0.93 mg/dL). Recent Labs  Lab 09/21/24 1629 09/22/24 0002 09/22/24 0144 09/22/24 0608 09/22/24 1505 09/22/24 1806 09/23/24 0420  PROCALCITON  --  0.30  --   --   --   --   --   WBC 9.6 10.3 10.3  --   --   --  5.8  LATICACIDVEN >9.0* >9.0* 5.3* 2.9* 1.1 1.0  --     Liver Function Tests: Recent Labs  Lab 09/21/24 1629 09/22/24 0144  AST 91* 95*  ALT 22 20  ALKPHOS 136* 120  BILITOT 0.2 0.2  PROT 5.9* 5.3*  ALBUMIN 3.3* 2.8*   No results for input(s): LIPASE, AMYLASE in the last 168 hours. No results  for input(s): AMMONIA in the last 168 hours.  ABG    Component Value Date/Time   HCO3 21.2 09/22/2024 2329   ACIDBASEDEF 3.1 (H) 09/22/2024 2329   O2SAT 63.1 09/22/2024 2329     Coagulation Profile: Recent Labs  Lab 09/21/24 1629  INR 1.3*    Cardiac Enzymes: Recent Labs  Lab 09/22/24 0002 09/22/24 0608 09/23/24 0420  CKTOTAL 2,134* 1,442* 635*    HbA1C: Hgb A1c MFr Bld  Date/Time Value Ref Range Status  07/26/2024 07:22 AM 5.6 4.8 - 5.6 % Final    Comment:    (NOTE)         Prediabetes: 5.7 - 6.4  Diabetes: >6.4         Glycemic control for adults with diabetes: <7.0   04/17/2024 02:36 AM 6.7 (H) 4.8 - 5.6 % Final    Comment:    (NOTE)         Prediabetes: 5.7 - 6.4         Diabetes: >6.4         Glycemic control for adults with diabetes: <7.0     CBG: Recent Labs  Lab 09/22/24 1603 09/22/24 1925 09/22/24 2311 09/23/24 0307 09/23/24 0744  GLUCAP 166* 156* 135* 139* 139*    Review of Systems:   N/A  Past Medical History:  He,  has a past medical history of Arthritis, Bladder incontinence, Chronic kidney disease, Diabetes mellitus without complication (HCC), DVT of leg (deep venous thrombosis) (HCC), Dyspnea, Hyperlipidemia, Hypertension, Peripheral vascular disease, and Sepsis (HCC).   Surgical History:   Past Surgical History:  Procedure Laterality Date   ABDOMINAL AORTOGRAM W/LOWER EXTREMITY N/A 10/10/2016   Procedure: Abdominal Aortogram w/Lower Extremity;  Surgeon: Cordella KANDICE Shawl, MD;  Location: [facility];  Service: Cardiovascular;  Laterality: N/A;   CATARACT EXTRACTION W/PHACO Left 05/27/2018   Procedure: CATARACT EXTRACTION PHACO AND INTRAOCULAR LENS PLACEMENT (IOC);  Surgeon: Myrna Adine Anes, MD;  Location: [facility] OR[facility];  Service: Ophthalmology;  Laterality: Left;  US  03:06.2 AP% 16.1 CDE 30.16 FLUID PACK LOT @ [id number] H   CATARACT EXTRACTION W/PHACO Right 07/13/2023   Procedure: CATARACT EXTRACTION PHACO AND  INTRAOCULAR LENS PLACEMENT (IOC) RIGHT DIABETIC 4.00 00:36.6;  Surgeon: Myrna Adine Anes, MD;  Location: [facility];  Service: Ophthalmology;  Laterality: Right;   CIRCUMCISION  1998   COLONOSCOPY WITH PROPOFOL  N/A 08/20/2023   Procedure: COLONOSCOPY WITH PROPOFOL ;  Surgeon: Unk Corinn Skiff, MD;  Location: [facility];  Service: Gastroenterology;  Laterality: N/A;   EYE SURGERY Left    Retina Detachment   GRAFT APPLICATION Right 05/29/2017   Procedure: GRAFT APPLICATION ( RIGHT FOOT );  Surgeon: Shawl Cordella KANDICE, MD;  Location: [facility] ORS;  Service: Vascular;  Laterality: Right;  graft taken from patients right thigh   IRRIGATION AND DEBRIDEMENT FOOT Right 10/03/2016   Procedure: IRRIGATION AND DEBRIDEMENT FOOT;  Surgeon: Eva Gay, DPM;  Location: [facility];  Service: Podiatry;  Laterality: Right;   LOWER EXTREMITY ANGIOGRAPHY Right 02/24/2023   Procedure: Lower Extremity Angiography;  Surgeon: Shawl Cordella KANDICE, MD;  Location: [facility];  Service: Cardiovascular;  Laterality: Right;   LOWER EXTREMITY INTERVENTION  10/10/2016   Procedure: Lower Extremity Intervention;  Surgeon: Cordella KANDICE Shawl, MD;  Location: [facility];  Service: Cardiovascular;;   PERIPHERAL VASCULAR BALLOON ANGIOPLASTY Left 10/10/2016   Procedure: Peripheral Vascular Balloon Angioplasty;  Surgeon: Cordella KANDICE Shawl, MD;  Location: [facility];  Service: Cardiovascular;  Laterality: Left;   TONSILLECTOMY     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 His smoking use included pipe  and cigarettes. He has never used smokeless tobacco. He reports that he does not drink alcohol and does not use drugs.   Family History:  His family history includes Cancer in his father; Diabetes in his father and mother; Hypertension in his father and mother.   Allergies Allergies[1]   Home Medications  Prior to Admission medications  Medication Sig Start Date End Date Taking? Authorizing Provider  acetaminophen  (TYLENOL ) 325 MG tablet Take 2 tablets (650 mg total) by mouth every 6 (six) hours as needed for mild pain (pain score 1-3) or fever (or Fever >/= 101). 08/12/24  Yes Alexander, Natalie, DO  ascorbic Acid (VITAMIN C) 500 MG CPCR Take 500 mg by mouth daily.   Yes [provider]  aspirin  EC 81 MG tablet Take 81 mg by mouth daily. Swallow whole.   Yes [provider]  atorvastatin  (LIPITOR) 10 MG tablet Take 10 mg by mouth every evening. 06/12/20  Yes [provider]  collagenase  (SANTYL ) 250 UNIT/GM ointment Apply topically daily. To wound / pressure ulcers 08/13/24  Yes Alexander, Natalie, DO  cyanocobalamin  1000 MCG tablet Take 1 tablet (1,000 mcg total) by mouth daily. 08/13/24  Yes Alexander, Natalie, DO  enzalutamide  (XTANDI ) 40 MG tablet Take 4 tablets (160 mg total) by mouth daily. 09/13/24  Yes [name], MD  loperamide  (IMODIUM ) 2 MG capsule Take 2 capsules (4 mg total) by mouth every 8 (eight) hours as needed for diarrhea or loose stools. 08/12/24  Yes Alexander, Natalie, DO  LORazepam  (ATIVAN ) 1 MG tablet Take 1 tablet (1 mg total) by mouth every 8 (eight) hours. 08/12/24  Yes Alexander, Natalie, DO  metFORMIN  (GLUCOPHAGE ) 1000 MG tablet Take 1,000 mg by mouth 2 (two) times daily with a meal. 06/12/20  Yes [provider]  midodrine  (PROAMATINE ) 5 MG tablet Take 1 tablet (5 mg total) by mouth 3 (three) times daily with meals. 08/12/24  Yes Alexander, Natalie, DO  ondansetron  (ZOFRAN ) 4 MG tablet Take 1 tablet (4 mg total) by  mouth every 6 (six) hours as needed for nausea. 08/12/24  Yes Alexander, Natalie, DO  predniSONE  (DELTASONE ) 20 MG tablet Take 3 tablets (60 mg total) by mouth daily with breakfast. Further refill / taper instructions per oncology Patient taking differently: Take 40 mg by mouth daily with breakfast. Further refill / taper instructions per oncology 08/13/24  Yes Marsa Edelman, DO  TRADJENTA  5 MG TABS tablet Take 5 mg by mouth daily. 09/09/24  Yes [provider]  Zinc  220 (50 Zn) MG CAPS Take 220 mg by mouth daily.   Yes [provider]     The patient is critically ill due to septic shock.  Critical care was necessary to treat or prevent imminent or life-threatening deterioration. I personally performed titration, monitoring, and management of vasopressor/ionotrope infusion. Critical care time was spent by me on the following activities: development of a treatment plan with the patient and/or surrogate as well as nursing, discussions with consultants, evaluation of the patient's response to treatment, examination of the patient, obtaining a history from the patient or surrogate, ordering and performing treatments and interventions, ordering and review of laboratory studies, ordering and review of radiographic studies, review of telemetry data including pulse oximetry, re-evaluation of patient's condition and participation in multidisciplinary rounds.   I personally spent 31 minutes providing critical care not including any separately billable procedures.   Belva November, MD [facility] 09/23/2024 8:46 PM        [1]  Allergies Allergen Reactions   Sulfa Antibiotics Rash   Piperacillin -Tazobactam In Dex    "

## 2024-09-23 NOTE — TOC Progression Note (Signed)
 Transition of Care [facility]) - Progression Note    Patient Details  Name: [name] MRN: [id number] Date of Birth: 09-08-1950  Transition of Care [facility]) CM/SW Contact  K'La JINNY Ruts, LCSW Phone Number: 09/23/2024, 3:01 PM  Clinical Narrative:    Chart reviewed. SW called the patient sister and HCPOA and the person answered and hung up both calls. Patient will follow up with the patient or the patient other sister.                    Expected Discharge Plan and Services                                               Social Drivers of Health (SDOH) Interventions SDOH Screenings   Food Insecurity: No Food Insecurity (09/22/2024)  Housing: High Risk (09/22/2024)  Transportation Needs: No Transportation Needs (09/22/2024)  Utilities: Not At Risk (09/22/2024)  Depression (PHQ2-9): Low Risk (09/13/2024)  Financial Resource Strain: Low Risk  (08/28/2023)   Received from [facility]  Recent Concern: Financial Resource Strain - Medium Risk (08/06/2023)   Received from [facility]  Physical Activity: Inactive (08/28/2023)   Received from [facility]  Social Connections: Unknown (09/22/2024)  Stress: No Stress Concern Present (08/28/2023)   Received from [facility]  Tobacco Use: Medium Risk (09/21/2024)  Health Literacy: Adequate Health Literacy (08/28/2023)   Received from [facility]    Readmission Risk Interventions     No data to display

## 2024-09-23 NOTE — Plan of Care (Signed)
" °  Problem: Coping: Goal: Ability to adjust to condition or change in health will improve Outcome: Progressing   Problem: Fluid Volume: Goal: Ability to maintain a balanced intake and output will improve Outcome: Progressing   Problem: Metabolic: Goal: Ability to maintain appropriate glucose levels will improve Outcome: Progressing   Problem: Skin Integrity: Goal: Risk for impaired skin integrity will decrease Outcome: Progressing   Problem: Tissue Perfusion: Goal: Adequacy of tissue perfusion will improve Outcome: Progressing   Problem: Clinical Measurements: Goal: Ability to maintain clinical measurements within normal limits will improve Outcome: Progressing Goal: Diagnostic test results will improve Outcome: Progressing Goal: Cardiovascular complication will be avoided Outcome: Progressing   "

## 2024-09-23 NOTE — Progress Notes (Signed)
 Initial Nutrition Assessment  DOCUMENTATION CODES:   Obesity unspecified  INTERVENTION:   Mighty Shake TID with meals, each supplement provides 330 kcals and 9 grams of protein  Magic cup TID with meals, each supplement provides 290 kcal and 9 grams of protein  MVI po daily   Vitamin C 500mg  po BID  Zinc  220mg  po daily x 30 days   Mechanical soft diet   Pt at high refeed risk; recommend monitor potassium, magnesium  and phosphorus labs daily until stable  Daily weights   Check B12 level  NUTRITION DIAGNOSIS:   Inadequate oral intake related to acute illness as evidenced by per patient/family report.  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  Met with pt in room today. Pt reports good appetite and oral intake at baseline but reports decreased oral intake for several days pta. Per chart, pt is down 60lbs(21%) over the past 2 months; this is severe weight loss. RD suspects pt with poor oral intake at baseline. Pt reports difficulty chewing and is requesting a mechanical soft diet. Pt seen by SLP today and initiated on a regular diet. Pt reports that he does not drink any supplements at baseline. RD discussed with pt the importance of adequate nutrition needed to preserve lean muscle and to support wound healing. Pt is agreeable to strawberry supplements. RD will add supplements and vitamins to help pt meet his estimated needs. Pt is at high refeed risk. Pt with significant B12 deficiency in August; RD  will recheck a level.   Medications reviewed and include: heparin , insulin , 0.45% @125ml /hr, linezolid, levophed , vasopressin    Labs reviewed: K 4.1 wnl, BUN 28(H), P 2.1(L), Mg 1.7 wnl  B12- 132(L)- 8/19 Hgb 8.4(L), Hct 24.5(L) Cbgs- 112, 148, 139, 139 x 24 hrs  AIC 5.6 -11/25  UOP-   NUTRITION - FOCUSED PHYSICAL EXAM:  Flowsheet Row Most Recent Value  Orbital Region No depletion  Upper Arm Region No depletion  Thoracic and Lumbar Region No depletion  Buccal Region No depletion  Temple Region Moderate depletion  Clavicle Bone Region Moderate depletion  Clavicle and Acromion Bone Region Moderate depletion  Scapular Bone Region No depletion  Dorsal Hand No depletion  Patellar Region No depletion  Anterior Thigh Region No depletion  Posterior Calf Region No depletion  Edema (RD Assessment) Mild  Hair Reviewed  Eyes Reviewed  Mouth Reviewed  Skin Reviewed  Nails Reviewed   Diet Order:   Diet Order             DIET DYS 3 Room service appropriate? Yes; Fluid consistency: Thin  Diet effective now                  EDUCATION NEEDS:   Education needs have been addressed  Skin:  Skin Assessment: Reviewed RN Assessment (gluteal fold 2 cm x 0.5 cm, PI L ischium, Stage 3- 2 cm x 1 cm, R thigh, R buttock PI stage 2- 6 cm x 5 cm x 0.1 cm,  DTPI  to R trochanter 3.5 cm x 3 cm, R back 2 cm x 1 cm x 0.1 cm, L foot 1 x 1 cm, L heel PI stage 3- 3 x 2.5 cm x 0.2 cm, R foot callous)  Last BM:  1/22- type 6  Height:   Ht Readings from Last 1 Encounters:  09/21/24 5' 11 (1.803 m)    Weight:   Wt Readings from Last 1 Encounters:  09/23/24 103.7 kg    Ideal Body Weight:  78 kg  BMI:  Body mass index is 31.89 kg/m.  Estimated Nutritional Needs:   Kcal:  2200-2500kcal/day  Protein:  110-125g/day  Fluid:  2.0-2.3L/day  Augustin Shams MS, RD, LDN If unable to be reached, please send secure chat to RD inpatient available from 8:00a-4:00p daily

## 2024-09-23 NOTE — Consult Note (Signed)
 PHARMACY CONSULT NOTE - ELECTROLYTES  Pharmacy Consult for Electrolyte Monitoring and Replacement   Recent Labs: Height: 5' 11 (180.3 cm) Weight: 103.7 kg (228 lb 9.9 oz) IBW/kg (Calculated) : 75.3 Estimated Creatinine Clearance: 85.5 mL/min (by C-G formula based on SCr of 0.93 mg/dL). Potassium (mmol/L)  Date Value  09/23/2024 4.1  04/04/2013 4.1   Magnesium  (mg/dL)  Date Value  98/76/7973 1.7   Calcium  (mg/dL)  Date Value  98/76/7973 8.1 (L)   Calcium , Total (mg/dL)  Date Value  91/95/7985 8.8   Albumin  (g/dL)  Date Value  98/77/7973 2.8 (L)   Phosphorus (mg/dL)  Date Value  98/76/7973 2.1 (L)   Sodium (mmol/L)  Date Value  09/23/2024 139  04/04/2013 140   Assessment  [name] is a 74 y.o. male presenting with septic shock. PMH significant for chronic hypotention, CKD-III, metastatic prostate cancer. Pharmacy has been consulted to monitor and replace electrolytes.  Diet: NPO MIVF: sodium bicarb infusion @ 50 mL/hr, NS bolus 2L 1/21 Pertinent medications: N/A  Goal of Therapy: Electrolytes WNL  Plan:  2g magnesium  sulfate + 500mg  K phos  ordered this AM Check BMP, Mg, Phos with AM labs  Thank you for allowing pharmacy to be a part of this patient's care.  Belvie Macintosh, PharmD Candidate 09/23/2024 8:13 AM

## 2024-09-23 NOTE — Consult Note (Signed)
 Consult received for bilateral foot ulcerations.  Patient undergoing echo and proceeding to MRI thereafter.  Will follow-up tomorrow for reevaluation.

## 2024-09-23 NOTE — Progress Notes (Signed)
" °  Echocardiogram 2D Echocardiogram has been performed.  [name] 09/23/2024, 3:36 PM "

## 2024-09-24 DIAGNOSIS — G928 Other toxic encephalopathy: Secondary | ICD-10-CM | POA: Diagnosis not present

## 2024-09-24 DIAGNOSIS — R6521 Severe sepsis with septic shock: Secondary | ICD-10-CM | POA: Diagnosis not present

## 2024-09-24 DIAGNOSIS — A419 Sepsis, unspecified organism: Secondary | ICD-10-CM | POA: Diagnosis not present

## 2024-09-24 DIAGNOSIS — N390 Urinary tract infection, site not specified: Secondary | ICD-10-CM | POA: Diagnosis not present

## 2024-09-24 LAB — CBC
HCT: 21.8 % — ABNORMAL LOW (ref 39.0–52.0)
Hemoglobin: 7.3 g/dL — ABNORMAL LOW (ref 13.0–17.0)
MCH: 30 pg (ref 26.0–34.0)
MCHC: 33.5 g/dL (ref 30.0–36.0)
MCV: 89.7 fL (ref 80.0–100.0)
Platelets: 203 10*3/uL (ref 150–400)
RBC: 2.43 MIL/uL — ABNORMAL LOW (ref 4.22–5.81)
RDW: 18.4 % — ABNORMAL HIGH (ref 11.5–15.5)
WBC: 4.4 10*3/uL (ref 4.0–10.5)
nRBC: 0 % (ref 0.0–0.2)

## 2024-09-24 LAB — GLUCOSE, CAPILLARY
Glucose-Capillary: 144 mg/dL — ABNORMAL HIGH (ref 70–99)
Glucose-Capillary: 151 mg/dL — ABNORMAL HIGH (ref 70–99)
Glucose-Capillary: 177 mg/dL — ABNORMAL HIGH (ref 70–99)
Glucose-Capillary: 193 mg/dL — ABNORMAL HIGH (ref 70–99)
Glucose-Capillary: 210 mg/dL — ABNORMAL HIGH (ref 70–99)
Glucose-Capillary: 234 mg/dL — ABNORMAL HIGH (ref 70–99)

## 2024-09-24 LAB — MAGNESIUM: Magnesium: 1.5 mg/dL — ABNORMAL LOW (ref 1.7–2.4)

## 2024-09-24 LAB — PHOSPHORUS: Phosphorus: 1.7 mg/dL — ABNORMAL LOW (ref 2.5–4.6)

## 2024-09-24 LAB — BASIC METABOLIC PANEL WITH GFR
Anion gap: 10 (ref 5–15)
BUN: 17 mg/dL (ref 8–23)
CO2: 26 mmol/L (ref 22–32)
Calcium: 7.7 mg/dL — ABNORMAL LOW (ref 8.9–10.3)
Chloride: 95 mmol/L — ABNORMAL LOW (ref 98–111)
Creatinine, Ser: 0.65 mg/dL (ref 0.61–1.24)
GFR, Estimated: >60 mL/min
Glucose, Bld: 192 mg/dL — ABNORMAL HIGH (ref 70–99)
Potassium: 3.4 mmol/L — ABNORMAL LOW (ref 3.5–5.1)
Sodium: 131 mmol/L — ABNORMAL LOW (ref 135–145)

## 2024-09-24 LAB — VITAMIN B12: Vitamin B-12: 909 pg/mL (ref 180–914)

## 2024-09-24 LAB — C DIFFICILE QUICK SCREEN W PCR REFLEX
C Diff antigen: NEGATIVE
C Diff interpretation: NOT DETECTED
C Diff toxin: NEGATIVE

## 2024-09-24 LAB — CK: Total CK: 389 U/L (ref 49–397)

## 2024-09-24 MED ORDER — ORAL CARE MOUTH RINSE: 15.0000 mL | OROMUCOSAL | Status: DC | PRN

## 2024-09-24 MED ORDER — MUPIROCIN 2 % EX OINT
1.0000 | TOPICAL_OINTMENT | Freq: Two times a day (BID) | CUTANEOUS | Status: AC
  Administered 2024-09-24 – 2024-09-29 (×9): 1 via NASAL
  Filled 2024-09-24 (×3): qty 22

## 2024-09-24 MED ORDER — HYDROCERIN EX CREA
TOPICAL_CREAM | Freq: Two times a day (BID) | CUTANEOUS | Status: DC
  Administered 2024-09-26: 1 via TOPICAL
  Filled 2024-09-24 (×2): qty 113

## 2024-09-24 MED ORDER — POTASSIUM PHOSPHATES 15 MMOLE/5ML IV SOLN
30.0000 mmol | Freq: Once | INTRAVENOUS | Status: AC
  Administered 2024-09-24: 30 mmol via INTRAVENOUS
  Filled 2024-09-24: qty 10

## 2024-09-24 MED ORDER — ORAL CARE MOUTH RINSE
15.0000 mL | OROMUCOSAL | Status: DC
  Administered 2024-09-24 – 2024-10-03 (×35): 15 mL via OROMUCOSAL

## 2024-09-24 MED ORDER — MAGNESIUM SULFATE 4 GM/100ML IV SOLN
4.0000 g | Freq: Once | INTRAVENOUS | Status: AC
  Administered 2024-09-24: 4 g via INTRAVENOUS
  Filled 2024-09-24: qty 100

## 2024-09-24 NOTE — TOC Initial Note (Signed)
 Transition of Care [facility]) - Initial/Assessment Note    Patient Details  Name: [name] MRN: [id number] Date of Birth: 11/07/1950  [facility]) CM/SW Contact:    Corrie JINNY Ruts, LCSW Phone Number: 09/24/2024, 10:11 AM  Clinical Narrative:                 Chart reviewed. Please note, per chart review the patient is active with [facility] and was in [facility] in the past (20 days). SW was able to speak with the patient via telephone. I introduced myself, my role, and reason for call. The patient PCP is Buel Jewels. The patient reports that he lives in the home with his 2 sisters.   The patient reports that he is able to cook and shop for himself. The patient reports needing some help around the home. The patient reports that is sister drives himself to medical appointments. The patient reports that his sister will assist him during D/C. The patient reports that he uses [employer]. The patient reports that he has had HH and been in 3 SNF in the past. The patient reports that he has been in [place], [facility], and [facility]. The patient repots that he has a cane, wheel chair, and walker.   SW inquired about patient SDOH or housing risk. The patient declined and reports that he only needs someone to help him bath himself. SW encouraged the patient to speak with his sister about private duty nursing. Patient verbalized understanding.   There are not ICM needs.         Patient Goals and CMS Choice            Expected Discharge Plan and Services                                              Prior Living Arrangements/Services                       Activities of Daily Living      Permission Sought/Granted                  Emotional Assessment              Admission diagnosis:  Lactic acidosis [E87.20] Hypothermia, initial encounter [T68.XXXA] Septic shock (HCC) [A41.9, R65.21] Hypotension, unspecified hypotension  type [I95.9] Patient Active Problem List   Diagnosis Date Noted   Drug-induced neutropenia 08/12/2024   Neutropenic fever 08/07/2024   Pressure injury of buttock, stage 2 (HCC) 07/26/2024   Leukopenia 07/25/2024   Hypoglycemia 07/24/2024   Decubitus ulcer of left heel, unstageable (HCC) 07/24/2024   Hypotension 07/24/2024   Anemia of chronic disease 07/24/2024   Obesity, Class III, BMI 40-49.9 (morbid obesity) (HCC) 07/24/2024   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) 04/20/2024   Low serum vitamin B12 04/19/2024   Hip fracture (HCC) 04/17/2024   Pathological fracture 04/17/2024   Secondary hypercoagulable state 04/17/2024   Hematoma 04/17/2024   Acute blood loss anemia 04/17/2024   Open wound of right foot 04/17/2024   CKD stage 3a, GFR 45-59 ml/min (HCC) 04/17/2024   Encounter for screening colonoscopy 08/20/2023   Polyp of transverse colon 08/20/2023   Polyp of ascending colon 08/20/2023   Diabetic infection of right foot (HCC) 02/19/2023   Atherosclerosis of native arteries of  the extremities with ulceration (HCC) 07/14/2022   Diabetic foot infection (HCC) 01/03/2022   Hyperkalemia 01/03/2022   PAD (peripheral artery disease)    HTN (hypertension)    Type II diabetes mellitus with renal manifestations (HCC)    Acute renal failure superimposed on stage 3a chronic kidney disease (HCC)    Iron  deficiency anemia    Diabetes 1.5, 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   Lymphedema 01/11/2018   Chronic venous insufficiency 01/11/2018   Venous ulcer (HCC) 09/28/2017   Ulcer of foot with necrosis of bone (HCC) 10/30/2016   DM (diabetes mellitus), type 2 (HCC) 10/30/2016   Hyperlipidemia, unspecified 10/30/2016   Chronic kidney disease (CKD) stage G1/A3, glomerular filtration rate (GFR) equal to or greater than 90 mL/min/1.73 square meter and albuminuria creatinine ratio greater than 300 mg/g 10/27/2016   Pressure injury 10/04/2016   Septic shock (HCC) 10/03/2016    Erectile dysfunction 07/20/2014   Obesity 12/18/2010   Hypertension 02/19/2007   PCP:  Adina Buel HERO, MD Pharmacy:   [facility]. [place], [facility] - [address] [place] [address] Phone: [phone] Fax: [phone]   - [facility] [address] [place] [address] Phone: [phone] Fax: [phone]  [facility] - [place], [place] - [address] [facility] [address] [place] [address] Phone: [phone] Fax: [phone]     Social Drivers of Health (SDOH) Social History: SDOH Screenings   Food Insecurity: No Food Insecurity (09/22/2024)  Housing: High Risk (09/22/2024)  Transportation Needs: No Transportation Needs (09/22/2024)  Utilities: Not At Risk (09/22/2024)  Depression (PHQ2-9): Low Risk (09/13/2024)  Financial Resource Strain: Low Risk  (08/28/2023)   Received from [facility]  Recent Concern: Financial Resource Strain - Medium Risk (08/06/2023)   Received from [facility]  Physical Activity: Inactive (08/28/2023)   Received from [facility]  Social Connections: Unknown (09/22/2024)  Stress: No Stress Concern Present (08/28/2023)   Received from [facility]  Tobacco Use: Medium Risk (09/21/2024)  Health Literacy: Adequate Health Literacy (08/28/2023)   Received from [facility]   SDOH Interventions:     Readmission Risk Interventions    09/24/2024   10:11 AM  Readmission Risk Prevention Plan  Transportation Screening Complete  Medication Review (RN Care Manager) Complete  PCP or Specialist appointment within 3-5 days of discharge Complete  HRI or Home Care Consult Complete  SW Recovery Care/Counseling Consult Complete  Palliative Care Screening Not Applicable  Skilled Nursing Facility Complete

## 2024-09-24 NOTE — Plan of Care (Signed)

## 2024-09-24 NOTE — Progress Notes (Signed)
 "  NAME:  [name], MRN:  [id number], DOB:  [birth year], LOS: 3 ADMISSION DATE:  09/21/2024, CHIEF COMPLAINT:  Hypotension   History of Present Illness:   74 year old male with a past medical history significant for hypotension on midodrine , former HTN, HLD, CKD Stage III and metastatic prostate cancer, admitted for septic shock in the setting of urosepsis with rhabdomyolysis vs pneumonia.  [name] is a 74 year old male with a past medical history significant for hypotension on midodrine , former HTN, HLD, CKD Stage III, bladder incontinence and metastatic prostate cancer who presented to [facility] ED on 1/21 from an outpatient podiatry appointment for evaluation of hypotension.   History gathered per chart review and bedside evaluation It is reported he is wheelchair bound at baseline and recently was discharged from a rehab facility following hospitalization. [name] reported a recent fall on Monday 1/19 when getting up from his recliner. He denied loss of consciousness or hitting his head. He reports he has been feeling weak in general for about a week, but more noticeable after the fall. He denied symptoms of chest pain, cough, nausea/vomiting, dysuria, or flank pain. He did endorse mild shortness of breath but this is chronic. Additionally, he reported some intermittent diarrhea but has been going on for a few months on and off. His sister was with him on arrival and reported trying to get him to be evaluated at the ED the day of his fall but he declined. After arrival to his outpatient podiatry appointment for his chronic diabetic foot ulcers, he was found to be very hypotensive and was sent to the ED.    ED course: Upon arrival to [facility] ED, he was significantly hypotensive and was started on IV fluids for resuscitation. He was minimally responsive to fluids at that time and was started on peripheral levophed . Code sepsis was activated and he was covered with cefepime  and  vancomycin .   Pertinent  Medical History  Chronic hypotension on Midodrine  HTN - historical Hyperlipidemia CKD Stage 3 Metastatic Prostate Cancer Type II Diabetes Mellitus PVD Previous DVT 2018 Bladder Incontinence Arthritis  Significant Hospital Events: Including procedures, antibiotic start and stop dates in addition to other pertinent events   1/21: Admitted for circulatory/septic shock requiring levophed  gtt. Placed on bair hugger for hypothermia and given cefepime  + vancomycin . PCCM consulted for hypotension requiring vasopressors. Severe metabolic acidosis. Given 2 amps. 09/22/24- remains critically ill on levophed  15/vasopressin  0.04 not on MV. 09/23/24 - MRI foot without osteo, weaning down vasopressors 09/24/24 - pressors further weaned  Interim History / Subjective:  Awake and alert, denies pain, pressors down. Off nor-epi, remains on vaso  Objective    Blood pressure (!) 104/56, pulse 69, temperature 98.1 F (36.7 C), resp. rate 16, height 5' 11 (1.803 m), weight 109 kg, SpO2 99%.        Intake/Output Summary (Last 24 hours) at 09/24/2024 1800 Last data filed at 09/24/2024 1600 Gross per 24 hour  Intake 3550.42 ml  Output 3580 ml  Net -29.58 ml   Filed Weights   09/22/24 0500 09/23/24 0500 09/24/24 0411  Weight: 110.3 kg 103.7 kg 109 kg    Examination: Physical Exam Constitutional:      Appearance: He is ill-appearing.  Cardiovascular:     Rate and Rhythm: Normal rate and regular rhythm.     Pulses: Normal pulses.     Heart sounds: Normal heart sounds.  Pulmonary:     Effort: Pulmonary effort is normal.  Breath sounds: Normal breath sounds.  Neurological:     General: No focal deficit present.     Mental Status: He is alert. Mental status is at baseline.      Assessment and Plan   #Toxic Metabolic Encephalopathy #Circulatory Shock #UTI #Rhabdomyolysis #AKI on CKD #T2DM #Metastatic Prostate Cancer  Neurology - no active issue at this  point, patient without reported pain.  Cardiovascular - septic shock likely secondary to infected ulcer. On vasopressor support with nor-epinephrine , with vasopressin  discontinued. Titrating to goal MAP > 65 mmHg. TTE with normal LV/RV function.  -titrate nor-epi to MAP > 65 mmhg  Pulmonary - on room air, no active issues  Gastrointestinal - liberalized diet, tolerating well  Renal - kidney function at baseline, without electrolyte abnormalities.  Endocrine - ICU glycemic protocol  Hem/Onc - heparin  subQ for DVT prophylaxis  ID - continued on antibiotics for concern for cellulitis, with linezolid  added. No sign of osteomyelitis on his foot MRI. Will consult vascular on Monday to evaluate his lower extremity vasculature.    Labs   CBC: Recent Labs  Lab 09/21/24 1629 09/22/24 0002 09/22/24 0144 09/23/24 0420 09/24/24 0359  WBC 9.6 10.3 10.3 5.8 4.4  NEUTROABS 8.9*  --   --   --   --   HGB 9.7* 8.8* 8.9* 8.4* 7.3*  HCT 30.6* 28.9* 26.8* 24.5* 21.8*  MCV 93.9 100.0 91.2 88.4 89.7  PLT 319 320 302 255 203    Basic Metabolic Panel: Recent Labs  Lab 09/22/24 0144 09/22/24 0608 09/22/24 2059 09/23/24 0420 09/24/24 0359  NA 140 140 138 139 131*  K 4.8 4.8 4.4 4.1 3.4*  CL 106 109 104 105 95*  CO2 13* 16* 21* 22 26  GLUCOSE 133* 176* 160* 151* 192*  BUN 60* 54* 37* 28* 17  CREATININE 2.09* 1.75* 1.15 0.93 0.65  CALCIUM  7.9* 7.3* 7.5* 8.1* 7.7*  MG 1.4*  --   --  1.7 1.5*  PHOS 4.6  --   --  2.1* 1.7*   GFR: Estimated Creatinine Clearance: 101.8 mL/min (by C-G formula based on SCr of 0.65 mg/dL). Recent Labs  Lab 09/22/24 0002 09/22/24 0144 09/22/24 0608 09/22/24 1505 09/22/24 1806 09/23/24 0420 09/24/24 0359  PROCALCITON 0.30  --   --   --   --   --   --   WBC 10.3 10.3  --   --   --  5.8 4.4  LATICACIDVEN >9.0* 5.3* 2.9* 1.1 1.0  --   --     Liver Function Tests: Recent Labs  Lab 09/21/24 1629 09/22/24 0144  AST 91* 95*  ALT 22 20  ALKPHOS 136* 120   BILITOT 0.2 0.2  PROT 5.9* 5.3*  ALBUMIN  3.3* 2.8*   No results for input(s): LIPASE, AMYLASE in the last 168 hours. No results for input(s): AMMONIA in the last 168 hours.  ABG    Component Value Date/Time   HCO3 21.2 09/22/2024 2329   ACIDBASEDEF 3.1 (H) 09/22/2024 2329   O2SAT 63.1 09/22/2024 2329     Coagulation Profile: Recent Labs  Lab 09/21/24 1629  INR 1.3*    Cardiac Enzymes: Recent Labs  Lab 09/22/24 0002 09/22/24 0608 09/23/24 0420 09/24/24 0359  CKTOTAL 2,134* 1,442* 635* 389    HbA1C: Hgb A1c MFr Bld  Date/Time Value Ref Range Status  07/26/2024 07:22 AM 5.6 4.8 - 5.6 % Final    Comment:    (NOTE)         Prediabetes: 5.7 -  6.4         Diabetes: >6.4         Glycemic control for adults with diabetes: <7.0   04/17/2024 02:36 AM 6.7 (H) 4.8 - 5.6 % Final    Comment:    (NOTE)         Prediabetes: 5.7 - 6.4         Diabetes: >6.4         Glycemic control for adults with diabetes: <7.0     CBG: Recent Labs  Lab 09/24/24 0022 09/24/24 0317 09/24/24 0756 09/24/24 1142 09/24/24 1655  GLUCAP 234* 193* 144* 210* 151*    Review of Systems:   N/A  Past Medical History:  He,  has a past medical history of Arthritis, Bladder incontinence, Chronic kidney disease, Diabetes mellitus without complication (HCC), DVT of leg (deep venous thrombosis) (HCC), Dyspnea, Hyperlipidemia, Hypertension, Peripheral vascular disease, and Sepsis (HCC).   Surgical History:   Past Surgical History:  Procedure Laterality Date   ABDOMINAL AORTOGRAM W/LOWER EXTREMITY N/A 10/10/2016   Procedure: Abdominal Aortogram w/Lower Extremity;  Surgeon: Cordella KANDICE Shawl, MD;  Location: [facility];  Service: Cardiovascular;  Laterality: N/A;   CATARACT EXTRACTION W/PHACO Left 05/27/2018   Procedure: CATARACT EXTRACTION PHACO AND INTRAOCULAR LENS PLACEMENT (IOC);  Surgeon: Myrna Adine Anes, MD;  Location: [facility] ORS;  Service: Ophthalmology;  Laterality: Left;   US  03:06.2 AP% 16.1 CDE 30.16 FLUID PACK LOT @ [id number] H   CATARACT EXTRACTION W/PHACO Right 07/13/2023   Procedure: CATARACT EXTRACTION PHACO AND INTRAOCULAR LENS PLACEMENT (IOC) RIGHT DIABETIC 4.00 00:36.6;  Surgeon: Myrna Adine Anes, MD;  Location: [facility];  Service: Ophthalmology;  Laterality: Right;   CIRCUMCISION  1998   COLONOSCOPY WITH PROPOFOL  N/A 08/20/2023   Procedure: COLONOSCOPY WITH PROPOFOL ;  Surgeon: Unk Corinn Skiff, MD;  Location: [facility];  Service: Gastroenterology;  Laterality: N/A;   EYE SURGERY Left    Retina Detachment   GRAFT APPLICATION Right 05/29/2017   Procedure: GRAFT APPLICATION ( RIGHT FOOT );  Surgeon: Shawl Cordella KANDICE, MD;  Location: [facility] ORS;  Service: Vascular;  Laterality: Right;  graft taken from patients right thigh   IRRIGATION AND DEBRIDEMENT FOOT Right 10/03/2016   Procedure: IRRIGATION AND DEBRIDEMENT FOOT;  Surgeon: Eva Gay, DPM;  Location: [facility] ORS;  Service: Podiatry;  Laterality: Right;   LOWER EXTREMITY ANGIOGRAPHY Right 02/24/2023   Procedure: Lower Extremity Angiography;  Surgeon: Shawl Cordella KANDICE, MD;  Location: [facility];  Service: Cardiovascular;  Laterality: Right;   LOWER EXTREMITY INTERVENTION  10/10/2016   Procedure: Lower Extremity Intervention;  Surgeon: Cordella KANDICE Shawl, MD;  Location: [facility];  Service: Cardiovascular;;   PERIPHERAL VASCULAR BALLOON ANGIOPLASTY Left 10/10/2016   Procedure: Peripheral Vascular Balloon Angioplasty;  Surgeon: Cordella KANDICE Shawl, MD;  Location: [facility];  Service: Cardiovascular;  Laterality: Left;   TONSILLECTOMY     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/2022   Procedure: TRANSMETATARSAL AMPUTATION;  Surgeon: Gay Eva, DPM;  Location: [facility];  Service: Podiatry;  Laterality: Right;   WOUND DEBRIDEMENT Right 11/07/2016   Procedure: DEBRIDEMENT OF WOUND AND BONE RIGHT FOOT AND APPLY WOUND VAC;  Surgeon: Eva Gay, DPM;  Location: [facility]  ORS;  Service: Podiatry;  Laterality: Right;     Social History:   reports that he quit smoking about 49 years ago. His smoking use included pipe and cigarettes. He has never used smokeless tobacco. He reports that he does not drink alcohol and  does not use drugs.   Family History:  His family history includes Cancer in his father; Diabetes in his father and mother; Hypertension in his father and mother.   Allergies Allergies[1]   Home Medications  Prior to Admission medications  Medication Sig Start Date End Date Taking? Authorizing Provider  acetaminophen  (TYLENOL ) 325 MG tablet Take 2 tablets (650 mg total) by mouth every 6 (six) hours as needed for mild pain (pain score 1-3) or fever (or Fever >/= 101). 08/12/24  Yes Alexander, Natalie, DO  ascorbic Acid  (VITAMIN C ) 500 MG CPCR Take 500 mg by mouth daily.   Yes [provider]  aspirin  EC 81 MG tablet Take 81 mg by mouth daily. Swallow whole.   Yes [provider]  atorvastatin  (LIPITOR) 10 MG tablet Take 10 mg by mouth every evening. 06/12/20  Yes [provider]  collagenase  (SANTYL ) 250 UNIT/GM ointment Apply topically daily. To wound / pressure ulcers 08/13/24  Yes Alexander, Natalie, DO  cyanocobalamin  1000 MCG tablet Take 1 tablet (1,000 mcg total) by mouth daily. 08/13/24  Yes Alexander, Natalie, DO  enzalutamide  (XTANDI ) 40 MG tablet Take 4 tablets (160 mg total) by mouth daily. 09/13/24  Yes [name], MD  loperamide  (IMODIUM ) 2 MG capsule Take 2 capsules (4 mg total) by mouth every 8 (eight) hours as needed for diarrhea or loose stools. 08/12/24  Yes Alexander, Natalie, DO  LORazepam  (ATIVAN ) 1 MG tablet Take 1 tablet (1 mg total) by mouth every 8 (eight) hours. 08/12/24  Yes Alexander, Natalie, DO  metFORMIN  (GLUCOPHAGE ) 1000 MG tablet Take 1,000 mg by mouth 2 (two) times daily with a meal. 06/12/20  Yes [provider]  midodrine  (PROAMATINE ) 5 MG tablet Take 1 tablet (5 mg  total) by mouth 3 (three) times daily with meals. 08/12/24  Yes Alexander, Natalie, DO  ondansetron  (ZOFRAN ) 4 MG tablet Take 1 tablet (4 mg total) by mouth every 6 (six) hours as needed for nausea. 08/12/24  Yes Alexander, Natalie, DO  predniSONE  (DELTASONE ) 20 MG tablet Take 3 tablets (60 mg total) by mouth daily with breakfast. Further refill / taper instructions per oncology Patient taking differently: Take 40 mg by mouth daily with breakfast. Further refill / taper instructions per oncology 08/13/24  Yes Marsa Edelman, DO  TRADJENTA  5 MG TABS tablet Take 5 mg by mouth daily. 09/09/24  Yes [provider]  Zinc  220 (50 Zn) MG CAPS Take 220 mg by mouth daily.   Yes [provider]     The patient is critically ill due to septic shock.  Critical care was necessary to treat or prevent imminent or life-threatening deterioration. I personally performed titration, monitoring, and management of vasopressor/ionotrope infusion. Critical care time was spent by me on the following activities: development of a treatment plan with the patient and/or surrogate as well as nursing, discussions with consultants, evaluation of the patient's response to treatment, examination of the patient, obtaining a history from the patient or surrogate, ordering and performing treatments and interventions, ordering and review of laboratory studies, ordering and review of radiographic studies, review of telemetry data including pulse oximetry, re-evaluation of patient's condition and participation in multidisciplinary rounds.   I personally spent 31 minutes providing critical care not including any separately billable procedures.   Belva November, MD [facility] 09/24/2024 6:02 PM          [1]  Allergies Allergen Reactions   Sulfa Antibiotics Rash   Piperacillin -Tazobactam In Dex    "

## 2024-09-24 NOTE — Consult Note (Signed)
 " ORTHOPAEDIC CONSULTATION  REQUESTING PHYSICIAN: Isadora Hose, MD  Chief Complaint: Heel ulcers.    HPI: [name] is a 74 y.o. male who complains of bilateral heel ulcerations.  Recently admitted with hypertension concern for sepsis with he will possibly be in source.  MRI has been performed.  Patient has wound care orders placed.  In the ICU at this time.  Patient is stable.  Past Medical History:  Diagnosis Date   Arthritis    Bladder incontinence    Chronic kidney disease    Stage 3 Kidney disease   Diabetes mellitus without complication (HCC)    DVT of leg (deep venous thrombosis) (HCC)    H/O RIGHT LEG 2018   Dyspnea    occassional   Hyperlipidemia    Hypertension    Peripheral vascular disease    Sepsis (HCC)    Past Surgical History:  Procedure Laterality Date   ABDOMINAL AORTOGRAM W/LOWER EXTREMITY N/A 10/10/2016   Procedure: Abdominal Aortogram w/Lower Extremity;  Surgeon: Cordella KANDICE Shawl, MD;  Location: [facility];  Service: Cardiovascular;  Laterality: N/A;   CATARACT EXTRACTION W/PHACO Left 05/27/2018   Procedure: CATARACT EXTRACTION PHACO AND INTRAOCULAR LENS PLACEMENT (IOC);  Surgeon: Myrna Adine Anes, MD;  Location: [facility];  Service: Ophthalmology;  Laterality: Left;  US  03:06.2 AP% 16.1 CDE 30.16 FLUID PACK LOT @ [id number] H   CATARACT EXTRACTION W/PHACO Right 07/13/2023   Procedure: CATARACT EXTRACTION PHACO AND INTRAOCULAR LENS PLACEMENT (IOC) RIGHT DIABETIC 4.00 00:36.6;  Surgeon: Myrna Adine Anes, MD;  Location: [facility];  Service: Ophthalmology;  Laterality: Right;   CIRCUMCISION  1998   COLONOSCOPY WITH PROPOFOL  N/A 08/20/2023   Procedure: COLONOSCOPY WITH PROPOFOL ;  Surgeon: Unk Corinn Skiff, MD;  Location: [facility];  Service: Gastroenterology;  Laterality: N/A;   EYE SURGERY Left    Retina Detachment   GRAFT APPLICATION Right 05/29/2017   Procedure: GRAFT APPLICATION ( RIGHT FOOT );  Surgeon: Shawl Cordella KANDICE, MD;  Location: [facility];  Service: Vascular;  Laterality: Right;  graft taken from patients right thigh   IRRIGATION AND DEBRIDEMENT FOOT Right 10/03/2016   Procedure: IRRIGATION AND DEBRIDEMENT FOOT;  Surgeon: Eva Gay, DPM;  Location: [facility];  Service: Podiatry;  Laterality: Right;   LOWER EXTREMITY ANGIOGRAPHY Right 02/24/2023   Procedure: Lower Extremity Angiography;  Surgeon: Shawl Cordella KANDICE, MD;  Location: [facility];  Service: Cardiovascular;  Laterality: Right;   LOWER EXTREMITY INTERVENTION  10/10/2016   Procedure: Lower Extremity Intervention;  Surgeon: Cordella KANDICE Shawl, MD;  Location: [facility];  Service: Cardiovascular;;   PERIPHERAL VASCULAR BALLOON ANGIOPLASTY Left 10/10/2016   Procedure: Peripheral Vascular Balloon Angioplasty;  Surgeon: Cordella KANDICE Shawl, MD;  Location: [facility];  Service: Cardiovascular;  Laterality: Left;   TONSILLECTOMY     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/2022   Procedure: TRANSMETATARSAL AMPUTATION;  Surgeon: Gay Eva, DPM;  Location: [facility];  Service: Podiatry;  Laterality: Right;   WOUND DEBRIDEMENT Right 11/07/2016   Procedure: DEBRIDEMENT OF WOUND AND BONE RIGHT FOOT AND APPLY WOUND VAC;  Surgeon: Eva Gay, DPM;  Location: [facility];  Service: Podiatry;  Laterality: Right;   Social History   Socioeconomic History   Marital status: Single    Spouse name: Not on file   Number of children: Not on file   Years of education: Not on file   Highest education level: Not on file  Occupational History   Not on file  Tobacco Use   Smoking status: Former    Types: Pipe, Cigarettes    Quit date: 05/23/1975    Years since quitting: 49.3   Smokeless tobacco: Never  Vaping Use   Vaping status: Never Used  Substance and Sexual Activity   Alcohol use: No   Drug use: No   Sexual activity: Not on file  Other Topics Concern   Not on file  Social History Narrative   Not on file   Social Drivers of  Health   Tobacco Use: Medium Risk (09/21/2024)   Patient History    Smoking Tobacco Use: Former    Smokeless Tobacco Use: Never    Passive Exposure: Not on file  Financial Resource Strain: Low Risk  (08/28/2023)   Received from [facility]   Overall Financial Resource Strain (CARDIA)    Difficulty of Paying Living Expenses: Not hard at all  Recent Concern: Financial Resource Strain - Medium Risk (08/06/2023)   Received from [facility]   Overall Financial Resource Strain (CARDIA)    Difficulty of Paying Living Expenses: Somewhat hard  Food Insecurity: No Food Insecurity (09/22/2024)   Epic    Worried About Running Out of Food in the Last Year: Never true    Ran Out of Food in the Last Year: Never true  Transportation Needs: No Transportation Needs (09/22/2024)   Epic    Lack of Transportation (Medical): No    Lack of Transportation (Non-Medical): No  Physical Activity: Inactive (08/28/2023)   Received from [facility]   Exercise Vital Sign    On average, how many days per week do you engage in moderate to strenuous exercise (like a brisk walk)?: 0 days    On average, how many minutes do you engage in exercise at this level?: 0 min  Stress: No Stress Concern Present (08/28/2023)   Received from [facility] - Occupational Stress Questionnaire    Feeling of Stress : Not at all  Social Connections: Unknown (09/22/2024)   Social Connection and Isolation Panel    Frequency of Communication with Friends and Family: Three times a week    Frequency of Social Gatherings with Friends and Family: Three times a week    Attends Religious Services: Not on file    Active Member of Clubs or Organizations: Yes    Attends Club or Organization Meetings: 1 to 4 times per year    Marital Status: Never married  Depression (PHQ2-9): Low Risk (09/13/2024)   Depression (PHQ2-9)    PHQ-2 Score:  0  Alcohol Screen: Not on file  Housing: High Risk (09/22/2024)   Epic    Unable to Pay for Housing in the Last Year: No    Number of Times Moved in the Last Year: 2    Homeless in the Last Year: No  Utilities: Not At Risk (09/22/2024)   Epic    Threatened with loss of utilities: No  Health Literacy: Adequate Health Literacy (08/28/2023)   Received from [facility]   B1300 Health Literacy    Frequency of need for help with medical instructions: Never   Family History  Problem Relation Age of Onset   Diabetes Mother    Hypertension Mother    Diabetes Father    Hypertension Father    Cancer Father    Allergies[1] Prior to Admission medications  Medication Sig Start Date End Date Taking? Authorizing Provider  acetaminophen  (  TYLENOL ) 325 MG tablet Take 2 tablets (650 mg total) by mouth every 6 (six) hours as needed for mild pain (pain score 1-3) or fever (or Fever >/= 101). 08/12/24  Yes Alexander, Natalie, DO  ascorbic Acid  (VITAMIN C ) 500 MG CPCR Take 500 mg by mouth daily.   Yes [provider]  aspirin  EC 81 MG tablet Take 81 mg by mouth daily. Swallow whole.   Yes [provider]  atorvastatin  (LIPITOR) 10 MG tablet Take 10 mg by mouth every evening. 06/12/20  Yes [provider]  collagenase  (SANTYL ) 250 UNIT/GM ointment Apply topically daily. To wound / pressure ulcers 08/13/24  Yes Alexander, Natalie, DO  cyanocobalamin  1000 MCG tablet Take 1 tablet (1,000 mcg total) by mouth daily. 08/13/24  Yes Alexander, Natalie, DO  enzalutamide  (XTANDI ) 40 MG tablet Take 4 tablets (160 mg total) by mouth daily. 09/13/24  Yes Brahmanday, Govinda R, MD  loperamide  (IMODIUM ) 2 MG capsule Take 2 capsules (4 mg total) by mouth every 8 (eight) hours as needed for diarrhea or loose stools. 08/12/24  Yes Alexander, Natalie, DO  LORazepam  (ATIVAN ) 1 MG tablet Take 1 tablet (1 mg total) by mouth every 8 (eight) hours. 08/12/24  Yes Alexander, Natalie, DO   metFORMIN  (GLUCOPHAGE ) 1000 MG tablet Take 1,000 mg by mouth 2 (two) times daily with a meal. 06/12/20  Yes [provider]  midodrine  (PROAMATINE ) 5 MG tablet Take 1 tablet (5 mg total) by mouth 3 (three) times daily with meals. 08/12/24  Yes Alexander, Natalie, DO  ondansetron  (ZOFRAN ) 4 MG tablet Take 1 tablet (4 mg total) by mouth every 6 (six) hours as needed for nausea. 08/12/24  Yes Alexander, Natalie, DO  predniSONE  (DELTASONE ) 20 MG tablet Take 3 tablets (60 mg total) by mouth daily with breakfast. Further refill / taper instructions per oncology Patient taking differently: Take 40 mg by mouth daily with breakfast. Further refill / taper instructions per oncology 08/13/24  Yes Marsa Edelman, DO  TRADJENTA  5 MG TABS tablet Take 5 mg by mouth daily. 09/09/24  Yes [provider]  Zinc  220 (50 Zn) MG CAPS Take 220 mg by mouth daily.   Yes [provider]   ECHOCARDIOGRAM COMPLETE Result Date: 09/23/2024    ECHOCARDIOGRAM REPORT   Patient Name:   [name] Date of Exam: 09/23/2024 Medical Rec #:  [id number]         Height:       71.0 in Accession #:    [id number]        Weight:       228.6 lb Date of Birth:  16-Jul-1951        BSA:          2.232 m Patient Age:    73 years          BP:           106/54 mmHg Patient Gender: M                 HR:           80 bpm. Exam Location:  [facility] Procedure: 2D Echo (Both Spectral and Color Flow Doppler were utilized during            procedure). Indications:     shock  History:         Patient has prior history of Echocardiogram examinations, most                  recent  10/07/2016. Chronic kidney disease and PAD; Risk                  Factors:Diabetes, Hypertension and Dyslipidemia.  Sonographer:     Tinnie Barefoot RDCS Referring Phys:  [id number] LONELL KANDICE MOOSE Diagnosing Phys: Redell Cave MD  Sonographer Comments: Technically difficult study due to poor echo windows and suboptimal subcostal window. Image acquisition  challenging due to respiratory motion. IMPRESSIONS  1. Left ventricular ejection fraction, by estimation, is 60 to 65%. The left ventricle has normal function. The left ventricle has no regional wall motion abnormalities. Left ventricular diastolic parameters were normal.  2. Right ventricular systolic function is normal. The right ventricular size is not well visualized.  3. The mitral valve is normal in structure. No evidence of mitral valve regurgitation.  4. The aortic valve was not well visualized. Aortic valve regurgitation is not visualized. FINDINGS  Left Ventricle: Left ventricular ejection fraction, by estimation, is 60 to 65%. The left ventricle has normal function. The left ventricle has no regional wall motion abnormalities. Definity  contrast agent was given IV to delineate the left ventricular  endocardial borders. The left ventricular internal cavity size was normal in size. There is no left ventricular hypertrophy. Left ventricular diastolic parameters were normal. Right Ventricle: The right ventricular size is not well visualized. No increase in right ventricular wall thickness. Right ventricular systolic function is normal. Left Atrium: Left atrial size was normal in size. Right Atrium: Right atrial size was normal in size. Pericardium: There is no evidence of pericardial effusion. Mitral Valve: The mitral valve is normal in structure. No evidence of mitral valve regurgitation. Tricuspid Valve: The tricuspid valve is normal in structure. Tricuspid valve regurgitation is mild. Aortic Valve: The aortic valve was not well visualized. Aortic valve regurgitation is not visualized. Pulmonic Valve: The pulmonic valve was not well visualized. Pulmonic valve regurgitation is not visualized. Aorta: The aortic root is normal in size and structure. Venous: The inferior vena cava was not well visualized. IAS/Shunts: No atrial level shunt detected by color flow Doppler.  LEFT VENTRICLE PLAX 2D LVIDd:          4.70 cm      Diastology LVIDs:         3.10 cm      LV e' medial:    7.94 cm/s LV PW:         1.00 cm      LV E/e' medial:  9.8 LV IVS:        1.00 cm      LV e' lateral:   10.10 cm/s LVOT diam:     2.10 cm      LV E/e' lateral: 7.7 LV SV:         73 LV SV Index:   33 LVOT Area:     3.46 cm LV IVRT:       95 msec  LV Volumes (MOD) LV vol d, MOD A2C: 114.0 ml LV vol d, MOD A4C: 79.8 ml LV vol s, MOD A2C: 25.5 ml LV vol s, MOD A4C: 31.9 ml LV SV MOD A2C:     88.5 ml LV SV MOD A4C:     79.8 ml LV SV MOD BP:      68.5 ml  PULMONARY VEINS Diastolic Velocity: 52.00 cm/s S/D Velocity:       1.70 Systolic Velocity:  86.20 cm/s LEFT ATRIUM             Index LA diam:  4.00 cm 1.79 cm/m LA Vol (A2C):   64.6 ml 28.94 ml/m LA Vol (A4C):   48.3 ml 21.64 ml/m LA Biplane Vol: 55.9 ml 25.05 ml/m  AORTIC VALVE LVOT Vmax:   97.50 cm/s LVOT Vmean:  65.500 cm/s LVOT VTI:    0.211 m  AORTA Ao Root diam: 3.70 cm MITRAL VALVE               TRICUSPID VALVE MV Area (PHT): 2.99 cm    TR Peak grad:   35.8 mmHg MV Decel Time: 254 msec    TR Vmax:        299.00 cm/s MR Peak grad: 89.9 mmHg MR Vmax:      474.00 cm/s  SHUNTS MV E velocity: 77.60 cm/s  Systemic VTI:  0.21 m MV A velocity: 74.90 cm/s  Systemic Diam: 2.10 cm MV E/A ratio:  1.04 Redell Cave MD Electronically signed by Redell Cave MD Signature Date/Time: 09/23/2024/5:36:31 PM    Final    MR FOOT LEFT WO CONTRAST Result Date: 09/23/2024 CLINICAL DATA:  Chronic diabetic foot ulcers. Concern for osteomyelitis. EXAM: MRI OF THE LEFT FOOT WITHOUT CONTRAST TECHNIQUE: Multiplanar, multisequence MR imaging of the left foot was performed. No intravenous contrast was administered. COMPARISON:  Left foot radiographs dated 09/22/2024. FINDINGS: Bones/Joint/Cartilage No marrow signal abnormality identified to suggest acute osteomyelitis. No acute fracture or dislocation. 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 Mild-to-moderate  osteoarthritis of the midfoot. No significant joint effusion. Ligaments Lateral ankle ligaments appear intact. Fibrosis within the deltoid ligament likely reflects sequela of prior sprain. Lisfranc ligament is intact. Muscles and Tendons Flexor, peroneal and extensor compartment tendons are intact. Mild tenosynovitis of the flexor hallucis longus at the level of the knot of Henry. Generalized atrophy of the musculature. Soft tissue Moderate-sized soft tissue wound/ulceration of the lateral heel. Suspected small ulceration of the lateral midfoot. No loculated fluid collection. IMPRESSION: 1. No evidence of acute osteomyelitis. 2. Moderate-sized soft tissue wound/ulceration of the lateral heel. Suspected small ulceration of the lateral midfoot. No loculated fluid collection. 3. Avascular necrosis of the navicular. 4. 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 Mild osteoarthritis of the hindfoot. 5. Mild tenosynovitis of the flexor hallucis longus tendon at the level of the knot of Henry. Electronically Signed   By: Harrietta Sherry M.D.   On: 09/23/2024 16:38    Positive ROS: All other systems have been reviewed and were otherwise negative with the exception of those mentioned in the HPI and as above.  12 point ROS was performed.  Physical Exam: General: Alert and oriented.  No apparent distress.  Vascular:  Left foot:Dorsalis Pedis:  diminished Posterior Tibial:  diminished  Right foot: Dorsalis Pedis:  diminished Posterior Tibial:  diminished  Neuro:absent protective sensation  Derm:     Left heel ulcer is largest.  Leslie superficial in nature.  No purulent drainage.  Minimal surrounding erythema.  The right foot ulceration and fifth metatarsal base ulcer on the left foot are both superficial and stable.  Ortho/MS: He status post right foot transmetatarsal amputation.  Bilateral lower extremity edema is noted.  Both x-rays and MRI are but none from an infection standpoint.  No signs of  erosive change.  No signs of osteomyelitis on MRI.  No drainable abscess.  Assessment: Left heel ulceration Recent hypotension Diabetes with neuropathy  Plan: At this point no surgical intervention needed at this time.  Patient has local wound care orders in place and I agree with the  pressure reducing methods that are being performed at this time.  No signs of acute infection to the area at this point.  This will be difficult wound to heal given its location on the heel on the left side.  Patient would likely benefit from vascular surgery evaluation though not emergent at this time.  Instruct patient should follow-up with me in the next 2 to 3 weeks after discharge.    Ashley Eva LABOR, DPM Cell [phone]   09/24/2024 11:57 AM      [1]  Allergies Allergen Reactions   Sulfa Antibiotics Rash   Piperacillin -Tazobactam In Dex    "

## 2024-09-24 NOTE — Consult Note (Signed)
 PHARMACY CONSULT NOTE - ELECTROLYTES  Pharmacy Consult for Electrolyte Monitoring and Replacement   Recent Labs: Height: 5' 11 (180.3 cm) Weight: 109 kg (240 lb 4.8 oz) IBW/kg (Calculated) : 75.3 Estimated Creatinine Clearance: 101.8 mL/min (by C-G formula based on SCr of 0.65 mg/dL). Potassium (mmol/L)  Date Value  09/24/2024 3.4 (L)  04/04/2013 4.1   Magnesium  (mg/dL)  Date Value  98/75/7973 1.5 (L)   Calcium  (mg/dL)  Date Value  98/75/7973 7.7 (L)   Calcium , Total (mg/dL)  Date Value  91/95/7985 8.8   Albumin  (g/dL)  Date Value  98/77/7973 2.8 (L)   Phosphorus (mg/dL)  Date Value  98/75/7973 1.7 (L)   Sodium (mmol/L)  Date Value  09/24/2024 131 (L)  04/04/2013 140   Assessment  [name] is a 74 y.o. male presenting with septic shock. PMH significant for chronic hypotention, CKD-III, metastatic prostate cancer. Pharmacy has been consulted to monitor and replace electrolytes.  Diet: Dysphagia 3 MIVF: 1/2 NS at 125 cc/hr Pertinent medications: N/A  Goal of Therapy: Electrolytes WNL  Plan:  --Recommend modifying maintenance fluids given hyponatremia --K 3.4, Phos 1.7 - give potassium phosphate  30 mmol IV x 1 --Mg 1.5, magnesium  sulfate 4 g IV x 1 --Follow-up electrolytes with AM labs tomorrow  Thank you for allowing pharmacy to be a part of this patient's care.  Marolyn KATHEE Mare 09/24/2024 7:52 AM

## 2024-09-25 DIAGNOSIS — G928 Other toxic encephalopathy: Secondary | ICD-10-CM | POA: Diagnosis not present

## 2024-09-25 DIAGNOSIS — R6521 Severe sepsis with septic shock: Secondary | ICD-10-CM | POA: Diagnosis not present

## 2024-09-25 DIAGNOSIS — A419 Sepsis, unspecified organism: Secondary | ICD-10-CM | POA: Diagnosis not present

## 2024-09-25 DIAGNOSIS — N390 Urinary tract infection, site not specified: Secondary | ICD-10-CM | POA: Diagnosis not present

## 2024-09-25 LAB — CBC
HCT: 25.5 % — ABNORMAL LOW (ref 39.0–52.0)
Hemoglobin: 8.6 g/dL — ABNORMAL LOW (ref 13.0–17.0)
MCH: 30.5 pg (ref 26.0–34.0)
MCHC: 33.7 g/dL (ref 30.0–36.0)
MCV: 90.4 fL (ref 80.0–100.0)
Platelets: 211 10*3/uL (ref 150–400)
RBC: 2.82 MIL/uL — ABNORMAL LOW (ref 4.22–5.81)
RDW: 18.1 % — ABNORMAL HIGH (ref 11.5–15.5)
WBC: 3 10*3/uL — ABNORMAL LOW (ref 4.0–10.5)
nRBC: 0 % (ref 0.0–0.2)

## 2024-09-25 LAB — CORTISOL-AM, BLOOD: Cortisol - AM: 19.9 ug/dL (ref 6.7–22.6)

## 2024-09-25 LAB — CK: Total CK: 217 U/L (ref 49–397)

## 2024-09-25 LAB — BLOOD GAS, VENOUS
Acid-base deficit: 17.6 mmol/L — ABNORMAL HIGH (ref 0.0–2.0)
Bicarbonate: 11.5 mmol/L — ABNORMAL LOW (ref 20.0–28.0)
O2 Saturation: 28.4 %
Patient temperature: 37
pCO2, Ven: 38 mmHg — ABNORMAL LOW (ref 44–60)
pH, Ven: 7.09 — CL (ref 7.25–7.43)

## 2024-09-25 LAB — GLUCOSE, CAPILLARY
Glucose-Capillary: 128 mg/dL — ABNORMAL HIGH (ref 70–99)
Glucose-Capillary: 187 mg/dL — ABNORMAL HIGH (ref 70–99)
Glucose-Capillary: 188 mg/dL — ABNORMAL HIGH (ref 70–99)
Glucose-Capillary: 197 mg/dL — ABNORMAL HIGH (ref 70–99)
Glucose-Capillary: 200 mg/dL — ABNORMAL HIGH (ref 70–99)
Glucose-Capillary: 85 mg/dL (ref 70–99)

## 2024-09-25 LAB — BASIC METABOLIC PANEL WITH GFR
Anion gap: 9 (ref 5–15)
BUN: 10 mg/dL (ref 8–23)
CO2: 26 mmol/L (ref 22–32)
Calcium: 8 mg/dL — ABNORMAL LOW (ref 8.9–10.3)
Chloride: 105 mmol/L (ref 98–111)
Creatinine, Ser: 0.62 mg/dL (ref 0.61–1.24)
GFR, Estimated: >60 mL/min
Glucose, Bld: 155 mg/dL — ABNORMAL HIGH (ref 70–99)
Potassium: 3.3 mmol/L — ABNORMAL LOW (ref 3.5–5.1)
Sodium: 140 mmol/L (ref 135–145)

## 2024-09-25 LAB — PHOSPHORUS: Phosphorus: 2 mg/dL — ABNORMAL LOW (ref 2.5–4.6)

## 2024-09-25 LAB — MAGNESIUM: Magnesium: 1.8 mg/dL (ref 1.7–2.4)

## 2024-09-25 MED ORDER — LINEZOLID 600 MG PO TABS: 600.0000 mg | ORAL_TABLET | Freq: Two times a day (BID) | ORAL | Status: DC

## 2024-09-25 MED ORDER — POTASSIUM CHLORIDE 20 MEQ PO PACK
40.0000 meq | PACK | Freq: Once | ORAL | Status: AC
  Administered 2024-09-25: 40 meq via ORAL
  Filled 2024-09-25: qty 2

## 2024-09-25 MED ORDER — POTASSIUM PHOSPHATES 15 MMOLE/5ML IV SOLN
30.0000 mmol | Freq: Once | INTRAVENOUS | Status: AC
  Administered 2024-09-25: 30 mmol via INTRAVENOUS
  Filled 2024-09-25: qty 10

## 2024-09-25 MED ORDER — LINEZOLID 600 MG PO TABS
600.0000 mg | ORAL_TABLET | Freq: Two times a day (BID) | ORAL | Status: DC
  Administered 2024-09-25 – 2024-09-28 (×7): 600 mg via ORAL
  Filled 2024-09-25 (×9): qty 1

## 2024-09-25 MED ORDER — MAGNESIUM SULFATE 2 GM/50ML IV SOLN
2.0000 g | Freq: Once | INTRAVENOUS | Status: AC
  Administered 2024-09-25: 2 g via INTRAVENOUS
  Filled 2024-09-25: qty 50

## 2024-09-25 NOTE — Consult Note (Signed)
 PHARMACY CONSULT NOTE - ELECTROLYTES  Pharmacy Consult for Electrolyte Monitoring and Replacement   Recent Labs: Height: 5' 11 (180.3 cm) Weight: 107.2 kg (236 lb 5.3 oz) IBW/kg (Calculated) : 75.3 Estimated Creatinine Clearance: 100.9 mL/min (by C-G formula based on SCr of 0.62 mg/dL). Potassium (mmol/L)  Date Value  09/25/2024 3.3 (L)  04/04/2013 4.1   Magnesium  (mg/dL)  Date Value  98/74/7973 1.8   Calcium  (mg/dL)  Date Value  98/74/7973 8.0 (L)   Calcium , Total (mg/dL)  Date Value  91/95/7985 8.8   Albumin  (g/dL)  Date Value  98/77/7973 2.8 (L)   Phosphorus (mg/dL)  Date Value  98/74/7973 2.0 (L)   Sodium (mmol/L)  Date Value  09/25/2024 140  04/04/2013 140   Assessment  [name] is a 74 y.o. male presenting with septic shock. PMH significant for chronic hypotention, CKD-III, metastatic prostate cancer. Pharmacy has been consulted to monitor and replace electrolytes.  Diet: Dysphagia 3 MIVF: N/A Pertinent medications: N/A  Goal of Therapy: Electrolytes WNL  Plan:  --K 3.3, Phos 2 - give potassium phosphate  30 mmol IV x 1 (contains ~45 mEq K+) & Kcl 40 mEq PO x 1 dose --Mg 1.8, magnesium  sulfate 2 g IV x 1 --Follow-up electrolytes with AM labs tomorrow  Thank you for allowing pharmacy to be a part of this patient's care.  [name] B [name] 09/25/2024 6:40 AM

## 2024-09-25 NOTE — Progress Notes (Signed)
 "  NAME:  [name], MRN:  [id number], DOB:  1950/10/08, LOS: 4 ADMISSION DATE:  09/21/2024, CHIEF COMPLAINT:  Hypotension   History of Present Illness:   74 year old male with a past medical history significant for hypotension on midodrine , former HTN, HLD, CKD Stage III and metastatic prostate cancer, admitted for septic shock in the setting of urosepsis with rhabdomyolysis vs pneumonia.  [name] is a 74 year old male with a past medical history significant for hypotension on midodrine , former HTN, HLD, CKD Stage III, bladder incontinence and metastatic prostate cancer who presented to [facility] ED on 1/21 from an outpatient podiatry appointment for evaluation of hypotension.   History gathered per chart review and bedside evaluation It is reported he is wheelchair bound at baseline and recently was discharged from a rehab facility following hospitalization. Mr. Likes reported a recent fall on Monday 1/19 when getting up from his recliner. He denied loss of consciousness or hitting his head. He reports he has been feeling weak in general for about a week, but more noticeable after the fall. He denied symptoms of chest pain, cough, nausea/vomiting, dysuria, or flank pain. He did endorse mild shortness of breath but this is chronic. Additionally, he reported some intermittent diarrhea but has been going on for a few months on and off. His sister was with him on arrival and reported trying to get him to be evaluated at the ED the day of his fall but he declined. After arrival to his outpatient podiatry appointment for his chronic diabetic foot ulcers, he was found to be very hypotensive and was sent to the ED.    ED course: Upon arrival to [facility] ED, he was significantly hypotensive and was started on IV fluids for resuscitation. He was minimally responsive to fluids at that time and was started on peripheral levophed . Code sepsis was activated and he was covered with cefepime  and  vancomycin .   Pertinent  Medical History  Chronic hypotension on Midodrine  HTN - historical Hyperlipidemia CKD Stage 3 Metastatic Prostate Cancer Type II Diabetes Mellitus PVD Previous DVT 2018 Bladder Incontinence Arthritis  Significant Hospital Events: Including procedures, antibiotic start and stop dates in addition to other pertinent events   1/21: Admitted for circulatory/septic shock requiring levophed  gtt. Placed on bair hugger for hypothermia and given cefepime  + vancomycin . PCCM consulted for hypotension requiring vasopressors. Severe metabolic acidosis. Given 2 amps. 09/22/24- remains critically ill on levophed  15/vasopressin  0.04 not on MV. 09/23/24 - MRI foot without osteo, weaning down vasopressors 09/24/24 - pressors further weaned 09/25/24 - off pressors this AM, denies pain  Interim History / Subjective:  Awake, alert and oriented. Off pressors. Denies pain.  Objective    Blood pressure (!) 109/58, pulse 79, temperature 99.1 F (37.3 C), temperature source Bladder, resp. rate 16, height 5' 11 (1.803 m), weight 107.2 kg, SpO2 98%.        Intake/Output Summary (Last 24 hours) at 09/25/2024 0805 Last data filed at 09/25/2024 9277 Gross per 24 hour  Intake 1785.68 ml  Output 5830 ml  Net -4044.32 ml   Filed Weights   09/23/24 0500 09/24/24 0411 09/25/24 0500  Weight: 103.7 kg 109 kg 107.2 kg    Examination: Physical Exam Constitutional:      Appearance: He is ill-appearing.  Cardiovascular:     Rate and Rhythm: Normal rate and regular rhythm.     Pulses: Normal pulses.     Heart sounds: Normal heart sounds.  Pulmonary:  Effort: Pulmonary effort is normal.     Breath sounds: Normal breath sounds.  Neurological:     General: No focal deficit present.     Mental Status: He is alert. Mental status is at baseline.      Assessment and Plan   #Toxic Metabolic Encephalopathy #Circulatory Shock #UTI #Rhabdomyolysis #AKI on CKD #T2DM #Metastatic  Prostate Cancer  Neurology - no active issue at this point, patient without reported pain.  Cardiovascular - septic shock likely secondary to infected ulcer. Received nor-epinephrine  and vasopressin  for BP control, now both discontinued. Titrating to goal MAP > 65 mmHg. TTE with normal LV/RV function.  -titrate nor-epi for MAP > 65 mmHg  Pulmonary - on room air, no active issues  Gastrointestinal - liberalized diet, tolerating well  Renal - kidney function at baseline, without electrolyte abnormalities.  Endocrine - ICU glycemic protocol. Will check AM cortisol to assess for adrenal insufficiency.  Hem/Onc - heparin  subQ for DVT prophylaxis  ID - continued on antibiotics for concern for cellulitis, with linezolid  added. No sign of osteomyelitis on his foot MRI. consult vascular on Monday to evaluate his lower extremity vasculature.   Labs   CBC: Recent Labs  Lab 09/21/24 1629 09/22/24 0002 09/22/24 0144 09/23/24 0420 09/24/24 0359  WBC 9.6 10.3 10.3 5.8 4.4  NEUTROABS 8.9*  --   --   --   --   HGB 9.7* 8.8* 8.9* 8.4* 7.3*  HCT 30.6* 28.9* 26.8* 24.5* 21.8*  MCV 93.9 100.0 91.2 88.4 89.7  PLT 319 320 302 255 203    Basic Metabolic Panel: Recent Labs  Lab 09/22/24 0144 09/22/24 0608 09/22/24 2059 09/23/24 0420 09/24/24 0359 09/25/24 0349  NA 140 140 138 139 131* 140  K 4.8 4.8 4.4 4.1 3.4* 3.3*  CL 106 109 104 105 95* 105  CO2 13* 16* 21* 22 26 26   GLUCOSE 133* 176* 160* 151* 192* 155*  BUN 60* 54* 37* 28* 17 10  CREATININE 2.09* 1.75* 1.15 0.93 0.65 0.62  CALCIUM  7.9* 7.3* 7.5* 8.1* 7.7* 8.0*  MG 1.4*  --   --  1.7 1.5* 1.8  PHOS 4.6  --   --  2.1* 1.7* 2.0*   GFR: Estimated Creatinine Clearance: 100.9 mL/min (by C-G formula based on SCr of 0.62 mg/dL). Recent Labs  Lab 09/22/24 0002 09/22/24 0144 09/22/24 0608 09/22/24 1505 09/22/24 1806 09/23/24 0420 09/24/24 0359  PROCALCITON 0.30  --   --   --   --   --   --   WBC 10.3 10.3  --   --   --  5.8  4.4  LATICACIDVEN >9.0* 5.3* 2.9* 1.1 1.0  --   --     Liver Function Tests: Recent Labs  Lab 09/21/24 1629 09/22/24 0144  AST 91* 95*  ALT 22 20  ALKPHOS 136* 120  BILITOT 0.2 0.2  PROT 5.9* 5.3*  ALBUMIN  3.3* 2.8*   No results for input(s): LIPASE, AMYLASE in the last 168 hours. No results for input(s): AMMONIA in the last 168 hours.  ABG    Component Value Date/Time   HCO3 21.2 09/22/2024 2329   ACIDBASEDEF 3.1 (H) 09/22/2024 2329   O2SAT 63.1 09/22/2024 2329     Coagulation Profile: Recent Labs  Lab 09/21/24 1629  INR 1.3*    Cardiac Enzymes: Recent Labs  Lab 09/22/24 0002 09/22/24 0608 09/23/24 0420 09/24/24 0359 09/25/24 0349  CKTOTAL 2,134* 1,442* 635* 389 217    HbA1C: Hgb A1c MFr Bld  Date/Time Value Ref Range Status  07/26/2024 07:22 AM 5.6 4.8 - 5.6 % Final    Comment:    (NOTE)         Prediabetes: 5.7 - 6.4         Diabetes: >6.4         Glycemic control for adults with diabetes: <7.0   04/17/2024 02:36 AM 6.7 (H) 4.8 - 5.6 % Final    Comment:    (NOTE)         Prediabetes: 5.7 - 6.4         Diabetes: >6.4         Glycemic control for adults with diabetes: <7.0     CBG: Recent Labs  Lab 09/24/24 1142 09/24/24 1655 09/24/24 1910 09/25/24 0008 09/25/24 0309  GLUCAP 210* 151* 177* 200* 188*    Review of Systems:   N/A  Past Medical History:  He,  has a past medical history of Arthritis, Bladder incontinence, Chronic kidney disease, Diabetes mellitus without complication (HCC), DVT of leg (deep venous thrombosis) (HCC), Dyspnea, Hyperlipidemia, Hypertension, Peripheral vascular disease, and Sepsis (HCC).   Surgical History:   Past Surgical History:  Procedure Laterality Date   ABDOMINAL AORTOGRAM W/LOWER EXTREMITY N/A 10/10/2016   Procedure: Abdominal Aortogram w/Lower Extremity;  Surgeon: Cordella KANDICE Shawl, MD;  Location: [facility];  Service: Cardiovascular;  Laterality: N/A;   CATARACT EXTRACTION W/PHACO  Left 05/27/2018   Procedure: CATARACT EXTRACTION PHACO AND INTRAOCULAR LENS PLACEMENT (IOC);  Surgeon: Myrna Adine Anes, MD;  Location: [facility] ORS;  Service: Ophthalmology;  Laterality: Left;  US  03:06.2 AP% 16.1 CDE 30.16 FLUID PACK LOT @ [id number] H   CATARACT EXTRACTION W/PHACO Right 07/13/2023   Procedure: CATARACT EXTRACTION PHACO AND INTRAOCULAR LENS PLACEMENT (IOC) RIGHT DIABETIC 4.00 00:36.6;  Surgeon: Myrna Adine Anes, MD;  Location: [facility];  Service: Ophthalmology;  Laterality: Right;   CIRCUMCISION  1998   COLONOSCOPY WITH PROPOFOL  N/A 08/20/2023   Procedure: COLONOSCOPY WITH PROPOFOL ;  Surgeon: Unk Corinn Skiff, MD;  Location: [facility] END[facility]COPY;  Service: Gastroenterology;  Laterality: N/A;   EYE SURGERY Left    Retina Detachment   GRAFT APPLICATION Right 05/29/2017   Procedure: GRAFT APPLICATION ( RIGHT FOOT );  Surgeon: Shawl Cordella KANDICE, MD;  Location: [facility] ORS;  Service: Vascular;  Laterality: Right;  graft taken from patients right thigh   IRRIGATION AND DEBRIDEMENT FOOT Right 10/03/2016   Procedure: IRRIGATION AND DEBRIDEMENT FOOT;  Surgeon: Eva Gay, DPM;  Location: [facility] ORS;  Service: Podiatry;  Laterality: Right;   LOWER EXTREMITY ANGIOGRAPHY Right 02/24/2023   Procedure: Lower Extremity Angiography;  Surgeon: Shawl Cordella KANDICE, MD;  Location: [facility];  Service: Cardiovascular;  Laterality: Right;   LOWER EXTREMITY INTERVENTION  10/10/2016   Procedure: Lower Extremity Intervention;  Surgeon: Cordella KANDICE Shawl, MD;  Location: [facility];  Service: Cardiovascular;;   PERIPHERAL VASCULAR BALLOON ANGIOPLASTY Left 10/10/2016   Procedure: Peripheral Vascular Balloon Angioplasty;  Surgeon: Cordella KANDICE Shawl, MD;  Location: [facility];  Service: Cardiovascular;  Laterality: Left;   TONSILLECTOMY     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/2022   Procedure: TRANSMETATARSAL AMPUTATION;  Surgeon: Gay Eva, DPM;  Location: [facility] OR[facility];   Service: Podiatry;  Laterality: Right;   WOUND DEBRIDEMENT Right 11/07/2016   Procedure: DEBRIDEMENT OF WOUND AND BONE RIGHT FOOT AND APPLY WOUND VAC;  Surgeon: Eva Gay, DPM;  Location: [facility] OR[facility];  Service: Podiatry;  Laterality: Right;     Social  History:   reports that he quit smoking about 49 years ago. His smoking use included pipe and cigarettes. He has never used smokeless tobacco. He reports that he does not drink alcohol and does not use drugs.   Family History:  His family history includes Cancer in his father; Diabetes in his father and mother; Hypertension in his father and mother.   Allergies Allergies[1]   Home Medications  Prior to Admission medications  Medication Sig Start Date End Date Taking? Authorizing Provider  acetaminophen  (TYLENOL ) 325 MG tablet Take 2 tablets (650 mg total) by mouth every 6 (six) hours as needed for mild pain (pain score 1-3) or fever (or Fever >/= 101). 08/12/24  Yes Alexander, Natalie, DO  ascorbic Acid  (VITAMIN C ) 500 MG CPCR Take 500 mg by mouth daily.   Yes [provider]  aspirin  EC 81 MG tablet Take 81 mg by mouth daily. Swallow whole.   Yes [provider]  atorvastatin  (LIPITOR) 10 MG tablet Take 10 mg by mouth every evening. 06/12/20  Yes [provider]  collagenase  (SANTYL ) 250 UNIT/GM ointment Apply topically daily. To wound / pressure ulcers 08/13/24  Yes Alexander, Natalie, DO  cyanocobalamin  1000 MCG tablet Take 1 tablet (1,000 mcg total) by mouth daily. 08/13/24  Yes Alexander, Natalie, DO  enzalutamide  (XTANDI ) 40 MG tablet Take 4 tablets (160 mg total) by mouth daily. 09/13/24  Yes Brahmanday, Govinda R, MD  loperamide  (IMODIUM ) 2 MG capsule Take 2 capsules (4 mg total) by mouth every 8 (eight) hours as needed for diarrhea or loose stools. 08/12/24  Yes Alexander, Natalie, DO  LORazepam  (ATIVAN ) 1 MG tablet Take 1 tablet (1 mg total) by mouth every 8 (eight) hours. 08/12/24  Yes Alexander, Natalie, DO   metFORMIN  (GLUCOPHAGE ) 1000 MG tablet Take 1,000 mg by mouth 2 (two) times daily with a meal. 06/12/20  Yes [provider]  midodrine  (PROAMATINE ) 5 MG tablet Take 1 tablet (5 mg total) by mouth 3 (three) times daily with meals. 08/12/24  Yes Alexander, Natalie, DO  ondansetron  (ZOFRAN ) 4 MG tablet Take 1 tablet (4 mg total) by mouth every 6 (six) hours as needed for nausea. 08/12/24  Yes Alexander, Natalie, DO  predniSONE  (DELTASONE ) 20 MG tablet Take 3 tablets (60 mg total) by mouth daily with breakfast. Further refill / taper instructions per oncology Patient taking differently: Take 40 mg by mouth daily with breakfast. Further refill / taper instructions per oncology 08/13/24  Yes Marsa Edelman, DO  TRADJENTA  5 MG TABS tablet Take 5 mg by mouth daily. 09/09/24  Yes [provider]  Zinc  220 (50 Zn) MG CAPS Take 220 mg by mouth daily.   Yes [provider]     The patient is critically ill due to septic shock, infected pressure ulcer.  Critical care was necessary to treat or prevent imminent or life-threatening deterioration. I personally performed titration, monitoring, and management of vasopressor/ionotrope infusion and mechanical ventilation management and titration. Critical care time was spent by me on the following activities: development of a treatment plan with the patient and/or surrogate as well as nursing, discussions with consultants, evaluation of the patient's response to treatment, examination of the patient, obtaining a history from the patient or surrogate, ordering and performing treatments and interventions, ordering and review of laboratory studies, ordering and review of radiographic studies, review of telemetry data including pulse oximetry, re-evaluation of patient's condition and participation in multidisciplinary rounds.   I personally spent 33 minutes providing critical care  not including any separately billable procedures.   Belva November,  MD [facility] 09/25/2024 12:33 PM            [1]  Allergies Allergen Reactions   Sulfa Antibiotics Rash   Piperacillin -Tazobactam In Dex    "

## 2024-09-25 NOTE — Plan of Care (Signed)

## 2024-09-25 NOTE — Progress Notes (Signed)
 Heart rate drop to 39 around 1630 pm today. ICU provider has been notified.

## 2024-09-26 DIAGNOSIS — I70235 Atherosclerosis of native arteries of right leg with ulceration of other part of foot: Secondary | ICD-10-CM | POA: Diagnosis not present

## 2024-09-26 DIAGNOSIS — E785 Hyperlipidemia, unspecified: Secondary | ICD-10-CM | POA: Diagnosis not present

## 2024-09-26 DIAGNOSIS — Z79899 Other long term (current) drug therapy: Secondary | ICD-10-CM

## 2024-09-26 DIAGNOSIS — Z794 Long term (current) use of insulin: Secondary | ICD-10-CM

## 2024-09-26 DIAGNOSIS — A419 Sepsis, unspecified organism: Secondary | ICD-10-CM | POA: Diagnosis not present

## 2024-09-26 DIAGNOSIS — N390 Urinary tract infection, site not specified: Secondary | ICD-10-CM | POA: Diagnosis not present

## 2024-09-26 DIAGNOSIS — I10 Essential (primary) hypertension: Secondary | ICD-10-CM

## 2024-09-26 DIAGNOSIS — R6521 Severe sepsis with septic shock: Secondary | ICD-10-CM | POA: Diagnosis not present

## 2024-09-26 DIAGNOSIS — L97519 Non-pressure chronic ulcer of other part of right foot with unspecified severity: Secondary | ICD-10-CM | POA: Diagnosis not present

## 2024-09-26 DIAGNOSIS — L97429 Non-pressure chronic ulcer of left heel and midfoot with unspecified severity: Secondary | ICD-10-CM

## 2024-09-26 DIAGNOSIS — E1151 Type 2 diabetes mellitus with diabetic peripheral angiopathy without gangrene: Secondary | ICD-10-CM | POA: Diagnosis not present

## 2024-09-26 DIAGNOSIS — G928 Other toxic encephalopathy: Secondary | ICD-10-CM | POA: Diagnosis not present

## 2024-09-26 LAB — MAGNESIUM: Magnesium: 1.7 mg/dL (ref 1.7–2.4)

## 2024-09-26 LAB — CULTURE, BLOOD (ROUTINE X 2)
Culture: NO GROWTH
Culture: NO GROWTH

## 2024-09-26 LAB — GLUCOSE, CAPILLARY
Glucose-Capillary: 109 mg/dL — ABNORMAL HIGH (ref 70–99)
Glucose-Capillary: 118 mg/dL — ABNORMAL HIGH (ref 70–99)
Glucose-Capillary: 124 mg/dL — ABNORMAL HIGH (ref 70–99)
Glucose-Capillary: 138 mg/dL — ABNORMAL HIGH (ref 70–99)
Glucose-Capillary: 145 mg/dL — ABNORMAL HIGH (ref 70–99)
Glucose-Capillary: 209 mg/dL — ABNORMAL HIGH (ref 70–99)
Glucose-Capillary: 86 mg/dL (ref 70–99)

## 2024-09-26 LAB — CBC WITH DIFFERENTIAL/PLATELET
Abs Immature Granulocytes: 0.02 10*3/uL (ref 0.00–0.07)
Basophils Absolute: 0 10*3/uL (ref 0.0–0.1)
Basophils Relative: 0 %
Eosinophils Absolute: 0 10*3/uL (ref 0.0–0.5)
Eosinophils Relative: 0 %
HCT: 26.1 % — ABNORMAL LOW (ref 39.0–52.0)
Hemoglobin: 8.5 g/dL — ABNORMAL LOW (ref 13.0–17.0)
Immature Granulocytes: 1 %
Lymphocytes Relative: 9 %
Lymphs Abs: 0.3 10*3/uL — ABNORMAL LOW (ref 0.7–4.0)
MCH: 29.8 pg (ref 26.0–34.0)
MCHC: 32.6 g/dL (ref 30.0–36.0)
MCV: 91.6 fL (ref 80.0–100.0)
Monocytes Absolute: 0.6 10*3/uL (ref 0.1–1.0)
Monocytes Relative: 16 %
Neutro Abs: 2.6 10*3/uL (ref 1.7–7.7)
Neutrophils Relative %: 74 %
Platelets: 244 10*3/uL (ref 150–400)
RBC: 2.85 MIL/uL — ABNORMAL LOW (ref 4.22–5.81)
RDW: 18 % — ABNORMAL HIGH (ref 11.5–15.5)
WBC: 3.5 10*3/uL — ABNORMAL LOW (ref 4.0–10.5)
nRBC: 0 % (ref 0.0–0.2)

## 2024-09-26 LAB — CK: Total CK: 137 U/L (ref 49–397)

## 2024-09-26 LAB — BASIC METABOLIC PANEL WITH GFR
Anion gap: 8 (ref 5–15)
BUN: 6 mg/dL — ABNORMAL LOW (ref 8–23)
CO2: 27 mmol/L (ref 22–32)
Calcium: 8.4 mg/dL — ABNORMAL LOW (ref 8.9–10.3)
Chloride: 105 mmol/L (ref 98–111)
Creatinine, Ser: 0.63 mg/dL (ref 0.61–1.24)
GFR, Estimated: >60 mL/min
Glucose, Bld: 99 mg/dL (ref 70–99)
Potassium: 3.9 mmol/L (ref 3.5–5.1)
Sodium: 140 mmol/L (ref 135–145)

## 2024-09-26 LAB — PHOSPHORUS: Phosphorus: 2 mg/dL — ABNORMAL LOW (ref 2.5–4.6)

## 2024-09-26 MED ORDER — MIDODRINE HCL 5 MG PO TABS
10.0000 mg | ORAL_TABLET | Freq: Three times a day (TID) | ORAL | Status: DC
  Administered 2024-09-26 – 2024-09-30 (×13): 10 mg via ORAL
  Filled 2024-09-26 (×14): qty 2

## 2024-09-26 MED ORDER — ENSURE PLUS HIGH PROTEIN PO LIQD
237.0000 mL | Freq: Two times a day (BID) | ORAL | Status: DC
  Administered 2024-09-27 – 2024-09-30 (×5): 237 mL via ORAL

## 2024-09-26 MED ORDER — ALBUMIN HUMAN 25 % IV SOLN
25.0000 g | Freq: Once | INTRAVENOUS | Status: AC
  Administered 2024-09-27: 25 g via INTRAVENOUS
  Filled 2024-09-26: qty 100

## 2024-09-26 MED ORDER — OXYCODONE HCL 5 MG PO TABS
5.0000 mg | ORAL_TABLET | Freq: Four times a day (QID) | ORAL | Status: DC | PRN
  Administered 2024-09-26: 5 mg via ORAL
  Filled 2024-09-26: qty 1

## 2024-09-26 MED ORDER — LACTATED RINGERS IV BOLUS
500.0000 mL | Freq: Once | INTRAVENOUS | Status: AC
  Administered 2024-09-27: 500 mL via INTRAVENOUS

## 2024-09-26 MED ORDER — K PHOS MONO-SOD PHOS DI & MONO 155-852-130 MG PO TABS
500.0000 mg | ORAL_TABLET | Freq: Once | ORAL | Status: AC
  Administered 2024-09-26: 500 mg
  Filled 2024-09-26: qty 2

## 2024-09-26 MED ORDER — MAGNESIUM SULFATE 2 GM/50ML IV SOLN
2.0000 g | Freq: Once | INTRAVENOUS | Status: AC
  Administered 2024-09-26: 2 g via INTRAVENOUS
  Filled 2024-09-26: qty 50

## 2024-09-26 NOTE — Plan of Care (Signed)
  Problem: Pain Managment: Goal: General experience of comfort will improve and/or be controlled Outcome: Progressing   Problem: Safety: Goal: Ability to remain free from injury will improve Outcome: Progressing

## 2024-09-26 NOTE — Consult Note (Signed)
 PHARMACY CONSULT NOTE - ELECTROLYTES  Pharmacy Consult for Electrolyte Monitoring and Replacement   Recent Labs: Height: 5' 11 (180.3 cm) Weight: 109.2 kg (240 lb 11.9 oz) IBW/kg (Calculated) : 75.3 Estimated Creatinine Clearance: 101.9 mL/min (by C-G formula based on SCr of 0.63 mg/dL). Potassium (mmol/L)  Date Value  09/26/2024 3.9  04/04/2013 4.1   Magnesium  (mg/dL)  Date Value  98/73/7973 1.7   Calcium  (mg/dL)  Date Value  98/73/7973 8.4 (L)   Calcium , Total (mg/dL)  Date Value  91/95/7985 8.8   Albumin  (g/dL)  Date Value  98/77/7973 2.8 (L)   Phosphorus (mg/dL)  Date Value  98/73/7973 2.0 (L)   Sodium (mmol/L)  Date Value  09/26/2024 140  04/04/2013 140   Assessment  [name] is a 74 y.o. male presenting with septic shock. PMH significant for chronic hypotention, CKD-III, metastatic prostate cancer. Pharmacy has been consulted to monitor and replace electrolytes.  Goal of Therapy: Electrolytes WNL  Plan:  --500 mg k-phos neutral x 1 (contains phosphorus 16 mMol, potassium 2.2 mEq) --magnesium  sulfate 2 grams IV x 1 --Follow-up electrolytes with AM labs tomorrow  Thank you for allowing pharmacy to be a part of this patient's care.  Adriana JONETTA Bolster 09/26/2024 7:19 AM

## 2024-09-26 NOTE — Consult Note (Signed)
 " [name] Vascular Consult Note  MRN : [id number]  [name] is a 74 y.o. (03/09/1951) male who presents with chief complaint of  Chief Complaint  Patient presents with   Hypoglycemia  .  History of Present Illness: I am asked to see the patient by Dr. Isadora for nonhealing ulcerations of the lower extremity.  He has had previous vascular evaluation but nothing for about 2 years.  He had a right leg angiogram about 2 years ago which showed good flow to the foot.  He had noninvasive studies about 4 years ago which showed noncompressible vessels due to medial calcification, but the digit pressures were normal at that time on both sides.  He has been seen by podiatry.  His wounds on both feet are relatively stable but the left heel ulcer is the larger and more worrisome of the two.  He is not having a lot of pain from this.  He denies any trauma or injury and these are largely just pressure sores.  He is very immobile.  Current Facility-Administered Medications  Medication Dose Route Frequency Provider Last Rate Last Admin   ascorbic acid  (VITAMIN C ) tablet 500 mg  500 mg Oral BID Isadora Hose, MD   500 mg at 09/26/24 9046   Chlorhexidine  Gluconate Cloth 2 % PADS 6 each  6 each Topical Daily Aleskerov, Fuad, MD   6 each at 09/25/24 2245   heparin  injection 5,000 Units  5,000 Units Subcutaneous Q8H Bousman, Karlie, PA-C   5,000 Units at 09/26/24 9349   hydrocerin (EUCERIN) cream   Topical BID Isadora Hose, MD   Given at 09/26/24 1127   insulin  aspart (novoLOG ) injection 0-9 Units  0-9 Units Subcutaneous Q4H Bousman, Karlie, PA-C   1 Units at 09/26/24 1228   linezolid  (ZYVOX ) tablet 600 mg  600 mg Oral Q12H Nazari, Walid A, RPH   600 mg at 09/26/24 9046   midodrine  (PROAMATINE ) tablet 10 mg  10 mg Oral TID WC Isadora Hose, MD   10 mg at 09/26/24 1229   multivitamin with minerals tablet 1 tablet  1 tablet Oral Daily Isadora Hose, MD   1 tablet at 09/26/24  9041   mupirocin  ointment (BACTROBAN ) 2 % 1 Application  1 Application Nasal BID Isadora Hose, MD   1 Application at 09/26/24 0954   norepinephrine  (LEVOPHED ) 16 mg in 250mL (0.064 mg/mL) premix infusion  0-40 mcg/min Intravenous Continuous Isadora Hose, MD 2.81 mL/hr at 09/26/24 1215 3 mcg/min at 09/26/24 1215   Oral care mouth rinse  15 mL Mouth Rinse PRN Parris Manna, MD       Oral care mouth rinse  15 mL Mouth Rinse 4 times per day Isadora Hose, MD   15 mL at 09/26/24 1225   Oral care mouth rinse  15 mL Mouth Rinse PRN Isadora Hose, MD       polyethylene glycol (MIRALAX  / GLYCOLAX ) packet 17 g  17 g Oral Daily PRN Bousman, Karlie, PA-C       senna (SENOKOT) tablet 8.6 mg  1 tablet Oral BID PRN Bousman, Karlie, PA-C       thiamine  (VITAMIN B1) tablet 100 mg  100 mg Oral Daily Dgayli, Hose, MD   100 mg at 09/26/24 9046   zinc  sulfate (50mg  elemental zinc ) capsule 220 mg  220 mg Oral Daily Isadora Hose, MD   220 mg at 09/26/24 9041    Past Medical History:  Diagnosis Date   Arthritis  Bladder incontinence    Chronic kidney disease    Stage 3 Kidney disease   Diabetes mellitus without complication (HCC)    DVT of leg (deep venous thrombosis) (HCC)    H/O RIGHT LEG 2018   Dyspnea    occassional   Hyperlipidemia    Hypertension    Peripheral vascular disease    Sepsis (HCC)     Past Surgical History:  Procedure Laterality Date   ABDOMINAL AORTOGRAM W/LOWER EXTREMITY N/A 10/10/2016   Procedure: Abdominal Aortogram w/Lower Extremity;  Surgeon: Cordella KANDICE Shawl, MD;  Location: [facility];  Service: Cardiovascular;  Laterality: N/A;   CATARACT EXTRACTION W/PHACO Left 05/27/2018   Procedure: CATARACT EXTRACTION PHACO AND INTRAOCULAR LENS PLACEMENT (IOC);  Surgeon: Myrna Adine Anes, MD;  Location: [facility];  Service: Ophthalmology;  Laterality: Left;  US  03:06.2 AP% 16.1 CDE 30.16 FLUID PACK LOT @ [id number] H   CATARACT EXTRACTION W/PHACO Right 07/13/2023    Procedure: CATARACT EXTRACTION PHACO AND INTRAOCULAR LENS PLACEMENT (IOC) RIGHT DIABETIC 4.00 00:36.6;  Surgeon: Myrna Adine Anes, MD;  Location: [facility];  Service: Ophthalmology;  Laterality: Right;   CIRCUMCISION  1998   COLONOSCOPY WITH PROPOFOL  N/A 08/20/2023   Procedure: COLONOSCOPY WITH PROPOFOL ;  Surgeon: Unk Corinn Skiff, MD;  Location: [facility];  Service: Gastroenterology;  Laterality: N/A;   EYE SURGERY Left    Retina Detachment   GRAFT APPLICATION Right 05/29/2017   Procedure: GRAFT APPLICATION ( RIGHT FOOT );  Surgeon: Shawl Cordella KANDICE, MD;  Location: [facility];  Service: Vascular;  Laterality: Right;  graft taken from patients right thigh   IRRIGATION AND DEBRIDEMENT FOOT Right 10/03/2016   Procedure: IRRIGATION AND DEBRIDEMENT FOOT;  Surgeon: Eva Gay, DPM;  Location: [facility];  Service: Podiatry;  Laterality: Right;   LOWER EXTREMITY ANGIOGRAPHY Right 02/24/2023   Procedure: Lower Extremity Angiography;  Surgeon: Shawl Cordella KANDICE, MD;  Location: [facility];  Service: Cardiovascular;  Laterality: Right;   LOWER EXTREMITY INTERVENTION  10/10/2016   Procedure: Lower Extremity Intervention;  Surgeon: Cordella KANDICE Shawl, MD;  Location: [facility];  Service: Cardiovascular;;   PERIPHERAL VASCULAR BALLOON ANGIOPLASTY Left 10/10/2016   Procedure: Peripheral Vascular Balloon Angioplasty;  Surgeon: Cordella KANDICE Shawl, MD;  Location: [facility];  Service: Cardiovascular;  Laterality: Left;   TONSILLECTOMY     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/2022   Procedure: TRANSMETATARSAL AMPUTATION;  Surgeon: Gay Eva, DPM;  Location: [facility];  Service: Podiatry;  Laterality: Right;   WOUND DEBRIDEMENT Right 11/07/2016   Procedure: DEBRIDEMENT OF WOUND AND BONE RIGHT FOOT AND APPLY WOUND VAC;  Surgeon: Eva Gay, DPM;  Location: [facility];  Service: Podiatry;  Laterality: Right;     Social History[1]   Family History  Problem Relation  Age of Onset   Diabetes Mother    Hypertension Mother    Diabetes Father    Hypertension Father    Cancer Father     Allergies[2]   REVIEW OF SYSTEMS (Negative unless checked)  Constitutional: [] Weight loss  [] Fever  [] Chills Cardiac: [] Chest pain   [] Chest pressure   [] Palpitations   [] Shortness of breath when laying flat   [] Shortness of breath at rest   [] Shortness of breath with exertion. Vascular:  [] Pain in legs with walking   [] Pain in legs at rest   [] Pain in legs when laying flat   [] Claudication   [] Pain in feet when walking  [] Pain in feet at rest  [] Pain in feet  when laying flat   [x] History of DVT   [] Phlebitis   [] Swelling in legs   [] Varicose veins   [x] Non-healing ulcers Pulmonary:   [] Uses home oxygen   [] Productive cough   [] Hemoptysis   [] Wheeze  [] COPD   [] Asthma Neurologic:  [] Dizziness  [] Blackouts   [] Seizures   [] History of stroke   [] History of TIA  [] Aphasia   [] Temporary blindness   [] Dysphagia   [] Weakness or numbness in arms   [] Weakness or numbness in legs Musculoskeletal:  [x] Arthritis   [] Joint swelling   [] Joint pain   [] Low back pain Hematologic:  [] Easy bruising  [] Easy bleeding   [] Hypercoagulable state   [x] Anemic  [] Hepatitis Gastrointestinal:  [] Blood in stool   [] Vomiting blood  [] Gastroesophageal reflux/heartburn   [] Difficulty swallowing. Genitourinary:  [x] Chronic kidney disease   [] Difficult urination  [] Frequent urination  [] Burning with urination   [] Blood in urine Skin:  [] Rashes   [x] Ulcers   [x] Wounds Psychological:  [] History of anxiety   []  History of major depression.  Physical Examination  Vitals:   09/26/24 1000 09/26/24 1100 09/26/24 1200 09/26/24 1201  BP: (!) 123/105 (!) 96/51 (!) 97/53 (!) 97/53  Pulse: 92 89 87 88  Resp: 19 18 17 18   Temp: (!) 100.8 F (38.2 C) (!) 100.6 F (38.1 C) (!) 100.6 F (38.1 C) (!) 100.6 F (38.1 C)  TempSrc:      SpO2: 100% 100% 99% 99%  Weight:      Height:       Body mass index is 33.58  kg/m. Gen:  WD/WN, NAD Head: [place]/AT, No temporalis wasting.  Ear/Nose/Throat: Hearing grossly intact, nares w/o erythema or drainage, oropharynx w/o Erythema/Exudate Eyes: Sclera non-icteric, conjunctiva clear Neck: Trachea midline.  No JVD.  Pulmonary:  Good air movement, respirations not labored, equal bilaterally.  Cardiac: RRR, normal S1, S2. Vascular:  Vessel Right Left  Radial Palpable Palpable                          PT 1+ Palpable 1+ Palpable  DP 2+ Palpable 2+ Palpable    Musculoskeletal:   Both feet dressed currently. Diffusely weak throughout Neurologic: Sensation grossly intact in extremities.  Symmetrical.  Speech is fluent. Motor exam as listed above. Psychiatric: Judgment intact, Mood & affect appropriate for pt's clinical situation. Dermatologic: Multiple ulcerations including sacrum, bilateral feet with pictures in the chart. Lymph : No Cervical, Axillary, or Inguinal lymphadenopathy.     CBC Lab Results  Component Value Date   WBC 3.5 (L) 09/26/2024   HGB 8.5 (L) 09/26/2024   HCT 26.1 (L) 09/26/2024   MCV 91.6 09/26/2024   PLT 244 09/26/2024    BMET    Component Value Date/Time   NA 140 09/26/2024 0439   NA 140 04/04/2013 1238   K 3.9 09/26/2024 0439   K 4.1 04/04/2013 1238   CL 105 09/26/2024 0439   CL 108 (H) 04/04/2013 1238   CO2 27 09/26/2024 0439   CO2 30 04/04/2013 1238   GLUCOSE 99 09/26/2024 0439   GLUCOSE 98 04/04/2013 1238   BUN 6 (L) 09/26/2024 0439   BUN 19 (H) 04/04/2013 1238   CREATININE 0.63 09/26/2024 0439   CREATININE 0.79 09/13/2024 0928   CREATININE 0.87 04/04/2013 1238   CALCIUM  8.4 (L) 09/26/2024 0439   CALCIUM  8.8 04/04/2013 1238   GFRNONAA >60 09/26/2024 0439   GFRNONAA >60 09/13/2024 0928   GFRNONAA >60 04/04/2013 1238   GFRAA >  60 05/22/2017 0853   GFRAA >60 04/04/2013 1238   Estimated Creatinine Clearance: 101.9 mL/min (by C-G formula based on SCr of 0.63 mg/dL).  COAG Lab Results  Component Value Date    INR 1.3 (H) 09/21/2024   INR 1.4 (H) 08/06/2024   INR 1.2 04/20/2024    Radiology ECHOCARDIOGRAM COMPLETE Result Date: 09/23/2024    ECHOCARDIOGRAM REPORT   Patient Name:   ZYMEIR SALMINEN Date of Exam: 09/23/2024 Medical Rec #:  [id number]         Height:       71.0 in Accession #:    [id number]        Weight:       228.6 lb Date of Birth:  [birth year]        BSA:          2.232 m Patient Age:    73 years          BP:           106/54 mmHg Patient Gender: M                 HR:           80 bpm. Exam Location:  [facility] Procedure: 2D Echo (Both Spectral and Color Flow Doppler were utilized during            procedure). Indications:     shock  History:         Patient has prior history of Echocardiogram examinations, most                  recent 10/07/2016. Chronic kidney disease and PAD; Risk                  Factors:Diabetes, Hypertension and Dyslipidemia.  Sonographer:     Tinnie Barefoot RDCS Referring Phys:  [id number] LONELL KANDICE MOOSE Diagnosing Phys: Redell Cave MD  Sonographer Comments: Technically difficult study due to poor echo windows and suboptimal subcostal window. Image acquisition challenging due to respiratory motion. IMPRESSIONS  1. Left ventricular ejection fraction, by estimation, is 60 to 65%. The left ventricle has normal function. The left ventricle has no regional wall motion abnormalities. Left ventricular diastolic parameters were normal.  2. Right ventricular systolic function is normal. The right ventricular size is not well visualized.  3. The mitral valve is normal in structure. No evidence of mitral valve regurgitation.  4. The aortic valve was not well visualized. Aortic valve regurgitation is not visualized. FINDINGS  Left Ventricle: Left ventricular ejection fraction, by estimation, is 60 to 65%. The left ventricle has normal function. The left ventricle has no regional wall motion abnormalities. Definity  contrast agent was given IV to delineate the left ventricular   endocardial borders. The left ventricular internal cavity size was normal in size. There is no left ventricular hypertrophy. Left ventricular diastolic parameters were normal. Right Ventricle: The right ventricular size is not well visualized. No increase in right ventricular wall thickness. Right ventricular systolic function is normal. Left Atrium: Left atrial size was normal in size. Right Atrium: Right atrial size was normal in size. Pericardium: There is no evidence of pericardial effusion. Mitral Valve: The mitral valve is normal in structure. No evidence of mitral valve regurgitation. Tricuspid Valve: The tricuspid valve is normal in structure. Tricuspid valve regurgitation is mild. Aortic Valve: The aortic valve was not well visualized. Aortic valve regurgitation is not visualized. Pulmonic Valve: The pulmonic valve was not well visualized. Pulmonic valve regurgitation is  not visualized. Aorta: The aortic root is normal in size and structure. Venous: The inferior vena cava was not well visualized. IAS/Shunts: No atrial level shunt detected by color flow Doppler.  LEFT VENTRICLE PLAX 2D LVIDd:         4.70 cm      Diastology LVIDs:         3.10 cm      LV e' medial:    7.94 cm/s LV PW:         1.00 cm      LV E/e' medial:  9.8 LV IVS:        1.00 cm      LV e' lateral:   10.10 cm/s LVOT diam:     2.10 cm      LV E/e' lateral: 7.7 LV SV:         73 LV SV Index:   33 LVOT Area:     3.46 cm LV IVRT:       95 msec  LV Volumes (MOD) LV vol d, MOD A2C: 114.0 ml LV vol d, MOD A4C: 79.8 ml LV vol s, MOD A2C: 25.5 ml LV vol s, MOD A4C: 31.9 ml LV SV MOD A2C:     88.5 ml LV SV MOD A4C:     79.8 ml LV SV MOD BP:      68.5 ml  PULMONARY VEINS Diastolic Velocity: 52.00 cm/s S/D Velocity:       1.70 Systolic Velocity:  86.20 cm/s LEFT ATRIUM             Index LA diam:        4.00 cm 1.79 cm/m LA Vol (A2C):   64.6 ml 28.94 ml/m LA Vol (A4C):   48.3 ml 21.64 ml/m LA Biplane Vol: 55.9 ml 25.05 ml/m  AORTIC VALVE LVOT  Vmax:   97.50 cm/s LVOT Vmean:  65.500 cm/s LVOT VTI:    0.211 m  AORTA Ao Root diam: 3.70 cm MITRAL VALVE               TRICUSPID VALVE MV Area (PHT): 2.99 cm    TR Peak grad:   35.8 mmHg MV Decel Time: 254 msec    TR Vmax:        299.00 cm/s MR Peak grad: 89.9 mmHg MR Vmax:      474.00 cm/s  SHUNTS MV E velocity: 77.60 cm/s  Systemic VTI:  0.21 m MV A velocity: 74.90 cm/s  Systemic Diam: 2.10 cm MV E/A ratio:  1.04 Redell Cave MD Electronically signed by Redell Cave MD Signature Date/Time: 09/23/2024/5:36:31 PM    Final    MR FOOT LEFT WO CONTRAST Result Date: 09/23/2024 CLINICAL DATA:  Chronic diabetic foot ulcers. Concern for osteomyelitis. EXAM: MRI OF THE LEFT FOOT WITHOUT CONTRAST TECHNIQUE: Multiplanar, multisequence MR imaging of the left foot was performed. No intravenous contrast was administered. COMPARISON:  Left foot radiographs dated 09/22/2024. FINDINGS: Bones/Joint/Cartilage No marrow signal abnormality identified to suggest acute osteomyelitis. No acute fracture or dislocation. 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 No significant joint effusion. Ligaments Lateral ankle ligaments appear intact. Fibrosis within the deltoid ligament likely reflects sequela of prior sprain. Lisfranc ligament is intact. Muscles and Tendons Flexor, peroneal and extensor compartment tendons are intact. Mild tenosynovitis of the flexor hallucis longus at the level of the knot of Henry. Generalized atrophy of the musculature. Soft tissue Moderate-sized soft tissue wound/ulceration of the lateral heel.  Suspected small ulceration of the lateral midfoot. No loculated fluid collection. IMPRESSION: 1. No evidence of acute osteomyelitis. 2. Moderate-sized soft tissue wound/ulceration of the lateral heel. Suspected small ulceration of the lateral midfoot. No loculated fluid collection. 3. Avascular necrosis of the navicular. 4.  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 Mild osteoarthritis of the hindfoot. 5. Mild tenosynovitis of the flexor hallucis longus tendon at the level of the knot of Henry. Electronically Signed   By: Harrietta Sherry M.D.   On: 09/23/2024 16:38   US  RENAL Result Date: 09/22/2024 CLINICAL DATA:  Acute on chronic renal failure. History of metastatic prostate cancer. EXAM: RENAL / URINARY TRACT ULTRASOUND COMPLETE COMPARISON:  PET-CT 06/01/2024 FINDINGS: Right Kidney: Renal measurements: 10.1 x 5.3 x 5.6 cm = volume: 156 mL. Echogenicity within normal limits. No mass or hydronephrosis visualized. 6.3 cm simple cyst over the mid pole. Left Kidney: Somewhat limited visualization of the left kidney due to body habitus and overlying bowel gas. Renal measurements: 10.2 x 6.9 x 4.8 cm = volume: 177 mL. Echogenicity within normal limits. No mass or hydronephrosis visualized. Bladder: Appears normal for degree of bladder distention. Foley catheter in place. Other: None. IMPRESSION: Normal size kidneys without hydronephrosis. 6.3 cm simple cyst over the mid pole right kidney. Electronically Signed   By: Toribio Agreste M.D.   On: 09/22/2024 08:58   DG Foot 2 Views Right Result Date: 09/22/2024 CLINICAL DATA:  Evaluate for osteomyelitis. 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 No evidence of bone destruction to suggest osteomyelitis. Degenerative changes over the midfoot and hindfoot. Small inferior calcaneal spur. Dorsal talar beaking at the talonavicular joint unchanged. Small vessel atherosclerotic disease is present. No significant air within the soft tissues. Soft tissue swelling over the plantar surface of the forefoot. IMPRESSION: 1. No acute findings. No evidence of osteomyelitis. 2. 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 Electronically Signed   By: Toribio Agreste M.D.   On: 09/22/2024 08:11   DG Foot 2  Views Left Result Date: 09/22/2024 CLINICAL DATA:  Evaluate for osteomyelitis. EXAM: LEFT FOOT - 2 VIEW COMPARISON:  07/24/2024 FINDINGS: Mild degenerative changes over the first MTP joint. Mild degenerate change over the midfoot and hindfoot. Small inferior calcaneal spur is present. There is small vessel atherosclerosis. No evidence of bone destruction to suggest osteomyelitis. No acute fracture or dislocation. IMPRESSION: 1. No acute findings. No evidence of osteomyelitis. 2. Mild degenerative changes as described. Electronically Signed   By: Toribio Agreste M.D.   On: 09/22/2024 08:09   DG Chest Port 1 View Result Date: 09/22/2024 CLINICAL DATA:  Central line EXAM: PORTABLE CHEST 1 VIEW COMPARISON:  07/26/2024 FINDINGS: Hypoventilatory changes with probable atelectasis at the left base. Stable cardiomediastinal silhouette. Right IJ central venous catheter tip at the cavoatrial region. No pneumothorax IMPRESSION: Right IJ central venous catheter tip at the cavoatrial region. No pneumothorax. Hypoventilatory changes with probable atelectasis at the left base. Electronically Signed   By: Luke Bun M.D.   On: 09/22/2024 02:03      Assessment/Plan 1. Ulceration BLE, worse on the left.  This is clearly a critical and limb threatening situation.  Heel ulcerations are difficult to get to heal due to the relative avascularity of the heel and the difficulty of avoiding pressure.  Although his anterior tibial/dorsalis pedis pulses strongly palpable, he had known medial calcification 4 years ago and I think an angiogram would be  prudent to ensure that there is not some level of disease that could be improved upon to help healing.  The dorsalis pedis does not supply the heel at all, and this is largely based off of the peroneal and posterior tibial arteries.  I have discussed the serious nature of the situation with the patient.  This is a relatively stable wound and does not have to be done immediately, but we  will work on getting him on the schedule likely for Wednesday or potentially Thursday of this week for an angiogram of the left lower extremity 2. Diabetes. blood glucose control important in reducing the progression of atherosclerotic disease. Also, involved in wound healing. On appropriate medications. 3. Hypertension. blood pressure control important in reducing the progression of atherosclerotic disease. On appropriate oral medications. 4.  Hyperlipidemia. lipid control important in reducing the progression of atherosclerotic disease. Continue statin therapy    Selinda Gu, MD  09/26/2024 12:51 PM    This note was created with Dragon medical transcription system.  Any error is purely unintentional    [1]  Social History Tobacco Use   Smoking status: Former    Types: Pipe, Cigarettes    Quit date: 05/23/1975    Years since quitting: 49.3   Smokeless tobacco: Never  Vaping Use   Vaping status: Never Used  Substance Use Topics   Alcohol use: No   Drug use: No  [2]  Allergies Allergen Reactions   Sulfa Antibiotics Rash   Piperacillin -Tazobactam In Dex    "

## 2024-09-26 NOTE — Progress Notes (Signed)
 "  NAME:  [name], MRN:  [id number], DOB:  1951-04-15, LOS: 5 ADMISSION DATE:  09/21/2024, CHIEF COMPLAINT:  Hypotension   History of Present Illness:   74 year old male with a past medical history significant for hypotension on midodrine , former HTN, HLD, CKD Stage III and metastatic prostate cancer, admitted for septic shock in the setting of urosepsis with rhabdomyolysis vs pneumonia.  [name] is a 74 year old male with a past medical history significant for hypotension on midodrine , former HTN, HLD, CKD Stage III, bladder incontinence and metastatic prostate cancer who presented to [facility] ED on 1/21 from an outpatient podiatry appointment for evaluation of hypotension.   History gathered per chart review and bedside evaluation It is reported he is wheelchair bound at baseline and recently was discharged from a rehab facility following hospitalization. [name] reported a recent fall on Monday 1/19 when getting up from his recliner. He denied loss of consciousness or hitting his head. He reports he has been feeling weak in general for about a week, but more noticeable after the fall. He denied symptoms of chest pain, cough, nausea/vomiting, dysuria, or flank pain. He did endorse mild shortness of breath but this is chronic. Additionally, he reported some intermittent diarrhea but has been going on for a few months on and off. His sister was with him on arrival and reported trying to get him to be evaluated at the ED the day of his fall but he declined. After arrival to his outpatient podiatry appointment for his chronic diabetic foot ulcers, he was found to be very hypotensive and was sent to the ED.    ED course: Upon arrival to [facility] ED, he was significantly hypotensive and was started on IV fluids for resuscitation. He was minimally responsive to fluids at that time and was started on peripheral levophed . Code sepsis was activated and he was covered with cefepime  and  vancomycin .   Pertinent  Medical History  Chronic hypotension on Midodrine  HTN - historical Hyperlipidemia CKD Stage 3 Metastatic Prostate Cancer Type II Diabetes Mellitus PVD Previous DVT 2018 Bladder Incontinence Arthritis  Significant Hospital Events: Including procedures, antibiotic start and stop dates in addition to other pertinent events   1/21: Admitted for circulatory/septic shock requiring levophed  gtt. Placed on bair hugger for hypothermia and given cefepime  + vancomycin . PCCM consulted for hypotension requiring vasopressors. Severe metabolic acidosis. Given 2 amps. 09/22/24- remains critically ill on levophed  15/vasopressin  0.04 not on MV. 09/23/24 - MRI foot without osteo, weaning down vasopressors 09/24/24 - pressors further weaned 09/25/24 - off pressors this AM, denies pain  Interim History / Subjective:  No acute events  Objective    Blood pressure (!) 97/53, pulse 88, temperature (!) 100.6 F (38.1 C), resp. rate 18, height 5' 11 (1.803 m), weight 109.2 kg, SpO2 99%.        Intake/Output Summary (Last 24 hours) at 09/26/2024 1310 Last data filed at 09/26/2024 1233 Gross per 24 hour  Intake 723.49 ml  Output 3300 ml  Net -2576.51 ml   Filed Weights   09/24/24 0411 09/25/24 0500 09/26/24 0456  Weight: 109 kg 107.2 kg 109.2 kg    Examination: Physical Exam Constitutional:      Appearance: He is ill-appearing.  Cardiovascular:     Rate and Rhythm: Normal rate and regular rhythm.     Pulses: Normal pulses.     Heart sounds: Normal heart sounds.  Pulmonary:     Effort: Pulmonary effort is normal.  Breath sounds: Normal breath sounds.  Neurological:     General: No focal deficit present.     Mental Status: He is alert. Mental status is at baseline.      Assessment and Plan   #Toxic Metabolic Encephalopathy #Circulatory Shock #UTI #Rhabdomyolysis #AKI on CKD #T2DM #Metastatic Prostate Cancer  Neurology - no active issue at this point,  patient without reported pain.  Cardiovascular - septic shock likely secondary to infected ulcer. Received nor-epinephrine  and vasopressin  for BP control, now both discontinued. TTE with normal LV/RV function.  -continue to wean pressors, MAP > 60 mmHg or SBP > 90  -add midodrine   Pulmonary - on room air, no active issues  Gastrointestinal - liberalized diet, tolerating well  Renal - kidney function at baseline, without electrolyte abnormalities.  Endocrine - ICU glycemic protocol. AM cortisol yesterday was within normal. Could still consider   Hem/Onc - heparin  subQ for DVT prophylaxis  ID - continued on antibiotics for concern for cellulitis, with linezolid  added. No sign of osteomyelitis on his foot MRI. Will consult vascular for evaluation of his lower extremity.   Labs   CBC: Recent Labs  Lab 09/21/24 1629 09/22/24 0002 09/22/24 0144 09/23/24 0420 09/24/24 0359 09/25/24 0826 09/26/24 0439  WBC 9.6   < > 10.3 5.8 4.4 3.0* 3.5*  NEUTROABS 8.9*  --   --   --   --   --  2.6  HGB 9.7*   < > 8.9* 8.4* 7.3* 8.6* 8.5*  HCT 30.6*   < > 26.8* 24.5* 21.8* 25.5* 26.1*  MCV 93.9   < > 91.2 88.4 89.7 90.4 91.6  PLT 319   < > 302 255 203 211 244   < > = values in this interval not displayed.    Basic Metabolic Panel: Recent Labs  Lab 09/22/24 0144 09/22/24 9391 09/22/24 2059 09/23/24 0420 09/24/24 0359 09/25/24 0349 09/26/24 0439  NA 140   < > 138 139 131* 140 140  K 4.8   < > 4.4 4.1 3.4* 3.3* 3.9  CL 106   < > 104 105 95* 105 105  CO2 13*   < > 21* 22 26 26 27   GLUCOSE 133*   < > 160* 151* 192* 155* 99  BUN 60*   < > 37* 28* 17 10 6*  CREATININE 2.09*   < > 1.15 0.93 0.65 0.62 0.63  CALCIUM  7.9*   < > 7.5* 8.1* 7.7* 8.0* 8.4*  MG 1.4*  --   --  1.7 1.5* 1.8 1.7  PHOS 4.6  --   --  2.1* 1.7* 2.0* 2.0*   < > = values in this interval not displayed.   GFR: Estimated Creatinine Clearance: 101.9 mL/min (by C-G formula based on SCr of 0.63 mg/dL). Recent Labs  Lab  09/22/24 0002 09/22/24 0144 09/22/24 9391 09/22/24 1505 09/22/24 1806 09/23/24 0420 09/24/24 0359 09/25/24 0826 09/26/24 0439  PROCALCITON 0.30  --   --   --   --   --   --   --   --   WBC 10.3 10.3  --   --   --  5.8 4.4 3.0* 3.5*  LATICACIDVEN >9.0* 5.3* 2.9* 1.1 1.0  --   --   --   --     Liver Function Tests: Recent Labs  Lab 09/21/24 1629 09/22/24 0144  AST 91* 95*  ALT 22 20  ALKPHOS 136* 120  BILITOT 0.2 0.2  PROT 5.9* 5.3*  ALBUMIN  3.3*  2.8*   No results for input(s): LIPASE, AMYLASE in the last 168 hours. No results for input(s): AMMONIA in the last 168 hours.  ABG    Component Value Date/Time   HCO3 21.2 09/22/2024 2329   ACIDBASEDEF 3.1 (H) 09/22/2024 2329   O2SAT 63.1 09/22/2024 2329     Coagulation Profile: Recent Labs  Lab 09/21/24 1629  INR 1.3*    Cardiac Enzymes: Recent Labs  Lab 09/22/24 0608 09/23/24 0420 09/24/24 0359 09/25/24 0349 09/26/24 0439  CKTOTAL 1,442* 635* 389 217 137    HbA1C: Hgb A1c MFr Bld  Date/Time Value Ref Range Status  07/26/2024 07:22 AM 5.6 4.8 - 5.6 % Final    Comment:    (NOTE)         Prediabetes: 5.7 - 6.4         Diabetes: >6.4         Glycemic control for adults with diabetes: <7.0   04/17/2024 02:36 AM 6.7 (H) 4.8 - 5.6 % Final    Comment:    (NOTE)         Prediabetes: 5.7 - 6.4         Diabetes: >6.4         Glycemic control for adults with diabetes: <7.0     CBG: Recent Labs  Lab 09/25/24 2044 09/25/24 2358 09/26/24 0327 09/26/24 0803 09/26/24 1218  GLUCAP 85 209* 86 124* 138*    Review of Systems:   N/A  Past Medical History:  He,  has a past medical history of Arthritis, Bladder incontinence, Chronic kidney disease, Diabetes mellitus without complication (HCC), DVT of leg (deep venous thrombosis) (HCC), Dyspnea, Hyperlipidemia, Hypertension, Peripheral vascular disease, and Sepsis (HCC).   Surgical History:   Past Surgical History:  Procedure Laterality Date    ABDOMINAL AORTOGRAM W/LOWER EXTREMITY N/A 10/10/2016   Procedure: Abdominal Aortogram w/Lower Extremity;  Surgeon: Cordella KANDICE Shawl, MD;  Location: [facility] IN[facility]ASIVE CV [facility];  Service: Cardiovascular;  Laterality: N/A;   CATARACT EXTRACTION W/PHACO Left 05/27/2018   Procedure: CATARACT EXTRACTION PHACO AND INTRAOCULAR LENS PLACEMENT (IOC);  Surgeon: Myrna Adine Anes, MD;  Location: [facility] ORS;  Service: Ophthalmology;  Laterality: Left;  US  03:06.2 AP% 16.1 CDE 30.16 FLUID PACK LOT @ [id number] H   CATARACT EXTRACTION W/PHACO Right 07/13/2023   Procedure: CATARACT EXTRACTION PHACO AND INTRAOCULAR LENS PLACEMENT (IOC) RIGHT DIABETIC 4.00 00:36.6;  Surgeon: Myrna Adine Anes, MD;  Location: [facility];  Service: Ophthalmology;  Laterality: Right;   CIRCUMCISION  1998   COLONOSCOPY WITH PROPOFOL  N/A 08/20/2023   Procedure: COLONOSCOPY WITH PROPOFOL ;  Surgeon: Unk Corinn Skiff, MD;  Location: [facility] ENDOSCOPY;  Service: Gastroenterology;  Laterality: N/A;   EYE SURGERY Left    Retina Detachment   GRAFT APPLICATION Right 05/29/2017   Procedure: GRAFT APPLICATION ( RIGHT FOOT );  Surgeon: Shawl Cordella KANDICE, MD;  Location: [facility] ORS;  Service: Vascular;  Laterality: Right;  graft taken from patients right thigh   IRRIGATION AND DEBRIDEMENT FOOT Right 10/03/2016   Procedure: IRRIGATION AND DEBRIDEMENT FOOT;  Surgeon: Eva Gay, DPM;  Location: [facility] ORS;  Service: Podiatry;  Laterality: Right;   LOWER EXTREMITY ANGIOGRAPHY Right 02/24/2023   Procedure: Lower Extremity Angiography;  Surgeon: Shawl Cordella KANDICE, MD;  Location: [facility];  Service: Cardiovascular;  Laterality: Right;   LOWER EXTREMITY INTERVENTION  10/10/2016   Procedure: Lower Extremity Intervention;  Surgeon: Cordella KANDICE Shawl, MD;  Location: [facility];  Service: Cardiovascular;;   PERIPHERAL VASCULAR  BALLOON ANGIOPLASTY Left 10/10/2016   Procedure: Peripheral Vascular Balloon Angioplasty;  Surgeon: Cordella KANDICE Shawl,  MD;  Location: [facility] INV[facility];  Service: Cardiovascular;  Laterality: Left;   TONSILLECTOMY     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/2022   Procedure: TRANSMETATARSAL AMPUTATION;  Surgeon: Ashley Soulier, DPM;  Location: [facility] ORS;  Service: Podiatry;  Laterality: Right;   WOUND DEBRIDEMENT Right 11/07/2016   Procedure: DEBRIDEMENT OF WOUND AND BONE RIGHT FOOT AND APPLY WOUND VAC;  Surgeon: Soulier Ashley, DPM;  Location: [facility] ORS;  Service: Podiatry;  Laterality: Right;     Social History:   reports that he quit smoking about 49 years ago. His smoking use included pipe and cigarettes. He has never used smokeless tobacco. He reports that he does not drink alcohol and does not use drugs.   Family History:  His family history includes Cancer in his father; Diabetes in his father and mother; Hypertension in his father and mother.   Allergies Allergies[1]   Home Medications  Prior to Admission medications  Medication Sig Start Date End Date Taking? Authorizing Provider  acetaminophen  (TYLENOL ) 325 MG tablet Take 2 tablets (650 mg total) by mouth every 6 (six) hours as needed for mild pain (pain score 1-3) or fever (or Fever >/= 101). 08/12/24  Yes Alexander, Natalie, DO  ascorbic Acid  (VITAMIN C ) 500 MG CPCR Take 500 mg by mouth daily.   Yes [provider]  aspirin  EC 81 MG tablet Take 81 mg by mouth daily. Swallow whole.   Yes [provider]  atorvastatin  (LIPITOR) 10 MG tablet Take 10 mg by mouth every evening. 06/12/20  Yes [provider]  collagenase  (SANTYL ) 250 UNIT/GM ointment Apply topically daily. To wound / pressure ulcers 08/13/24  Yes Alexander, Natalie, DO  cyanocobalamin  1000 MCG tablet Take 1 tablet (1,000 mcg total) by mouth daily. 08/13/24  Yes Alexander, Natalie, DO  enzalutamide  (XTANDI ) 40 MG tablet Take 4 tablets (160 mg total) by mouth daily. 09/13/24  Yes Brahmanday, Govinda R, MD  loperamide  (IMODIUM ) 2 MG capsule Take 2 capsules (4 mg  total) by mouth every 8 (eight) hours as needed for diarrhea or loose stools. 08/12/24  Yes Alexander, Natalie, DO  LORazepam  (ATIVAN ) 1 MG tablet Take 1 tablet (1 mg total) by mouth every 8 (eight) hours. 08/12/24  Yes Alexander, Natalie, DO  metFORMIN  (GLUCOPHAGE ) 1000 MG tablet Take 1,000 mg by mouth 2 (two) times daily with a meal. 06/12/20  Yes [provider]  midodrine  (PROAMATINE ) 5 MG tablet Take 1 tablet (5 mg total) by mouth 3 (three) times daily with meals. 08/12/24  Yes Alexander, Natalie, DO  ondansetron  (ZOFRAN ) 4 MG tablet Take 1 tablet (4 mg total) by mouth every 6 (six) hours as needed for nausea. 08/12/24  Yes Alexander, Natalie, DO  predniSONE  (DELTASONE ) 20 MG tablet Take 3 tablets (60 mg total) by mouth daily with breakfast. Further refill / taper instructions per oncology Patient taking differently: Take 40 mg by mouth daily with breakfast. Further refill / taper instructions per oncology 08/13/24  Yes Marsa Edelman, DO  TRADJENTA  5 MG TABS tablet Take 5 mg by mouth daily. 09/09/24  Yes [provider]  Zinc  220 (50 Zn) MG CAPS Take 220 mg by mouth daily.   Yes [provider]     The patient is critically ill due to septic shock.  Critical care was necessary to treat or prevent imminent or life-threatening deterioration. I personally performed titration, monitoring, and management of vasopressor/ionotrope  infusion. Critical care time was spent by me on the following activities: development of a treatment plan with the patient and/or surrogate as well as nursing, discussions with consultants, evaluation of the patient's response to treatment, examination of the patient, obtaining a history from the patient or surrogate, ordering and performing treatments and interventions, ordering and review of laboratory studies, ordering and review of radiographic studies, review of telemetry data including pulse oximetry, re-evaluation of patient's condition and  participation in multidisciplinary rounds.   I personally spent 31 minutes providing critical care not including any separately billable procedures.   Belva November, MD [facility] 09/26/2024 7:28 PM          [1]  Allergies Allergen Reactions   Sulfa Antibiotics Rash   Piperacillin -Tazobactam In Dex    "

## 2024-09-26 NOTE — Plan of Care (Signed)
" °  Problem: Education: Goal: Ability to describe self-care measures that may prevent or decrease complications (Diabetes Survival Skills Education) will improve Outcome: Progressing   Problem: Coping: Goal: Ability to adjust to condition or change in health will improve Outcome: Progressing   Problem: Nutritional: Goal: Maintenance of adequate nutrition will improve Outcome: Progressing   Problem: Education: Goal: Knowledge of General Education information will improve Description: Including pain rating scale, medication(s)/side effects and non-pharmacologic comfort measures Outcome: Progressing   Problem: Clinical Measurements: Goal: Diagnostic test results will improve Outcome: Progressing Goal: Respiratory complications will improve Outcome: Progressing Goal: Cardiovascular complication will be avoided Outcome: Progressing   Problem: Pain Managment: Goal: General experience of comfort will improve and/or be controlled Outcome: Progressing   Problem: Safety: Goal: Ability to remain free from injury will improve Outcome: Progressing   Problem: Skin Integrity: Goal: Risk for impaired skin integrity will decrease Outcome: Not Progressing   "

## 2024-09-27 DIAGNOSIS — L97519 Non-pressure chronic ulcer of other part of right foot with unspecified severity: Secondary | ICD-10-CM | POA: Diagnosis not present

## 2024-09-27 DIAGNOSIS — L97429 Non-pressure chronic ulcer of left heel and midfoot with unspecified severity: Secondary | ICD-10-CM | POA: Diagnosis not present

## 2024-09-27 DIAGNOSIS — E1151 Type 2 diabetes mellitus with diabetic peripheral angiopathy without gangrene: Secondary | ICD-10-CM | POA: Diagnosis not present

## 2024-09-27 DIAGNOSIS — I70235 Atherosclerosis of native arteries of right leg with ulceration of other part of foot: Secondary | ICD-10-CM | POA: Diagnosis not present

## 2024-09-27 DIAGNOSIS — L8990 Pressure ulcer of unspecified site, unspecified stage: Secondary | ICD-10-CM | POA: Insufficient documentation

## 2024-09-27 DIAGNOSIS — T796XXA Traumatic ischemia of muscle, initial encounter: Secondary | ICD-10-CM | POA: Insufficient documentation

## 2024-09-27 LAB — GLUCOSE, CAPILLARY
Glucose-Capillary: 119 mg/dL — ABNORMAL HIGH (ref 70–99)
Glucose-Capillary: 130 mg/dL — ABNORMAL HIGH (ref 70–99)
Glucose-Capillary: 216 mg/dL — ABNORMAL HIGH (ref 70–99)
Glucose-Capillary: 200 mg/dL — ABNORMAL HIGH (ref 70–99)
Glucose-Capillary: 172 mg/dL — ABNORMAL HIGH (ref 70–99)

## 2024-09-27 LAB — BASIC METABOLIC PANEL WITH GFR
Anion gap: 9 (ref 5–15)
BUN: 7 mg/dL — ABNORMAL LOW (ref 8–23)
CO2: 25 mmol/L (ref 22–32)
Calcium: 8.4 mg/dL — ABNORMAL LOW (ref 8.9–10.3)
Chloride: 104 mmol/L (ref 98–111)
Creatinine, Ser: 0.61 mg/dL (ref 0.61–1.24)
GFR, Estimated: >60 mL/min
Glucose, Bld: 130 mg/dL — ABNORMAL HIGH (ref 70–99)
Potassium: 4.1 mmol/L (ref 3.5–5.1)
Sodium: 138 mmol/L (ref 135–145)

## 2024-09-27 LAB — CBC
HCT: 24.3 % — ABNORMAL LOW (ref 39.0–52.0)
Hemoglobin: 7.6 g/dL — ABNORMAL LOW (ref 13.0–17.0)
MCH: 29.6 pg (ref 26.0–34.0)
MCHC: 31.3 g/dL (ref 30.0–36.0)
MCV: 94.6 fL (ref 80.0–100.0)
Platelets: 205 10*3/uL (ref 150–400)
RBC: 2.57 MIL/uL — ABNORMAL LOW (ref 4.22–5.81)
RDW: 17.9 % — ABNORMAL HIGH (ref 11.5–15.5)
WBC: 2.9 10*3/uL — ABNORMAL LOW (ref 4.0–10.5)
nRBC: 0 % (ref 0.0–0.2)

## 2024-09-27 LAB — LACTIC ACID, PLASMA: Lactic Acid, Venous: 0.9 mmol/L (ref 0.5–1.9)

## 2024-09-27 LAB — PHOSPHORUS: Phosphorus: 2.2 mg/dL — ABNORMAL LOW (ref 2.5–4.6)

## 2024-09-27 LAB — CK: Total CK: 84 U/L (ref 49–397)

## 2024-09-27 LAB — MAGNESIUM: Magnesium: 1.6 mg/dL — ABNORMAL LOW (ref 1.7–2.4)

## 2024-09-27 MED ORDER — LACTATED RINGERS IV SOLN
INTRAVENOUS | Status: DC
  Administered 2024-09-28: 1000 mL via INTRAVENOUS

## 2024-09-27 MED ORDER — LACTATED RINGERS IV BOLUS
500.0000 mL | Freq: Once | INTRAVENOUS | Status: AC
  Administered 2024-09-27: 500 mL via INTRAVENOUS

## 2024-09-27 MED ORDER — MAGNESIUM SULFATE 2 GM/50ML IV SOLN
2.0000 g | Freq: Once | INTRAVENOUS | Status: AC
  Administered 2024-09-27: 2 g via INTRAVENOUS
  Filled 2024-09-27: qty 50

## 2024-09-27 MED ORDER — SENNOSIDES-DOCUSATE SODIUM 8.6-50 MG PO TABS
2.0000 | ORAL_TABLET | Freq: Two times a day (BID) | ORAL | Status: DC
  Administered 2024-09-27 – 2024-10-02 (×4): 2 via ORAL
  Filled 2024-09-27 (×9): qty 2

## 2024-09-27 NOTE — Hospital Course (Signed)
 [name] is a 74 year old male with a past medical history significant for hypotension on midodrine , former HTN, HLD, CKD Stage III, bladder incontinence and metastatic prostate cancer who presented to [facility] ED on 1/21 from an outpatient podiatry appointment for evaluation of hypotension.  Upon arriving the hospital, patient was diagnosed with a septic shock, was placed on Levophed  in ICU.  Source of infection was thought to be secondary to left heel ulceration with infection.  Initially treated with cefepime  and vancomycin , changed to Zyvox . Patient was seen by podiatry, MRI of the foot did not show any evidence of osteomyelitis. Patient is doing better, pressor was weaned off on 1/25, transferred to medical floor on 1/26. Patient also has history of peripheral vascular disease, vascular surgery to perform angiogram on 1/28.

## 2024-09-27 NOTE — Care Management Important Message (Signed)
 Important Message  Patient Details  Name: [name] MRN: [id number] Date of Birth: 03/20/1951   Important Message Given:  Yes - Medicare IM     Shterna Laramee W, CMA 09/27/2024, 2:03 PM

## 2024-09-27 NOTE — Progress Notes (Signed)
 Patient transferred on telemetry to room 142, all patient belongings transferred with patient via bed.

## 2024-09-27 NOTE — H&P (View-Only) (Signed)
 [facility]  Daily Progress Note   Subjective  -   No events overnight.  Has moved to the floor.  No new complaints.  Feet not particularly painful.  Remains in [place] boots.  Objective Vitals:   09/27/24 0615 09/27/24 0630 09/27/24 0720 09/27/24 0756  BP: (!) 93/53  (!) 157/144 (!) 97/49  Pulse: 65 65 72 75  Resp: 15 16 16 16   Temp: 98.6 F (37 C) 98.4 F (36.9 C) 97.9 F (36.6 C)   TempSrc:   Oral   SpO2: 99% 97% 99% 100%  Weight:      Height:        Intake/Output Summary (Last 24 hours) at 09/27/2024 0957 Last data filed at 09/27/2024 0600 Gross per 24 hour  Intake 1819.28 ml  Output 1350 ml  Net 469.28 ml    PULM  CTAB CV  RRR VASC  feet are warm.  Palpable dorsalis pedis pulses.  Laboratory CBC    Component Value Date/Time   WBC 2.9 (L) 09/27/2024 0512   HGB 7.6 (L) 09/27/2024 0512   HGB 8.9 (L) 09/13/2024 0929   HCT 24.3 (L) 09/27/2024 0512   PLT 205 09/27/2024 0512   PLT 264 09/13/2024 0929    BMET    Component Value Date/Time   NA 138 09/27/2024 0512   NA 140 04/04/2013 1238   K 4.1 09/27/2024 0512   K 4.1 04/04/2013 1238   CL 104 09/27/2024 0512   CL 108 (H) 04/04/2013 1238   CO2 25 09/27/2024 0512   CO2 30 04/04/2013 1238   GLUCOSE 130 (H) 09/27/2024 0512   GLUCOSE 98 04/04/2013 1238   BUN 7 (L) 09/27/2024 0512   BUN 19 (H) 04/04/2013 1238   CREATININE 0.61 09/27/2024 0512   CREATININE 0.79 09/13/2024 0928   CREATININE 0.87 04/04/2013 1238   CALCIUM  8.4 (L) 09/27/2024 0512   CALCIUM  8.8 04/04/2013 1238   GFRNONAA >60 09/27/2024 0512   GFRNONAA >60 09/13/2024 0928   GFRNONAA >60 04/04/2013 1238   GFRAA >60 05/22/2017 0853   GFRAA >60 04/04/2013 1238    Assessment/Planning:   Bilateral lower extremity ulcerations, left worse than right. For left lower extremity angiogram tomorrow. Risks and benefits discussed with the patient.   [name]  09/27/2024, 9:57 AM

## 2024-09-27 NOTE — Progress Notes (Signed)
 " Progress Note   Patient: [name] FMW:[id number] DOB: [birth year] DOA: 09/21/2024     6 DOS: the patient was seen and examined on 09/27/2024   Brief hospital course: Madaline FLETCHER Goody is a 74 year old male with a past medical history significant for hypotension on midodrine , former HTN, HLD, CKD Stage III, bladder incontinence and metastatic prostate cancer who presented to [facility] ED on 1/21 from an outpatient podiatry appointment for evaluation of hypotension.  Upon arriving the hospital, patient was diagnosed with a septic shock, was placed on Levophed  in ICU.  Source of infection was thought to be secondary to left heel ulceration with infection.  Initially treated with cefepime  and vancomycin , changed to Zyvox . Patient was seen by podiatry, MRI of the foot did not show any evidence of osteomyelitis. Patient is doing better, pressor was weaned off on 1/25, transferred to medical floor on 1/26. Patient also has history of peripheral vascular disease, vascular surgery to perform angiogram on 1/28.   Principal Problem:   Septic shock (HCC) Active Problems:   Anemia of chronic disease   Heel ulcer (HCC)   Infected ulcer of skin (HCC)   PAD (peripheral artery disease)   AKI (acute kidney injury)   Hypomagnesemia   Pressure injury of skin   Hypophosphatemia   Traumatic rhabdomyolysis   Assessment and Plan: Septic shock secondary to left heel wound infection. Left heel wound infection. Peripheral vascular disease. UTI ruled out. Reviewed chart, patient had a fever while in the hospital, also had hypothermia.  And leukopenia.  he also had acute kidney injury, severe metabolic acidosis with a lactic acid level more than 9.0.  He was also requiring pressor for 3 days.  This is consistent with septic shock, the source of infection thought to be secondary to left heel wound. Blood culture negative, wound culture was not sent out.  Urine culture also negative. Patient currently on  Zyvox .  Condition improving. Patient is scheduled for peripheral vascular angiogram tomorrow. Will continue current treatment. Check a.m. cortisol level to rule out adrenal insufficiency.  Acute metabolic cephalopathy.  Not a toxic encephalopathy Patient had a significant confusion on time admission, condition has so far improved.  Acute kidney injury secondary to septic shock. Severe metabolic acidosis with a lactic acidosis. Hypomagnesemia. Hypophosphatemia. Hyponatremia. Hypokalemia. Chronic kidney disease stage III rule out. Reviewed the chart, patient does not have chronic kidney disease stage III.  So far renal function has normalized.  Potassium and sodium level also normalized.  Continue replete phosphate and magnesium .  Recent fall with traumatic rhabdomyolysis. CK level has normalized, obtain PT/OT, may need nursing home placement.  Anemia of chronic disease. Leukopenia secondary to infection. Patient had a recent iron  study, B12 and folic acid, did not show any deficiency. Anemia appears to be secondary to chronic disease.  Hemoglobin 7.6 today, will recheck level tomorrow, transfuse with hemoglobin less than 7.  Type 2 diabetes. Glucose not significant elevated.  Elevated troponin secondary to septic shock. No need for additional workup.    Subjective:  Patient doing better today, denied any short of breath.  No nausea vomiting abdominal pain.  Has some constipation  Physical Exam: Vitals:   09/27/24 0615 09/27/24 0630 09/27/24 0720 09/27/24 0756  BP: (!) 93/53  (!) 157/144 (!) 97/49  Pulse: 65 65 72 75  Resp: 15 16 16 16   Temp: 98.6 F (37 C) 98.4 F (36.9 C) 97.9 F (36.6 C)   TempSrc:   Oral   SpO2:  99% 97% 99% 100%  Weight:      Height:       General exam: Appears calm and comfortable  Respiratory system: Clear to auscultation. Respiratory effort normal. Cardiovascular system: S1 & S2 heard, RRR. No JVD, murmurs, rubs, gallops or clicks. No pedal  edema. Gastrointestinal system: Abdomen is nondistended, soft and nontender. No organomegaly or masses felt. Normal bowel sounds heard. Central nervous system: Alert and oriented x3. No focal neurological deficits. Extremities: Bilateral heel ulcers Skin: No rashes, lesions or ulcers Psychiatry: Mood & affect appropriate.    Data Reviewed:  Reviewed MRI results, chest x-ray and lab results.  Family Communication: None  Disposition: Status is: Inpatient Remains inpatient appropriate because: Severity of disease, inpatient procedure.     Time spent: 55 minutes  Author: Murvin Mana, MD 09/27/2024 10:58 AM  For on call review [email or web address].    "

## 2024-09-27 NOTE — Progress Notes (Signed)
 [facility]  Daily Progress Note   Subjective  -   No events overnight.  Has moved to the floor.  No new complaints.  Feet not particularly painful.  Remains in [place] boots.  Objective Vitals:   09/27/24 0615 09/27/24 0630 09/27/24 0720 09/27/24 0756  BP: (!) 93/53  (!) 157/144 (!) 97/49  Pulse: 65 65 72 75  Resp: 15 16 16 16   Temp: 98.6 F (37 C) 98.4 F (36.9 C) 97.9 F (36.6 C)   TempSrc:   Oral   SpO2: 99% 97% 99% 100%  Weight:      Height:        Intake/Output Summary (Last 24 hours) at 09/27/2024 0957 Last data filed at 09/27/2024 0600 Gross per 24 hour  Intake 1819.28 ml  Output 1350 ml  Net 469.28 ml    PULM  CTAB CV  RRR VASC  feet are warm.  Palpable dorsalis pedis pulses.  Laboratory CBC    Component Value Date/Time   WBC 2.9 (L) 09/27/2024 0512   HGB 7.6 (L) 09/27/2024 0512   HGB 8.9 (L) 09/13/2024 0929   HCT 24.3 (L) 09/27/2024 0512   PLT 205 09/27/2024 0512   PLT 264 09/13/2024 0929    BMET    Component Value Date/Time   NA 138 09/27/2024 0512   NA 140 04/04/2013 1238   K 4.1 09/27/2024 0512   K 4.1 04/04/2013 1238   CL 104 09/27/2024 0512   CL 108 (H) 04/04/2013 1238   CO2 25 09/27/2024 0512   CO2 30 04/04/2013 1238   GLUCOSE 130 (H) 09/27/2024 0512   GLUCOSE 98 04/04/2013 1238   BUN 7 (L) 09/27/2024 0512   BUN 19 (H) 04/04/2013 1238   CREATININE 0.61 09/27/2024 0512   CREATININE 0.79 09/13/2024 0928   CREATININE 0.87 04/04/2013 1238   CALCIUM  8.4 (L) 09/27/2024 0512   CALCIUM  8.8 04/04/2013 1238   GFRNONAA >60 09/27/2024 0512   GFRNONAA >60 09/13/2024 0928   GFRNONAA >60 04/04/2013 1238   GFRAA >60 05/22/2017 0853   GFRAA >60 04/04/2013 1238    Assessment/Planning:   Bilateral lower extremity ulcerations, left worse than right. For left lower extremity angiogram tomorrow. Risks and benefits discussed with the patient.   Selinda Gu  09/27/2024, 9:57 AM

## 2024-09-27 NOTE — Plan of Care (Signed)
" °  Problem: Education: Goal: Ability to describe self-care measures that may prevent or decrease complications (Diabetes Survival Skills Education) will improve Outcome: Progressing   Problem: Coping: Goal: Ability to adjust to condition or change in health will improve Outcome: Progressing   Problem: Fluid Volume: Goal: Ability to maintain a balanced intake and output will improve Outcome: Progressing   Problem: Metabolic: Goal: Ability to maintain appropriate glucose levels will improve Outcome: Progressing   Problem: Nutritional: Goal: Maintenance of adequate nutrition will improve Outcome: Progressing   Problem: Clinical Measurements: Goal: Respiratory complications will improve Outcome: Progressing Goal: Cardiovascular complication will be avoided Outcome: Progressing   Problem: Skin Integrity: Goal: Risk for impaired skin integrity will decrease Outcome: Not Progressing   "

## 2024-09-28 ENCOUNTER — Encounter: Payer: Self-pay | Admitting: Vascular Surgery

## 2024-09-28 ENCOUNTER — Encounter
Admission: EM | Disposition: A | Discharge status: Home / Self Care | Payer: Self-pay | Source: Ambulatory Visit | Attending: Student in an Organized Health Care Education/Training Program

## 2024-09-28 DIAGNOSIS — R6521 Severe sepsis with septic shock: Secondary | ICD-10-CM | POA: Diagnosis not present

## 2024-09-28 DIAGNOSIS — A419 Sepsis, unspecified organism: Secondary | ICD-10-CM | POA: Diagnosis not present

## 2024-09-28 DIAGNOSIS — L97429 Non-pressure chronic ulcer of left heel and midfoot with unspecified severity: Secondary | ICD-10-CM | POA: Diagnosis not present

## 2024-09-28 LAB — CBC
HCT: 29.2 % — ABNORMAL LOW (ref 39.0–52.0)
Hemoglobin: 9.4 g/dL — ABNORMAL LOW (ref 13.0–17.0)
MCH: 29.9 pg (ref 26.0–34.0)
MCHC: 32.2 g/dL (ref 30.0–36.0)
MCV: 93 fL (ref 80.0–100.0)
Platelets: 221 10*3/uL (ref 150–400)
RBC: 3.14 MIL/uL — ABNORMAL LOW (ref 4.22–5.81)
RDW: 17.8 % — ABNORMAL HIGH (ref 11.5–15.5)
WBC: 6.1 10*3/uL (ref 4.0–10.5)
nRBC: 0 % (ref 0.0–0.2)

## 2024-09-28 LAB — BASIC METABOLIC PANEL WITH GFR
Anion gap: 12 (ref 5–15)
BUN: 7 mg/dL — ABNORMAL LOW (ref 8–23)
CO2: 23 mmol/L (ref 22–32)
Calcium: 8.6 mg/dL — ABNORMAL LOW (ref 8.9–10.3)
Chloride: 102 mmol/L (ref 98–111)
Creatinine, Ser: 0.8 mg/dL (ref 0.61–1.24)
GFR, Estimated: >60 mL/min
Glucose, Bld: 175 mg/dL — ABNORMAL HIGH (ref 70–99)
Potassium: 4.5 mmol/L (ref 3.5–5.1)
Sodium: 137 mmol/L (ref 135–145)

## 2024-09-28 LAB — CORTISOL-AM, BLOOD: Cortisol - AM: 24.4 ug/dL — ABNORMAL HIGH (ref 6.7–22.6)

## 2024-09-28 LAB — GLUCOSE, CAPILLARY
Glucose-Capillary: 112 mg/dL — ABNORMAL HIGH (ref 70–99)
Glucose-Capillary: 130 mg/dL — ABNORMAL HIGH (ref 70–99)
Glucose-Capillary: 132 mg/dL — ABNORMAL HIGH (ref 70–99)
Glucose-Capillary: 141 mg/dL — ABNORMAL HIGH (ref 70–99)
Glucose-Capillary: 175 mg/dL — ABNORMAL HIGH (ref 70–99)

## 2024-09-28 LAB — CK: Total CK: 71 U/L (ref 49–397)

## 2024-09-28 LAB — NOROVIRUS GROUP 1 & 2 BY PCR, STOOL
Norovirus 1 by PCR: NEGATIVE
Norovirus 2  by PCR: NEGATIVE

## 2024-09-28 LAB — PHOSPHORUS: Phosphorus: 2.4 mg/dL — ABNORMAL LOW (ref 2.5–4.6)

## 2024-09-28 LAB — MAGNESIUM: Magnesium: 1.5 mg/dL — ABNORMAL LOW (ref 1.7–2.4)

## 2024-09-28 MED ORDER — MIDAZOLAM HCL 2 MG/2ML IJ SOLN
INTRAMUSCULAR | Status: AC
  Filled 2024-09-28: qty 4

## 2024-09-28 MED ORDER — FENTANYL CITRATE (PF) 100 MCG/2ML IJ SOLN
INTRAMUSCULAR | Status: AC
  Filled 2024-09-28: qty 2

## 2024-09-28 MED ORDER — FENTANYL CITRATE (PF) 100 MCG/2ML IJ SOLN
INTRAMUSCULAR | Status: DC | PRN
  Administered 2024-09-28: 50 ug via INTRAVENOUS

## 2024-09-28 MED ORDER — SODIUM CHLORIDE 0.9 % IV SOLN: INTRAVENOUS | Status: DC

## 2024-09-28 MED ORDER — HEPARIN SODIUM (PORCINE) 1000 UNIT/ML IJ SOLN
INTRAMUSCULAR | Status: AC
  Filled 2024-09-28: qty 10

## 2024-09-28 MED ORDER — CEFAZOLIN SODIUM-DEXTROSE 2-4 GM/100ML-% IV SOLN: 2.0000 g | Freq: Once | INTRAVENOUS | Status: DC

## 2024-09-28 MED ORDER — LIDOCAINE-EPINEPHRINE (PF) 1 %-1:200000 IJ SOLN
INTRAMUSCULAR | Status: DC | PRN
  Administered 2024-09-28: 10 mL

## 2024-09-28 MED ORDER — MIDAZOLAM HCL (PF) 2 MG/2ML IJ SOLN
INTRAMUSCULAR | Status: DC | PRN
  Administered 2024-09-28: 1 mg via INTRAVENOUS

## 2024-09-28 MED ORDER — HEPARIN (PORCINE) IN NACL 1000-0.9 UT/500ML-% IV SOLN
INTRAVENOUS | Status: DC | PRN
  Administered 2024-09-28: 1000 mL

## 2024-09-28 MED ORDER — CEFAZOLIN SODIUM-DEXTROSE 2-4 GM/100ML-% IV SOLN
INTRAVENOUS | Status: AC
  Filled 2024-09-28: qty 100

## 2024-09-28 MED ORDER — IODIXANOL 320 MG/ML IV SOLN
INTRAVENOUS | Status: DC | PRN
  Administered 2024-09-28: 35 mL

## 2024-09-28 MED ORDER — ENOXAPARIN SODIUM 60 MG/0.6ML IJ SOSY
0.5000 mg/kg | PREFILLED_SYRINGE | INTRAMUSCULAR | Status: DC
  Administered 2024-09-28 – 2024-10-02 (×5): 55 mg via SUBCUTANEOUS
  Filled 2024-09-28 (×5): qty 0.6

## 2024-09-28 NOTE — Interval H&P Note (Signed)
 History and Physical Interval Note:  09/28/2024 9:56 AM  [name]  has presented today for surgery, with the diagnosis of ulceration left heel.  The various methods of treatment have been discussed with the patient and family. After consideration of risks, benefits and other options for treatment, the patient has consented to  Procedures: Lower Extremity Angiography (Left) as a surgical intervention.  The patient's history has been reviewed, patient examined, no change in status, stable for surgery.  I have reviewed the patient's chart and labs.  Questions were answered to the patient's satisfaction.     Mirella Gueye

## 2024-09-28 NOTE — Evaluation (Signed)
 Physical Therapy Evaluation Patient Details Name: [name] MRN: [id number] DOB: 1950/10/06 Today's Date: 09/28/2024  History of Present Illness  [name] is a 73yoM who comes to [facility] on 09/21/24 direct from [facility] due to hypoglycemia: CBG in 40s, BP 53/38 mmHg. Pt admitted to ICU in septic shock. PMH: hypotension on midodrine , HTN, HLD, CKD3, metastatic cancer. At baseline pt is minimally mobile, uses a WC, recent return to home from rehab.  Clinical Impression  Met with patient at bedside, pt agreeable to PT evaluation. Pt attempts to demonstrate typical transfer pattern for bed to [facility], essentially unable due to acute weakness. Pt is able to partake in step pivot transfer to [facility] c RW, moves well once in standing, but is remarkably weak which means significant modification required to rise to full upright stance. Pt left on BSC with call-bell, RN aware of need for assistance when ready. Pt reports his sisters can likely assist him with transfers in his weak state, however it seems unlikely they would be able to provide mod to maxA for transfers without a mechanical lift. Pt is rehab appropriate at DC but he has a strong preference for avoiding STR at DC.       If plan is discharge home, recommend the following: A lot of help with bathing/dressing/bathroom;Two people to help with walking and/or transfers;Assist for transportation;Assistance with cooking/housework;Help with stairs or ramp for entrance   Can travel by private vehicle        Equipment Recommendations None recommended by PT  Recommendations for Other Services       Functional Status Assessment Patient has had a recent decline in their functional status and demonstrates the ability to make significant improvements in function in a reasonable and predictable amount of time.     Precautions / Restrictions Precautions Precautions: Fall Restrictions Weight Bearing Restrictions Per Provider Order: No      Mobility   Bed Mobility Overal bed mobility: Needs Assistance Bed Mobility: Supine to Sit     Supine to sit: Mod assist     General bed mobility comments: not an ADL movement pattern for this patient at baseline (sleeps in recliner)    Transfers Overall transfer level: Needs assistance Equipment used: Rolling walker (2 wheels) Transfers: Sit to/from Stand, Bed to chair/wheelchair/BSC Sit to Stand: From elevated surface, Min assist Stand pivot transfers: Contact guard assist         General transfer comment: steps well, once in standing    Ambulation/Gait Ambulation/Gait assistance:  (pt does not AMB at baseline.)                Stairs            Wheelchair Mobility     Tilt Bed    Modified Rankin (Stroke Patients Only)       Balance                                             Pertinent Vitals/Pain Pain Assessment Pain Assessment: No/denies pain    Home Living Family/patient expects to be discharged to:: Private residence Living Arrangements: Other relatives (2 sisters) Available Help at Discharge: Family;Available 24 hours/day Type of Home: House Home Access: Ramped entrance       Home Layout: One level Home Equipment: Grab bars - tub/shower;Shower Counsellor (2 wheels);Wheelchair - manual;Hand held shower head;BSC/3in1;Cane - single point  Additional Comments: sleeps in recliner    Prior Function               Mobility Comments: 6       Extremity/Trunk Assessment                Communication        Cognition Arousal: Alert Behavior During Therapy: WFL for tasks assessed/performed   PT - Cognitive impairments: No apparent impairments                                 Cueing       General Comments      Exercises     Assessment/Plan    PT Assessment Patient needs continued PT services  PT Problem List Decreased strength;Decreased activity tolerance;Decreased balance;Decreased  mobility       PT Treatment Interventions DME instruction;Gait training;Stair training;Functional mobility training;Therapeutic activities;Therapeutic exercise;Balance training;Neuromuscular re-education;Patient/family education    PT Goals (Current goals can be found in the Care Plan section)  Acute Rehab PT Goals Patient Stated Goal: avoid rehab placement at DC PT Goal Formulation: With patient Time For Goal Achievement: 10/12/24 Potential to Achieve Goals: Fair    Frequency Min 2X/week     Co-evaluation               AM-PAC PT 6 Clicks Mobility  Outcome Measure Help needed turning from your back to your side while in a flat bed without using bedrails?: A Lot Help needed moving from lying on your back to sitting on the side of a flat bed without using bedrails?: A Lot Help needed moving to and from a bed to a chair (including a wheelchair)?: A Lot Help needed standing up from a chair using your arms (e.g., wheelchair or bedside chair)?: A Lot Help needed to walk in hospital room?: A Lot Help needed climbing 3-5 steps with a railing? : A Lot 6 Click Score: 12    End of Session   Activity Tolerance: Patient limited by fatigue Patient left: in chair;with call bell/phone within reach Nurse Communication: Mobility status PT Visit Diagnosis: Unsteadiness on feet (R26.81);Other abnormalities of gait and mobility (R26.89);Muscle weakness (generalized) (M62.81)    Time: 8686-8657 PT Time Calculation (min) (ACUTE ONLY): 29 min   Charges:   PT Evaluation $PT Eval Moderate Complexity: 1 Mod PT Treatments $Self Care/Home Management: 8-22 PT General Charges $$ ACUTE PT VISIT: 1 Visit       2:48 PM, 09/28/24 [name], PT, DPT Physical Therapist - [facility]  [phone] (ASCOM)    Azel Gumina C 09/28/2024, 2:44 PM

## 2024-09-28 NOTE — Progress Notes (Signed)
 PT Cancellation Note  Patient Details Name: [name] MRN: [id number] DOB: 09/06/1950   Cancelled Treatment:    Reason Eval/Treat Not Completed: Patient at procedure or test/unavailable (chart reviewed, RN consulted. Unable to evaluate pt as pt is OTF for vascular procedure. Will continue to follow and evaluate at later date/time.)  10:10 AM, 09/28/24 [name], PT, DPT Physical Therapist - [facility]  [phone] (ASCOM)    Coletta Lockner C 09/28/2024, 10:10 AM

## 2024-09-28 NOTE — Progress Notes (Signed)
 " PROGRESS NOTE    [name]  FMW:[id number] DOB: 12-27-1950 DOA: 09/21/2024 PCP: Adina Buel HERO, MD  142A/[id number]-AA  LOS: 7 days   Brief hospital course:   Assessment & Plan: [name] is a 74 year old male with a past medical history significant for hypotension on midodrine , former HTN, HLD, CKD Stage III, bladder incontinence and metastatic prostate cancer who presented to [facility] ED on 1/21 from an outpatient podiatry appointment for evaluation of hypotension.  Upon arriving the hospital, patient was diagnosed with a septic shock, was placed on Levophed  in ICU.  Source of infection was thought to be secondary to left heel ulceration with infection.  Initially treated with cefepime  and vancomycin , changed to Zyvox . Patient was seen by podiatry, MRI of the foot did not show any evidence of osteomyelitis. Patient is doing better, pressor was weaned off on 1/25, transferred to medical floor on 1/26. Patient also has history of peripheral vascular disease, vascular surgery to perform angiogram on 1/28.   Septic shock secondary to left heel wound infection. UTI ruled out. Reviewed chart, patient had a fever while in the hospital, also had hypothermia.  And leukopenia.  he also had acute kidney injury, severe metabolic acidosis with a lactic acid level more than 9.0.  He was also requiring pressor for 3 days.  This is consistent with septic shock, the source of infection thought to be secondary to left heel wound. Blood culture negative, wound culture was not sent out.  Urine culture also negative. --cont midodrine   Left heel wound infection. --cont Linezolid    PAD? --angiogram today neg for focal stenosis  Acute metabolic cephalopathy.   Not toxic encephalopathy Patient had a significant confusion on time admission, condition has so far improved.   Acute kidney injury secondary to septic shock. Chronic kidney disease stage III rule out. --AKI resolved --oral hydration  now  Severe metabolic acidosis with a lactic acidosis. --both resolved  Hypomagnesemia. Hypophosphatemia. Hypokalemia. --monitor and supplement PRN  Hyponatremia, resolved  Recent fall with traumatic rhabdomyolysis. CK level has normalized   Anemia of chronic disease. Patient had a recent iron  study, B12 and folic acid, did not show any deficiency. Anemia appears to be secondary to chronic disease.   --monitor Hgb   Type 2 diabetes. --recent A1c 5.6.  BG's have been mostly within inpatient goals. --d/c BG checks and SSI   Elevated troponin secondary to demand ischemia No need for additional workup.   DVT prophylaxis: Lovenox  SQ Code Status: Full code  Family Communication:  Level of care: Telemetry Dispo:   The patient is from: home Anticipated d/c is to: to be determined Anticipated d/c date is: 1-2 days   Subjective and Interval History:  Pt underwent angiogram today, with no focal stenosis.  No complaint afterwards.   Objective: Vitals:   09/28/24 1045 09/28/24 1100 09/28/24 1134 09/28/24 1646  BP: (!) 102/52 (!) 100/49 (!) 104/53 115/64  Pulse: 80 78 76 85  Resp: 16 17 17 17   Temp:  98.2 F (36.8 C) 98.2 F (36.8 C) 98.2 F (36.8 C)  TempSrc:  Oral    SpO2: 94% 94% 97% 97%  Weight:      Height:        Intake/Output Summary (Last 24 hours) at 09/28/2024 1943 Last data filed at 09/28/2024 1830 Gross per 24 hour  Intake 991.77 ml  Output 1850 ml  Net -858.23 ml   Filed Weights   09/25/24 0500 09/26/24 0456 09/28/24 0500  Weight: 107.2 kg 109.2 kg 109.8 kg    Examination:   Constitutional: NAD, alert, oriented HEENT: conjunctivae and lids normal, EOMI CV: No cyanosis.   RESP: normal respiratory effort, on RA Extremities: right trans-MT resection, left heel covered in dressing SKIN: warm, dry Neuro: II - XII grossly intact.   Psych: Normal mood and affect.  Appropriate judgement and reason   Data Reviewed: I have personally reviewed labs  and imaging studies  Time spent: 50 minutes  Ellouise Haber, MD [facility] If 7PM-7AM, please contact night-coverage 09/28/2024, 7:43 PM   "

## 2024-09-28 NOTE — Op Note (Addendum)
 [facility]  Percutaneous Study/Intervention Procedural Note   Date of Surgery: 09/28/2024  Surgeon(s):Tinaya Ceballos    Assistants:none  Pre-operative Diagnosis: Ulceration left lower extremity  Post-operative diagnosis:  Same  Procedure(s) Performed:             1.  Ultrasound guidance for vascular access right femoral artery             2.  Catheter placement into left SFA from right femoral approach             3.  Aortogram and selective left lower extremity angiogram             4.  StarClose closure device right femoral artery  EBL: 3 cc  Contrast: 35 cc  Fluoro Time: 1 minute  Moderate Conscious Sedation Time: approximately 15 minutes using 1 mg of Versed  and 50 mcg of Fentanyl               Indications:  Patient is a 74 y.o.male with longstanding left heel ulceration which is poorly healing. The patient is brought in for angiography for further evaluation and potential treatment.  Due to the limb threatening nature of the situation, angiogram was performed for attempted limb salvage. The patient is aware that if the procedure fails, amputation would be expected.  The patient also understands that even with successful revascularization, amputation may still be required due to the severity of the situation.  Risks and benefits are discussed and informed consent is obtained.   Procedure:  The patient was identified and appropriate procedural time out was performed.  The patient was then placed supine on the table and prepped and draped in the usual sterile fashion. Moderate conscious sedation was administered during a face to face encounter with the patient throughout the procedure with my supervision of the RN administering medicines and monitoring the patient's vital signs, pulse oximetry, telemetry and mental status throughout from the start of the procedure until the patient was taken to the recovery room. Ultrasound was used to evaluate the right common  femoral artery.  It was patent .  A digital ultrasound image was acquired.  A Seldinger needle was used to access the right common femoral artery under direct ultrasound guidance and a permanent image was performed.  A 0.035 J wire was advanced without resistance and a 5Fr sheath was placed.  Pigtail catheter was placed into the aorta and an AP aortogram was performed. This demonstrated normal renal arteries and normal aorta and iliac segments without significant stenosis. I then crossed the aortic bifurcation and advanced to the left femoral head and then into the mid left SFA to opacify distally for the tibial imaging. Selective left lower extremity angiogram was then performed. This demonstrated normal common femoral artery, superficial femoral artery, and popliteal artery.  There was a typical tibial trifurcation.  All 3 tibial vessels were present and continuous distally without obvious focal stenosis.  The anterior tibial artery was very large and was the dominant runoff but both the posterior tibial and peroneal arteries provided additional flow distally and did not have focal stenosis.  The patient had adequate arterial inflow for wound healing and no intervention was required. I elected to terminate the procedure. The sheath was removed and StarClose closure device was deployed in the right femoral artery with excellent hemostatic result. The patient was taken to the recovery room in stable condition having tolerated the procedure well.  Findings:  Aortogram:  This demonstrated normal renal arteries and normal aorta and iliac segments without significant stenosis.             Left Lower Extremity:  This demonstrated normal common femoral artery, superficial femoral artery, and popliteal artery.  There was a typical tibial trifurcation.  All 3 tibial vessels were present and continuous distally without obvious focal stenosis.  The anterior tibial artery was very large and was the dominant  runoff but both the posterior tibial and peroneal arteries provided additional flow distally and did not have focal stenosis.   Disposition: Patient was taken to the recovery room in stable condition having tolerated the procedure well.  Complications: None  Selinda Gu 09/28/2024 10:16 AM   This note was created with Dragon Medical transcription system. Any errors in dictation are purely unintentional.

## 2024-09-28 NOTE — Plan of Care (Signed)
 " Problem: Education: Goal: Ability to describe self-care measures that may prevent or decrease complications (Diabetes Survival Skills Education) will improve 09/28/2024 0258 by Geralynn Penton, RN Outcome: Progressing 09/28/2024 0258 by Geralynn Penton, RN Outcome: Progressing Goal: Individualized Educational Video(s) 09/28/2024 0258 by Geralynn Penton, RN Outcome: Progressing 09/28/2024 0258 by Geralynn Penton, RN Outcome: Progressing   Problem: Coping: Goal: Ability to adjust to condition or change in health will improve 09/28/2024 0258 by Geralynn Penton, RN Outcome: Progressing 09/28/2024 0258 by Geralynn Penton, RN Outcome: Progressing   Problem: Fluid Volume: Goal: Ability to maintain a balanced intake and output will improve 09/28/2024 0258 by Geralynn Penton, RN Outcome: Progressing 09/28/2024 0258 by Geralynn Penton, RN Outcome: Progressing   Problem: Health Behavior/Discharge Planning: Goal: Ability to identify and utilize available resources and services will improve 09/28/2024 0258 by Geralynn Penton, RN Outcome: Progressing 09/28/2024 0258 by Geralynn Penton, RN Outcome: Progressing Goal: Ability to manage health-related needs will improve 09/28/2024 0258 by Geralynn Penton, RN Outcome: Progressing 09/28/2024 0258 by Geralynn Penton, RN Outcome: Progressing   Problem: Metabolic: Goal: Ability to maintain appropriate glucose levels will improve 09/28/2024 0258 by Geralynn Penton, RN Outcome: Progressing 09/28/2024 0258 by Geralynn Penton, RN Outcome: Progressing   Problem: Nutritional: Goal: Maintenance of adequate nutrition will improve 09/28/2024 0258 by Geralynn Penton, RN Outcome: Progressing 09/28/2024 0258 by Geralynn Penton, RN Outcome: Progressing Goal: Progress toward achieving an optimal weight will improve 09/28/2024 0258 by Geralynn Penton, RN Outcome: Progressing 09/28/2024 0258 by Geralynn Penton, RN Outcome:  Progressing   Problem: Skin Integrity: Goal: Risk for impaired skin integrity will decrease 09/28/2024 0258 by Geralynn Penton, RN Outcome: Progressing 09/28/2024 0258 by Geralynn Penton, RN Outcome: Progressing   Problem: Tissue Perfusion: Goal: Adequacy of tissue perfusion will improve 09/28/2024 0258 by Geralynn Penton, RN Outcome: Progressing 09/28/2024 0258 by Geralynn Penton, RN Outcome: Progressing   Problem: Education: Goal: Knowledge of General Education information will improve Description: Including pain rating scale, medication(s)/side effects and non-pharmacologic comfort measures 09/28/2024 0258 by Geralynn Penton, RN Outcome: Progressing 09/28/2024 0258 by Geralynn Penton, RN Outcome: Progressing   Problem: Health Behavior/Discharge Planning: Goal: Ability to manage health-related needs will improve 09/28/2024 0258 by Geralynn Penton, RN Outcome: Progressing 09/28/2024 0258 by Geralynn Penton, RN Outcome: Progressing   Problem: Clinical Measurements: Goal: Ability to maintain clinical measurements within normal limits will improve 09/28/2024 0258 by Geralynn Penton, RN Outcome: Progressing 09/28/2024 0258 by Geralynn Penton, RN Outcome: Progressing Goal: Will remain free from infection 09/28/2024 0258 by Geralynn Penton, RN Outcome: Progressing 09/28/2024 0258 by Geralynn Penton, RN Outcome: Progressing Goal: Diagnostic test results will improve 09/28/2024 0258 by Geralynn Penton, RN Outcome: Progressing 09/28/2024 0258 by Geralynn Penton, RN Outcome: Progressing Goal: Respiratory complications will improve 09/28/2024 0258 by Geralynn Penton, RN Outcome: Progressing 09/28/2024 0258 by Geralynn Penton, RN Outcome: Progressing Goal: Cardiovascular complication will be avoided 09/28/2024 0258 by Geralynn Penton, RN Outcome: Progressing 09/28/2024 0258 by Geralynn Penton, RN Outcome: Progressing   Problem: Activity: Goal: Risk  for activity intolerance will decrease 09/28/2024 0258 by Geralynn Penton, RN Outcome: Progressing 09/28/2024 0258 by Geralynn Penton, RN Outcome: Progressing   Problem: Nutrition: Goal: Adequate nutrition will be maintained 09/28/2024 0258 by Geralynn Penton, RN Outcome: Progressing 09/28/2024 0258 by Geralynn Penton, RN Outcome: Progressing   Problem: Coping: Goal: Level of anxiety will decrease 09/28/2024 0258 by Geralynn Penton, RN Outcome: Progressing 09/28/2024 0258 by Geralynn Penton, RN Outcome: Progressing   Problem: Elimination: Goal: Will not experience complications related to bowel motility 09/28/2024  [id number] by Geralynn Penton, RN Outcome: Progressing 09/28/2024 0258 by Geralynn Penton, RN Outcome: Progressing Goal: Will not experience complications related to urinary retention 09/28/2024 0258 by Geralynn Penton, RN Outcome: Progressing 09/28/2024 0258 by Geralynn Penton, RN Outcome: Progressing   Problem: Pain Managment: Goal: General experience of comfort will improve and/or be controlled 09/28/2024 0258 by Geralynn Penton, RN Outcome: Progressing 09/28/2024 0258 by Geralynn Penton, RN Outcome: Progressing   Problem: Safety: Goal: Ability to remain free from injury will improve 09/28/2024 0258 by Geralynn Penton, RN Outcome: Progressing 09/28/2024 0258 by Geralynn Penton, RN Outcome: Progressing   Problem: Skin Integrity: Goal: Risk for impaired skin integrity will decrease 09/28/2024 0258 by Geralynn Penton, RN Outcome: Progressing 09/28/2024 0258 by Geralynn Penton, RN Outcome: Progressing   "

## 2024-09-28 NOTE — Evaluation (Signed)
 Occupational Therapy Evaluation Patient Details Name: [name] MRN: [id number] DOB: June 05, 1951 Today's Date: 09/28/2024   History of Present Illness   [name] is a 73yoM who comes to [facility] on 09/21/24 direct from [facility] due to hypoglycemia: CBG in 40s, BP 53/38 mmHg. Pt admitted to ICU in septic shock. PMH: hypotension on midodrine , HTN, HLD, CKD3, metastatic cancer. At baseline pt is minimally mobile, uses a WC, recent return to home from rehab.     Clinical Impressions Patient was seen for OT evaluation this date. Patient finishing up on [facility] with RN and NT present; needing max A x 2 for toiletilng; OT assisted with sit<>stand with cues needed for body mechanics; mod A to come into standing and steadying A with r/w and external support while receiving A for perineal hygiene. Patient lives with sisters who he reports are able to assist some, but likely will not be able to lift patient at current state. OT discussed dc recommendations for rehab given amount of support at home, patient resistant to STR and is very motivated to make progress while in hospital to avoid STR. Prior to hospital admission, patient was able to manage basic ADLs without A, uses w/c for mobility and transfers without A. Patient presents with deficits in gross strength, standing balance/tolerance and overall activity tolerance, affecting safe and optimal ADL completion. Patient is currently requiring mod-max A for ADLs.  Paient would benefit from skilled OT services to address noted impairments and functional limitations (see below for any additional details) in order to maximize safety and independence while minimizing future risk of falls, injury, and readmission.  Anticipate the need for follow up OT services upon acute hospital DC.      If plan is discharge home, recommend the following:   A lot of help with walking and/or transfers;A lot of help with bathing/dressing/bathroom;Assistance with  cooking/housework;Supervision due to cognitive status;Help with stairs or ramp for entrance     Functional Status Assessment   Patient has had a recent decline in their functional status and demonstrates the ability to make significant improvements in function in a reasonable and predictable amount of time.     Equipment Recommendations   BSC/3in1     Recommendations for Other Services         Precautions/Restrictions   Precautions Precautions: Fall Recall of Precautions/Restrictions: Intact Restrictions Weight Bearing Restrictions Per Provider Order: No     Mobility Bed Mobility Overal bed mobility: Needs Assistance Bed Mobility: Sit to Supine       Sit to supine: Mod assist        Transfers Overall transfer level: Needs assistance Equipment used: Rolling walker (2 wheels) Transfers: Bed to chair/wheelchair/BSC Sit to Stand: Mod assist, +2 physical assistance                  Balance Overall balance assessment: Needs assistance Sitting-balance support: Feet supported, No upper extremity supported Sitting balance-Leahy Scale: Good   Postural control: Posterior lean Standing balance support: Reliant on assistive device for balance Standing balance-Leahy Scale: Poor                             ADL either performed or assessed with clinical judgement   ADL Overall ADL's : Needs assistance/impaired  General ADL Comments: anticipate max A due to difficulty reaching B distal LE and unable to stand without steadying A     Vision         Perception         Praxis         Pertinent Vitals/Pain Pain Assessment Pain Assessment: No/denies pain     Extremity/Trunk Assessment Upper Extremity Assessment Upper Extremity Assessment: Generalized weakness           Communication Communication Communication: No apparent difficulties   Cognition Arousal: Alert Behavior  During Therapy: WFL for tasks assessed/performed Cognition: No apparent impairments                               Following commands: Intact       Cueing  General Comments   Cueing Techniques: Verbal cues      Exercises     Shoulder Instructions      Home Living Family/patient expects to be discharged to:: Private residence Living Arrangements: Other relatives Available Help at Discharge: Family;Available 24 hours/day Type of Home: House Home Access: Ramped entrance     Home Layout: One level     Bathroom Shower/Tub: Sponge bathes at baseline   Bathroom Toilet: Standard Bathroom Accessibility: Yes   Home Equipment: Grab bars - tub/shower;Shower Counsellor (2 wheels);Wheelchair - manual;Hand held shower head;BSC/3in1;Cane - single point   Additional Comments: sleeps in recliner      Prior Functioning/Environment Prior Level of Function : Needs assist             Mobility Comments: w/c at baseline ADLs Comments: per chart sister is available to assist with ADLs, he reports he is MOD I in ADL    OT Problem List: Decreased strength;Decreased activity tolerance;Impaired balance (sitting and/or standing)   OT Treatment/Interventions: Self-care/ADL training;Therapeutic exercise;Energy conservation;Therapeutic activities      OT Goals(Current goals can be found in the care plan section)   Acute Rehab OT Goals Patient Stated Goal: to get stronger so I can go home OT Goal Formulation: With patient Time For Goal Achievement: 10/12/24 Potential to Achieve Goals: Good ADL Goals Pt Will Perform Grooming: with modified independence;sitting;standing Pt Will Perform Lower Body Dressing: with supervision;sit to/from stand Pt Will Transfer to Toilet: with supervision;bedside commode;stand pivot transfer Pt Will Perform Toileting - Clothing Manipulation and hygiene: with supervision;sit to/from stand   OT Frequency:  Min 2X/week     Co-evaluation              AM-PAC OT 6 Clicks Daily Activity     Outcome Measure Help from another person eating meals?: None Help from another person taking care of personal grooming?: A Little Help from another person toileting, which includes using toliet, bedpan, or urinal?: A Lot Help from another person bathing (including washing, rinsing, drying)?: A Lot Help from another person to put on and taking off regular upper body clothing?: A Little Help from another person to put on and taking off regular lower body clothing?: A Lot 6 Click Score: 16   End of Session Equipment Utilized During Treatment: Rolling walker (2 wheels) Nurse Communication: Mobility status  Activity Tolerance: Patient limited by fatigue Patient left: in bed;with nursing/sitter in room  OT Visit Diagnosis: Unsteadiness on feet (R26.81);Other abnormalities of gait and mobility (R26.89);Muscle weakness (generalized) (M62.81)                Time: 8595-8573 OT Time  Calculation (min): 22 min Charges:  OT General Charges $OT Visit: 1 Visit OT Evaluation $OT Eval Low Complexity: 1 Low  Rogers Clause, OT/L MSOT, 09/28/2024

## 2024-09-28 NOTE — Progress Notes (Signed)
 PHARMACIST - PHYSICIAN COMMUNICATION  CONCERNING:  Enoxaparin  (Lovenox ) for DVT Prophylaxis   ASSESSMENT: Patient was prescribed enoxaparin  40 mg subcutaneously every 24 hours for VTE prophylaxis.   Body mass index is 33.76 kg/m.  Estimated Creatinine Clearance: 102.1 mL/min (by C-G formula based on SCr of 0.8 mg/dL).  Based on [facility] policy, patient qualifies for enoxaparin  dosing of 0.5 mg per kilogram of total body weight every 24 hours because their body mass index is >30 kg/m2.  PLAN: Pharmacy has adjusted enoxaparin  dose per [facility] policy.  Description: Patient is now receiving enoxaparin  0.5 mg/kg subcutaneously every 24 hours.  Will M. Lenon, PharmD, BCPS Clinical Pharmacist 09/28/2024 7:53 PM

## 2024-09-29 DIAGNOSIS — A419 Sepsis, unspecified organism: Secondary | ICD-10-CM | POA: Diagnosis not present

## 2024-09-29 DIAGNOSIS — R6521 Severe sepsis with septic shock: Secondary | ICD-10-CM | POA: Diagnosis not present

## 2024-09-29 LAB — CK: Total CK: 53 U/L (ref 49–397)

## 2024-09-29 MED ORDER — POTASSIUM & SODIUM PHOSPHATES 280-160-250 MG PO PACK
1.0000 | PACK | Freq: Three times a day (TID) | ORAL | Status: AC
  Administered 2024-09-29 – 2024-10-01 (×9): 1 via ORAL
  Filled 2024-09-29 (×9): qty 1

## 2024-09-29 MED ORDER — MAGNESIUM SULFATE 2 GM/50ML IV SOLN
2.0000 g | Freq: Once | INTRAVENOUS | Status: AC
  Administered 2024-09-29: 2 g via INTRAVENOUS
  Filled 2024-09-29: qty 50

## 2024-09-29 NOTE — Progress Notes (Signed)
 Occupational Therapy Treatment Patient Details Name: [name] MRN: [id number] DOB: 03-27-1951 Today's Date: 09/29/2024   History of present illness [name] is a 73yoM who comes to [facility] on 09/21/24 direct from [facility] due to hypoglycemia: CBG in 40s, BP 53/38 mmHg. Pt admitted to ICU in septic shock. PMH: hypotension on midodrine , HTN, HLD, CKD3, metastatic cancer. At baseline pt is minimally mobile, uses a WC, recent return to home from rehab.   OT comments  Patient seen for OT treatment on this date. Upon arrival to room patient resting in bed, agreeable to treatment. Patient reports feeling better today than yesterday, demonstrated bed mobility with min A (does not sleep in bed at home). Unable to don socks, requires max A for LB dressing; performed SPT from EOB to recliner with min/mod A for lifting A, CGA once in stance and cues for body mechanics when transitioning back to seated. Patient showed great improvement today, OT changed dc recs and patient very motivated to return home with A from his sisters.  Patient ended treatment in recliner with bed/chair alarm on and all needs within reach. Patient making good progress toward goals, will continue to follow POC. Discharge recommendation updated, team notified via secure chat.        If plan is discharge home, recommend the following:  A little help with walking and/or transfers;A lot of help with bathing/dressing/bathroom;Assistance with cooking/housework   Equipment Recommendations  None recommended by OT    Recommendations for Other Services      Precautions / Restrictions Precautions Precautions: Fall Recall of Precautions/Restrictions: Intact Restrictions Weight Bearing Restrictions Per Provider Order: No       Mobility Bed Mobility Overal bed mobility: Needs Assistance Bed Mobility: Supine to Sit     Supine to sit: Mod assist          Transfers Overall transfer level: Needs assistance Equipment used:  Rolling walker (2 wheels) Transfers: Bed to chair/wheelchair/BSC Sit to Stand: Min assist, Max assist Stand pivot transfers: Contact guard assist               Balance Overall balance assessment: Needs assistance Sitting-balance support: Feet supported, No upper extremity supported Sitting balance-Leahy Scale: Good   Postural control: Posterior lean Standing balance support: Reliant on assistive device for balance Standing balance-Leahy Scale: Fair                             ADL either performed or assessed with clinical judgement   ADL Overall ADL's : Needs assistance/impaired                     Lower Body Dressing: Maximal assistance;Sit to/from stand Lower Body Dressing Details (indicate cue type and reason): unable to reach B distal LE for LB dressing             Functional mobility during ADLs: Minimal assistance;Moderate assistance;Rolling walker (2 wheels)      Extremity/Trunk Assessment              Vision       Perception     Praxis     Communication Communication Communication: No apparent difficulties   Cognition Arousal: Alert Behavior During Therapy: WFL for tasks assessed/performed                                 Following commands: Intact  Cueing   Cueing Techniques: Verbal cues  Exercises      Shoulder Instructions       General Comments      Pertinent Vitals/ Pain       Pain Assessment Pain Assessment: No/denies pain  Home Living                                          Prior Functioning/Environment              Frequency  Min 3X/week        Progress Toward Goals  OT Goals(current goals can now be found in the care plan section)  Progress towards OT goals: Progressing toward goals  Acute Rehab OT Goals Patient Stated Goal: to go home OT Goal Formulation: With patient Time For Goal Achievement: 10/12/24 Potential to Achieve Goals: Good ADL  Goals Pt Will Perform Grooming: with modified independence;sitting;standing Pt Will Perform Lower Body Dressing: with supervision;sit to/from stand Pt Will Transfer to Toilet: with supervision;bedside commode;stand pivot transfer Pt Will Perform Toileting - Clothing Manipulation and hygiene: with supervision;sit to/from stand  Plan      Co-evaluation                 AM-PAC OT 6 Clicks Daily Activity     Outcome Measure   Help from another person eating meals?: None Help from another person taking care of personal grooming?: A Little Help from another person toileting, which includes using toliet, bedpan, or urinal?: A Lot Help from another person bathing (including washing, rinsing, drying)?: A Lot Help from another person to put on and taking off regular upper body clothing?: A Little Help from another person to put on and taking off regular lower body clothing?: A Lot 6 Click Score: 16    End of Session Equipment Utilized During Treatment: Rolling walker (2 wheels)  OT Visit Diagnosis: Unsteadiness on feet (R26.81);Other abnormalities of gait and mobility (R26.89);Muscle weakness (generalized) (M62.81)   Activity Tolerance Patient tolerated treatment well   Patient Left in chair;with call bell/phone within reach;with chair alarm set   Nurse Communication Mobility status        Time: 1021-1040 OT Time Calculation (min): 19 min  Charges: OT General Charges $OT Visit: 1 Visit OT Treatments $Self Care/Home Management : 8-22 mins  Rogers Clause, OT/L MSOT, 09/29/2024

## 2024-09-29 NOTE — Plan of Care (Signed)
" °  Problem: Coping: Goal: Ability to adjust to condition or change in health will improve Outcome: Progressing   Problem: Fluid Volume: Goal: Ability to maintain a balanced intake and output will improve Outcome: Progressing   Problem: Health Behavior/Discharge Planning: Goal: Ability to identify and utilize available resources and services will improve Outcome: Progressing Goal: Ability to manage health-related needs will improve Outcome: Progressing   Problem: Metabolic: Goal: Ability to maintain appropriate glucose levels will improve Outcome: Progressing   Problem: Nutritional: Goal: Maintenance of adequate nutrition will improve Outcome: Progressing Goal: Progress toward achieving an optimal weight will improve Outcome: Progressing   Problem: Skin Integrity: Goal: Risk for impaired skin integrity will decrease Outcome: Progressing   Problem: Tissue Perfusion: Goal: Adequacy of tissue perfusion will improve Outcome: Progressing   Problem: Health Behavior/Discharge Planning: Goal: Ability to manage health-related needs will improve Outcome: Progressing   Problem: Clinical Measurements: Goal: Ability to maintain clinical measurements within normal limits will improve Outcome: Progressing Goal: Will remain Xavius Spadafore from infection Outcome: Progressing Goal: Diagnostic test results will improve Outcome: Progressing Goal: Respiratory complications will improve Outcome: Progressing Goal: Cardiovascular complication will be avoided Outcome: Progressing   Problem: Activity: Goal: Risk for activity intolerance will decrease Outcome: Progressing   Problem: Nutrition: Goal: Adequate nutrition will be maintained Outcome: Progressing   Problem: Coping: Goal: Level of anxiety will decrease Outcome: Progressing   Problem: Elimination: Goal: Will not experience complications related to bowel motility Outcome: Progressing Goal: Will not experience complications related to  urinary retention Outcome: Progressing   Problem: Pain Managment: Goal: General experience of comfort will improve and/or be controlled Outcome: Progressing   Problem: Safety: Goal: Ability to remain [place] from injury will improve Outcome: Progressing   Problem: Skin Integrity: Goal: Risk for impaired skin integrity will decrease Outcome: Progressing   Problem: Education: Goal: Ability to describe self-care measures that may prevent or decrease complications (Diabetes Survival Skills Education) will improve Outcome: Not Progressing Note: Needs reinforcement. Goal: Individualized Educational Video(s) Outcome: Not Progressing Note: Needs reinforcement.   "

## 2024-09-29 NOTE — Progress Notes (Signed)
 Nutrition Follow-up  DOCUMENTATION CODES:   Obesity unspecified  INTERVENTION:   -Continue dysphagia 3 diet for ease of intake -Continue Ensure Plus High Protein po BID, each supplement provides 350 kcal and 20 grams of protein  -Continue Mighty Shake TID with meals, each supplement provides 330 kcals and 9 grams of protein -Continue Magic cup TID with meals, each supplement provides 290 kcal and 9 grams of protein -Continue MVI po daily  -Continue Vitamin C  500mg  po BID -Continue Zinc  220mg  po daily x 30 days   NUTRITION DIAGNOSIS:   Inadequate oral intake related to acute illness as evidenced by per patient/family report.  Ongoing  GOAL:   Patient will meet greater than or equal to 90% of their needs  Progressing   MONITOR:   PO intake, Supplement acceptance, Labs, Weight trends, Skin, I & O's  REASON FOR ASSESSMENT:   Malnutrition Screening Tool    ASSESSMENT:   74 year old male with a past medical history significant for hypotension on midodrine , former HTN, HLD, IDA, PVD, DVT, CKD Stage III, bladder incontinence and stage IV metastatic prostate cancer who is admitted with shock and AKI.  1/28- s/p Aortogram and selective left lower extremity angiogram   Reviewed I/O's: -1.7 L x 24 hours and -5.2 L since admission  UOP: 1.9 L x 24 hours  Per podiatry notes, not plans for surgical interventions at this time.   Per [facility] notes, patient wit multiple areas of skin breakdown: fissure on right gluteal fold (partial thickness); stage 3 pressure injury to left ischium/ buttocks; stage 2 pressure injury partial thickness (MASD and friction) to right thigh; stage 2 pressure injury right buttocks (partial thickness); DTPI to right trochanter with partial thickness skin breakdown; partial thickness skin breakdown to right back, and DM ulcer to left foot.   Patient sleeping soundly at time of visit, but arose very easily to voice and touch. Patient reports he is feeling  okay today. He reports he has a good appetite and is hungry for breakfast (Frosted Flakes). He has been eating most of his food (documented meal completions 50-75%). He appreciates the dysphagia 3 diet, due to multiple missing teeth and poor dentition.   Reviewed weight history. Weights have ranged from 103.7-114 kg over the past 7 days.   He is very appreciative of the care he has been receiving and feels happy about his angiogram results (it was all clear). Reviewed current nutrition care plan, which patient is agreeable to continue. Discussed importance of good meal and supplement intake to promote healing.   Medications reviewed and include potassium and sodium phosphates , senokot, thiamine , and zinc  sulfate.   Labs reviewed: CBGS: 112-200 (inpatient orders for glycemic control are none). Vitamin B-12 WDL.   Diet Order:   Diet Order             DIET DYS 3 Room service appropriate? Yes; Fluid consistency: Thin  Diet effective now                   EDUCATION NEEDS:   Education needs have been addressed  Skin:  Skin Assessment: Skin Integrity Issues: Skin Integrity Issues:: Other (Comment), Stage III, Stage II, DTI, Diabetic Ulcer DTI: right trochanter with partial thickness skin breakdown Stage II: right buttock (partial thickness) Stage III: left ischium/ buttocks Diabetic Ulcer: left foot Other: fissure on gluteal fold; partial thickness of right back; MASD and friction to right thigh  Last BM:  09/29/24 (type 5)  Height:   Ht  Readings from Last 1 Encounters:  09/21/24 5' 11 (1.803 m)    Weight:   Wt Readings from Last 1 Encounters:  09/29/24 114 kg    Ideal Body Weight:  78 kg  BMI:  Body mass index is 35.05 kg/m.  Estimated Nutritional Needs:   Kcal:  2200-2500kcal/day  Protein:  110-125g/day  Fluid:  2.0-2.3L/day    Margery ORN, RD, LDN, CDCES Registered Dietitian III Certified Diabetes Care and Education Specialist If unable to reach this  RD, please use RD Inpatient group chat on secure chat between hours of 8am-4 pm daily

## 2024-09-29 NOTE — Progress Notes (Signed)
 Mobility Specialist - Progress Note   09/29/24 1438  Mobility  Activity Pivoted/transferred to/from BSC;Stood with assistance  Level of Assistance Moderate assist, patient does 50-74%  Assistive Device Front wheel walker  Distance Ambulated (ft) 4 ft  Activity Response Tolerated well  Mobility visit 1 Mobility  Mobility Specialist Start Time (ACUTE ONLY) 1419  Mobility Specialist Stop Time (ACUTE ONLY) 1437  Mobility Specialist Time Calculation (min) (ACUTE ONLY) 18 min   Pt seated in the recliner upon entry, utilizing RA. He required ModA to stand and transfer to/from the [facility]-- MaxA peri care. Pt returned to the recliner, left seated with alarm set and needs within reach.  America Silvan Mobility Specialist 09/29/24 2:40 PM

## 2024-09-29 NOTE — Progress Notes (Signed)
 " PROGRESS NOTE    [name]  FMW:[id number] DOB: 1951-02-04 DOA: 09/21/2024 PCP: Adina Buel HERO, MD  [id number]  LOS: 8 days   Brief hospital course:   Assessment & Plan: [name] is a 74 year old male with a past medical history significant for hypotension on midodrine , former HTN, HLD, CKD Stage III, bladder incontinence and metastatic prostate cancer who presented to [facility] ED on 1/21 from an outpatient podiatry appointment for evaluation of hypotension.  Upon arriving the hospital, patient was diagnosed with a septic shock, was placed on Levophed  in ICU.  Source of infection was thought to be secondary to left heel ulceration with infection.  Initially treated with cefepime  and vancomycin , changed to Zyvox . Patient was seen by podiatry, MRI of the foot did not show any evidence of osteomyelitis. Patient is doing better, pressor was weaned off on 1/25, transferred to medical floor on 1/26. Patient also has history of peripheral vascular disease, vascular surgery to perform angiogram on 1/28.   Septic shock secondary to left heel wound infection. UTI ruled out. Reviewed chart, patient had a fever while in the hospital, also had hypothermia.  And leukopenia.  he also had acute kidney injury, severe metabolic acidosis with a lactic acid level more than 9.0.  He was also requiring pressor for 3 days.  This is consistent with septic shock, the source of infection thought to be secondary to left heel wound. Blood culture negative, wound culture was not sent out.  Urine culture also negative. --cont midodrine   Left heel wound --d/c linezolid  today --wound care per order --outpt f/u with podiatry   PAD? --angiogram neg for focal stenosis  Acute metabolic cephalopathy.   Not toxic encephalopathy Patient had a significant confusion on time admission, condition has so far improved.   Acute kidney injury secondary to septic shock. Chronic kidney disease stage III rule  out. --AKI resolved --oral hydration now  Severe metabolic acidosis with a lactic acidosis. --both resolved  Hypomagnesemia. Hypophosphatemia. Hypokalemia. --monitor and supplement PRN  Hyponatremia, resolved  Recent fall with traumatic rhabdomyolysis. CK level has normalized   Anemia of chronic disease. Patient had a recent iron  study, B12 and folic acid, did not show any deficiency. Anemia appears to be secondary to chronic disease.   --monitor Hgb   Type 2 diabetes. --recent A1c 5.6.  BG's have been mostly within inpatient goals. --d/c'ed BG checks and SSI   Elevated troponin secondary to demand ischemia No need for additional workup.   DVT prophylaxis: Lovenox  SQ Code Status: Full code  Family Communication:  Level of care: Telemetry Dispo:   The patient is from: home Anticipated d/c is to: home Anticipated d/c date is: 1-2 days   Subjective and Interval History:  Pt did better with OT today.  Rec changed to home with [facility].  However, pt said he didn't feel ready to go home yet.   Objective: Vitals:   09/29/24 0835 09/29/24 1247 09/29/24 1331 09/29/24 1452  BP: (!) 101/52 (!) 106/55 (!) 109/57 121/66  Pulse: 70 76 69 72  Resp: 16 18 16 16   Temp: 98 F (36.7 C) 97.9 F (36.6 C) (!) 97.5 F (36.4 C) 97.9 F (36.6 C)  TempSrc:      SpO2: 99% 100% 95% 100%  Weight:      Height:        Intake/Output Summary (Last 24 hours) at 09/29/2024 2034 Last data filed at 09/29/2024 1852 Gross per 24 hour  Intake 900  ml  Output --  Net 900 ml   Filed Weights   09/26/24 0456 09/28/24 0500 09/29/24 0307  Weight: 109.2 kg 109.8 kg 114 kg    Examination:   Constitutional: NAD, alert, oriented HEENT: conjunctivae and lids normal, EOMI CV: No cyanosis.   RESP: normal respiratory effort Neuro: II - XII grossly intact.   Psych: Normal mood and affect.  Appropriate judgement and reason   Data Reviewed: I have personally reviewed labs and imaging studies  Time  spent: 35 minutes  Ellouise Haber, MD [facility] If 7PM-7AM, please contact night-coverage 09/29/2024, 8:34 PM   "

## 2024-09-29 NOTE — Plan of Care (Signed)
  Problem: Education: Goal: Ability to describe self-care measures that may prevent or decrease complications (Diabetes Survival Skills Education) will improve Outcome: Progressing   Problem: Coping: Goal: Ability to adjust to condition or change in health will improve Outcome: Progressing   Problem: Coping: Goal: Level of anxiety will decrease Outcome: Progressing

## 2024-09-30 LAB — MAGNESIUM: Magnesium: 1.7 mg/dL (ref 1.7–2.4)

## 2024-09-30 LAB — CK: Total CK: 38 U/L — ABNORMAL LOW (ref 49–397)

## 2024-09-30 LAB — PHOSPHORUS: Phosphorus: 2.8 mg/dL (ref 2.5–4.6)

## 2024-09-30 MED ORDER — MIDODRINE HCL 5 MG PO TABS
5.0000 mg | ORAL_TABLET | Freq: Three times a day (TID) | ORAL | Status: DC
  Administered 2024-10-01 – 2024-10-03 (×8): 5 mg via ORAL
  Filled 2024-09-30 (×8): qty 1

## 2024-09-30 NOTE — Plan of Care (Signed)
 " Problem: Education: Goal: Ability to describe self-care measures that may prevent or decrease complications (Diabetes Survival Skills Education) will improve 09/30/2024 0346 by Geralynn Penton, RN Outcome: Progressing 09/30/2024 0346 by Geralynn Penton, RN Outcome: Progressing Goal: Individualized Educational Video(s) 09/30/2024 0346 by Geralynn Penton, RN Outcome: Progressing 09/30/2024 0346 by Geralynn Penton, RN Outcome: Progressing   Problem: Coping: Goal: Ability to adjust to condition or change in health will improve 09/30/2024 0346 by Geralynn Penton, RN Outcome: Progressing 09/30/2024 0346 by Geralynn Penton, RN Outcome: Progressing   Problem: Fluid Volume: Goal: Ability to maintain a balanced intake and output will improve 09/30/2024 0346 by Geralynn Penton, RN Outcome: Progressing 09/30/2024 0346 by Geralynn Penton, RN Outcome: Progressing   Problem: Health Behavior/Discharge Planning: Goal: Ability to identify and utilize available resources and services will improve 09/30/2024 0346 by Geralynn Penton, RN Outcome: Progressing 09/30/2024 0346 by Geralynn Penton, RN Outcome: Progressing Goal: Ability to manage health-related needs will improve 09/30/2024 0346 by Geralynn Penton, RN Outcome: Progressing 09/30/2024 0346 by Geralynn Penton, RN Outcome: Progressing   Problem: Metabolic: Goal: Ability to maintain appropriate glucose levels will improve 09/30/2024 0346 by Geralynn Penton, RN Outcome: Progressing 09/30/2024 0346 by Geralynn Penton, RN Outcome: Progressing   Problem: Nutritional: Goal: Maintenance of adequate nutrition will improve 09/30/2024 0346 by Geralynn Penton, RN Outcome: Progressing 09/30/2024 0346 by Geralynn Penton, RN Outcome: Progressing Goal: Progress toward achieving an optimal weight will improve 09/30/2024 0346 by Geralynn Penton, RN Outcome: Progressing 09/30/2024 0346 by Geralynn Penton, RN Outcome:  Progressing   Problem: Skin Integrity: Goal: Risk for impaired skin integrity will decrease 09/30/2024 0346 by Geralynn Penton, RN Outcome: Progressing 09/30/2024 0346 by Geralynn Penton, RN Outcome: Progressing   Problem: Tissue Perfusion: Goal: Adequacy of tissue perfusion will improve 09/30/2024 0346 by Geralynn Penton, RN Outcome: Progressing 09/30/2024 0346 by Geralynn Penton, RN Outcome: Progressing   Problem: Education: Goal: Knowledge of General Education information will improve Description: Including pain rating scale, medication(s)/side effects and non-pharmacologic comfort measures 09/30/2024 0346 by Geralynn Penton, RN Outcome: Progressing 09/30/2024 0346 by Geralynn Penton, RN Outcome: Progressing   Problem: Health Behavior/Discharge Planning: Goal: Ability to manage health-related needs will improve 09/30/2024 0346 by Geralynn Penton, RN Outcome: Progressing 09/30/2024 0346 by Geralynn Penton, RN Outcome: Progressing   Problem: Clinical Measurements: Goal: Ability to maintain clinical measurements within normal limits will improve 09/30/2024 0346 by Geralynn Penton, RN Outcome: Progressing 09/30/2024 0346 by Geralynn Penton, RN Outcome: Progressing Goal: Will remain free from infection 09/30/2024 0346 by Geralynn Penton, RN Outcome: Progressing 09/30/2024 0346 by Geralynn Penton, RN Outcome: Progressing Goal: Diagnostic test results will improve 09/30/2024 0346 by Geralynn Penton, RN Outcome: Progressing 09/30/2024 0346 by Geralynn Penton, RN Outcome: Progressing Goal: Respiratory complications will improve 09/30/2024 0346 by Geralynn Penton, RN Outcome: Progressing 09/30/2024 0346 by Geralynn Penton, RN Outcome: Progressing Goal: Cardiovascular complication will be avoided 09/30/2024 0346 by Geralynn Penton, RN Outcome: Progressing 09/30/2024 0346 by Geralynn Penton, RN Outcome: Progressing   Problem: Activity: Goal: Risk  for activity intolerance will decrease 09/30/2024 0346 by Geralynn Penton, RN Outcome: Progressing 09/30/2024 0346 by Geralynn Penton, RN Outcome: Progressing   Problem: Nutrition: Goal: Adequate nutrition will be maintained 09/30/2024 0346 by Geralynn Penton, RN Outcome: Progressing 09/30/2024 0346 by Geralynn Penton, RN Outcome: Progressing   Problem: Coping: Goal: Level of anxiety will decrease 09/30/2024 0346 by Geralynn Penton, RN Outcome: Progressing 09/30/2024 0346 by Geralynn Penton, RN Outcome: Progressing   Problem: Elimination: Goal: Will not experience complications related to bowel motility 09/30/2024  9653 by Geralynn Penton, RN Outcome: Progressing 09/30/2024 0346 by Geralynn Penton, RN Outcome: Progressing Goal: Will not experience complications related to urinary retention 09/30/2024 0346 by Geralynn Penton, RN Outcome: Progressing 09/30/2024 0346 by Geralynn Penton, RN Outcome: Progressing   Problem: Pain Managment: Goal: General experience of comfort will improve and/or be controlled 09/30/2024 0346 by Geralynn Penton, RN Outcome: Progressing 09/30/2024 0346 by Geralynn Penton, RN Outcome: Progressing   Problem: Safety: Goal: Ability to remain free from injury will improve 09/30/2024 0346 by Geralynn Penton, RN Outcome: Progressing 09/30/2024 0346 by Geralynn Penton, RN Outcome: Progressing   Problem: Skin Integrity: Goal: Risk for impaired skin integrity will decrease 09/30/2024 0346 by Geralynn Penton, RN Outcome: Progressing 09/30/2024 0346 by Geralynn Penton, RN Outcome: Progressing   "

## 2024-09-30 NOTE — NC FL2 (Signed)
 " [place]  MEDICAID FL2 LEVEL OF CARE FORM     IDENTIFICATION  Patient Name: [name] Birthdate: 1950-11-21 Sex: male Admission Date (Current Location): 09/21/2024  [facility] [place] Number:  Chiropodist and Address:         Provider Number: [id number]  Attending Physician Name and Address:  Awanda City, MD  Relative Name and Phone Number:       Current Level of Care: Hospital Recommended Level of Care: Skilled Nursing Facility Prior Approval Number:    Date Approved/Denied:   PASRR Number: [id number]  Discharge Plan: SNF    Current Diagnoses: Patient Active Problem List   Diagnosis Date Noted   Hypomagnesemia 09/27/2024   Pressure injury of skin 09/27/2024   Hypophosphatemia 09/27/2024   Traumatic rhabdomyolysis 09/27/2024   Drug-induced neutropenia 08/12/2024   Neutropenic fever 08/07/2024   Pressure injury of buttock, stage 2 (HCC) 07/26/2024   Leukopenia 07/25/2024   Hypoglycemia 07/24/2024   Decubitus ulcer of left heel, unstageable (HCC) 07/24/2024   Hypotension 07/24/2024   Anemia of chronic disease 07/24/2024   Obesity, Class III, BMI 40-49.9 (morbid obesity) (HCC) 07/24/2024   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) 04/20/2024   Low serum vitamin B12 04/19/2024   Hip fracture (HCC) 04/17/2024   Pathological fracture 04/17/2024   Secondary hypercoagulable state 04/17/2024   Hematoma 04/17/2024   Acute blood loss anemia 04/17/2024   Open wound of right foot 04/17/2024   CKD stage 3a, GFR 45-59 ml/min (HCC) 04/17/2024   Encounter for screening colonoscopy 08/20/2023   Polyp of transverse colon 08/20/2023   Polyp of ascending colon 08/20/2023   Diabetic infection of right foot (HCC) 02/19/2023   Atherosclerosis of native arteries of the extremities with ulceration (HCC) 07/14/2022   Diabetic foot infection (HCC) 01/03/2022   Hyperkalemia 01/03/2022   PAD (peripheral artery disease)    HTN (hypertension)    Type II  diabetes mellitus with renal manifestations (HCC)    AKI (acute kidney injury)    Iron  deficiency anemia    Diabetes 1.5, 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   Lymphedema 01/11/2018   Chronic venous insufficiency 01/11/2018   Infected ulcer of skin (HCC) 09/28/2017   Heel ulcer (HCC) 10/30/2016   DM (diabetes mellitus), type 2 (HCC) 10/30/2016   Hyperlipidemia, unspecified 10/30/2016   Chronic kidney disease (CKD) stage G1/A3, glomerular filtration rate (GFR) equal to or greater than 90 mL/min/1.73 square meter and albuminuria creatinine ratio greater than 300 mg/g 10/27/2016   Pressure injury 10/04/2016   Septic shock (HCC) 10/03/2016   Erectile dysfunction 07/20/2014   Obesity 12/18/2010   Hypertension 02/19/2007    Orientation RESPIRATION BLADDER Height & Weight     Self, Time, Situation, Place  Normal Continent Weight: 252 lb 3.2 oz (114.4 kg) Height:  5' 11 (180.3 cm)  BEHAVIORAL SYMPTOMS/MOOD NEUROLOGICAL BOWEL NUTRITION STATUS      Continent Diet (Regular)  AMBULATORY STATUS COMMUNICATION OF NEEDS Skin   Limited Assist Verbally Skin abrasions (Bruising (Pressure injury right and left Buttock, Diabetic Ulcer Right Heel, Pretibial Distal Right anterior))                       Personal Care Assistance Level of Assistance  Bathing, Feeding, Dressing Bathing Assistance: Limited assistance Feeding assistance: Independent Dressing Assistance: Limited assistance     Functional Limitations Info  Sight, Hearing, Speech Sight Info: Adequate Hearing Info: Adequate Speech Info: Adequate    SPECIAL CARE FACTORS FREQUENCY  PT (By licensed PT), OT (By licensed OT)     PT Frequency: 5x/week OT Frequency: 5x/week            Contractures      Additional Factors Info  Allergies, Code Status Code Status Info: Full Allergies Info: Sulfa Antibiotics, Piperacillin -tazobactam In Dex           Current Medications (09/30/2024):  This is the current hospital active  medication list Current Facility-Administered Medications  Medication Dose Route Frequency Provider Last Rate Last Admin   ascorbic acid  (VITAMIN C ) tablet 500 mg  500 mg Oral BID Dew, Jason S, MD   500 mg at 09/30/24 0815   Chlorhexidine  Gluconate Cloth 2 % PADS 6 each  6 each Topical Daily Dew, Jason S, MD   6 each at 09/29/24 2153   enoxaparin  (LOVENOX ) injection 55 mg  0.5 mg/kg Subcutaneous Q24H Awanda City, MD   55 mg at 09/29/24 2149   feeding supplement (ENSURE PLUS HIGH PROTEIN) liquid 237 mL  237 mL Oral BID BM Dew, Jason S, MD   237 mL at 09/30/24 9182   hydrocerin (EUCERIN) cream   Topical BID Dew, Jason S, MD   Given at 09/29/24 2152   midodrine  (PROAMATINE ) tablet 10 mg  10 mg Oral TID WC Dew, Jason S, MD   10 mg at 09/30/24 0815   multivitamin with minerals tablet 1 tablet  1 tablet Oral Daily Marea Selinda RAMAN, MD   1 tablet at 09/30/24 0815   Oral care mouth rinse  15 mL Mouth Rinse PRN Marea Selinda RAMAN, MD       Oral care mouth rinse  15 mL Mouth Rinse 4 times per day Dew, Jason S, MD   15 mL at 09/30/24 9182   Oral care mouth rinse  15 mL Mouth Rinse PRN Dew, Jason S, MD       polyethylene glycol (MIRALAX  / GLYCOLAX ) packet 17 g  17 g Oral Daily PRN Dew, Jason S, MD   17 g at 09/27/24 1803   potassium & sodium phosphates  (PHOS-NAK) 280-160-250 MG packet 1 packet  1 packet Oral TID Awanda City, MD   1 packet at 09/30/24 0816   senna (SENOKOT) tablet 8.6 mg  1 tablet Oral BID PRN Dew, Jason S, MD       senna-docusate (Senokot-S) tablet 2 tablet  2 tablet Oral BID Dew, Jason S, MD   2 tablet at 09/29/24 2150   zinc  sulfate (50mg  elemental zinc ) capsule 220 mg  220 mg Oral Daily Dew, Jason S, MD   220 mg at 09/30/24 9183     Discharge Medications: Please see discharge summary for a list of discharge medications.  Relevant Imaging Results:  Relevant Lab Results:   Additional Information SSN: [id number]  Alvaro Louder, LCSW     "

## 2024-09-30 NOTE — TOC Progression Note (Signed)
 Transition of Care [facility]) - Progression Note    Patient Details  Name: [name] MRN: [id number] Date of Birth: 07-Nov-1950  [facility] CM/SW Contact  Maxcine Strong  [place], [place] Phone Number: 09/30/2024, 2:57 PM  Clinical Narrative:   LCSWA met patient at the bedside Patient indicated tha he was open to go to SNF for rehab. Due to his previous stay of 20 days at [facility] the patient is in his co-pay days. He cannot pay the 2 weeks upfront ($2,000-$3,000) so he will have to go home with [facility].   LCSWA arranged HH with company [facility]. Patient is agreeable.   TOC to follow for discharge                       Expected Discharge Plan and Services                                               Social Drivers of Health (SDOH) Interventions SDOH Screenings   Food Insecurity: No Food Insecurity (09/22/2024)  Housing: High Risk (09/22/2024)  Transportation Needs: No Transportation Needs (09/22/2024)  Utilities: Not At Risk (09/22/2024)  Depression (PHQ2-9): Low Risk (09/13/2024)  Financial Resource Strain: Low Risk  (08/28/2023)   Received from [facility]  Recent Concern: Financial Resource Strain - Medium Risk (08/06/2023)   Received from [facility]  Physical Activity: Inactive (08/28/2023)   Received from [facility]  Social Connections: Unknown (09/22/2024)  Stress: No Stress Concern Present (08/28/2023)   Received from [facility]  Tobacco Use: Medium Risk (09/21/2024)  Health Literacy: Adequate Health Literacy (08/28/2023)   [facility]    Readmission Risk Interventions    09/24/2024   10:11 AM  Readmission Risk Prevention Plan  Transportation Screening Complete  Medication Review (RN Care Manager) Complete  PCP or Specialist appointment within 3-5 days of discharge Complete  HRI or Home Care Consult Complete  SW Recovery  Care/Counseling Consult Complete  Palliative Care Screening Not Applicable  Skilled Nursing Facility Complete

## 2024-09-30 NOTE — Plan of Care (Signed)

## 2024-09-30 NOTE — Progress Notes (Signed)
 Physical Therapy Treatment Patient Details Name: [name] MRN: [id number] DOB: 1950/10/03 Today's Date: 09/30/2024   History of Present Illness [name] is a 73yoM who comes to [facility] on 09/21/24 direct from [facility] due to hypoglycemia: CBG in 40s, BP 53/38 mmHg. Pt admitted to ICU in septic shock. PMH: hypotension on midodrine , HTN, HLD, CKD3, metastatic cancer. At baseline pt is minimally mobile, uses a WC, recent return to home from rehab.    PT Comments  Continued to work on baseline transfer needs with patient. Pt continues to be unable to perform at the same degree of independence of effort as his baseline. We explore alternative techniques today, a pull to stand seeming somewhat more independent, but not a realistic option for all setups in the home. Pt still requiring a heavy min to modA for basic transfers, sisters have expressed some concern whether they can provide this upon DC. Pt still appropriate for STR at DC to regain independence for transfers for return to home.    If plan is discharge home, recommend the following: A lot of help with bathing/dressing/bathroom;Two people to help with walking and/or transfers;Assist for transportation;Assistance with cooking/housework;Help with stairs or ramp for entrance   Can travel by private vehicle     No  Equipment Recommendations  Hoyer lift    Recommendations for Other Services       Precautions / Restrictions Precautions Precautions: Fall Recall of Precautions/Restrictions: Intact     Mobility  Bed Mobility                    Transfers   Equipment used: Rolling walker (2 wheels) Transfers: Bed to chair/wheelchair/BSC Sit to Stand: Mod assist Stand pivot transfers: Contact guard assist              Ambulation/Gait                   Stairs             Wheelchair Mobility     Tilt Bed    Modified Rankin (Stroke Patients Only)       Balance                                             Communication    Cognition                                        Cueing    Exercises Other Exercises Other Exercises: Step pivot transfer from recliner to WC with RW Other Exercises: STS transfer from [facility] (2 hand pull on bed rail and minA) Other Exercises: Step pivot transfer WC to recliner (using bed rail to pull self to standind with minA)    General Comments        Pertinent Vitals/Pain Pain Assessment Pain Assessment: No/denies pain    Home Living                          Prior Function            PT Goals (current goals can now be found in the care plan section) Acute Rehab PT Goals Patient Stated Goal: avoid rehab placement at DC PT Goal Formulation: With patient Time For Goal  Achievement: 10/12/24 Potential to Achieve Goals: Fair Progress towards PT goals: Not progressing toward goals - comment    Frequency    Min 2X/week      PT Plan      Co-evaluation              AM-PAC PT 6 Clicks Mobility   Outcome Measure  Help needed turning from your back to your side while in a flat bed without using bedrails?: A Lot Help needed moving from lying on your back to sitting on the side of a flat bed without using bedrails?: A Lot Help needed moving to and from a bed to a chair (including a wheelchair)?: A Lot Help needed standing up from a chair using your arms (e.g., wheelchair or bedside chair)?: A Lot Help needed to walk in hospital room?: A Lot Help needed climbing 3-5 steps with a railing? : A Lot 6 Click Score: 12    End of Session   Activity Tolerance: Patient limited by fatigue;No increased pain Patient left: in chair;with call bell/phone within reach Nurse Communication: Mobility status PT Visit Diagnosis: Unsteadiness on feet (R26.81);Other abnormalities of gait and mobility (R26.89);Muscle weakness (generalized) (M62.81)     Time: 8959-8887 PT Time Calculation (min) (ACUTE  ONLY): 32 min  Charges:    $Therapeutic Activity: 8-22 mins $Self Care/Home Management: 8-22 PT General Charges $$ ACUTE PT VISIT: 1 Visit                    12:37 PM, 09/30/24 [name], PT, DPT Physical Therapist - [facility]  [phone] (ASCOM)    Teresa Nicodemus C 09/30/2024, 12:34 PM

## 2024-09-30 NOTE — Progress Notes (Signed)
 " [name]: 08-Sep-1950 DOA: 09/21/2024 PCP: Adina Buel HERO, MD  142A/142A-AA  LOS: 9 days   Brief hospital course:   Assessment & Plan: [name] is a 74 year old male with a past medical history significant for hypotension on midodrine , former HTN, HLD, CKD Stage III, bladder incontinence and metastatic prostate cancer who presented to [facility] ED on 1/21 from an outpatient podiatry appointment for evaluation of hypotension.  Upon arriving the hospital, patient was diagnosed with a septic shock, was placed on Levophed  in ICU.  Source of infection was thought to be secondary to left heel ulceration with infection.  Initially treated with cefepime  and vancomycin , changed to Zyvox . Patient was seen by podiatry, MRI of the foot did not show any evidence of osteomyelitis. Patient is doing better, pressor was weaned off on 1/25, transferred to medical floor on 1/26. Patient also has history of peripheral vascular disease, vascular surgery to perform angiogram on 1/28.   Septic shock secondary to left heel wound infection. UTI ruled out. Reviewed chart, patient had a fever while in the hospital, also had hypothermia.  And leukopenia.  he also had acute kidney injury, severe metabolic acidosis with a lactic acid level more than 9.0.  He was also requiring pressor for 3 days.  This is consistent with septic shock, the source of infection thought to be secondary to left heel wound. Blood culture negative, wound culture was not sent out.  Urine culture also negative. --taper down midodrine  to 5 mg TID  Left heel wound --d/c'ed linezolid   --wound care per order --outpt f/u with podiatry   PAD? --angiogram neg for focal stenosis  Acute metabolic cephalopathy.   Not toxic encephalopathy Patient had a significant confusion on time admission, condition has so far improved.   Acute kidney injury secondary to septic shock. Chronic kidney disease  stage III rule out. --AKI resolved --oral hydration now  Severe metabolic acidosis with a lactic acidosis. --both resolved  Hypomagnesemia. Hypophosphatemia. Hypokalemia. --monitor and supplement PRN  Hyponatremia, resolved  Recent fall with traumatic rhabdomyolysis. CK level has normalized   Anemia of chronic disease. Patient had a recent iron  study, B12 and folic acid, did not show any deficiency. Anemia appears to be secondary to chronic disease.   --monitor Hgb   Type 2 diabetes. --recent A1c 5.6.  BG's have been mostly within inpatient goals. --no need for BG checks   Elevated troponin secondary to demand ischemia No need for additional workup.   DVT prophylaxis: Lovenox  SQ Code Status: Full code  Family Communication:  Level of care: Telemetry Dispo:   The patient is from: home Anticipated d/c is to: home Anticipated d/c date is: next Monday   Subjective and Interval History:  Rec changed back to SNF rehab, however, pt does not have insurance days left.     Objective: Vitals:   09/30/24 0556 09/30/24 0849 09/30/24 1425 09/30/24 2005  BP: 105/60 (!) 110/53 (!) 114/59 (!) 112/57  Pulse: 72 62 78 80  Resp: 17 16 16 18   Temp: 98 F (36.7 C) 98.3 F (36.8 C) 99.7 F (37.6 C) 98.2 F (36.8 C)  TempSrc:      SpO2: 99% 99% 100% 100%  Weight:      Height:        Intake/Output Summary (Last 24 hours) at 09/30/2024 2117 Last data filed at 09/30/2024 1759 Gross per 24 hour  Intake 700 ml  Output 1050 ml  Net -350 ml   Filed Weights   09/28/24 0500 09/29/24 0307 09/30/24 0500  Weight: 109.8 kg 114 kg 114.4 kg    Examination:   Constitutional: NAD, alert, oriented HEENT: conjunctivae and lids normal, EOMI CV: No cyanosis.   RESP: normal respiratory effort, on RA Neuro: II - XII grossly intact.   Psych: Normal mood and affect.  Appropriate judgement and reason   Data Reviewed: I have personally reviewed labs and imaging studies  Time spent: 35  minutes  Ellouise Haber, MD [facility] If 7PM-7AM, please contact night-coverage 09/30/2024, 9:17 PM   "

## 2024-10-01 LAB — CK: Total CK: 36 U/L — ABNORMAL LOW (ref 49–397)

## 2024-10-01 MED ORDER — ACETAMINOPHEN 500 MG PO TABS
1000.0000 mg | ORAL_TABLET | Freq: Three times a day (TID) | ORAL | Status: DC | PRN
  Administered 2024-10-01: 1000 mg via ORAL
  Filled 2024-10-01: qty 2

## 2024-10-01 NOTE — Progress Notes (Signed)
 " PROGRESS NOTE    [name]  FMW:[id number] DOB: September 05, 1950 DOA: 09/21/2024 PCP: Adina Buel HERO, MD  142A/142A-AA  LOS: 10 days   Brief hospital course:   Assessment & Plan: [name] is a 74 year old male with a past medical history significant for hypotension on midodrine , former HTN, HLD, CKD Stage III, bladder incontinence and metastatic prostate cancer who presented to [facility] ED on 1/21 from an outpatient podiatry appointment for evaluation of hypotension.  Upon arriving the hospital, patient was diagnosed with a septic shock, was placed on Levophed  in ICU.  Source of infection was thought to be secondary to left heel ulceration with infection.  Initially treated with cefepime  and vancomycin , changed to Zyvox . Patient was seen by podiatry, MRI of the foot did not show any evidence of osteomyelitis. Patient is doing better, pressor was weaned off on 1/25, transferred to medical floor on 1/26. Patient also has history of peripheral vascular disease, vascular surgery to perform angiogram on 1/28.   Septic shock secondary to left heel wound infection. UTI ruled out. Reviewed chart, patient had a fever while in the hospital, also had hypothermia.  And leukopenia.  he also had acute kidney injury, severe metabolic acidosis with a lactic acid level more than 9.0.  He was also requiring pressor for 3 days.  This is consistent with septic shock, the source of infection thought to be secondary to left heel wound. Blood culture negative, wound culture was not sent out.  Urine culture also negative. --taper down midodrine  to 5 mg TID  Left heel wound --d/c'ed linezolid   --wound care per order --outpt f/u with podiatry   PAD? --angiogram neg for focal stenosis  Acute metabolic cephalopathy.   Not toxic encephalopathy Patient had a significant confusion on time admission, condition has so far improved.   Acute kidney injury secondary to septic shock. Chronic kidney disease  stage III rule out. --AKI resolved --oral hydration now  Severe metabolic acidosis with a lactic acidosis. --both resolved  Hypomagnesemia. Hypophosphatemia. Hypokalemia. --monitor and supplement PRN  Hyponatremia, resolved  Recent fall with traumatic rhabdomyolysis. CK level has normalized   Anemia of chronic disease. Patient had a recent iron  study, B12 and folic acid, did not show any deficiency. Anemia appears to be secondary to chronic disease.   --monitor Hgb   Type 2 diabetes. --recent A1c 5.6.  BG's have been mostly within inpatient goals. --no need for BG checks   Elevated troponin secondary to demand ischemia No need for additional workup.   DVT prophylaxis: Lovenox  SQ Code Status: Full code  Family Communication:  Level of care: Telemetry Dispo:   The patient is from: home Anticipated d/c is to: home Anticipated d/c date is: next Monday   Subjective and Interval History:  Pt practiced transfer and walked some with mobility specialist today.   Objective: Vitals:   10/01/24 0341 10/01/24 0500 10/01/24 0845 10/01/24 1627  BP: (!) 91/52  (!) 92/55 (!) 100/54  Pulse: 73  87 86  Resp: 18  16 20   Temp: 98.6 F (37 C)  97.9 F (36.6 C) 97.9 F (36.6 C)  TempSrc:      SpO2: 98%  100% 100%  Weight:  113.2 kg    Height:        Intake/Output Summary (Last 24 hours) at 10/01/2024 1900 Last data filed at 10/01/2024 1830 Gross per 24 hour  Intake 480 ml  Output 1075 ml  Net -595 ml   [employer]  09/29/24 0307 09/30/24 0500 10/01/24 0500  Weight: 114 kg 114.4 kg 113.2 kg    Examination:   Constitutional: NAD, alert, oriented to person and place HEENT: conjunctivae and lids normal CV: No cyanosis.   RESP: normal respiratory effort, on RA Neuro: II - XII grossly intact.   Psych: Normal mood and affect.     Data Reviewed: I have personally reviewed labs and imaging studies  Time spent: 25 minutes  Ellouise Haber, MD [facility] If  7PM-7AM, please contact night-coverage 10/01/2024, 7:00 PM   "

## 2024-10-01 NOTE — Plan of Care (Signed)

## 2024-10-02 LAB — CBC
HCT: 23.5 % — ABNORMAL LOW (ref 39.0–52.0)
Hemoglobin: 7.7 g/dL — ABNORMAL LOW (ref 13.0–17.0)
MCH: 30.6 pg (ref 26.0–34.0)
MCHC: 32.8 g/dL (ref 30.0–36.0)
MCV: 93.3 fL (ref 80.0–100.0)
Platelets: 249 10*3/uL (ref 150–400)
RBC: 2.52 MIL/uL — ABNORMAL LOW (ref 4.22–5.81)
RDW: 17.8 % — ABNORMAL HIGH (ref 11.5–15.5)
WBC: 3 10*3/uL — ABNORMAL LOW (ref 4.0–10.5)
nRBC: 0 % (ref 0.0–0.2)

## 2024-10-02 MED ORDER — ENSURE PLUS HIGH PROTEIN PO LIQD: 237.0000 mL | Freq: Two times a day (BID) | ORAL | Status: AC

## 2024-10-02 MED ORDER — ADULT MULTIVITAMIN W/MINERALS CH: 1.0000 | ORAL_TABLET | Freq: Every day | ORAL | Status: AC

## 2024-10-02 MED ORDER — HYDROCERIN EX CREA: 1.0000 | TOPICAL_CREAM | Freq: Two times a day (BID) | CUTANEOUS | Status: AC

## 2024-10-02 MED ORDER — MIDODRINE HCL 5 MG PO TABS: 5.0000 mg | ORAL_TABLET | Freq: Three times a day (TID) | ORAL | 0 refills | Status: DC

## 2024-10-02 NOTE — Plan of Care (Signed)
" °  Problem: Coping: Goal: Ability to adjust to condition or change in health will improve Outcome: Progressing   Problem: Clinical Measurements: Goal: Diagnostic test results will improve Outcome: Progressing   Problem: Activity: Goal: Risk for activity intolerance will decrease Outcome: Progressing   Problem: Pain Managment: Goal: General experience of comfort will improve and/or be controlled Outcome: Progressing   Problem: Safety: Goal: Ability to remain free from injury will improve Outcome: Progressing   "

## 2024-10-02 NOTE — TOC Progression Note (Signed)
 Transition of Care [facility]) - Progression Note    Patient Details  Name: [name] MRN: [id number] Date of Birth: 01/16/1951  Transition of [facility]) CM/SW Contact  Lorraine LILLETTE [place], [place] Phone Number: 10/02/2024, 12:11 PM  Clinical Narrative:    Pt DC ready, [facility] is HHPT provider. Pt needs transportation to DC home. CSW contacted - sister, she is at his place and staying with him- so good for return today. She did share the ramp to access his place is loaded with snow- She  cannot move the snow.  CSW shared info with management as transportation options being reviewed. ICM following.      Barriers to Discharge: Transportation               Expected Discharge Plan and Services                                               Social Drivers of Health (SDOH) Interventions SDOH Screenings   Food Insecurity: No Food Insecurity (09/22/2024)  Housing: High Risk (09/22/2024)  Transportation Needs: No Transportation Needs (09/22/2024)  Utilities: Not At Risk (09/22/2024)  Depression (PHQ2-9): Low Risk (09/13/2024)  Financial Resource Strain: Low Risk  (08/28/2023)   Received from [facility]  Recent Concern: Financial Resource Strain - Medium Risk (08/06/2023)   Received from [facility]  Physical Activity: Inactive (08/28/2023)   Received from [facility]  Social Connections: Unknown (09/22/2024)  Stress: No Stress Concern Present (08/28/2023)   Received from [facility]  Tobacco Use: Medium Risk (09/21/2024)  Health Literacy: Adequate Health Literacy (08/28/2023)   Received from [facility]    Readmission Risk Interventions    09/24/2024   10:11 AM  Readmission Risk Prevention Plan  Transportation Screening Complete  Medication Review (RN Care Manager) Complete  PCP or Specialist appointment within 3-5 days of discharge Complete  HRI or Home Care Consult Complete   SW Recovery Care/Counseling Consult Complete  Palliative Care Screening Not Applicable  Skilled Nursing Facility Complete

## 2024-10-02 NOTE — Discharge Summary (Incomplete)
 "  Physician Discharge Summary   [name]  male DOB: 08-28-1951  FMW:[id number]  PCP: Adina Buel HERO, MD  Admit date: 09/21/2024 Discharge date: ***  Admitted From: home Disposition:  home.  Rec SNF rehab, but no insurance days left. Home Health: Yes CODE STATUS: Full code  Discharge Instructions     Discharge wound care:   Complete by: As directed    Right leg: Apply Eucerin twice a day. No dressing is needed.  Right foot: Cleanse with Vashe #765704, not rinse, allow to dry. Apply Eucerin to callus and intact skin. Apply Xeroform to the wound bed. Wrap with Kerlix and ACE wrap. Change daily.    L foot - outer left eschar: Cleanse with Vashe #765704, not rinse, allow to dry. Top with foam dressing, change every 3 days or as needed.  L heel: Cleanse with Vashe #765704, not rinse, allow to dry. Apply Xeroform to the wound bed changing daily. Top with foam dressing, changing every 3 days or as needed.   Right back - partial thickness: Cleanse with Vashe #765704, not rinse, allow to dry. Top with foam dressing, changing every 3 days or as needed.  PI left ischium/buttocks Stage 3; Right thigh and Right buttock -  partial thickness. Right trochanter DTPI:  Cleanse with Vashe #765704, not rinse, allow to dry. Apply Xeroform to the wound bed changing daily. Top with foam dressing, changing every 3 days or as needed.     Sacrum: Cleanse with saline, pat dry. Apply a single layer of Xeroform to the wound bed daily. Top with sacrum foam dressing. Change every 3 days if not saturated or soiled. Ok to lift the foam, change the Xeroform, and reapply. [facility] Course:  For full details, please see H&P, progress notes, consult notes and ancillary notes.  Briefly,  *** Not taking prednisone  at home.  Unless noted above, medications under STOP list are ones pt was not taking PTA.  Discharge Diagnoses:  Principal Problem:   Septic shock (HCC) Active Problems:    Anemia of chronic disease   Heel ulcer (HCC)   Infected ulcer of skin (HCC)   PAD (peripheral artery disease)   AKI (acute kidney injury)   Hypomagnesemia   Pressure injury of skin   Hypophosphatemia   Traumatic rhabdomyolysis   30 Day Unplanned Readmission Risk Score    Flowsheet Row ED to Hosp-Admission (Current) from 09/21/2024 in [facility] (1A)  30 Day Unplanned Readmission Risk Score (%) 36.74 Filed at 10/02/2024 1200    This score is the patient's risk of an unplanned readmission within 30 days of being discharged (0 -100%). The score is based on dignosis, age, lab data, medications, orders, and past utilization.   Low:  0-14.9   Medium: 15-21.9   High: 22-29.9   Extreme: 30 and above         Discharge Instructions:  Allergies as of 10/02/2024       Reactions   Sulfa Antibiotics Rash   Piperacillin -tazobactam In Dex         Medication List     STOP taking these medications    LORazepam  1 MG tablet Commonly known as: ATIVAN    predniSONE  20 MG tablet Commonly known as: DELTASONE        TAKE these medications    acetaminophen  325 MG tablet Commonly known as: TYLENOL  Take 2 tablets (650 mg total) by mouth every 6 (six) hours as needed for  mild pain (pain score 1-3) or fever (or Fever >/= 101).   ascorbic Acid  500 MG Cpcr Commonly known as: VITAMIN C  Take 500 mg by mouth daily.   aspirin  EC 81 MG tablet Take 81 mg by mouth daily. Swallow whole.   atorvastatin  10 MG tablet Commonly known as: LIPITOR Take 10 mg by mouth every evening.   collagenase  250 UNIT/GM ointment Commonly known as: SANTYL  Apply topically daily. To wound / pressure ulcers   cyanocobalamin  1000 MCG tablet Take 1 tablet (1,000 mcg total) by mouth daily.   feeding supplement Liqd Take 237 mLs by mouth 2 (two) times daily between meals.   hydrocerin Crea Apply 1 Application topically 2 (two) times daily.   loperamide  2 MG capsule Commonly  known as: IMODIUM  Take 2 capsules (4 mg total) by mouth every 8 (eight) hours as needed for diarrhea or loose stools.   metFORMIN  1000 MG tablet Commonly known as: GLUCOPHAGE  Take 1,000 mg by mouth 2 (two) times daily with a meal.   midodrine  5 MG tablet Commonly known as: PROAMATINE  Take 1 tablet (5 mg total) by mouth 3 (three) times daily with meals.   multivitamin with minerals Tabs tablet Take 1 tablet by mouth daily. Start taking on: October 03, 2024   ondansetron  4 MG tablet Commonly known as: ZOFRAN  Take 1 tablet (4 mg total) by mouth every 6 (six) hours as needed for nausea.   Tradjenta  5 MG Tabs tablet Generic drug: linagliptin  Take 5 mg by mouth daily.   Xtandi  40 MG tablet Generic drug: enzalutamide  Take 4 tablets (160 mg total) by mouth daily.   Zinc  220 (50 Zn) MG Caps Take 220 mg by mouth daily.               Discharge Care Instructions  (From admission, onward)           Start     Ordered   10/02/24 0000  Discharge wound care:       Comments: Right leg: Apply Eucerin twice a day. No dressing is needed.  Right foot: Cleanse with Vashe #765704, not rinse, allow to dry. Apply Eucerin to callus and intact skin. Apply Xeroform to the wound bed. Wrap with Kerlix and ACE wrap. Change daily.    L foot - outer left eschar: Cleanse with Vashe #765704, not rinse, allow to dry. Top with foam dressing, change every 3 days or as needed.  L heel: Cleanse with Vashe #765704, not rinse, allow to dry. Apply Xeroform to the wound bed changing daily. Top with foam dressing, changing every 3 days or as needed.   Right back - partial thickness: Cleanse with Vashe #765704, not rinse, allow to dry. Top with foam dressing, changing every 3 days or as needed.  PI left ischium/buttocks Stage 3; Right thigh and Right buttock -  partial thickness. Right trochanter DTPI:  Cleanse with Vashe #765704, not rinse, allow to dry. Apply Xeroform to the wound bed changing daily.  Top with foam dressing, changing every 3 days or as needed.     Sacrum: Cleanse with saline, pat dry. Apply a single layer of Xeroform to the wound bed daily. Top with sacrum foam dressing. Change every 3 days if not saturated or soiled. Ok to lift the foam, change the Xeroform, and reapply. - -   10/02/24 1407             Contact information for follow-up providers     Ashley Soulier, DPM Follow up in  2 week(s).   Specialty: Podiatry Contact information: [address] [place] [address] [phone]         Adina Buel HERO, MD Follow up in 1 week(s).   Specialty: Family Medicine Contact information: [address] [place] [address] [phone]              Contact information for after-discharge care     Home Medical Care     [facility]) .   Service: Home Health Services Contact information: [address] [place]  [phone]                     Allergies[1]   The results of significant diagnostics from this hospitalization (including imaging, microbiology, ancillary and laboratory) are listed below for reference.   Consultations:   Procedures/Studies: PERIPHERAL VASCULAR CATHETERIZATION Result Date: 09/28/2024 See surgical note for result.  ECHOCARDIOGRAM COMPLETE Result Date: 09/23/2024    ECHOCARDIOGRAM REPORT   Patient Name:   SAIVON PROWSE Date of Exam: 09/23/2024 Medical Rec #:  [id number]         Height:       71.0 in Accession #:    [id number]        Weight:       228.6 lb Date of Birth:  1950-12-23        BSA:          2.232 m Patient Age:    73 years          BP:           106/54 mmHg Patient Gender: M                 HR:           80 bpm. Exam Location:  [facility] Procedure: 2D Echo (Both Spectral and Color Flow Doppler were utilized during            procedure). Indications:     shock  History:         Patient has prior history of Echocardiogram examinations, most                   recent 10/07/2016. Chronic kidney disease and PAD; Risk                  Factors:Diabetes, Hypertension and Dyslipidemia.  Sonographer:     Tinnie Barefoot RDCS Referring Phys:  [id number] LONELL KANDICE MOOSE Diagnosing Phys: Redell Cave MD  Sonographer Comments: Technically difficult study due to poor echo windows and suboptimal subcostal window. Image acquisition challenging due to respiratory motion. IMPRESSIONS  1. Left ventricular ejection fraction, by estimation, is 60 to 65%. The left ventricle has normal function. The left ventricle has no regional wall motion abnormalities. Left ventricular diastolic parameters were normal.  2. Right ventricular systolic function is normal. The right ventricular size is not well visualized.  3. The mitral valve is normal in structure. No evidence of mitral valve regurgitation.  4. The aortic valve was not well visualized. Aortic valve regurgitation is not visualized. FINDINGS  Left Ventricle: Left ventricular ejection fraction, by estimation, is 60 to 65%. The left ventricle has normal function. The left ventricle has no regional wall motion abnormalities. Definity  contrast agent was given IV to delineate the left ventricular  endocardial borders. The left ventricular internal cavity size was normal in size. There is no left ventricular hypertrophy. Left ventricular diastolic parameters were normal.  Right Ventricle: The right ventricular size is not well visualized. No increase in right ventricular wall thickness. Right ventricular systolic function is normal. Left Atrium: Left atrial size was normal in size. Right Atrium: Right atrial size was normal in size. Pericardium: There is no evidence of pericardial effusion. Mitral Valve: The mitral valve is normal in structure. No evidence of mitral valve regurgitation. Tricuspid Valve: The tricuspid valve is normal in structure. Tricuspid valve regurgitation is mild. Aortic Valve: The aortic valve was not well  visualized. Aortic valve regurgitation is not visualized. Pulmonic Valve: The pulmonic valve was not well visualized. Pulmonic valve regurgitation is not visualized. Aorta: The aortic root is normal in size and structure. Venous: The inferior vena cava was not well visualized. IAS/Shunts: No atrial level shunt detected by color flow Doppler.  LEFT VENTRICLE PLAX 2D LVIDd:         4.70 cm      Diastology LVIDs:         3.10 cm      LV e' medial:    7.94 cm/s LV PW:         1.00 cm      LV E/e' medial:  9.8 LV IVS:        1.00 cm      LV e' lateral:   10.10 cm/s LVOT diam:     2.10 cm      LV E/e' lateral: 7.7 LV SV:         73 LV SV Index:   33 LVOT Area:     3.46 cm LV IVRT:       95 msec  LV Volumes (MOD) LV vol d, MOD A2C: 114.0 ml LV vol d, MOD A4C: 79.8 ml LV vol s, MOD A2C: 25.5 ml LV vol s, MOD A4C: 31.9 ml LV SV MOD A2C:     88.5 ml LV SV MOD A4C:     79.8 ml LV SV MOD BP:      68.5 ml  PULMONARY VEINS Diastolic Velocity: 52.00 cm/s S/D Velocity:       1.70 Systolic Velocity:  86.20 cm/s LEFT ATRIUM             Index LA diam:        4.00 cm 1.79 cm/m LA Vol (A2C):   64.6 ml 28.94 ml/m LA Vol (A4C):   48.3 ml 21.64 ml/m LA Biplane Vol: 55.9 ml 25.05 ml/m  AORTIC VALVE LVOT Vmax:   97.50 cm/s LVOT Vmean:  65.500 cm/s LVOT VTI:    0.211 m  AORTA Ao Root diam: 3.70 cm MITRAL VALVE               TRICUSPID VALVE MV Area (PHT): 2.99 cm    TR Peak grad:   35.8 mmHg MV Decel Time: 254 msec    TR Vmax:        299.00 cm/s MR Peak grad: 89.9 mmHg MR Vmax:      474.00 cm/s  SHUNTS MV E velocity: 77.60 cm/s  Systemic VTI:  0.21 m MV A velocity: 74.90 cm/s  Systemic Diam: 2.10 cm MV E/A ratio:  1.04 Redell Cave MD Electronically signed by Redell Cave MD Signature Date/Time: 09/23/2024/5:36:31 PM    Final    MR FOOT LEFT WO CONTRAST Result Date: 09/23/2024 CLINICAL DATA:  Chronic diabetic foot ulcers. Concern for osteomyelitis. EXAM: MRI OF THE LEFT FOOT WITHOUT CONTRAST TECHNIQUE: Multiplanar,  multisequence MR imaging of the left foot was performed. No intravenous  contrast was administered. COMPARISON:  Left foot radiographs dated 09/22/2024. FINDINGS: Bones/Joint/Cartilage No marrow signal abnormality identified to suggest acute osteomyelitis. No acute fracture or dislocation. 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 No significant joint effusion. Ligaments Lateral ankle ligaments appear intact. Fibrosis within the deltoid ligament likely reflects sequela of prior sprain. Lisfranc ligament is intact. Muscles and Tendons Flexor, peroneal and extensor compartment tendons are intact. Mild tenosynovitis of the flexor hallucis longus at the level of the knot of Henry. Generalized atrophy of the musculature. Soft tissue Moderate-sized soft tissue wound/ulceration of the lateral heel. Suspected small ulceration of the lateral midfoot. No loculated fluid collection. IMPRESSION: 1. No evidence of acute osteomyelitis. 2. Moderate-sized soft tissue wound/ulceration of the lateral heel. Suspected small ulceration of the lateral midfoot. No loculated fluid collection. 3. Avascular necrosis of the navicular. 4. 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 Mild osteoarthritis of the hindfoot. 5. Mild tenosynovitis of the flexor hallucis longus tendon at the level of the knot of Henry. Electronically Signed   By: Harrietta Sherry M.D.   On: 09/23/2024 16:38   US  RENAL Result Date: 09/22/2024 CLINICAL DATA:  Acute on chronic renal failure. History of metastatic prostate cancer. EXAM: RENAL / URINARY TRACT ULTRASOUND COMPLETE COMPARISON:  PET-CT 06/01/2024 FINDINGS: Right Kidney: Renal measurements: 10.1 x 5.3 x 5.6 cm = volume: 156 mL. Echogenicity within normal limits. No mass or hydronephrosis visualized. 6.3 cm simple cyst over the mid pole. Left Kidney: Somewhat limited visualization of the left kidney due to body habitus and  overlying bowel gas. Renal measurements: 10.2 x 6.9 x 4.8 cm = volume: 177 mL. Echogenicity within normal limits. No mass or hydronephrosis visualized. Bladder: Appears normal for degree of bladder distention. Foley catheter in place. Other: None. IMPRESSION: Normal size kidneys without hydronephrosis. 6.3 cm simple cyst over the mid pole right kidney. Electronically Signed   By: Toribio Agreste M.D.   On: 09/22/2024 08:58   DG Foot 2 Views Right Result Date: 09/22/2024 CLINICAL DATA:  Evaluate for osteomyelitis. 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 No evidence of bone destruction to suggest osteomyelitis. Degenerative changes over the midfoot and hindfoot. Small inferior calcaneal spur. Dorsal talar beaking at the talonavicular joint unchanged. Small vessel atherosclerotic disease is present. No significant air within the soft tissues. Soft tissue swelling over the plantar surface of the forefoot. IMPRESSION: 1. No acute findings. No evidence of osteomyelitis. 2. 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 Electronically Signed   By: Toribio Agreste M.D.   On: 09/22/2024 08:11   DG Foot 2 Views Left Result Date: 09/22/2024 CLINICAL DATA:  Evaluate for osteomyelitis. EXAM: LEFT FOOT - 2 VIEW COMPARISON:  07/24/2024 FINDINGS: Mild degenerative changes over the first MTP joint. Mild degenerate change over the midfoot and hindfoot. Small inferior calcaneal spur is present. There is small vessel atherosclerosis. No evidence of bone destruction to suggest osteomyelitis. No acute fracture or dislocation. IMPRESSION: 1. No acute findings. No evidence of osteomyelitis. 2. Mild degenerative changes as described. Electronically Signed   By: Toribio Agreste M.D.   On: 09/22/2024 08:09   DG Chest Port 1 View Result Date: 09/22/2024 CLINICAL DATA:  Central line EXAM: PORTABLE CHEST 1 VIEW COMPARISON:  07/26/2024 FINDINGS:  Hypoventilatory changes with probable atelectasis at the left base. Stable cardiomediastinal silhouette. Right IJ central venous catheter tip at the cavoatrial region. No  pneumothorax IMPRESSION: Right IJ central venous catheter tip at the cavoatrial region. No pneumothorax. Hypoventilatory changes with probable atelectasis at the left base. Electronically Signed   By: Luke Bun M.D.   On: 09/22/2024 02:03      Labs: BNP (last 3 results) No results for input(s): BNP in the last 8760 hours. Basic Metabolic Panel: Recent Labs  Lab 09/26/24 0439 09/27/24 0512 09/28/24 1626 09/30/24 0642  NA 140 138 137  --   K 3.9 4.1 4.5  --   CL 105 104 102  --   CO2 27 25 23   --   GLUCOSE 99 130* 175*  --   BUN 6* 7* 7*  --   CREATININE 0.63 0.61 0.80  --   CALCIUM  8.4* 8.4* 8.6*  --   MG 1.7 1.6* 1.5* 1.7  PHOS 2.0* 2.2* 2.4* 2.8   Liver Function Tests: No results for input(s): AST, ALT, ALKPHOS, BILITOT, PROT, ALBUMIN  in the last 168 hours. No results for input(s): LIPASE, AMYLASE in the last 168 hours. No results for input(s): AMMONIA in the last 168 hours. CBC: Recent Labs  Lab 09/26/24 0439 09/27/24 0512 09/28/24 1626 10/02/24 0615  WBC 3.5* 2.9* 6.1 3.0*  NEUTROABS 2.6  --   --   --   HGB 8.5* 7.6* 9.4* 7.7*  HCT 26.1* 24.3* 29.2* 23.5*  MCV 91.6 94.6 93.0 93.3  PLT 244 205 221 249   Cardiac Enzymes: Recent Labs  Lab 09/27/24 0512 09/28/24 1626 09/29/24 0445 09/30/24 0642 10/01/24 0807  CKTOTAL 84 71 53 38* 36*   BNP: Invalid input(s): POCBNP CBG: Recent Labs  Lab 09/28/24 0018 09/28/24 0443 09/28/24 1033 09/28/24 1133 09/28/24 1641  GLUCAP 112* 130* 141* 132* 175*   D-Dimer No results for input(s): DDIMER in the last 72 hours. Hgb A1c No results for input(s): HGBA1C in the last 72 hours. Lipid Profile No results for input(s): CHOL, HDL, LDLCALC, TRIG, CHOLHDL, LDLDIRECT in the last 72 hours. Thyroid function  studies No results for input(s): TSH, T4TOTAL, T3FREE, THYROIDAB in the last 72 hours.  Invalid input(s): FREET3 Anemia work up No results for input(s): VITAMINB12, FOLATE, FERRITIN, TIBC, IRON , RETICCTPCT in the last 72 hours. Urinalysis    Component Value Date/Time   COLORURINE AMBER (A) 09/21/2024 1655   APPEARANCEUR CLOUDY (A) 09/21/2024 1655   LABSPEC 1.027 09/21/2024 1655   PHURINE 5.0 09/21/2024 1655   GLUCOSEU NEGATIVE 09/21/2024 1655   HGBUR SMALL (A) 09/21/2024 1655   BILIRUBINUR NEGATIVE 09/21/2024 1655   KETONESUR NEGATIVE 09/21/2024 1655   PROTEINUR >=300 (A) 09/21/2024 1655   NITRITE NEGATIVE 09/21/2024 1655   LEUKOCYTESUR MODERATE (A) 09/21/2024 1655   Sepsis Labs Recent Labs  Lab 09/26/24 0439 09/27/24 0512 09/28/24 1626 10/02/24 0615  WBC 3.5* 2.9* 6.1 3.0*   Microbiology Recent Results (from the past 240 hours)  C Difficile Quick Screen w PCR reflex     Status: None   Collection Time: 09/24/24 10:27 AM   Specimen: STOOL  Result Value Ref Range Status   C Diff antigen NEGATIVE NEGATIVE Final   C Diff toxin NEGATIVE NEGATIVE Final   C Diff interpretation No C. difficile detected.  Final    Comment: Performed at [facility], [address] [place], [place] [address]     Total time spend on discharging this patient, including the last patient exam, discussing the hospital stay, instructions for ongoing care as it relates to all pertinent caregivers, as well as preparing the medical discharge  records, prescriptions, and/or referrals as applicable, is *** minutes.    Ellouise Haber, MD  [facility] 10/02/2024, 2:08 PM       [1]  Allergies Allergen Reactions   Sulfa Antibiotics Rash   Piperacillin -Tazobactam In Dex    "

## 2024-10-02 NOTE — Progress Notes (Signed)
 Physical Therapy Treatment Patient Details Name: [name] MRN: [id number] DOB: 09-22-1950 Today's Date: 10/02/2024   History of Present Illness [name] is a 73yoM who comes to [facility] on 09/21/24 direct from [facility] due to hypoglycemia: CBG in 40s, BP 53/38 mmHg. Pt admitted to ICU in septic shock. PMH: hypotension on midodrine , HTN, HLD, CKD3, metastatic cancer. At baseline pt is minimally mobile, uses a WC, recent return to home from rehab.    PT Comments  Pt received in Semi-Fowler's position and agreeable to therapy.  Nursing requesting for therapist to assist in transfer to the recliner as patient was wanting to sit up for a little while.  Therapist chart reviewed and assisted in transfer training.  Patient demonstrates good strength when in standing and is able to mobilize well, however he does have difficulty with getting into standing position from bed.  Patient requires moderate assist +2 from therapist and nursing to stand.  Multiple pillows and chuck pad used in recliner for elevated use as well as for comfort due to patient reporting pain in buttock area from yesterday when he was sitting in the recliner.  Patient left with all needs met and call bell within reach.   If plan is discharge home, recommend the following: A lot of help with bathing/dressing/bathroom;Two people to help with walking and/or transfers;Assist for transportation;Assistance with cooking/housework;Help with stairs or ramp for entrance   Can travel by private vehicle     No  Equipment Recommendations  Hoyer lift    Recommendations for Other Services       Precautions / Restrictions Precautions Precautions: Fall Recall of Precautions/Restrictions: Intact Restrictions Weight Bearing Restrictions Per Provider Order: No     Mobility  Bed Mobility Overal bed mobility: Needs Assistance Bed Mobility: Supine to Sit     Supine to sit: Mod assist     General bed mobility comments: Pt utilized  therapist hand to pull up from to get into seated positino    Transfers Overall transfer level: Needs assistance Equipment used: Rolling walker (2 wheels) Transfers: Bed to chair/wheelchair/BSC Sit to Stand: Mod assist, +2 physical assistance Stand pivot transfers: Contact guard assist         General transfer comment: steps well, once in standing    Ambulation/Gait Ambulation/Gait assistance:  (pt does not AMB at baseline.)                 Stairs             Wheelchair Mobility     Tilt Bed    Modified Rankin (Stroke Patients Only)       Balance Overall balance assessment: Needs assistance Sitting-balance support: Feet supported, No upper extremity supported Sitting balance-Leahy Scale: Good   Postural control: Posterior lean Standing balance support: Reliant on assistive device for balance Standing balance-Leahy Scale: Fair                              Hotel Manager: No apparent difficulties  Cognition Arousal: Alert Behavior During Therapy: WFL for tasks assessed/performed                             Following commands: Intact      Cueing Cueing Techniques: Verbal cues  Exercises      General Comments        Pertinent Vitals/Pain Pain Assessment Pain Assessment: No/denies pain  Home Living                          Prior Function            PT Goals (current goals can now be found in the care plan section) Acute Rehab PT Goals Patient Stated Goal: avoid rehab placement at DC PT Goal Formulation: With patient Time For Goal Achievement: 10/12/24 Potential to Achieve Goals: Fair Progress towards PT goals: Not progressing toward goals - comment    Frequency    Min 2X/week      PT Plan      Co-evaluation              AM-PAC PT 6 Clicks Mobility   Outcome Measure  Help needed turning from your back to your side while in a flat bed without using  bedrails?: A Lot Help needed moving from lying on your back to sitting on the side of a flat bed without using bedrails?: A Lot Help needed moving to and from a bed to a chair (including a wheelchair)?: A Lot Help needed standing up from a chair using your arms (e.g., wheelchair or bedside chair)?: A Lot Help needed to walk in hospital room?: A Lot Help needed climbing 3-5 steps with a railing? : A Lot 6 Click Score: 12    End of Session   Activity Tolerance: No increased pain Patient left: in chair;with call bell/phone within reach Nurse Communication: Mobility status PT Visit Diagnosis: Unsteadiness on feet (R26.81);Other abnormalities of gait and mobility (R26.89);Muscle weakness (generalized) (M62.81)     Time: 8887-8876 PT Time Calculation (min) (ACUTE ONLY): 11 min  Charges:    $Therapeutic Activity: 8-22 mins PT General Charges $$ ACUTE PT VISIT: 1 Visit                     Fonda Simpers, PT, DPT Physical Therapist - [facility]  10/02/24, 2:06 PM

## 2024-10-02 NOTE — Progress Notes (Signed)
 Mobility Specialist Progress Note:    10/02/24 1458  Mobility  Activity Pivoted/transferred to/from [facility]  Level of Assistance Minimal assist, patient does 75% or more  Assistive Device Front wheel walker  Activity Response Tolerated well  Mobility visit 1 Mobility  Mobility Specialist Start Time (ACUTE ONLY) 1447  Mobility Specialist Stop Time (ACUTE ONLY) 1458  Mobility Specialist Time Calculation (min) (ACUTE ONLY) 11 min   RN requested assistance. Pt required MinA +2 to stand and transfer with RW. Tolerated well, asx throughout. Left pt in chair with alarm on and belongings in reach. All needs met.  Sherrilee Ditty Mobility Specialist Please contact via Special Educational Needs Teacher or  Rehab office at [phone]

## 2024-10-03 ENCOUNTER — Other Ambulatory Visit: Payer: Self-pay

## 2024-10-03 MED ORDER — MIDODRINE HCL 5 MG PO TABS
5.0000 mg | ORAL_TABLET | Freq: Three times a day (TID) | ORAL | 0 refills | Status: AC
  Filled 2024-10-03: qty 90, 30d supply, fill #0

## 2024-10-03 NOTE — Plan of Care (Signed)
" °  Problem: Nutritional: Goal: Maintenance of adequate nutrition will improve Outcome: Progressing   Problem: Clinical Measurements: Goal: Ability to maintain clinical measurements within normal limits will improve Outcome: Progressing Goal: Diagnostic test results will improve Outcome: Progressing   Problem: Activity: Goal: Risk for activity intolerance will decrease Outcome: Progressing   Problem: Elimination: Goal: Will not experience complications related to bowel motility Outcome: Progressing Goal: Will not experience complications related to urinary retention Outcome: Progressing   Problem: Safety: Goal: Ability to remain free from injury will improve Outcome: Progressing   "

## 2024-10-03 NOTE — Plan of Care (Signed)

## 2024-10-03 NOTE — Progress Notes (Signed)
 Physical Therapy Treatment Patient Details Name: [name] MRN: [id number] DOB: 12-15-1950 Today's Date: 10/03/2024   History of Present Illness [name] is a 73yoM who comes to [facility] on 09/21/24 direct from [facility] due to hypoglycemia: CBG in 40s, BP 53/38 mmHg. Pt admitted to ICU in septic shock. PMH: hypotension on midodrine , HTN, HLD, CKD3, metastatic cancer. At baseline pt is minimally mobile, uses a WC, recent return to home from rehab.    PT Comments  Pt received in Semi-Fowler's position and agreeable to therapy.  Patient again utilized handhold assist to come upright and sitting at the edge of the bed.  Patient utilized walker well and required mod assist to come up right into standing position.  Patient was able to perform mini squats at the chair before finally becoming too fatigued to continue and had to sit.  Patient unable to continue with gait training at this time, will continue to attempt this in future sessions.   If plan is discharge home, recommend the following: A lot of help with bathing/dressing/bathroom;Two people to help with walking and/or transfers;Assist for transportation;Assistance with cooking/housework;Help with stairs or ramp for entrance   Can travel by private vehicle     No  Equipment Recommendations  Hoyer lift    Recommendations for Other Services       Precautions / Restrictions Precautions Precautions: Fall Recall of Precautions/Restrictions: Intact Restrictions Weight Bearing Restrictions Per Provider Order: No     Mobility  Bed Mobility Overal bed mobility: Needs Assistance Bed Mobility: Supine to Sit     Supine to sit: Mod assist Sit to supine: Mod assist   General bed mobility comments: Pt utilized therapist hand to pull up from to get into seated position    Transfers Overall transfer level: Needs assistance Equipment used: Rolling walker (2 wheels) Transfers: Bed to chair/wheelchair/BSC Sit to Stand: Mod assist, +2  physical assistance Stand pivot transfers: Contact guard assist         General transfer comment: steps well once in standing    Ambulation/Gait Ambulation/Gait assistance:  (pt does not AMB at baseline.)             General Gait Details: pt fatigued after mobilizing to the chair and is unable to progress to gait at this time.   Stairs             Wheelchair Mobility     Tilt Bed    Modified Rankin (Stroke Patients Only)       Balance Overall balance assessment: Needs assistance Sitting-balance support: Feet supported, No upper extremity supported Sitting balance-Leahy Scale: Good   Postural control: Posterior lean Standing balance support: Reliant on assistive device for balance Standing balance-Leahy Scale: Fair                              Hotel Manager: No apparent difficulties  Cognition Arousal: Alert Behavior During Therapy: WFL for tasks assessed/performed                             Following commands: Intact      Cueing Cueing Techniques: Verbal cues  Exercises      General Comments        Pertinent Vitals/Pain Pain Assessment Pain Assessment: No/denies pain    Home Living  Prior Function            PT Goals (current goals can now be found in the care plan section) Acute Rehab PT Goals Patient Stated Goal: avoid rehab placement at DC PT Goal Formulation: With patient Time For Goal Achievement: 10/12/24 Potential to Achieve Goals: Fair Progress towards PT goals: Not progressing toward goals - comment    Frequency    Min 2X/week      PT Plan      Co-evaluation              AM-PAC PT 6 Clicks Mobility   Outcome Measure  Help needed turning from your back to your side while in a flat bed without using bedrails?: A Lot Help needed moving from lying on your back to sitting on the side of a flat bed without using  bedrails?: A Lot Help needed moving to and from a bed to a chair (including a wheelchair)?: A Lot   Help needed to walk in hospital room?: A Lot Help needed climbing 3-5 steps with a railing? : Total 6 Click Score: 9    End of Session Equipment Utilized During Treatment: Gait belt Activity Tolerance: No increased pain Patient left: in chair;with call bell/phone within reach Nurse Communication: Mobility status PT Visit Diagnosis: Unsteadiness on feet (R26.81);Other abnormalities of gait and mobility (R26.89);Muscle weakness (generalized) (M62.81)     Time: 9098-9088 PT Time Calculation (min) (ACUTE ONLY): 10 min  Charges:    $Therapeutic Activity: 8-22 mins PT General Charges $$ ACUTE PT VISIT: 1 Visit                     Fonda Simpers, PT, DPT Physical Therapist - [facility]  10/03/24, 12:43 PM

## 2024-10-04 ENCOUNTER — Other Ambulatory Visit: Payer: Self-pay

## 2024-10-07 ENCOUNTER — Other Ambulatory Visit: Payer: Self-pay

## 2024-10-07 NOTE — Progress Notes (Signed)
 Specialty Pharmacy Refill Coordination Note  [name] is a 74 y.o. male contacted today regarding refills of specialty medication(s) Enzalutamide  (XTANDI )   Patient requested No data recorded  Delivery date: 10/11/24   Verified address: [address]  [facility] [address]   Medication will be filled on: 10/10/24

## 2024-10-11 ENCOUNTER — Inpatient Hospital Stay

## 2024-10-11 ENCOUNTER — Inpatient Hospital Stay: Attending: Internal Medicine

## 2024-10-11 ENCOUNTER — Inpatient Hospital Stay: Admitting: Internal Medicine

## 2024-10-31 ENCOUNTER — Encounter: Admitting: Physician Assistant

## 2025-01-04 ENCOUNTER — Inpatient Hospital Stay: Admitting: Hospice and Palliative Medicine
